# Patient Record
Sex: Male | Born: 1940 | Race: White | Hispanic: No | Marital: Married | Smoking: Never smoker
Health system: Southern US, Community
[De-identification: ages and names within clinical notes are randomized; demographics above are authoritative.]

## PROBLEM LIST (undated history)

## (undated) DIAGNOSIS — J309 Allergic rhinitis, unspecified: Secondary | ICD-10-CM

## (undated) DIAGNOSIS — C4490 Unspecified malignant neoplasm of skin, unspecified: Secondary | ICD-10-CM

## (undated) DIAGNOSIS — M21379 Foot drop, unspecified foot: Secondary | ICD-10-CM

## (undated) DIAGNOSIS — N401 Enlarged prostate with lower urinary tract symptoms: Secondary | ICD-10-CM

## (undated) DIAGNOSIS — K759 Inflammatory liver disease, unspecified: Secondary | ICD-10-CM

## (undated) DIAGNOSIS — R0989 Other specified symptoms and signs involving the circulatory and respiratory systems: Secondary | ICD-10-CM

## (undated) DIAGNOSIS — M549 Dorsalgia, unspecified: Secondary | ICD-10-CM

## (undated) DIAGNOSIS — G479 Sleep disorder, unspecified: Secondary | ICD-10-CM

## (undated) DIAGNOSIS — F528 Other sexual dysfunction not due to a substance or known physiological condition: Secondary | ICD-10-CM

## (undated) DIAGNOSIS — C8590 Non-Hodgkin lymphoma, unspecified, unspecified site: Secondary | ICD-10-CM

## (undated) DIAGNOSIS — Q742 Other congenital malformations of lower limb(s), including pelvic girdle: Secondary | ICD-10-CM

## (undated) DIAGNOSIS — M25569 Pain in unspecified knee: Secondary | ICD-10-CM

## (undated) DIAGNOSIS — K219 Gastro-esophageal reflux disease without esophagitis: Secondary | ICD-10-CM

## (undated) DIAGNOSIS — R351 Nocturia: Secondary | ICD-10-CM

## (undated) DIAGNOSIS — R21 Rash and other nonspecific skin eruption: Secondary | ICD-10-CM

## (undated) HISTORY — DX: Gastro-esophageal reflux disease without esophagitis: K21.9

## (undated) HISTORY — PX: KNEE ARTHROSCOPY: SUR90

## (undated) HISTORY — PX: CATARACT EXTRACTION W/ INTRAOCULAR LENS  IMPLANT, BILATERAL: SHX1307

## (undated) HISTORY — DX: Other congenital malformations of lower limb(s), including pelvic girdle: Q74.2

## (undated) HISTORY — PX: TRANSURETHRAL RESECTION OF PROSTATE: SHX73

## (undated) HISTORY — PX: TONSILLECTOMY AND ADENOIDECTOMY: SUR1326

## (undated) HISTORY — DX: Allergic rhinitis, unspecified: J30.9

## (undated) HISTORY — DX: Pain in unspecified knee: M25.569

## (undated) HISTORY — DX: Other sexual dysfunction not due to a substance or known physiological condition: F52.8

## (undated) HISTORY — DX: Other specified symptoms and signs involving the circulatory and respiratory systems: R09.89

## (undated) HISTORY — DX: Dorsalgia, unspecified: M54.9

---

## 1975-07-07 DIAGNOSIS — K759 Inflammatory liver disease, unspecified: Secondary | ICD-10-CM

## 1975-07-07 HISTORY — DX: Inflammatory liver disease, unspecified: K75.9

## 1997-02-08 ENCOUNTER — Encounter: Payer: Self-pay | Admitting: Gastroenterology

## 1998-02-13 ENCOUNTER — Encounter: Payer: Self-pay | Admitting: Gastroenterology

## 1998-02-25 ENCOUNTER — Encounter: Payer: Self-pay | Admitting: Gastroenterology

## 1998-02-25 ENCOUNTER — Ambulatory Visit (HOSPITAL_COMMUNITY): Admission: RE | Admit: 1998-02-25 | Discharge: 1998-02-25 | Payer: Self-pay | Admitting: *Deleted

## 2001-06-07 DIAGNOSIS — C4491 Basal cell carcinoma of skin, unspecified: Secondary | ICD-10-CM

## 2001-06-07 HISTORY — DX: Basal cell carcinoma of skin, unspecified: C44.91

## 2003-07-31 DIAGNOSIS — C4492 Squamous cell carcinoma of skin, unspecified: Secondary | ICD-10-CM

## 2003-07-31 HISTORY — DX: Squamous cell carcinoma of skin, unspecified: C44.92

## 2004-03-13 ENCOUNTER — Ambulatory Visit (HOSPITAL_COMMUNITY): Admission: RE | Admit: 2004-03-13 | Discharge: 2004-03-13 | Payer: Self-pay | Admitting: Orthopedic Surgery

## 2004-03-13 ENCOUNTER — Ambulatory Visit (HOSPITAL_BASED_OUTPATIENT_CLINIC_OR_DEPARTMENT_OTHER): Admission: RE | Admit: 2004-03-13 | Discharge: 2004-03-13 | Payer: Self-pay | Admitting: Orthopedic Surgery

## 2004-03-13 ENCOUNTER — Observation Stay (HOSPITAL_COMMUNITY): Admission: AD | Admit: 2004-03-13 | Discharge: 2004-03-14 | Payer: Self-pay | Admitting: Orthopedic Surgery

## 2004-08-21 DIAGNOSIS — C4491 Basal cell carcinoma of skin, unspecified: Secondary | ICD-10-CM

## 2004-08-21 HISTORY — DX: Basal cell carcinoma of skin, unspecified: C44.91

## 2005-07-06 HISTORY — PX: SHOULDER OPEN ROTATOR CUFF REPAIR: SHX2407

## 2005-08-11 ENCOUNTER — Encounter: Payer: Self-pay | Admitting: Gastroenterology

## 2005-08-25 ENCOUNTER — Encounter: Payer: Self-pay | Admitting: Gastroenterology

## 2005-10-13 ENCOUNTER — Encounter: Payer: Self-pay | Admitting: Gastroenterology

## 2006-04-23 ENCOUNTER — Ambulatory Visit: Payer: Self-pay | Admitting: Family Medicine

## 2006-04-23 LAB — CONVERTED CEMR LAB
ALT: 22 units/L (ref 0–40)
Chol/HDL Ratio, serum: 5.5
Cholesterol: 208 mg/dL (ref 0–200)
Eosinophil percent: 3.4 % (ref 0.0–5.0)
GFR calc non Af Amer: 72 mL/min
Glomerular Filtration Rate, Af Am: 87 mL/min/{1.73_m2}
HCT: 47 % (ref 39.0–52.0)
HDL: 37.9 mg/dL — ABNORMAL LOW (ref >39.0)
Lymphocytes Relative: 28 % (ref 12.0–46.0)
MCHC: 33 g/dL (ref 30.0–36.0)
Monocytes Relative: 7.4 % (ref 3.0–11.0)
RDW: 12.4 % (ref 11.5–14.6)
Sodium: 142 meq/L (ref 135–145)
TSH: 3.72 microintl units/mL (ref 0.35–5.50)
Triglyceride fasting, serum: 128 mg/dL (ref 0–149)
VLDL: 26 mg/dL (ref 0–40)

## 2006-04-30 ENCOUNTER — Ambulatory Visit: Payer: Self-pay | Admitting: Family Medicine

## 2006-05-18 ENCOUNTER — Ambulatory Visit: Payer: Self-pay

## 2007-01-25 ENCOUNTER — Telehealth (INDEPENDENT_AMBULATORY_CARE_PROVIDER_SITE_OTHER): Payer: Self-pay | Admitting: *Deleted

## 2007-02-18 DIAGNOSIS — K219 Gastro-esophageal reflux disease without esophagitis: Secondary | ICD-10-CM | POA: Insufficient documentation

## 2007-02-18 DIAGNOSIS — R351 Nocturia: Secondary | ICD-10-CM

## 2007-02-18 DIAGNOSIS — N401 Enlarged prostate with lower urinary tract symptoms: Secondary | ICD-10-CM

## 2007-02-18 HISTORY — DX: Gastro-esophageal reflux disease without esophagitis: K21.9

## 2007-02-18 HISTORY — DX: Benign prostatic hyperplasia with lower urinary tract symptoms: N40.1

## 2007-08-03 ENCOUNTER — Ambulatory Visit: Payer: Self-pay | Admitting: Family Medicine

## 2007-08-03 DIAGNOSIS — Q742 Other congenital malformations of lower limb(s), including pelvic girdle: Secondary | ICD-10-CM

## 2007-08-03 DIAGNOSIS — J309 Allergic rhinitis, unspecified: Secondary | ICD-10-CM

## 2007-08-03 DIAGNOSIS — G4720 Circadian rhythm sleep disorder, unspecified type: Secondary | ICD-10-CM | POA: Insufficient documentation

## 2007-08-03 HISTORY — DX: Allergic rhinitis, unspecified: J30.9

## 2007-08-03 HISTORY — DX: Other congenital malformations of lower limb(s), including pelvic girdle: Q74.2

## 2007-08-03 LAB — CONVERTED CEMR LAB
ALT: 21 units/L (ref 0–53)
AST: 19 units/L (ref 0–37)
Alkaline Phosphatase: 59 units/L (ref 39–117)
Basophils Relative: 0.1 % (ref 0.0–1.0)
Chloride: 107 meq/L (ref 96–112)
Cholesterol: 174 mg/dL (ref 0–200)
Creatinine, Ser: 1 mg/dL (ref 0.4–1.5)
Eosinophils Absolute: 0.3 10*3/uL (ref 0.0–0.6)
Eosinophils Relative: 4.6 % (ref 0.0–5.0)
GFR calc non Af Amer: 80 mL/min
HDL: 32.6 mg/dL — ABNORMAL LOW (ref >39.0)
LDL Cholesterol: 122 mg/dL — ABNORMAL HIGH (ref 0–99)
Lymphocytes Relative: 27 % (ref 12.0–46.0)
MCHC: 34.1 g/dL (ref 30.0–36.0)
Monocytes Absolute: 0.4 10*3/uL (ref 0.2–0.7)
Monocytes Relative: 7.6 % (ref 3.0–11.0)
Neutro Abs: 3.4 10*3/uL (ref 1.4–7.7)
Platelets: 119 10*3/uL — ABNORMAL LOW (ref 150–400)
RDW: 12.6 % (ref 11.5–14.6)
Sodium: 142 meq/L (ref 135–145)
TSH: 5.56 microintl units/mL — ABNORMAL HIGH (ref 0.35–5.50)
Total Protein: 5.9 g/dL — ABNORMAL LOW (ref 6.0–8.3)
Triglycerides: 96 mg/dL (ref 0–149)
VLDL: 19 mg/dL (ref 0–40)

## 2007-09-28 ENCOUNTER — Telehealth (INDEPENDENT_AMBULATORY_CARE_PROVIDER_SITE_OTHER): Payer: Self-pay | Admitting: *Deleted

## 2007-11-23 DIAGNOSIS — C4492 Squamous cell carcinoma of skin, unspecified: Secondary | ICD-10-CM

## 2007-11-23 HISTORY — DX: Squamous cell carcinoma of skin, unspecified: C44.92

## 2008-05-08 ENCOUNTER — Telehealth: Payer: Self-pay | Admitting: Family Medicine

## 2008-07-30 ENCOUNTER — Telehealth: Payer: Self-pay | Admitting: Family Medicine

## 2008-08-07 ENCOUNTER — Ambulatory Visit: Payer: Self-pay | Admitting: Family Medicine

## 2008-08-07 DIAGNOSIS — F528 Other sexual dysfunction not due to a substance or known physiological condition: Secondary | ICD-10-CM

## 2008-08-07 DIAGNOSIS — N529 Male erectile dysfunction, unspecified: Secondary | ICD-10-CM | POA: Insufficient documentation

## 2008-08-07 DIAGNOSIS — M25569 Pain in unspecified knee: Secondary | ICD-10-CM

## 2008-08-07 DIAGNOSIS — R0989 Other specified symptoms and signs involving the circulatory and respiratory systems: Secondary | ICD-10-CM

## 2008-08-07 HISTORY — DX: Other specified symptoms and signs involving the circulatory and respiratory systems: R09.89

## 2008-08-07 HISTORY — DX: Other sexual dysfunction not due to a substance or known physiological condition: F52.8

## 2008-08-07 HISTORY — DX: Pain in unspecified knee: M25.569

## 2008-08-17 ENCOUNTER — Ambulatory Visit (HOSPITAL_BASED_OUTPATIENT_CLINIC_OR_DEPARTMENT_OTHER): Admission: RE | Admit: 2008-08-17 | Discharge: 2008-08-17 | Payer: Self-pay | Admitting: Urology

## 2008-08-28 ENCOUNTER — Ambulatory Visit: Payer: Self-pay

## 2009-01-21 ENCOUNTER — Telehealth: Payer: Self-pay | Admitting: Family Medicine

## 2009-04-05 ENCOUNTER — Telehealth (INDEPENDENT_AMBULATORY_CARE_PROVIDER_SITE_OTHER): Payer: Self-pay | Admitting: *Deleted

## 2009-05-28 ENCOUNTER — Telehealth: Payer: Self-pay | Admitting: Family Medicine

## 2009-05-29 ENCOUNTER — Ambulatory Visit: Payer: Self-pay | Admitting: Family Medicine

## 2009-05-29 DIAGNOSIS — J069 Acute upper respiratory infection, unspecified: Secondary | ICD-10-CM | POA: Insufficient documentation

## 2009-07-15 ENCOUNTER — Ambulatory Visit: Payer: Self-pay | Admitting: Family Medicine

## 2009-07-15 DIAGNOSIS — J019 Acute sinusitis, unspecified: Secondary | ICD-10-CM | POA: Insufficient documentation

## 2009-07-17 ENCOUNTER — Telehealth: Payer: Self-pay | Admitting: Family Medicine

## 2009-08-09 ENCOUNTER — Ambulatory Visit: Payer: Self-pay | Admitting: Family Medicine

## 2009-09-18 ENCOUNTER — Ambulatory Visit: Payer: Self-pay | Admitting: Family Medicine

## 2009-09-18 DIAGNOSIS — M549 Dorsalgia, unspecified: Secondary | ICD-10-CM

## 2009-09-18 DIAGNOSIS — R1011 Right upper quadrant pain: Secondary | ICD-10-CM

## 2009-09-18 HISTORY — DX: Dorsalgia, unspecified: M54.9

## 2009-09-23 ENCOUNTER — Encounter: Admission: RE | Admit: 2009-09-23 | Discharge: 2009-09-23 | Payer: Self-pay | Admitting: Family Medicine

## 2009-09-23 ENCOUNTER — Telehealth: Payer: Self-pay | Admitting: *Deleted

## 2009-09-30 ENCOUNTER — Telehealth: Payer: Self-pay | Admitting: Family Medicine

## 2009-10-29 ENCOUNTER — Telehealth: Payer: Self-pay | Admitting: Family Medicine

## 2009-11-11 ENCOUNTER — Ambulatory Visit: Payer: Self-pay | Admitting: Family Medicine

## 2010-02-05 ENCOUNTER — Telehealth: Payer: Self-pay | Admitting: Family Medicine

## 2010-03-21 ENCOUNTER — Telehealth: Payer: Self-pay | Admitting: *Deleted

## 2010-05-01 ENCOUNTER — Telehealth: Payer: Self-pay | Admitting: Family Medicine

## 2010-07-16 ENCOUNTER — Encounter: Payer: Self-pay | Admitting: Family Medicine

## 2010-08-03 LAB — CONVERTED CEMR LAB
ALT: 21 units/L (ref 0–53)
ALT: 24 units/L (ref 0–53)
Albumin: 3.8 g/dL (ref 3.5–5.2)
Alkaline Phosphatase: 57 units/L (ref 39–117)
BUN: 10 mg/dL (ref 6–23)
Basophils Absolute: 0 10*3/uL (ref 0.0–0.1)
CO2: 27 meq/L (ref 19–32)
Calcium: 9.1 mg/dL (ref 8.4–10.5)
Chloride: 107 meq/L (ref 96–112)
Cholesterol: 170 mg/dL (ref 0–200)
Cholesterol: 189 mg/dL (ref 0–200)
Creatinine, Ser: 1.2 mg/dL (ref 0.4–1.5)
Creatinine, Ser: 1.3 mg/dL (ref 0.4–1.5)
Eosinophils Absolute: 0.3 10*3/uL (ref 0.0–0.7)
Eosinophils Relative: 2.8 % (ref 0.0–5.0)
Eosinophils Relative: 6 % — ABNORMAL HIGH (ref 0.0–5.0)
GFR calc Af Amer: 71 mL/min
GFR calc non Af Amer: 59 mL/min
Glucose, Bld: 85 mg/dL (ref 70–99)
HCT: 47.5 % (ref 39.0–52.0)
Hemoglobin: 14.8 g/dL (ref 13.0–17.0)
LDL Cholesterol: 109 mg/dL — ABNORMAL HIGH (ref 0–99)
LDL Cholesterol: 124 mg/dL — ABNORMAL HIGH (ref 0–99)
Lymphs Abs: 1.2 10*3/uL (ref 0.7–4.0)
MCHC: 33.4 g/dL (ref 30.0–36.0)
MCHC: 34 g/dL (ref 30.0–36.0)
Monocytes Absolute: 0.4 10*3/uL (ref 0.1–1.0)
Monocytes Absolute: 0.5 10*3/uL (ref 0.1–1.0)
Monocytes Relative: 9.7 % (ref 3.0–12.0)
Neutro Abs: 2.8 10*3/uL (ref 1.4–7.7)
Neutrophils Relative %: 58.5 % (ref 43.0–77.0)
Neutrophils Relative %: 64.9 % (ref 43.0–77.0)
Platelets: 111 10*3/uL — ABNORMAL LOW (ref 150.0–400.0)
Potassium: 4.5 meq/L (ref 3.5–5.1)
RBC: 4.72 M/uL (ref 4.22–5.81)
RDW: 12 % (ref 11.5–14.6)
RDW: 12.2 % (ref 11.5–14.6)
Sodium: 143 meq/L (ref 135–145)
TSH: 4.43 microintl units/mL (ref 0.35–5.50)
Total Bilirubin: 1 mg/dL (ref 0.3–1.2)
Total Protein: 6.4 g/dL (ref 6.0–8.3)
Triglycerides: 104 mg/dL (ref 0.0–149.0)
WBC: 4.8 10*3/uL (ref 4.5–10.5)
WBC: 5.3 10*3/uL (ref 4.5–10.5)

## 2010-08-07 NOTE — Progress Notes (Signed)
Summary: Pt called Imaging results  Phone Note Call from Patient Call back at Home Phone [phone]   Caller: Patient Summary of Call: Pt called to get results from imaging that he had done. Please call asap.  Initial call taken by: Lucy Antigua,  September 23, 2009 3:58 PM  Follow-up for Phone Call        patient is aware Follow-up by: Kern Reap CMA [name]),  September 24, 2009 2:14 PM    Lumbar arthritis; Abdominal U/S normal

## 2010-08-07 NOTE — Progress Notes (Signed)
  Phone Note Outgoing Call   Summary of Call: I called the coach  Mosely to review the ultrasound pain.  He changed his diet and is relatively asymptomatic.  Advised to continue to avoid fried foods and fatty foods and if he continues to stay in asymptomatic.  Would do nothing further however, if he begins having symptoms will pursue this with HIDA scan.  GI consult, etc. Initial call taken by: Roderick Pee MD,  September 30, 2009 5:37 PM

## 2010-08-07 NOTE — Assessment & Plan Note (Signed)
Summary: back pain/abd pain/dm   Vital Signs:  Patient profile:   70 year old male Weight:      186 pounds Temp:     98.5 degrees F oral BP sitting:   120 / 80  (left arm) Cuff size:   regular  Vitals Entered By: Kern Reap CMA [name]) (September 18, 2009 9:39 AM)  Reason for Visit back pain and upper abd pain  History of Present Illness: Perry Nielsen is a 70 year old, married male, nonsmoker, who comes in today for evaluation of two problems.  Off-and-on for the past 8, years.  He's had right lower lumbar back pain.  He describes the discomfort as dull, comes and goes, it may last for minutes or hours, on a one to 10 scale is a two.  He denies any neurologic symptoms, and no trauma.  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  Otherwise, normal.  He exercises on a regular basis by swimming and playing golf.  If he takes 600 mg and Motrin.  It seems to help.  On occasion, when he swings, a golf club to get severe, sharp pain in his back, which is different from this type of pain.  Is also having some discomfort in his right upper quadrant.  His father and daughter both have had cholecystitis and have had their gallbladders removed.  He feels like this is separate from his back pain.  This is also episodic.  He's had no fever, chills, nausea, vomiting, diarrhea, etc.  Allergies: 1)  * Anthistamines  Past History:  Past medical, surgical, family and social histories (including risk factors) reviewed for relevance to current acute and chronic problems.  Past Medical History: Reviewed history from 08/03/2007 and no changes required. GERD Benign prostatic hypertrophy Pterygium, hx of Decreased hearing loss  rotator cuff surgery, right shoulder.  O7 allergic rhinitis chronic sleep dysfunction hammertoes left foot  Past Surgical History: Reviewed history from 05/29/2009 and no changes required. Rotator cuff repair Left knee signs  cart.  70 T&A Prostatectomy  Family History: Reviewed history from 02/18/2007 and no changes required. Fam hx MI Family History of Aneurysm Aortic Family History of Cardiovascular disorder Family History of Prostate CA 1st degree relative <50  Social History: Reviewed history from 02/18/2007 and no changes required. Retired Never Smoked Alcohol use-yes Drug use-no Regular exercise-yes Married  Review of Systems      See HPI  Physical Exam  General:  Well-developed,well-nourished,in no acute distress; alert,appropriate and cooperative throughout examination Abdomen:  Bowel sounds positive,abdomen soft and non-tender without masses, organomegaly or hernias noted. Msk:  No deformity or scoliosis noted of thoracic or lumbar spine.   Pulses:  R and L carotid,radial,femoral,dorsalis pedis and posterior tibial pulses are full and equal bilaterally Extremities:  No clubbing, cyanosis, edema, or deformity noted with normal full range of motion of all joints.   Neurologic:  No cranial nerve deficits noted. Station and gait are normal. Plantar reflexes are down-going bilaterally. DTRs are symmetrical throughout. Sensory, motor and coordinative functions appear intact.   Problems:  Medical Problems Added: 1)  Dx of Abdominal Pain, Right Upper Quadrant  (ICD-789.01) 2)  Dx of Back Pain  (ICD-724.5)  Impression & Recommendations:  Problem # 1:  ABDOMINAL PAIN, RIGHT UPPER QUADRANT (ICD-789.01) Assessment New  Orders: Radiology Referral (Radiology)  Problem # 2:  BACK PAIN (ICD-724.5) Assessment: Deteriorated  His updated medication list for this problem includes:  Adult Aspirin Ec Low Strength 81 Mg Tbec (Aspirin)  Orders: T-Lumbar Spine w/Flex & Ext 4 Views (16109UE)  Complete Medication List: 1)  Temazepam 30 Mg Caps (Temazepam) .... Take 1 tab by mouth at bedtime 2)  Glucosamine-chondroitin 1500-1200 Mg/52ml Liqd (Glucosamine-chondroitin) 3)  Multivital Tabs (Multiple  vitamins-minerals) 4)  Adult Aspirin Ec Low Strength 81 Mg Tbec (Aspirin) 5)  Fish Oil Oil (Fish oil) 6)  Metamucil 0.52 Gm Caps (Psyllium) .... ????dose 7)  Viagra 50 Mg Tabs (Sildenafil citrate) .... Take one tablet as needed  Patient Instructions: 1)  we will get you set up for an ultrasound of the gallbladder and spine films. 2)  I will call you when I get the reports. 3)  In the meantime, I take 600 mg of Motrin before your exercise and avoid fatty foods

## 2010-08-07 NOTE — Progress Notes (Signed)
Summary: temazepam refill  Phone Note Refill Request Message from:  Fax from Pharmacy on February 05, 2010 12:50 PM  Refills Requested: Medication #1:  TEMAZEPAM 30 MG  CAPS Take 1 tab by mouth at bedtime Initial call taken by: Kern Reap CMA [name]),  February 05, 2010 12:50 PM    Prescriptions: TEMAZEPAM 30 MG  CAPS (TEMAZEPAM) Take 1 tab by mouth at bedtime  #100 x 1   Entered by:   Kern Reap CMA (AAMA)   Authorized by:   Roderick Pee MD   Signed by:   Kern Reap CMA (AAMA) on 02/05/2010   Method used:   Historical   RxID:   [id number]

## 2010-08-07 NOTE — Assessment & Plan Note (Signed)
Summary: FEVER, DIARRHEA // RS   Vital Signs:  Patient profile:   70 year old male Weight:      178 pounds Temp:     97.8 degrees F oral BP sitting:   110 / 80  (left arm) Cuff size:   regular  Vitals Entered By: Kern Reap CMA [name]) (Nov 11, 2009 1:57 PM) CC: fever and flu sx   CC:  fever and flu sx.  History of Present Illness: frank is a 70 year old, married male, nonsmoker, who comes in today for evaluation of fever and diarrhea.  He states he ate at a banquet last Tuesday night Wednesday morning he awoke with fever, chills, and diarrhea.  This lasted for about two days and went away.  He then resumed his regular diet over the past weekend and started having loose bowel movements.  Again.  No vomiting.  Weight loss, 7 pounds  Allergies: 1)  * Anthistamines  Past History:  Past medical, surgical, family and social histories (including risk factors) reviewed for relevance to current acute and chronic problems.  Past Medical History: Reviewed history from 08/03/2007 and no changes required. GERD Benign prostatic hypertrophy Pterygium, hx of Decreased hearing loss  rotator cuff surgery, right shoulder.  O7 allergic rhinitis chronic sleep dysfunction hammertoes left foot  Past Surgical History: Reviewed history from 05/29/2009 and no changes required. Rotator cuff repair Left knee signs  cart. 70 T&A Prostatectomy  Family History: Reviewed history from 02/18/2007 and no changes required. Fam hx MI Family History of Aneurysm Aortic Family History of Cardiovascular disorder Family History of Prostate CA 1st degree relative <50  Social History: Reviewed history from 02/18/2007 and no changes required. Retired Never Smoked Alcohol use-yes Drug use-no Regular exercise-yes Married  Review of Systems      See HPI  Physical Exam  General:  Well-developed,well-nourished,in no acute distress; alert,appropriate and cooperative throughout  examination Abdomen:  the abdomen is flat, without signs of somewhat hyperactive.  No point tenderness.  No rebound   Problems:  Medical Problems Added: 1)  Dx of Diarrhea  (ICD-787.91)  Impression & Recommendations:  Problem # 1:  DIARRHEA (ICD-787.91)  Complete Medication List: 1)  Temazepam 30 Mg Caps (Temazepam) .... Take 1 tab by mouth at bedtime 2)  Glucosamine-chondroitin 1500-1200 Mg/48ml Liqd (Glucosamine-chondroitin) 3)  Multivital Tabs (Multiple vitamins-minerals) 4)  Adult Aspirin Ec Low Strength 81 Mg Tbec (Aspirin) 5)  Fish Oil Oil (Fish oil) 6)  Metamucil 0.52 Gm Caps (Psyllium) .... ????dose 7)  Viagra 50 Mg Tabs (Sildenafil citrate) .... Take one tablet as needed  Patient Instructions: 1)  avoids fat and lack tests return p.r.n.

## 2010-08-07 NOTE — Assessment & Plan Note (Signed)
Summary: pt fasting/cpx/RCD   Vital Signs:  Patient profile:   70 year old male Height:      72 inches Weight:      181 pounds Temp:     98.7 degrees F oral BP sitting:   120 / 84  (left arm) Cuff size:   regular  Vitals Entered By: Perry Nielsen CMA Perry Nielsen) (August 09, 2009 8:27 AM)  Reason for Visit cpx  History of Present Illness: Perry Nielsen is a 70 year old, married male, nonsmoker, who comes in today for physical examination  Is always been in excellent health.  He said no chronic medical problems.  He does have chronic insomnia for which he takes temazepam 30 mg nightly.  He uses Viagra 50 mg p.r.n. for recti dysfunction.  He takes one aspirin tablet daily, and Motrin 400 mg before playing golf.  He gets routine eye care.  Dental care.  Colonoscopy done 4 years ago, normal except for one polyp.  Tetanus 2005 two 2010 Pneumovax 2007.  Review of systems negative.  He continues to stay physically active in his retirement playing golf and swimming.  He recently saw his urologist.  Exam and PSA were normal.  PSA 2.31  Allergies: 1)  * Anthistamines  Past History:  Past medical, surgical, family and social histories (including risk factors) reviewed, and no changes noted (except as noted below).  Past Medical History: Reviewed history from 08/03/2007 and no changes required. GERD Benign prostatic hypertrophy Pterygium, hx of Decreased hearing loss  rotator cuff surgery, right shoulder.  O7 allergic rhinitis chronic sleep dysfunction hammertoes left foot  Past Surgical History: Reviewed history from 05/29/2009 and no changes required. Rotator cuff repair Left knee signs  cart. 70 T&A Prostatectomy  Family History: Reviewed history from 02/18/2007 and no changes required. Fam hx MI Family History of Aneurysm Aortic Family History of Cardiovascular disorder Family History of Prostate CA 1st degree relative <50  Social History: Reviewed history from  02/18/2007 and no changes required. Retired Never Smoked Alcohol use-yes Drug use-no Regular exercise-yes Married  Review of Systems      See HPI  Physical Exam  General:  Well-developed,well-nourished,in no acute distress; alert,appropriate and cooperative throughout examination Head:  Normocephalic and atraumatic without obvious abnormalities. No apparent alopecia or balding. Eyes:  No corneal or conjunctival inflammation noted. EOMI. Perrla. Funduscopic exam benign, without hemorrhages, exudates or papilledema. Vision grossly normal. Ears:  External ear exam shows no significant lesions or deformities.  Otoscopic examination reveals clear canals, tympanic membranes are intact bilaterally without bulging, retraction, inflammation or discharge. Hearing is grossly normal bilaterally. Nose:  External nasal examination shows no deformity or inflammation. Nasal mucosa are pink and moist without lesions or exudates. Mouth:  Oral mucosa and oropharynx without lesions or exudates.  Teeth in good repair. Neck:  No deformities, masses, or tenderness noted.soft left carotid bruit Chest Wall:  No deformities, masses, tenderness or gynecomastia noted. Breasts:  No masses or gynecomastia noted Lungs:  Normal respiratory effort, chest expands symmetrically. Lungs are clear to auscultation, no crackles or wheezes. Heart:  Normal rate and regular rhythm. S1 and S2 normal without gallop, murmur, click, rub or other extra sounds. Abdomen:  Bowel sounds positive,abdomen soft and non-tender without masses, organomegaly or hernias noted. Rectal:  uro Genitalia:  Testes bilaterally descended without nodularity, tenderness or masses. No scrotal masses or lesions. No penis lesions or urethral discharge. Prostate:  uro Msk:  No deformity or scoliosis noted of thoracic or lumbar spine.  Pulses:  R and L carotid,radial,femoral,dorsalis pedis and posterior tibial pulses are full and equal  bilaterally Extremities:  No clubbing, cyanosis, edema, or deformity noted with normal full range of motion of all joints.   Neurologic:  No cranial nerve deficits noted. Station and gait are normal. Plantar reflexes are down-going bilaterally. DTRs are symmetrical throughout. Sensory, motor and coordinative functions appear intact. Skin:  Intact without suspicious lesions or rashes Cervical Nodes:  No lymphadenopathy noted Axillary Nodes:  No palpable lymphadenopathy Inguinal Nodes:  No significant adenopathy Psych:  Cognition and judgment appear intact. Alert and cooperative with normal attention span and concentration. 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 Take one tablet as needed  Orders: Venipuncture (78295) TLB-Lipid Panel (80061-LIPID) TLB-BMP (Basic Metabolic Panel-BMET) (80048-METABOL) TLB-CBC Platelet - w/Differential (85025-CBCD) TLB-Hepatic/Liver Function Pnl (80076-HEPATIC) TLB-TSH (Thyroid Stimulating Hormone) (62130-QMV) Prescription Created Electronically [phone])  Problem # 2:  CAROTID BRUIT, LEFT (ICD-785.9) Assessment: Unchanged  Orders: Venipuncture (62952) TLB-Lipid Panel (80061-LIPID) TLB-BMP (Basic Metabolic Panel-BMET) (80048-METABOL) TLB-CBC Platelet - w/Differential (85025-CBCD) TLB-Hepatic/Liver Function Pnl (80076-HEPATIC) TLB-TSH (Thyroid Stimulating Hormone) (84132-GMW) Prescription Created Electronically [phone]) EKG w/ Interpretation (93000)  Problem # 3:  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 Take 1 tab by mouth at bedtime 2)  Glucosamine-chondroitin 1500-1200 Mg/67ml Liqd (Glucosamine-chondroitin) 3)  Multivital Tabs (Multiple vitamins-minerals) 4)  Adult Aspirin Ec Low Strength 81 Mg Tbec (Aspirin) 5)  Fish Oil Oil (Fish oil) 6)  Metamucil 0.52 Gm Caps (Psyllium) .... ????dose 7)  Viagra 50 Mg Tabs (Sildenafil citrate) .... Take one tablet as needed  Patient Instructions: 1)  Please schedule a follow-up appointment in 1 year. 2)  It is important that you exercise regularly at least 20 minutes 5 times a week. If you develop chest pain, have severe difficulty breathing, or feel very tired , stop exercising immediately and seek medical attention. 3)  Take an Aspirin every day. Prescriptions: TEMAZEPAM 30 MG  CAPS (TEMAZEPAM) Take 1 tab by mouth at bedtime  #100 x 3   Entered and Authorized by:   Roderick Pee MD   Signed by:   Roderick Pee MD on 08/09/2009   Method used:   Print then Give to Patient   RxID:   [id number] VIAGRA 50 MG  TABS (SILDENAFIL CITRATE) take one tablet as needed  #6 x 11   Entered and Authorized by:   Roderick Pee MD   Signed by:   Roderick Pee MD on 08/09/2009   Method used:   Electronically to        [name]* (retail)       [address]       [place], [place]  [address]       Ph: [phone] or [phone]       Fax: [phone]   RxID:   [id number]    Immunization History:  Influenza Immunization History:    Influenza:  historical (04/05/2009)  Pneumovax Immunization History:    Pneumovax:  historical (  04/05/2008)    Pneumovax:  historical (04/05/2006)

## 2010-08-07 NOTE — Letter (Signed)
Summary: [facility]  [facility]   Imported By: Maryln Gottron 07/23/2010 09:41:26  _____________________________________________________________________  External Attachment:    Type:   Image     Comment:   External Document

## 2010-08-07 NOTE — Assessment & Plan Note (Signed)
Summary: sinus/dm   Vital Signs:  Patient profile:   70 year old male Weight:      187 pounds Temp:     97.9 degrees F BP sitting:   130 / 90  (left arm) Cuff size:   regular  Vitals Entered By: Kern Reap CMA [name]) (July 15, 2009 12:10 PM)  Reason for Visit sinus concerns   History of Present Illness: Perry Nielsen is a 70 year old, married male, nonsmoker former football Psychologist, occupational at [employer], who comes in today with a two week history of head congestion, sore throat, and now severe right frontal facial pain x 2 days.  Review of systems negative except for history of allergic rhinitis  Allergies: 1)  * Anthistamines  Past History:  Past medical, surgical, family and social histories (including risk factors) reviewed for relevance to current acute and chronic problems.  Past Medical History: Reviewed history from 08/03/2007 and no changes required. GERD Benign prostatic hypertrophy Pterygium, hx of Decreased hearing loss  rotator cuff surgery, right shoulder.  O7 allergic rhinitis chronic sleep dysfunction hammertoes left foot  Past Surgical History: Reviewed history from 05/29/2009 and no changes required. Rotator cuff repair Left knee signs  cart. 70 T&A Prostatectomy  Family History: Reviewed history from 02/18/2007 and no changes required. Fam hx MI Family History of Aneurysm Aortic Family History of Cardiovascular disorder Family History of Prostate CA 1st degree relative <50  Social History: Reviewed history from 02/18/2007 and no changes required. Retired Never Smoked Alcohol use-yes Drug use-no Regular exercise-yes Married  Review of Systems      See HPI  Physical Exam  General:  Well-developed,well-nourished,in no acute distress; alert,appropriate and cooperative throughout examination Head:  Normocephalic and atraumatic without obvious abnormalities. No apparent alopecia or balding. Eyes:  No corneal or conjunctival inflammation  noted. EOMI. Perrla. Funduscopic exam benign, without hemorrhages, exudates or papilledema. Vision grossly normal. Ears:  External ear exam shows no significant lesions or deformities.  Otoscopic examination reveals clear canals, tympanic membranes are intact bilaterally without bulging, retraction, inflammation or discharge. Hearing is grossly normal bilaterally. Nose:  External nasal examination shows no deformity or inflammation. Nasal mucosa are pink and moist without lesions or exudates. Mouth:  Oral mucosa and oropharynx without lesions or exudates.  Teeth in good repair. Neck:  No deformities, masses, or tenderness noted. Chest Wall:  No deformities, masses, tenderness or gynecomastia noted. Lungs:  Normal respiratory effort, chest expands symmetrically. Lungs are clear to auscultation, no crackles or wheezes.   Problems:  Medical Problems Added: 1)  Dx of Sinusitis- Acute-nos  (ICD-461.9)  Impression & Recommendations:  Problem # 1:  SINUSITIS- ACUTE-NOS (ICD-461.9) Assessment New  His updated medication list for this problem includes:    Hydromet 5-1.5 Mg/17ml Syrp (Hydrocodone-homatropine) .Marland Kitchen... 1 or 2 tsps at bedtime as needed    Amoxicillin 500 Mg Caps (Amoxicillin) .Marland Kitchen... Take 1 tablet by mouth three times a day  Orders: Prescription Created Electronically [phone])  Complete Medication List: 1)  Temazepam 30 Mg Caps (Temazepam) .... Take 1 tab by mouth at bedtime 2)  Glucosamine-chondroitin 1500-1200 Mg/26ml Liqd (Glucosamine-chondroitin) 3)  Multivital Tabs (Multiple vitamins-minerals) 4)  Adult Aspirin Ec Low Strength 81 Mg Tbec (Aspirin) 5)  Fish Oil Oil (Fish oil) 6)  Metamucil 0.52 Gm Caps (Psyllium) .... ????dose 7)  Viagra 50 Mg Tabs (Sildenafil citrate) .... Take one tablet as needed 8)  Hydromet 5-1.5 Mg/4ml Syrp (Hydrocodone-homatropine) .Marland Kitchen.. 1 or 2 tsps at bedtime as needed 9)  Amoxicillin 500  Mg Caps (Amoxicillin) .... Take 1 tablet by mouth three times a  day  Patient Instructions: 1)  drink 30 ounces of water daily, and run a vaporizer or humidifier in y  bedroom at night. 2)  Begin amoxicillin, 500  mg 3 times a day for 10 days.. 3)  at bedtime, use a combination of afrin nasal spray and warm salt water irrigations with a netti pot.  After 5 nights stop the Afrin, but continue the saltwater irrigations until you are well Prescriptions: AMOXICILLIN 500 MG CAPS (AMOXICILLIN) Take 1 tablet by mouth three times a day  #30 x 1   Entered and Authorized by:   Roderick Pee MD   Signed by:   Roderick Pee MD on 07/15/2009   Method used:   Electronically to        [name] (retail)       [address]       [place], [place]  [address]       Ph: [phone] or [phone]       Fax: [phone]   RxID:   [id number]

## 2010-08-07 NOTE — Progress Notes (Signed)
Summary: tick bite  Phone Note Call from Patient   Summary of Call: Pt took 4 ticks off today.  He had been fishing.  Any reason to be concerned?   [phone] Initial call taken by: Lynann Beaver CMA,  October 29, 2009 12:51 PM  Follow-up for Phone Call        mark your calendar........ if at any time in the next two weeks.u start running fever...Marland KitchenMarland KitchenMarland Kitchen  Call immediately to get started on doxycycline Follow-up by: Roderick Pee MD,  October 29, 2009 12:55 PM  Additional Follow-up for Phone Call Additional follow up Details #1::        [name] wife notified of Dr. Nelida Meuse suggestions. Additional Follow-up by: Lynann Beaver CMA,  October 29, 2009 2:05 PM

## 2010-08-07 NOTE — Progress Notes (Signed)
Summary: Pt req refill of Hydromet 5-1.5mg   Phone Note Refill Request Call back at Home Phone [phone]   Refills Requested: Medication #1:  Hydromet 5-1.5mg /5 MLSyrup   Dosage confirmed as above?Dosage Confirmed   Notes: [facility] [address]. Pls call in to [facility] on [address].    Method Requested: Telephone to Pharmacy Initial call taken by: Lucy Antigua,  March 21, 2010 8:36 AM  Follow-up for Phone Call        Hydromet dispense 4 ounces directions one half to 1 teaspoon nightly p.r.n. for cough and cold one refill Follow-up by: Roderick Pee MD,  March 21, 2010 1:21 PM  Additional Follow-up for Phone Call Additional follow up Details #1::        Pt called to ck on status of Rx..... Pt adv that msg would be passed along to Nurse.  Additional Follow-up by: Debbra Riding,  March 21, 2010 3:46 PM    Additional Follow-up for Phone Call Additional follow up Details #2::    Faxed to [facility] [place] Follow-up by: Kathrynn Speed CMA,  March 21, 2010 3:57 PM  New/Updated Medications: HYDROMET 5-1.5 MG/5ML SYRP (HYDROCODONE-HOMATROPINE) One half to 1 teaspoon nightly as needed for cough & cold

## 2010-08-07 NOTE — Progress Notes (Signed)
Summary: refill  Phone Note Call from Patient Call back at Home Phone [phone]   Caller: Patient-live call Reason for Call: Refill Medication Complaint: Cough/Sore throat Summary of Call: Refill Lorazepam-[facility] [place]. pt is out of meds. just saw dr Tawanna Cooler Duanne Limerick.  Initial call taken by: Warnell Forester,  July 17, 2009 4:09 PM  Follow-up for Phone Call        called in 30 tempazepam no ref.set up cpx.w/in 30 days Follow-up by: Roderick Pee MD,  July 17, 2009 5:11 PM     Appended Document: refill    Clinical Lists Changes  Medications: Rx of TEMAZEPAM 30 MG  CAPS (TEMAZEPAM) Take 1 tab by mouth at bedtime;  #30 x 0;  Signed;  Entered by: Doristine Devoid;  Authorized by: Roderick Pee MD;  Method used: Historical    Prescriptions: TEMAZEPAM 30 MG  CAPS (TEMAZEPAM) Take 1 tab by mouth at bedtime  #30 x 0   Entered by:   Doristine Devoid   Authorized by:   Roderick Pee MD   Signed by:   Doristine Devoid on 07/18/2009   Method used:   Historical   RxID:   [id number]

## 2010-08-07 NOTE — Progress Notes (Signed)
Summary: refill  Phone Note Refill Request Message from:  Fax from Pharmacy on May 01, 2010 2:31 PM  Refills Requested: Medication #1:  TEMAZEPAM 30 MG  CAPS Take 1 tab by mouth at bedtime  Method Requested: Electronic Initial call taken by: Kern Reap CMA [name]),  May 01, 2010 2:31 PM    Prescriptions: TEMAZEPAM 30 MG  CAPS (TEMAZEPAM) Take 1 tab by mouth at bedtime  #100 x 2   Entered by:   Kern Reap CMA (AAMA)   Authorized by:   Roderick Pee MD   Signed by:   Kern Reap CMA (AAMA) on 05/01/2010   Method used:   Historical   RxID:   [id number]

## 2010-08-13 ENCOUNTER — Encounter: Payer: Self-pay | Admitting: Family Medicine

## 2010-08-14 ENCOUNTER — Ambulatory Visit (INDEPENDENT_AMBULATORY_CARE_PROVIDER_SITE_OTHER): Payer: Medicare Other | Admitting: Family Medicine

## 2010-08-14 ENCOUNTER — Encounter: Payer: Self-pay | Admitting: Family Medicine

## 2010-08-14 DIAGNOSIS — G4720 Circadian rhythm sleep disorder, unspecified type: Secondary | ICD-10-CM

## 2010-08-14 DIAGNOSIS — R5381 Other malaise: Secondary | ICD-10-CM

## 2010-08-14 DIAGNOSIS — Z136 Encounter for screening for cardiovascular disorders: Secondary | ICD-10-CM

## 2010-08-14 DIAGNOSIS — R5383 Other fatigue: Secondary | ICD-10-CM

## 2010-08-14 DIAGNOSIS — R0989 Other specified symptoms and signs involving the circulatory and respiratory systems: Secondary | ICD-10-CM

## 2010-08-14 DIAGNOSIS — K635 Polyp of colon: Secondary | ICD-10-CM

## 2010-08-14 DIAGNOSIS — N400 Enlarged prostate without lower urinary tract symptoms: Secondary | ICD-10-CM

## 2010-08-14 DIAGNOSIS — Z0000 Encounter for general adult medical examination without abnormal findings: Secondary | ICD-10-CM

## 2010-08-14 DIAGNOSIS — F528 Other sexual dysfunction not due to a substance or known physiological condition: Secondary | ICD-10-CM

## 2010-08-14 DIAGNOSIS — I10 Essential (primary) hypertension: Secondary | ICD-10-CM

## 2010-08-14 LAB — BASIC METABOLIC PANEL
CO2: 30 mEq/L (ref 19–32)
Calcium: 8.6 mg/dL (ref 8.4–10.5)
GFR: 77.84 mL/min (ref >60.00)
Glucose, Bld: 83 mg/dL (ref 70–99)
Potassium: 4.6 mEq/L (ref 3.5–5.1)
Sodium: 138 mEq/L (ref 135–145)

## 2010-08-14 LAB — POCT URINALYSIS DIPSTICK
Bilirubin, UA: NEGATIVE
Glucose, UA: NEGATIVE
Leukocytes, UA: NEGATIVE
Nitrite, UA: NEGATIVE
pH, UA: 6.5

## 2010-08-14 LAB — CBC WITH DIFFERENTIAL/PLATELET
Basophils Absolute: 0 10*3/uL (ref 0.0–0.1)
Eosinophils Relative: 4.2 % (ref 0.0–5.0)
Lymphocytes Relative: 25.7 % (ref 12.0–46.0)
Monocytes Relative: 9.9 % (ref 3.0–12.0)
Neutrophils Relative %: 60.1 % (ref 43.0–77.0)
Platelets: 104 10*3/uL — ABNORMAL LOW (ref 150.0–400.0)
RDW: 13.6 % (ref 11.5–14.6)
WBC: 5.5 10*3/uL (ref 4.5–10.5)

## 2010-08-14 LAB — HEPATIC FUNCTION PANEL
AST: 27 U/L (ref 0–37)
Albumin: 3.6 g/dL (ref 3.5–5.2)
Alkaline Phosphatase: 63 U/L (ref 39–117)
Bilirubin, Direct: 0.1 mg/dL (ref 0.0–0.3)
Total Protein: 6.1 g/dL (ref 6.0–8.3)

## 2010-08-14 MED ORDER — SILDENAFIL CITRATE 50 MG PO TABS: 50.0000 mg | ORAL_TABLET | Freq: Every day | ORAL | Status: DC | PRN

## 2010-08-14 MED ORDER — TEMAZEPAM 30 MG PO CAPS: 30.0000 mg | ORAL_CAPSULE | Freq: Every evening | ORAL | Status: DC | PRN

## 2010-08-14 NOTE — Patient Instructions (Signed)
Continued good health habits return in one year for physical exam

## 2010-08-14 NOTE — Progress Notes (Signed)
  Subjective:    Patient ID: Perry Nielsen, male    DOB: 20-Oct-1940, 70 y.o.   MRN: [id number]  HPI Perry Nielsen is a 70 year old, married man nonsmoker retired [employer] manager  at [employer] Who comes in today for his annual physical examination because of a history of sleep dysfunction, chronic, erectile dysfunction, mild BPH, and general medical exam.  He only has nocturia x 1.  He saw his urologist this year.  His urologist recommends that he come see [place] for follow-up in the future.  His PSA this year was normal.  He also uses thiamine 50 mg p.r.n. For ED, the medications, working well.  No side effects.  He has chronic sleep dysfunction, for which he takes temazepam 30 mg nightly.     Review of Systems    Total review of systems negative Objective:   Physical Exam    He is a well-developed, well-nourished, male in no acute distress.  Examination of the HEENT was negative.  The neck was supple.  Thyroid not enlarged.  No carotid bruits.  Chest is clear to auscultation.  Cardiac exam negative.  Abdominal exam negative.  Genitalia.  Rectal done by urologist now for not repeated.  Extremities normal.  Skin normal.  Peripheral pulses normal    Assessment & Plan:

## 2010-08-14 NOTE — Assessment & Plan Note (Signed)
Continue your current medication.  Return p.r.n. 

## 2010-08-15 ENCOUNTER — Other Ambulatory Visit: Payer: Self-pay | Admitting: Family Medicine

## 2010-08-15 DIAGNOSIS — G4720 Circadian rhythm sleep disorder, unspecified type: Secondary | ICD-10-CM

## 2010-08-15 DIAGNOSIS — N529 Male erectile dysfunction, unspecified: Secondary | ICD-10-CM

## 2010-08-15 LAB — LIPID PANEL: Total CHOL/HDL Ratio: 5

## 2010-08-15 MED ORDER — SILDENAFIL CITRATE 50 MG PO TABS: 50.0000 mg | ORAL_TABLET | Freq: Every evening | ORAL | Status: DC | PRN

## 2010-08-15 MED ORDER — TEMAZEPAM 30 MG PO CAPS: 30.0000 mg | ORAL_CAPSULE | Freq: Every evening | ORAL | Status: DC | PRN

## 2010-08-18 ENCOUNTER — Encounter (INDEPENDENT_AMBULATORY_CARE_PROVIDER_SITE_OTHER): Payer: Self-pay | Admitting: *Deleted

## 2010-08-19 ENCOUNTER — Encounter (INDEPENDENT_AMBULATORY_CARE_PROVIDER_SITE_OTHER): Payer: Self-pay | Admitting: *Deleted

## 2010-08-20 ENCOUNTER — Encounter: Payer: Self-pay | Admitting: Gastroenterology

## 2010-08-22 ENCOUNTER — Ambulatory Visit (INDEPENDENT_AMBULATORY_CARE_PROVIDER_SITE_OTHER): Payer: Medicare Other | Admitting: Family Medicine

## 2010-08-22 DIAGNOSIS — Z2911 Encounter for prophylactic immunotherapy for respiratory syncytial virus (RSV): Secondary | ICD-10-CM

## 2010-08-22 DIAGNOSIS — Z0000 Encounter for general adult medical examination without abnormal findings: Secondary | ICD-10-CM

## 2010-08-27 NOTE — Letter (Signed)
Summary: Pre Visit Letter Revised  [facility]  [address] [place], [place] [address]   Phone: [phone]  Fax: [phone]        08/18/2010 MRN: [id number] Perry Nielsen [address] [place], [place]  [address]  [place]             Procedure Date:  09-04-10  Welcome to the Gastroenterology Division at [facility].    You are scheduled to see a nurse for your pre-procedure visit on 08-22-10 at 2:00P.M. on the 3rd floor at [facility], [address]  We ask that you try to arrive at our office 15 minutes prior to your appointment time to allow for check-in.  Please take a minute to review the attached form.  If you answer "Yes" to one or more of the questions on the first page, we ask that you call the person listed at your earliest opportunity.  If you answer "No" to all of the questions, please complete the rest of the form and bring it to your appointment.    Your nurse visit will consist of discussing your medical and surgical history, your immediate family medical history, and your medications.   If you are unable to list all of your medications on the form, please bring the medication bottles to your appointment and we will list them.  We will need to be aware of both prescribed and over the counter drugs.  We will need to know exact dosage information as well.    Please be prepared to read and sign documents such as consent forms, a financial agreement, and acknowledgement forms.  If necessary, and with your consent, a friend or relative is welcome to sit-in on the nurse visit with you.  Please bring your insurance card so that we may make a copy of it.  If your insurance requires a referral to see a specialist, please bring your referral form from your primary care physician.  No co-pay is required for this nurse visit.     If you cannot keep your appointment, please call [phone] to cancel or reschedule prior to your appointment date.  This  allows [place] the opportunity to schedule an appointment for another patient in need of care.    Thank you for choosing [facility] for your medical needs.  We appreciate the opportunity to care for you.  Please visit [place] at our website  to learn more about our practice.  Sincerely, The Gastroenterology Division

## 2010-08-27 NOTE — Miscellaneous (Signed)
Summary: [facility] Previsit/prep.  Clinical Lists Changes  Medications: Added new medication of MOVIPREP 100 GM  SOLR (PEG-KCL-NACL-NASULF-NA ASC-C) As per prep instructions. - Signed Rx of MOVIPREP 100 GM  SOLR (PEG-KCL-NACL-NASULF-NA ASC-C) As per prep instructions.;  #1 x 0;  Signed;  Entered by: Wyona Almas RN;  Authorized by: Louis Meckel MD;  Method used: Electronically to [facility]*, [address] [place], [place]  [address], Ph: [phone], Fax: [phone] Observations: Added new observation of ALLERGY REV: Done (08/20/2010 8:06)    Prescriptions: MOVIPREP 100 GM  SOLR (PEG-KCL-NACL-NASULF-NA ASC-C) As per prep instructions.  #1 x 0   Entered by:   Wyona Almas RN   Authorized by:   Louis Meckel MD   Signed by:   Wyona Almas RN on 08/20/2010   Method used:   Electronically to        [facility]* (retail)       [address]       [place], [place]  [address]       Ph: [phone]       Fax: [phone]   RxID:   [id number]

## 2010-08-27 NOTE — Letter (Signed)
Summary: [facility] Instructions  Perry Nielsen  [address] [place], [place] [address]   Phone: [phone]  Fax: [phone]       Perry Nielsen    12-20-1940    MRN: [id number]        Procedure Day Dorna Bloom:  Lenor Coffin  09/04/10     Arrival Time:  9:00AM     Procedure Time:  10:00AM     Location of Procedure:                    Juliann Pares _  [facility] (4th Floor)                      PREPARATION FOR COLONOSCOPY WITH MOVIPREP   Starting 5 days prior to your procedure 08/30/10 do not eat nuts, seeds, popcorn, corn, beans, peas,  salads, or any raw vegetables.  Do not take any fiber supplements (e.g. Metamucil, Citrucel, and Benefiber).  THE DAY BEFORE YOUR PROCEDURE         DATE: 09/03/10  DAY: WEDNESDAY  1.  Drink clear liquids the entire day-NO SOLID FOOD  2.  Do not drink anything colored red or purple.  Avoid juices with pulp.  No orange juice.  3.  Drink at least 64 oz. (8 glasses) of fluid/clear liquids during the day to prevent dehydration and help the prep work efficiently.  CLEAR LIQUIDS INCLUDE: Water Jello Ice Popsicles Tea (sugar ok, no milk/cream) Powdered fruit flavored drinks Coffee (sugar ok, no milk/cream) Gatorade Juice: apple, white grape, white cranberry  Lemonade Clear bullion, consomm, broth Carbonated beverages (any kind) Strained chicken noodle soup Hard Candy                             4.  In the morning, mix first dose of MoviPrep solution:    Empty 1 Pouch A and 1 Pouch B into the disposable container    Add lukewarm drinking water to the top line of the container. Mix to dissolve    Refrigerate (mixed solution should be used within 24 hrs)  5.  Begin drinking the prep at 5:00 p.m. The MoviPrep container is divided by 4 marks.   Every 15 minutes drink the solution down to the next mark (approximately 8 oz) until the full liter is complete.   6.  Follow completed prep with 16 oz of clear liquid of your choice  (Nothing red or purple).  Continue to drink clear liquids until bedtime.  7.  Before going to bed, mix second dose of MoviPrep solution:    Empty 1 Pouch A and 1 Pouch B into the disposable container    Add lukewarm drinking water to the top line of the container. Mix to dissolve    Refrigerate  THE DAY OF YOUR PROCEDURE      DATE: 09/04/10   DAY: THURSDAY  Beginning at 5:00AM (5 hours before procedure):         1. Every 15 minutes, drink the solution down to the next mark (approx 8 oz) until the full liter is complete.  2. Follow completed prep with 16 oz. of clear liquid of your choice.    3. You may drink clear liquids until 8:00AM (2 HOURS BEFORE PROCEDURE).   MEDICATION INSTRUCTIONS  Unless otherwise instructed, you should take regular prescription medications with a small sip of water   as early as possible the morning of  your procedure.         OTHER INSTRUCTIONS  You will need a responsible adult at least 69 years of age to accompany you and drive you home.   This person must remain in the waiting room during your procedure.  Wear loose fitting clothing that is easily removed.  Leave jewelry and other valuables at home.  However, you may wish to bring a book to read or  an iPod/MP3 player to listen to music as you wait for your procedure to start.  Remove all body piercing jewelry and leave at home.  Total time from sign-in until discharge is approximately 2-3 hours.  You should go home directly after your procedure and rest.  You can resume normal activities the  day after your procedure.  The day of your procedure you should not:   Drive   Make legal decisions   Operate machinery   Drink alcohol   Return to work  You will receive specific instructions about eating, activities and medications before you leave.    The above instructions have been reviewed and explained to me by   Wyona Almas RN  August 20, 2010 8:41 AM     I fully  understand and can verbalize these instructions _____________________________ Date _________

## 2010-09-04 ENCOUNTER — Other Ambulatory Visit: Payer: Self-pay | Admitting: Gastroenterology

## 2010-09-04 ENCOUNTER — Other Ambulatory Visit (AMBULATORY_SURGERY_CENTER): Payer: Medicare Other | Admitting: Gastroenterology

## 2010-09-04 DIAGNOSIS — D126 Benign neoplasm of colon, unspecified: Secondary | ICD-10-CM

## 2010-09-04 DIAGNOSIS — Z86010 Personal history of colonic polyps: Secondary | ICD-10-CM

## 2010-09-04 DIAGNOSIS — K5730 Diverticulosis of large intestine without perforation or abscess without bleeding: Secondary | ICD-10-CM

## 2010-09-04 DIAGNOSIS — Z1211 Encounter for screening for malignant neoplasm of colon: Secondary | ICD-10-CM

## 2010-09-11 ENCOUNTER — Encounter: Payer: Self-pay | Admitting: Gastroenterology

## 2010-09-11 NOTE — Procedures (Addendum)
Summary: Colonoscopy  Patient: Perry Nielsen Note: All result statuses are Final unless otherwise noted.  Tests: (1) Colonoscopy (COL)   COL Colonoscopy           DONE     [facility]     [address]     [place], [place]  [address]          COLONOSCOPY PROCEDURE REPORT          PATIENT:  Perry Nielsen, Perry Nielsen  MR#:  [id number]     BIRTHDATE:  [birth year], 70 yrs. old  GENDER:  male          ENDOSCOPIST:  Barbette Hair. Arlyce Dice, MD     Referred by:  Eugenio Hoes Tawanna Cooler, M.D.          PROCEDURE DATE:  09/04/2010     PROCEDURE:  Colonoscopy with snare polypectomy, Colon with cold     biopsy polypectomy     ASA CLASS:  Class II     INDICATIONS:  1) screening  2) history of pre-cancerous     (adenomatous) colon polyps remote h/o polyps          MEDICATIONS:   Fentanyl 75 mcg IV, Versed 6 mg IV          DESCRIPTION OF PROCEDURE:   After the risks benefits and     alternatives of the procedure were thoroughly explained, informed     consent was obtained.  Digital rectal exam was performed and     revealed no abnormalities.   The LB 180AL [id number] endoscope was     introduced through the anus and advanced to the cecum, which was     identified by both the appendix and ileocecal valve, without     limitations.  The quality of the prep was excellent, using     MoviPrep.  The instrument was then slowly withdrawn as the colon     was fully examined.     <<PROCEDUREIMAGES>>          FINDINGS:  A diminutive polyp was found in the ascending colon. It     was 2 mm in size. The polyp was removed using cold biopsy forceps     (see image5).  A sessile polyp was found in the descending colon.     It was 3 mm in size. Polyp was snared without cautery. Retrieval     was successful (see image10). snare polyp  Mild diverticulosis was     found in the sigmoid to descending colon segments (see image12).     This was otherwise a normal examination of the colon (see image2,     image3, image6,  image8, image13, and image14).   Retroflexed views     in the rectum revealed no abnormalities.    The time to cecum =     2.50  minutes. The scope was then withdrawn (time =  8.25  min)     from the patient and the procedure completed.          COMPLICATIONS:  None          ENDOSCOPIC IMPRESSION:     1) 2 mm diminutive polyp in the ascending colon     2) 3 mm sessile polyp in the descending colon     3) Mild diverticulosis in the sigmoid to descending colon     segments     4) Otherwise normal examination     RECOMMENDATIONS:     1)  If the polyp(s) removed today are proven to be adenomatous     (pre-cancerous) polyps, you will need a repeat colonoscopy in 5     years. Otherwise you should continue to follow colorectal cancer     screening guidelines for "routine risk" patients with colonoscopy     in 10 years.          REPEAT EXAM:   You will receive a letter from Dr. Arlyce Dice in 1-2     weeks, after reviewing the final pathology, with followup     recommendations.          ______________________________     Barbette Hair Arlyce Dice, MD          CC:          n.     eSIGNED:   Barbette Hair. Myrene Bougher at 09/04/2010 11:20 AM          Ricard Dillon, [id number]  Note: An exclamation mark (!) indicates a result that was not dispersed into the flowsheet. Document Creation Date: 09/04/2010 11:20 AM _______________________________________________________________________  (1) Order result status: Final Collection or observation date-time: 09/04/2010 11:09 Requested date-time:  Receipt date-time:  Reported date-time:  Referring Physician:   Ordering Physician: Melvia Heaps [phone]) Specimen Source:  Source: Launa Grill Order Number: [id number] Lab site:   Appended Document: Colonoscopy     Procedures Next Due Date:    Colonoscopy: 09/2015

## 2010-09-16 NOTE — Letter (Signed)
Summary: Patient Notice- Polyp Results  [facility]  [address] [place], [place] [address]   Phone: [phone]  Fax: [phone]        September 11, 2010 MRN: [id number]    Perry Nielsen [address] [place], [place]  [address]    Dear Mr. Loftin,  I am pleased to inform you that the colon polyp(s) removed during your recent colonoscopy was (were) found to be benign (no cancer detected) upon pathologic examination.  I recommend you have a repeat colonoscopy examination in _5 years to look for recurrent polyps, as having colon polyps increases your risk for having recurrent polyps or even colon cancer in the future.  Should you develop new or worsening symptoms of abdominal pain, bowel habit changes or bleeding from the rectum or bowels, please schedule an evaluation with either your primary care physician or with me.  Additional information/recommendations:  __ No further action with gastroenterology is needed at this time. Please      follow-up with your primary care physician for your other healthcare      needs.  __ Please call [phone] to schedule a return visit to review your      situation.  __ Please keep your follow-up visit as already scheduled.  _x_ Continue treatment plan as outlined the day of your exam.  Please call us if you are having persistent problems or have questions about your condition that have not been fully answered at this time.  Sincerely,  Louis Meckel MD  This letter has been electronically signed by your physician.  Appended Document: Patient Notice- Polyp Results letter mailed

## 2010-09-23 NOTE — Letter (Signed)
Summary: Simone Curia MD  Simone Curia MD   Imported By: Lester Townsend 09/18/2010 10:26:24  _____________________________________________________________________  External Attachment:    Type:   Image     Comment:   External Document

## 2010-09-23 NOTE — Letter (Signed)
Summary: Sabino Gasser MD  Sabino Gasser MD   Imported By: Lester Stella 09/18/2010 10:28:32  _____________________________________________________________________  External Attachment:    Type:   Image     Comment:   External Document

## 2010-09-23 NOTE — Procedures (Signed)
Summary: Normand Sloop MD  Normand Sloop MD   Imported By: Lester Lockney 09/18/2010 10:21:15  _____________________________________________________________________  External Attachment:    Type:   Image     Comment:   External Document

## 2010-09-23 NOTE — Letter (Signed)
Summary: Sabino Gasser MD  Sabino Gasser MD   Imported By: Lester Calmar 09/18/2010 10:27:32  _____________________________________________________________________  External Attachment:    Type:   Image     Comment:   External Document

## 2010-09-23 NOTE — Procedures (Signed)
Summary: Flex sig/George Virginia Rochester MD  Flex sig/George Virginia Rochester MD   Imported By: Lester Pharr 09/18/2010 10:25:08  _____________________________________________________________________  External Attachment:    Type:   Image     Comment:   External Document

## 2010-09-23 NOTE — Procedures (Signed)
Summary: Normand Sloop MD  Normand Sloop MD   Imported By: Lester Ellendale 09/18/2010 10:17:19  _____________________________________________________________________  External Attachment:    Type:   Image     Comment:   External Document

## 2010-09-23 NOTE — Procedures (Signed)
Summary: Upper Verlon Setting MD  Upper Verlon Setting MD   Imported By: Lester Bronson 09/18/2010 10:18:49  _____________________________________________________________________  External Attachment:    Type:   Image     Comment:   External Document

## 2010-09-30 ENCOUNTER — Encounter: Payer: Self-pay | Admitting: Family Medicine

## 2010-10-21 LAB — POCT HEMOGLOBIN-HEMACUE: Hemoglobin: 16.8 g/dL (ref 13.0–17.0)

## 2010-11-18 NOTE — Op Note (Signed)
NAME:  Perry Nielsen, MANDATO NO.:  [id number]   MEDICAL RECORD NO.:  [id number]          PATIENT TYPE:  AMB   LOCATION:  [facility]                         FACILITY:  [facility]   PHYSICIAN:  Mark C. Vernie Ammons, M.D.  DATE OF BIRTH:  [birth year]   DATE OF PROCEDURE:  08/17/2008  DATE OF DISCHARGE:                               OPERATIVE REPORT   PREOPERATIVE DIAGNOSES:  Benign prostatic hypertrophy with bladder  outlet obstruction.   POSTOPERATIVE DIAGNOSES:  Benign prostatic hypertrophy with bladder  outlet obstruction.   PROCEDURE:  Transurethral vaporization of prostate.   SURGEON:  Mark C. Vernie Ammons, M.D.   ASSISTANT:  Dr. Allena Katz.   ANESTHESIA:  General.   SPECIMENS:  None.   BLOOD LOSS:  Minimal.   DRAINS:  24-French 3-way Foley catheter with 30-mL balloon.   COMPLICATIONS:  None.   INDICATIONS:  The patient is a 70 year old white male who has had a long  history of BPH with bladder outlet obstruction.  He has been managed in  the past with maximum medical management and when he was seen last in  the office we discussed the treatment options.  He wanted to get off  medication and elected to proceed with resectional therapy.  I went over  the procedure, its risks and complications as well as limitations, and  the patient understands and has elected to proceed.   DESCRIPTION OF OPERATION:  The patient received 400 mg of intravenous  Cipro, and after informed consent was obtained, he was taken to the  major OR where he was placed on the table, administered general  anesthesia, and then moved to the dorsal lithotomy position.  His  genitalia was sterilely prepped and draped and an official time-out was  then performed.  Urethra was sounded with Sissy Hoff sounds to 30-French  and then the 28-French resectoscope was then passed with the [facility]  obturator initially.  The obturator was removed and the 12-degrees lens  was then inserted with the Gyrus button element, and  it was passed on  down through the urethra which was noted to be normal down to the level  of the sphincter.  The prostate revealed trilobar hypertrophy with a  moderate-sized median lobe.  The bladder was entered and noted to have 1-  2+ trabeculation.  It was fully inspected and noted to be free of any  tumor, stones or inflammatory lesions.  Ureteral orifices were noted to  be of normal configuration and position and well away from the bladder  neck.   Resection with the Gyrus instrument was then undertaken, first resecting  the median lobe down to the level of the bladder neck.  I then directed  my attention to the left lobe and began resecting at approximately the 5  o'clock position, and resected the lobe down to the surgical capsule in  a counterclockwise fashion.  I then performed the same procedure on the  right lobe of the prostate.  There was some anterior tissue that was  resected as well, and then I resected back to the level of the veru all  of the  adenomatous tissue.   At the end of the procedure, the bladder was reinspected and noted to be  fully intact, with no evidence of perforation.  No prostatic tissue was  evident within the bladder, and therefore the bladder was left full and  the resectoscope was removed.  The Foley catheter was inserted,  irrigated, and noted to be just slightly pink, and therefore connected  to closed system drainage as well as continuous bladder irrigation with  normal saline.  The patient was awakened and taken to recovery room in  stable satisfactory condition.  He tolerated the procedure well.  There  were no intraoperative complications.   He will be observed overnight and my plan will be to discharge him in  the morning with a copy of his discharge instructions and a prescription  for Vicodin HP #30, Cipro 250 mg b.i.d. #6, and Pyridium 200 mg q.8 h  #40.  He will return to my office in 2 weeks.      Mark C. Vernie Ammons, M.D.   Electronically Signed     MCO/MEDQ  D:  08/17/2008  T:  08/17/2008  Job:  [id number]
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
## 2010-11-21 NOTE — Op Note (Signed)
NAME:  Perry Nielsen, Perry Nielsen NO.:  [id number]   MEDICAL RECORD NO.:  [id number]                   PATIENT TYPE:  AMB   LOCATION:  DSC                                  FACILITY:  [facility]   PHYSICIAN:  Loreta Ave, M.D.              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  DJD AC joint.   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  Labrum tear.   PROCEDURE:  Right shoulder examination under anesthesia, arthroscopy,  debridement of labrum and rotator cuff.  Acromioplasty with CA ligament  release.  Bursectomy.  Excision distal clavicle.  Mini open repair rotator  cuff tear with Fiberwire suture and Concept Repair System.   SURGEON:  Loreta Ave, M.D.   ASSISTANT:  Arlys John D. Petrarca, P.A.-C.   ANESTHESIA:  General.   ESTIMATED BLOOD LOSS:  Minimal.   SPECIMENS:  None.   CULTURES:  None.   COMPLICATIONS:  None.   DRESSING:  Soft compressive with shoulder immobilizer.   DESCRIPTION OF PROCEDURE:  The patient was brought to the operating room and  placed on the operating table in the supine position.  After adequate  anesthesia had been obtained, the right shoulder was examined.  Full motion  and good stability.  Placed in the beach chair position in a shoulder  positioner.  Prepped and draped in the usual sterile fashion.  Three  standard portals; anterior, posterior, lateral.  Shoulder entered with blunt  obturator, distended, and inspected.  Complex tearing anterior labrum  debrided.  Articular cartilage, capsular and ligamentous structures intact.  Biceps tendon and biceps anchor intact.  Complete avulsion of the entire  supraspinatus tendon, retracted 2 cm.  Tear extending into the  infraspinatus  inferiorly.  Subscapularis intact.  Displaced fragments debrided.  Although  a large tear, this was still mobile and reparable.  Cannula redirected  subacromially.  Type 2 to 3 acromion with chronic impingement.  The cuff  debrided.  Bursa resected.  Acromioplasty to a type 1 acromion with shaver  and high speed bur.  CA ligament release with cautery.  Distal clavicle  exposed and lateral 1 cm and periarticular spurs resected.  Adequacy of  decompression and clavicle excision confirmed viewing from all portals.  Instruments and fluid removed.  Deltoid splitting incision through the  lateral portal.  Subacromial space accessed.  Adequacy of decompression  confirmed digitally.  The bony trough in the humerus created for attachment  of the cuff.  The cuff mobilized.  Captured with three weave #2 Fiberwire  sutures, most posterior of which was used to do an end-to-end repair of the  tendon where it was torn into the infraspinatus  tendon.  Wound irrigated.  Series of drill holes made in the humerus with the Concept Repair System and  sutures weaved through the drill holes.  They were then firmly tied down  over a bony bridge with the arm abducted.  This yielded a nice firm water  tight closure of the cuff and I could bring it to a full passive motion  after repair without undue tension.  Very nice complete closure of the  entire cuff from the biceps tendon in the front into the infraspinatus in  the back.  Wound irrigated.  Deltoid closed with Vicryl.  The skin and  subcutaneous tissue with Vicryl.  Portals closed with nylon.  Margins of the  wound injected with Marcaine.  Subacromial space injected with Marcaine.  Sterile compressive dressing and shoulder immobilizer applied.  Anesthesia  reversed.  Brought to the recovery room.  Tolerated the surgery well with no  complications.                                               Loreta Ave, M.D.    DFM/MEDQ  D:   03/13/2004  T:  03/13/2004  Job:  [id number]

## 2011-03-12 ENCOUNTER — Telehealth: Payer: Self-pay | Admitting: Family Medicine

## 2011-03-12 MED ORDER — SILDENAFIL CITRATE 100 MG PO TABS: 100.0000 mg | ORAL_TABLET | ORAL | Status: DC | PRN

## 2011-03-12 NOTE — Telephone Encounter (Signed)
Pt requesting refill on Viagra. He is requesting to have his dose raised from 50mg  to 100mg . Please contact pt  [name]

## 2011-04-17 ENCOUNTER — Telehealth: Payer: Self-pay | Admitting: Family Medicine

## 2011-04-17 DIAGNOSIS — G4720 Circadian rhythm sleep disorder, unspecified type: Secondary | ICD-10-CM

## 2011-04-17 MED ORDER — TEMAZEPAM 30 MG PO CAPS: 30.0000 mg | ORAL_CAPSULE | Freq: Every evening | ORAL | Status: DC | PRN

## 2011-04-17 NOTE — Telephone Encounter (Signed)
Pt need refill on temazepam 30mg  call into [facility] [address] [phone]

## 2011-04-17 NOTE — Telephone Encounter (Signed)
30

## 2011-04-22 NOTE — Telephone Encounter (Signed)
rx recalled into pharmacy

## 2011-05-19 ENCOUNTER — Telehealth: Payer: Self-pay | Admitting: *Deleted

## 2011-05-19 DIAGNOSIS — G4720 Circadian rhythm sleep disorder, unspecified type: Secondary | ICD-10-CM

## 2011-05-19 MED ORDER — TEMAZEPAM 30 MG PO CAPS: 30.0000 mg | ORAL_CAPSULE | Freq: Every evening | ORAL | Status: DC | PRN

## 2011-05-19 NOTE — Telephone Encounter (Signed)
Rx refill temazepam 30mg  sent to [facility].  (90 day supply).  Pt states last refill was approved while Dr. Tawanna Cooler was out of the office therefore he was only given a 30 day, needs a 90 day supply.

## 2011-05-19 NOTE — Telephone Encounter (Signed)
ok 

## 2011-05-19 NOTE — Telephone Encounter (Signed)
Called in to [facility]. KIK

## 2011-07-21 ENCOUNTER — Other Ambulatory Visit: Payer: Self-pay | Admitting: Dermatology

## 2011-08-11 ENCOUNTER — Encounter: Payer: Self-pay | Admitting: Family Medicine

## 2011-08-11 ENCOUNTER — Ambulatory Visit (INDEPENDENT_AMBULATORY_CARE_PROVIDER_SITE_OTHER): Payer: Medicare Other | Admitting: Family Medicine

## 2011-08-11 VITALS — BP 120/80 | HR 80 | Temp 97.0°F | Resp 12 | Ht 71.5 in | Wt 182.0 lb

## 2011-08-11 DIAGNOSIS — Z0000 Encounter for general adult medical examination without abnormal findings: Secondary | ICD-10-CM

## 2011-08-11 DIAGNOSIS — I10 Essential (primary) hypertension: Secondary | ICD-10-CM

## 2011-08-11 DIAGNOSIS — F528 Other sexual dysfunction not due to a substance or known physiological condition: Secondary | ICD-10-CM

## 2011-08-11 DIAGNOSIS — N400 Enlarged prostate without lower urinary tract symptoms: Secondary | ICD-10-CM

## 2011-08-11 DIAGNOSIS — G4720 Circadian rhythm sleep disorder, unspecified type: Secondary | ICD-10-CM

## 2011-08-11 DIAGNOSIS — R0989 Other specified symptoms and signs involving the circulatory and respiratory systems: Secondary | ICD-10-CM

## 2011-08-11 DIAGNOSIS — J309 Allergic rhinitis, unspecified: Secondary | ICD-10-CM

## 2011-08-11 DIAGNOSIS — R5383 Other fatigue: Secondary | ICD-10-CM

## 2011-08-11 LAB — CBC WITH DIFFERENTIAL/PLATELET
Basophils Absolute: 0 10*3/uL (ref 0.0–0.1)
Eosinophils Absolute: 0.2 10*3/uL (ref 0.0–0.7)
Hemoglobin: 15.4 g/dL (ref 13.0–17.0)
Lymphocytes Relative: 22.4 % (ref 12.0–46.0)
MCHC: 33.5 g/dL (ref 30.0–36.0)
Monocytes Relative: 9.1 % (ref 3.0–12.0)
Neutrophils Relative %: 63.2 % (ref 43.0–77.0)
Platelets: 109 10*3/uL — ABNORMAL LOW (ref 150.0–400.0)
RDW: 13.2 % (ref 11.5–14.6)

## 2011-08-11 LAB — LIPID PANEL
Cholesterol: 187 mg/dL (ref 0–200)
HDL: 39.5 mg/dL (ref >39.00)
LDL Cholesterol: 136 mg/dL — ABNORMAL HIGH (ref 0–99)
Total CHOL/HDL Ratio: 5
Triglycerides: 60 mg/dL (ref 0.0–149.0)

## 2011-08-11 LAB — POCT URINALYSIS DIPSTICK
Bilirubin, UA: NEGATIVE
Blood, UA: NEGATIVE
Glucose, UA: NEGATIVE
Leukocytes, UA: NEGATIVE
Nitrite, UA: NEGATIVE
Urobilinogen, UA: 0.2

## 2011-08-11 LAB — BASIC METABOLIC PANEL
Calcium: 9 mg/dL (ref 8.4–10.5)
GFR: 80.36 mL/min (ref >60.00)
Glucose, Bld: 83 mg/dL (ref 70–99)
Potassium: 5.2 mEq/L — ABNORMAL HIGH (ref 3.5–5.1)
Sodium: 142 mEq/L (ref 135–145)

## 2011-08-11 LAB — HEPATIC FUNCTION PANEL
AST: 22 U/L (ref 0–37)
Albumin: 3.7 g/dL (ref 3.5–5.2)
Alkaline Phosphatase: 66 U/L (ref 39–117)
Total Protein: 6.3 g/dL (ref 6.0–8.3)

## 2011-08-11 LAB — PSA: PSA: 2.3 ng/mL (ref 0.10–4.00)

## 2011-08-11 LAB — TSH: TSH: 5.42 u[IU]/mL (ref 0.35–5.50)

## 2011-08-11 MED ORDER — TEMAZEPAM 30 MG PO CAPS: 30.0000 mg | ORAL_CAPSULE | Freq: Every evening | ORAL | Status: DC | PRN

## 2011-08-11 MED ORDER — SILDENAFIL CITRATE 100 MG PO TABS: 100.0000 mg | ORAL_TABLET | ORAL | Status: DC | PRN

## 2011-08-11 NOTE — Patient Instructions (Signed)
Continue your good health habits  I will call you about y  lab work back  Followup in 1 year sooner if any problems is

## 2011-08-11 NOTE — Progress Notes (Signed)
  Subjective:    Patient ID: Perry Nielsen, male    DOB: [birth year], 71 y.o.   MRN: [id number]  HPI  Perry Nielsen is a 71 year old married male nonsmoker who comes in today for a Medicare wellness examination  He has a history of chronic insomnia for which she takes Restoril 30 mg each bedtime  He has a history of mild erectile dysfunction and BPH. He takes Viagra 100 mg when necessary  He takes a baby aspirin daily but he bruises a lot. 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 He exercises on a daily basis. Home health safety reviewed no issues identified, no guns in the house, he does have a health care power of attorney and living will    Review of Systems  Constitutional: Negative.   HENT: Negative.   Eyes: Negative.   Respiratory: Negative.   Cardiovascular: Negative.   Gastrointestinal: Negative.   Genitourinary: Negative.   Musculoskeletal: Negative.   Skin: Negative.   Neurological: Negative.   Hematological: Negative.   Psychiatric/Behavioral: Negative.        Objective:   Physical Exam  Constitutional: He is oriented to person, place, and time. He appears well-developed and well-nourished.  HENT:  Head: Normocephalic and atraumatic.  Right Ear: External ear normal.  Left Ear: External ear normal.  Nose: Nose normal.  Mouth/Throat: Oropharynx is clear and moist.  Eyes: Conjunctivae and EOM are normal. Pupils are equal, round, and reactive to light.  Neck: Normal range of motion. Neck supple. No JVD present. No tracheal deviation present. No thyromegaly present.  Cardiovascular: Normal rate, regular rhythm, normal heart sounds and intact distal pulses.  Exam reveals no gallop and no friction rub.   No murmur  heard. Pulmonary/Chest: Effort normal and breath sounds normal. No stridor. No respiratory distress. He has no wheezes. He has no rales. He exhibits no tenderness.  Abdominal: Soft. Bowel sounds are normal. He exhibits no distension and no mass. There is no tenderness. There is no rebound and no guarding.  Genitourinary: Rectum normal and penis normal. Guaiac negative stool. No penile tenderness.       2+ symmetrical BPH,,,,,,,, nocturia x3  Musculoskeletal: Normal range of motion. He exhibits no edema and no tenderness.  Lymphadenopathy:    He has no cervical adenopathy.  Neurological: He is alert and oriented to person, place, and time. He has normal reflexes. No cranial nerve deficit. He exhibits normal muscle tone.  Skin: Skin is warm and dry. No rash noted. No erythema. No pallor.       Total body skin exam normal  Psychiatric: He has a normal mood and affect. His behavior is normal. Judgment and thought content normal.          Assessment & Plan:  Healthy male  Chronic sleep dysfunction continue Restoril 30 mg each bedtime  Erectile dysfunction continue Viagra when necessary  Recommend aspirin Monday Wednesday Friday  Return 1 year sooner if any problems

## 2011-08-13 ENCOUNTER — Telehealth: Payer: Self-pay | Admitting: Family Medicine

## 2011-08-13 DIAGNOSIS — G4720 Circadian rhythm sleep disorder, unspecified type: Secondary | ICD-10-CM

## 2011-08-13 MED ORDER — TEMAZEPAM 30 MG PO CAPS: 30.0000 mg | ORAL_CAPSULE | Freq: Every evening | ORAL | Status: DC | PRN

## 2011-08-13 NOTE — Telephone Encounter (Signed)
Patient states that his sleep rx was not at the pharmacy. Please assist and call patient with advise.

## 2011-09-16 ENCOUNTER — Other Ambulatory Visit: Payer: Self-pay | Admitting: Dermatology

## 2012-03-08 ENCOUNTER — Encounter: Payer: Self-pay | Admitting: Internal Medicine

## 2012-03-08 ENCOUNTER — Ambulatory Visit (INDEPENDENT_AMBULATORY_CARE_PROVIDER_SITE_OTHER): Payer: Medicare Other | Admitting: Internal Medicine

## 2012-03-08 VITALS — BP 110/70 | Temp 97.7°F | Wt 180.0 lb

## 2012-03-08 DIAGNOSIS — B353 Tinea pedis: Secondary | ICD-10-CM

## 2012-03-08 MED ORDER — TRIAMCINOLONE ACETONIDE 0.1 % EX CREA
TOPICAL_CREAM | Freq: Two times a day (BID) | CUTANEOUS | Status: DC
Start: 2012-03-08 — End: 2012-10-13

## 2012-03-08 NOTE — Patient Instructions (Signed)
Apply an antifungal cream such as Lamisil or Lotrimin twice daily  Keep the area clean and dry  Call or return to clinic prn if these symptoms worsen or fail to improve as anticipated.

## 2012-03-08 NOTE — Progress Notes (Signed)
  Subjective:    Patient ID: Perry Nielsen, male    DOB: Nov 13, 1940, 71 y.o.   MRN: [id number]  HPI  71 year old patient who presents with a 3 to four-day history of a rash involving the dorsal aspect of his right foot. Rash is nonpruritic. He was concerned about a possible early serious skin and soft tissue infection. He does have some mild toenail onychomycosis for which he is applying some topical OTC medication. He has tried OTC hydrocortisone without much benefit    Review of Systems  Skin: Positive for rash.       Objective:   Physical Exam  Skin:       A few scattered erythematous macules noted the involving the dorsal aspect of the right foot. Mild toenail onychomycosis noted. Patient has some dry flaky skin between the first and second and third and fourth toes          Assessment & Plan:   Nonspecific dermatitis. Possible fungal. Rash is minor and asymptomatic. We'll first try an OTC antifungal. If this is ineffective was given a prescription for triamcinolone. He does appear to have mild tinea pedis

## 2012-03-24 ENCOUNTER — Ambulatory Visit (INDEPENDENT_AMBULATORY_CARE_PROVIDER_SITE_OTHER): Payer: Medicare Other | Admitting: Family Medicine

## 2012-03-24 ENCOUNTER — Encounter: Payer: Self-pay | Admitting: Family Medicine

## 2012-03-24 VITALS — BP 120/80 | Temp 97.6°F | Wt 180.0 lb

## 2012-03-24 DIAGNOSIS — M722 Plantar fascial fibromatosis: Secondary | ICD-10-CM

## 2012-03-24 MED ORDER — TRAMADOL HCL 50 MG PO TABS: 50.0000 mg | ORAL_TABLET | Freq: Three times a day (TID) | ORAL | Status: DC | PRN

## 2012-03-24 NOTE — Patient Instructions (Signed)
Motrin 600 mg twice daily with food  Elevation and ice as much as possible  Do your stretching exercises in the morning before you get out of bed  If the pain persists or gets worse we will get you set up for a physical therapy consult with Jeanene Erb

## 2012-03-24 NOTE — Progress Notes (Signed)
  Subjective:    Patient ID: Perry Nielsen, male    DOB: 03/23/1941, 71 y.o.   MRN: [id number]  HPI Perry Nielsen is a 77-year-old male who comes in today for evaluation of pain in his right heel for one week  No history of trauma. He states about 25 years ago he had plantar fasciitis however he does not recall which foot it was.   Review of Systems Gen. an orthopedic review of systems otherwise negative    Objective:   Physical Exam Well-developed well-nourished male in no acute distress examination of foot it appears normal pulses normal skin normal there is tenderness with palpation of the plantar fascia consistent with plantar fasciitis       Assessment & Plan:  Plantar fasciitis plan elevation ice Motrin 600 twice a day PT consult if symptoms persist

## 2012-05-02 ENCOUNTER — Telehealth: Payer: Self-pay | Admitting: Family Medicine

## 2012-05-02 DIAGNOSIS — G4720 Circadian rhythm sleep disorder, unspecified type: Secondary | ICD-10-CM

## 2012-05-02 MED ORDER — TEMAZEPAM 30 MG PO CAPS: 30.0000 mg | ORAL_CAPSULE | Freq: Every evening | ORAL | Status: DC | PRN

## 2012-05-02 NOTE — Telephone Encounter (Signed)
Rx called in 

## 2012-05-02 NOTE — Telephone Encounter (Signed)
Pt has cpx for 08/11/12. He will run of his temazepam (RESTORIL) 30 MG capsule This Thursday and he is going out of town. Pt requesting we send in refill to [facility] on [address] by Thursday, as he is leaving town. Thank you.

## 2012-05-31 ENCOUNTER — Ambulatory Visit (INDEPENDENT_AMBULATORY_CARE_PROVIDER_SITE_OTHER): Payer: Medicare Other | Admitting: Family Medicine

## 2012-05-31 ENCOUNTER — Encounter: Payer: Self-pay | Admitting: Family Medicine

## 2012-05-31 VITALS — BP 112/70 | Temp 98.1°F | Wt 183.0 lb

## 2012-05-31 DIAGNOSIS — N400 Enlarged prostate without lower urinary tract symptoms: Secondary | ICD-10-CM

## 2012-05-31 DIAGNOSIS — R358 Other polyuria: Secondary | ICD-10-CM

## 2012-05-31 LAB — POCT URINALYSIS DIPSTICK
Ketones, UA: NEGATIVE
Leukocytes, UA: NEGATIVE
Nitrite, UA: NEGATIVE
Protein, UA: NEGATIVE
pH, UA: 7.5

## 2012-05-31 MED ORDER — DOXAZOSIN MESYLATE 4 MG PO TABS: 4.0000 mg | ORAL_TABLET | Freq: Every day | ORAL | Status: DC

## 2012-05-31 NOTE — Patient Instructions (Signed)
Cardura 4 mg,,,,,,,,,,, one tablet daily at bedtime  Avoid all caffeine products  Return in 3 months for followup, sooner if any problems

## 2012-05-31 NOTE — Progress Notes (Signed)
  Subjective:    Patient ID: Perry Nielsen, male    DOB: [birth year], 71 y.o.   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 Tonie Griffith. He's done well since then but recently began having increased urination dribbling change in flow nocturia etc. No infection. Nocturia about x2 he has cut his caffeine consumption down however that does not seem to help very much. In the past he was on Proscar and Flomax which didn't seem to do much good.   Review of Systems Gen.   general and neurologic review of systems otherwise negative Objective:   Physical Exam Well-developed well-nourished male in no acute distress urinalysis normal       Assessment & Plan:

## 2012-07-12 ENCOUNTER — Other Ambulatory Visit: Payer: Self-pay | Admitting: Dermatology

## 2012-08-01 ENCOUNTER — Other Ambulatory Visit: Payer: Self-pay | Admitting: *Deleted

## 2012-08-01 DIAGNOSIS — G4720 Circadian rhythm sleep disorder, unspecified type: Secondary | ICD-10-CM

## 2012-08-01 MED ORDER — TEMAZEPAM 30 MG PO CAPS: 30.0000 mg | ORAL_CAPSULE | Freq: Every evening | ORAL | Status: DC | PRN

## 2012-08-11 ENCOUNTER — Encounter: Payer: Medicare Other | Admitting: Family Medicine

## 2012-08-12 ENCOUNTER — Encounter: Payer: Medicare Other | Admitting: Family Medicine

## 2012-09-05 ENCOUNTER — Encounter: Payer: Medicare Other | Admitting: Family Medicine

## 2012-10-13 ENCOUNTER — Ambulatory Visit (INDEPENDENT_AMBULATORY_CARE_PROVIDER_SITE_OTHER): Payer: Medicare Other | Admitting: Family Medicine

## 2012-10-13 ENCOUNTER — Encounter: Payer: Self-pay | Admitting: Family Medicine

## 2012-10-13 VITALS — BP 120/84 | Temp 97.7°F | Ht 71.5 in | Wt 179.5 lb

## 2012-10-13 DIAGNOSIS — G4720 Circadian rhythm sleep disorder, unspecified type: Secondary | ICD-10-CM

## 2012-10-13 DIAGNOSIS — R0989 Other specified symptoms and signs involving the circulatory and respiratory systems: Secondary | ICD-10-CM

## 2012-10-13 DIAGNOSIS — R972 Elevated prostate specific antigen [PSA]: Secondary | ICD-10-CM

## 2012-10-13 DIAGNOSIS — F528 Other sexual dysfunction not due to a substance or known physiological condition: Secondary | ICD-10-CM

## 2012-10-13 DIAGNOSIS — M549 Dorsalgia, unspecified: Secondary | ICD-10-CM

## 2012-10-13 DIAGNOSIS — K219 Gastro-esophageal reflux disease without esophagitis: Secondary | ICD-10-CM

## 2012-10-13 DIAGNOSIS — N400 Enlarged prostate without lower urinary tract symptoms: Secondary | ICD-10-CM

## 2012-10-13 DIAGNOSIS — Z0000 Encounter for general adult medical examination without abnormal findings: Secondary | ICD-10-CM

## 2012-10-13 LAB — CBC WITH DIFFERENTIAL/PLATELET
Basophils Relative: 0.7 % (ref 0.0–3.0)
Eosinophils Relative: 3.4 % (ref 0.0–5.0)
HCT: 48.5 % (ref 39.0–52.0)
Hemoglobin: 16.1 g/dL (ref 13.0–17.0)
Lymphocytes Relative: 24 % (ref 12.0–46.0)
Lymphs Abs: 1.7 10*3/uL (ref 0.7–4.0)
Monocytes Relative: 7.9 % (ref 3.0–12.0)
Neutro Abs: 4.5 10*3/uL (ref 1.4–7.7)
RBC: 5.23 Mil/uL (ref 4.22–5.81)
RDW: 13.4 % (ref 11.5–14.6)
WBC: 7 10*3/uL (ref 4.5–10.5)

## 2012-10-13 LAB — POCT URINALYSIS DIPSTICK
Bilirubin, UA: NEGATIVE
Blood, UA: NEGATIVE
Glucose, UA: NEGATIVE
Ketones, UA: NEGATIVE
Nitrite, UA: NEGATIVE
Spec Grav, UA: 1.02
pH, UA: 6

## 2012-10-13 LAB — HEPATIC FUNCTION PANEL
AST: 24 U/L (ref 0–37)
Albumin: 3.8 g/dL (ref 3.5–5.2)
Total Bilirubin: 0.9 mg/dL (ref 0.3–1.2)

## 2012-10-13 LAB — BASIC METABOLIC PANEL
Calcium: 9.1 mg/dL (ref 8.4–10.5)
Creatinine, Ser: 1 mg/dL (ref 0.4–1.5)
GFR: 79.16 mL/min (ref >60.00)
Sodium: 141 mEq/L (ref 135–145)

## 2012-10-13 LAB — PSA: PSA: 4.98 ng/mL — ABNORMAL HIGH (ref 0.10–4.00)

## 2012-10-13 MED ORDER — TRIAMCINOLONE ACETONIDE 0.1 % EX CREA: TOPICAL_CREAM | Freq: Two times a day (BID) | CUTANEOUS | Status: AC

## 2012-10-13 MED ORDER — SILDENAFIL CITRATE 100 MG PO TABS: 50.0000 mg | ORAL_TABLET | Freq: Every day | ORAL | Status: DC | PRN

## 2012-10-13 MED ORDER — ZOLPIDEM TARTRATE 5 MG PO TABS: ORAL_TABLET | ORAL | Status: DC

## 2012-10-13 MED ORDER — DOXAZOSIN MESYLATE 4 MG PO TABS: 4.0000 mg | ORAL_TABLET | Freq: Every day | ORAL | Status: DC

## 2012-10-13 NOTE — Patient Instructions (Signed)
Stopped the temazepam  Ambien 5 mg,,,,,, one half each bedtime when necessary  Continue your good health habits  Return in one year for general Medicare wellness examination sooner if any problems

## 2012-10-13 NOTE — Progress Notes (Signed)
  Subjective:    Patient ID: Perry Nielsen, male    DOB: 11-08-1940, 72 y.o.   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 Vaccinations up-to-date  Cognitive function normal he exercises on a regular basis he swims a mile 3 times a week and plays golf 3 times a week. Home health safety reviewed no issues identified, no guns in the house, he does have a health care power of attorney and living well     Review of Systems  Constitutional: Negative.   HENT: Negative.   Eyes: Negative.   Respiratory: Negative.   Cardiovascular: Negative.   Gastrointestinal: Negative.   Genitourinary: Negative.   Musculoskeletal: Negative.   Skin: Negative.   Neurological: Negative.   Psychiatric/Behavioral: Negative.        Objective:   Physical Exam  Constitutional: He is oriented to person, place, and time. He appears well-developed and well-nourished.  HENT:  Head: Normocephalic and atraumatic.  Right Ear: External ear normal.  Left Ear: External ear normal.  Nose: Nose normal.  Mouth/Throat: Oropharynx is clear and moist.  Eyes: Conjunctivae and EOM are normal. Pupils are equal, round, and reactive to light.  Neck: Normal range of motion. Neck supple. No JVD present. No tracheal deviation present. No thyromegaly present.  Cardiovascular: Normal rate, regular rhythm, normal heart sounds and intact distal pulses.  Exam reveals no gallop and no friction rub.   No murmur heard. No carotid or abdominal aortic bruits  audible  Pulmonary/Chest: Effort normal and breath sounds normal. No stridor. No respiratory distress. He has no wheezes. He has no rales. He exhibits no tenderness.  Abdominal: Soft. Bowel sounds are normal. He exhibits no distension and no mass. There is no tenderness. There is no rebound and no guarding.  Genitourinary: Rectum normal and penis normal. Guaiac negative stool. No penile tenderness.  2+ symmetrical BPH nonnodular  Musculoskeletal: Normal range of motion. He exhibits no edema and no tenderness.  Lymphadenopathy:    He has no cervical adenopathy.  Neurological: He is alert and oriented to person, place, and time. He has normal reflexes. No cranial nerve deficit. He exhibits normal muscle tone.  Skin: Skin is warm and dry. No rash noted. No erythema. No pallor.  Total body skin exam normal except for scar right shoulder from previous shoulder surgery  Psychiatric: He has a normal mood and affect. His behavior is normal. Judgment and thought content normal.          Assessment & Plan:  Healthy male  BPH continue Cardura 4 mg daily  Erectile dysfunction continue Viagra when necessary  Sleep dysfunction ,,,,,,,, trial of Ambien 5 mg each bedtime when necessary  Recent bilateral cataract removal and lens implant  Dermatitis continue triamcinolone when necessary  History of left carotid bruit continue aspirin prophylaxis  History of colon polyp followup in GI as outlined

## 2012-10-14 NOTE — Addendum Note (Signed)
Addended by: Kern Reap B on: 10/14/2012 02:35 PM   Modules accepted: Orders

## 2012-10-20 ENCOUNTER — Encounter: Payer: Medicare Other | Admitting: Family Medicine

## 2012-11-01 ENCOUNTER — Telehealth: Payer: Self-pay | Admitting: Family Medicine

## 2012-11-01 DIAGNOSIS — Z0000 Encounter for general adult medical examination without abnormal findings: Secondary | ICD-10-CM

## 2012-11-01 MED ORDER — ZOLPIDEM TARTRATE 5 MG PO TABS: 5.0000 mg | ORAL_TABLET | Freq: Every evening | ORAL | Status: DC | PRN

## 2012-11-01 NOTE — Telephone Encounter (Signed)
Okay to fill one tab qhs but only 5 mg per Dr Tawanna Cooler

## 2012-11-01 NOTE — Telephone Encounter (Signed)
PT stated that when Dr. Tawanna Cooler prescribed him his zolpidem (AMBIEN) 5 MG tablet he told him to cut the tablet in half each night. The pt stated that this is not enough and he needs to take a whole tablet. Therefor he will run out of his medication early so he would like to discuss this with you and get a refill of this medication. Please assist.

## 2012-11-01 NOTE — Telephone Encounter (Signed)
Spoke with patient and rx called in

## 2012-12-19 ENCOUNTER — Telehealth: Payer: Self-pay | Admitting: Family Medicine

## 2012-12-19 NOTE — Telephone Encounter (Signed)
Pt would like for you to give him a call today. Pt said it is not emergency, but would appreciate call today.

## 2012-12-19 NOTE — Telephone Encounter (Signed)
Patient is getting dizzy when bending down.  He will try half tab of medication and call back as needed.

## 2013-03-06 ENCOUNTER — Other Ambulatory Visit: Payer: Self-pay | Admitting: Family Medicine

## 2013-03-08 ENCOUNTER — Other Ambulatory Visit: Payer: Self-pay | Admitting: Dermatology

## 2013-03-08 DIAGNOSIS — C4491 Basal cell carcinoma of skin, unspecified: Secondary | ICD-10-CM

## 2013-03-08 HISTORY — DX: Basal cell carcinoma of skin, unspecified: C44.91

## 2013-03-10 ENCOUNTER — Telehealth: Payer: Self-pay | Admitting: Family Medicine

## 2013-03-10 MED ORDER — ZOLPIDEM TARTRATE 5 MG PO TABS: ORAL_TABLET | ORAL | Status: DC

## 2013-03-10 NOTE — Telephone Encounter (Signed)
Pt states the rx zolpidem (AMBIEN) 5 MG tablet is not at pharm. . Can you resend to [facility] going out of town and only has one left.

## 2013-04-05 ENCOUNTER — Other Ambulatory Visit (INDEPENDENT_AMBULATORY_CARE_PROVIDER_SITE_OTHER): Payer: Medicare Other

## 2013-04-05 DIAGNOSIS — R972 Elevated prostate specific antigen [PSA]: Secondary | ICD-10-CM

## 2013-04-06 LAB — PSA: PSA: 2.55 ng/mL (ref 0.10–4.00)

## 2013-04-12 ENCOUNTER — Telehealth: Payer: Self-pay | Admitting: Family Medicine

## 2013-04-12 NOTE — Telephone Encounter (Signed)
Would like a call about his labs from 04/05/2013 Wednesday's appointment. The levels.

## 2013-04-13 NOTE — Telephone Encounter (Signed)
Left message on machine for patient returning his call. 

## 2013-04-14 NOTE — Telephone Encounter (Signed)
Reviewed lab results with wife

## 2013-08-16 ENCOUNTER — Telehealth: Payer: Self-pay | Admitting: Family Medicine

## 2013-08-16 NOTE — Telephone Encounter (Signed)
Pt states he is not sleeping and would like his rx zolpidem (AMBIEN) 5 MG tablet increased. pls advise [facility]/ [address]

## 2013-08-17 MED ORDER — TRAZODONE HCL 50 MG PO TABS: ORAL_TABLET | ORAL | Status: DC

## 2013-08-17 NOTE — Telephone Encounter (Signed)
Rx called in.  Spoke with wife.

## 2013-08-17 NOTE — Telephone Encounter (Signed)
Per Dr Sherren Mocha - no increase to Ambien.  Patient can try Trazodone 50 mg half tab for two weeks.  If it works leave the dosage the same.  If not he can increase to one tab at bedtime.  #100 with 3 refills. Left message on machine for patient to return our call

## 2013-08-24 ENCOUNTER — Telehealth: Payer: Self-pay | Admitting: Family Medicine

## 2013-08-24 NOTE — Telephone Encounter (Signed)
Patient has already tried one and half tab of Trazodone 50 mg  and not sleeping until 3 am.  He would like to know if he can try 2 tabs . Trazodone 100 mg.?

## 2013-08-24 NOTE — Telephone Encounter (Signed)
Wife has additional questions if you could pls call

## 2013-08-24 NOTE — Telephone Encounter (Signed)
Patient should try trazodone 75 mg.  Wife is aware.

## 2013-08-24 NOTE — Telephone Encounter (Signed)
Pt states traZODone (DESYREL) 50 MG tablet didn't help for his sleep.  He would like to know if you can suggest something else?

## 2013-08-25 NOTE — Telephone Encounter (Signed)
Patient is aware 

## 2013-08-25 NOTE — Telephone Encounter (Signed)
Yes

## 2013-08-27 ENCOUNTER — Other Ambulatory Visit: Payer: Self-pay | Admitting: Family Medicine

## 2013-10-09 ENCOUNTER — Telehealth: Payer: Self-pay | Admitting: Family Medicine

## 2013-10-09 MED ORDER — TRAZODONE HCL 50 MG PO TABS: 50.0000 mg | ORAL_TABLET | Freq: Two times a day (BID) | ORAL | Status: DC

## 2013-10-09 NOTE — Telephone Encounter (Signed)
Pt is needing new rx traZODone (DESYREL) 50 MG tablet sent to [facility] [address].

## 2013-10-09 NOTE — Telephone Encounter (Signed)
Rx sent 

## 2013-10-17 ENCOUNTER — Ambulatory Visit (INDEPENDENT_AMBULATORY_CARE_PROVIDER_SITE_OTHER): Payer: Medicare Other | Admitting: Family Medicine

## 2013-10-17 ENCOUNTER — Encounter: Payer: Self-pay | Admitting: Family Medicine

## 2013-10-17 VITALS — BP 120/84 | Temp 97.9°F | Ht 72.0 in | Wt 176.0 lb

## 2013-10-17 DIAGNOSIS — R0989 Other specified symptoms and signs involving the circulatory and respiratory systems: Secondary | ICD-10-CM

## 2013-10-17 DIAGNOSIS — Z23 Encounter for immunization: Secondary | ICD-10-CM | POA: Diagnosis not present

## 2013-10-17 DIAGNOSIS — Z0000 Encounter for general adult medical examination without abnormal findings: Secondary | ICD-10-CM | POA: Diagnosis not present

## 2013-10-17 DIAGNOSIS — G4720 Circadian rhythm sleep disorder, unspecified type: Secondary | ICD-10-CM

## 2013-10-17 DIAGNOSIS — N400 Enlarged prostate without lower urinary tract symptoms: Secondary | ICD-10-CM

## 2013-10-17 DIAGNOSIS — F528 Other sexual dysfunction not due to a substance or known physiological condition: Secondary | ICD-10-CM

## 2013-10-17 LAB — CBC WITH DIFFERENTIAL/PLATELET
BASOS PCT: 0.6 % (ref 0.0–3.0)
Basophils Absolute: 0 10*3/uL (ref 0.0–0.1)
EOS ABS: 0.2 10*3/uL (ref 0.0–0.7)
Eosinophils Relative: 3.7 % (ref 0.0–5.0)
HCT: 45.2 % (ref 39.0–52.0)
HEMOGLOBIN: 15.3 g/dL (ref 13.0–17.0)
Lymphocytes Relative: 28.3 % (ref 12.0–46.0)
Lymphs Abs: 1.5 10*3/uL (ref 0.7–4.0)
MCHC: 33.8 g/dL (ref 30.0–36.0)
MCV: 92.7 fl (ref 78.0–100.0)
MONO ABS: 0.6 10*3/uL (ref 0.1–1.0)
Monocytes Relative: 10.9 % (ref 3.0–12.0)
NEUTROS ABS: 3 10*3/uL (ref 1.4–7.7)
Neutrophils Relative %: 56.5 % (ref 43.0–77.0)
Platelets: 132 10*3/uL — ABNORMAL LOW (ref 150.0–400.0)
RBC: 4.87 Mil/uL (ref 4.22–5.81)
RDW: 13.4 % (ref 11.5–14.6)
WBC: 5.2 10*3/uL (ref 4.5–10.5)

## 2013-10-17 LAB — POCT URINALYSIS DIPSTICK
Bilirubin, UA: NEGATIVE
Glucose, UA: NEGATIVE
Ketones, UA: NEGATIVE
Leukocytes, UA: NEGATIVE
Nitrite, UA: NEGATIVE
RBC UA: NEGATIVE
SPEC GRAV UA: 1.025
UROBILINOGEN UA: 0.2
pH, UA: 6

## 2013-10-17 LAB — BASIC METABOLIC PANEL
BUN: 17 mg/dL (ref 6–23)
CALCIUM: 8.9 mg/dL (ref 8.4–10.5)
CO2: 28 mEq/L (ref 19–32)
Chloride: 107 mEq/L (ref 96–112)
Creatinine, Ser: 1.1 mg/dL (ref 0.4–1.5)
GFR: 73.75 mL/min (ref >60.00)
Glucose, Bld: 83 mg/dL (ref 70–99)
POTASSIUM: 4 meq/L (ref 3.5–5.1)
SODIUM: 140 meq/L (ref 135–145)

## 2013-10-17 LAB — HEPATIC FUNCTION PANEL
ALK PHOS: 56 U/L (ref 39–117)
ALT: 19 U/L (ref 0–53)
AST: 21 U/L (ref 0–37)
Albumin: 3.8 g/dL (ref 3.5–5.2)
Bilirubin, Direct: 0.1 mg/dL (ref 0.0–0.3)
Total Bilirubin: 0.9 mg/dL (ref 0.3–1.2)
Total Protein: 6.3 g/dL (ref 6.0–8.3)

## 2013-10-17 LAB — TSH: TSH: 4.79 u[IU]/mL (ref 0.35–5.50)

## 2013-10-17 LAB — PSA: PSA: 3.03 ng/mL (ref 0.10–4.00)

## 2013-10-17 LAB — LIPID PANEL
CHOLESTEROL: 164 mg/dL (ref 0–200)
HDL: 43.2 mg/dL (ref >39.00)
LDL CALC: 104 mg/dL — AB (ref 0–99)
Total CHOL/HDL Ratio: 4
Triglycerides: 82 mg/dL (ref 0.0–149.0)
VLDL: 16.4 mg/dL (ref 0.0–40.0)

## 2013-10-17 MED ORDER — SILDENAFIL CITRATE 100 MG PO TABS: 100.0000 mg | ORAL_TABLET | ORAL | Status: DC | PRN

## 2013-10-17 MED ORDER — TRAZODONE HCL 100 MG PO TABS: 100.0000 mg | ORAL_TABLET | Freq: Every day | ORAL | Status: DC

## 2013-10-17 NOTE — Patient Instructions (Signed)
Continue good health habits  We will schedule a followup ultrasound of your carotid arteries  I will call you the report  Return in one year for general physical exam sooner if any problems  Dr. Rodena Goldmann

## 2013-10-17 NOTE — Progress Notes (Signed)
   Subjective:    Patient ID: Perry Nielsen, male    DOB: 04-May-1941, 73 y.o.   MRN: [id number]  HPI [name] is a 51 year old married male nonsmoker former football coach at [employer] who comes in today for a Medicare wellness examination  Is a history of BPH for which she did take Cardura but is not taking it now. Didn't think it really helped that much  He uses Viagra p.m. for ED  We switched him from Ambien to trazodone 100 mg each bedtime for sleep dysfunction he sleeping well  He gets routine eye care, dental care, last colonoscopy showed one polyp.  Vaccinations updated by Apolonio Schneiders  Cognitive function normal he exercises on a daily basis home health safety reviewed no issues identified, no guns in the house, he does have a health care power of attorney and living well  Previously noted to have a left carotid bruit asymptomatic   Review of Systems  Constitutional: Negative.   HENT: Negative.   Eyes: Negative.   Respiratory: Negative.   Cardiovascular: Negative.   Gastrointestinal: Negative.   Genitourinary: Negative.   Musculoskeletal: Negative.   Skin: Negative.   Neurological: Negative.   Psychiatric/Behavioral: Negative.        Objective:   Physical Exam  Nursing note and vitals reviewed. Constitutional: He is oriented to person, place, and time. He appears well-developed and well-nourished.  HENT:  Head: Normocephalic and atraumatic.  Right Ear: External ear normal.  Left Ear: External ear normal.  Nose: Nose normal.  Mouth/Throat: Oropharynx is clear and moist.  Eyes: Conjunctivae and EOM are normal. Pupils are equal, round, and reactive to light.  Neck: Normal range of motion. Neck supple. No JVD present. No tracheal deviation present. No thyromegaly present.  Cardiovascular: Normal rate, regular rhythm, normal heart sounds and intact distal pulses.  Exam reveals no gallop and no friction rub.   No murmur heard. Right carotid normal left carotid  very soft bruit last ultrasound 4 years ago.  Aorta normal peripheral pulses 2+ and symmetrical  Pulmonary/Chest: Effort normal and breath sounds normal. No stridor. No respiratory distress. He has no wheezes. He has no rales. He exhibits no tenderness.  Abdominal: Soft. Bowel sounds are normal. He exhibits no distension and no mass. There is no tenderness. There is no rebound and no guarding.  Genitourinary: Rectum normal and penis normal. Guaiac negative stool. No penile tenderness.  2+ symmetrical nonnodular BPH  Musculoskeletal: Normal range of motion. He exhibits no edema and no tenderness.  Lymphadenopathy:    He has no cervical adenopathy.  Neurological: He is alert and oriented to person, place, and time. He has normal reflexes. No cranial nerve deficit. He exhibits normal muscle tone.  Skin: Skin is warm and dry. No rash noted. No erythema. No pallor.  Total body skin exam normal  Psychiatric: He has a normal mood and affect. His behavior is normal. Judgment and thought content normal.          Assessment & Plan:  Healthy male  BPH relatively asymptomatic no therapy at this time  Erectile dysfunction continue Viagra  Sleep dysfunction continue trazodone 100 mg each bedtime  Status post left knee surgery  Status post right shoulder surgery  Left carotid bruit real percent,,,,,,, asymptomatic

## 2013-10-17 NOTE — Progress Notes (Signed)
Pre visit review using our clinic review tool, if applicable. No additional management support is needed unless otherwise documented below in the visit note. 

## 2014-02-16 ENCOUNTER — Telehealth: Payer: Self-pay | Admitting: Family Medicine

## 2014-02-16 NOTE — Telephone Encounter (Signed)
Spoke with patient and he does not want a prescription at this time.  He will watch for swelling, pain, drainage, or fever and come into the office as needed.

## 2014-02-16 NOTE — Telephone Encounter (Signed)
Left message on machine for patient to return our call 

## 2014-02-16 NOTE — Telephone Encounter (Signed)
Patient Information:  Caller Name: Perry Nielsen  Phone: [phone]  Patient: Perry Nielsen, Perry Nielsen  Gender: Male  DOB: May 20, 1941  Age: 73 Years  PCP: Stevie Kern [facility])  Office Follow Up:  Does the office need to follow up with this patient?: Yes  Instructions For The Office: Patient has removed fish hook by himself. He cut off the barb and ran it through his hand. Denies dirt , pain or discomfort. Tetanus up to date.  Please review with Dr. Sherren Mocha .Does patient need antibiotic or evaluation. Would like guidance and does not want to come to office unless needed.   Please review.  RN Note:  Patient has removed fish hook by himself. He cut off the barb and ran it through his hand. Denies dirt , pain or discomfort. Tetanus up to date.  Please review with Dr. Sherren Mocha .Does patient need antibiotic or evaluation. Would like guidance and does not want to come to office unless needed.   Please review.  Symptoms  Reason For Call & Symptoms: Patient fishing today.  He got a  fish hook caught in his right hand. Location-  palm side  below middle finger and index finger .   He cut the barb and pulled the hook on through.  His Tetanus immunization is up to date 10/17/2013.  Very little bleeding, no soreness..  There is no dirt at wound entrance or exit.  Reviewed Health History In EMR: Yes  Reviewed Medications In EMR: Yes  Reviewed Allergies In EMR: Yes  Reviewed Surgeries / Procedures: Yes  Date of Onset of Symptoms: 02/16/2014  Treatments Tried: hand sanitizer with alcohol  Treatments Tried Worked: No  Guideline(s) Used:  Skin Foreign Body  Puncture Wound  Disposition Per Guideline:   Go to ED Now (or to Office with PCP Approval)  Reason For Disposition Reached:   FB has a barb (e.g., fish hook)  Advice Given:  Antibiotic Ointment:   Apply an antibiotic ointment (OTC) to the area once after removal to reduce the risk of infection.  Cleaning:  Wash the wound with soap and warm  water for 15 minutes.  If there is any dirt or debris, scrub the wound back and forth with a washcloth to remove it.  Pain Medicines:  For pain relief, you can take either acetaminophen, ibuprofen, or naproxen.  They are over-the-counter (OTC) pain drugs. You can buy them at the drugstore.  Acetaminophen (e.g., Tylenol):  Regular Strength Tylenol: Take 650 mg (two 325 mg pills) by mouth every 4-6 hours as needed. Each Regular Strength Tylenol pill has 325 mg of acetaminophen.  Extra Strength Tylenol: Take 1,000 mg (two 500 mg pills) every 8 hours as needed. Each Extra Strength Tylenol pill has 500 mg of acetaminophen.  The most you should take each day is 3,000 mg (10 Regular Strength or 6 Extra Strength pills a day).  Ibuprofen (e.g., Motrin, Advil):  Take 400 mg (two 200 mg pills) by mouth every 6 hours.  Another choice is to take 600 mg (three 200 mg pills) by mouth every 8 hours.  The most you should take each day is 1,200 mg (six 200 mg pills), unless your doctor has told you to take more.  Call Back If:  Dirt is still present in the wound after scrubbing  It begins to look infected (redness, red streaks, tenderness, pus, fever)  RN Overrode Recommendation:  Document Patient  Patient has removed fish hook by himself. He cut off the barb  and ran it through his hand. Denies dirt , pain or discomfort. Tetanus up to date.  Please review with Dr. Sherren Mocha .Does patient need antibiotic or evaluation. Would like guidance and does not want to come to office unless needed.   Please review.

## 2014-04-03 ENCOUNTER — Ambulatory Visit (INDEPENDENT_AMBULATORY_CARE_PROVIDER_SITE_OTHER): Payer: Medicare Other

## 2014-04-03 DIAGNOSIS — Z23 Encounter for immunization: Secondary | ICD-10-CM

## 2014-04-27 ENCOUNTER — Encounter: Payer: Self-pay | Admitting: Internal Medicine

## 2014-04-27 ENCOUNTER — Ambulatory Visit (INDEPENDENT_AMBULATORY_CARE_PROVIDER_SITE_OTHER): Payer: Medicare Other | Admitting: Internal Medicine

## 2014-04-27 VITALS — BP 130/68 | HR 65 | Temp 98.2°F | Resp 20 | Ht 72.0 in | Wt 180.0 lb

## 2014-04-27 DIAGNOSIS — J3089 Other allergic rhinitis: Secondary | ICD-10-CM

## 2014-04-27 DIAGNOSIS — H9201 Otalgia, right ear: Secondary | ICD-10-CM

## 2014-04-27 MED ORDER — NEOMYCIN-POLYMYXIN-HC 3.5-10000-1 OT SOLN: 3.0000 [drp] | Freq: Four times a day (QID) | OTIC | Status: DC

## 2014-04-27 NOTE — Progress Notes (Signed)
Subjective:    Patient ID: Perry Nielsen, male    DOB: Apr 11, 1941, 73 y.o.   MRN: [id number]  HPI 73 year old patient who presents with a chief complaint of right ear pain of 4 days' duration.  Prior to that ear pain.  He was outdoors in the cold and wind fishing and also does considerable indoor swimming.  There's been no urine loss or drainage.  He also describes a discomfort in the right TMJ area. He does have a history of allergic rhinitis and admits to nightly use of Afrin.  Past Medical History  Diagnosis Date  . 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 ALLERGIC RHINITIS 08/03/2007  . GERD 02/18/2007  . BENIGN PROSTATIC HYPERTROPHY 02/18/2007  . KNEE PAIN, LEFT, CHRONIC 08/07/2008  . BACK PAIN 09/18/2009  . HAMMER TOE 08/03/2007  . 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 CAROTID BRUIT, LEFT 08/07/2008  . Abdominal pain, right upper quadrant 09/18/2009    History   Social History  . Marital Status: Married    Spouse Name: N/A    Number of Children: N/A  . Years of Education: N/A   Occupational History  . Not on file.   Social History Main Topics  . Smoking status: Never Smoker   . Smokeless tobacco: Not on file  . Alcohol Use: 4.2 oz/week    7 Cans of beer per week  . Drug Use: No  . Sexual Activity:    Other Topics Concern  . Not on file   Social History Narrative  . No narrative on file    Past Surgical History  Procedure Laterality Date  . Shoulder open rotator cuff repair  07/06/05    right  . Knee arthroscopy      left  . Tonsillectomy and adenoidectomy    . Prostatectomy      Family History  Problem Relation Age of Onset  . Heart attack Other   . Aneurysm Other     aortic  . Coronary artery disease Other   . Cancer Other     prostate    No Known Allergies  Current Outpatient Prescriptions on File Prior to Visit  Medication Sig Dispense Refill  . aspirin 81 MG tablet Take 81 mg by mouth daily.        . fish oil-omega-3 fatty  acids 1000 MG capsule Take 2 g by mouth daily.        Marland Kitchen glucosamine-chondroitin 500-400 MG tablet Take 1 tablet by mouth 3 (three) times daily.        Marland Kitchen ibuprofen (ADVIL,MOTRIN) 100 MG chewable tablet Chew 100 mg by mouth every 8 (eight) hours as needed for fever.      . Multiple Vitamin (MULTIVITAMIN) tablet Take 1 tablet by mouth daily.        . psyllium (METAMUCIL) 0.52 G capsule Take 0.52 g by mouth daily.        . sildenafil (VIAGRA) 100 MG tablet Take 0.5-1 tablets (50-100 mg total) by mouth daily as needed for erectile dysfunction.  10 tablet  5  . sildenafil (VIAGRA) 100 MG tablet Take 1 tablet (100 mg total) by mouth as needed for erectile dysfunction.  10 tablet  11  . traZODone (DESYREL) 100 MG tablet Take 1 tablet (100 mg total) by mouth at bedtime.  100 tablet  3   No current facility-administered medications on file prior to visit.    BP 130/68  Pulse 65  Temp(Src) 98.2 F (36.8 C) (Oral)  Resp 20  Ht 6' (1.829 m)  Wt 180 lb (81.647 kg)  BMI 24.41 kg/m2  SpO2 98%      Review of Systems  Constitutional: Negative for fever, chills, appetite change and fatigue.  HENT: Positive for ear pain. Negative for congestion, dental problem, hearing loss, sore throat, tinnitus, trouble swallowing and voice change.   Eyes: Negative for pain, discharge and visual disturbance.  Respiratory: Negative for cough, chest tightness, wheezing and stridor.   Cardiovascular: Negative for chest pain, palpitations and leg swelling.  Gastrointestinal: Negative for nausea, vomiting, abdominal pain, diarrhea, constipation, blood in stool and abdominal distention.  Genitourinary: Negative for urgency, hematuria, flank pain, discharge, difficulty urinating and genital sores.  Musculoskeletal: Negative for arthralgias, back pain, gait problem, joint swelling, myalgias and neck stiffness.  Skin: Negative for rash.  Neurological: Negative for dizziness, syncope, speech difficulty, weakness, numbness  and headaches.  Hematological: Negative for adenopathy. Does not bruise/bleed easily.  Psychiatric/Behavioral: Negative for behavioral problems and dysphoric mood. The patient is not nervous/anxious.        Objective:   Physical Exam  Constitutional: He is oriented to person, place, and time. He appears well-developed.  HENT:  Head: Normocephalic.  Right Ear: External ear normal.  Left Ear: External ear normal.  The tympanic membranes were slightly dull, but not inflamed.  Canals appear normal And free of cerumen. Weber did not lateralize  Tenderness over the right TMJ area  Eyes: Conjunctivae and EOM are normal.  Neck: Normal range of motion.  Cardiovascular: Normal rate and normal heart sounds.   Pulmonary/Chest: Breath sounds normal.  Abdominal: Bowel sounds are normal.  Musculoskeletal: He exhibits no edema and no tenderness.  Neurological: He is alert and oriented to person, place, and time.  Psychiatric: He has a normal mood and affect. His behavior is normal.          Assessment & Plan:   Right-sided otalgia.  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  Cannot exclude eustachian tube dysfunction.  Patient has a history of allergic rhinitis and daily use of Afrin.  Attempts will be made to taper and discontinue.  He will be placed on fluticasone and Allegra daily. Advil when necessary  Was given a prescription for Cortisporin Otic if symptoms worsen.  Does not appear to be an ear infection at this stage

## 2014-04-27 NOTE — Progress Notes (Signed)
Pre visit review using our clinic review tool, if applicable. No additional management support is needed unless otherwise documented below in the visit note. 

## 2014-04-27 NOTE — Patient Instructions (Addendum)
Serous Otitis Media Serous otitis media is fluid in the middle ear space. This space contains the bones for hearing and air. Air in the middle ear space helps to transmit sound.  The air gets there through the eustachian tube. This tube goes from the back of the nose (nasopharynx) to the middle ear space. It keeps the pressure in the middle ear the same as the outside world. It also helps to drain fluid from the middle ear space. CAUSES  Serous otitis media occurs when the eustachian tube gets blocked. Blockage can come from:  Ear infections.  Colds and other upper respiratory infections.  Allergies.  Irritants such as cigarette smoke.  Sudden changes in air pressure (such as descending in an airplane).  Enlarged adenoids.  A mass in the nasopharynx. During colds and upper respiratory infections, the middle ear space can become temporarily filled with fluid. This can happen after an ear infection also. Once the infection clears, the fluid will generally drain out of the ear through the eustachian tube. If it does not, then serous otitis media occurs. SIGNS AND SYMPTOMS   Hearing loss.  A feeling of fullness in the ear, without pain.  Young children may not show any symptoms but may show slight behavioral changes, such as agitation, ear pulling, or crying. DIAGNOSIS  Serous otitis media is diagnosed by an ear exam. Tests may be done to check on the movement of the eardrum. Hearing exams may also be done. TREATMENT  The fluid most often goes away without treatment. If allergy is the cause, allergy treatment may be helpful. Fluid that persists for several months may require minor surgery. A small tube is placed in the eardrum to:  Drain the fluid.  Restore the air in the middle ear space. In certain situations, antibiotic medicines are used to avoid surgery. Surgery may be done to remove enlarged adenoids (if this is the cause). HOME CARE INSTRUCTIONS   Keep children away from  tobacco smoke.  Keep all follow-up visits as directed by your health care provider. SEEK MEDICAL CARE IF:   Your hearing is not better in 3 months.  Your hearing is worse.  You have ear pain.  You have drainage from the ear.  You have dizziness.  You have serous otitis media only in one ear or have any bleeding from your nose (epistaxis).  You notice a lump on your neck. MAKE SURE YOU:  Understand these instructions.   Will watch your condition.   Will get help right away if you are not doing well or get worse.  Document Released: 09/12/2003 Document Revised: 11/06/2013 Document Reviewed: 01/17/2013 [facility] Patient Information 2015 [place], [place]. This information is not intended to replace advice given to you by your health care provider. Make sure you discuss any questions you have with your health care provider.   Taper and discontinue Afrin use Start fluticasone nasal spray daily Use a nonsedating antihistamine such as Claritin or Allegra once daily  Use eardrops if pain worsens or you develop any drainage

## 2014-07-19 ENCOUNTER — Telehealth: Payer: Self-pay | Admitting: Family Medicine

## 2014-07-19 MED ORDER — TRAZODONE HCL 150 MG PO TABS: 150.0000 mg | ORAL_TABLET | Freq: Every day | ORAL | Status: DC

## 2014-07-19 NOTE — Telephone Encounter (Signed)
Per Dr Sherren Mocha patient should increase his trazodone to 150 mg.  Wife is aware.  New Rx sent.

## 2014-07-19 NOTE — Telephone Encounter (Signed)
Pt is calling to let md know that trazodone is not longer working. Pt decline appt. [facility]

## 2014-07-25 ENCOUNTER — Other Ambulatory Visit: Payer: Self-pay | Admitting: Dermatology

## 2014-07-25 DIAGNOSIS — L821 Other seborrheic keratosis: Secondary | ICD-10-CM | POA: Diagnosis not present

## 2014-07-25 DIAGNOSIS — L719 Rosacea, unspecified: Secondary | ICD-10-CM | POA: Diagnosis not present

## 2014-07-25 DIAGNOSIS — D239 Other benign neoplasm of skin, unspecified: Secondary | ICD-10-CM | POA: Diagnosis not present

## 2014-07-25 DIAGNOSIS — L570 Actinic keratosis: Secondary | ICD-10-CM | POA: Diagnosis not present

## 2014-07-25 DIAGNOSIS — D485 Neoplasm of uncertain behavior of skin: Secondary | ICD-10-CM | POA: Diagnosis not present

## 2014-09-25 DIAGNOSIS — H26493 Other secondary cataract, bilateral: Secondary | ICD-10-CM | POA: Diagnosis not present

## 2014-10-23 ENCOUNTER — Ambulatory Visit (INDEPENDENT_AMBULATORY_CARE_PROVIDER_SITE_OTHER): Payer: Medicare Other | Admitting: Family Medicine

## 2014-10-23 ENCOUNTER — Encounter: Payer: Self-pay | Admitting: Family Medicine

## 2014-10-23 VITALS — BP 130/80 | Temp 97.8°F | Ht 71.5 in | Wt 173.0 lb

## 2014-10-23 DIAGNOSIS — K219 Gastro-esophageal reflux disease without esophagitis: Secondary | ICD-10-CM

## 2014-10-23 DIAGNOSIS — N401 Enlarged prostate with lower urinary tract symptoms: Secondary | ICD-10-CM | POA: Diagnosis not present

## 2014-10-23 DIAGNOSIS — R351 Nocturia: Secondary | ICD-10-CM | POA: Diagnosis not present

## 2014-10-23 DIAGNOSIS — Z0000 Encounter for general adult medical examination without abnormal findings: Secondary | ICD-10-CM | POA: Diagnosis not present

## 2014-10-23 DIAGNOSIS — F528 Other sexual dysfunction not due to a substance or known physiological condition: Secondary | ICD-10-CM

## 2014-10-23 DIAGNOSIS — Z79899 Other long term (current) drug therapy: Secondary | ICD-10-CM | POA: Diagnosis not present

## 2014-10-23 LAB — CBC WITH DIFFERENTIAL/PLATELET
Basophils Absolute: 0 10*3/uL (ref 0.0–0.1)
Basophils Relative: 0.6 % (ref 0.0–3.0)
EOS ABS: 0.3 10*3/uL (ref 0.0–0.7)
Eosinophils Relative: 4.1 % (ref 0.0–5.0)
HCT: 47.1 % (ref 39.0–52.0)
Hemoglobin: 15.8 g/dL (ref 13.0–17.0)
LYMPHS ABS: 1.5 10*3/uL (ref 0.7–4.0)
Lymphocytes Relative: 22.6 % (ref 12.0–46.0)
MCHC: 33.6 g/dL (ref 30.0–36.0)
MCV: 92.7 fl (ref 78.0–100.0)
MONO ABS: 0.6 10*3/uL (ref 0.1–1.0)
Monocytes Relative: 10 % (ref 3.0–12.0)
NEUTROS PCT: 62.7 % (ref 43.0–77.0)
Neutro Abs: 4 10*3/uL (ref 1.4–7.7)
PLATELETS: 137 10*3/uL — AB (ref 150.0–400.0)
RBC: 5.08 Mil/uL (ref 4.22–5.81)
RDW: 13.4 % (ref 11.5–15.5)
WBC: 6.4 10*3/uL (ref 4.0–10.5)

## 2014-10-23 LAB — POCT URINALYSIS DIPSTICK
BILIRUBIN UA: NEGATIVE
Blood, UA: NEGATIVE
Glucose, UA: NEGATIVE
KETONES UA: NEGATIVE
Leukocytes, UA: NEGATIVE
Nitrite, UA: NEGATIVE
Protein, UA: NEGATIVE
Spec Grav, UA: 1.02
Urobilinogen, UA: 0.2
pH, UA: 5.5

## 2014-10-23 LAB — LIPID PANEL
CHOL/HDL RATIO: 4
Cholesterol: 184 mg/dL (ref 0–200)
HDL: 43.5 mg/dL (ref >39.00)
LDL CALC: 117 mg/dL — AB (ref 0–99)
NonHDL: 140.5
TRIGLYCERIDES: 116 mg/dL (ref 0.0–149.0)
VLDL: 23.2 mg/dL (ref 0.0–40.0)

## 2014-10-23 LAB — HEPATIC FUNCTION PANEL
ALT: 16 U/L (ref 0–53)
AST: 19 U/L (ref 0–37)
Albumin: 4.2 g/dL (ref 3.5–5.2)
Alkaline Phosphatase: 75 U/L (ref 39–117)
Bilirubin, Direct: 0.1 mg/dL (ref 0.0–0.3)
Total Bilirubin: 0.7 mg/dL (ref 0.2–1.2)
Total Protein: 6.7 g/dL (ref 6.0–8.3)

## 2014-10-23 LAB — BASIC METABOLIC PANEL
BUN: 16 mg/dL (ref 6–23)
CALCIUM: 9.6 mg/dL (ref 8.4–10.5)
CO2: 31 mEq/L (ref 19–32)
Chloride: 107 mEq/L (ref 96–112)
Creatinine, Ser: 1.07 mg/dL (ref 0.40–1.50)
GFR: 71.96 mL/min (ref >60.00)
Glucose, Bld: 87 mg/dL (ref 70–99)
POTASSIUM: 5 meq/L (ref 3.5–5.1)
Sodium: 141 mEq/L (ref 135–145)

## 2014-10-23 LAB — PSA: PSA: 3.2 ng/mL (ref 0.10–4.00)

## 2014-10-23 LAB — TSH: TSH: 3.78 u[IU]/mL (ref 0.35–4.50)

## 2014-10-23 MED ORDER — SILDENAFIL CITRATE 100 MG PO TABS: 50.0000 mg | ORAL_TABLET | Freq: Every day | ORAL | Status: DC | PRN

## 2014-10-23 MED ORDER — TRAZODONE HCL 150 MG PO TABS: 150.0000 mg | ORAL_TABLET | Freq: Every day | ORAL | Status: DC

## 2014-10-23 NOTE — Progress Notes (Signed)
Pre visit review using our clinic review tool, if applicable. No additional management support is needed unless otherwise documented below in the visit note. 

## 2014-10-23 NOTE — Progress Notes (Signed)
   Subjective:    Patient ID: Perry Nielsen, male    DOB: Feb 06, 1941, 74 y.o.   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 He's not sure when he is due to go back for recall.  Vaccinations up-to-date  Cognitive function normal he exercises on a daily basis......Marland Kitchen plays golf 3 days a week and swims the other 4... Home health safety reviewed no issues identified, no guns in the house, he does have a healthcare power of attorney and living well....   Review of Systems  Constitutional: Negative.   HENT: Negative.   Eyes: Negative.   Respiratory: Negative.   Cardiovascular: Negative.   Gastrointestinal: Negative.   Endocrine: Negative.   Genitourinary: Negative.   Musculoskeletal: Negative.   Skin: Negative.   Allergic/Immunologic: Negative.   Neurological: Negative.   Hematological: Negative.   Psychiatric/Behavioral: Negative.        Objective:   Physical Exam  Constitutional: He is oriented to person, place, and time. He appears well-developed and well-nourished.  HENT:  Head: Normocephalic and atraumatic.  Right Ear: External ear normal.  Left Ear: External ear normal.  Nose: Nose normal.  Mouth/Throat: Oropharynx is clear and moist.  Eyes: Conjunctivae and EOM are normal. Pupils are equal, round, and reactive to light.  Neck: Normal range of motion. Neck supple. No JVD present. No tracheal deviation present. No thyromegaly present.  Cardiovascular: Normal rate, regular rhythm, normal heart sounds and intact distal pulses.  Exam reveals no gallop and no friction rub.   No murmur heard. No carotid nor aortic bruits  peripheral pulses 2+ and symmetrical  Pulmonary/Chest: Effort normal and breath sounds normal. No stridor. No respiratory distress. He has no wheezes. He has no rales. He exhibits no tenderness.  Abdominal: Soft. Bowel sounds are normal. He exhibits no distension and no mass. There is no tenderness. There is no rebound and no guarding.  Genitourinary: Rectum normal and penis normal. Guaiac negative stool. No penile tenderness.  2+ symmetrical nonnodular BPH  Musculoskeletal: Normal range of motion. He exhibits no edema or tenderness.  Lymphadenopathy:    He has no cervical adenopathy.  Neurological: He is alert and oriented to person, place, and time. He has normal reflexes. No cranial nerve deficit. He exhibits normal muscle tone.  Skin: Skin is warm and dry. No rash noted. No erythema. No pallor.  Total body skin exam normal  Psychiatric: He has a normal mood and affect. His behavior is normal. Judgment and thought content normal.  Nursing note and vitals reviewed.         Assessment & PlaHealthy male  History of ED..........Marland Kitchen refill Viagra n:  . Healthy male  History of ED..... Refill Viagra  BPH...... check labs

## 2014-10-23 NOTE — Patient Instructions (Signed)
Continue current medications  Labs today........ I will call you the report  Rachel's extension is [place].......Marland Kitchen our new adult nurse practitioner

## 2014-11-22 DIAGNOSIS — M9903 Segmental and somatic dysfunction of lumbar region: Secondary | ICD-10-CM | POA: Diagnosis not present

## 2014-11-22 DIAGNOSIS — M545 Low back pain: Secondary | ICD-10-CM | POA: Diagnosis not present

## 2014-11-22 DIAGNOSIS — M62830 Muscle spasm of back: Secondary | ICD-10-CM | POA: Diagnosis not present

## 2014-11-27 DIAGNOSIS — M545 Low back pain: Secondary | ICD-10-CM | POA: Diagnosis not present

## 2014-11-27 DIAGNOSIS — M9901 Segmental and somatic dysfunction of cervical region: Secondary | ICD-10-CM | POA: Diagnosis not present

## 2014-11-27 DIAGNOSIS — M62830 Muscle spasm of back: Secondary | ICD-10-CM | POA: Diagnosis not present

## 2014-11-27 DIAGNOSIS — M9903 Segmental and somatic dysfunction of lumbar region: Secondary | ICD-10-CM | POA: Diagnosis not present

## 2014-11-28 DIAGNOSIS — M545 Low back pain: Secondary | ICD-10-CM | POA: Diagnosis not present

## 2014-11-28 DIAGNOSIS — M9903 Segmental and somatic dysfunction of lumbar region: Secondary | ICD-10-CM | POA: Diagnosis not present

## 2014-11-28 DIAGNOSIS — M9901 Segmental and somatic dysfunction of cervical region: Secondary | ICD-10-CM | POA: Diagnosis not present

## 2014-11-28 DIAGNOSIS — M62830 Muscle spasm of back: Secondary | ICD-10-CM | POA: Diagnosis not present

## 2014-11-29 DIAGNOSIS — M545 Low back pain: Secondary | ICD-10-CM | POA: Diagnosis not present

## 2014-11-29 DIAGNOSIS — M62830 Muscle spasm of back: Secondary | ICD-10-CM | POA: Diagnosis not present

## 2014-11-29 DIAGNOSIS — M9901 Segmental and somatic dysfunction of cervical region: Secondary | ICD-10-CM | POA: Diagnosis not present

## 2014-11-29 DIAGNOSIS — M9903 Segmental and somatic dysfunction of lumbar region: Secondary | ICD-10-CM | POA: Diagnosis not present

## 2014-12-04 DIAGNOSIS — M62830 Muscle spasm of back: Secondary | ICD-10-CM | POA: Diagnosis not present

## 2014-12-04 DIAGNOSIS — M9903 Segmental and somatic dysfunction of lumbar region: Secondary | ICD-10-CM | POA: Diagnosis not present

## 2014-12-04 DIAGNOSIS — M545 Low back pain: Secondary | ICD-10-CM | POA: Diagnosis not present

## 2014-12-04 DIAGNOSIS — M9901 Segmental and somatic dysfunction of cervical region: Secondary | ICD-10-CM | POA: Diagnosis not present

## 2014-12-07 DIAGNOSIS — M7071 Other bursitis of hip, right hip: Secondary | ICD-10-CM | POA: Diagnosis not present

## 2014-12-07 DIAGNOSIS — M5441 Lumbago with sciatica, right side: Secondary | ICD-10-CM | POA: Diagnosis not present

## 2014-12-20 DIAGNOSIS — M5441 Lumbago with sciatica, right side: Secondary | ICD-10-CM | POA: Diagnosis not present

## 2015-01-04 DIAGNOSIS — M7071 Other bursitis of hip, right hip: Secondary | ICD-10-CM | POA: Diagnosis not present

## 2015-01-11 ENCOUNTER — Ambulatory Visit (INDEPENDENT_AMBULATORY_CARE_PROVIDER_SITE_OTHER): Payer: Medicare Other | Admitting: Internal Medicine

## 2015-01-11 ENCOUNTER — Encounter: Payer: Self-pay | Admitting: Internal Medicine

## 2015-01-11 VITALS — BP 146/90 | HR 64 | Temp 98.1°F | Resp 20 | Ht 71.5 in | Wt 173.0 lb

## 2015-01-11 DIAGNOSIS — M549 Dorsalgia, unspecified: Secondary | ICD-10-CM

## 2015-01-11 MED ORDER — HYDROCODONE-ACETAMINOPHEN 5-325 MG PO TABS: 1.0000 | ORAL_TABLET | Freq: Four times a day (QID) | ORAL | Status: DC | PRN

## 2015-01-11 NOTE — Progress Notes (Signed)
Pre visit review using our clinic review tool, if applicable. No additional management support is needed unless otherwise documented below in the visit note. 

## 2015-01-11 NOTE — Progress Notes (Signed)
Subjective:    Patient ID: Perry Nielsen, male    DOB: 21-Feb-1941, 74 y.o.   MRN: [id number]  HPI  BP Readings from Last 3 Encounters:  01/11/15 146/90  10/23/14 130/80  04/27/14 12/58   74 year old patient who has been followed by orthopedics recently due to back and right leg pain.  He is seen here today concerned about possible restless leg syndrome.  He describes a throbbing pain in the right leg when he is supine at night but has very little difficulty throughout the day.  He also has some right hip discomfort.  Orthopedic surgery has considered both lumbar disc disease with some sciatica and also bursitis of the right hip area.  Past Medical History  Diagnosis Date  . 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 ALLERGIC RHINITIS 08/03/2007  . GERD 02/18/2007  . BENIGN PROSTATIC HYPERTROPHY 02/18/2007  . KNEE PAIN, LEFT, CHRONIC 08/07/2008  . BACK PAIN 09/18/2009  . HAMMER TOE 08/03/2007  . 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 CAROTID BRUIT, LEFT 08/07/2008  . Abdominal pain, right upper quadrant 09/18/2009    History   Social History  . Marital Status: Married    Spouse Name: N/A  . Number of Children: N/A  . Years of Education: N/A   Occupational History  . Not on file.   Social History Main Topics  . Smoking status: Never Smoker   . Smokeless tobacco: Not on file  . Alcohol Use: 4.2 oz/week    7 Cans of beer per week  . Drug Use: No  . Sexual Activity: Not on file   Other Topics Concern  . Not on file   Social History Narrative    Past Surgical History  Procedure Laterality Date  . Shoulder open rotator cuff repair  07/06/05    right  . Knee arthroscopy      left  . Tonsillectomy and adenoidectomy    . Prostatectomy      Family History  Problem Relation Age of Onset  . Heart attack Other   . Aneurysm Other     aortic  . Coronary artery disease Other   . Cancer Other     prostate    Allergies  Allergen Reactions  .  Antihistamines, Diphenhydramine-Type     CAUSES PROSTATE TO SWELL    Current Outpatient Prescriptions on File Prior to Visit  Medication Sig Dispense Refill  . aspirin 81 MG tablet Take 81 mg by mouth daily.      . fish oil-omega-3 fatty acids 1000 MG capsule Take 2 g by mouth daily.      Marland Kitchen glucosamine-chondroitin 500-400 MG tablet Take 1 tablet by mouth 3 (three) times daily.      Marland Kitchen ibuprofen (ADVIL,MOTRIN) 100 MG chewable tablet Chew 100 mg by mouth every 8 (eight) hours as needed for fever.    . Multiple Vitamin (MULTIVITAMIN) tablet Take 1 tablet by mouth daily.      . psyllium (METAMUCIL) 0.52 G capsule Take 0.52 g by mouth daily.      . sildenafil (VIAGRA) 100 MG tablet Take 0.5-1 tablets (50-100 mg total) by mouth daily as needed for erectile dysfunction. 10 tablet 10  . traZODone (DESYREL) 150 MG tablet Take 1 tablet (150 mg total) by mouth at bedtime. 90 tablet 3  . sildenafil (VIAGRA) 100 MG tablet Take 1 tablet (100 mg total) by mouth as needed for erectile dysfunction. 10 tablet 11   No current facility-administered medications on file  prior to visit.    BP 146/90 mmHg  Pulse 64  Temp(Src) 98.1 F (36.7 C) (Oral)  Resp 20  Ht 5' 11.5" (1.816 m)  Wt 173 lb (78.472 kg)  BMI 23.79 kg/m2  SpO2 97%    Review of Systems  Constitutional: Negative for fever, chills, appetite change and fatigue.  HENT: Negative for congestion, dental problem, ear pain, hearing loss, sore throat, tinnitus, trouble swallowing and voice change.   Eyes: Negative for pain, discharge and visual disturbance.  Respiratory: Negative for cough, chest tightness, wheezing and stridor.   Cardiovascular: Negative for chest pain, palpitations and leg swelling.  Gastrointestinal: Negative for nausea, vomiting, abdominal pain, diarrhea, constipation, blood in stool and abdominal distention.  Genitourinary: Negative for urgency, hematuria, flank pain, discharge, difficulty urinating and genital sores.    Musculoskeletal: Positive for back pain. Negative for myalgias, joint swelling, arthralgias, gait problem and neck stiffness.  Skin: Negative for rash.  Neurological: Negative for dizziness, syncope, speech difficulty, weakness, numbness and headaches.  Hematological: Negative for adenopathy. Does not bruise/bleed easily.  Psychiatric/Behavioral: Negative for behavioral problems and dysphoric mood. The patient is not nervous/anxious.        Objective:   Physical Exam  Constitutional: He appears well-developed and well-nourished. No distress.  Musculoskeletal: He exhibits no edema or tenderness.          Assessment & Plan:   Doubt restless leg syndrome.  Discomfort affects right leg only.  He had no symptoms last night.  We'll continue with orthopedic evaluation and treatment and observe

## 2015-01-11 NOTE — Patient Instructions (Signed)
Report any new or worsening symptoms  Limit your sodium (Salt) intake  Please check your blood pressure on a regular basis.  If it is consistently greater than 150/90, please make an office appointment.

## 2015-01-15 ENCOUNTER — Encounter (HOSPITAL_COMMUNITY): Payer: Self-pay | Admitting: Family Medicine

## 2015-01-15 ENCOUNTER — Emergency Department (HOSPITAL_COMMUNITY): Payer: Medicare Other

## 2015-01-15 ENCOUNTER — Emergency Department (HOSPITAL_COMMUNITY)
Admission: EM | Admit: 2015-01-15 | Discharge: 2015-01-15 | Disposition: A | Discharge status: Home / Self Care | Payer: Medicare Other | Attending: Emergency Medicine | Admitting: Emergency Medicine

## 2015-01-15 DIAGNOSIS — Z87448 Personal history of other diseases of urinary system: Secondary | ICD-10-CM | POA: Diagnosis not present

## 2015-01-15 DIAGNOSIS — Q742 Other congenital malformations of lower limb(s), including pelvic girdle: Secondary | ICD-10-CM | POA: Insufficient documentation

## 2015-01-15 DIAGNOSIS — Z8669 Personal history of other diseases of the nervous system and sense organs: Secondary | ICD-10-CM | POA: Insufficient documentation

## 2015-01-15 DIAGNOSIS — Z7951 Long term (current) use of inhaled steroids: Secondary | ICD-10-CM | POA: Diagnosis not present

## 2015-01-15 DIAGNOSIS — Z7982 Long term (current) use of aspirin: Secondary | ICD-10-CM | POA: Insufficient documentation

## 2015-01-15 DIAGNOSIS — M25552 Pain in left hip: Secondary | ICD-10-CM | POA: Insufficient documentation

## 2015-01-15 DIAGNOSIS — R52 Pain, unspecified: Secondary | ICD-10-CM | POA: Diagnosis not present

## 2015-01-15 DIAGNOSIS — M545 Low back pain: Secondary | ICD-10-CM | POA: Diagnosis not present

## 2015-01-15 DIAGNOSIS — M5416 Radiculopathy, lumbar region: Secondary | ICD-10-CM | POA: Diagnosis not present

## 2015-01-15 DIAGNOSIS — M546 Pain in thoracic spine: Secondary | ICD-10-CM | POA: Insufficient documentation

## 2015-01-15 DIAGNOSIS — Z8719 Personal history of other diseases of the digestive system: Secondary | ICD-10-CM | POA: Diagnosis not present

## 2015-01-15 DIAGNOSIS — F528 Other sexual dysfunction not due to a substance or known physiological condition: Secondary | ICD-10-CM | POA: Insufficient documentation

## 2015-01-15 DIAGNOSIS — Z79899 Other long term (current) drug therapy: Secondary | ICD-10-CM | POA: Diagnosis not present

## 2015-01-15 DIAGNOSIS — M47814 Spondylosis without myelopathy or radiculopathy, thoracic region: Secondary | ICD-10-CM | POA: Diagnosis not present

## 2015-01-15 DIAGNOSIS — M549 Dorsalgia, unspecified: Secondary | ICD-10-CM | POA: Diagnosis present

## 2015-01-15 MED ORDER — OXYCODONE-ACETAMINOPHEN 5-325 MG PO TABS
2.0000 | ORAL_TABLET | Freq: Once | ORAL | Status: AC
  Administered 2015-01-15: 2 via ORAL
  Filled 2015-01-15: qty 2

## 2015-01-15 MED ORDER — FENTANYL CITRATE (PF) 100 MCG/2ML IJ SOLN
100.0000 ug | Freq: Once | INTRAMUSCULAR | Status: AC
  Administered 2015-01-15: 100 ug via INTRAVENOUS
  Filled 2015-01-15: qty 2

## 2015-01-15 NOTE — ED Notes (Signed)
Patient complains of mid thoracic pain with tenderness. Pt states this occurred about two weeks and relieved with a warm soak. Today pain is worse. Then, Saturday patient reports this same [place]

## 2015-01-15 NOTE — ED Notes (Signed)
Bed: XT02 Expected date: 01/15/15 Expected time: 1:17 AM Means of arrival: Ambulance Comments: Thoracic back pain

## 2015-01-15 NOTE — ED Notes (Signed)
Patient would like to wait til 6:15 before attempting to be discharged. Pt reports pain has decreased but still is unsure if he can get up.

## 2015-01-15 NOTE — ED Notes (Signed)
Patient would like to wait for about 30-45 minutes before being discharged due to increased pain and unsure if he can get off the stretcher.

## 2015-01-15 NOTE — ED Provider Notes (Signed)
CSN: [id number]     Arrival date & time 01/15/15  0145 History   First MD Initiated Contact with Patient 01/15/15 0327     Chief Complaint  Patient presents with  . Back Pain     (Consider location/radiation/quality/duration/timing/severity/associated sxs/prior Treatment) HPI  This is a 74 year old male who has had some mild thoracic spinal pain for the past 2 weeks. Yesterday afternoon, while playing golf, he took a swing and felt a sudden pain in his mid thoracic spine. The pain has worsened overnight and is now severe. It is worse with movement or deep breathing. It is especially hard for him to sit up, even after taking the hydrocodone he was recently prescribed.   He was given fentanyl IV by EMS prior to arrival with partial, transient relief. There is no associated focal neurologic deficit. He has been having some left hip pain for which he has been treated for bursitis; that pain is mild compared to his back pain.  Past Medical History  Diagnosis Date  . 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 ALLERGIC RHINITIS 08/03/2007  . GERD 02/18/2007  . BENIGN PROSTATIC HYPERTROPHY 02/18/2007  . KNEE PAIN, LEFT, CHRONIC 08/07/2008  . BACK PAIN 09/18/2009  . HAMMER TOE 08/03/2007  . 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 CAROTID BRUIT, LEFT 08/07/2008  . Abdominal pain, right upper quadrant 09/18/2009   Past Surgical History  Procedure Laterality Date  . Shoulder open rotator cuff repair  07/06/05    right  . Knee arthroscopy      left  . Tonsillectomy and adenoidectomy    . Prostatectomy     Family History  Problem Relation Age of Onset  . Heart attack Other   . Aneurysm Other     aortic  . Coronary artery disease Other   . Cancer Other     prostate   History  Substance Use Topics  . Smoking status: Never Smoker   . Smokeless tobacco: Not on file  . Alcohol Use: Yes     Comment: 2-3 times a week. Beer     Review of Systems  All other systems reviewed and are  negative.   Allergies  Antihistamines, diphenhydramine-type  Home Medications   Prior to Admission medications   Medication Sig Start Date End Date Taking? Authorizing Provider  aspirin EC 81 MG tablet Take 81 mg by mouth daily.   Yes Historical Provider, MD  fish oil-omega-3 fatty acids 1000 MG capsule Take 2 g by mouth daily.     Yes Historical Provider, MD  fluticasone (FLONASE) 50 MCG/ACT nasal spray Place 1 spray into both nostrils every evening.   Yes Historical Provider, MD  glucosamine-chondroitin 500-400 MG tablet Take 2 tablets by mouth daily.    Yes Historical Provider, MD  HYDROcodone-acetaminophen (NORCO/VICODIN) 5-325 MG per tablet Take 1 tablet by mouth every 6 (six) hours as needed for moderate pain. 01/11/15  Yes Marletta Lor, MD  ibuprofen (ADVIL,MOTRIN) 200 MG tablet Take 400-800 mg by mouth every 6 (six) hours as needed (for pain.).   Yes Historical Provider, MD  Multiple Vitamin (MULTIVITAMIN) tablet Take 1 tablet by mouth daily.     Yes Historical Provider, MD  oxymetazoline (AFRIN) 0.05 % nasal spray Place 1 spray into both nostrils 2 (two) times daily as needed for congestion.   Yes Historical Provider, MD  psyllium (HYDROCIL/METAMUCIL) 95 % PACK Take 1 packet by mouth daily.   Yes Historical Provider, MD  sildenafil (VIAGRA) 100 MG  tablet Take 0.5-1 tablets (50-100 mg total) by mouth daily as needed for erectile dysfunction. 10/23/14  Yes Dorena Cookey, MD  tetrahydrozoline 0.05 % ophthalmic solution Place 1-2 drops into both eyes 4 (four) times daily as needed (for eye irritation).   Yes Historical Provider, MD  traZODone (DESYREL) 150 MG tablet Take 1 tablet (150 mg total) by mouth at bedtime. 10/23/14  Yes Dorena Cookey, MD   BP 127/73 mmHg  Pulse 57  Temp(Src) 97.8 F (36.6 C) (Oral)  Resp 20  Ht 6' (1.829 m)  Wt 175 lb (79.379 kg)  BMI 23.73 kg/m2  SpO2 97%   Physical Exam  General: Well-developed, well-nourished male in no acute distress;  appearance consistent with age of record HENT: normocephalic; atraumatic Eyes: pupils equal, round and reactive to light; extraocular muscles intact; lens implants Neck: supple Heart: regular rate and rhythm Lungs: clear to auscultation bilaterally Abdomen: soft; nondistended; nontender; bowel sounds present Back: Mild lower thoracic tenderness; pain on movement of upper back with limited range of motion Extremities: No deformity; full range of motion; pulses normal; mild tenderness over right greater trochanter Neurologic: Awake, alert and oriented; motor function intact in all extremities and symmetric; no facial droop Skin: Warm and dry Psychiatric: Normal mood and affect   ED Course  Procedures (including critical care time)   MDM  Nursing notes and vitals signs, including pulse oximetry, reviewed.  Summary of this visit's results, reviewed by myself:  Imaging Studies: Dg Thoracic Spine 2 View  01/15/2015   CLINICAL DATA:  No known injury. Increasing upper back pain for 72 hours.  EXAM: THORACIC SPINE - 2-3 VIEWS  COMPARISON:  None.  FINDINGS: Normal alignment of the thoracic spine. Diffuse degenerative change with narrowed thoracic interspaces and endplate hypertrophic changes. No vertebral compression deformities. No focal bone lesion or bone destruction. No paraspinal soft tissue swelling.  IMPRESSION: Degenerative changes in the thoracic spine. Normal alignment. No acute displaced fractures identified.   Electronically Signed   By: Lucienne Capers M.D.   On: 01/15/2015 04:33   Pain improved with IV fentanyl. Patient is concerned that his pain will not be adequately controlled at home. He was advised to follow-up with Dr. Rolena Infante at [place] to his artery evaluated him for his hip pain.      Shanon Rosser, MD 01/15/15 [id number]

## 2015-01-15 NOTE — ED Notes (Signed)
Patient is from home but transported to facility by [facility]. Patient is complaining of thoracic pain that started around 12:30 while ending his game of golf. Pt went home to rest and use heating pad. This mildly relieved the pain. At 23:00ish when going to bed. Pt took a HYDROCODONE and TRAZODONE for pain with no relief. EMS reports when patient goes from a supine position to sitting up, his pain is increased and hurts with breathing. PT had FENTANYL 150mg  IV en route to facility. Pt denies any injury and tenderness to palpitations.

## 2015-02-03 ENCOUNTER — Inpatient Hospital Stay (HOSPITAL_COMMUNITY)
Admission: EM | Admit: 2015-02-03 | Discharge: 2015-02-08 | DRG: 478 | Disposition: A | Discharge status: Home / Self Care | Payer: Medicare Other | Attending: Internal Medicine | Admitting: Internal Medicine

## 2015-02-03 ENCOUNTER — Encounter (HOSPITAL_COMMUNITY): Payer: Self-pay | Admitting: Emergency Medicine

## 2015-02-03 ENCOUNTER — Emergency Department (HOSPITAL_COMMUNITY): Payer: Medicare Other

## 2015-02-03 DIAGNOSIS — M8458XA Pathological fracture in neoplastic disease, other specified site, initial encounter for fracture: Secondary | ICD-10-CM | POA: Diagnosis not present

## 2015-02-03 DIAGNOSIS — R079 Chest pain, unspecified: Secondary | ICD-10-CM | POA: Diagnosis not present

## 2015-02-03 DIAGNOSIS — Z8249 Family history of ischemic heart disease and other diseases of the circulatory system: Secondary | ICD-10-CM

## 2015-02-03 DIAGNOSIS — D649 Anemia, unspecified: Secondary | ICD-10-CM | POA: Diagnosis not present

## 2015-02-03 DIAGNOSIS — J309 Allergic rhinitis, unspecified: Secondary | ICD-10-CM | POA: Diagnosis present

## 2015-02-03 DIAGNOSIS — C7951 Secondary malignant neoplasm of bone: Secondary | ICD-10-CM | POA: Diagnosis not present

## 2015-02-03 DIAGNOSIS — M1990 Unspecified osteoarthritis, unspecified site: Secondary | ICD-10-CM | POA: Diagnosis not present

## 2015-02-03 DIAGNOSIS — Z85828 Personal history of other malignant neoplasm of skin: Secondary | ICD-10-CM | POA: Diagnosis not present

## 2015-02-03 DIAGNOSIS — M488X6 Other specified spondylopathies, lumbar region: Secondary | ICD-10-CM | POA: Diagnosis not present

## 2015-02-03 DIAGNOSIS — M899 Disorder of bone, unspecified: Secondary | ICD-10-CM | POA: Diagnosis not present

## 2015-02-03 DIAGNOSIS — C61 Malignant neoplasm of prostate: Secondary | ICD-10-CM | POA: Diagnosis not present

## 2015-02-03 DIAGNOSIS — M545 Low back pain: Secondary | ICD-10-CM | POA: Diagnosis not present

## 2015-02-03 DIAGNOSIS — Z7982 Long term (current) use of aspirin: Secondary | ICD-10-CM | POA: Diagnosis not present

## 2015-02-03 DIAGNOSIS — R351 Nocturia: Secondary | ICD-10-CM | POA: Diagnosis not present

## 2015-02-03 DIAGNOSIS — R6889 Other general symptoms and signs: Secondary | ICD-10-CM | POA: Diagnosis not present

## 2015-02-03 DIAGNOSIS — M549 Dorsalgia, unspecified: Secondary | ICD-10-CM | POA: Diagnosis not present

## 2015-02-03 DIAGNOSIS — J3089 Other allergic rhinitis: Secondary | ICD-10-CM

## 2015-02-03 DIAGNOSIS — I2510 Atherosclerotic heart disease of native coronary artery without angina pectoris: Secondary | ICD-10-CM | POA: Diagnosis not present

## 2015-02-03 DIAGNOSIS — E440 Moderate protein-calorie malnutrition: Secondary | ICD-10-CM | POA: Diagnosis present

## 2015-02-03 DIAGNOSIS — C495 Malignant neoplasm of connective and soft tissue of pelvis: Secondary | ICD-10-CM | POA: Diagnosis not present

## 2015-02-03 DIAGNOSIS — Z8042 Family history of malignant neoplasm of prostate: Secondary | ICD-10-CM | POA: Diagnosis not present

## 2015-02-03 DIAGNOSIS — Z79899 Other long term (current) drug therapy: Secondary | ICD-10-CM

## 2015-02-03 DIAGNOSIS — N401 Enlarged prostate with lower urinary tract symptoms: Secondary | ICD-10-CM | POA: Diagnosis not present

## 2015-02-03 DIAGNOSIS — C799 Secondary malignant neoplasm of unspecified site: Secondary | ICD-10-CM

## 2015-02-03 DIAGNOSIS — Z510 Encounter for antineoplastic radiation therapy: Secondary | ICD-10-CM | POA: Diagnosis not present

## 2015-02-03 DIAGNOSIS — M79604 Pain in right leg: Secondary | ICD-10-CM | POA: Diagnosis not present

## 2015-02-03 DIAGNOSIS — M7989 Other specified soft tissue disorders: Secondary | ICD-10-CM | POA: Diagnosis not present

## 2015-02-03 DIAGNOSIS — Z8049 Family history of malignant neoplasm of other genital organs: Secondary | ICD-10-CM

## 2015-02-03 DIAGNOSIS — D696 Thrombocytopenia, unspecified: Secondary | ICD-10-CM | POA: Diagnosis present

## 2015-02-03 DIAGNOSIS — M4850XA Collapsed vertebra, not elsewhere classified, site unspecified, initial encounter for fracture: Secondary | ICD-10-CM | POA: Diagnosis not present

## 2015-02-03 DIAGNOSIS — G8929 Other chronic pain: Secondary | ICD-10-CM | POA: Diagnosis present

## 2015-02-03 DIAGNOSIS — M4806 Spinal stenosis, lumbar region: Secondary | ICD-10-CM | POA: Diagnosis not present

## 2015-02-03 DIAGNOSIS — G589 Mononeuropathy, unspecified: Secondary | ICD-10-CM | POA: Diagnosis present

## 2015-02-03 DIAGNOSIS — N281 Cyst of kidney, acquired: Secondary | ICD-10-CM | POA: Diagnosis not present

## 2015-02-03 DIAGNOSIS — Z6822 Body mass index (BMI) 22.0-22.9, adult: Secondary | ICD-10-CM | POA: Diagnosis not present

## 2015-02-03 DIAGNOSIS — N400 Enlarged prostate without lower urinary tract symptoms: Secondary | ICD-10-CM | POA: Diagnosis not present

## 2015-02-03 DIAGNOSIS — M21379 Foot drop, unspecified foot: Secondary | ICD-10-CM | POA: Diagnosis present

## 2015-02-03 DIAGNOSIS — K219 Gastro-esophageal reflux disease without esophagitis: Secondary | ICD-10-CM | POA: Diagnosis present

## 2015-02-03 DIAGNOSIS — M21371 Foot drop, right foot: Secondary | ICD-10-CM | POA: Diagnosis present

## 2015-02-03 DIAGNOSIS — Z9079 Acquired absence of other genital organ(s): Secondary | ICD-10-CM | POA: Diagnosis not present

## 2015-02-03 DIAGNOSIS — C801 Malignant (primary) neoplasm, unspecified: Secondary | ICD-10-CM | POA: Diagnosis not present

## 2015-02-03 DIAGNOSIS — R161 Splenomegaly, not elsewhere classified: Secondary | ICD-10-CM | POA: Diagnosis not present

## 2015-02-03 HISTORY — DX: Benign prostatic hyperplasia with lower urinary tract symptoms: N40.1

## 2015-02-03 HISTORY — DX: Nocturia: R35.1

## 2015-02-03 HISTORY — DX: Unspecified malignant neoplasm of skin, unspecified: C44.90

## 2015-02-03 LAB — CBC WITH DIFFERENTIAL/PLATELET
Basophils Absolute: 0 10*3/uL (ref 0.0–0.1)
Basophils Relative: 0 % (ref 0–1)
Eosinophils Absolute: 0.1 10*3/uL (ref 0.0–0.7)
Eosinophils Relative: 2 % (ref 0–5)
HCT: 37.6 % — ABNORMAL LOW (ref 39.0–52.0)
Hemoglobin: 12.4 g/dL — ABNORMAL LOW (ref 13.0–17.0)
LYMPHS ABS: 1.3 10*3/uL (ref 0.7–4.0)
Lymphocytes Relative: 20 % (ref 12–46)
MCH: 30 pg (ref 26.0–34.0)
MCHC: 33 g/dL (ref 30.0–36.0)
MCV: 91 fL (ref 78.0–100.0)
MONO ABS: 0.4 10*3/uL (ref 0.1–1.0)
Monocytes Relative: 6 % (ref 3–12)
NEUTROS ABS: 4.6 10*3/uL (ref 1.7–7.7)
NEUTROS PCT: 72 % (ref 43–77)
Platelets: 130 10*3/uL — ABNORMAL LOW (ref 150–400)
RBC: 4.13 MIL/uL — AB (ref 4.22–5.81)
RDW: 12.8 % (ref 11.5–15.5)
WBC: 6.4 10*3/uL (ref 4.0–10.5)

## 2015-02-03 LAB — COMPREHENSIVE METABOLIC PANEL
ALT: 16 U/L — ABNORMAL LOW (ref 17–63)
ANION GAP: 8 (ref 5–15)
AST: 22 U/L (ref 15–41)
Albumin: 3 g/dL — ABNORMAL LOW (ref 3.5–5.0)
Alkaline Phosphatase: 59 U/L (ref 38–126)
BUN: 13 mg/dL (ref 6–20)
CO2: 26 mmol/L (ref 22–32)
CREATININE: 0.87 mg/dL (ref 0.61–1.24)
Calcium: 8.6 mg/dL — ABNORMAL LOW (ref 8.9–10.3)
Chloride: 105 mmol/L (ref 101–111)
GFR calc non Af Amer: >60 mL/min (ref >60)
GLUCOSE: 90 mg/dL (ref 65–99)
Potassium: 3.8 mmol/L (ref 3.5–5.1)
Sodium: 139 mmol/L (ref 135–145)
Total Bilirubin: 0.6 mg/dL (ref 0.3–1.2)
Total Protein: 5.8 g/dL — ABNORMAL LOW (ref 6.5–8.1)

## 2015-02-03 LAB — I-STAT CREATININE, ED: CREATININE: 0.9 mg/dL (ref 0.61–1.24)

## 2015-02-03 MED ORDER — SODIUM CHLORIDE 0.9 % IV SOLN: 250.0000 mL | INTRAVENOUS | Status: DC | PRN

## 2015-02-03 MED ORDER — PSYLLIUM 95 % PO PACK
1.0000 | PACK | Freq: Every day | ORAL | Status: DC | PRN
  Filled 2015-02-03: qty 1

## 2015-02-03 MED ORDER — MORPHINE SULFATE 4 MG/ML IJ SOLN
4.0000 mg | INTRAMUSCULAR | Status: DC | PRN
  Administered 2015-02-03: 4 mg via INTRAVENOUS
  Filled 2015-02-03: qty 1

## 2015-02-03 MED ORDER — ACETAMINOPHEN 325 MG PO TABS: 650.0000 mg | ORAL_TABLET | Freq: Four times a day (QID) | ORAL | Status: DC | PRN

## 2015-02-03 MED ORDER — TRAZODONE HCL 150 MG PO TABS
150.0000 mg | ORAL_TABLET | Freq: Every day | ORAL | Status: DC
  Administered 2015-02-04 – 2015-02-07 (×4): 150 mg via ORAL
  Filled 2015-02-03 (×6): qty 1

## 2015-02-03 MED ORDER — ENSURE ENLIVE PO LIQD
237.0000 mL | Freq: Two times a day (BID) | ORAL | Status: DC
  Administered 2015-02-05 – 2015-02-08 (×5): 237 mL via ORAL

## 2015-02-03 MED ORDER — OMEGA-3 FATTY ACIDS 1000 MG PO CAPS
2.0000 g | ORAL_CAPSULE | Freq: Every day | ORAL | Status: DC
Start: 2015-02-03 — End: 2015-02-03

## 2015-02-03 MED ORDER — SODIUM CHLORIDE 0.9 % IJ SOLN
3.0000 mL | INTRAMUSCULAR | Status: DC | PRN
  Administered 2015-02-03: 3 mL via INTRAVENOUS
  Filled 2015-02-03: qty 3

## 2015-02-03 MED ORDER — HYDROMORPHONE HCL 1 MG/ML IJ SOLN: 1.0000 mg | INTRAMUSCULAR | Status: DC | PRN

## 2015-02-03 MED ORDER — PANTOPRAZOLE SODIUM 40 MG PO TBEC
40.0000 mg | DELAYED_RELEASE_TABLET | Freq: Every day | ORAL | Status: DC
  Administered 2015-02-04 – 2015-02-08 (×5): 40 mg via ORAL
  Filled 2015-02-03 (×6): qty 1

## 2015-02-03 MED ORDER — ALUM & MAG HYDROXIDE-SIMETH 200-200-20 MG/5ML PO SUSP: 30.0000 mL | Freq: Four times a day (QID) | ORAL | Status: DC | PRN

## 2015-02-03 MED ORDER — KETOROLAC TROMETHAMINE 30 MG/ML IJ SOLN
15.0000 mg | Freq: Once | INTRAMUSCULAR | Status: AC
  Administered 2015-02-03: 15 mg via INTRAVENOUS
  Filled 2015-02-03: qty 1

## 2015-02-03 MED ORDER — OXYCODONE-ACETAMINOPHEN 5-325 MG PO TABS
1.0000 | ORAL_TABLET | ORAL | Status: DC | PRN
  Administered 2015-02-03 – 2015-02-08 (×4): 2 via ORAL
  Administered 2015-02-08: 1 via ORAL
  Filled 2015-02-03: qty 2
  Filled 2015-02-03: qty 1
  Filled 2015-02-03 (×3): qty 2

## 2015-02-03 MED ORDER — FENTANYL CITRATE (PF) 100 MCG/2ML IJ SOLN
50.0000 ug | Freq: Once | INTRAMUSCULAR | Status: AC
  Administered 2015-02-03: 50 ug via INTRAVENOUS
  Filled 2015-02-03: qty 2

## 2015-02-03 MED ORDER — KETOROLAC TROMETHAMINE 15 MG/ML IJ SOLN
15.0000 mg | Freq: Four times a day (QID) | INTRAMUSCULAR | Status: DC | PRN
  Administered 2015-02-04: 15 mg via INTRAVENOUS
  Filled 2015-02-03: qty 1

## 2015-02-03 MED ORDER — DEXAMETHASONE SODIUM PHOSPHATE 4 MG/ML IJ SOLN
4.0000 mg | Freq: Four times a day (QID) | INTRAMUSCULAR | Status: DC
  Administered 2015-02-03 – 2015-02-07 (×16): 4 mg via INTRAVENOUS
  Filled 2015-02-03 (×19): qty 1

## 2015-02-03 MED ORDER — OXYMETAZOLINE HCL 0.05 % NA SOLN
1.0000 | Freq: Two times a day (BID) | NASAL | Status: DC | PRN
  Filled 2015-02-03: qty 15

## 2015-02-03 MED ORDER — ONDANSETRON HCL 4 MG PO TABS: 4.0000 mg | ORAL_TABLET | Freq: Four times a day (QID) | ORAL | Status: DC | PRN

## 2015-02-03 MED ORDER — ADULT MULTIVITAMIN W/MINERALS CH
1.0000 | ORAL_TABLET | Freq: Every day | ORAL | Status: DC
  Administered 2015-02-03 – 2015-02-07 (×5): 1 via ORAL
  Filled 2015-02-03 (×6): qty 1

## 2015-02-03 MED ORDER — DEXAMETHASONE SODIUM PHOSPHATE 4 MG/ML IJ SOLN
8.0000 mg | Freq: Once | INTRAMUSCULAR | Status: AC
  Administered 2015-02-03: 8 mg via INTRAVENOUS
  Filled 2015-02-03: qty 2

## 2015-02-03 MED ORDER — ONDANSETRON HCL 4 MG/2ML IJ SOLN
4.0000 mg | Freq: Once | INTRAMUSCULAR | Status: AC
  Administered 2015-02-03: 4 mg via INTRAVENOUS
  Filled 2015-02-03: qty 2

## 2015-02-03 MED ORDER — POLYETHYLENE GLYCOL 3350 17 G PO PACK: 17.0000 g | PACK | Freq: Every day | ORAL | Status: DC | PRN

## 2015-02-03 MED ORDER — ASPIRIN EC 81 MG PO TBEC
81.0000 mg | DELAYED_RELEASE_TABLET | Freq: Every day | ORAL | Status: DC
  Administered 2015-02-03 – 2015-02-07 (×5): 81 mg via ORAL
  Filled 2015-02-03 (×6): qty 1

## 2015-02-03 MED ORDER — FLUTICASONE PROPIONATE 50 MCG/ACT NA SUSP
1.0000 | Freq: Every evening | NASAL | Status: DC
  Administered 2015-02-03 – 2015-02-05 (×3): 1 via NASAL
  Filled 2015-02-03: qty 16

## 2015-02-03 MED ORDER — ENOXAPARIN SODIUM 40 MG/0.4ML ~~LOC~~ SOLN
40.0000 mg | SUBCUTANEOUS | Status: DC
  Administered 2015-02-03 – 2015-02-07 (×5): 40 mg via SUBCUTANEOUS
  Filled 2015-02-03 (×6): qty 0.4

## 2015-02-03 MED ORDER — GADOBENATE DIMEGLUMINE 529 MG/ML IV SOLN
20.0000 mL | Freq: Once | INTRAVENOUS | Status: AC | PRN
  Administered 2015-02-03: 16 mL via INTRAVENOUS

## 2015-02-03 MED ORDER — BISACODYL 5 MG PO TBEC: 5.0000 mg | DELAYED_RELEASE_TABLET | Freq: Every day | ORAL | Status: DC | PRN

## 2015-02-03 MED ORDER — OXYCODONE HCL ER 15 MG PO T12A
15.0000 mg | EXTENDED_RELEASE_TABLET | Freq: Two times a day (BID) | ORAL | Status: DC
  Administered 2015-02-03 – 2015-02-08 (×10): 15 mg via ORAL
  Filled 2015-02-03 (×10): qty 1

## 2015-02-03 MED ORDER — ASPIRIN 81 MG PO CHEW: 162.0000 mg | CHEWABLE_TABLET | Freq: Once | ORAL | Status: DC

## 2015-02-03 MED ORDER — NITROGLYCERIN 0.4 MG SL SUBL
0.4000 mg | SUBLINGUAL_TABLET | SUBLINGUAL | Status: DC | PRN
Start: 2015-02-03 — End: 2015-02-03

## 2015-02-03 MED ORDER — OMEGA-3-ACID ETHYL ESTERS 1 G PO CAPS
1.0000 g | ORAL_CAPSULE | Freq: Two times a day (BID) | ORAL | Status: DC
Start: 2015-02-03 — End: 2015-02-08
  Administered 2015-02-03 – 2015-02-07 (×9): 1 g via ORAL
  Filled 2015-02-03 (×11): qty 1

## 2015-02-03 MED ORDER — ACETAMINOPHEN 650 MG RE SUPP: 650.0000 mg | Freq: Four times a day (QID) | RECTAL | Status: DC | PRN

## 2015-02-03 MED ORDER — ONE-DAILY MULTI VITAMINS PO TABS: 1.0000 | ORAL_TABLET | Freq: Every day | ORAL | Status: DC

## 2015-02-03 MED ORDER — ONDANSETRON HCL 4 MG/2ML IJ SOLN: 4.0000 mg | Freq: Three times a day (TID) | INTRAMUSCULAR | Status: DC | PRN

## 2015-02-03 MED ORDER — ONDANSETRON HCL 4 MG/2ML IJ SOLN: 4.0000 mg | Freq: Four times a day (QID) | INTRAMUSCULAR | Status: DC | PRN

## 2015-02-03 MED ORDER — SODIUM CHLORIDE 0.9 % IJ SOLN
3.0000 mL | Freq: Two times a day (BID) | INTRAMUSCULAR | Status: DC
  Administered 2015-02-04 – 2015-02-08 (×8): 3 mL via INTRAVENOUS

## 2015-02-03 NOTE — ED Notes (Addendum)
Report to Teresa

## 2015-02-03 NOTE — ED Notes (Signed)
Patient arrived via EMS, he is from home.  Right leg pain that shoots to calf and toes.  Patient states that he is numb in toes for years and it getting worse.  No falls or injuries.

## 2015-02-03 NOTE — ED Notes (Signed)
MD at bedside. 

## 2015-02-03 NOTE — ED Notes (Signed)
Patient is not on O2, he is 97% on room air.

## 2015-02-03 NOTE — ED Notes (Signed)
Patient transported to MRI 

## 2015-02-03 NOTE — ED Notes (Signed)
Pt still in [place]

## 2015-02-03 NOTE — ED Notes (Signed)
Patient is still in [place]... Has been there fore an hour and half.

## 2015-02-03 NOTE — ED Notes (Signed)
Family is in the room at this time.  Patient is in [place].  Offered family a beverage.

## 2015-02-03 NOTE — H&P (Signed)
History and Physical:    Perry Nielsen   JKD:[id number] DOB: Sep 12, 1940 DOA: 02/03/2015  Referring MD/provider: Dr. Audie Pinto PCP: Joycelyn Man, MD   Chief Complaint: Back pain x 1 month  History of Present Illness:   Perry Nielsen is an 74 y.o. male with a PMH of benign prostatic hyperplasia status post TURP who presents with a 2 month history of right hip pain and central back pain who presents to the hospital with worsening back pain.  He also reports right rib pain.  The patient was initially seen in the ED on 01/15/15, and was not noted to have any neurological deficits at that time. His pain improved with IV fentanyl, and he was discharged home with recommendations to follow-up with his orthopedic doctor, Dr. Rolena Infante. He thinks he has lost 12-14 pounds over the past few months, which he attributes to loss of appetite from pain.  He reports from fatigue and weakness over the past few weeks, normally he is very active with golf/swimming. The patient had upper thigh pain over the past 24 hours, which radiated to the lower leg, was rated 9/10 at worst.  He found an old prescription of hydrocodone, and took some of it which eased the pain off a bit.  Movement exacerbates the pain, and lying still eases it off. He also has noticed the sudden onset of a right sided foot drop with inability to ambulate. An MRI of his lumbar spine was done on admission, and it shows L4-L5 stenosis affecting the right L4 nerve. There is also bony metastatic disease.  ROS:   Review of Systems  Constitutional: Positive for weight loss, malaise/fatigue and diaphoresis. Negative for fever and chills.  HENT: Positive for congestion.   Eyes: Negative.   Respiratory: Negative for cough, sputum production and shortness of breath.   Cardiovascular: Negative for chest pain, palpitations, orthopnea, leg swelling and PND.  Gastrointestinal: Positive for diarrhea. Negative for heartburn, nausea, vomiting,  abdominal pain, constipation, blood in stool and melena.  Genitourinary: Positive for frequency. Negative for dysuria, urgency, hematuria and flank pain.  Musculoskeletal: Positive for myalgias, back pain and joint pain. Negative for falls and neck pain.  Skin: Negative.   Neurological: Positive for tingling, sensory change, focal weakness and weakness. Negative for dizziness, seizures and loss of consciousness.  Endo/Heme/Allergies: Positive for environmental allergies. Negative for polydipsia. Bruises/bleeds easily.  Psychiatric/Behavioral: Negative.   Travel history: No recent travel.   Past Medical History:   Past Medical History  Diagnosis Date  . 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 ALLERGIC RHINITIS 08/03/2007  . GERD 02/18/2007  . BENIGN PROSTATIC HYPERTROPHY 02/18/2007  . KNEE PAIN, LEFT, CHRONIC 08/07/2008  . BACK PAIN 09/18/2009  . HAMMER TOE 08/03/2007  . 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 CAROTID BRUIT, LEFT 08/07/2008  . Abdominal pain, right upper quadrant 09/18/2009  . Skin cancer   . Plantar fasciitis of right foot 03/24/2012  . BPH associated with nocturia 02/18/2007    Qualifier: Diagnosis of  By: Tiney Rouge CMA, Brent.[place]      Past Surgical History:   Past Surgical History  Procedure Laterality Date  . Shoulder open rotator cuff repair  07/06/05    right  . Knee arthroscopy      left  . Tonsillectomy and adenoidectomy    . Transurethral resection of prostate    . Cataract extraction w/ intraocular lens  implant, bilateral      Social History:   History  Social History  . Marital Status: Married    Spouse Name: N/A  . Number of Children: N/A  . Years of Education: N/A   Occupational History  . Not on file.   Social History Main Topics  . Smoking status: Never Smoker   . Smokeless tobacco: Not on file  . Alcohol Use: 0.0 oz/week    0 Standard drinks or equivalent per week     Comment: 2-3 times a week. Beer.   . Drug Use: No  .  Sexual Activity: Not on file   Other Topics Concern  . Not on file   Social History Narrative   Married.  Very active.      Family history:   Family History  Problem Relation Age of Onset  . Heart attack Other   . Aneurysm Other     aortic  . Coronary artery disease Sister   . Cancer Father     prostate  . Pulmonary fibrosis Mother   . Uterine cancer Mother     Allergies   Antihistamines, diphenhydramine-type  Current Medications:   Prior to Admission medications   Medication Sig Start Date End Date Taking? Authorizing Provider  aspirin EC 81 MG tablet Take 81 mg by mouth daily.   Yes Historical Provider, MD  fish oil-omega-3 fatty acids 1000 MG capsule Take 2 g by mouth daily.     Yes Historical Provider, MD  fluticasone (FLONASE) 50 MCG/ACT nasal spray Place 1 spray into both nostrils every evening.   Yes Historical Provider, MD  glucosamine-chondroitin 500-400 MG tablet Take 2 tablets by mouth daily.    Yes Historical Provider, MD  HYDROcodone-acetaminophen (NORCO/VICODIN) 5-325 MG per tablet Take 1 tablet by mouth every 6 (six) hours as needed for moderate pain. 01/11/15  Yes Marletta Lor, MD  ibuprofen (ADVIL,MOTRIN) 200 MG tablet Take 400-800 mg by mouth every 6 (six) hours as needed (for pain.).   Yes Historical Provider, MD  Multiple Vitamin (MULTIVITAMIN) tablet Take 1 tablet by mouth daily.     Yes Historical Provider, MD  oxymetazoline (AFRIN) 0.05 % nasal spray Place 1 spray into both nostrils 2 (two) times daily as needed for congestion.   Yes Historical Provider, MD  psyllium (HYDROCIL/METAMUCIL) 95 % PACK Take 1 packet by mouth daily as needed for mild constipation or moderate constipation.    Yes Historical Provider, MD  sildenafil (VIAGRA) 100 MG tablet Take 0.5-1 tablets (50-100 mg total) by mouth daily as needed for erectile dysfunction. 10/23/14  Yes Dorena Cookey, MD  tetrahydrozoline 0.05 % ophthalmic solution Place 1-2 drops into both eyes 4 (four)  times daily as needed (for eye irritation).   Yes Historical Provider, MD  traMADol (ULTRAM) 50 MG tablet Take 1 tablet by mouth 2 (two) times daily as needed for moderate pain.  01/24/15  Yes Historical Provider, MD  traZODone (DESYREL) 150 MG tablet Take 1 tablet (150 mg total) by mouth at bedtime. 10/23/14  Yes Dorena Cookey, MD    Physical Exam:   Filed Vitals:   02/03/15 1138 02/03/15 1208 02/03/15 1250 02/03/15 1536  BP: 121/66  116/72 110/65  Pulse: 50  51 52  Temp: 98.1 F (36.7 C)  98.1 F (36.7 C)   TempSrc: Oral  Oral   Resp: 18  20 18   SpO2: 98% 8% 97% 96%     Physical Exam: Blood pressure 110/65, pulse 52, temperature 98.1 F (36.7 C), temperature source Oral, resp. rate 18, SpO2 96 %. Gen:  No acute distress. Head: Normocephalic, atraumatic. Eyes: PERRL, EOMI, sclerae nonicteric. Mouth: Oropharynx clear. Tongue is midline.  Neck: Supple, no thyromegaly, no lymphadenopathy, no jugular venous distention. Chest: Lungs are clear to auscultation bilaterally.  CV: Heart sounds are regular, with no murmurs, rubs, or gallops.  Abdomen: Soft, nontender, nondistended with normal active bowel sounds. Extremities: Extremities are without clubbing, edema, or cyanosis.  Skin: Warm and dry. Neuro: Alert and oriented times 3; cranial nerves II through XII grossly intact. Right foot drop and right foot weakness . Psych: Mood and affect normal.   Data Review:    Labs: Basic Metabolic Panel:  Recent Labs Lab 02/03/15 1445 02/03/15 1540  NA  --  139  K  --  3.8  CL  --  105  CO2  --  26  GLUCOSE  --  90  BUN  --  13  CREATININE 0.90 0.87  CALCIUM  --  8.6*   Liver Function Tests:  Recent Labs Lab 02/03/15 1540  AST 22  ALT 16*  ALKPHOS 59  BILITOT 0.6  PROT 5.8*  ALBUMIN 3.0*   CBC:  Recent Labs Lab 02/03/15 1540  WBC 6.4  NEUTROABS 4.6  HGB 12.4*  HCT 37.6*  MCV 91.0  PLT 130*    Radiographic Studies: Dg Chest 2 View  02/03/2015   CLINICAL  DATA:  Chest and back pain for 2 weeks. Prostate carcinoma.  EXAM: CHEST  2 VIEW  COMPARISON:  Thoracic spine radiographs on 01/15/2015  FINDINGS: The heart size and mediastinal contours are within normal limits. Both lungs are clear. No evidence of pneumothorax or pleural effusion.  Mild thoracic spine degenerative changes again noted. A moderate wedge compression fracture of a mid thoracic vertebral body is seen which appears new since recent exam. No focal sclerotic or lytic bone lesions visualized.  IMPRESSION: New moderate wedge compression fracture of mid thoracic vertebral body.  No active cardiopulmonary disease.   Electronically Signed   By: Earle Gell M.D.   On: 02/03/2015 16:27   Mr Lumbar Spine Wo Contrast  02/03/2015   CLINICAL DATA:  Foot drop. RIGHT leg pain extending to calf and toes. Personal history of prostate cancer. Chronic back pain with rapid worsening of symptoms recently.  EXAM: MRI LUMBAR SPINE WITHOUT CONTRAST  TECHNIQUE: Multiplanar, multisequence MR imaging of the lumbar spine was performed. No intravenous contrast was administered.  COMPARISON:  None.  FINDINGS: Segmentation: The numbering convention used for this exam termed L5-S1 as the last intervertebral disc space.  Alignment: Mild levoconvex curve of the lumbar spine. No spondylolisthesis.  Vertebrae: Bone marrow signal shows suppression of normal fatty marrow. This is a nonspecific finding most commonly associated with obesity, anemia, cigarette smoking or chronic disease.  Metastatic disease of the lumbar spine is also present. There is a large vertebral body metastasis in the anterior L5 vertebra without pathologic compression fracture. The other lumbar vertebrae show mild degenerative changes without bony metastatic disease.  There is also metastatic disease to the sacrum and iliac bones bilaterally. Additional coronal images of the sacrum and SI joints were performed which demonstrate metastatic disease. Although only  partially visible, some portions of the metastatic disease in the sacrum enhance however there is a large necrotic region in the medial RIGHT sacral ala. There is encroachment on the RIGHT S1 nerve in the neural foramen by the expanded RIGHT sacral ala.  Conus medullaris: Normal termination dorsal to L2. No intrathecal or dural metastases are present.  Paraspinal tissues:  Bilateral renal cysts appear simple. Aortic ectasia and atherosclerosis. Maximal diameter the infrarenal abdominal aorta is 26 mm. Bilateral iliac artery ectasia is also present, with the LEFT measuring 16 mm.  Disc levels:  Disc Signal: Lower lumbar disc desiccation.  T12-L1:  Negative.  L1-L2: Mild disc degeneration but no stenosis.  L2-L3:  Negative.  L3-L4:  Disc desiccation but no stenosis.  L4-L5: Disc desiccation. Bilateral facet arthritis with mild periarticular edema and enhancement. Mild central stenosis associated with facet hypertrophy and ligamentum flavum redundancy. Mild RIGHT foraminal stenosis associated with anterior facet spurring. This potentially affects the exiting RIGHT L4 nerve. The LEFT neural foramen appears adequately patent.  L5-S1: Disc desiccation and degeneration. Central canal and subarticular zones patent. Severe RIGHT facet arthrosis is present. Very mild RIGHT foraminal stenosis associated with endplate spurring and facet spurring.  IMPRESSION: 1. 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 RIGHT S1 foraminal narrowing associated with expansile metastasis of the RIGHT sacral ala. 2. Bilateral L4-L5 facet arthritis with RIGHT foraminal stenosis potentially affecting the RIGHT L4 nerve. 3. Mild L5-S1 RIGHT foraminal stenosis potentially affecting the exiting RIGHT L5 nerve.   Electronically Signed   By: Dereck Ligas M.D.   On: 02/03/2015 15:51     Assessment/Plan:   Principal Problem:   Spine  metastasis with foot drop - MRI shows findings consistent with metastatic disease of uncertain primary, but suspect prostate. - No history of prostate cancer. Has had a TURP for BPH. Father died of prostate cancer. - Check PSA. - IR consulted for CT biopsy. - Start Decadron. - Dr. Lisbeth Renshaw of Radiation Oncology consulted and will see the patient tomorrow. - PT evaluation.   Active Problems:   Allergic rhinitis - Continue Flonase and Afrin as needed.    GERD - Place on PPI given treatment with steroids.     BPH associated with nocturia - Check PSA.     Backache  - We'll start on OxyContin and oxycodone as needed for pain. Morphine for breakthrough pain. - Add Toradol PRN. - PT evaluation    DVT prophylaxis - Lovenox ordered.  Code Status: Full. Family Communication: Wife updated at the bedside.  Disposition Plan: Home when stable, likely several days.  Time spent: 1 hour.  RAMA,CHRISTINA [facility] Pager [phone] Cell: [phone]   If 7PM-7AM, please contact night-coverage [email or web address] Password [facility] 02/03/2015, 5:34 PM

## 2015-02-03 NOTE — ED Notes (Signed)
Bed: WA01 Expected date:  Expected time:  Means of arrival:  Comments: EMS- sciatica

## 2015-02-03 NOTE — ED Notes (Addendum)
Was notified by NT in MRI with pt that he is c/o severe pain and requesting pain medications.  Made Dr Audie Pinto aware.

## 2015-02-03 NOTE — ED Provider Notes (Signed)
CSN: [id number]     Arrival date & time 02/03/15  1129 History   First MD Initiated Contact with Patient 02/03/15 1141     No chief complaint on file.    HPI Patient presents with low back pain sciatica on the right which got suddenly worse last night when he flexed his leg.  He been unable to walk since that time.  He has subjective numbness to the right foot and leg.  Has been seen and evaluated by orthopedics and medicine. Past Medical History  Diagnosis Date  . 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 ALLERGIC RHINITIS 08/03/2007  . GERD 02/18/2007  . BENIGN PROSTATIC HYPERTROPHY 02/18/2007  . KNEE PAIN, LEFT, CHRONIC 08/07/2008  . BACK PAIN 09/18/2009  . HAMMER TOE 08/03/2007  . 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 CAROTID BRUIT, LEFT 08/07/2008  . Abdominal pain, right upper quadrant 09/18/2009   Past Surgical History  Procedure Laterality Date  . Shoulder open rotator cuff repair  07/06/05    right  . Knee arthroscopy      left  . Tonsillectomy and adenoidectomy    . Prostatectomy     Family History  Problem Relation Age of Onset  . Heart attack Other   . Aneurysm Other     aortic  . Coronary artery disease Other   . Cancer Other     prostate   History  Substance Use Topics  . Smoking status: Never Smoker   . Smokeless tobacco: Not on file  . Alcohol Use: Yes     Comment: 2-3 times a week. Beer     Review of Systems  All other systems reviewed and are negative  Allergies  Antihistamines, diphenhydramine-type  Home Medications   Prior to Admission medications   Medication Sig Start Date End Date Taking? Authorizing Provider  aspirin EC 81 MG tablet Take 81 mg by mouth daily.   Yes Historical Provider, MD  fish oil-omega-3 fatty acids 1000 MG capsule Take 2 g by mouth daily.     Yes Historical Provider, MD  fluticasone (FLONASE) 50 MCG/ACT nasal spray Place 1 spray into both nostrils every evening.   Yes Historical Provider, MD   glucosamine-chondroitin 500-400 MG tablet Take 2 tablets by mouth daily.    Yes Historical Provider, MD  HYDROcodone-acetaminophen (NORCO/VICODIN) 5-325 MG per tablet Take 1 tablet by mouth every 6 (six) hours as needed for moderate pain. 01/11/15  Yes Marletta Lor, MD  ibuprofen (ADVIL,MOTRIN) 200 MG tablet Take 400-800 mg by mouth every 6 (six) hours as needed (for pain.).   Yes Historical Provider, MD  Multiple Vitamin (MULTIVITAMIN) tablet Take 1 tablet by mouth daily.     Yes Historical Provider, MD  oxymetazoline (AFRIN) 0.05 % nasal spray Place 1 spray into both nostrils 2 (two) times daily as needed for congestion.   Yes Historical Provider, MD  psyllium (HYDROCIL/METAMUCIL) 95 % PACK Take 1 packet by mouth daily as needed for mild constipation or moderate constipation.    Yes Historical Provider, MD  sildenafil (VIAGRA) 100 MG tablet Take 0.5-1 tablets (50-100 mg total) by mouth daily as needed for erectile dysfunction. 10/23/14  Yes Dorena Cookey, MD  tetrahydrozoline 0.05 % ophthalmic solution Place 1-2 drops into both eyes 4 (four) times daily as needed (for eye irritation).   Yes Historical Provider, MD  traMADol (ULTRAM) 50 MG tablet Take 1 tablet by mouth 2 (two) times daily as needed for moderate pain.  01/24/15  Yes  Historical Provider, MD  traZODone (DESYREL) 150 MG tablet Take 1 tablet (150 mg total) by mouth at bedtime. 10/23/14  Yes Dorena Cookey, MD   BP 110/65 mmHg  Pulse 52  Temp(Src) 98.1 F (36.7 C) (Oral)  Resp 18  SpO2 96% Physical Exam Physical Exam  Nursing note and vitals reviewed. Constitutional: He is oriented to person, place, and time. He appears well-developed and well-nourished. No distress.  HENT:  Head: Normocephalic and atraumatic.  Eyes: Pupils are equal, round, and reactive to light.  Neck: Normal range of motion.  Cardiovascular: Normal rate and intact distal pulses.   Pulmonary/Chest: No respiratory distress.  Abdominal: Normal appearance.  He exhibits no distension.  Musculoskeletal: Normal range of motion.  Neurological: He is alert and oriented to person, place, and time. No cranial nerve deficit.  Patient with significant low lumbar pain on the right.  Has noted foot drop on the right.  Straight leg raise positive on the right.  Subjective numbness to right foot.   Skin: Skin is warm and dry. No rash noted.  Psychiatric: He has a normal mood and affect. His behavior is normal.   ED Course  Procedures (including critical care time) Labs Review Labs Reviewed  CBC WITH DIFFERENTIAL/PLATELET - Abnormal; Notable for the following:    RBC 4.13 (*)    Hemoglobin 12.4 (*)    HCT 37.6 (*)    Platelets 130 (*)    All other components within normal limits  COMPREHENSIVE METABOLIC PANEL  PSA, TOTAL AND FREE  COMPREHENSIVE METABOLIC PANEL  I-STAT CREATININE, ED    Imaging Review Mr Lumbar Spine Wo Contrast  02/03/2015   CLINICAL DATA:  Foot drop. RIGHT leg pain extending to calf and toes. Personal history of prostate cancer. Chronic back pain with rapid worsening of symptoms recently.  EXAM: MRI LUMBAR SPINE WITHOUT CONTRAST  TECHNIQUE: Multiplanar, multisequence MR imaging of the lumbar spine was performed. No intravenous contrast was administered.  COMPARISON:  None.  FINDINGS: Segmentation: The numbering convention used for this exam termed L5-S1 as the last intervertebral disc space.  Alignment: Mild levoconvex curve of the lumbar spine. No spondylolisthesis.  Vertebrae: Bone marrow signal shows suppression of normal fatty marrow. This is a nonspecific finding most commonly associated with obesity, anemia, cigarette smoking or chronic disease.  Metastatic disease of the lumbar spine is also present. There is a large vertebral body metastasis in the anterior L5 vertebra without pathologic compression fracture. The other lumbar vertebrae show mild degenerative changes without bony metastatic disease.  There is also metastatic disease  to the sacrum and iliac bones bilaterally. Additional coronal images of the sacrum and SI joints were performed which demonstrate metastatic disease. Although only partially visible, some portions of the metastatic disease in the sacrum enhance however there is a large necrotic region in the medial RIGHT sacral ala. There is encroachment on the RIGHT S1 nerve in the neural foramen by the expanded RIGHT sacral ala.  Conus medullaris: Normal termination dorsal to L2. No intrathecal or dural metastases are present.  Paraspinal tissues: Bilateral renal cysts appear simple. Aortic ectasia and atherosclerosis. Maximal diameter the infrarenal abdominal aorta is 26 mm. Bilateral iliac artery ectasia is also present, with the LEFT measuring 16 mm.  Disc levels:  Disc Signal: Lower lumbar disc desiccation.  T12-L1:  Negative.  L1-L2: Mild disc degeneration but no stenosis.  L2-L3:  Negative.  L3-L4:  Disc desiccation but no stenosis.  L4-L5: Disc desiccation. Bilateral facet arthritis with mild  periarticular edema and enhancement. Mild central stenosis associated with facet hypertrophy and ligamentum flavum redundancy. Mild RIGHT foraminal stenosis associated with anterior facet spurring. This potentially affects the exiting RIGHT L4 nerve. The LEFT neural foramen appears adequately patent.  L5-S1: Disc desiccation and degeneration. Central canal and subarticular zones patent. Severe RIGHT facet arthrosis is present. Very mild RIGHT foraminal stenosis associated with endplate spurring and facet spurring.    IMPRESSION: 1. 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 RIGHT S1 foraminal narrowing associated with expansile metastasis of the RIGHT sacral ala. 2. Bilateral L4-L5 facet arthritis with RIGHT foraminal stenosis potentially affecting the RIGHT L4 nerve. 3. Mild L5-S1 RIGHT foraminal stenosis potentially  affecting the exiting RIGHT L5 nerve.     Electronically Signed   By: Dereck Ligas M.D.   On: 02/03/2015 15:51    Discussed with neurosurgery who reviewed the MRI results.  No acute surgical intervention needed at this time.  Recommended admission to medicine for further workup.  MDM   Final diagnoses:  Foot drop  Metastatic cancer        Leonard Schwartz, MD 02/03/15 [phone]

## 2015-02-04 ENCOUNTER — Ambulatory Visit
Admit: 2015-02-04 | Discharge: 2015-02-04 | Disposition: A | Discharge status: Home / Self Care | Payer: Medicare Other | Source: Ambulatory Visit | Attending: Radiation Oncology | Admitting: Radiation Oncology

## 2015-02-04 ENCOUNTER — Inpatient Hospital Stay (HOSPITAL_COMMUNITY): Payer: Medicare Other

## 2015-02-04 ENCOUNTER — Encounter (HOSPITAL_COMMUNITY): Payer: Self-pay | Admitting: Radiology

## 2015-02-04 DIAGNOSIS — C801 Malignant (primary) neoplasm, unspecified: Secondary | ICD-10-CM | POA: Insufficient documentation

## 2015-02-04 DIAGNOSIS — C7951 Secondary malignant neoplasm of bone: Principal | ICD-10-CM

## 2015-02-04 DIAGNOSIS — M21371 Foot drop, right foot: Secondary | ICD-10-CM

## 2015-02-04 DIAGNOSIS — K219 Gastro-esophageal reflux disease without esophagitis: Secondary | ICD-10-CM

## 2015-02-04 LAB — PROTIME-INR
INR: 1.09 (ref 0.00–1.49)
Prothrombin Time: 14.3 seconds (ref 11.6–15.2)

## 2015-02-04 MED ORDER — FENTANYL CITRATE (PF) 100 MCG/2ML IJ SOLN
INTRAMUSCULAR | Status: AC | PRN
  Administered 2015-02-04: 50 ug via INTRAVENOUS

## 2015-02-04 MED ORDER — FENTANYL CITRATE (PF) 100 MCG/2ML IJ SOLN
INTRAMUSCULAR | Status: AC
  Filled 2015-02-04: qty 2

## 2015-02-04 MED ORDER — MIDAZOLAM HCL 2 MG/2ML IJ SOLN
INTRAMUSCULAR | Status: AC
  Filled 2015-02-04: qty 4

## 2015-02-04 MED ORDER — MIDAZOLAM HCL 2 MG/2ML IJ SOLN
INTRAMUSCULAR | Status: AC | PRN
  Administered 2015-02-04: 1 mg via INTRAVENOUS

## 2015-02-04 NOTE — Progress Notes (Signed)
PT Cancellation Note  Patient Details Name: Perry Nielsen MRN: [id number] DOB: February 27, 1941   Cancelled Treatment:    Reason Eval/Treat Not Completed: Medical issues which prohibited therapyPatient has profound Footdrop with risk of ankle injury , wife to bring shoes. Patient could benefit from Consult with [employer] for appropriate AFO.. A prefab  AFO would be risky with sensory loss present.. Patient scheduled for biopsy. Will  Check back  Later as schedule permits.   Claretha Cooper 02/04/2015, 1:18 PM Tresa Endo PT [phone]

## 2015-02-04 NOTE — Progress Notes (Signed)
Orthopedic Tech Progress Note Patient Details:  Perry Nielsen [birth year] [id number]  Ortho Devices Type of Ortho Device: Postop shoe/boot Ortho Device/Splint Location: prafo Ortho Device/Splint Interventions: Application   Cammer, Theodoro Parma 02/04/2015, 7:40 PM

## 2015-02-04 NOTE — Care Management Note (Signed)
Case Management Note  Patient Details  Name: Perry Nielsen MRN: [id number] Date of Birth: [birth year]  Subjective/Objective:                 Sudden onset of back pain and left foot drop   Action/Plan: Date:  February 04, 2015 U.R. performed for needs and level of care. Will continue to follow for Case Management needs.  Velva Harman, RN, BSN, [place]   [phone]  Expected Discharge Date:  02/05/15               Expected Discharge Plan:  Home/Self Care  In-House Referral:  NA  Discharge planning Services  CM Consult  Post Acute Care Choice:  NA Choice offered to:  NA  DME Arranged:  N/A DME Agency:  NA  HH Arranged:  NA HH Agency:  NA  Status of Service:  In process, will continue to follow  Medicare Important Message Given:    Date Medicare IM Given:    Medicare IM give by:    Date Additional Medicare IM Given:    Additional Medicare Important Message give by:     If discussed at [place] of Stay Meetings, dates discussed:    Additional Comments:  Leeroy Cha, RN 02/04/2015, 12:00 PM

## 2015-02-04 NOTE — Procedures (Signed)
Successful RT SACRAL MASS CORE BX NO COMP STABLE FULL REPORT IN PACS PATH PENDING

## 2015-02-04 NOTE — Progress Notes (Signed)
Initial Nutrition Assessment  DOCUMENTATION CODES:   Non-severe (moderate) malnutrition in context of chronic illness  INTERVENTION:  - Continue Ensure Enlive BID, each supplement provides 350 kcal and 20 grams of protein - Will order Magic cup once/day this supplement provides 290 kcal and 9 grams of protein - RD will continue to monitor for needs  NUTRITION DIAGNOSIS:   Inadequate oral intake related to poor appetite as evidenced by per patient/family report, meal completion < 50%.  GOAL:   Patient will meet greater than or equal to 90% of their needs  MONITOR:   PO intake, Supplement acceptance, Weight trends, Labs  REASON FOR ASSESSMENT:   Consult  (weight loss)  ASSESSMENT:  74 y.o. male with a PMH of benign prostatic hyperplasia status post TURP who presents with a 2 month history of right hip pain and central back pain who presents to the hospital with worsening back pain. He also reports right rib pain. The patient was initially seen in the ED on 01/15/15, and was not noted to have any neurological deficits at that time. His pain improved with IV Fentanyl.Marland Kitchen He thinks he has lost 12-14 pounds over the past few months, which he attributes to loss of appetite from pain. He reports from fatigue and weakness over the past few weeks, normally he is very active with golf/swimming.   Pt seen for consult related to weight loss. BMI indicates normal weight status. Pt states that he ate raisin bran and a small cup of fruit for breakfast this AM with no abdominal pain or nausea with intakes. He denies abdominal pain, nausea, or vomiting PTA but states poor appetite which began ~2 months ago when he started on a medication for pain control.   He states that he was still swimming and playing golf recently. For breakfast he would have 1/2 banana and a half handful of cereal. For lunch, he would go out to eat with friends the days he swam and ate well (he gives the example of a burger and  baked potato). His wife has not been cooking as much recently and would often pick up food from restaurants and pt reports variable intakes at this meal and sometimes he did not eat dinner at all.   He began drinking Ensure about a week ago and states that this has been working well for him although he would only drink 1/day. He also likes ice cream and ate this several times/week. Recommended that pt eat snacks when he feels hungry that have protein such as yogurt, nuts, or string cheese. Also encouraged him to make a shake out of Ensure by adding ice cream or yogurt and fruit to increase kcal, protein, and vitamins and minerals since he has been active.   Pt would like to try Magic Cup as a snack after dinner. Not meeting needs currently. Moderate muscle and mild fat wasting noted. Per weight hx review, pt has lost 8 lbs (5% body weight) in 3 weeks which is significant for time frame.  Medications reviewed. Labs reviewed; Ca: 8.6 mg/dL.   Diet Order:  Diet NPO time specified Except for: Sips with Meds  Skin:  Reviewed, no issues  Last BM:  PTA  Height:   Ht Readings from Last 1 Encounters:  02/03/15 6' (1.829 m)    Weight:   Wt Readings from Last 1 Encounters:  02/03/15 165 lb (74.844 kg)    Ideal Body Weight:  80.9 kg (kg)  BMI:  Body mass index is 22.37  kg/(m^2).  Estimated Nutritional Needs:   Kcal:  4235-3614  Protein:  75-85 grams  Fluid:  2.2 L/day  EDUCATION NEEDS:   No education needs identified at this time     Jarome Matin, RD, LDN Inpatient Clinical Dietitian Pager # [phone] After hours/weekend pager # [phone]

## 2015-02-04 NOTE — Progress Notes (Signed)
[facility] PROGRESS NOTE   Assessment/Plan: Spine metastasis/Backache/foot drop: - MRI as below, IR consulted for biopsy. - PSA pending, on decadron. - radio oncology consulted for possible radiation. - PT eval pending. - cont narcotics for pain.  Allergic rhinitis Cont flonase.  BPH associated with nocturia - PSA pending.  GERD - PPI.    Code Status: full Family Communication: wife  Disposition Plan: inpatient   Consultants:  Radiation oncology  IR  Procedures:  Biopsy   Antibiotics:  None  HPI/Subjective: Relates foot weakness is better.  Objective: Filed Vitals:   02/03/15 1810 02/03/15 1825 02/03/15 2148 02/04/15 0548  BP: 138/73  122/66 128/73  Pulse: 57  112 56  Temp: 98.1 F (36.7 C)  98.7 F (37.1 C) 98 F (36.7 C)  TempSrc: Oral  Oral Oral  Resp: 18  20 17   Height:  6' (1.829 m)    Weight:  74.844 kg (165 lb)    SpO2: 98%  96% 97%    Intake/Output Summary (Last 24 hours) at 02/04/15 1238 Last data filed at 02/04/15 0956  Gross per 24 hour  Intake    240 ml  Output      0 ml  Net    240 ml   Filed Weights   02/03/15 1825  Weight: 74.844 kg (165 lb)    Exam:  General: Alert, awake, oriented x3, in no acute distress.  HEENT: No bruits, no goiter.  Heart: Regular rate and rhythm. Lungs: Good air movement, clear Abdomen: Soft, nontender, nondistended, positive bowel sounds.  Neuro: foot drop.   Data Reviewed: Basic Metabolic Panel:  Recent Labs Lab 02/03/15 1445 02/03/15 1540  NA  --  139  K  --  3.8  CL  --  105  CO2  --  26  GLUCOSE  --  90  BUN  --  13  CREATININE 0.90 0.87  CALCIUM  --  8.6*   Liver Function Tests:  Recent Labs Lab 02/03/15 1540  AST 22  ALT 16*  ALKPHOS 59  BILITOT 0.6  PROT 5.8*  ALBUMIN 3.0*   No results for input(s): LIPASE, AMYLASE in the last 168 hours. No results for input(s): AMMONIA in the last 168 hours. CBC:  Recent Labs Lab 02/03/15 1540  WBC 6.4    NEUTROABS 4.6  HGB 12.4*  HCT 37.6*  MCV 91.0  PLT 130*   Cardiac Enzymes: No results for input(s): CKTOTAL, CKMB, CKMBINDEX, TROPONINI in the last 168 hours. BNP (last 3 results) No results for input(s): BNP in the last 8760 hours.  ProBNP (last 3 results) No results for input(s): PROBNP in the last 8760 hours.  CBG: No results for input(s): GLUCAP in the last 168 hours.  No results found for this or any previous visit (from the past 240 hour(s)).   Studies: Dg Chest 2 View  02/03/2015   CLINICAL DATA:  Chest and back pain for 2 weeks. Prostate carcinoma.  EXAM: CHEST  2 VIEW  COMPARISON:  Thoracic spine radiographs on 01/15/2015  FINDINGS: The heart size and mediastinal contours are within normal limits. Both lungs are clear. No evidence of pneumothorax or pleural effusion.  Mild thoracic spine degenerative changes again noted. A moderate wedge compression fracture of a mid thoracic vertebral body is seen which appears new since recent exam. No focal sclerotic or lytic bone lesions visualized.  IMPRESSION: New moderate wedge compression fracture of mid thoracic vertebral body.  No active cardiopulmonary disease.   Electronically Signed  By: Earle Gell M.D.   On: 02/03/2015 16:27   Mr Lumbar Spine Wo Contrast  02/03/2015   CLINICAL DATA:  Foot drop. RIGHT leg pain extending to calf and toes. Personal history of prostate cancer. Chronic back pain with rapid worsening of symptoms recently.  EXAM: MRI LUMBAR SPINE WITHOUT CONTRAST  TECHNIQUE: Multiplanar, multisequence MR imaging of the lumbar spine was performed. No intravenous contrast was administered.  COMPARISON:  None.  FINDINGS: Segmentation: The numbering convention used for this exam termed L5-S1 as the last intervertebral disc space.  Alignment: Mild levoconvex curve of the lumbar spine. No spondylolisthesis.  Vertebrae: Bone marrow signal shows suppression of normal fatty marrow. This is a nonspecific finding most commonly  associated with obesity, anemia, cigarette smoking or chronic disease.  Metastatic disease of the lumbar spine is also present. There is a large vertebral body metastasis in the anterior L5 vertebra without pathologic compression fracture. The other lumbar vertebrae show mild degenerative changes without bony metastatic disease.  There is also metastatic disease to the sacrum and iliac bones bilaterally. Additional coronal images of the sacrum and SI joints were performed which demonstrate metastatic disease. Although only partially visible, some portions of the metastatic disease in the sacrum enhance however there is a large necrotic region in the medial RIGHT sacral ala. There is encroachment on the RIGHT S1 nerve in the neural foramen by the expanded RIGHT sacral ala.  Conus medullaris: Normal termination dorsal to L2. No intrathecal or dural metastases are present.  Paraspinal tissues: Bilateral renal cysts appear simple. Aortic ectasia and atherosclerosis. Maximal diameter the infrarenal abdominal aorta is 26 mm. Bilateral iliac artery ectasia is also present, with the LEFT measuring 16 mm.  Disc levels:  Disc Signal: Lower lumbar disc desiccation.  T12-L1:  Negative.  L1-L2: Mild disc degeneration but no stenosis.  L2-L3:  Negative.  L3-L4:  Disc desiccation but no stenosis.  L4-L5: Disc desiccation. Bilateral facet arthritis with mild periarticular edema and enhancement. Mild central stenosis associated with facet hypertrophy and ligamentum flavum redundancy. Mild RIGHT foraminal stenosis associated with anterior facet spurring. This potentially affects the exiting RIGHT L4 nerve. The LEFT neural foramen appears adequately patent.  L5-S1: Disc desiccation and degeneration. Central canal and subarticular zones patent. Severe RIGHT facet arthrosis is present. Very mild RIGHT foraminal stenosis associated with endplate spurring and facet spurring.  IMPRESSION: 1. Bony metastatic disease to the L5 vertebra,  sacrum and both iliac bones, most compatible with metastatic prostate cancer in a patient with history of same. RIGHT S1 foraminal narrowing associated with expansile metastasis of the RIGHT sacral ala. 2. Bilateral L4-L5 facet arthritis with RIGHT foraminal stenosis potentially affecting the RIGHT L4 nerve. 3. Mild L5-S1 RIGHT foraminal stenosis potentially affecting the exiting RIGHT L5 nerve.   Electronically Signed   By: Dereck Ligas M.D.   On: 02/03/2015 15:51    Scheduled Meds: . aspirin EC  81 mg Oral Daily  . dexamethasone  4 mg Intravenous 4 times per day  . enoxaparin (LOVENOX) injection  40 mg Subcutaneous Q24H  . feeding supplement (ENSURE ENLIVE)  237 mL Oral BID BM  . fluticasone  1 spray Each Nare QPM  . multivitamin with minerals  1 tablet Oral Daily  . omega-3 acid ethyl esters  1 g Oral BID  . OxyCODONE  15 mg Oral Q12H  . pantoprazole  40 mg Oral Q0600  . sodium chloride  3 mL Intravenous Q12H  . traZODone  150 mg Oral QHS  Continuous Infusions:   Time Spent: 25 min   Charlynne Cousins  [facility] Pager [phone]. If 7PM-7AM, please contact night-coverage at [email or web address], password [facility] 02/04/2015, 12:38 PM  LOS: 1 day

## 2015-02-04 NOTE — Consult Note (Signed)
Chief Complaint: Patient was seen in consultation today for CT guided right sacral mass biopsy   Referring Physician(s): TRH  History of Present Illness: Perry Nielsen is a 74 y.o. male with approximately 2 month history of right hip and back pain and now with radiation of pain as well as paresthesias to right lower extremity with right foot drop. Patient has also experienced weight loss, weakness, intermittent right lateral chest discomfort and occasional night sweats. 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 There was right S1 foraminal narrowing associated with expansile metastasis of the right sacral ala. In addition there was bilateral L4-5 facet arthritis with right foraminal stenosis potentially affecting the right L4 nerve root and mild L5-S1 right foraminal stenosis potentially affecting the right L5 nerve. Chest x-ray did reveal a new moderate wedge compression fracture of a midthoracic vertebral body. Request has now been received for CT guided biopsy of a right sacral mass.  Past Medical History  Diagnosis Date  . 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 ALLERGIC RHINITIS 08/03/2007  . GERD 02/18/2007  . BENIGN PROSTATIC HYPERTROPHY 02/18/2007  . KNEE PAIN, LEFT, CHRONIC 08/07/2008  . BACK PAIN 09/18/2009  . HAMMER TOE 08/03/2007  . 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 CAROTID BRUIT, LEFT 08/07/2008  . Abdominal pain, right upper quadrant 09/18/2009  . Skin cancer   . Plantar fasciitis of right foot 03/24/2012  . BPH associated with nocturia 02/18/2007    Qualifier: Diagnosis of  By: Tiney Rouge CMA, Brent.Pancake      Past Surgical History  Procedure Laterality Date  . Shoulder open rotator cuff repair  07/06/05    right  . Knee arthroscopy      left  .  Tonsillectomy and adenoidectomy    . Transurethral resection of prostate    . Cataract extraction w/ intraocular lens  implant, bilateral      Allergies: Antihistamines, diphenhydramine-type  Medications: Prior to Admission medications   Medication Sig Start Date End Date Taking? Authorizing Provider  aspirin EC 81 MG tablet Take 81 mg by mouth daily.   Yes Historical Provider, MD  fish oil-omega-3 fatty acids 1000 MG capsule Take 2 g by mouth daily.     Yes Historical Provider, MD  fluticasone (FLONASE) 50 MCG/ACT nasal spray Place 1 spray into both nostrils every evening.   Yes Historical Provider, MD  glucosamine-chondroitin 500-400 MG tablet Take 2 tablets by mouth daily.    Yes Historical Provider, MD  HYDROcodone-acetaminophen (NORCO/VICODIN) 5-325 MG per tablet Take 1 tablet by mouth every 6 (six) hours as needed for moderate pain. 01/11/15  Yes Marletta Lor, MD  ibuprofen (ADVIL,MOTRIN) 200 MG tablet Take 400-800 mg by mouth every 6 (six) hours as needed (for pain.).   Yes Historical Provider, MD  Multiple Vitamin (MULTIVITAMIN) tablet Take 1 tablet by mouth daily.     Yes Historical Provider, MD  oxymetazoline (AFRIN) 0.05 % nasal spray Place 1 spray into both nostrils 2 (two) times daily as needed for congestion.   Yes Historical Provider, MD  psyllium (HYDROCIL/METAMUCIL) 95 % PACK Take 1 packet by mouth daily as needed for mild constipation or moderate constipation.    Yes Historical Provider, MD  sildenafil (VIAGRA) 100 MG tablet Take 0.5-1 tablets (50-100 mg total) by  mouth daily as needed for erectile dysfunction. 10/23/14  Yes Dorena Cookey, MD  tetrahydrozoline 0.05 % ophthalmic solution Place 1-2 drops into both eyes 4 (four) times daily as needed (for eye irritation).   Yes Historical Provider, MD  traMADol (ULTRAM) 50 MG tablet Take 1 tablet by mouth 2 (two) times daily as needed for moderate pain.  01/24/15  Yes Historical Provider, MD  traZODone (DESYREL) 150 MG  tablet Take 1 tablet (150 mg total) by mouth at bedtime. 10/23/14  Yes Dorena Cookey, MD     Family History  Problem Relation Age of Onset  . Heart attack Other   . Aneurysm Other     aortic  . Coronary artery disease Sister   . Cancer Father     prostate  . Pulmonary fibrosis Mother   . Uterine cancer Mother     History   Social History  . Marital Status: Married    Spouse Name: N/A  . Number of Children: N/A  . Years of Education: N/A   Social History Main Topics  . Smoking status: Never Smoker   . Smokeless tobacco: Not on file  . Alcohol Use: 0.0 oz/week    0 Standard drinks or equivalent per week     Comment: 2-3 times a week. Beer.   . Drug Use: No  . Sexual Activity: Not on file   Other Topics Concern  . None   Social History Narrative   Married.  Very active.      ECOG Status:   Review of Systems  see above  Vital Signs: BP 128/73 mmHg  Pulse 56  Temp(Src) 98 F (36.7 C) (Oral)  Resp 17  Ht 6' (1.829 m)  Wt 165 lb (74.844 kg)  BMI 22.37 kg/m2  SpO2 97%  Physical Exam patient awake, alert. Chest clear to auscultation bilaterally; heart with regular rate and rhythm abdomen soft, positive bowel sounds, nontender;Extremities-no significant edema, right foot drop with weakness , inability to adequately plantar and dorsiflex  Mallampati Score:     Imaging: Dg Chest 2 View  02/03/2015   CLINICAL DATA:  Chest and back pain for 2 weeks. Prostate carcinoma.  EXAM: CHEST  2 VIEW  COMPARISON:  Thoracic spine radiographs on 01/15/2015  FINDINGS: The heart size and mediastinal contours are within normal limits. Both lungs are clear. No evidence of pneumothorax or pleural effusion.  Mild thoracic spine degenerative changes again noted. A moderate wedge compression fracture of a mid thoracic vertebral body is seen which appears new since recent exam. No focal sclerotic or lytic bone lesions visualized.  IMPRESSION: New moderate wedge compression fracture of mid  thoracic vertebral body.  No active cardiopulmonary disease.   Electronically Signed   By: Earle Gell M.D.   On: 02/03/2015 16:27   Dg Thoracic Spine 2 View  01/15/2015   CLINICAL DATA:  No known injury. Increasing upper back pain for 72 hours.  EXAM: THORACIC SPINE - 2-3 VIEWS  COMPARISON:  None.  FINDINGS: Normal alignment of the thoracic spine. Diffuse degenerative change with narrowed thoracic interspaces and endplate hypertrophic changes. No vertebral compression deformities. No focal bone lesion or bone destruction. No paraspinal soft tissue swelling.  IMPRESSION: Degenerative changes in the thoracic spine. Normal alignment. No acute displaced fractures identified.   Electronically Signed   By: Lucienne Capers M.D.   On: 01/15/2015 04:33   Mr Lumbar Spine Wo Contrast  02/03/2015   CLINICAL DATA:  Foot drop. RIGHT leg pain extending  to calf and toes. Personal history of prostate cancer. Chronic back pain with rapid worsening of symptoms recently.  EXAM: MRI LUMBAR SPINE WITHOUT CONTRAST  TECHNIQUE: Multiplanar, multisequence MR imaging of the lumbar spine was performed. No intravenous contrast was administered.  COMPARISON:  None.  FINDINGS: Segmentation: The numbering convention used for this exam termed L5-S1 as the last intervertebral disc space.  Alignment: Mild levoconvex curve of the lumbar spine. No spondylolisthesis.  Vertebrae: Bone marrow signal shows suppression of normal fatty marrow. This is a nonspecific finding most commonly associated with obesity, anemia, cigarette smoking or chronic disease.  Metastatic disease of the lumbar spine is also present. There is a large vertebral body metastasis in the anterior L5 vertebra without pathologic compression fracture. The other lumbar vertebrae show mild degenerative changes without bony metastatic disease.  There is also metastatic disease to the sacrum and iliac bones bilaterally. Additional coronal images of the sacrum and SI joints were  performed which demonstrate metastatic disease. Although only partially visible, some portions of the metastatic disease in the sacrum enhance however there is a large necrotic region in the medial RIGHT sacral ala. There is encroachment on the RIGHT S1 nerve in the neural foramen by the expanded RIGHT sacral ala.  Conus medullaris: Normal termination dorsal to L2. No intrathecal or dural metastases are present.  Paraspinal tissues: Bilateral renal cysts appear simple. Aortic ectasia and atherosclerosis. Maximal diameter the infrarenal abdominal aorta is 26 mm. Bilateral iliac artery ectasia is also present, with the LEFT measuring 16 mm.  Disc levels:  Disc Signal: Lower lumbar disc desiccation.  T12-L1:  Negative.  L1-L2: Mild disc degeneration but no stenosis.  L2-L3:  Negative.  L3-L4:  Disc desiccation but no stenosis.  L4-L5: Disc desiccation. Bilateral facet arthritis with mild periarticular edema and enhancement. Mild central stenosis associated with facet hypertrophy and ligamentum flavum redundancy. Mild RIGHT foraminal stenosis associated with anterior facet spurring. This potentially affects the exiting RIGHT L4 nerve. The LEFT neural foramen appears adequately patent.  L5-S1: Disc desiccation and degeneration. Central canal and subarticular zones patent. Severe RIGHT facet arthrosis is present. Very mild RIGHT foraminal stenosis associated with endplate spurring and facet spurring.  IMPRESSION: 1. 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 RIGHT S1 foraminal narrowing associated with expansile metastasis of the RIGHT sacral ala. 2. Bilateral L4-L5 facet arthritis with RIGHT foraminal stenosis potentially affecting the RIGHT L4 nerve. 3. Mild L5-S1 RIGHT foraminal stenosis potentially affecting the exiting RIGHT L5 nerve.   Electronically Signed   By: Dereck Ligas M.D.   On: 02/03/2015  15:51    Labs:  CBC:  Recent Labs  10/23/14 1114 02/03/15 1540  WBC 6.4 6.4  HGB 15.8 12.4*  HCT 47.1 37.6*  PLT 137.0* 130*    COAGS:  Recent Labs  02/04/15 0835  INR 1.09    BMP:  Recent Labs  10/23/14 1114 02/03/15 1445 02/03/15 1540  NA 141  --  139  K 5.0  --  3.8  CL 107  --  105  CO2 31  --  26  GLUCOSE 87  --  90  BUN 16  --  13  CALCIUM 9.6  --  8.6*  CREATININE 1.07 0.90 0.87  GFRNONAA  --   --  >60  GFRAA  --   --  >60    LIVER FUNCTION TESTS:  Recent Labs  10/23/14 1114 02/03/15 1540  BILITOT 0.7 0.6  AST 19 22  ALT 16 16*  ALKPHOS 75 59  PROT 6.7 5.8*  ALBUMIN 4.2 3.0*    TUMOR MARKERS: No results for input(s): AFPTM, CEA, CA199, CHROMGRNA in the last 8760 hours.  Assessment and Plan: 74 year-old white male with history of acute on chronic back pain, right hip pain with radiation to right lower extremity, right foot drop, weakness, weight loss, night sweats, and imaging studies revealing bony metastatic disease to the L5 vertebra, sacrum and both iliac bones. Request now made for CT-guided biopsy of the right sacral mass. Imaging studies have been reviewed and details/risks of procedure, including but not limited to, internal bleeding, infection, injury to adjacent structures, discussed with patient with his understanding and consent. Procedure scheduled for later today.   Thank you for this interesting consult.  I greatly enjoyed meeting Perry Nielsen and look forward to participating in their care.  A copy of this report was sent to the requesting provider on this date.  Signed: D. Rowe Robert 02/04/2015, 11:51 AM   I spent a total of 20 minutes in face to face in clinical consultation, greater than 50% of which was counseling/coordinating care for CT-guided biopsy of right sacral mass.

## 2015-02-04 NOTE — Progress Notes (Signed)
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   HISTORY OF PRESENT ILLNESS::Perry Nielsen is a 74 y.o. male who is seen for an initial consultation visit regarding the patient's diagnosis of bony metastases. The patient presented with worsening back pain/right hip pain extending to calf/toes and foot drop. Patient has visited the ED on 7/12 and 7/31 due to 9/10 pain and was treated with pain killers with some alleviation. A 7/31 MRI of his lumbar spine revealed L4-L5 stenosis affecting the right L4 nerve and bony metastatic disease in L5 vertebra, sacrum and both iliac bones most compatible with metastatic prostate cancer.   Chest x-ray did reveal a new moderate wedge compression fracture of a midthoracic vertebral body. CT guided biopsy of a right sacral mass has been performed today (Dr. Annamaria Boots).   Patient has history of skin cancer (squamous cell carcinomas around head, no further difficulties) no melanomas, and a Mohs surgery.     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     PAST SURGICAL HISTORY: Past Surgical History  Procedure Laterality Date  . Shoulder open rotator cuff repair  07/06/05    right  . Knee  arthroscopy      left  . Tonsillectomy and adenoidectomy    . Transurethral resection of prostate    . Cataract extraction w/ intraocular lens  implant, bilateral       FAMILY HISTORY: family history includes Aneurysm in his other; Cancer in his father; Coronary artery disease in his sister; Heart attack in his other; Pulmonary fibrosis in his mother; Uterine cancer in his mother.   SOCIAL HISTORY:  reports that he has never smoked. He does not have any smokeless tobacco history on file. He reports that he drinks alcohol. He reports that he does not use illicit drugs.   ALLERGIES: Antihistamines, diphenhydramine-type   MEDICATIONS:  No current facility-administered medications for this encounter.   No current outpatient prescriptions on file.   Facility-Administered Medications Ordered in Other Encounters  Medication Dose Route Frequency Provider Last Rate Last Dose  . 0.9 %  sodium chloride infusion  250 mL Intravenous PRN Venetia Maxon Rama, MD      . acetaminophen (TYLENOL) tablet 650 mg  650 mg Oral Q6H PRN Venetia Maxon Rama, MD       Or  . acetaminophen (TYLENOL) suppository 650 mg  650 mg Rectal Q6H PRN Christina P Rama, MD      . alum & mag hydroxide-simeth (MAALOX/MYLANTA) 200-200-20 MG/5ML suspension 30 mL  30 mL Oral Q6H PRN Venetia Maxon Rama, MD      . aspirin EC tablet 81 mg  81 mg Oral Daily Venetia Maxon Rama, MD   81 mg at 02/05/15 0947  . bisacodyl (DULCOLAX) EC tablet 5 mg  5 mg Oral Daily PRN Venetia Maxon Rama, MD      .  dexamethasone (DECADRON) injection 4 mg  4 mg Intravenous 4 times per day Venetia Maxon Rama, MD   4 mg at 02/05/15 1219  . enoxaparin (LOVENOX) injection 40 mg  40 mg Subcutaneous Q24H Venetia Maxon Rama, MD   40 mg at 02/04/15 2147  . feeding supplement (ENSURE ENLIVE) (ENSURE ENLIVE) liquid 237 mL  237 mL Oral BID BM Venetia Maxon Rama, MD   237 mL at 02/05/15 0948  . fluticasone (FLONASE) 50 MCG/ACT nasal spray 1 spray  1 spray Each Nare QPM Venetia Maxon  Rama, MD   1 spray at 02/04/15 1819  . ketorolac (TORADOL) 15 MG/ML injection 15 mg  15 mg Intravenous Q6H PRN Venetia Maxon Rama, MD   15 mg at 02/04/15 1820  . morphine 4 MG/ML injection 4 mg  4 mg Intravenous Q2H PRN Venetia Maxon Rama, MD   4 mg at 02/03/15 1837  . multivitamin with minerals tablet 1 tablet  1 tablet Oral Daily Venetia Maxon Rama, MD   1 tablet at 02/05/15 0947  . omega-3 acid ethyl esters (LOVAZA) capsule 1 g  1 g Oral BID Venetia Maxon Rama, MD   1 g at 02/05/15 0947  . ondansetron (ZOFRAN) tablet 4 mg  4 mg Oral Q6H PRN Christina P Rama, MD       Or  . ondansetron (ZOFRAN) injection 4 mg  4 mg Intravenous Q6H PRN Christina P Rama, MD      . OxyCODONE (OXYCONTIN) 12 hr tablet 15 mg  15 mg Oral Q12H Venetia Maxon Rama, MD   15 mg at 02/05/15 0947  . oxyCODONE-acetaminophen (PERCOCET/ROXICET) 5-325 MG per tablet 1-2 tablet  1-2 tablet Oral Q4H PRN Venetia Maxon Rama, MD   2 tablet at 02/03/15 1709  . oxymetazoline (AFRIN) 0.05 % nasal spray 1 spray  1 spray Each Nare BID PRN Christina P Rama, MD      . pantoprazole (PROTONIX) EC tablet 40 mg  40 mg Oral Q0600 Venetia Maxon Rama, MD   40 mg at 02/05/15 0551  . polyethylene glycol (MIRALAX / GLYCOLAX) packet 17 g  17 g Oral Daily PRN Christina P Rama, MD      . psyllium (HYDROCIL/METAMUCIL) packet 1 packet  1 packet Oral Daily PRN Christina P Rama, MD      . sodium chloride 0.9 % injection 3 mL  3 mL Intravenous Q12H Venetia Maxon Rama, MD   3 mL at 02/05/15 0948  . sodium chloride 0.9 % injection 3 mL  3 mL Intravenous PRN Venetia Maxon Rama, MD   3 mL at 02/03/15 2201  . traZODone (DESYREL) tablet 150 mg  150 mg Oral QHS Venetia Maxon Rama, MD   150 mg at 02/04/15 2147     REVIEW OF SYSTEMS:  A 15 point review of systems is documented in the electronic medical record. This was obtained by the nursing staff. However, I reviewed this with the patient to discuss relevant findings and make appropriate changes.  Pertinent items are noted in HPI.     PHYSICAL EXAM:  Vitals with Age-Percentiles 0/12/3014  Length   Systolic 010  Diastolic 83  Pulse 69  Respiration 15  Weight   BMI     ECOG = 2-3  0 - Asymptomatic (Fully active, able to carry on all predisease activities without restriction)  1 - Symptomatic but completely ambulatory (Restricted in physically strenuous activity but ambulatory and able to carry out work of a light or sedentary nature. For example, light  housework, office work)  2 - Symptomatic, <50% in bed during the day (Ambulatory and capable of all self care but unable to carry out any work activities. Up and about more than 50% of waking hours)  3 - Symptomatic, >50% in bed, but not bedbound (Capable of only limited self-care, confined to bed or chair 50% or more of waking hours)  4 - Bedbound (Completely disabled. Cannot carry on any self-care. Totally confined to bed or chair)  5 - Death   Eustace Pen MM, Creech RH, Tormey DC, et al. [phone]). "Toxicity and response criteria of the [employer]". [place] Oncol. 5 (6): 649-55  _  foot drop on right - patient has good proximal strength in lower extremities bilaterally but the patient is unable to perform dorsiflexion on the right without resistance  LABORATORY DATA:  Lab Results  Component Value Date   WBC 6.4 02/03/2015   HGB 12.4* 02/03/2015   HCT 37.6* 02/03/2015   MCV 91.0 02/03/2015   PLT 130* 02/03/2015   Lab Results  Component Value Date   NA 139 02/03/2015   K 3.8 02/03/2015   CL 105 02/03/2015   CO2 26 02/03/2015   Lab Results  Component Value Date   ALT 16* 02/03/2015   AST 22 02/03/2015   ALKPHOS 59 02/03/2015   BILITOT 0.6 02/03/2015      RADIOGRAPHY: Dg Chest 2 View  02/03/2015   CLINICAL DATA:  Chest and back pain for 2 weeks. Prostate carcinoma.  EXAM: CHEST  2 VIEW  COMPARISON:  Thoracic spine radiographs on 01/15/2015  FINDINGS: The heart size and mediastinal contours are within normal limits. Both  lungs are clear. No evidence of pneumothorax or pleural effusion.  Mild thoracic spine degenerative changes again noted. A moderate wedge compression fracture of a mid thoracic vertebral body is seen which appears new since recent exam. No focal sclerotic or lytic bone lesions visualized.  IMPRESSION: New moderate wedge compression fracture of mid thoracic vertebral body.  No active cardiopulmonary disease.   Electronically Signed   By: Earle Gell M.D.   On: 02/03/2015 16:27   Dg Thoracic Spine 2 View  01/15/2015   CLINICAL DATA:  No known injury. Increasing upper back pain for 72 hours.  EXAM: THORACIC SPINE - 2-3 VIEWS  COMPARISON:  None.  FINDINGS: Normal alignment of the thoracic spine. Diffuse degenerative change with narrowed thoracic interspaces and endplate hypertrophic changes. No vertebral compression deformities. No focal bone lesion or bone destruction. No paraspinal soft tissue swelling.  IMPRESSION: Degenerative changes in the thoracic spine. Normal alignment. No acute displaced fractures identified.   Electronically Signed   By: Lucienne Capers M.D.   On: 01/15/2015 04:33   Mr Lumbar Spine Wo Contrast  02/03/2015   CLINICAL DATA:  Foot drop. RIGHT leg pain extending to calf and toes. Personal history of prostate cancer. Chronic back pain with rapid worsening of symptoms recently.  EXAM: MRI LUMBAR SPINE WITHOUT CONTRAST  TECHNIQUE: Multiplanar, multisequence MR imaging of the lumbar spine was performed. No intravenous contrast was administered.  COMPARISON:  None.  FINDINGS: Segmentation: The numbering convention used for this exam termed L5-S1 as the last intervertebral disc space.  Alignment: Mild levoconvex curve of the lumbar spine. No spondylolisthesis.  Vertebrae: Bone marrow signal shows suppression of normal fatty marrow. This is a nonspecific finding most commonly associated with obesity, anemia, cigarette smoking or chronic disease.  Metastatic disease of the lumbar spine is also  present. There  is a large vertebral body metastasis in the anterior L5 vertebra without pathologic compression fracture. The other lumbar vertebrae show mild degenerative changes without bony metastatic disease.  There is also metastatic disease to the sacrum and iliac bones bilaterally. Additional coronal images of the sacrum and SI joints were performed which demonstrate metastatic disease. Although only partially visible, some portions of the metastatic disease in the sacrum enhance however there is a large necrotic region in the medial RIGHT sacral ala. There is encroachment on the RIGHT S1 nerve in the neural foramen by the expanded RIGHT sacral ala.  Conus medullaris: Normal termination dorsal to L2. No intrathecal or dural metastases are present.  Paraspinal tissues: Bilateral renal cysts appear simple. Aortic ectasia and atherosclerosis. Maximal diameter the infrarenal abdominal aorta is 26 mm. Bilateral iliac artery ectasia is also present, with the LEFT measuring 16 mm.  Disc levels:  Disc Signal: Lower lumbar disc desiccation.  T12-L1:  Negative.  L1-L2: Mild disc degeneration but no stenosis.  L2-L3:  Negative.  L3-L4:  Disc desiccation but no stenosis.  L4-L5: Disc desiccation. Bilateral facet arthritis with mild periarticular edema and enhancement. Mild central stenosis associated with facet hypertrophy and ligamentum flavum redundancy. Mild RIGHT foraminal stenosis associated with anterior facet spurring. This potentially affects the exiting RIGHT L4 nerve. The LEFT neural foramen appears adequately patent.  L5-S1: Disc desiccation and degeneration. Central canal and subarticular zones patent. Severe RIGHT facet arthrosis is present. Very mild RIGHT foraminal stenosis associated with endplate spurring and facet spurring.  IMPRESSION: 1. 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 RIGHT S1  foraminal narrowing associated with expansile metastasis of the RIGHT sacral ala. 2. Bilateral L4-L5 facet arthritis with RIGHT foraminal stenosis potentially affecting the RIGHT L4 nerve. 3. Mild L5-S1 RIGHT foraminal stenosis potentially affecting the exiting RIGHT L5 nerve.   Electronically Signed   By: Dereck Ligas M.D.   On: 02/03/2015 15:51   Ct Biopsy  02/04/2015   CLINICAL DATA:  Large right sacral mass compatible with metastatic disease. History of prostate cancer.  EXAM: CT GUIDED CORE BIOPSY OF LARGE RIGHT SACRAL SOFT TISSUE MASS  ANESTHESIA/SEDATION: 1.0  Mg IV Versed; 50 mcg IV Fentanyl  Total Moderate Sedation Time: 5 minutes.  PROCEDURE: The procedure risks, benefits, and alternatives were explained to the patient. Questions regarding the procedure were encouraged and answered. The patient understands and consents to the procedure.  The posterior right sacral region was prepped with Betadinein a sterile fashion, and a sterile drape was applied covering the operative field. A sterile gown and sterile gloves were used for the procedure. Local anesthesia was provided with 1% Lidocaine.  Previous imaging reviewed. Patient positioned prone. Noncontrast localization CT performed. The large right sacral destructive soft tissue mass was localized. Under sterile conditions and local anesthesia, a 17 gauge 6.8 cm access needle was advanced from a posterior approach to the mass. Needle position confirmed with CT. 18 gauge core biopsies obtained. Needle removed.  Complications: None immediate  FINDINGS: Imaging confirms needle access of the posterior right sacral mass for core biopsy  IMPRESSION: Successful CT-guided right sacral soft tissue mass 18 gauge core biopsy   Electronically Signed   By: Jerilynn Mages.  Shick M.D.   On: 02/04/2015 15:37       IMPRESSION:  Patient is a 74 year old male inpatient who has been admitted to hospital due to worsening pain  in back and right lower extremities. Patient has just  underwent successful CT biopsy today.   Apparent metastatic cancer, unknown primary at this point. Patient denies any history of prostate cancer. Normal PSA this year. Patient likely a good candidate for palliative treatment to pelvis after pathological confirmation of malignancy.  PLAN: I will soon refer to currently pending pathology report from recent biopsy of right sacral mass. After review, I will follow-up with patient appropriately. The patient likely will benefit from a course of palliative radiation to the right sacrum. - Continue steroids - I recommend CT scan of chest, abdomen and pelvis.   ________________________________   Jodelle Gross, MD, PhD   **Disclaimer: This note was dictated with voice recognition software. Similar sounding words can inadvertently be transcribed and this note may contain transcription errors which may not have been corrected upon publication of note.**  This document serves as a record of services personally performed by Kyung Rudd, MD. It was created on his behalf by Derek Mound, a trained medical scribe. The creation of this record is based on the scribe's personal observations and the provider's statements to them. This document has been checked and approved by the attending provider.

## 2015-02-05 ENCOUNTER — Encounter: Payer: Self-pay | Admitting: Radiation Oncology

## 2015-02-05 DIAGNOSIS — R351 Nocturia: Secondary | ICD-10-CM

## 2015-02-05 DIAGNOSIS — E440 Moderate protein-calorie malnutrition: Secondary | ICD-10-CM

## 2015-02-05 DIAGNOSIS — N401 Enlarged prostate with lower urinary tract symptoms: Secondary | ICD-10-CM

## 2015-02-05 LAB — PSA, TOTAL AND FREE
PSA, Free Pct: 14.5 %
PSA, Free: 0.61 ng/mL
Prostate Specific Ag, Serum: 4.2 ng/mL — ABNORMAL HIGH (ref 0.0–4.0)

## 2015-02-05 NOTE — Evaluation (Signed)
Physical Therapy Evaluation Patient Details Name: Perry Nielsen MRN: [id number] DOB: 1941/03/15 Today's Date: 02/05/2015   History of Present Illness  74 y.o. 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  Clinical Impression  Pt admitted with above diagnosis. Pt currently with functional limitations due to the deficits listed below (see PT Problem List).  Pt ambulated 26' with RW and PRAFO. [employer] consult recommended for appropriate foot drop brace. Outpt PT recommended following acute stay.  Pt will benefit from skilled PT to increase their independence and safety with mobility to allow discharge to the venue listed below.       Follow Up Recommendations Outpatient PT    Equipment Recommendations  Rolling walker with 5" wheels;Other (comment) (R AFO from [employer])    Recommendations for Other Services       Precautions / Restrictions Precautions Precautions: Fall Precaution Comments: due to R foot drop      Mobility  Bed Mobility Overal bed mobility: Independent                Transfers Overall transfer level: Needs assistance Equipment used: Rolling walker (2 wheeled) Transfers: Sit to/from Stand Sit to Stand: Min guard         General transfer comment: verbal cues for hand placment, min/guard   Ambulation/Gait Ambulation/Gait assistance: Min guard Ambulation Distance (Feet): 65 Feet Assistive device: Rolling walker (2 wheeled) Gait Pattern/deviations: Step-to pattern   Gait velocity interpretation: Below normal speed for age/gender General Gait Details: PRAFO on R foot, no LOB, min/guard for safety/balance  Stairs            Wheelchair Mobility    Modified Rankin (Stroke Patients Only)       Balance Overall balance assessment: Needs assistance   Sitting balance-Leahy Scale: Good     Standing balance support: Bilateral upper extremity  supported Standing balance-Leahy Scale: Fair                               Pertinent Vitals/Pain Pain Assessment: No/denies pain    Home Living Family/patient expects to be discharged to:: Private residence Living Arrangements: Spouse/significant other Available Help at Discharge: Family;Available 24 hours/day   Home Access: Stairs to enter   Entrance Stairs-Number of Steps: 3 +1  Home Layout: Two level;Able to live on main level with bedroom/bathroom;1/2 bath on main level Home Equipment: Other (comment) (family to check to see if they have RW)      Prior Function Level of Independence: Independent               Hand Dominance        Extremity/Trunk Assessment   Upper Extremity Assessment: Overall WFL for tasks assessed           Lower Extremity Assessment: RLE deficits/detail RLE Deficits / Details: R ankle DF 1/5, PF 5/5, Inversion 2/5, eversion 1/5, sensation decr to light touch medially and dorsum of foot; knee and hip 5/5    Cervical / Trunk Assessment: Normal  Communication   Communication: No difficulties  Cognition Arousal/Alertness: Awake/alert Behavior During Therapy: WFL for tasks assessed/performed Overall Cognitive Status: Within Functional Limits for tasks assessed                      General Comments      Exercises Other Exercises Other Exercises: gastroc  stretch with sheet with 30 sec hold Other Exercises: instructed in seated LAQ and marching      Assessment/Plan    PT Assessment Patient needs continued PT services  PT Diagnosis Difficulty walking;Abnormality of gait   PT Problem List Decreased strength;Decreased balance;Decreased knowledge of use of DME;Decreased mobility  PT Treatment Interventions DME instruction;Gait training;Stair training;Functional mobility training;Therapeutic activities;Patient/family education;Therapeutic exercise;Balance training   PT Goals (Current goals can be found in the Care  Plan section) Acute Rehab PT Goals Patient Stated Goal: to swim and walk PT Goal Formulation: With patient Time For Goal Achievement: 02/19/15 Potential to Achieve Goals: Good    Frequency Min 3X/week   Barriers to discharge        Co-evaluation               End of Session Equipment Utilized During Treatment: Gait belt;Other (comment) (R PRAFO) Activity Tolerance: Patient tolerated treatment well Patient left: in chair;with call bell/phone within reach Nurse Communication: Mobility status         Time: 7116-5790 PT Time Calculation (min) (ACUTE ONLY): 30 min   Charges:   PT Evaluation $Initial PT Evaluation Tier I: 1 Procedure PT Treatments $Gait Training: 8-22 mins   PT G Codes:        Philomena Doheny 02/05/2015, 11:20 AM [phone]

## 2015-02-05 NOTE — Care Management Important Message (Signed)
Important Message  Patient Details  Name: Perry Nielsen MRN: [id number] Date of Birth: January 28, 1941   Medicare Important Message Given:  Yes-second notification given    Camillo Flaming 02/05/2015, 2:38 [place] Message  Patient Details  Name: Perry Nielsen MRN: [id number] Date of Birth: Dec 12, 1940   Medicare Important Message Given:  Yes-second notification given    Camillo Flaming 02/05/2015, 2:38 PM

## 2015-02-05 NOTE — Progress Notes (Signed)
[facility] PROGRESS NOTE   Assessment/Plan: Spine metastasis/Backache/foot drop: - MRI as below, IR consulted for biopsy. - PSA mildly elevated cont decadron. - Radio oncology consulted for possible radiation. - PT eval pending. - cont narcotics for pain.  Allergic rhinitis Cont flonase.  BPH associated with nocturia - PSA 4.2  GERD - PPI.    Code Status: full Family Communication: wife  Disposition Plan: inpatient   Consultants:  Radiation oncology  IR  Procedures:  Biopsy   Antibiotics:  None  HPI/Subjective: Relates foot weakness is better.  Objective: Filed Vitals:   02/04/15 1446 02/04/15 1452 02/04/15 2113 02/05/15 0517  BP: 135/80 128/83 131/67 118/74  Pulse: 67 69 57 58  Temp:   98.5 F (36.9 C) 98.4 F (36.9 C)  TempSrc:   Oral Oral  Resp: 12 15 14 16   Height:      Weight:      SpO2: 100% 100% 97% 95%    Intake/Output Summary (Last 24 hours) at 02/05/15 1035 Last data filed at 02/05/15 0831  Gross per 24 hour  Intake    600 ml  Output      0 ml  Net    600 ml   Filed Weights   02/03/15 1825  Weight: 74.844 kg (165 lb)    Exam:  General: Alert, awake, oriented x3, in no acute distress.  HEENT: No bruits, no goiter.  Heart: Regular rate and rhythm. Lungs: Good air movement, clear Abdomen: Soft, nontender, nondistended, positive bowel sounds.  Neuro: foot drop.   Data Reviewed: Basic Metabolic Panel:  Recent Labs Lab 02/03/15 1445 02/03/15 1540  NA  --  139  K  --  3.8  CL  --  105  CO2  --  26  GLUCOSE  --  90  BUN  --  13  CREATININE 0.90 0.87  CALCIUM  --  8.6*   Liver Function Tests:  Recent Labs Lab 02/03/15 1540  AST 22  ALT 16*  ALKPHOS 59  BILITOT 0.6  PROT 5.8*  ALBUMIN 3.0*   No results for input(s): LIPASE, AMYLASE in the last 168 hours. No results for input(s): AMMONIA in the last 168 hours. CBC:  Recent Labs Lab 02/03/15 1540  WBC 6.4  NEUTROABS 4.6  HGB 12.4*  HCT  37.6*  MCV 91.0  PLT 130*   Cardiac Enzymes: No results for input(s): CKTOTAL, CKMB, CKMBINDEX, TROPONINI in the last 168 hours. BNP (last 3 results) No results for input(s): BNP in the last 8760 hours.  ProBNP (last 3 results) No results for input(s): PROBNP in the last 8760 hours.  CBG: No results for input(s): GLUCAP in the last 168 hours.  No results found for this or any previous visit (from the past 240 hour(s)).   Studies: Dg Chest 2 View  02/03/2015   CLINICAL DATA:  Chest and back pain for 2 weeks. Prostate carcinoma.  EXAM: CHEST  2 VIEW  COMPARISON:  Thoracic spine radiographs on 01/15/2015  FINDINGS: The heart size and mediastinal contours are within normal limits. Both lungs are clear. No evidence of pneumothorax or pleural effusion.  Mild thoracic spine degenerative changes again noted. A moderate wedge compression fracture of a mid thoracic vertebral body is seen which appears new since recent exam. No focal sclerotic or lytic bone lesions visualized.  IMPRESSION: New moderate wedge compression fracture of mid thoracic vertebral body.  No active cardiopulmonary disease.   Electronically Signed   By: Sharrie Rothman.D.  On: 02/03/2015 16:27   Mr Lumbar Spine Wo Contrast  02/03/2015   CLINICAL DATA:  Foot drop. RIGHT leg pain extending to calf and toes. Personal history of prostate cancer. Chronic back pain with rapid worsening of symptoms recently.  EXAM: MRI LUMBAR SPINE WITHOUT CONTRAST  TECHNIQUE: Multiplanar, multisequence MR imaging of the lumbar spine was performed. No intravenous contrast was administered.  COMPARISON:  None.  FINDINGS: Segmentation: The numbering convention used for this exam termed L5-S1 as the last intervertebral disc space.  Alignment: Mild levoconvex curve of the lumbar spine. No spondylolisthesis.  Vertebrae: Bone marrow signal shows suppression of normal fatty marrow. This is a nonspecific finding most commonly associated with obesity, anemia,  cigarette smoking or chronic disease.  Metastatic disease of the lumbar spine is also present. There is a large vertebral body metastasis in the anterior L5 vertebra without pathologic compression fracture. The other lumbar vertebrae show mild degenerative changes without bony metastatic disease.  There is also metastatic disease to the sacrum and iliac bones bilaterally. Additional coronal images of the sacrum and SI joints were performed which demonstrate metastatic disease. Although only partially visible, some portions of the metastatic disease in the sacrum enhance however there is a large necrotic region in the medial RIGHT sacral ala. There is encroachment on the RIGHT S1 nerve in the neural foramen by the expanded RIGHT sacral ala.  Conus medullaris: Normal termination dorsal to L2. No intrathecal or dural metastases are present.  Paraspinal tissues: Bilateral renal cysts appear simple. Aortic ectasia and atherosclerosis. Maximal diameter the infrarenal abdominal aorta is 26 mm. Bilateral iliac artery ectasia is also present, with the LEFT measuring 16 mm.  Disc levels:  Disc Signal: Lower lumbar disc desiccation.  T12-L1:  Negative.  L1-L2: Mild disc degeneration but no stenosis.  L2-L3:  Negative.  L3-L4:  Disc desiccation but no stenosis.  L4-L5: Disc desiccation. Bilateral facet arthritis with mild periarticular edema and enhancement. Mild central stenosis associated with facet hypertrophy and ligamentum flavum redundancy. Mild RIGHT foraminal stenosis associated with anterior facet spurring. This potentially affects the exiting RIGHT L4 nerve. The LEFT neural foramen appears adequately patent.  L5-S1: Disc desiccation and degeneration. Central canal and subarticular zones patent. Severe RIGHT facet arthrosis is present. Very mild RIGHT foraminal stenosis associated with endplate spurring and facet spurring.  IMPRESSION: 1. Bony metastatic disease to the L5 vertebra, sacrum and both iliac bones, most  compatible with metastatic prostate cancer in a patient with history of same. RIGHT S1 foraminal narrowing associated with expansile metastasis of the RIGHT sacral ala. 2. Bilateral L4-L5 facet arthritis with RIGHT foraminal stenosis potentially affecting the RIGHT L4 nerve. 3. Mild L5-S1 RIGHT foraminal stenosis potentially affecting the exiting RIGHT L5 nerve.   Electronically Signed   By: Dereck Ligas M.D.   On: 02/03/2015 15:51   Ct Biopsy  02/04/2015   CLINICAL DATA:  Large right sacral mass compatible with metastatic disease. History of prostate cancer.  EXAM: CT GUIDED CORE BIOPSY OF LARGE RIGHT SACRAL SOFT TISSUE MASS  ANESTHESIA/SEDATION: 1.0  Mg IV Versed; 50 mcg IV Fentanyl  Total Moderate Sedation Time: 5 minutes.  PROCEDURE: The procedure risks, benefits, and alternatives were explained to the patient. Questions regarding the procedure were encouraged and answered. The patient understands and consents to the procedure.  The posterior right sacral region was prepped with Betadinein a sterile fashion, and a sterile drape was applied covering the operative field. A sterile gown and sterile gloves were used for the  procedure. Local anesthesia was provided with 1% Lidocaine.  Previous imaging reviewed. Patient positioned prone. Noncontrast localization CT performed. The large right sacral destructive soft tissue mass was localized. Under sterile conditions and local anesthesia, a 17 gauge 6.8 cm access needle was advanced from a posterior approach to the mass. Needle position confirmed with CT. 18 gauge core biopsies obtained. Needle removed.  Complications: None immediate  FINDINGS: Imaging confirms needle access of the posterior right sacral mass for core biopsy  IMPRESSION: Successful CT-guided right sacral soft tissue mass 18 gauge core biopsy   Electronically Signed   By: Jerilynn Mages.  Shick M.D.   On: 02/04/2015 15:37    Scheduled Meds: . aspirin EC  81 mg Oral Daily  . dexamethasone  4 mg Intravenous  4 times per day  . enoxaparin (LOVENOX) injection  40 mg Subcutaneous Q24H  . feeding supplement (ENSURE ENLIVE)  237 mL Oral BID BM  . fluticasone  1 spray Each Nare QPM  . multivitamin with minerals  1 tablet Oral Daily  . omega-3 acid ethyl esters  1 g Oral BID  . OxyCODONE  15 mg Oral Q12H  . pantoprazole  40 mg Oral Q0600  . sodium chloride  3 mL Intravenous Q12H  . traZODone  150 mg Oral QHS   Continuous Infusions:   Time Spent: 25 min   Charlynne Cousins  [facility] Pager [phone]. If 7PM-7AM, please contact night-coverage at [email or web address], password [email or web address] 02/05/2015, 10:35 AM  LOS: 2 days

## 2015-02-06 ENCOUNTER — Inpatient Hospital Stay (HOSPITAL_COMMUNITY): Payer: Medicare Other

## 2015-02-06 ENCOUNTER — Ambulatory Visit
Admit: 2015-02-06 | Discharge: 2015-02-06 | Disposition: A | Discharge status: Home / Self Care | Payer: Medicare Other | Source: Ambulatory Visit | Attending: Radiation Oncology | Admitting: Radiation Oncology

## 2015-02-06 DIAGNOSIS — C7951 Secondary malignant neoplasm of bone: Secondary | ICD-10-CM | POA: Diagnosis not present

## 2015-02-06 DIAGNOSIS — Z510 Encounter for antineoplastic radiation therapy: Secondary | ICD-10-CM | POA: Insufficient documentation

## 2015-02-06 DIAGNOSIS — C801 Malignant (primary) neoplasm, unspecified: Secondary | ICD-10-CM | POA: Insufficient documentation

## 2015-02-06 MED ORDER — IOHEXOL 300 MG/ML  SOLN
25.0000 mL | INTRAMUSCULAR | Status: AC
  Administered 2015-02-06 (×2): 25 mL via ORAL

## 2015-02-06 MED ORDER — IOHEXOL 300 MG/ML  SOLN
100.0000 mL | Freq: Once | INTRAMUSCULAR | Status: AC | PRN
  Administered 2015-02-06: 100 mL via INTRAVENOUS

## 2015-02-06 NOTE — Progress Notes (Signed)
[facility] PROGRESS NOTE   Assessment/Plan: Spine metastasis/Backache/foot drop: - MRI as below, IR consulted for biopsy. - PSA mildly elevated cont decadron. - Radio oncology consulted for possible radiation. - PT eval rec out patient. - cont narcotics for pain.  Allergic rhinitis Cont flonase.  BPH associated with nocturia - PSA 4.2  GERD - PPI.    Code Status: full Family Communication: wife  Disposition Plan: inpatient   Consultants:  Radiation oncology  IR  Procedures:  Biopsy   Antibiotics:  None  HPI/Subjective: Relates foot weakness is unchanged.  Objective: Filed Vitals:   02/05/15 0517 02/05/15 1417 02/05/15 2205 02/06/15 0543  BP: 118/74 111/63 120/72 127/79  Pulse: 58 61 55 58  Temp: 98.4 F (36.9 C) 97.3 F (36.3 C) 98.3 F (36.8 C) 97.8 F (36.6 C)  TempSrc: Oral Oral Oral Oral  Resp: 16 18 18 18   Height:      Weight:      SpO2: 95% 98% 98% 97%    Intake/Output Summary (Last 24 hours) at 02/06/15 1353 Last data filed at 02/06/15 1300  Gross per 24 hour  Intake    840 ml  Output   1225 ml  Net   -385 ml   Filed Weights   02/03/15 1825  Weight: 74.844 kg (165 lb)    Exam:  General: Alert, awake, oriented x3, in no acute distress.  HEENT: No bruits, no goiter.  Heart: Regular rate and rhythm. Lungs: Good air movement, clear Abdomen: Soft, nontender, nondistended, positive bowel sounds.  Neuro: foot drop.   Data Reviewed: Basic Metabolic Panel:  Recent Labs Lab 02/03/15 1445 02/03/15 1540  NA  --  139  K  --  3.8  CL  --  105  CO2  --  26  GLUCOSE  --  90  BUN  --  13  CREATININE 0.90 0.87  CALCIUM  --  8.6*   Liver Function Tests:  Recent Labs Lab 02/03/15 1540  AST 22  ALT 16*  ALKPHOS 59  BILITOT 0.6  PROT 5.8*  ALBUMIN 3.0*   No results for input(s): LIPASE, AMYLASE in the last 168 hours. No results for input(s): AMMONIA in the last 168 hours. CBC:  Recent Labs Lab  02/03/15 1540  WBC 6.4  NEUTROABS 4.6  HGB 12.4*  HCT 37.6*  MCV 91.0  PLT 130*   Cardiac Enzymes: No results for input(s): CKTOTAL, CKMB, CKMBINDEX, TROPONINI in the last 168 hours. BNP (last 3 results) No results for input(s): BNP in the last 8760 hours.  ProBNP (last 3 results) No results for input(s): PROBNP in the last 8760 hours.  CBG: No results for input(s): GLUCAP in the last 168 hours.  No results found for this or any previous visit (from the past 240 hour(s)).   Studies: Ct Biopsy  02/04/2015   CLINICAL DATA:  Large right sacral mass compatible with metastatic disease. History of prostate cancer.  EXAM: CT GUIDED CORE BIOPSY OF LARGE RIGHT SACRAL SOFT TISSUE MASS  ANESTHESIA/SEDATION: 1.0  Mg IV Versed; 50 mcg IV Fentanyl  Total Moderate Sedation Time: 5 minutes.  PROCEDURE: The procedure risks, benefits, and alternatives were explained to the patient. Questions regarding the procedure were encouraged and answered. The patient understands and consents to the procedure.  The posterior right sacral region was prepped with Betadinein a sterile fashion, and a sterile drape was applied covering the operative field. A sterile gown and sterile gloves were used for the procedure. Local anesthesia  was provided with 1% Lidocaine.  Previous imaging reviewed. Patient positioned prone. Noncontrast localization CT performed. The large right sacral destructive soft tissue mass was localized. Under sterile conditions and local anesthesia, a 17 gauge 6.8 cm access needle was advanced from a posterior approach to the mass. Needle position confirmed with CT. 18 gauge core biopsies obtained. Needle removed.  Complications: None immediate  FINDINGS: Imaging confirms needle access of the posterior right sacral mass for core biopsy  IMPRESSION: Successful CT-guided right sacral soft tissue mass 18 gauge core biopsy   Electronically Signed   By: Jerilynn Mages.  Shick M.D.   On: 02/04/2015 15:37    Scheduled  Meds: . aspirin EC  81 mg Oral Daily  . dexamethasone  4 mg Intravenous 4 times per day  . enoxaparin (LOVENOX) injection  40 mg Subcutaneous Q24H  . feeding supplement (ENSURE ENLIVE)  237 mL Oral BID BM  . fluticasone  1 spray Each Nare QPM  . multivitamin with minerals  1 tablet Oral Daily  . omega-3 acid ethyl esters  1 g Oral BID  . OxyCODONE  15 mg Oral Q12H  . pantoprazole  40 mg Oral Q0600  . sodium chloride  3 mL Intravenous Q12H  . traZODone  150 mg Oral QHS   Continuous Infusions:   Time Spent: 25 min   Charlynne Cousins  [facility] Pager [phone]. If 7PM-7AM, please contact night-coverage at [email or web address], password [facility] 02/06/2015, 1:53 PM  LOS: 3 days

## 2015-02-06 NOTE — Progress Notes (Signed)
Physical Therapy Treatment Patient Details Name: Perry Nielsen MRN: [id number] DOB: [birth year] Today's Date: 02/06/2015    History of Present Illness 74 y.o. 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    PT Comments    Increased gait distance today, progressed R ankle AAROM exercises. Pt continues to have decreased sensation medial R foot and R foot drop.   Follow Up Recommendations  Outpatient PT     Equipment Recommendations  Rolling walker with 5" wheels;Other (comment) (R AFO from [employer])    Recommendations for Other Services       Precautions / Restrictions Precautions Precautions: Fall Precaution Comments: due to R foot drop Restrictions Weight Bearing Restrictions: No    Mobility  Bed Mobility Overal bed mobility: Independent                Transfers Overall transfer level: Needs assistance Equipment used: Rolling walker (2 wheeled)   Sit to Stand: Supervision         General transfer comment: verbal cues for hand placment  Ambulation/Gait Ambulation/Gait assistance: Supervision Ambulation Distance (Feet): 140 Feet Assistive device: Rolling walker (2 wheeled) Gait Pattern/deviations: Step-to pattern   Gait velocity interpretation: Below normal speed for age/gender General Gait Details: PRAFO on R foot, no LOB, supervision for safety, heavy reliance upon BUEs for support   Stairs            Wheelchair Mobility    Modified Rankin (Stroke Patients Only)       Balance     Sitting balance-Leahy Scale: Good       Standing balance-Leahy Scale: Fair                      Cognition Arousal/Alertness: Awake/alert Behavior During Therapy: WFL for tasks assessed/performed Overall Cognitive Status: Within Functional Limits for tasks assessed                      Exercises Total Joint Exercises Ankle Circles/Pumps:  AAROM;Right;15 reps;Supine Other Exercises Other Exercises: gastroc stretch with sheet with 30 sec hold Other Exercises: supine hamstring stretch RLE Other Exercises: AAROM R ankle inversion/eversion, DF x 10 each    General Comments        Pertinent Vitals/Pain Pain Assessment: 0-10 Pain Score: 1  Pain Location: R foot, at rest and with activity Pain Descriptors / Indicators: Sore Pain Intervention(s): Monitored during session;Premedicated before session    Home Living                      Prior Function            PT Goals (current goals can now be found in the care plan section) Acute Rehab PT Goals Patient Stated Goal: to swim and walk, play golf PT Goal Formulation: With patient Time For Goal Achievement: 02/19/15 Potential to Achieve Goals: Good Progress towards PT goals: Progressing toward goals    Frequency  Min 3X/week    PT Plan Current plan remains appropriate    Co-evaluation             End of Session Equipment Utilized During Treatment: Gait belt;Other (comment) (R PRAFO) Activity Tolerance: Patient tolerated treatment well Patient left: in chair;with call bell/phone within reach     Time: 1132-1207 PT Time Calculation (min) (ACUTE ONLY): 35 min  Charges:  $Gait Training: 8-22 mins $Therapeutic Exercise:  8-22 mins                    G Codes:      Philomena Doheny 02/06/2015, 12:39 PM [phone]

## 2015-02-07 ENCOUNTER — Ambulatory Visit
Admit: 2015-02-07 | Discharge: 2015-02-07 | Disposition: A | Discharge status: Home / Self Care | Payer: Medicare Other | Attending: Radiation Oncology | Admitting: Radiation Oncology

## 2015-02-07 DIAGNOSIS — C801 Malignant (primary) neoplasm, unspecified: Secondary | ICD-10-CM | POA: Diagnosis not present

## 2015-02-07 DIAGNOSIS — Z8042 Family history of malignant neoplasm of prostate: Secondary | ICD-10-CM

## 2015-02-07 DIAGNOSIS — Z510 Encounter for antineoplastic radiation therapy: Secondary | ICD-10-CM | POA: Diagnosis not present

## 2015-02-07 DIAGNOSIS — M4850XA Collapsed vertebra, not elsewhere classified, site unspecified, initial encounter for fracture: Secondary | ICD-10-CM

## 2015-02-07 DIAGNOSIS — M899 Disorder of bone, unspecified: Secondary | ICD-10-CM

## 2015-02-07 DIAGNOSIS — C7951 Secondary malignant neoplasm of bone: Secondary | ICD-10-CM | POA: Diagnosis not present

## 2015-02-07 DIAGNOSIS — M488X6 Other specified spondylopathies, lumbar region: Secondary | ICD-10-CM

## 2015-02-07 MED ORDER — DEXAMETHASONE 4 MG PO TABS
4.0000 mg | ORAL_TABLET | Freq: Four times a day (QID) | ORAL | Status: DC
  Filled 2015-02-07 (×4): qty 1

## 2015-02-07 MED ORDER — DEXAMETHASONE 4 MG PO TABS
4.0000 mg | ORAL_TABLET | Freq: Four times a day (QID) | ORAL | Status: DC
  Administered 2015-02-07 – 2015-02-08 (×3): 4 mg via ORAL
  Filled 2015-02-07 (×7): qty 1

## 2015-02-07 NOTE — Progress Notes (Signed)
PT Cancellation Note  Patient Details Name: Perry Nielsen MRN: [id number] DOB: 1941/04/28   Cancelled Treatment:    Reason Eval/Treat Not Completed: Patient at procedure or test/unavailablewill practice steps in AM.   Claretha Cooper 02/07/2015, 5:20 PM

## 2015-02-07 NOTE — Progress Notes (Deleted)
[facility] PROGRESS NOTE   Assessment/Plan: Spine metastasis/Backache/foot drop: - MRI as below, IR consulted for biopsy. - PSA mildly elevated cont decadron. - Radio oncology started radiation on 8.4.2016 - PT eval rec out patient. - cont narcotics for pain.  Allergic rhinitis Cont flonase.  BPH associated with nocturia - PSA 4.2  GERD - PPI.    Code Status: full Family Communication: wife  Disposition Plan: inpatient   Consultants:  Radiation oncology  IR  Procedures:  Biopsy   Antibiotics:  None  HPI/Subjective: Relates foot weakness is unchanged.  Objective: Filed Vitals:   02/05/15 2205 02/06/15 0543 02/06/15 1427 02/06/15 2115  BP: 120/72 127/79 118/69 127/73  Pulse: 55 58 55 64  Temp: 98.3 F (36.8 C) 97.8 F (36.6 C) 98.1 F (36.7 C) 97.6 F (36.4 C)  TempSrc: Oral Oral Oral Oral  Resp: 18 18 18 17   Height:      Weight:      SpO2: 98% 97% 96% 98%    Intake/Output Summary (Last 24 hours) at 02/07/15 1416 Last data filed at 02/07/15 1008  Gross per 24 hour  Intake    240 ml  Output      0 ml  Net    240 ml   Filed Weights   02/03/15 1825  Weight: 74.844 kg (165 lb)    Exam:  General: Alert, awake, oriented x3, in no acute distress.  HEENT: No bruits, no goiter.  Heart: Regular rate and rhythm. Lungs: Good air movement, clear Abdomen: Soft, nontender, nondistended, positive bowel sounds.  Neuro: foot drop.   Data Reviewed: Basic Metabolic Panel:  Recent Labs Lab 02/03/15 1445 02/03/15 1540  NA  --  139  K  --  3.8  CL  --  105  CO2  --  26  GLUCOSE  --  90  BUN  --  13  CREATININE 0.90 0.87  CALCIUM  --  8.6*   Liver Function Tests:  Recent Labs Lab 02/03/15 1540  AST 22  ALT 16*  ALKPHOS 59  BILITOT 0.6  PROT 5.8*  ALBUMIN 3.0*   No results for input(s): LIPASE, AMYLASE in the last 168 hours. No results for input(s): AMMONIA in the last 168 hours. CBC:  Recent Labs Lab 02/03/15 1540    WBC 6.4  NEUTROABS 4.6  HGB 12.4*  HCT 37.6*  MCV 91.0  PLT 130*   Cardiac Enzymes: No results for input(s): CKTOTAL, CKMB, CKMBINDEX, TROPONINI in the last 168 hours. BNP (last 3 results) No results for input(s): BNP in the last 8760 hours.  ProBNP (last 3 results) No results for input(s): PROBNP in the last 8760 hours.  CBG: No results for input(s): GLUCAP in the last 168 hours.  No results found for this or any previous visit (from the past 240 hour(s)).   Studies: Ct Chest W Contrast  02/07/2015   CLINICAL DATA:  Two-month history of RIGHT hip and back pain radiating to RIGHT lower extremity, weight loss, weakness, RIGHT lateral chest discomfort and night sweats, past history basal cell skin cancer, recent abnormal MRI lumbar spine showing bony metastatic disease of L5, sacrum and ilii, question primary tumor  EXAM: CT CHEST, ABDOMEN, AND PELVIS WITH CONTRAST  TECHNIQUE: Multidetector CT imaging of the chest, abdomen and pelvis was performed following the standard protocol during bolus administration of intravenous contrast. Sagittal and coronal MPR images reconstructed from axial data set.  CONTRAST:  161mL OMNIPAQUE IOHEXOL 300 MG/ML  SOLN IV.  COMPARISON:  None  FINDINGS: CT CHEST FINDINGS  Minimal scattered atherosclerotic calcification.  Minimal gynecomastia.  Vascular structures grossly patent on nondedicated exam.  No thoracic adenopathy.  Scattered mild interstitial prominence in the periphery of the lower lobes with minimal RIGHT basilar atelectasis.  Lungs otherwise clear.  No infiltrate, pleural effusion, pneumothorax or mass/ nodule.  Compression fracture of T7 with mild anterior height loss.  No additional osseous findings/ metastatic lesions.  CT ABDOMEN AND PELVIS FINDINGS  BILATERAL renal cysts.  Splenic mass 4.9 x 4.1 x 4.4 cm with minimal central necrosis question metastatic focus.  Remainder of liver, gallbladder, spleen, pancreas, kidneys and adrenal glands normal.   Mild scattered atherosclerotic changes with minimal aneurysmal dilatation of the distal abdominal aorta 3.1 x 2.5 cm image 84.  Normal appendix.  Bladder decompressed.  Unremarkable ureters.  Prostatic enlargement gland 6.1 x 3.2 cm image 119.  Stomach and bowel loops normal.  No additional mass, adenopathy, free air, free fluid or hernia.  Large destructive metastatic focus within RIGHT sacral ala 5.0 x 4.3 x 4.1 cm.  Subtle destructive focus within the superior central aspect of the L5 vertebral body.  No additional osseous lesions identified.  IMPRESSION: Mild T7 compression fracture, unable to exclude pathologic fracture.  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  Suspected splenic metastasis.  No definite primary tumor identified.  Prostatic enlargement.   Electronically Signed   By: Lavonia Dana M.D.   On: 02/07/2015 07:52   Ct Abdomen Pelvis W Contrast  02/07/2015   CLINICAL DATA:  Two-month history of RIGHT hip and back pain radiating to RIGHT lower extremity, weight loss, weakness, RIGHT lateral chest discomfort and night sweats, past history basal cell skin cancer, recent abnormal MRI lumbar spine showing bony metastatic disease of L5, sacrum and ilii, question primary tumor  EXAM: CT CHEST, ABDOMEN, AND PELVIS WITH CONTRAST  TECHNIQUE: Multidetector CT imaging of the chest, abdomen and pelvis was performed following the standard protocol during bolus administration of intravenous contrast. Sagittal and coronal MPR images reconstructed from axial data set.  CONTRAST:  161mL OMNIPAQUE IOHEXOL 300 MG/ML  SOLN IV.  COMPARISON:  None  FINDINGS: CT CHEST FINDINGS  Minimal scattered atherosclerotic calcification.  Minimal gynecomastia.  Vascular structures grossly patent on nondedicated exam.  No thoracic adenopathy.  Scattered mild interstitial prominence in the periphery of the lower lobes with minimal RIGHT basilar atelectasis.  Lungs otherwise clear.  No infiltrate, pleural effusion,  pneumothorax or mass/ nodule.  Compression fracture of T7 with mild anterior height loss.  No additional osseous findings/ metastatic lesions.  CT ABDOMEN AND PELVIS FINDINGS  BILATERAL renal cysts.  Splenic mass 4.9 x 4.1 x 4.4 cm with minimal central necrosis question metastatic focus.  Remainder of liver, gallbladder, spleen, pancreas, kidneys and adrenal glands normal.  Mild scattered atherosclerotic changes with minimal aneurysmal dilatation of the distal abdominal aorta 3.1 x 2.5 cm image 84.  Normal appendix.  Bladder decompressed.  Unremarkable ureters.  Prostatic enlargement gland 6.1 x 3.2 cm image 119.  Stomach and bowel loops normal.  No additional mass, adenopathy, free air, free fluid or hernia.  Large destructive metastatic focus within RIGHT sacral ala 5.0 x 4.3 x 4.1 cm.  Subtle destructive focus within the superior central aspect of the L5 vertebral body.  No additional osseous lesions identified.  IMPRESSION: Mild T7 compression fracture, unable to exclude pathologic fracture.  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  Suspected splenic metastasis.  No definite primary tumor  identified.  Prostatic enlargement.   Electronically Signed   By: Lavonia Dana M.D.   On: 02/07/2015 07:52    Scheduled Meds: . aspirin EC  81 mg Oral Daily  . dexamethasone  4 mg Oral 4 times per day  . enoxaparin (LOVENOX) injection  40 mg Subcutaneous Q24H  . feeding supplement (ENSURE ENLIVE)  237 mL Oral BID BM  . fluticasone  1 spray Each Nare QPM  . multivitamin with minerals  1 tablet Oral Daily  . omega-3 acid ethyl esters  1 g Oral BID  . OxyCODONE  15 mg Oral Q12H  . pantoprazole  40 mg Oral Q0600  . sodium chloride  3 mL Intravenous Q12H  . traZODone  150 mg Oral QHS   Continuous Infusions:   Time Spent: 25 min   Charlynne Cousins  [facility] Pager [phone]. If 7PM-7AM, please contact night-coverage at [email or web address], password [facility] 02/07/2015, 2:16 PM  LOS: 4 days

## 2015-02-07 NOTE — Progress Notes (Signed)
Patient education  Done, my business card, radiation therapy and you book given to patient, discussed ways to manage side effects, symptons  To report ,spoke with patient and wife, nausea,vomiting,diarrhea, skin irritation, pain,urinary and bladder changes, use baby wipes,non alcohol, if having diarrhea go on low fiber diet,no fresh fruits or fresh vegetables, increas protein in diet, may need to eat smaller meals 5-6 with snacks in between, boost  Encouraged for no appetite, for nausea  Take rx medication prn, verbal understanding, teach back given, may not need a sitz bath, only 10 txs, but if patient wants one next week will give 2:06 PM

## 2015-02-07 NOTE — Progress Notes (Signed)
Physical Therapy Treatment Patient Details Name: Perry Nielsen MRN: [id number] DOB: [birth year] Today's Date: 02/07/2015    History of Present Illness 74 y.o. 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    PT Comments    Patient  Is ambulating with RW and PRAFO on R ankle. Will practice steps this PM>  Follow Up Recommendations  Outpatient PT ([employer] for long term AFO as needed.)     Equipment Recommendations  Rolling walker with 5" wheels;Other (comment)    Recommendations for Other Services       Precautions / Restrictions Precautions Precautions: Fall Precaution Comments: due to R foot drop Required Braces or Orthoses: Other Brace/Splint Other Brace/Splint: PRAFO on R    Mobility  Bed Mobility Overal bed mobility: Independent                Transfers Overall transfer level: Needs assistance Equipment used: Rolling walker (2 wheeled) Transfers: Sit to/from Stand Sit to Stand: Supervision         General transfer comment: verbal cues for hand placment  Ambulation/Gait Ambulation/Gait assistance: Supervision Ambulation Distance (Feet): 300 Feet Assistive device: Rolling walker (2 wheeled) Gait Pattern/deviations: Step-to pattern;Step-through pattern;Decreased step length - right   Gait velocity interpretation: Below normal speed for age/gender General Gait Details: PRAFO on R foot, no LOB, supervision for safety, heavy reliance upon BUEs for support   Stairs            Wheelchair Mobility    Modified Rankin (Stroke Patients Only)       Balance           Standing balance support: No upper extremity supported;During functional activity Standing balance-Leahy Scale: Fair                      Cognition Arousal/Alertness: Awake/alert                          Exercises      General Comments        Pertinent  Vitals/Pain Pain Score: 2  Pain Location: R foot Pain Descriptors / Indicators: Tender Pain Intervention(s): Monitored during session    Home Living                      Prior Function            PT Goals (current goals can now be found in the care plan section) Progress towards PT goals: Progressing toward goals    Frequency  Min 3X/week    PT Plan Current plan remains appropriate    Co-evaluation             End of Session Equipment Utilized During Treatment: Other (comment) (PRAFO, R) Activity Tolerance: Patient tolerated treatment well Patient left: in bed;with call bell/phone within reach;with family/visitor present;with bed alarm set     Time: 4010-2725 PT Time Calculation (min) (ACUTE ONLY): 15 min  Charges:  $Gait Training: 8-22 mins                    G Codes:      Claretha Cooper 02/07/2015, 1:00 PM Tresa Endo PT [phone]  .

## 2015-02-07 NOTE — Consult Note (Signed)
Perry Nielsen  Telephone:([phone]) Perry Nielsen                                MR#: [id number]  DOB: September 15, 1940                       CSN#: [id number]  Referring MD: Dr. Sheliah Plane [facility] Patient Care Team: Dorena Cookey, MD as PCP - General  Reason for Consult: Metastatic Bone Lesions   [id number] Perry Nielsen is a 74 y.o. male admitted with progressive, 2 month history of right hip and back pain with radiation to the right leg, subsequent foot drop. Since early June he had been seen as outpatient, but his symptoms failed to conservative management. Until then, he was active, exercising frequently. During this admission, patient reported a14 lb weight loss, right lateral chest wall pain and intermittent night sweats. Patient denied any bowel or bladder incontinence or retention. He was increasingly fatigued. He denied any respiratory or cardiac complaints. Denies any tobacco or alcohol history. 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 Outpatient MRI L spine on 7/31revealed metastatic L5, sacrum, bilateral iliac bony disease. A right S1 foraminal narrowing expanding to he right sacral ala was seen as well measuring.5.0 x 4.3 x 4.1 cm . CT chest, abdomen and pelvis with contrast on 8/4 revealed a mid T7 compression fracture and suspected splenic metastasis measuring 4.9 x 4.1x4.4 cm. Prostate is enlarged at 6.1 cm by 3.2 cm. Biopsy of the Right sacral mass was performed by IR on 8/1 with results pending. He was placed on narcotics and Decadron. Radiation oncology initiated simulation and radiation. We were requested to see this patient with recommendations.        PMH:  Past Medical History  Diagnosis Date  . 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 ALLERGIC RHINITIS 08/03/2007  . GERD 02/18/2007  . BENIGN PROSTATIC HYPERTROPHY 02/18/2007  . KNEE PAIN, LEFT, CHRONIC 08/07/2008  .  BACK PAIN 09/18/2009  . HAMMER TOE 08/03/2007  . 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 CAROTID BRUIT, LEFT 08/07/2008  . Abdominal pain, right upper quadrant 09/18/2009  . Skin cancer   . Plantar fasciitis of right foot 03/24/2012  . BPH associated with nocturia 02/18/2007    Qualifier: Diagnosis of  By: Tiney Rouge CMA, Ellison Hughs      Surgeries:  Past Surgical History  Procedure Laterality Date  . Shoulder open rotator cuff repair  07/06/05    right  . Knee arthroscopy      left  . Tonsillectomy and adenoidectomy    . Transurethral resection of prostate    . Cataract extraction w/ intraocular lens  implant, bilateral      Allergies:  Allergies  Allergen Reactions  . Antihistamines, Diphenhydramine-Type     CAUSES PROSTATE TO SWELL    Medications:   Prior to Admission:  Prescriptions prior to admission  Medication Sig Dispense Refill Last Dose  . aspirin EC 81 MG tablet Take 81 mg by mouth daily.   02/02/2015 at Unknown time  . fish oil-omega-3 fatty acids 1000 MG capsule Take 2 g by mouth daily.     Past Week at Unknown time  . fluticasone (FLONASE) 50 MCG/ACT nasal spray Place 1 spray into both nostrils every evening.  02/02/2015 at Unknown time  . glucosamine-chondroitin 500-400 MG tablet Take 2 tablets by mouth daily.    02/02/2015 at Unknown time  . HYDROcodone-acetaminophen (NORCO/VICODIN) 5-325 MG per tablet Take 1 tablet by mouth every 6 (six) hours as needed for moderate pain. 30 tablet 0 02/03/2015 at Unknown time  . ibuprofen (ADVIL,MOTRIN) 200 MG tablet Take 400-800 mg by mouth every 6 (six) hours as needed (for pain.).   02/02/2015 at Unknown time  . Multiple Vitamin (MULTIVITAMIN) tablet Take 1 tablet by mouth daily.     02/02/2015 at Unknown time  . oxymetazoline (AFRIN) 0.05 % nasal spray Place 1 spray into both nostrils 2 (two) times daily as needed for congestion.   Past Week at Unknown time  . psyllium (HYDROCIL/METAMUCIL) 95 % PACK Take 1 packet by mouth  daily as needed for mild constipation or moderate constipation.    Past Week at Unknown time  . sildenafil (VIAGRA) 100 MG tablet Take 0.5-1 tablets (50-100 mg total) by mouth daily as needed for erectile dysfunction. 10 tablet 10 a week  . tetrahydrozoline 0.05 % ophthalmic solution Place 1-2 drops into both eyes 4 (four) times daily as needed (for eye irritation).   02/02/2015 at Unknown time  . traMADol (ULTRAM) 50 MG tablet Take 1 tablet by mouth 2 (two) times daily as needed for moderate pain.   0 Past Week at Unknown time  . traZODone (DESYREL) 150 MG tablet Take 1 tablet (150 mg total) by mouth at bedtime. 90 tablet 3 02/02/2015 at Unknown time   Scheduled Meds: . aspirin EC  81 mg Oral Daily  . dexamethasone  4 mg Oral 4 times per day  . enoxaparin (LOVENOX) injection  40 mg Subcutaneous Q24H  . feeding supplement (ENSURE ENLIVE)  237 mL Oral BID BM  . fluticasone  1 spray Each Nare QPM  . multivitamin with minerals  1 tablet Oral Daily  . omega-3 acid ethyl esters  1 g Oral BID  . OxyCODONE  15 mg Oral Q12H  . pantoprazole  40 mg Oral Q0600  . sodium chloride  3 mL Intravenous Q12H  . traZODone  150 mg Oral QHS   Continuous Infusions:  PRN Meds:.sodium chloride, acetaminophen **OR** acetaminophen, alum & mag hydroxide-simeth, bisacodyl, ketorolac, morphine injection, ondansetron **OR** ondansetron (ZOFRAN) IV, oxyCODONE-acetaminophen, oxymetazoline, polyethylene glycol, psyllium, sodium chloride   ROS: Constitutional: Denies fevers, chills, has night sweats Eyes: Denies blurriness of vision, double vision or watery eyes. He has a history of cataract surgery Ears, nose, mouth, throat, and face: Denies mucositis or sore throat Respiratory: Denies cough, dyspnea or wheezes Cardiovascular: Denies palpitations, chest pain or lower extremity swelling Gastrointestinal:  Denies nausea, heartburn or change in bowel habits GU: Denies urine incontinence but increased frequency. He also  reports pain with climax during sexual activity. Skin: Denies abnormal skin rashes.He has a history of squamous cell carcinoma of the scalp. Lymphatics: Denies new lymphadenopathy or easy bruising Neurological: He reports a right foot drop with decreased strength, otherwise no other neuro deficits verbalized Behavioral/Psych: Mood is stable, no new changes  All other systems were reviewed with the patient and are negative.   Family History:    Family History  Problem Relation Age of Onset  . Heart attack Other   . Aneurysm Other     aortic  . Coronary artery disease Sister   . Cancer Father     prostate  . Pulmonary fibrosis Mother   . Uterine cancer Mother  No family history of hematological  disorders.  Social History:  reports that he has never smoked. He does not have any smokeless tobacco history on file. He reports that he drinks alcohol. He reports that he does not use illicit drugs. He is a retired Proofreader Vitals:   02/06/15 2115  BP: 127/73  Pulse: 64  Temp: 97.6 F (36.4 C)  Resp: 17   Filed Weights   02/03/15 1825  Weight: 165 lb (74.844 kg)    GENERAL:alert, no distress and comfortable SKIN: skin color, texture, turgor are normal, no rashes or significant lesions. Blue hemangioma noted on upper back EYES: normal, conjunctiva are pink and non-injected, sclera clear OROPHARYNX:no exudate, no erythema and lips, buccal mucosa, and tongue normal  NECK: supple, thyroid normal size, non-tender, without nodularity LYMPH:  no palpable lymphadenopathy in the cervical, axillary or inguinal area LUNGS: clear to auscultation and percussion with normal breathing effort HEART: regular rate & rhythm and no murmurs and no lower extremity edema ABDOMEN:abdomen soft, non-tender and normal bowel sounds Musculoskeletal:no cyanosis of digits and no clubbing. No spinal tenderness, mild right hip tenderness on palpation PSYCH:  alert & oriented x 3 with fluent speech NEURO: right foot drop without other motor/sensory deficits    Labs:  CBC   Recent Labs Lab 02/03/15 1540  WBC 6.4  HGB 12.4*  HCT 37.6*  PLT 130*  MCV 91.0  MCH 30.0  MCHC 33.0  RDW 12.8  LYMPHSABS 1.3  MONOABS 0.4  EOSABS 0.1  BASOSABS 0.0     CMP    Recent Labs Lab 02/03/15 1445 02/03/15 1540  NA  --  139  K  --  3.8  CL  --  105  CO2  --  26  GLUCOSE  --  90  BUN  --  13  CREATININE 0.90 0.87  CALCIUM  --  8.6*  AST  --  22  ALT  --  16*  ALKPHOS  --  59  BILITOT  --  0.6     Imaging Studies:  Dg Chest 2 View  02/03/2015   CLINICAL DATA:  Chest and back pain for 2 weeks. Prostate carcinoma.  EXAM: CHEST  2 VIEW  COMPARISON:  Thoracic spine radiographs on 01/15/2015  FINDINGS: The heart size and mediastinal contours are within normal limits. Both lungs are clear. No evidence of pneumothorax or pleural effusion.  Mild thoracic spine degenerative changes again noted. A moderate wedge compression fracture of a mid thoracic vertebral body is seen which appears new since recent exam. No focal sclerotic or lytic bone lesions visualized.  IMPRESSION: New moderate wedge compression fracture of mid thoracic vertebral body.  No active cardiopulmonary disease.   Electronically Signed   By: Earle Gell M.D.   On: 02/03/2015 16:27   Dg Thoracic Spine 2 View  01/15/2015   CLINICAL DATA:  No known injury. Increasing upper back pain for 72 hours.  EXAM: THORACIC SPINE - 2-3 VIEWS  COMPARISON:  None.  FINDINGS: Normal alignment of the thoracic spine. Diffuse degenerative change with narrowed thoracic interspaces and endplate hypertrophic changes. No vertebral compression deformities. No focal bone lesion or bone destruction. No paraspinal soft tissue swelling.  IMPRESSION: Degenerative changes in the thoracic spine. Normal alignment. No acute displaced fractures identified.   Electronically Signed   By: Lucienne Capers M.D.   On:  01/15/2015 04:33   Ct Chest W Contrast  02/07/2015  CLINICAL DATA:  Two-month history of RIGHT hip and back pain radiating to RIGHT lower extremity, weight loss, weakness, RIGHT lateral chest discomfort and night sweats, past history basal cell skin cancer, recent abnormal MRI lumbar spine showing bony metastatic disease of L5, sacrum and ilii, question primary tumor  EXAM: CT CHEST, ABDOMEN, AND PELVIS WITH CONTRAST  TECHNIQUE: Multidetector CT imaging of the chest, abdomen and pelvis was performed following the standard protocol during bolus administration of intravenous contrast. Sagittal and coronal MPR images reconstructed from axial data set.  CONTRAST:  158m OMNIPAQUE IOHEXOL 300 MG/ML  SOLN IV.  COMPARISON:  None  FINDINGS: CT CHEST FINDINGS  Minimal scattered atherosclerotic calcification.  Minimal gynecomastia.  Vascular structures grossly patent on nondedicated exam.  No thoracic adenopathy.  Scattered mild interstitial prominence in the periphery of the lower lobes with minimal RIGHT basilar atelectasis.  Lungs otherwise clear.  No infiltrate, pleural effusion, pneumothorax or mass/ nodule.  Compression fracture of T7 with mild anterior height loss.  No additional osseous findings/ metastatic lesions.  CT ABDOMEN AND PELVIS FINDINGS  BILATERAL renal cysts.  Splenic mass 4.9 x 4.1 x 4.4 cm with minimal central necrosis question metastatic focus.  Remainder of liver, gallbladder, spleen, pancreas, kidneys and adrenal glands normal.  Mild scattered atherosclerotic changes with minimal aneurysmal dilatation of the distal abdominal aorta 3.1 x 2.5 cm image 84.  Normal appendix.  Bladder decompressed.  Unremarkable ureters.  Prostatic enlargement gland 6.1 x 3.2 cm image 119.  Stomach and bowel loops normal.  No additional mass, adenopathy, free air, free fluid or hernia.  Large destructive metastatic focus within RIGHT sacral ala 5.0 x 4.3 x 4.1 cm.  Subtle destructive focus within the superior central  aspect of the L5 vertebral body.  No additional osseous lesions identified.  IMPRESSION: Mild T7 compression fracture, unable to exclude pathologic fracture.  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  Suspected splenic metastasis.  No definite primary tumor identified.  Prostatic enlargement.   Electronically Signed   By: MLavonia DanaM.D.   On: 02/07/2015 07:52   Mr Lumbar Spine Wo Contrast  02/03/2015   CLINICAL DATA:  Foot drop. RIGHT leg pain extending to calf and toes. Personal history of prostate cancer. Chronic back pain with rapid worsening of symptoms recently.  EXAM: MRI LUMBAR SPINE WITHOUT CONTRAST  TECHNIQUE: Multiplanar, multisequence MR imaging of the lumbar spine was performed. No intravenous contrast was administered.  COMPARISON:  None.  FINDINGS: Segmentation: The numbering convention used for this exam termed L5-S1 as the last intervertebral disc space.  Alignment: Mild levoconvex curve of the lumbar spine. No spondylolisthesis.  Vertebrae: Bone marrow signal shows suppression of normal fatty marrow. This is a nonspecific finding most commonly associated with obesity, anemia, cigarette smoking or chronic disease.  Metastatic disease of the lumbar spine is also present. There is a large vertebral body metastasis in the anterior L5 vertebra without pathologic compression fracture. The other lumbar vertebrae show mild degenerative changes without bony metastatic disease.  There is also metastatic disease to the sacrum and iliac bones bilaterally. Additional coronal images of the sacrum and SI joints were performed which demonstrate metastatic disease. Although only partially visible, some portions of the metastatic disease in the sacrum enhance however there is a large necrotic region in the medial RIGHT sacral ala. There is encroachment on the RIGHT S1 nerve in the neural foramen by the expanded RIGHT sacral ala.  Conus medullaris: Normal termination dorsal to L2. No intrathecal  or  dural metastases are present.  Paraspinal tissues: Bilateral renal cysts appear simple. Aortic ectasia and atherosclerosis. Maximal diameter the infrarenal abdominal aorta is 26 mm. Bilateral iliac artery ectasia is also present, with the LEFT measuring 16 mm.  Disc levels:  Disc Signal: Lower lumbar disc desiccation.  T12-L1:  Negative.  L1-L2: Mild disc degeneration but no stenosis.  L2-L3:  Negative.  L3-L4:  Disc desiccation but no stenosis.  L4-L5: Disc desiccation. Bilateral facet arthritis with mild periarticular edema and enhancement. Mild central stenosis associated with facet hypertrophy and ligamentum flavum redundancy. Mild RIGHT foraminal stenosis associated with anterior facet spurring. This potentially affects the exiting RIGHT L4 nerve. The LEFT neural foramen appears adequately patent.  L5-S1: Disc desiccation and degeneration. Central canal and subarticular zones patent. Severe RIGHT facet arthrosis is present. Very mild RIGHT foraminal stenosis associated with endplate spurring and facet spurring.  IMPRESSION: 1. 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 RIGHT S1 foraminal narrowing associated with expansile metastasis of the RIGHT sacral ala. 2. Bilateral L4-L5 facet arthritis with RIGHT foraminal stenosis potentially affecting the RIGHT L4 nerve. 3. Mild L5-S1 RIGHT foraminal stenosis potentially affecting the exiting RIGHT L5 nerve.   Electronically Signed   By: Dereck Ligas M.D.   On: 02/03/2015 15:51   Ct Abdomen Pelvis W Contrast  02/07/2015   CLINICAL DATA:  Two-month history of RIGHT hip and back pain radiating to RIGHT lower extremity, weight loss, weakness, RIGHT lateral chest discomfort and night sweats, past history basal cell skin cancer, recent abnormal MRI lumbar spine showing bony metastatic disease of L5, sacrum and ilii, question primary tumor  EXAM: CT CHEST,  ABDOMEN, AND PELVIS WITH CONTRAST  TECHNIQUE: Multidetector CT imaging of the chest, abdomen and pelvis was performed following the standard protocol during bolus administration of intravenous contrast. Sagittal and coronal MPR images reconstructed from axial data set.  CONTRAST:  163m OMNIPAQUE IOHEXOL 300 MG/ML  SOLN IV.  COMPARISON:  None  FINDINGS: CT CHEST FINDINGS  Minimal scattered atherosclerotic calcification.  Minimal gynecomastia.  Vascular structures grossly patent on nondedicated exam.  No thoracic adenopathy.  Scattered mild interstitial prominence in the periphery of the lower lobes with minimal RIGHT basilar atelectasis.  Lungs otherwise clear.  No infiltrate, pleural effusion, pneumothorax or mass/ nodule.  Compression fracture of T7 with mild anterior height loss.  No additional osseous findings/ metastatic lesions.  CT ABDOMEN AND PELVIS FINDINGS  BILATERAL renal cysts.  Splenic mass 4.9 x 4.1 x 4.4 cm with minimal central necrosis question metastatic focus.  Remainder of liver, gallbladder, spleen, pancreas, kidneys and adrenal glands normal.  Mild scattered atherosclerotic changes with minimal aneurysmal dilatation of the distal abdominal aorta 3.1 x 2.5 cm image 84.  Normal appendix.  Bladder decompressed.  Unremarkable ureters.  Prostatic enlargement gland 6.1 x 3.2 cm image 119.  Stomach and bowel loops normal.  No additional mass, adenopathy, free air, free fluid or hernia.  Large destructive metastatic focus within RIGHT sacral ala 5.0 x 4.3 x 4.1 cm.  Subtle destructive focus within the superior central aspect of the L5 vertebral body.  No additional osseous lesions identified.  IMPRESSION: Mild T7 compression fracture, unable to exclude pathologic fracture.  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  Suspected splenic metastasis.  No definite primary tumor identified.  Prostatic enlargement.   Electronically Signed   By: MCrist InfanteD.  On: 02/07/2015 07:52   Ct  Biopsy  02/04/2015   CLINICAL DATA:  Large right sacral mass compatible with metastatic disease. History of prostate cancer.  EXAM: CT GUIDED CORE BIOPSY OF LARGE RIGHT SACRAL SOFT TISSUE MASS  ANESTHESIA/SEDATION: 1.0  Mg IV Versed; 50 mcg IV Fentanyl  Total Moderate Sedation Time: 5 minutes.  PROCEDURE: The procedure risks, benefits, and alternatives were explained to the patient. Questions regarding the procedure were encouraged and answered. The patient understands and consents to the procedure.  The posterior right sacral region was prepped with Betadinein a sterile fashion, and a sterile drape was applied covering the operative field. A sterile gown and sterile gloves were used for the procedure. Local anesthesia was provided with 1% Lidocaine.  Previous imaging reviewed. Patient positioned prone. Noncontrast localization CT performed. The large right sacral destructive soft tissue mass was localized. Under sterile conditions and local anesthesia, a 17 gauge 6.8 cm access needle was advanced from a posterior approach to the mass. Needle position confirmed with CT. 18 gauge core biopsies obtained. Needle removed.  Complications: None immediate  FINDINGS: Imaging confirms needle access of the posterior right sacral mass for core biopsy  IMPRESSION: Successful CT-guided right sacral soft tissue mass 18 gauge core biopsy   Electronically Signed   By: Jerilynn Mages.  Shick M.D.   On: 02/04/2015 15:37      A/P: 74 y.o. male with   Metastatic Bone Lesions Outpatient MRI L spine on 7/31 revealed metastatic L5, sacrum, bilateral iliac bony disease including a large right S1 mass with foraminal narrowing expanding to the right sacral ala   CT chest, abdomen and pelvis with contrast on 8/4 revealed a mid T7 compression fracture and suspected splenic metastasis  Prostate is enlarged at 6.1 cm by 3.2 cm.  Biopsy of the Right sacral mass was performed by IR on 8/1 with results pending.  He was placed on narcotics and  Decadron.  Radiation oncology initiated palliative radiation. Will follow the results in other to proceed with further treatment plans.  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   Rondel Jumbo, PA-C 02/07/2015 3:07 PM  Perry Nielsen was interviewed and examined. I reviewed the CT images with Perry Nielsen and his wife.  He presents with pain related to multiple bone lesions including a right sacral lesion with compression of the right S1 nerve root. There is degenerative spine disease affecting the right L4 and L5 nerve roots. The bone lesions appear lytic.  I discussed the differential diagnosis with Perry Nielsen and his family. Preliminary review of the sacral biopsy by pathology is most consistent with a hematopoietic malignancy. The clinical presentation is consistent with a diagnosis of multiple myeloma or lymphoma.  We will wait on additional immunohistochemical stains and then decide on further staging and treatment recommendations.  Recommendations: 1. Continue steroids and palliative radiation to the sacral mass 2. Narcotic analgesics as needed for pain, consider discontinuing the OxyContin 3. Follow-up pathology on 02/08/2015 4. Additional staging and treatment recommendations to follow pending the pathology result 5. Outpatient follow-up will be scheduled at the [facility]

## 2015-02-07 NOTE — Progress Notes (Signed)
Date:  February 07, 2015 U.R. performed for needs and level of care. Will continue to follow for Case Management needs.  Merve Hotard, RN, BSN, CCM   [phone] 

## 2015-02-07 NOTE — Progress Notes (Addendum)
[facility] PROGRESS NOTE   Assessment/Plan: Spine metastasis/Backache/foot drop: - MRI as below, IR consulted for biopsy on 8.1.2016. - Change decadron to oral. Consult oncology staining results pending. - Radio oncology consulted started radiation on 8.4.2016. - PT eval rec out patient. - Cont narcotics for pain.  Allergic rhinitis Cont flonase.  BPH associated with nocturia - PSA 4.2  GERD: - PPI.    Code Status: full Family Communication: wife  Disposition Plan: inpatient   Consultants:  Radiation oncology  IR  Procedures:  Biopsy   Antibiotics:  None  HPI/Subjective: No complains  Objective: Filed Vitals:   02/05/15 2205 02/06/15 0543 02/06/15 1427 02/06/15 2115  BP: 120/72 127/79 118/69 127/73  Pulse: 55 58 55 64  Temp: 98.3 F (36.8 C) 97.8 F (36.6 C) 98.1 F (36.7 C) 97.6 F (36.4 C)  TempSrc: Oral Oral Oral Oral  Resp: 18 18 18 17   Height:      Weight:      SpO2: 98% 97% 96% 98%    Intake/Output Summary (Last 24 hours) at 02/07/15 1421 Last data filed at 02/07/15 1008  Gross per 24 hour  Intake    240 ml  Output      0 ml  Net    240 ml   Filed Weights   02/03/15 1825  Weight: 74.844 kg (165 lb)    Exam:  General: Alert, awake, oriented x3, in no acute distress.  HEENT: No bruits, no goiter.  Heart: Regular rate and rhythm. Lungs: Good air movement, clear Abdomen: Soft, nontender, nondistended, positive bowel sounds.  Neuro: foot drop.   Data Reviewed: Basic Metabolic Panel:  Recent Labs Lab 02/03/15 1445 02/03/15 1540  NA  --  139  K  --  3.8  CL  --  105  CO2  --  26  GLUCOSE  --  90  BUN  --  13  CREATININE 0.90 0.87  CALCIUM  --  8.6*   Liver Function Tests:  Recent Labs Lab 02/03/15 1540  AST 22  ALT 16*  ALKPHOS 59  BILITOT 0.6  PROT 5.8*  ALBUMIN 3.0*   No results for input(s): LIPASE, AMYLASE in the last 168 hours. No results for input(s): AMMONIA in the last 168  hours. CBC:  Recent Labs Lab 02/03/15 1540  WBC 6.4  NEUTROABS 4.6  HGB 12.4*  HCT 37.6*  MCV 91.0  PLT 130*   Cardiac Enzymes: No results for input(s): CKTOTAL, CKMB, CKMBINDEX, TROPONINI in the last 168 hours. BNP (last 3 results) No results for input(s): BNP in the last 8760 hours.  ProBNP (last 3 results) No results for input(s): PROBNP in the last 8760 hours.  CBG: No results for input(s): GLUCAP in the last 168 hours.  No results found for this or any previous visit (from the past 240 hour(s)).   Studies: Ct Chest W Contrast  02/07/2015   CLINICAL DATA:  Two-month history of RIGHT hip and back pain radiating to RIGHT lower extremity, weight loss, weakness, RIGHT lateral chest discomfort and night sweats, past history basal cell skin cancer, recent abnormal MRI lumbar spine showing bony metastatic disease of L5, sacrum and ilii, question primary tumor  EXAM: CT CHEST, ABDOMEN, AND PELVIS WITH CONTRAST  TECHNIQUE: Multidetector CT imaging of the chest, abdomen and pelvis was performed following the standard protocol during bolus administration of intravenous contrast. Sagittal and coronal MPR images reconstructed from axial data set.  CONTRAST:  135mL OMNIPAQUE IOHEXOL 300 MG/ML  SOLN  IV.  COMPARISON:  None  FINDINGS: CT CHEST FINDINGS  Minimal scattered atherosclerotic calcification.  Minimal gynecomastia.  Vascular structures grossly patent on nondedicated exam.  No thoracic adenopathy.  Scattered mild interstitial prominence in the periphery of the lower lobes with minimal RIGHT basilar atelectasis.  Lungs otherwise clear.  No infiltrate, pleural effusion, pneumothorax or mass/ nodule.  Compression fracture of T7 with mild anterior height loss.  No additional osseous findings/ metastatic lesions.  CT ABDOMEN AND PELVIS FINDINGS  BILATERAL renal cysts.  Splenic mass 4.9 x 4.1 x 4.4 cm with minimal central necrosis question metastatic focus.  Remainder of liver, gallbladder, spleen,  pancreas, kidneys and adrenal glands normal.  Mild scattered atherosclerotic changes with minimal aneurysmal dilatation of the distal abdominal aorta 3.1 x 2.5 cm image 84.  Normal appendix.  Bladder decompressed.  Unremarkable ureters.  Prostatic enlargement gland 6.1 x 3.2 cm image 119.  Stomach and bowel loops normal.  No additional mass, adenopathy, free air, free fluid or hernia.  Large destructive metastatic focus within RIGHT sacral ala 5.0 x 4.3 x 4.1 cm.  Subtle destructive focus within the superior central aspect of the L5 vertebral body.  No additional osseous lesions identified.  IMPRESSION: Mild T7 compression fracture, unable to exclude pathologic fracture.  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  Suspected splenic metastasis.  No definite primary tumor identified.  Prostatic enlargement.   Electronically Signed   By: Lavonia Dana M.D.   On: 02/07/2015 07:52   Ct Abdomen Pelvis W Contrast  02/07/2015   CLINICAL DATA:  Two-month history of RIGHT hip and back pain radiating to RIGHT lower extremity, weight loss, weakness, RIGHT lateral chest discomfort and night sweats, past history basal cell skin cancer, recent abnormal MRI lumbar spine showing bony metastatic disease of L5, sacrum and ilii, question primary tumor  EXAM: CT CHEST, ABDOMEN, AND PELVIS WITH CONTRAST  TECHNIQUE: Multidetector CT imaging of the chest, abdomen and pelvis was performed following the standard protocol during bolus administration of intravenous contrast. Sagittal and coronal MPR images reconstructed from axial data set.  CONTRAST:  121mL OMNIPAQUE IOHEXOL 300 MG/ML  SOLN IV.  COMPARISON:  None  FINDINGS: CT CHEST FINDINGS  Minimal scattered atherosclerotic calcification.  Minimal gynecomastia.  Vascular structures grossly patent on nondedicated exam.  No thoracic adenopathy.  Scattered mild interstitial prominence in the periphery of the lower lobes with minimal RIGHT basilar atelectasis.  Lungs otherwise  clear.  No infiltrate, pleural effusion, pneumothorax or mass/ nodule.  Compression fracture of T7 with mild anterior height loss.  No additional osseous findings/ metastatic lesions.  CT ABDOMEN AND PELVIS FINDINGS  BILATERAL renal cysts.  Splenic mass 4.9 x 4.1 x 4.4 cm with minimal central necrosis question metastatic focus.  Remainder of liver, gallbladder, spleen, pancreas, kidneys and adrenal glands normal.  Mild scattered atherosclerotic changes with minimal aneurysmal dilatation of the distal abdominal aorta 3.1 x 2.5 cm image 84.  Normal appendix.  Bladder decompressed.  Unremarkable ureters.  Prostatic enlargement gland 6.1 x 3.2 cm image 119.  Stomach and bowel loops normal.  No additional mass, adenopathy, free air, free fluid or hernia.  Large destructive metastatic focus within RIGHT sacral ala 5.0 x 4.3 x 4.1 cm.  Subtle destructive focus within the superior central aspect of the L5 vertebral body.  No additional osseous lesions identified.  IMPRESSION: Mild T7 compression fracture, unable to exclude pathologic fracture.  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  Suspected splenic metastasis.  No definite primary tumor identified.  Prostatic enlargement.   Electronically Signed   By: Lavonia Dana M.D.   On: 02/07/2015 07:52    Scheduled Meds: . aspirin EC  81 mg Oral Daily  . dexamethasone  4 mg Oral 4 times per day  . enoxaparin (LOVENOX) injection  40 mg Subcutaneous Q24H  . feeding supplement (ENSURE ENLIVE)  237 mL Oral BID BM  . fluticasone  1 spray Each Nare QPM  . multivitamin with minerals  1 tablet Oral Daily  . omega-3 acid ethyl esters  1 g Oral BID  . OxyCODONE  15 mg Oral Q12H  . pantoprazole  40 mg Oral Q0600  . sodium chloride  3 mL Intravenous Q12H  . traZODone  150 mg Oral QHS   Continuous Infusions:   Time Spent: 25 min   Charlynne Cousins  [facility] Pager [phone]. If 7PM-7AM, please contact night-coverage at [email or web address], password  [facility] 02/07/2015, 2:21 PM  LOS: 4 days

## 2015-02-08 ENCOUNTER — Ambulatory Visit
Admit: 2015-02-08 | Discharge: 2015-02-08 | Disposition: A | Discharge status: Home / Self Care | Payer: Medicare Other | Attending: Radiation Oncology | Admitting: Radiation Oncology

## 2015-02-08 ENCOUNTER — Ambulatory Visit: Admit: 2015-02-08 | Payer: Medicare Other | Admitting: Radiation Oncology

## 2015-02-08 ENCOUNTER — Telehealth: Payer: Self-pay | Admitting: Oncology

## 2015-02-08 ENCOUNTER — Ambulatory Visit
Admission: RE | Admit: 2015-02-08 | Discharge: 2015-02-08 | Disposition: A | Discharge status: Home / Self Care | Payer: Medicare Other | Source: Ambulatory Visit | Attending: Radiation Oncology | Admitting: Radiation Oncology

## 2015-02-08 DIAGNOSIS — C801 Malignant (primary) neoplasm, unspecified: Secondary | ICD-10-CM | POA: Diagnosis not present

## 2015-02-08 DIAGNOSIS — C7951 Secondary malignant neoplasm of bone: Secondary | ICD-10-CM | POA: Diagnosis not present

## 2015-02-08 DIAGNOSIS — M545 Low back pain: Secondary | ICD-10-CM

## 2015-02-08 MED ORDER — DEXAMETHASONE 4 MG PO TABS: 4.0000 mg | ORAL_TABLET | Freq: Two times a day (BID) | ORAL | Status: DC

## 2015-02-08 MED ORDER — DEXAMETHASONE 4 MG PO TABS: ORAL_TABLET | ORAL | Status: DC

## 2015-02-08 MED ORDER — HYDROCODONE-ACETAMINOPHEN 5-325 MG PO TABS: 1.0000 | ORAL_TABLET | Freq: Four times a day (QID) | ORAL | Status: DC | PRN

## 2015-02-08 MED ORDER — DEXAMETHASONE 4 MG PO TABS
4.0000 mg | ORAL_TABLET | Freq: Two times a day (BID) | ORAL | Status: DC
  Filled 2015-02-08 (×2): qty 1

## 2015-02-08 NOTE — Progress Notes (Signed)
   Department of Radiation Oncology  Phone:  [phone] Fax:        [phone]  Weekly Treatment Note    Name: Perry Nielsen Date: 02/08/2015 MRN: [id number] DOB: 1940/09/19   Current dose: 6 Gy  Current fraction:3   MEDICATIONS: Current Outpatient Prescriptions  Medication Sig Dispense Refill  . aspirin EC 81 MG tablet Take 81 mg by mouth daily.    Marland Kitchen dexamethasone (DECADRON) 4 MG tablet Take 1 tablet (4 mg total) by mouth 2 (two) times daily. 30 tablet 0  . fish oil-omega-3 fatty acids 1000 MG capsule Take 2 g by mouth daily.      . fluticasone (FLONASE) 50 MCG/ACT nasal spray Place 1 spray into both nostrils every evening.    Marland Kitchen glucosamine-chondroitin 500-400 MG tablet Take 2 tablets by mouth daily.     Marland Kitchen HYDROcodone-acetaminophen (NORCO/VICODIN) 5-325 MG per tablet Take 1-2 tablets by mouth every 6 (six) hours as needed for moderate pain. 30 tablet 0  . ibuprofen (ADVIL,MOTRIN) 200 MG tablet Take 400-800 mg by mouth every 6 (six) hours as needed (for pain.).    Marland Kitchen Multiple Vitamin (MULTIVITAMIN) tablet Take 1 tablet by mouth daily.      Marland Kitchen oxymetazoline (AFRIN) 0.05 % nasal spray Place 1 spray into both nostrils 2 (two) times daily as needed for congestion.    . psyllium (HYDROCIL/METAMUCIL) 95 % PACK Take 1 packet by mouth daily as needed for mild constipation or moderate constipation.     . sildenafil (VIAGRA) 100 MG tablet Take 0.5-1 tablets (50-100 mg total) by mouth daily as needed for erectile dysfunction. 10 tablet 10  . tetrahydrozoline 0.05 % ophthalmic solution Place 1-2 drops into both eyes 4 (four) times daily as needed (for eye irritation).    . traMADol (ULTRAM) 50 MG tablet Take 1 tablet by mouth 2 (two) times daily as needed for moderate pain.   0  . traZODone (DESYREL) 150 MG tablet Take 1 tablet (150 mg total) by mouth at bedtime. 90 tablet 3   No current facility-administered medications for this encounter.     ALLERGIES: Antihistamines,  diphenhydramine-type   LABORATORY DATA:  Lab Results  Component Value Date   WBC 6.4 02/03/2015   HGB 12.4* 02/03/2015   HCT 37.6* 02/03/2015   MCV 91.0 02/03/2015   PLT 130* 02/03/2015   Lab Results  Component Value Date   NA 139 02/03/2015   K 3.8 02/03/2015   CL 105 02/03/2015   CO2 26 02/03/2015   Lab Results  Component Value Date   ALT 16* 02/03/2015   AST 22 02/03/2015   ALKPHOS 59 02/03/2015   BILITOT 0.6 02/03/2015     NARRATIVE: Perry Nielsen was seen today for weekly treatment management. The chart was checked and the patient's films were reviewed.  Patient is doing well with radiation so far. No nausea. No diarrhea.  PHYSICAL EXAMINATION: alert, no acute distress; lying in hospital bed  ASSESSMENT: The patient is doing satisfactorily with treatment.   PLAN: We will continue with the patient's radiation treatment as planned. No difficulties so far. Additional studies in progress for pathology - clearly malignant tumor however.

## 2015-02-08 NOTE — Progress Notes (Signed)
IP PROGRESS NOTE  Subjective:   He appears unchanged. He wants to go home today.  Objective: Vital signs in last 24 hours: Blood pressure 136/75, pulse 57, temperature 97.5 F (36.4 C), temperature source Oral, resp. rate 18, height 6' (1.829 m), weight 165 lb (74.844 kg), SpO2 96 %.  Intake/Output from previous day: 08/04 0701 - 08/05 0700 In: 480 [P.O.:480] Out: 500 [Urine:500]  Physical Exam: Not performed today   Lab Results: No results for input(s): WBC, HGB, HCT, PLT in the last 72 hours.  BMET No results for input(s): NA, K, CL, CO2, GLUCOSE, BUN, CREATININE, CALCIUM in the last 72 hours.  Studies/Results: Ct Chest W Contrast  02/07/2015   CLINICAL DATA:  Two-month history of RIGHT hip and back pain radiating to RIGHT lower extremity, weight loss, weakness, RIGHT lateral chest discomfort and night sweats, past history basal cell skin cancer, recent abnormal MRI lumbar spine showing bony metastatic disease of L5, sacrum and ilii, question primary tumor  EXAM: CT CHEST, ABDOMEN, AND PELVIS WITH CONTRAST  TECHNIQUE: Multidetector CT imaging of the chest, abdomen and pelvis was performed following the standard protocol during bolus administration of intravenous contrast. Sagittal and coronal MPR images reconstructed from axial data set.  CONTRAST:  125m OMNIPAQUE IOHEXOL 300 MG/ML  SOLN IV.  COMPARISON:  None  FINDINGS: CT CHEST FINDINGS  Minimal scattered atherosclerotic calcification.  Minimal gynecomastia.  Vascular structures grossly patent on nondedicated exam.  No thoracic adenopathy.  Scattered mild interstitial prominence in the periphery of the lower lobes with minimal RIGHT basilar atelectasis.  Lungs otherwise clear.  No infiltrate, pleural effusion, pneumothorax or mass/ nodule.  Compression fracture of T7 with mild anterior height loss.  No additional osseous findings/ metastatic lesions.  CT ABDOMEN AND PELVIS FINDINGS  BILATERAL renal cysts.  Splenic mass 4.9 x 4.1 x 4.4  cm with minimal central necrosis question metastatic focus.  Remainder of liver, gallbladder, spleen, pancreas, kidneys and adrenal glands normal.  Mild scattered atherosclerotic changes with minimal aneurysmal dilatation of the distal abdominal aorta 3.1 x 2.5 cm image 84.  Normal appendix.  Bladder decompressed.  Unremarkable ureters.  Prostatic enlargement gland 6.1 x 3.2 cm image 119.  Stomach and bowel loops normal.  No additional mass, adenopathy, free air, free fluid or hernia.  Large destructive metastatic focus within RIGHT sacral ala 5.0 x 4.3 x 4.1 cm.  Subtle destructive focus within the superior central aspect of the L5 vertebral body.  No additional osseous lesions identified.  IMPRESSION: Mild T7 compression fracture, unable to exclude pathologic fracture.  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  Suspected splenic metastasis.  No definite primary tumor identified.  Prostatic enlargement.   Electronically Signed   By: MLavonia DanaM.D.   On: 02/07/2015 07:52   Ct Abdomen Pelvis W Contrast  02/07/2015   CLINICAL DATA:  Two-month history of RIGHT hip and back pain radiating to RIGHT lower extremity, weight loss, weakness, RIGHT lateral chest discomfort and night sweats, past history basal cell skin cancer, recent abnormal MRI lumbar spine showing bony metastatic disease of L5, sacrum and ilii, question primary tumor  EXAM: CT CHEST, ABDOMEN, AND PELVIS WITH CONTRAST  TECHNIQUE: Multidetector CT imaging of the chest, abdomen and pelvis was performed following the standard protocol during bolus administration of intravenous contrast. Sagittal and coronal MPR images reconstructed from axial data set.  CONTRAST:  108mOMNIPAQUE IOHEXOL 300 MG/ML  SOLN IV.  COMPARISON:  None  FINDINGS: CT CHEST FINDINGS  Minimal scattered atherosclerotic calcification.  Minimal gynecomastia.  Vascular structures grossly patent on nondedicated exam.  No thoracic adenopathy.  Scattered mild interstitial  prominence in the periphery of the lower lobes with minimal RIGHT basilar atelectasis.  Lungs otherwise clear.  No infiltrate, pleural effusion, pneumothorax or mass/ nodule.  Compression fracture of T7 with mild anterior height loss.  No additional osseous findings/ metastatic lesions.  CT ABDOMEN AND PELVIS FINDINGS  BILATERAL renal cysts.  Splenic mass 4.9 x 4.1 x 4.4 cm with minimal central necrosis question metastatic focus.  Remainder of liver, gallbladder, spleen, pancreas, kidneys and adrenal glands normal.  Mild scattered atherosclerotic changes with minimal aneurysmal dilatation of the distal abdominal aorta 3.1 x 2.5 cm image 84.  Normal appendix.  Bladder decompressed.  Unremarkable ureters.  Prostatic enlargement gland 6.1 x 3.2 cm image 119.  Stomach and bowel loops normal.  No additional mass, adenopathy, free air, free fluid or hernia.  Large destructive metastatic focus within RIGHT sacral ala 5.0 x 4.3 x 4.1 cm.  Subtle destructive focus within the superior central aspect of the L5 vertebral body.  No additional osseous lesions identified.  IMPRESSION: Mild T7 compression fracture, unable to exclude pathologic fracture.  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  Suspected splenic metastasis.  No definite primary tumor identified.  Prostatic enlargement.   Electronically Signed   By: Lavonia Dana M.D.   On: 02/07/2015 07:52    Medications: I have reviewed the patient's current medications.  Assessment/Plan:  1. Anemia 2. 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 Thrombocytopenia 4. T7 compression fracture-likely pathologic 5. Right foot drop-likely secondary to nerve compression from the right sacral mass  I reviewed the pathology slides from the 02/04/2015 biopsy with the hematocrit pathologist. 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 The pathologist requests additional material in order to establish a definitive diagnosis. I will schedule a bone marrow biopsy.  Recommendations: 1. Proceed with diagnostic bone marrow aspirate and biopsy-can be done today as an inpatient or as an outpatient next week 2. Decrease Decadron to 4 mg twice daily 3. Continue as needed analgesics for pain 4. Continue palliative radiation to the sacrum 5. Outpatient follow-up will be scheduled at the [facility] for next week   LOS: 5 days   [facility] [place], [place]  02/08/2015, 10:06 AM

## 2015-02-08 NOTE — Progress Notes (Signed)
MD saw patient and [place] table, not sent to nursing for assessment

## 2015-02-08 NOTE — Care Management Important Message (Signed)
Important Message  Patient Details  Name: Captain Blucher MRN: [id number] Date of Birth: 09-Nov-1940   Medicare Important Message Given:  Yes-third notification given    Camillo Flaming 02/08/2015, 12:34 [place] Message  Patient Details  Name: Chanoch Mccleery MRN: [id number] Date of Birth: [birth year]   Medicare Important Message Given:  Yes-third notification given    Camillo Flaming 02/08/2015, 12:34 PM

## 2015-02-08 NOTE — Progress Notes (Signed)
Physical Therapy Treatment Patient Details Name: Perry Nielsen MRN: [id number] DOB: 05/15/1941 Today's Date: 02/08/2015    History of Present Illness 74 y.o. 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    PT Comments    Patient is anxious to DC. Discussed HHPT eval for safety on stairs. Recommend HHPT eval as bed/bath on second floor. Patient may need to follow with [employer] if Footdrop persists.  Follow Up Recommendations  Home health PT (for safety eval.)     Equipment Recommendations  None recommended by PT    Recommendations for Other Services       Precautions / Restrictions Precautions Precautions: Fall Precaution Comments: due to R foot drop Required Braces or Orthoses: Other Brace/Splint Other Brace/Splint: PRAFO on R    Mobility  Bed Mobility Overal bed mobility: Independent                Transfers   Equipment used: Rolling walker (2 wheeled) Transfers: Sit to/from Stand Sit to Stand: Supervision         General transfer comment: verbal cues for hand placment  Ambulation/Gait Ambulation/Gait assistance: Supervision Ambulation Distance (Feet): 400 Feet Assistive device: Rolling walker (2 wheeled) Gait Pattern/deviations: Step-through pattern;Antalgic;Decreased stance time - right     General Gait Details: PRAFO on R foot, no LOB, supervision for safety, heavy reliance upon BUEs for support   Stairs Stairs: Yes Stairs assistance: Supervision Stair Management: Two rails;Step to pattern;Forwards Number of Stairs: 5 General stair comments: patient states that his Bed/bath are on second floor. suggested that he practice on steps entering home with a cane and rail before attempting  inside steps cautioned about carpets and PRAFO catching.  Wheelchair Mobility    Modified Rankin (Stroke Patients Only)       Balance                                     Cognition Arousal/Alertness: Awake/alert                          Exercises Other Exercises Other Exercises: gastroc stretch with sheet with 30 sec hold Other Exercises: supine hamstring stretch RLE Other Exercises: AAROM R ankle inversion/eversion, DF x 10 each    General Comments        Pertinent Vitals/Pain Pain Score: 3  Pain Location: R foot Pain Descriptors / Indicators: Tender Pain Intervention(s): Monitored during session;Premedicated before session    Home Living                      Prior Function            PT Goals (current goals can now be found in the care plan section) Progress towards PT goals: Progressing toward goals    Frequency  Min 3X/week    PT Plan Current plan remains appropriate;Discharge plan needs to be updated    Co-evaluation             End of Session Equipment Utilized During Treatment:  [facility] [place] Activity Tolerance: Patient tolerated treatment well Patient left: in bed;with call bell/phone within reach     Time: 0856-0930 PT Time Calculation (min) (ACUTE ONLY): 34 min  Charges:  $Gait Training: 23-37 mins  G Codes:      Claretha Cooper 02/08/2015, 1:08 PM Tresa Endo PT [phone]

## 2015-02-08 NOTE — Discharge Summary (Addendum)
Physician Discharge Summary  Perry Nielsen DJT:[id number] DOB: May 31, 1941 DOA: 02/03/2015  PCP: Joycelyn Man, MD  Admit date: 02/03/2015 Discharge date: 02/08/2015  Time spent: 35 minutes  Recommendations for Outpatient Follow-up:  1. Follow up with oncology as an outpatient  Discharge Diagnoses:  Principal Problem:   Spine metastasis Active Problems:   Allergic rhinitis   GERD   BPH associated with nocturia   Backache   Foot drop   Malnutrition of moderate degree   Discharge Condition: stable  Diet recommendation: regular  Filed Weights   02/03/15 1825  Weight: 74.844 kg (165 lb)    History of present illness:  74 y.o. male with a PMH of benign prostatic hyperplasia status post TURP who presents with a 2 month history of right hip pain and central back pain who presents to the hospital with worsening back pain. He thinks he has lost 12-14 pounds over the past few months, which he attributes to loss of appetite from pain  Hospital Course:  Spine metastasis/Backache/foot drop: - MRI as below, IR consulted for biopsy on 8.1.2016. - Started on  Decadron, will tapared as an outpatient. Consult oncology staining of bone marrow inconclusive. - Radio oncology consulted started radiation on 8.4.2016, which he will cont as an outpatient. - IR performed bone marrow on 8.5.2016, will follow up with results with oncology as an outpatient. - PT eval rec out patient. - Cont narcotics for pain.  Allergic rhinitis Cont flonase.  BPH associated with nocturia - PSA 4.2  GERD: - PPI.  Procedures:  MRI (i.e. Studies not automatically included, echos, thoracentesis, etc; not x-rays)  Consultations:  IR  Ocnology  Discharge Exam: Filed Vitals:   02/08/15 0539  BP: 136/75  Pulse: 57  Temp: 97.5 F (36.4 C)  Resp: 18    General: A&O x3 Cardiovascular: RRR Respiratory: good air movement CTA B/L  Discharge Instructions   Discharge Instructions    Diet -  low sodium heart healthy    Complete by:  As directed      Increase activity slowly    Complete by:  As directed           Current Discharge Medication List    START taking these medications   Details  dexamethasone (DECADRON) 4 MG tablet Take 1 tablet (4 mg total) by mouth 2 (two) times daily. Qty: 30 tablet, Refills: 0   Associated Diagnoses: Foot drop      CONTINUE these medications which have CHANGED   Details  HYDROcodone-acetaminophen (NORCO/VICODIN) 5-325 MG per tablet Take 1-2 tablets by mouth every 6 (six) hours as needed for moderate pain. Qty: 30 tablet, Refills: 0      CONTINUE these medications which have NOT CHANGED   Details  aspirin EC 81 MG tablet Take 81 mg by mouth daily.    fish oil-omega-3 fatty acids 1000 MG capsule Take 2 g by mouth daily.      fluticasone (FLONASE) 50 MCG/ACT nasal spray Place 1 spray into both nostrils every evening.    glucosamine-chondroitin 500-400 MG tablet Take 2 tablets by mouth daily.     ibuprofen (ADVIL,MOTRIN) 200 MG tablet Take 400-800 mg by mouth every 6 (six) hours as needed (for pain.).    Multiple Vitamin (MULTIVITAMIN) tablet Take 1 tablet by mouth daily.      oxymetazoline (AFRIN) 0.05 % nasal spray Place 1 spray into both nostrils 2 (two) times daily as needed for congestion.    psyllium (HYDROCIL/METAMUCIL) 95 % PACK Take 1  packet by mouth daily as needed for mild constipation or moderate constipation.     sildenafil (VIAGRA) 100 MG tablet Take 0.5-1 tablets (50-100 mg total) by mouth daily as needed for erectile dysfunction. Qty: 10 tablet, Refills: 10   Associated Diagnoses: Psychosexual dysfunction with inhibited sexual excitement    tetrahydrozoline 0.05 % ophthalmic solution Place 1-2 drops into both eyes 4 (four) times daily as needed (for eye irritation).    traMADol (ULTRAM) 50 MG tablet Take 1 tablet by mouth 2 (two) times daily as needed for moderate pain.  Refills: 0    traZODone (DESYREL) 150 MG  tablet Take 1 tablet (150 mg total) by mouth at bedtime. Qty: 90 tablet, Refills: 3       Allergies  Allergen Reactions  . Antihistamines, Diphenhydramine-Type     CAUSES PROSTATE TO SWELL   Follow-up Information    Follow up with Betsy Coder, MD In 1 week.   Specialty:  Oncology   Why:  hospital follow up   Contact information:   [place] [address] [phone]       Schedule an appointment as soon as possible for a visit with [place].   Specialty:  Radiology   Why:  Biopsy   Contact information:   [place] [id number] [place] [phone]       The results of significant diagnostics from this hospitalization (including imaging, microbiology, ancillary and laboratory) are listed below for reference.    Significant Diagnostic Studies: Dg Chest 2 View  02/03/2015   CLINICAL DATA:  Chest and back pain for 2 weeks. Prostate carcinoma.  EXAM: CHEST  2 VIEW  COMPARISON:  Thoracic spine radiographs on 01/15/2015  FINDINGS: The heart size and mediastinal contours are within normal limits. Both lungs are clear. No evidence of pneumothorax or pleural effusion.  Mild thoracic spine degenerative changes again noted. A moderate wedge compression fracture of a mid thoracic vertebral body is seen which appears new since recent exam. No focal sclerotic or lytic bone lesions visualized.  IMPRESSION: New moderate wedge compression fracture of mid thoracic vertebral body.  No active cardiopulmonary disease.   Electronically Signed   By: Earle Gell M.D.   On: 02/03/2015 16:27   Dg Thoracic Spine 2 View  01/15/2015   CLINICAL DATA:  No known injury. Increasing upper back pain for 72 hours.  EXAM: THORACIC SPINE - 2-3 VIEWS  COMPARISON:  None.  FINDINGS: Normal alignment of the thoracic spine. Diffuse degenerative change with narrowed thoracic interspaces and endplate hypertrophic  changes. No vertebral compression deformities. No focal bone lesion or bone destruction. No paraspinal soft tissue swelling.  IMPRESSION: Degenerative changes in the thoracic spine. Normal alignment. No acute displaced fractures identified.   Electronically Signed   By: Lucienne Capers M.D.   On: 01/15/2015 04:33   Ct Chest W Contrast  02/07/2015   CLINICAL DATA:  Two-month history of RIGHT hip and back pain radiating to RIGHT lower extremity, weight loss, weakness, RIGHT lateral chest discomfort and night sweats, past history basal cell skin cancer, recent abnormal MRI lumbar spine showing bony metastatic disease of L5, sacrum and ilii, question primary tumor  EXAM: CT CHEST, ABDOMEN, AND PELVIS WITH CONTRAST  TECHNIQUE: Multidetector CT imaging of the chest, abdomen and pelvis was performed following the standard protocol during bolus administration of intravenous contrast. Sagittal and coronal MPR images reconstructed from axial data set.  CONTRAST:  179mL OMNIPAQUE IOHEXOL 300  MG/ML  SOLN IV.  COMPARISON:  None  FINDINGS: CT CHEST FINDINGS  Minimal scattered atherosclerotic calcification.  Minimal gynecomastia.  Vascular structures grossly patent on nondedicated exam.  No thoracic adenopathy.  Scattered mild interstitial prominence in the periphery of the lower lobes with minimal RIGHT basilar atelectasis.  Lungs otherwise clear.  No infiltrate, pleural effusion, pneumothorax or mass/ nodule.  Compression fracture of T7 with mild anterior height loss.  No additional osseous findings/ metastatic lesions.  CT ABDOMEN AND PELVIS FINDINGS  BILATERAL renal cysts.  Splenic mass 4.9 x 4.1 x 4.4 cm with minimal central necrosis question metastatic focus.  Remainder of liver, gallbladder, spleen, pancreas, kidneys and adrenal glands normal.  Mild scattered atherosclerotic changes with minimal aneurysmal dilatation of the distal abdominal aorta 3.1 x 2.5 cm image 84.  Normal appendix.  Bladder decompressed.   Unremarkable ureters.  Prostatic enlargement gland 6.1 x 3.2 cm image 119.  Stomach and bowel loops normal.  No additional mass, adenopathy, free air, free fluid or hernia.  Large destructive metastatic focus within RIGHT sacral ala 5.0 x 4.3 x 4.1 cm.  Subtle destructive focus within the superior central aspect of the L5 vertebral body.  No additional osseous lesions identified.  IMPRESSION: Mild T7 compression fracture, unable to exclude pathologic fracture.  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  Suspected splenic metastasis.  No definite primary tumor identified.  Prostatic enlargement.   Electronically Signed   By: Lavonia Dana M.D.   On: 02/07/2015 07:52   Mr Lumbar Spine Wo Contrast  02/03/2015   CLINICAL DATA:  Foot drop. RIGHT leg pain extending to calf and toes. Personal history of prostate cancer. Chronic back pain with rapid worsening of symptoms recently.  EXAM: MRI LUMBAR SPINE WITHOUT CONTRAST  TECHNIQUE: Multiplanar, multisequence MR imaging of the lumbar spine was performed. No intravenous contrast was administered.  COMPARISON:  None.  FINDINGS: Segmentation: The numbering convention used for this exam termed L5-S1 as the last intervertebral disc space.  Alignment: Mild levoconvex curve of the lumbar spine. No spondylolisthesis.  Vertebrae: Bone marrow signal shows suppression of normal fatty marrow. This is a nonspecific finding most commonly associated with obesity, anemia, cigarette smoking or chronic disease.  Metastatic disease of the lumbar spine is also present. There is a large vertebral body metastasis in the anterior L5 vertebra without pathologic compression fracture. The other lumbar vertebrae show mild degenerative changes without bony metastatic disease.  There is also metastatic disease to the sacrum and iliac bones bilaterally. Additional coronal images of the sacrum and SI joints were performed which demonstrate metastatic disease. Although only partially  visible, some portions of the metastatic disease in the sacrum enhance however there is a large necrotic region in the medial RIGHT sacral ala. There is encroachment on the RIGHT S1 nerve in the neural foramen by the expanded RIGHT sacral ala.  Conus medullaris: Normal termination dorsal to L2. No intrathecal or dural metastases are present.  Paraspinal tissues: Bilateral renal cysts appear simple. Aortic ectasia and atherosclerosis. Maximal diameter the infrarenal abdominal aorta is 26 mm. Bilateral iliac artery ectasia is also present, with the LEFT measuring 16 mm.  Disc levels:  Disc Signal: Lower lumbar disc desiccation.  T12-L1:  Negative.  L1-L2: Mild disc degeneration but no stenosis.  L2-L3:  Negative.  L3-L4:  Disc desiccation but no stenosis.  L4-L5: Disc desiccation. Bilateral facet arthritis with mild periarticular edema and enhancement. Mild central stenosis associated with facet hypertrophy and ligamentum flavum redundancy.  Mild RIGHT foraminal stenosis associated with anterior facet spurring. This potentially affects the exiting RIGHT L4 nerve. The LEFT neural foramen appears adequately patent.  L5-S1: Disc desiccation and degeneration. Central canal and subarticular zones patent. Severe RIGHT facet arthrosis is present. Very mild RIGHT foraminal stenosis associated with endplate spurring and facet spurring.  IMPRESSION: 1. 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 RIGHT S1 foraminal narrowing associated with expansile metastasis of the RIGHT sacral ala. 2. Bilateral L4-L5 facet arthritis with RIGHT foraminal stenosis potentially affecting the RIGHT L4 nerve. 3. Mild L5-S1 RIGHT foraminal stenosis potentially affecting the exiting RIGHT L5 nerve.   Electronically Signed   By: Dereck Ligas M.D.   On: 02/03/2015 15:51   Ct Abdomen Pelvis W Contrast  02/07/2015   CLINICAL DATA:   Two-month history of RIGHT hip and back pain radiating to RIGHT lower extremity, weight loss, weakness, RIGHT lateral chest discomfort and night sweats, past history basal cell skin cancer, recent abnormal MRI lumbar spine showing bony metastatic disease of L5, sacrum and ilii, question primary tumor  EXAM: CT CHEST, ABDOMEN, AND PELVIS WITH CONTRAST  TECHNIQUE: Multidetector CT imaging of the chest, abdomen and pelvis was performed following the standard protocol during bolus administration of intravenous contrast. Sagittal and coronal MPR images reconstructed from axial data set.  CONTRAST:  136mL OMNIPAQUE IOHEXOL 300 MG/ML  SOLN IV.  COMPARISON:  None  FINDINGS: CT CHEST FINDINGS  Minimal scattered atherosclerotic calcification.  Minimal gynecomastia.  Vascular structures grossly patent on nondedicated exam.  No thoracic adenopathy.  Scattered mild interstitial prominence in the periphery of the lower lobes with minimal RIGHT basilar atelectasis.  Lungs otherwise clear.  No infiltrate, pleural effusion, pneumothorax or mass/ nodule.  Compression fracture of T7 with mild anterior height loss.  No additional osseous findings/ metastatic lesions.  CT ABDOMEN AND PELVIS FINDINGS  BILATERAL renal cysts.  Splenic mass 4.9 x 4.1 x 4.4 cm with minimal central necrosis question metastatic focus.  Remainder of liver, gallbladder, spleen, pancreas, kidneys and adrenal glands normal.  Mild scattered atherosclerotic changes with minimal aneurysmal dilatation of the distal abdominal aorta 3.1 x 2.5 cm image 84.  Normal appendix.  Bladder decompressed.  Unremarkable ureters.  Prostatic enlargement gland 6.1 x 3.2 cm image 119.  Stomach and bowel loops normal.  No additional mass, adenopathy, free air, free fluid or hernia.  Large destructive metastatic focus within RIGHT sacral ala 5.0 x 4.3 x 4.1 cm.  Subtle destructive focus within the superior central aspect of the L5 vertebral body.  No additional osseous lesions  identified.  IMPRESSION: Mild T7 compression fracture, unable to exclude pathologic fracture.  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  Suspected splenic metastasis.  No definite primary tumor identified.  Prostatic enlargement.   Electronically Signed   By: Lavonia Dana M.D.   On: 02/07/2015 07:52   Ct Biopsy  02/04/2015   CLINICAL DATA:  Large right sacral mass compatible with metastatic disease. History of prostate cancer.  EXAM: CT GUIDED CORE BIOPSY OF LARGE RIGHT SACRAL SOFT TISSUE MASS  ANESTHESIA/SEDATION: 1.0  Mg IV Versed; 50 mcg IV Fentanyl  Total Moderate Sedation Time: 5 minutes.  PROCEDURE: The procedure risks, benefits, and alternatives were explained to the patient. Questions regarding the procedure were encouraged and answered. The patient understands and consents to the procedure.  The posterior right sacral region was prepped with  Betadinein a sterile fashion, and a sterile drape was applied covering the operative field. A sterile gown and sterile gloves were used for the procedure. Local anesthesia was provided with 1% Lidocaine.  Previous imaging reviewed. Patient positioned prone. Noncontrast localization CT performed. The large right sacral destructive soft tissue mass was localized. Under sterile conditions and local anesthesia, a 17 gauge 6.8 cm access needle was advanced from a posterior approach to the mass. Needle position confirmed with CT. 18 gauge core biopsies obtained. Needle removed.  Complications: None immediate  FINDINGS: Imaging confirms needle access of the posterior right sacral mass for core biopsy  IMPRESSION: Successful CT-guided right sacral soft tissue mass 18 gauge core biopsy   Electronically Signed   By: Jerilynn Mages.  Shick M.D.   On: 02/04/2015 15:37    Microbiology: No results found for this or any previous visit (from the past 240 hour(s)).   Labs: Basic Metabolic Panel:  Recent Labs Lab 02/03/15 1445 02/03/15 1540  NA  --  139  K  --  3.8  CL   --  105  CO2  --  26  GLUCOSE  --  90  BUN  --  13  CREATININE 0.90 0.87  CALCIUM  --  8.6*   Liver Function Tests:  Recent Labs Lab 02/03/15 1540  AST 22  ALT 16*  ALKPHOS 59  BILITOT 0.6  PROT 5.8*  ALBUMIN 3.0*   No results for input(s): LIPASE, AMYLASE in the last 168 hours. No results for input(s): AMMONIA in the last 168 hours. CBC:  Recent Labs Lab 02/03/15 1540  WBC 6.4  NEUTROABS 4.6  HGB 12.4*  HCT 37.6*  MCV 91.0  PLT 130*   Cardiac Enzymes: No results for input(s): CKTOTAL, CKMB, CKMBINDEX, TROPONINI in the last 168 hours. BNP: BNP (last 3 results) No results for input(s): BNP in the last 8760 hours.  ProBNP (last 3 results) No results for input(s): PROBNP in the last 8760 hours.  CBG: No results for input(s): GLUCAP in the last 168 hours.     Signed:  Charlynne Cousins  [facility] 02/08/2015, 12:10 PM

## 2015-02-08 NOTE — Telephone Encounter (Signed)
hosp f/u-s/w Manuela Schwartz on [facility] and gave hosp f/u for 08/10 @ 11 w/Dr. Benay Spice

## 2015-02-09 LAB — BETA 2 MICROGLOBULIN, SERUM: Beta-2 Microglobulin: 1.7 mg/L (ref 0.6–2.4)

## 2015-02-10 LAB — KAPPA/LAMBDA LIGHT CHAINS
KAPPA FREE LGHT CHN: 7.09 mg/L (ref 3.30–19.40)
Kappa, lambda light chain ratio: 3.36 — ABNORMAL HIGH (ref 0.26–1.65)
LAMDA FREE LIGHT CHAINS: 2.11 mg/L — AB (ref 5.71–26.30)

## 2015-02-11 ENCOUNTER — Other Ambulatory Visit: Payer: Self-pay | Admitting: Radiology

## 2015-02-11 ENCOUNTER — Ambulatory Visit
Admission: RE | Admit: 2015-02-11 | Discharge: 2015-02-11 | Disposition: A | Discharge status: Home / Self Care | Payer: Medicare Other | Source: Ambulatory Visit | Attending: Radiation Oncology | Admitting: Radiation Oncology

## 2015-02-11 ENCOUNTER — Other Ambulatory Visit: Payer: Self-pay | Admitting: Oncology

## 2015-02-11 DIAGNOSIS — Z510 Encounter for antineoplastic radiation therapy: Secondary | ICD-10-CM | POA: Diagnosis not present

## 2015-02-11 DIAGNOSIS — C412 Malignant neoplasm of vertebral column: Secondary | ICD-10-CM | POA: Diagnosis not present

## 2015-02-11 DIAGNOSIS — C7951 Secondary malignant neoplasm of bone: Secondary | ICD-10-CM | POA: Diagnosis not present

## 2015-02-11 DIAGNOSIS — M549 Dorsalgia, unspecified: Secondary | ICD-10-CM | POA: Diagnosis not present

## 2015-02-11 DIAGNOSIS — N400 Enlarged prostate without lower urinary tract symptoms: Secondary | ICD-10-CM | POA: Diagnosis not present

## 2015-02-11 DIAGNOSIS — M21371 Foot drop, right foot: Secondary | ICD-10-CM | POA: Diagnosis not present

## 2015-02-11 DIAGNOSIS — K219 Gastro-esophageal reflux disease without esophagitis: Secondary | ICD-10-CM | POA: Diagnosis not present

## 2015-02-11 DIAGNOSIS — D649 Anemia, unspecified: Secondary | ICD-10-CM

## 2015-02-11 DIAGNOSIS — C801 Malignant (primary) neoplasm, unspecified: Secondary | ICD-10-CM | POA: Diagnosis not present

## 2015-02-11 DIAGNOSIS — E440 Moderate protein-calorie malnutrition: Secondary | ICD-10-CM | POA: Diagnosis not present

## 2015-02-11 LAB — PROTEIN ELECTROPHORESIS, SERUM
A/G Ratio: 1 (ref 0.7–1.7)
ALBUMIN ELP: 2.8 g/dL — AB (ref 2.9–4.4)
Alpha-1-Globulin: 0.3 g/dL (ref 0.0–0.4)
Alpha-2-Globulin: 1 g/dL (ref 0.4–1.0)
BETA GLOBULIN: 0.9 g/dL (ref 0.7–1.3)
GLOBULIN, TOTAL: 2.9 g/dL (ref 2.2–3.9)
Gamma Globulin: 0.7 g/dL (ref 0.4–1.8)
Total Protein ELP: 5.7 g/dL — ABNORMAL LOW (ref 6.0–8.5)

## 2015-02-11 MED ORDER — OXYCODONE HCL 5 MG PO TABS: 5.0000 mg | ORAL_TABLET | ORAL | Status: DC | PRN

## 2015-02-11 NOTE — Progress Notes (Signed)
patient came to nursing c/o pain in right foot from right hip, taking  Took 2  Hydrocodone 5/325mg  tabs at 1100, pain effective for 2 hours, then  Also took tramadol anly lasted 1 hour relief,wants something stronger, woke up at 430 am and until 9am  Pain, now a 5/10 sclqe, pt eduction done, radiation therapy and you book,discussed ways to manage sacrum side effects,,nause, vomiting, diarrhea, bladder changes,  Fatigue,pain,, increase protein in diet, low fiber diet if diarrhea occurs and imodium prn, sitz bath if needed, patient stated "its the nerve pain in my foot from my bottom, its increasing  Right now  can't live like this",  Needs something stronger for pain BP 109/67 mmHg  Pulse 68  Temp(Src) 98 F (36.7 C) (Oral)  Resp 20  Wt Readings from Last 3 Encounters:  02/03/15 165 lb (74.844 kg)  01/15/15 175 lb (79.379 kg)  01/11/15 173 lb (78.472 kg)

## 2015-02-11 NOTE — Progress Notes (Signed)
  Radiation Oncology         [phone]   Name: Perry Nielsen MRN: [id number]   Date: 02/11/2015  DOB: 12-30-1940   Weekly Radiation Therapy Management    ICD-9-CM ICD-10-CM   1. Spine metastasis 198.5 C79.51 oxyCODONE (OXY IR/ROXICODONE) 5 MG immediate release tablet    Current Dose: 9 Gy  Planned Dose:  30 Gy  Narrative The patient presents for routine under treatment assessment.   Patient came to nursing c/o pain in right foot from right hip, taking  Took 2  Hydrocodone 5/325mg  tabs at 1100, pain effective for 2 hours, then  Also took tramadol anly lasted 1 hour relief,wants something stronger, woke up at 430 am and until 9am  Pain, now a 5/10 scale, pt eduction done, radiation therapy and you book,discussed ways to manage sacrum side effects, nausea, vomiting, diarrhea, bladder changes,  Fatigue,pain, increase protein in diet, low fiber diet if diarrhea occurs and imodium prn, sitz bath if needed, patient stated "its the nerve pain in my foot from my bottom, its increasing  Right now can't live like this",  Needs something stronger for pain.  Set-up films were reviewed. The chart was checked.  Physical Findings . Weight decreased.  No significant changes.  Impression The patient is tolerating radiation. Patient has significant right lower extremity pain. Current pain medications are not effective in alleviating his pain.  Plan Continue treatment as planned. I have prescribed Oxycodone. I advised the patient to continue taking steroids.    Sheral Apley Tammi Klippel, M.D.   This document serves as a record of services personally performed by Tyler Pita, MD. It was created on his behalf by Derek Mound, a trained medical scribe. The creation of this record is based on the scribe's personal observations and the provider's statements to them. This document has been checked and approved by the attending provider.

## 2015-02-12 ENCOUNTER — Ambulatory Visit: Payer: Medicare Other

## 2015-02-12 ENCOUNTER — Other Ambulatory Visit (HOSPITAL_COMMUNITY): Payer: Medicare Other

## 2015-02-12 ENCOUNTER — Ambulatory Visit
Admission: RE | Admit: 2015-02-12 | Discharge: 2015-02-12 | Disposition: A | Discharge status: Home / Self Care | Payer: Medicare Other | Source: Ambulatory Visit | Attending: Radiation Oncology | Admitting: Radiation Oncology

## 2015-02-12 ENCOUNTER — Ambulatory Visit (HOSPITAL_COMMUNITY)
Admission: RE | Admit: 2015-02-12 | Discharge: 2015-02-12 | Disposition: A | Discharge status: Home / Self Care | Payer: Medicare Other | Source: Ambulatory Visit | Attending: Oncology | Admitting: Oncology

## 2015-02-12 ENCOUNTER — Encounter (HOSPITAL_COMMUNITY): Payer: Self-pay

## 2015-02-12 ENCOUNTER — Telehealth: Payer: Self-pay | Admitting: Oncology

## 2015-02-12 ENCOUNTER — Other Ambulatory Visit: Payer: Self-pay | Admitting: *Deleted

## 2015-02-12 DIAGNOSIS — M549 Dorsalgia, unspecified: Secondary | ICD-10-CM | POA: Diagnosis not present

## 2015-02-12 DIAGNOSIS — M899 Disorder of bone, unspecified: Secondary | ICD-10-CM | POA: Diagnosis not present

## 2015-02-12 DIAGNOSIS — M21371 Foot drop, right foot: Secondary | ICD-10-CM | POA: Diagnosis not present

## 2015-02-12 DIAGNOSIS — Z79899 Other long term (current) drug therapy: Secondary | ICD-10-CM | POA: Diagnosis not present

## 2015-02-12 DIAGNOSIS — Z85828 Personal history of other malignant neoplasm of skin: Secondary | ICD-10-CM | POA: Insufficient documentation

## 2015-02-12 DIAGNOSIS — Z510 Encounter for antineoplastic radiation therapy: Secondary | ICD-10-CM | POA: Diagnosis not present

## 2015-02-12 DIAGNOSIS — C412 Malignant neoplasm of vertebral column: Secondary | ICD-10-CM | POA: Diagnosis not present

## 2015-02-12 DIAGNOSIS — C801 Malignant (primary) neoplasm, unspecified: Secondary | ICD-10-CM | POA: Diagnosis not present

## 2015-02-12 DIAGNOSIS — D649 Anemia, unspecified: Secondary | ICD-10-CM

## 2015-02-12 DIAGNOSIS — M898X8 Other specified disorders of bone, other site: Secondary | ICD-10-CM | POA: Diagnosis not present

## 2015-02-12 DIAGNOSIS — Z7982 Long term (current) use of aspirin: Secondary | ICD-10-CM | POA: Diagnosis not present

## 2015-02-12 DIAGNOSIS — D7589 Other specified diseases of blood and blood-forming organs: Secondary | ICD-10-CM | POA: Diagnosis not present

## 2015-02-12 DIAGNOSIS — C7951 Secondary malignant neoplasm of bone: Secondary | ICD-10-CM | POA: Diagnosis not present

## 2015-02-12 DIAGNOSIS — K219 Gastro-esophageal reflux disease without esophagitis: Secondary | ICD-10-CM | POA: Insufficient documentation

## 2015-02-12 DIAGNOSIS — R897 Abnormal histological findings in specimens from other organs, systems and tissues: Secondary | ICD-10-CM | POA: Diagnosis not present

## 2015-02-12 DIAGNOSIS — N400 Enlarged prostate without lower urinary tract symptoms: Secondary | ICD-10-CM | POA: Diagnosis not present

## 2015-02-12 DIAGNOSIS — E440 Moderate protein-calorie malnutrition: Secondary | ICD-10-CM | POA: Diagnosis not present

## 2015-02-12 LAB — PROTIME-INR
INR: 1.09 (ref 0.00–1.49)
Prothrombin Time: 14.3 seconds (ref 11.6–15.2)

## 2015-02-12 LAB — CBC
HEMATOCRIT: 43.5 % (ref 39.0–52.0)
Hemoglobin: 14.8 g/dL (ref 13.0–17.0)
MCH: 30.5 pg (ref 26.0–34.0)
MCHC: 34 g/dL (ref 30.0–36.0)
MCV: 89.5 fL (ref 78.0–100.0)
PLATELETS: 160 10*3/uL (ref 150–400)
RBC: 4.86 MIL/uL (ref 4.22–5.81)
RDW: 13.1 % (ref 11.5–15.5)
WBC: 5.6 10*3/uL (ref 4.0–10.5)

## 2015-02-12 LAB — IMMUNOFIXATION ELECTROPHORESIS
IGA: 138 mg/dL (ref 61–437)
IGM, SERUM: 39 mg/dL (ref 15–143)
IgG (Immunoglobin G), Serum: 687 mg/dL — ABNORMAL LOW (ref 700–1600)
TOTAL PROTEIN ELP: 5.5 g/dL — AB (ref 6.0–8.5)

## 2015-02-12 LAB — BONE MARROW EXAM

## 2015-02-12 LAB — APTT: APTT: 25 s (ref 24–37)

## 2015-02-12 MED ORDER — FENTANYL CITRATE (PF) 100 MCG/2ML IJ SOLN
INTRAMUSCULAR | Status: AC | PRN
  Administered 2015-02-12: 50 ug via INTRAVENOUS

## 2015-02-12 MED ORDER — SODIUM CHLORIDE 0.9 % IV SOLN
INTRAVENOUS | Status: DC
  Administered 2015-02-12: 07:00:00 via INTRAVENOUS

## 2015-02-12 MED ORDER — FENTANYL CITRATE (PF) 100 MCG/2ML IJ SOLN
INTRAMUSCULAR | Status: AC
  Filled 2015-02-12: qty 4

## 2015-02-12 MED ORDER — HYDROCODONE-ACETAMINOPHEN 5-325 MG PO TABS
1.0000 | ORAL_TABLET | ORAL | Status: DC | PRN
  Filled 2015-02-12: qty 2

## 2015-02-12 MED ORDER — MIDAZOLAM HCL 2 MG/2ML IJ SOLN
INTRAMUSCULAR | Status: AC | PRN
  Administered 2015-02-12: 1 mg via INTRAVENOUS
  Administered 2015-02-12: 0.5 mg via INTRAVENOUS

## 2015-02-12 MED ORDER — MIDAZOLAM HCL 2 MG/2ML IJ SOLN
INTRAMUSCULAR | Status: AC
  Filled 2015-02-12: qty 6

## 2015-02-12 NOTE — Procedures (Signed)
L iliac BM aspirate and 11 g core No comp/EBL

## 2015-02-12 NOTE — Consult Note (Signed)
Chief Complaint: Patient was seen in consultation today for No chief complaint on file.  at the request of Sherrill,Gary B  Referring Physician(s): Sherrill,Gary B  History of Present Illness: Perry Nielsen is a 74 y.o. 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 He is referred for a BM biopsy. He continues to have RLE radiculopathy.  Past Medical History  Diagnosis Date  . 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 ALLERGIC RHINITIS 08/03/2007  . GERD 02/18/2007  . BENIGN PROSTATIC HYPERTROPHY 02/18/2007  . KNEE PAIN, LEFT, CHRONIC 08/07/2008  . BACK PAIN 09/18/2009  . HAMMER TOE 08/03/2007  . 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 CAROTID BRUIT, LEFT 08/07/2008  . Abdominal pain, right upper quadrant 09/18/2009  . Skin cancer   . Plantar fasciitis of right foot 03/24/2012  . BPH associated with nocturia 02/18/2007    Qualifier: Diagnosis of  By: Tiney Rouge CMA, Brent.P[place]ake      Past Surgical History  Procedure Laterality Date  . Shoulder open rotator cuff repair  07/06/05    right  . Knee arthroscopy      left  . Tonsillectomy and adenoidectomy    . Transurethral resection of prostate    . Cataract extraction w/ intraocular lens  implant, bilateral      Allergies: Antihistamines, diphenhydramine-type  Medications: Prior to Admission medications   Medication Sig Start Date End Date Taking? Authorizing Provider  aspirin EC 81 MG tablet Take 81 mg by mouth daily.   Yes Historical Provider, MD  dexamethasone (DECADRON) 4 MG tablet Take 1 tablet (4 mg total) by mouth 2 (two) times daily. 02/08/15  Yes Charlynne Cousins, MD  fish oil-omega-3 fatty acids 1000 MG capsule Take 2 g by mouth daily.     Yes Historical Provider, MD  fluticasone (FLONASE) 50 MCG/ACT nasal spray Place 1 spray into both nostrils every evening.   Yes Historical Provider, MD  glucosamine-chondroitin 500-400 MG tablet Take 2 tablets by mouth daily.    Yes Historical Provider, MD    HYDROcodone-acetaminophen (NORCO/VICODIN) 5-325 MG per tablet Take 1-2 tablets by mouth every 6 (six) hours as needed for moderate pain. 02/08/15  Yes Charlynne Cousins, MD  ibuprofen (ADVIL,MOTRIN) 200 MG tablet Take 400-800 mg by mouth every 6 (six) hours as needed (for pain.).   Yes Historical Provider, MD  Multiple Vitamin (MULTIVITAMIN) tablet Take 1 tablet by mouth daily.     Yes Historical Provider, MD  oxyCODONE (OXY IR/ROXICODONE) 5 MG immediate release tablet Take 1-3 tablets (5-15 mg total) by mouth every 4 (four) hours as needed for severe pain. 02/11/15  Yes Tyler Pita, MD  oxymetazoline (AFRIN) 0.05 % nasal spray Place 1 spray into both nostrils 2 (two) times daily as needed for congestion.   Yes Historical Provider, MD  psyllium (HYDROCIL/METAMUCIL) 95 % PACK Take 1 packet by mouth daily as needed for mild constipation or moderate constipation.    Yes Historical Provider, MD  sildenafil (VIAGRA) 100 MG tablet Take 0.5-1 tablets (50-100 mg total) by mouth daily as needed for erectile dysfunction. 10/23/14  Yes Dorena Cookey, MD  tetrahydrozoline 0.05 % ophthalmic solution Place 1-2 drops into both eyes 4 (four) times daily as needed (for eye irritation).   Yes Historical Provider, MD  traMADol (ULTRAM) 50 MG tablet Take 50 mg by mouth 2 (two) times daily as needed for moderate pain (sleep).  01/24/15  Yes Historical Provider, MD  traZODone (DESYREL) 150 MG tablet Take  1 tablet (150 mg total) by mouth at bedtime. 10/23/14  Yes Dorena Cookey, MD     Family History  Problem Relation Age of Onset  . Heart attack Other   . Aneurysm Other     aortic  . Coronary artery disease Sister   . Cancer Father     prostate  . Pulmonary fibrosis Mother   . Uterine cancer Mother     History   Social History  . Marital Status: Married    Spouse Name: N/A  . Number of Children: N/A  . Years of Education: N/A   Social History Main Topics  . Smoking status: Never Smoker   . Smokeless  tobacco: Not on file  . Alcohol Use: 0.0 oz/week    0 Standard drinks or equivalent per week     Comment: 2-3 times a week. Beer.   . Drug Use: No  . Sexual Activity: Not on file   Other Topics Concern  . None   Social History Narrative   Married.  Very active.      ECOG Status: 1 - Symptomatic but completely ambulatory  Review of Systems: A 12 point ROS discussed and pertinent positives are indicated in the HPI above.  All other systems are negative.  Review of Systems  Vital Signs: BP 131/73 mmHg  Pulse 53  Temp(Src) 97.7 F (36.5 C) (Oral)  Resp 16  Ht 6' (1.829 m)  Wt 162 lb (73.483 kg)  BMI 21.97 kg/m2  SpO2 100%  Physical Exam  Constitutional: He is oriented to person, place, and time. He appears well-developed and well-nourished.  Neck: Normal range of motion. Neck supple.  Cardiovascular: Normal rate and regular rhythm.   Pulmonary/Chest: Effort normal and breath sounds normal.  Neurological: He is alert and oriented to person, place, and time.    Mallampati Score: 1    Imaging: Dg Chest 2 View  02/03/2015   CLINICAL DATA:  Chest and back pain for 2 weeks. Prostate carcinoma.  EXAM: CHEST  2 VIEW  COMPARISON:  Thoracic spine radiographs on 01/15/2015  FINDINGS: The heart size and mediastinal contours are within normal limits. Both lungs are clear. No evidence of pneumothorax or pleural effusion.  Mild thoracic spine degenerative changes again noted. A moderate wedge compression fracture of a mid thoracic vertebral body is seen which appears new since recent exam. No focal sclerotic or lytic bone lesions visualized.  IMPRESSION: New moderate wedge compression fracture of mid thoracic vertebral body.  No active cardiopulmonary disease.   Electronically Signed   By: Earle Gell M.D.   On: 02/03/2015 16:27   Dg Thoracic Spine 2 View  01/15/2015   CLINICAL DATA:  No known injury. Increasing upper back pain for 72 hours.  EXAM: THORACIC SPINE - 2-3 VIEWS   COMPARISON:  None.  FINDINGS: Normal alignment of the thoracic spine. Diffuse degenerative change with narrowed thoracic interspaces and endplate hypertrophic changes. No vertebral compression deformities. No focal bone lesion or bone destruction. No paraspinal soft tissue swelling.  IMPRESSION: Degenerative changes in the thoracic spine. Normal alignment. No acute displaced fractures identified.   Electronically Signed   By: Lucienne Capers M.D.   On: 01/15/2015 04:33   Ct Chest W Contrast  02/07/2015   CLINICAL DATA:  Two-month history of RIGHT hip and back pain radiating to RIGHT lower extremity, weight loss, weakness, RIGHT lateral chest discomfort and night sweats, past history basal cell skin cancer, recent abnormal MRI lumbar spine showing bony metastatic  disease of L5, sacrum and ilii, question primary tumor  EXAM: CT CHEST, ABDOMEN, AND PELVIS WITH CONTRAST  TECHNIQUE: Multidetector CT imaging of the chest, abdomen and pelvis was performed following the standard protocol during bolus administration of intravenous contrast. Sagittal and coronal MPR images reconstructed from axial data set.  CONTRAST:  137m OMNIPAQUE IOHEXOL 300 MG/ML  SOLN IV.  COMPARISON:  None  FINDINGS: CT CHEST FINDINGS  Minimal scattered atherosclerotic calcification.  Minimal gynecomastia.  Vascular structures grossly patent on nondedicated exam.  No thoracic adenopathy.  Scattered mild interstitial prominence in the periphery of the lower lobes with minimal RIGHT basilar atelectasis.  Lungs otherwise clear.  No infiltrate, pleural effusion, pneumothorax or mass/ nodule.  Compression fracture of T7 with mild anterior height loss.  No additional osseous findings/ metastatic lesions.  CT ABDOMEN AND PELVIS FINDINGS  BILATERAL renal cysts.  Splenic mass 4.9 x 4.1 x 4.4 cm with minimal central necrosis question metastatic focus.  Remainder of liver, gallbladder, spleen, pancreas, kidneys and adrenal glands normal.  Mild scattered  atherosclerotic changes with minimal aneurysmal dilatation of the distal abdominal aorta 3.1 x 2.5 cm image 84.  Normal appendix.  Bladder decompressed.  Unremarkable ureters.  Prostatic enlargement gland 6.1 x 3.2 cm image 119.  Stomach and bowel loops normal.  No additional mass, adenopathy, free air, free fluid or hernia.  Large destructive metastatic focus within RIGHT sacral ala 5.0 x 4.3 x 4.1 cm.  Subtle destructive focus within the superior central aspect of the L5 vertebral body.  No additional osseous lesions identified.  IMPRESSION: Mild T7 compression fracture, unable to exclude pathologic fracture.  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  Suspected splenic metastasis.  No definite primary tumor identified.  Prostatic enlargement.   Electronically Signed   By: MLavonia DanaM.D.   On: 02/07/2015 07:52   Mr Lumbar Spine Wo Contrast  02/03/2015   CLINICAL DATA:  Foot drop. RIGHT leg pain extending to calf and toes. Personal history of prostate cancer. Chronic back pain with rapid worsening of symptoms recently.  EXAM: MRI LUMBAR SPINE WITHOUT CONTRAST  TECHNIQUE: Multiplanar, multisequence MR imaging of the lumbar spine was performed. No intravenous contrast was administered.  COMPARISON:  None.  FINDINGS: Segmentation: The numbering convention used for this exam termed L5-S1 as the last intervertebral disc space.  Alignment: Mild levoconvex curve of the lumbar spine. No spondylolisthesis.  Vertebrae: Bone marrow signal shows suppression of normal fatty marrow. This is a nonspecific finding most commonly associated with obesity, anemia, cigarette smoking or chronic disease.  Metastatic disease of the lumbar spine is also present. There is a large vertebral body metastasis in the anterior L5 vertebra without pathologic compression fracture. The other lumbar vertebrae show mild degenerative changes without bony metastatic disease.  There is also metastatic disease to the sacrum and iliac  bones bilaterally. Additional coronal images of the sacrum and SI joints were performed which demonstrate metastatic disease. Although only partially visible, some portions of the metastatic disease in the sacrum enhance however there is a large necrotic region in the medial RIGHT sacral ala. There is encroachment on the RIGHT S1 nerve in the neural foramen by the expanded RIGHT sacral ala.  Conus medullaris: Normal termination dorsal to L2. No intrathecal or dural metastases are present.  Paraspinal tissues: Bilateral renal cysts appear simple. Aortic ectasia and atherosclerosis. Maximal diameter the infrarenal abdominal aorta is 26 mm. Bilateral iliac artery ectasia is also present, with the LEFT measuring 16 mm.  Disc levels:  Disc Signal: Lower lumbar disc desiccation.  T12-L1:  Negative.  L1-L2: Mild disc degeneration but no stenosis.  L2-L3:  Negative.  L3-L4:  Disc desiccation but no stenosis.  L4-L5: Disc desiccation. Bilateral facet arthritis with mild periarticular edema and enhancement. Mild central stenosis associated with facet hypertrophy and ligamentum flavum redundancy. Mild RIGHT foraminal stenosis associated with anterior facet spurring. This potentially affects the exiting RIGHT L4 nerve. The LEFT neural foramen appears adequately patent.  L5-S1: Disc desiccation and degeneration. Central canal and subarticular zones patent. Severe RIGHT facet arthrosis is present. Very mild RIGHT foraminal stenosis associated with endplate spurring and facet spurring.  IMPRESSION: 1. 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 RIGHT S1 foraminal narrowing associated with expansile metastasis of the RIGHT sacral ala. 2. Bilateral L4-L5 facet arthritis with RIGHT foraminal stenosis potentially affecting the RIGHT L4 nerve. 3. Mild L5-S1 RIGHT foraminal stenosis potentially affecting the exiting RIGHT L5  nerve.   Electronically Signed   By: Dereck Ligas M.D.   On: 02/03/2015 15:51   Ct Abdomen Pelvis W Contrast  02/07/2015   CLINICAL DATA:  Two-month history of RIGHT hip and back pain radiating to RIGHT lower extremity, weight loss, weakness, RIGHT lateral chest discomfort and night sweats, past history basal cell skin cancer, recent abnormal MRI lumbar spine showing bony metastatic disease of L5, sacrum and ilii, question primary tumor  EXAM: CT CHEST, ABDOMEN, AND PELVIS WITH CONTRAST  TECHNIQUE: Multidetector CT imaging of the chest, abdomen and pelvis was performed following the standard protocol during bolus administration of intravenous contrast. Sagittal and coronal MPR images reconstructed from axial data set.  CONTRAST:  165m OMNIPAQUE IOHEXOL 300 MG/ML  SOLN IV.  COMPARISON:  None  FINDINGS: CT CHEST FINDINGS  Minimal scattered atherosclerotic calcification.  Minimal gynecomastia.  Vascular structures grossly patent on nondedicated exam.  No thoracic adenopathy.  Scattered mild interstitial prominence in the periphery of the lower lobes with minimal RIGHT basilar atelectasis.  Lungs otherwise clear.  No infiltrate, pleural effusion, pneumothorax or mass/ nodule.  Compression fracture of T7 with mild anterior height loss.  No additional osseous findings/ metastatic lesions.  CT ABDOMEN AND PELVIS FINDINGS  BILATERAL renal cysts.  Splenic mass 4.9 x 4.1 x 4.4 cm with minimal central necrosis question metastatic focus.  Remainder of liver, gallbladder, spleen, pancreas, kidneys and adrenal glands normal.  Mild scattered atherosclerotic changes with minimal aneurysmal dilatation of the distal abdominal aorta 3.1 x 2.5 cm image 84.  Normal appendix.  Bladder decompressed.  Unremarkable ureters.  Prostatic enlargement gland 6.1 x 3.2 cm image 119.  Stomach and bowel loops normal.  No additional mass, adenopathy, free air, free fluid or hernia.  Large destructive metastatic focus within RIGHT sacral ala 5.0  x 4.3 x 4.1 cm.  Subtle destructive focus within the superior central aspect of the L5 vertebral body.  No additional osseous lesions identified.  IMPRESSION: Mild T7 compression fracture, unable to exclude pathologic fracture.  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  Suspected splenic metastasis.  No definite primary tumor identified.  Prostatic enlargement.   Electronically Signed   By: MLavonia DanaM.D.   On: 02/07/2015 07:52   Ct Biopsy  02/04/2015   CLINICAL DATA:  Large right sacral mass compatible with metastatic disease. History of prostate cancer.  EXAM: CT GUIDED CORE BIOPSY OF LARGE RIGHT SACRAL SOFT TISSUE  MASS  ANESTHESIA/SEDATION: 1.0  Mg IV Versed; 50 mcg IV Fentanyl  Total Moderate Sedation Time: 5 minutes.  PROCEDURE: The procedure risks, benefits, and alternatives were explained to the patient. Questions regarding the procedure were encouraged and answered. The patient understands and consents to the procedure.  The posterior right sacral region was prepped with Betadinein a sterile fashion, and a sterile drape was applied covering the operative field. A sterile gown and sterile gloves were used for the procedure. Local anesthesia was provided with 1% Lidocaine.  Previous imaging reviewed. Patient positioned prone. Noncontrast localization CT performed. The large right sacral destructive soft tissue mass was localized. Under sterile conditions and local anesthesia, a 17 gauge 6.8 cm access needle was advanced from a posterior approach to the mass. Needle position confirmed with CT. 18 gauge core biopsies obtained. Needle removed.  Complications: None immediate  FINDINGS: Imaging confirms needle access of the posterior right sacral mass for core biopsy  IMPRESSION: Successful CT-guided right sacral soft tissue mass 18 gauge core biopsy   Electronically Signed   By: Jerilynn Mages.  Shick M.D.   On: 02/04/2015 15:37    Labs:  CBC:  Recent Labs  10/23/14 1114 02/03/15 1540 02/12/15 0728   WBC 6.4 6.4 5.6  HGB 15.8 12.4* 14.8  HCT 47.1 37.6* 43.5  PLT 137.0* 130* 160    COAGS:  Recent Labs  02/04/15 0835 02/12/15 0728  INR 1.09 1.09  APTT  --  25    BMP:  Recent Labs  10/23/14 1114 02/03/15 1445 02/03/15 1540  NA 141  --  139  K 5.0  --  3.8  CL 107  --  105  CO2 31  --  26  GLUCOSE 87  --  90  BUN 16  --  13  CALCIUM 9.6  --  8.6*  CREATININE 1.07 0.90 0.87  GFRNONAA  --   --  >60  GFRAA  --   --  >60    LIVER FUNCTION TESTS:  Recent Labs  10/23/14 1114 02/03/15 1540  BILITOT 0.7 0.6  AST 19 22  ALT 16 16*  ALKPHOS 75 59  PROT 6.7 5.8*  ALBUMIN 4.2 3.0*    TUMOR MARKERS: No results for input(s): AFPTM, CEA, CA199, CHROMGRNA in the last 8760 hours.  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  Thank you for this interesting consult.  I greatly enjoyed meeting Drayce Tawil and look forward to participating in their care.  A copy of this report was sent to the requesting provider on this date.  Signed: Dreux Mcgroarty, ART A 02/12/2015, 9:21 AM   I spent a total of  30 Minutes   in face to face in clinical consultation, greater than 50% of which was counseling/coordinating care for bone marrow biopsy.

## 2015-02-12 NOTE — Discharge Instructions (Signed)
Bone Marrow Aspiration, Bone Marrow Biopsy °Care After °Read the instructions outlined below and refer to this sheet in the next few weeks. These discharge instructions provide you with general information on caring for yourself after you leave the hospital. Your caregiver may also give you specific instructions. While your treatment has been planned according to the most current medical practices available, unavoidable complications occasionally occur. If you have any problems or questions after discharge, call your caregiver. °FINDING OUT THE RESULTS OF YOUR TEST °Not all test results are available during your visit. If your test results are not back during the visit, make an appointment with your caregiver to find out the results. Do not assume everything is normal if you have not heard from your caregiver or the medical facility. It is important for you to follow up on all of your test results.  °HOME CARE INSTRUCTIONS  °You have had sedation and may be sleepy or dizzy. Your thinking may not be as clear as usual. For the next 24 hours: °· Only take over-the-counter or prescription medicines for pain, discomfort, and or fever as directed by your caregiver. °· Do not drink alcohol. °· Do not smoke. °· Do not drive. °· Do not make important legal decisions. °· Do not operate heavy machinery. °· Do not care for small children by yourself. °· Keep your dressing clean and dry. You may replace dressing with a bandage after 24 hours. °· You may take a bath or shower after 24 hours. °· Use an ice pack for 20 minutes every 2 hours while awake for pain as needed. °SEEK MEDICAL CARE IF:  °· There is redness, swelling, or increasing pain at the biopsy site. °· There is pus coming from the biopsy site. °· There is drainage from a biopsy site lasting longer than one day. °· An unexplained oral temperature above 102° F (38.9° C) develops. °SEEK IMMEDIATE MEDICAL CARE IF:  °· You develop a rash. °· You have difficulty  breathing. °· You develop any reaction or side effects to medications given. °Document Released: 01/09/2005 Document Revised: 09/14/2011 Document Reviewed: 06/19/2008 °[employer]® Patient Information ©2015 [employer]. This information is not intended to replace advice given to you by your health care provider. Make sure you discuss any questions you have with your health care provider. °Conscious Sedation, Adult, Care After °Refer to this sheet in the next few weeks. These instructions provide you with information on caring for yourself after your procedure. Your health care provider may also give you more specific instructions. Your treatment has been planned according to current medical practices, but problems sometimes occur. Call your health care provider if you have any problems or questions after your procedure. °WHAT TO EXPECT AFTER THE PROCEDURE  °After your procedure: °· You may feel sleepy, clumsy, and have poor balance for several hours. °· Vomiting may occur if you eat too soon after the procedure. °HOME CARE INSTRUCTIONS °· Do not participate in any activities where you could become injured for at least 24 hours. Do not: °¨ Drive. °¨ Swim. °¨ Ride a bicycle. °¨ Operate heavy machinery. °¨ Cook. °¨ Use power tools. °¨ Climb ladders. °¨ Work from a high place. °· Do not make important decisions or sign legal documents until you are improved. °· If you vomit, drink water, juice, or soup when you can drink without vomiting. Make sure you have little or no nausea before eating solid foods. °· Only take over-the-counter or prescription medicines for pain, discomfort, or fever   as directed by your health care provider.  Make sure you and your family fully understand everything about the medicines given to you, including what side effects may occur.  You should not drink alcohol, take sleeping pills, or take medicines that cause drowsiness for at least 24 hours.  If you smoke, do not smoke without  supervision.  If you are feeling better, you may resume normal activities 24 hours after you were sedated.  Keep all appointments with your health care provider. SEEK MEDICAL CARE IF:  Your skin is pale or bluish in color.  You continue to feel nauseous or vomit.  Your pain is getting worse and is not helped by medicine.  You have bleeding or swelling.  You are still sleepy or feeling clumsy after 24 hours. SEEK IMMEDIATE MEDICAL CARE IF:  You develop a rash.  You have difficulty breathing.  You develop any type of allergic problem.  You have a fever. MAKE SURE YOU:  Understand these instructions.  Will watch your condition.  Will get help right away if you are not doing well or get worse. Document Released: 04/12/2013 Document Reviewed: 04/12/2013 [facility] Patient Information 2015 [place], [place]. This information is not intended to replace advice given to you by your health care provider. Make sure you discuss any questions you have with your health care provider.

## 2015-02-13 ENCOUNTER — Ambulatory Visit: Payer: Medicare Other

## 2015-02-13 ENCOUNTER — Other Ambulatory Visit: Payer: Self-pay | Admitting: Oncology

## 2015-02-13 ENCOUNTER — Encounter: Payer: Self-pay | Admitting: Skilled Nursing Facility1

## 2015-02-13 ENCOUNTER — Ambulatory Visit
Admit: 2015-02-13 | Discharge: 2015-02-13 | Disposition: A | Discharge status: Home / Self Care | Payer: Medicare Other | Attending: Radiation Oncology | Admitting: Radiation Oncology

## 2015-02-13 ENCOUNTER — Inpatient Hospital Stay: Payer: Medicare Other | Admitting: Oncology

## 2015-02-13 DIAGNOSIS — C801 Malignant (primary) neoplasm, unspecified: Secondary | ICD-10-CM | POA: Diagnosis not present

## 2015-02-13 DIAGNOSIS — C7951 Secondary malignant neoplasm of bone: Secondary | ICD-10-CM

## 2015-02-13 DIAGNOSIS — Z510 Encounter for antineoplastic radiation therapy: Secondary | ICD-10-CM | POA: Diagnosis not present

## 2015-02-13 NOTE — Progress Notes (Signed)
Subjective:     Patient ID: Perry Nielsen, male   DOB: April 09, 1941, 74 y.o.   MRN: [id number]  HPI   Review of Systems     Objective:   Physical Exam To assist the pt in identifying dietary strategies to gain some lost wt back.     Assessment:     Pt identified as being malnourished due to losing some wt.  Pt contacted via the telephone at [phone]. Pt states he has lost about 10-12 pounds but his appetite is good. Pt states he has tried metamucil for the past few days but he still has yet to have a bowel movement.    Plan:     Dietitian educated the pt on causes of constipation. Dietitian also encouraged the pt to eat 3 meals a day and 2-3 snacls as well as plenty of water. Detitian also suggested he try a fermented product such as Kefir to get his gut microbiota back in balance.

## 2015-02-14 ENCOUNTER — Ambulatory Visit: Payer: Medicare Other

## 2015-02-14 ENCOUNTER — Ambulatory Visit
Admit: 2015-02-14 | Discharge: 2015-02-14 | Disposition: A | Discharge status: Home / Self Care | Payer: Medicare Other | Attending: Radiation Oncology | Admitting: Radiation Oncology

## 2015-02-14 ENCOUNTER — Ambulatory Visit (HOSPITAL_BASED_OUTPATIENT_CLINIC_OR_DEPARTMENT_OTHER): Payer: Medicare Other | Admitting: Oncology

## 2015-02-14 ENCOUNTER — Encounter: Payer: Self-pay | Admitting: Oncology

## 2015-02-14 VITALS — BP 121/95 | HR 72 | Temp 98.6°F | Resp 17 | Ht 72.0 in | Wt 160.0 lb

## 2015-02-14 DIAGNOSIS — M899 Disorder of bone, unspecified: Secondary | ICD-10-CM

## 2015-02-14 DIAGNOSIS — K219 Gastro-esophageal reflux disease without esophagitis: Secondary | ICD-10-CM | POA: Diagnosis not present

## 2015-02-14 DIAGNOSIS — E440 Moderate protein-calorie malnutrition: Secondary | ICD-10-CM | POA: Diagnosis not present

## 2015-02-14 DIAGNOSIS — N400 Enlarged prostate without lower urinary tract symptoms: Secondary | ICD-10-CM | POA: Diagnosis not present

## 2015-02-14 DIAGNOSIS — C412 Malignant neoplasm of vertebral column: Secondary | ICD-10-CM | POA: Diagnosis not present

## 2015-02-14 DIAGNOSIS — M549 Dorsalgia, unspecified: Secondary | ICD-10-CM | POA: Diagnosis not present

## 2015-02-14 DIAGNOSIS — C801 Malignant (primary) neoplasm, unspecified: Secondary | ICD-10-CM | POA: Diagnosis not present

## 2015-02-14 DIAGNOSIS — D696 Thrombocytopenia, unspecified: Secondary | ICD-10-CM

## 2015-02-14 DIAGNOSIS — C7951 Secondary malignant neoplasm of bone: Secondary | ICD-10-CM

## 2015-02-14 DIAGNOSIS — Z510 Encounter for antineoplastic radiation therapy: Secondary | ICD-10-CM | POA: Diagnosis not present

## 2015-02-14 DIAGNOSIS — M4854XA Collapsed vertebra, not elsewhere classified, thoracic region, initial encounter for fracture: Secondary | ICD-10-CM | POA: Diagnosis not present

## 2015-02-14 DIAGNOSIS — K5900 Constipation, unspecified: Secondary | ICD-10-CM

## 2015-02-14 DIAGNOSIS — M21371 Foot drop, right foot: Secondary | ICD-10-CM | POA: Diagnosis not present

## 2015-02-14 MED ORDER — SORBITOL 70 % PO SOLN: 30.0000 mL | Freq: Two times a day (BID) | ORAL | Status: DC

## 2015-02-14 MED ORDER — MAGNESIUM CITRATE PO SOLN: ORAL | Status: DC

## 2015-02-14 NOTE — Progress Notes (Signed)
[facility] OFFICE PROGRESS NOTE   Diagnosis: Malignant bone lesions  INTERVAL HISTORY:   Mr. Brenning returns as scheduled. He continues radiation to the sacral mass. He is taking Decadron twice daily. The right foot pain has improved with oxycodone. He is participated in physical therapy. He complains of constipation. He is able to control his bladder and bowel movements. No pain at other sites.  He underwent a bone marrow biopsy 02/12/2015. He tolerated the procedure well. I discussed the bone marrow pathology with Dr. Gari Crown. He indicates the bone marrow reveals no evidence of a malignancy.  Objective:  Vital signs in last 24 hours:  Blood pressure 121/95, pulse 72, temperature 98.6 F (37 C), temperature source Oral, resp. rate 17, height 6' (1.829 m), weight 160 lb (72.576 kg), SpO2 99 %.    HEENT: Neck without mass, mild left buccal thrush Lymphatics: No cervical or supraclavicular nodes Resp: Bronchial sounds at the upper posterior chest bilaterally, no respiratory distress Cardio: Regular rate and rhythm GI: No hepatomegaly Vascular: No leg edema Neuro: Decreased strength with dorsi flexion at the right foot, the leg strength is otherwise intact  Skin: Small ecchymoses at the forearms   Portacath/PICC-without erythema  Lab Results:  Lab Results  Component Value Date   WBC 5.6 02/12/2015   HGB 14.8 02/12/2015   HCT 43.5 02/12/2015   MCV 89.5 02/12/2015   PLT 160 02/12/2015   NEUTROABS 4.6 02/03/2015   02/08/2015-beta-2 microglobulin 1.7, serum protein electrophoresis-M spike not observed, IgG 687, IgA 138, IgM 39, Or free light chain 7.09, lambda free light chains 2.11, serum immunofixation-normal   Imaging:  Ct Biopsy  02/12/2015   CLINICAL DATA:  Bone lesions  EXAM: CT-GUIDED BONE MARROW ASPIRATE AND CORE.  MEDICATIONS AND MEDICAL HISTORY: Versed 2 mg, Fentanyl 50 mcg.  As antibiotic prophylaxis, Ancef was ordered pre-procedure and administered  intravenously within one hour of incision.  ANESTHESIA/SEDATION: Moderate sedation time: 12 minutes  PROCEDURE: The procedure, risks, benefits, and alternatives were explained to the patient. Questions regarding the procedure were encouraged and answered. The patient understands and consents to the procedure.  The back was prepped with Betadine in a sterile fashion, and a sterile drape was applied covering the operative field. A sterile gown and sterile gloves were used for the procedure.  Under CT guidance, a(n) 11 gauge guide needle was advanced into the right iliac bone via posterior approach. Aspirates and a core were obtained. Final imaging was performed.  Patient tolerated the procedure well without complication. Vital sign monitoring by nursing staff during the procedure will continue as patient is in the special procedures unit for post procedure observation.  FINDINGS: The images document guide needle placement within the right iliac bone. Post biopsy images demonstrate no hemorrhage.  COMPLICATIONS: None  IMPRESSION: Successful CT-guided right iliac bone marrow aspirate and core.   Electronically Signed   By: Marybelle Killings M.D.   On: 02/12/2015 13:00    Medications: I have reviewed the patient's current medications.  Assessment/Plan: 1. 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 Thrombocytopenia 3. T7 compression fracture-likely pathologic 4. 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 He will continue Decadron and oxycodone.  I discussed the sacral mass histology with Dr. Gari Crown. 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 He request  additional core biopsies to be sent fresh to pathology for a lymphoma evaluation. The most likely clinical diagnosis is lymphoma, but  a plasmacytic neoplasm remains possible. The peripheral blood workup for multiple myeloma was negative and a bone marrow biopsy does not reveal myeloma.  Mr. Gatson will begin sorbitol for the constipation. He will return for an office visit on 02/20/2015. We are trying to arrange the biopsy 02/18/2015.  Betsy Coder, MD  02/14/2015  5:15 PM

## 2015-02-14 NOTE — Progress Notes (Signed)
Checked in new pt with no financial concerns prior to seeing the dr.  [name] has 2 insurances so financial assistance may not be needed but he has my card for any billing questions, concerns or if financial assistance is needed.

## 2015-02-15 ENCOUNTER — Encounter: Payer: Self-pay | Admitting: Radiation Oncology

## 2015-02-15 ENCOUNTER — Telehealth: Payer: Self-pay | Admitting: Oncology

## 2015-02-15 ENCOUNTER — Ambulatory Visit
Admission: RE | Admit: 2015-02-15 | Discharge: 2015-02-15 | Disposition: A | Discharge status: Home / Self Care | Payer: Medicare Other | Source: Ambulatory Visit | Attending: Radiation Oncology | Admitting: Radiation Oncology

## 2015-02-15 VITALS — BP 119/80 | HR 64 | Resp 16 | Wt 161.2 lb

## 2015-02-15 DIAGNOSIS — C412 Malignant neoplasm of vertebral column: Secondary | ICD-10-CM | POA: Diagnosis not present

## 2015-02-15 DIAGNOSIS — M549 Dorsalgia, unspecified: Secondary | ICD-10-CM | POA: Diagnosis not present

## 2015-02-15 DIAGNOSIS — C801 Malignant (primary) neoplasm, unspecified: Secondary | ICD-10-CM | POA: Diagnosis not present

## 2015-02-15 DIAGNOSIS — E440 Moderate protein-calorie malnutrition: Secondary | ICD-10-CM | POA: Diagnosis not present

## 2015-02-15 DIAGNOSIS — C7951 Secondary malignant neoplasm of bone: Secondary | ICD-10-CM | POA: Diagnosis not present

## 2015-02-15 DIAGNOSIS — M21371 Foot drop, right foot: Secondary | ICD-10-CM | POA: Diagnosis not present

## 2015-02-15 DIAGNOSIS — K219 Gastro-esophageal reflux disease without esophagitis: Secondary | ICD-10-CM | POA: Diagnosis not present

## 2015-02-15 DIAGNOSIS — N400 Enlarged prostate without lower urinary tract symptoms: Secondary | ICD-10-CM | POA: Diagnosis not present

## 2015-02-15 DIAGNOSIS — Z510 Encounter for antineoplastic radiation therapy: Secondary | ICD-10-CM | POA: Diagnosis not present

## 2015-02-15 NOTE — Telephone Encounter (Signed)
Spoke with wife and she is aware of her 8/17 schedule

## 2015-02-15 NOTE — Progress Notes (Signed)
  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  Weight and vitals stable. Reports taking decadron 4 mg bid. Thrush noted in oral mucosa. Denies sore throat or taste changes. Reports foot drop is improving with PT thus, patient is able to apply more weight to right foot. Using walker or cane at home to ambulate. Reports pain 1 on a scale of 0-10 in right hip and leg. Patient reports current pain medication regimen is working well to control pain.  Reports having a bowel movement last night following days of constipation. Provided patient with constipation education handouts. Patient was curious if he could cut down on his steroid medication use. Set-up films were reviewed. The chart was checked.  Physical Findings  No significant changes. No significant degree of thrush noted.   Impression The patient is tolerating radiation.   Plan Continue treatment as planned. Advised that the patient can decrease his steroid medication, decadron, to 2 mg twice daily. Patient does not need a refill at this time. Patient understands to let us know if a sore throat develops due to thrush.   This document serves as a record of services personally performed by Tyler Pita, MD. It was created on his behalf by Arlyce Harman, a trained medical scribe. The creation of this record is based on the scribe's personal observations and the provider's statements to them. This document has been checked and approved by the attending provider.      Sheral Apley Tammi Klippel, M.D.

## 2015-02-15 NOTE — Progress Notes (Signed)
Weight and vitals stable. Reports taking decadron 4 mg bid. Thrush noted in oral mucosa. Reports foot drop is improving with PT thus, patient is able to apply more weight to right foot. Using walker or cane at home to ambulate. Reports pain 1 on a scale of 0-10 in right hip and leg. Patient reports current pain medication regimen is working well to control pain. Reports having a bowel movement last night following days of constipation. Provided patient with constipation education handouts.  BP 119/80 mmHg  Pulse 64  Resp 16  Wt 161 lb 3.2 oz (73.12 kg) Wt Readings from Last 3 Encounters:  02/15/15 161 lb 3.2 oz (73.12 kg)  02/14/15 160 lb (72.576 kg)  02/12/15 162 lb (73.483 kg)

## 2015-02-18 ENCOUNTER — Ambulatory Visit
Admission: RE | Admit: 2015-02-18 | Discharge: 2015-02-18 | Disposition: A | Discharge status: Home / Self Care | Payer: Medicare Other | Source: Ambulatory Visit | Attending: Radiation Oncology | Admitting: Radiation Oncology

## 2015-02-18 ENCOUNTER — Other Ambulatory Visit: Payer: Self-pay | Admitting: Radiation Oncology

## 2015-02-18 DIAGNOSIS — C7951 Secondary malignant neoplasm of bone: Secondary | ICD-10-CM | POA: Diagnosis not present

## 2015-02-18 DIAGNOSIS — C801 Malignant (primary) neoplasm, unspecified: Secondary | ICD-10-CM | POA: Diagnosis not present

## 2015-02-18 DIAGNOSIS — Z510 Encounter for antineoplastic radiation therapy: Secondary | ICD-10-CM | POA: Diagnosis not present

## 2015-02-18 MED ORDER — OXYCODONE HCL ER 10 MG PO T12A: 10.0000 mg | EXTENDED_RELEASE_TABLET | Freq: Two times a day (BID) | ORAL | Status: DC

## 2015-02-18 MED ORDER — OXYCODONE HCL 5 MG PO TABS: 5.0000 mg | ORAL_TABLET | ORAL | Status: DC | PRN

## 2015-02-18 NOTE — Progress Notes (Addendum)
Patient came to nursing to get pain medicatins refilled:Hydrocodne  5/325mg  and Oxycodone 5mg , shown list to Dr. Lisbeth Renshaw,  New Rxs written and given to patient and wife  new Rx Oxycontin 10mg   q 12hr  ,And Oxycodone 5mg , 1-2 tabs q 4h prn, explained to take the 12hour pain med  alwaysa nd then for breakthrough pain the Oxycodone 5mg , stop the hydrocodone 5/325mg , verbal understanding  2:30 PM

## 2015-02-19 ENCOUNTER — Ambulatory Visit (HOSPITAL_COMMUNITY)
Admission: RE | Admit: 2015-02-19 | Discharge: 2015-02-19 | Disposition: A | Discharge status: Home / Self Care | Payer: Medicare Other | Source: Ambulatory Visit | Attending: Oncology | Admitting: Oncology

## 2015-02-19 ENCOUNTER — Other Ambulatory Visit: Payer: Self-pay | Admitting: Radiology

## 2015-02-19 ENCOUNTER — Encounter (HOSPITAL_COMMUNITY): Payer: Self-pay

## 2015-02-19 ENCOUNTER — Ambulatory Visit
Admission: RE | Admit: 2015-02-19 | Discharge: 2015-02-19 | Disposition: A | Discharge status: Home / Self Care | Payer: Medicare Other | Source: Ambulatory Visit | Attending: Radiation Oncology | Admitting: Radiation Oncology

## 2015-02-19 DIAGNOSIS — G8929 Other chronic pain: Secondary | ICD-10-CM | POA: Insufficient documentation

## 2015-02-19 DIAGNOSIS — E669 Obesity, unspecified: Secondary | ICD-10-CM | POA: Insufficient documentation

## 2015-02-19 DIAGNOSIS — D492 Neoplasm of unspecified behavior of bone, soft tissue, and skin: Secondary | ICD-10-CM | POA: Diagnosis not present

## 2015-02-19 DIAGNOSIS — Z7982 Long term (current) use of aspirin: Secondary | ICD-10-CM | POA: Diagnosis not present

## 2015-02-19 DIAGNOSIS — C801 Malignant (primary) neoplasm, unspecified: Secondary | ICD-10-CM | POA: Diagnosis not present

## 2015-02-19 DIAGNOSIS — C61 Malignant neoplasm of prostate: Secondary | ICD-10-CM | POA: Insufficient documentation

## 2015-02-19 DIAGNOSIS — F17210 Nicotine dependence, cigarettes, uncomplicated: Secondary | ICD-10-CM | POA: Diagnosis not present

## 2015-02-19 DIAGNOSIS — K219 Gastro-esophageal reflux disease without esophagitis: Secondary | ICD-10-CM | POA: Diagnosis not present

## 2015-02-19 DIAGNOSIS — M21379 Foot drop, unspecified foot: Secondary | ICD-10-CM | POA: Insufficient documentation

## 2015-02-19 DIAGNOSIS — Z923 Personal history of irradiation: Secondary | ICD-10-CM | POA: Insufficient documentation

## 2015-02-19 DIAGNOSIS — C7951 Secondary malignant neoplasm of bone: Secondary | ICD-10-CM | POA: Insufficient documentation

## 2015-02-19 DIAGNOSIS — Z510 Encounter for antineoplastic radiation therapy: Secondary | ICD-10-CM | POA: Diagnosis not present

## 2015-02-19 DIAGNOSIS — M79604 Pain in right leg: Secondary | ICD-10-CM | POA: Insufficient documentation

## 2015-02-19 LAB — CBC
HCT: 42.3 % (ref 39.0–52.0)
HEMOGLOBIN: 14 g/dL (ref 13.0–17.0)
MCH: 30.4 pg (ref 26.0–34.0)
MCHC: 33.1 g/dL (ref 30.0–36.0)
MCV: 91.8 fL (ref 78.0–100.0)
Platelets: 110 10*3/uL — ABNORMAL LOW (ref 150–400)
RBC: 4.61 MIL/uL (ref 4.22–5.81)
RDW: 14.2 % (ref 11.5–15.5)
WBC: 6.7 10*3/uL (ref 4.0–10.5)

## 2015-02-19 LAB — APTT: aPTT: 25 seconds (ref 24–37)

## 2015-02-19 LAB — PROTIME-INR
INR: 1.02 (ref 0.00–1.49)
Prothrombin Time: 13.6 seconds (ref 11.6–15.2)

## 2015-02-19 MED ORDER — SODIUM CHLORIDE 0.9 % IV SOLN
Freq: Once | INTRAVENOUS | Status: AC
  Administered 2015-02-19: 11:00:00 via INTRAVENOUS

## 2015-02-19 MED ORDER — MIDAZOLAM HCL 2 MG/2ML IJ SOLN
INTRAMUSCULAR | Status: AC
  Filled 2015-02-19: qty 6

## 2015-02-19 MED ORDER — MIDAZOLAM HCL 2 MG/2ML IJ SOLN
INTRAMUSCULAR | Status: AC | PRN
  Administered 2015-02-19 (×4): 0.5 mg via INTRAVENOUS
  Administered 2015-02-19: 1 mg via INTRAVENOUS

## 2015-02-19 MED ORDER — FENTANYL CITRATE (PF) 100 MCG/2ML IJ SOLN
INTRAMUSCULAR | Status: AC
  Filled 2015-02-19: qty 4

## 2015-02-19 MED ORDER — HYDROCODONE-ACETAMINOPHEN 5-325 MG PO TABS
1.0000 | ORAL_TABLET | ORAL | Status: DC | PRN
  Filled 2015-02-19: qty 2

## 2015-02-19 MED ORDER — FENTANYL CITRATE (PF) 100 MCG/2ML IJ SOLN
INTRAMUSCULAR | Status: AC | PRN
  Administered 2015-02-19: 50 ug via INTRAVENOUS

## 2015-02-19 NOTE — Procedures (Signed)
CT 11g core bx sacral lesion x5 No complication No blood loss. See complete dictation in [facility].

## 2015-02-19 NOTE — H&P (Signed)
Chief Complaint: Patient was seen in consultation today for Rt sacral mass biopsy at the request of Sherrill,Gary B  Referring Physician(s): Sherrill,Gary B  History of Present Illness: Perry Nielsen is a 74 y.o. male   Back pain worsening x weeks Work up reveals bony mets Pt with remote hx skin ca 02/04/15 bx of R sacral lesion shows malignant cells without more specific finding Bone marrow bx 02/12/15 shows hypercellular with megaloblastoid changes: negative Now scheduled for repeat biopsy to include core samples of Rt sacral mass per Dr Benay Spice  Past Medical History  Diagnosis Date  . 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 ALLERGIC RHINITIS 08/03/2007  . GERD 02/18/2007  . BENIGN PROSTATIC HYPERTROPHY 02/18/2007  . KNEE PAIN, LEFT, CHRONIC 08/07/2008  . BACK PAIN 09/18/2009  . HAMMER TOE 08/03/2007  . 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 CAROTID BRUIT, LEFT 08/07/2008  . Abdominal pain, right upper quadrant 09/18/2009  . Skin cancer   . Plantar fasciitis of right foot 03/24/2012  . BPH associated with nocturia 02/18/2007    Qualifier: Diagnosis of  By: Tiney Rouge CMA, Brent.P[place]      Past Surgical History  Procedure Laterality Date  . Shoulder open rotator cuff repair  07/06/05    right  . Knee arthroscopy      left  . Tonsillectomy and adenoidectomy    . Transurethral resection of prostate    . Cataract extraction w/ intraocular lens  implant, bilateral      Allergies: Antihistamines, diphenhydramine-type  Medications: Prior to Admission medications   Medication Sig Start Date End Date Taking? Authorizing Provider  aspirin EC 81 MG tablet Take 81 mg by mouth daily.    Historical Provider, MD  dexamethasone (DECADRON) 4 MG tablet Take 1 tablet (4 mg total) by mouth 2 (two) times daily. 02/08/15   Charlynne Cousins, MD  fish oil-omega-3 fatty acids 1000 MG capsule Take 2 g by mouth daily.      Historical Provider, MD  fluticasone (FLONASE) 50 MCG/ACT  nasal spray Place 1 spray into both nostrils every evening.    Historical Provider, MD  glucosamine-chondroitin 500-400 MG tablet Take 2 tablets by mouth daily.     Historical Provider, MD  HYDROcodone-acetaminophen (NORCO/VICODIN) 5-325 MG per tablet Take 1-2 tablets by mouth every 6 (six) hours as needed for moderate pain. 02/08/15   Charlynne Cousins, MD  ibuprofen (ADVIL,MOTRIN) 200 MG tablet Take 400-800 mg by mouth every 6 (six) hours as needed (for pain.).    Historical Provider, MD  magnesium citrate SOLN Drink one third of the bottle. Repeat if no bowel movement in 4 hours. 02/14/15   Ladell Pier, MD  Multiple Vitamin (MULTIVITAMIN) tablet Take 1 tablet by mouth daily.      Historical Provider, MD  oxyCODONE (OXY IR/ROXICODONE) 5 MG immediate release tablet Take 1-2 tablets (5-10 mg total) by mouth every 4 (four) hours as needed for severe pain. 02/18/15   Kyung Rudd, MD  OxyCODONE (OXYCONTIN) 10 mg T12A 12 hr tablet Take 1 tablet (10 mg total) by mouth every 12 (twelve) hours. 02/18/15   Kyung Rudd, MD  oxymetazoline (AFRIN) 0.05 % nasal spray Place 1 spray into both nostrils 2 (two) times daily as needed for congestion.    Historical Provider, MD  psyllium (HYDROCIL/METAMUCIL) 95 % PACK Take 1 packet by mouth daily as needed for mild constipation or moderate constipation.     Historical Provider, MD  sildenafil (VIAGRA) 100 MG  tablet Take 0.5-1 tablets (50-100 mg total) by mouth daily as needed for erectile dysfunction. 10/23/14   Dorena Cookey, MD  sorbitol 70 % solution Take 30 mLs by mouth 2 (two) times daily. 02/14/15   Ladell Pier, MD  tetrahydrozoline 0.05 % ophthalmic solution Place 1-2 drops into both eyes 4 (four) times daily as needed (for eye irritation).    Historical Provider, MD  traZODone (DESYREL) 150 MG tablet Take 1 tablet (150 mg total) by mouth at bedtime. 10/23/14   Dorena Cookey, MD     Family History  Problem Relation Age of Onset  . Heart attack Other   .  Aneurysm Other     aortic  . Coronary artery disease Sister   . Cancer Father     prostate  . Pulmonary fibrosis Mother   . Uterine cancer Mother     Social History   Social History  . Marital Status: Married    Spouse Name: N/A  . Number of Children: N/A  . Years of Education: N/A   Social History Main Topics  . Smoking status: Never Smoker   . Smokeless tobacco: None  . Alcohol Use: 0.0 oz/week    0 Standard drinks or equivalent per week     Comment: 2-3 times a week. Beer.   . Drug Use: No  . Sexual Activity: Not Asked   Other Topics Concern  . None   Social History Narrative   Married.  Very active.       Review of Systems: A 12 point ROS discussed and pertinent positives are indicated in the HPI above.  All other systems are negative.  Review of Systems  Constitutional: Positive for activity change.  Respiratory: Negative for shortness of breath.   Cardiovascular: Negative for chest pain.  Musculoskeletal: Positive for back pain.  Neurological: Positive for weakness.  Psychiatric/Behavioral: Negative for behavioral problems and confusion.    Vital Signs: BP 120/89 mmHg  Pulse 68  Temp(Src) 97.9 F (36.6 C) (Oral)  Resp 18  Ht 6' (1.829 m)  Wt 161 lb 3.2 oz (73.12 kg)  BMI 21.86 kg/m2  SpO2 98%  Physical Exam  Constitutional: He is oriented to person, place, and time.  Cardiovascular: Normal rate, regular rhythm and normal heart sounds.   No murmur heard. Pulmonary/Chest: Effort normal and breath sounds normal. He has no wheezes.  Abdominal: Soft. Bowel sounds are normal. There is no tenderness.  Musculoskeletal: Normal range of motion.  Neurological: He is alert and oriented to person, place, and time.  Skin: Skin is warm and dry.  Psychiatric: He has a normal mood and affect. His behavior is normal. Judgment and thought content normal.  Nursing note and vitals reviewed.   Mallampati Score:  MD Evaluation Airway: WNL Heart: WNL Abdomen:  WNL Chest/ Lungs: WNL ASA  Classification: 3 Mallampati/Airway Score: One  Imaging: Dg Chest 2 View  02/03/2015   CLINICAL DATA:  Chest and back pain for 2 weeks. Prostate carcinoma.  EXAM: CHEST  2 VIEW  COMPARISON:  Thoracic spine radiographs on 01/15/2015  FINDINGS: The heart size and mediastinal contours are within normal limits. Both lungs are clear. No evidence of pneumothorax or pleural effusion.  Mild thoracic spine degenerative changes again noted. A moderate wedge compression fracture of a mid thoracic vertebral body is seen which appears new since recent exam. No focal sclerotic or lytic bone lesions visualized.  IMPRESSION: New moderate wedge compression fracture of mid thoracic vertebral body.  No  active cardiopulmonary disease.   Electronically Signed   By: Earle Gell M.D.   On: 02/03/2015 16:27   Ct Chest W Contrast  02/07/2015   CLINICAL DATA:  Two-month history of RIGHT hip and back pain radiating to RIGHT lower extremity, weight loss, weakness, RIGHT lateral chest discomfort and night sweats, past history basal cell skin cancer, recent abnormal MRI lumbar spine showing bony metastatic disease of L5, sacrum and ilii, question primary tumor  EXAM: CT CHEST, ABDOMEN, AND PELVIS WITH CONTRAST  TECHNIQUE: Multidetector CT imaging of the chest, abdomen and pelvis was performed following the standard protocol during bolus administration of intravenous contrast. Sagittal and coronal MPR images reconstructed from axial data set.  CONTRAST:  184mL OMNIPAQUE IOHEXOL 300 MG/ML  SOLN IV.  COMPARISON:  None  FINDINGS: CT CHEST FINDINGS  Minimal scattered atherosclerotic calcification.  Minimal gynecomastia.  Vascular structures grossly patent on nondedicated exam.  No thoracic adenopathy.  Scattered mild interstitial prominence in the periphery of the lower lobes with minimal RIGHT basilar atelectasis.  Lungs otherwise clear.  No infiltrate, pleural effusion, pneumothorax or mass/ nodule.  Compression  fracture of T7 with mild anterior height loss.  No additional osseous findings/ metastatic lesions.  CT ABDOMEN AND PELVIS FINDINGS  BILATERAL renal cysts.  Splenic mass 4.9 x 4.1 x 4.4 cm with minimal central necrosis question metastatic focus.  Remainder of liver, gallbladder, spleen, pancreas, kidneys and adrenal glands normal.  Mild scattered atherosclerotic changes with minimal aneurysmal dilatation of the distal abdominal aorta 3.1 x 2.5 cm image 84.  Normal appendix.  Bladder decompressed.  Unremarkable ureters.  Prostatic enlargement gland 6.1 x 3.2 cm image 119.  Stomach and bowel loops normal.  No additional mass, adenopathy, free air, free fluid or hernia.  Large destructive metastatic focus within RIGHT sacral ala 5.0 x 4.3 x 4.1 cm.  Subtle destructive focus within the superior central aspect of the L5 vertebral body.  No additional osseous lesions identified.  IMPRESSION: Mild T7 compression fracture, unable to exclude pathologic fracture.  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  Suspected splenic metastasis.  No definite primary tumor identified.  Prostatic enlargement.   Electronically Signed   By: Lavonia Dana M.D.   On: 02/07/2015 07:52   Mr Lumbar Spine Wo Contrast  02/03/2015   CLINICAL DATA:  Foot drop. RIGHT leg pain extending to calf and toes. Personal history of prostate cancer. Chronic back pain with rapid worsening of symptoms recently.  EXAM: MRI LUMBAR SPINE WITHOUT CONTRAST  TECHNIQUE: Multiplanar, multisequence MR imaging of the lumbar spine was performed. No intravenous contrast was administered.  COMPARISON:  None.  FINDINGS: Segmentation: The numbering convention used for this exam termed L5-S1 as the last intervertebral disc space.  Alignment: Mild levoconvex curve of the lumbar spine. No spondylolisthesis.  Vertebrae: Bone marrow signal shows suppression of normal fatty marrow. This is a nonspecific finding most commonly associated with obesity, anemia, cigarette  smoking or chronic disease.  Metastatic disease of the lumbar spine is also present. There is a large vertebral body metastasis in the anterior L5 vertebra without pathologic compression fracture. The other lumbar vertebrae show mild degenerative changes without bony metastatic disease.  There is also metastatic disease to the sacrum and iliac bones bilaterally. Additional coronal images of the sacrum and SI joints were performed which demonstrate metastatic disease. Although only partially visible, some portions of the metastatic disease in the sacrum enhance however there is a large necrotic region in the medial RIGHT  sacral ala. There is encroachment on the RIGHT S1 nerve in the neural foramen by the expanded RIGHT sacral ala.  Conus medullaris: Normal termination dorsal to L2. No intrathecal or dural metastases are present.  Paraspinal tissues: Bilateral renal cysts appear simple. Aortic ectasia and atherosclerosis. Maximal diameter the infrarenal abdominal aorta is 26 mm. Bilateral iliac artery ectasia is also present, with the LEFT measuring 16 mm.  Disc levels:  Disc Signal: Lower lumbar disc desiccation.  T12-L1:  Negative.  L1-L2: Mild disc degeneration but no stenosis.  L2-L3:  Negative.  L3-L4:  Disc desiccation but no stenosis.  L4-L5: Disc desiccation. Bilateral facet arthritis with mild periarticular edema and enhancement. Mild central stenosis associated with facet hypertrophy and ligamentum flavum redundancy. Mild RIGHT foraminal stenosis associated with anterior facet spurring. This potentially affects the exiting RIGHT L4 nerve. The LEFT neural foramen appears adequately patent.  L5-S1: Disc desiccation and degeneration. Central canal and subarticular zones patent. Severe RIGHT facet arthrosis is present. Very mild RIGHT foraminal stenosis associated with endplate spurring and facet spurring.  IMPRESSION: 1. Bony metastatic disease to the L5 vertebra, sacrum and both iliac bones, most compatible  with metastatic prostate cancer in a patient with history of same. RIGHT S1 foraminal narrowing associated with expansile metastasis of the RIGHT sacral ala. 2. Bilateral L4-L5 facet arthritis with RIGHT foraminal stenosis potentially affecting the RIGHT L4 nerve. 3. Mild L5-S1 RIGHT foraminal stenosis potentially affecting the exiting RIGHT L5 nerve.   Electronically Signed   By: Dereck Ligas M.D.   On: 02/03/2015 15:51   Ct Abdomen Pelvis W Contrast  02/07/2015   CLINICAL DATA:  Two-month history of RIGHT hip and back pain radiating to RIGHT lower extremity, weight loss, weakness, RIGHT lateral chest discomfort and night sweats, past history basal cell skin cancer, recent abnormal MRI lumbar spine showing bony metastatic disease of L5, sacrum and ilii, question primary tumor  EXAM: CT CHEST, ABDOMEN, AND PELVIS WITH CONTRAST  TECHNIQUE: Multidetector CT imaging of the chest, abdomen and pelvis was performed following the standard protocol during bolus administration of intravenous contrast. Sagittal and coronal MPR images reconstructed from axial data set.  CONTRAST:  177mL OMNIPAQUE IOHEXOL 300 MG/ML  SOLN IV.  COMPARISON:  None  FINDINGS: CT CHEST FINDINGS  Minimal scattered atherosclerotic calcification.  Minimal gynecomastia.  Vascular structures grossly patent on nondedicated exam.  No thoracic adenopathy.  Scattered mild interstitial prominence in the periphery of the lower lobes with minimal RIGHT basilar atelectasis.  Lungs otherwise clear.  No infiltrate, pleural effusion, pneumothorax or mass/ nodule.  Compression fracture of T7 with mild anterior height loss.  No additional osseous findings/ metastatic lesions.  CT ABDOMEN AND PELVIS FINDINGS  BILATERAL renal cysts.  Splenic mass 4.9 x 4.1 x 4.4 cm with minimal central necrosis question metastatic focus.  Remainder of liver, gallbladder, spleen, pancreas, kidneys and adrenal glands normal.  Mild scattered atherosclerotic changes with minimal  aneurysmal dilatation of the distal abdominal aorta 3.1 x 2.5 cm image 84.  Normal appendix.  Bladder decompressed.  Unremarkable ureters.  Prostatic enlargement gland 6.1 x 3.2 cm image 119.  Stomach and bowel loops normal.  No additional mass, adenopathy, free air, free fluid or hernia.  Large destructive metastatic focus within RIGHT sacral ala 5.0 x 4.3 x 4.1 cm.  Subtle destructive focus within the superior central aspect of the L5 vertebral body.  No additional osseous lesions identified.  IMPRESSION: Mild T7 compression fracture, unable to exclude pathologic fracture.  Osseous metastases  RIGHT sacrum and L5 vertebral body.  Suspected splenic metastasis.  No definite primary tumor identified.  Prostatic enlargement.   Electronically Signed   By: Lavonia Dana M.D.   On: 02/07/2015 07:52   Ct Biopsy  02/12/2015   CLINICAL DATA:  Bone lesions  EXAM: CT-GUIDED BONE MARROW ASPIRATE AND CORE.  MEDICATIONS AND MEDICAL HISTORY: Versed 2 mg, Fentanyl 50 mcg.  As antibiotic prophylaxis, Ancef was ordered pre-procedure and administered intravenously within one hour of incision.  ANESTHESIA/SEDATION: Moderate sedation time: 12 minutes  PROCEDURE: The procedure, risks, benefits, and alternatives were explained to the patient. Questions regarding the procedure were encouraged and answered. The patient understands and consents to the procedure.  The back was prepped with Betadine in a sterile fashion, and a sterile drape was applied covering the operative field. A sterile gown and sterile gloves were used for the procedure.  Under CT guidance, a(n) 11 gauge guide needle was advanced into the right iliac bone via posterior approach. Aspirates and a core were obtained. Final imaging was performed.  Patient tolerated the procedure well without complication. Vital sign monitoring by nursing staff during the procedure will continue as patient is in the special procedures unit for post procedure observation.  FINDINGS: The images  document guide needle placement within the right iliac bone. Post biopsy images demonstrate no hemorrhage.  COMPLICATIONS: None  IMPRESSION: Successful CT-guided right iliac bone marrow aspirate and core.   Electronically Signed   By: Marybelle Killings M.D.   On: 02/12/2015 13:00   Ct Biopsy  02/04/2015   CLINICAL DATA:  Large right sacral mass compatible with metastatic disease. History of prostate cancer.  EXAM: CT GUIDED CORE BIOPSY OF LARGE RIGHT SACRAL SOFT TISSUE MASS  ANESTHESIA/SEDATION: 1.0  Mg IV Versed; 50 mcg IV Fentanyl  Total Moderate Sedation Time: 5 minutes.  PROCEDURE: The procedure risks, benefits, and alternatives were explained to the patient. Questions regarding the procedure were encouraged and answered. The patient understands and consents to the procedure.  The posterior right sacral region was prepped with Betadinein a sterile fashion, and a sterile drape was applied covering the operative field. A sterile gown and sterile gloves were used for the procedure. Local anesthesia was provided with 1% Lidocaine.  Previous imaging reviewed. Patient positioned prone. Noncontrast localization CT performed. The large right sacral destructive soft tissue mass was localized. Under sterile conditions and local anesthesia, a 17 gauge 6.8 cm access needle was advanced from a posterior approach to the mass. Needle position confirmed with CT. 18 gauge core biopsies obtained. Needle removed.  Complications: None immediate  FINDINGS: Imaging confirms needle access of the posterior right sacral mass for core biopsy  IMPRESSION: Successful CT-guided right sacral soft tissue mass 18 gauge core biopsy   Electronically Signed   By: Jerilynn Mages.  Shick M.D.   On: 02/04/2015 15:37    Labs:  CBC:  Recent Labs  10/23/14 1114 02/03/15 1540 02/12/15 0728  WBC 6.4 6.4 5.6  HGB 15.8 12.4* 14.8  HCT 47.1 37.6* 43.5  PLT 137.0* 130* 160    COAGS:  Recent Labs  02/04/15 0835 02/12/15 0728  INR 1.09 1.09  APTT  --   25    BMP:  Recent Labs  10/23/14 1114 02/03/15 1445 02/03/15 1540  NA 141  --  139  K 5.0  --  3.8  CL 107  --  105  CO2 31  --  26  GLUCOSE 87  --  90  BUN 16  --  13  CALCIUM 9.6  --  8.6*  CREATININE 1.07 0.90 0.87  GFRNONAA  --   --  >60  GFRAA  --   --  >60    LIVER FUNCTION TESTS:  Recent Labs  10/23/14 1114 02/03/15 1540  BILITOT 0.7 0.6  AST 19 22  ALT 16 16*  ALKPHOS 75 59  PROT 6.7 5.8*  ALBUMIN 4.2 3.0*    TUMOR MARKERS: No results for input(s): AFPTM, CEA, CA199, CHROMGRNA in the last 8760 hours.  Assessment and Plan:  Back pain Abn MRI revealing bony mets Rt sacral lesion bx 8/1---hypercellular but not specific finding BM bx 02/12/15: neg Now for repeat Rt sacral lesion bx to include core samples Risks and Benefits discussed with the patient including, but not limited to bleeding, infection, damage to adjacent structures or low yield requiring additional tests. All of the patient's questions were answered, patient is agreeable to proceed. Consent signed and in chart.   Thank you for this interesting consult.  I greatly enjoyed meeting Jeanclaude Wentworth and look forward to participating in their care.  A copy of this report was sent to the requesting provider on this date.  Signed: Chamia Schmutz A 02/19/2015, 10:43 AM   I spent a total of  30 Minutes   in face to face in clinical consultation, greater than 50% of which was counseling/coordinating care for rt sacral lesion core sample bx

## 2015-02-19 NOTE — Discharge Instructions (Signed)
Bone Biopsy, Needle, Care After Read the instructions outlined below and refer to this sheet in the next few weeks. These discharge instructions provide you with general information on caring for yourself after you leave the hospital. Your caregiver may also give you specific instructions. While your treatment has been planned according to the most current medical practices available, unavoidable complications sometimes occur. If you have any problems or questions after discharge, call your caregiver. Finding out the results of your test Not all test results are available during your visit. If your test results are not back during the visit, make an appointment with your caregiver to find out the results. Do not assume everything is normal if you have not heard from your caregiver or the medical facility. It is important for you to follow up on all of your test results.  SEEK MEDICAL CARE IF:   You have redness, swelling, or increasing pain at the site of the biopsy.  You have pus coming from the biopsy site.  You have drainage from the biopsy site lasting longer than 1 day.  You notice a bad smell coming from the biopsy site or dressing.  You develop persistent nausea or vomiting. SEEK IMMEDIATE MEDICAL CARE IF:  You have a fever.  You develop a rash.  You have difficulty breathing.  You develop any reaction or side effects to medicines given. Document Released: 01/09/2005 Document Revised: 04/12/2013 Document Reviewed: 11/27/2008 [facility] Patient Information 2015 [place], [place]. This information is not intended to replace advice given to you by your health care provider. Make sure you discuss any questions you have with your health care provider.    Conscious Sedation, Adult, Care After Refer to this sheet in the next few weeks. These instructions provide you with information on caring for yourself after your procedure. Your health care provider may also give you more specific  instructions. Your treatment has been planned according to current medical practices, but problems sometimes occur. Call your health care provider if you have any problems or questions after your procedure. WHAT TO EXPECT AFTER THE PROCEDURE  After your procedure: You may feel sleepy, clumsy, and have poor balance for several hours. Vomiting may occur if you eat too soon after the procedure. HOME CARE INSTRUCTIONS Do not participate in any activities where you could become injured for at least 24 hours. Do not: Drive. Swim. Ride a bicycle. Operate heavy machinery. Cook. Use power tools. Climb ladders. Work from a high place. Do not make important decisions or sign legal documents until you are improved. If you vomit, drink water, juice, or soup when you can drink without vomiting. Make sure you have little or no nausea before eating solid foods. Only take over-the-counter or prescription medicines for pain, discomfort, or fever as directed by your health care provider. Make sure you and your family fully understand everything about the medicines given to you, including what side effects may occur. You should not drink alcohol, take sleeping pills, or take medicines that cause drowsiness for at least 24 hours. If you smoke, do not smoke without supervision. If you are feeling better, you may resume normal activities 24 hours after you were sedated. Keep all appointments with your health care provider. SEEK MEDICAL CARE IF: Your skin is pale or bluish in color. You continue to feel nauseous or vomit. Your pain is getting worse and is not helped by medicine. You have bleeding or swelling. You are still sleepy or feeling clumsy after 24 hours. SEEK  IMMEDIATE MEDICAL CARE IF: You develop a rash. You have difficulty breathing. You develop any type of allergic problem. You have a fever. MAKE SURE YOU: Understand these instructions. Will watch your condition. Will get help right away if  you are not doing well or get worse. Document Released: 04/12/2013 Document Reviewed: 04/12/2013 [facility] Patient Information 2015 [place], [place]. This information is not intended to replace advice given to you by your health care provider. Make sure you discuss any questions you have with your health care provider.   Any blood disorders you have.

## 2015-02-20 ENCOUNTER — Ambulatory Visit
Admission: RE | Admit: 2015-02-20 | Discharge: 2015-02-20 | Disposition: A | Discharge status: Home / Self Care | Payer: Medicare Other | Source: Ambulatory Visit | Attending: Radiation Oncology | Admitting: Radiation Oncology

## 2015-02-20 ENCOUNTER — Ambulatory Visit (HOSPITAL_BASED_OUTPATIENT_CLINIC_OR_DEPARTMENT_OTHER): Payer: Medicare Other | Admitting: Oncology

## 2015-02-20 ENCOUNTER — Encounter: Payer: Self-pay | Admitting: Radiation Oncology

## 2015-02-20 VITALS — BP 115/71 | HR 68 | Temp 98.0°F | Resp 18 | Ht 72.0 in | Wt 159.4 lb

## 2015-02-20 DIAGNOSIS — C801 Malignant (primary) neoplasm, unspecified: Secondary | ICD-10-CM | POA: Diagnosis not present

## 2015-02-20 DIAGNOSIS — M21371 Foot drop, right foot: Secondary | ICD-10-CM | POA: Diagnosis not present

## 2015-02-20 DIAGNOSIS — D696 Thrombocytopenia, unspecified: Secondary | ICD-10-CM | POA: Diagnosis not present

## 2015-02-20 DIAGNOSIS — M899 Disorder of bone, unspecified: Secondary | ICD-10-CM | POA: Diagnosis not present

## 2015-02-20 DIAGNOSIS — C7951 Secondary malignant neoplasm of bone: Secondary | ICD-10-CM

## 2015-02-20 DIAGNOSIS — Z510 Encounter for antineoplastic radiation therapy: Secondary | ICD-10-CM | POA: Diagnosis not present

## 2015-02-20 DIAGNOSIS — M4854XA Collapsed vertebra, not elsewhere classified, thoracic region, initial encounter for fracture: Secondary | ICD-10-CM | POA: Diagnosis not present

## 2015-02-20 NOTE — Progress Notes (Signed)
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 He continues to have intermittent pain in the right foot. He will complete radiation to the sacrum today. The Decadron dose has been tapered. He was started on OxyContin by Dr. Lisbeth Renshaw. He continues physical therapy for the right foot drop. His bowels are moving.  Perry Nielsen underwent a repeat sacral mass biopsy on 02/19/2015. He tolerated procedure well  Objective:  Vital signs in last 24 hours:  Blood pressure 115/71, pulse 68, temperature 98 F (36.7 C), temperature source Oral, resp. rate 18, height 6' (1.829 m), weight 159 lb 6.4 oz (72.303 kg), SpO2 99 %.    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 History of prostate cancer. Previous biopsy was inconclusive. Patient has undergone radiation treatment to the region.  EXAM: CT GUIDED CORE BIOPSY OF   RIGHT SACRAL MASS  ANESTHESIA/SEDATION: Intravenous Fentanyl and Versed were administered as conscious sedation during continuous cardiorespiratory monitoring by the radiology RN, with a total moderate sedation time of 11 minutes.  PROCEDURE: The procedure risks, benefits, and alternatives were explained to the patient. Questions regarding the procedure were encouraged and answered. The patient understands and consents to the procedure. Previous imaging reviewed. Patient positioned prone. Noncontrast localization CT performed. The right sacral destructive mass was localized. The  posterior right sacral region was prepped with Betadine in a sterile fashion, and a sterile drape was applied covering the operative field. A sterile gown and sterile gloves were used for the procedure. Local anesthesia was provided with 1% Lidocaine.  Under CT fluoroscopic guidance, 5 passes with an 11 gauge Cook bone needle were obtained through different regions of the mass. Needle position confirmed with CT. Needle removed.  COMPLICATIONS: None immediate  FINDINGS: Imaging confirms needle access of the posterior right sacral mass for core biopsy  IMPRESSION: Successful CT-guided right sacral mass 11 gauge core biopsy   Electronically Signed   By: Lucrezia Europe M.D.   On: 02/19/2015 13:28    Medications: I have reviewed the patient's current medications.  Assessment/Plan: 1. Malignant-appearing bone lesions, biopsy of the right sacral mass on 02/04/2015 consistent with a hematopoietic neoplasm  Bone marrow biopsy 02/12/2015-no evidence of malignancy  Repeat sacral mass biopsy 02/19/2015-necrotic tissue, nondiagnostic 2. Thrombocytopenia 3. T7 compression fracture-likely pathologic 4. Right foot drop-likely secondary to nerve compression from the right sacral mass    Disposition:  I discussed the pathology from 02/19/2015 biopsy with Dr. Gari Crown earlier today. He indicates the tissue is largely necrotic and will be nondiagnostic. It is possible the tissue necrosis is related to treated tumor from Decadron and radiation.  I reviewed the imaging studies with interventional radiology. The lumbar and thoracic lesions will be difficult to biopsy. We decided to proceed with a staging PET scan in an attempt to identify another biopsy site. If the splenic lesion is hypermetabolic this can be biopsied.  I discussed the differential diagnosis and plan with Mr.  Nielsen and his wife. He is in agreement to proceed with a PET scan and repeat biopsy within the next week.  Follow-up will be arranged after the  biopsy procedure. He will contact us in the interim as needed.  Betsy Coder, MD  02/20/2015  1:30 PM

## 2015-02-21 ENCOUNTER — Inpatient Hospital Stay (HOSPITAL_COMMUNITY): Admission: RE | Admit: 2015-02-21 | Payer: Medicare Other | Source: Ambulatory Visit

## 2015-02-21 DIAGNOSIS — K219 Gastro-esophageal reflux disease without esophagitis: Secondary | ICD-10-CM | POA: Diagnosis not present

## 2015-02-21 DIAGNOSIS — N400 Enlarged prostate without lower urinary tract symptoms: Secondary | ICD-10-CM | POA: Diagnosis not present

## 2015-02-21 DIAGNOSIS — M549 Dorsalgia, unspecified: Secondary | ICD-10-CM | POA: Diagnosis not present

## 2015-02-21 DIAGNOSIS — E440 Moderate protein-calorie malnutrition: Secondary | ICD-10-CM | POA: Diagnosis not present

## 2015-02-21 DIAGNOSIS — M21371 Foot drop, right foot: Secondary | ICD-10-CM | POA: Diagnosis not present

## 2015-02-21 DIAGNOSIS — C412 Malignant neoplasm of vertebral column: Secondary | ICD-10-CM | POA: Diagnosis not present

## 2015-02-21 LAB — CHROMOSOME ANALYSIS, BONE MARROW

## 2015-02-22 ENCOUNTER — Other Ambulatory Visit: Payer: Self-pay | Admitting: Oncology

## 2015-02-22 ENCOUNTER — Telehealth: Payer: Self-pay | Admitting: Oncology

## 2015-02-22 ENCOUNTER — Ambulatory Visit (HOSPITAL_COMMUNITY): Admission: RE | Admit: 2015-02-22 | Payer: Medicare Other | Source: Ambulatory Visit

## 2015-02-22 ENCOUNTER — Inpatient Hospital Stay (HOSPITAL_COMMUNITY): Admission: RE | Admit: 2015-02-22 | Payer: Medicare Other | Source: Ambulatory Visit

## 2015-02-22 ENCOUNTER — Encounter (HOSPITAL_COMMUNITY)
Admission: RE | Admit: 2015-02-22 | Discharge: 2015-02-22 | Disposition: A | Discharge status: Home / Self Care | Payer: Medicare Other | Source: Ambulatory Visit | Attending: Oncology | Admitting: Oncology

## 2015-02-22 DIAGNOSIS — E440 Moderate protein-calorie malnutrition: Secondary | ICD-10-CM | POA: Diagnosis not present

## 2015-02-22 DIAGNOSIS — C801 Malignant (primary) neoplasm, unspecified: Secondary | ICD-10-CM | POA: Diagnosis not present

## 2015-02-22 DIAGNOSIS — C7951 Secondary malignant neoplasm of bone: Secondary | ICD-10-CM

## 2015-02-22 DIAGNOSIS — C61 Malignant neoplasm of prostate: Secondary | ICD-10-CM | POA: Diagnosis not present

## 2015-02-22 DIAGNOSIS — C412 Malignant neoplasm of vertebral column: Secondary | ICD-10-CM | POA: Diagnosis not present

## 2015-02-22 DIAGNOSIS — M21371 Foot drop, right foot: Secondary | ICD-10-CM | POA: Diagnosis not present

## 2015-02-22 DIAGNOSIS — K219 Gastro-esophageal reflux disease without esophagitis: Secondary | ICD-10-CM | POA: Diagnosis not present

## 2015-02-22 DIAGNOSIS — N400 Enlarged prostate without lower urinary tract symptoms: Secondary | ICD-10-CM | POA: Diagnosis not present

## 2015-02-22 DIAGNOSIS — M549 Dorsalgia, unspecified: Secondary | ICD-10-CM | POA: Diagnosis not present

## 2015-02-22 LAB — GLUCOSE, CAPILLARY: Glucose-Capillary: 105 mg/dL — ABNORMAL HIGH (ref 65–99)

## 2015-02-22 MED ORDER — FLUDEOXYGLUCOSE F - 18 (FDG) INJECTION
7.8400 | Freq: Once | INTRAVENOUS | Status: DC | PRN
  Administered 2015-02-22: 7.84 via INTRAVENOUS
  Filled 2015-02-22: qty 7.84

## 2015-02-22 NOTE — Telephone Encounter (Signed)
S/w pt's wife confirming MD visit on 08/24 per 08/19 POF, pt also states that pt has an apt with Dr, Excell Seltzer, his office has already contacted pt... KJ

## 2015-02-25 ENCOUNTER — Telehealth: Payer: Self-pay | Admitting: *Deleted

## 2015-02-25 ENCOUNTER — Other Ambulatory Visit: Payer: Self-pay | Admitting: Oncology

## 2015-02-25 DIAGNOSIS — C7951 Secondary malignant neoplasm of bone: Secondary | ICD-10-CM

## 2015-02-25 NOTE — Telephone Encounter (Signed)
Called  Patient per Med Onc in basket from [facility] [facility], asked Dr. Lisbeth Renshaw if he wanted to refill the OXI IR5mg  , last written 02/18/15 for 120tabs , called patient per MD , spoke with wife, "He's taking 2 tabs every 3-3.5 hours for breakthrough pain in his foot, ", informed her  That rx reads 1-2 tabs every 4 hours as needed , "well he is going to run out tonight, can he take the hydrodcodone in the mean time?", asked her if the 12 hour Pain rx was helping as yet that he may not need 2 tabs of the OXY IR every 4hours,does he feel he needs them then or is his pain that great?", wife stated it comes and goes pain in foot you never know<will discuss again with Dr. Lisbeth Renshaw, she will have patient take the hydrocodone for now for breakthrough pain 10:27 AM

## 2015-02-25 NOTE — Telephone Encounter (Signed)
Spouse called.  "My husband is going to run out of medication tonight and needs a refill.  Gave name of pain medicine.  Oxycodone 5 mg was last filled by Dr. Lisbeth Renshaw.  Call transferred to RT.

## 2015-02-26 ENCOUNTER — Telehealth: Payer: Self-pay | Admitting: Oncology

## 2015-02-26 DIAGNOSIS — N400 Enlarged prostate without lower urinary tract symptoms: Secondary | ICD-10-CM | POA: Diagnosis not present

## 2015-02-26 DIAGNOSIS — M549 Dorsalgia, unspecified: Secondary | ICD-10-CM | POA: Diagnosis not present

## 2015-02-26 DIAGNOSIS — C412 Malignant neoplasm of vertebral column: Secondary | ICD-10-CM | POA: Diagnosis not present

## 2015-02-26 DIAGNOSIS — E440 Moderate protein-calorie malnutrition: Secondary | ICD-10-CM | POA: Diagnosis not present

## 2015-02-26 DIAGNOSIS — K219 Gastro-esophageal reflux disease without esophagitis: Secondary | ICD-10-CM | POA: Diagnosis not present

## 2015-02-26 DIAGNOSIS — M21371 Foot drop, right foot: Secondary | ICD-10-CM | POA: Diagnosis not present

## 2015-02-26 NOTE — Telephone Encounter (Signed)
Faxed records to Dr. Lear Ng office

## 2015-02-26 NOTE — Telephone Encounter (Signed)
Confirmed appointment for labs 08/24

## 2015-02-27 ENCOUNTER — Other Ambulatory Visit (HOSPITAL_BASED_OUTPATIENT_CLINIC_OR_DEPARTMENT_OTHER): Payer: Medicare Other

## 2015-02-27 ENCOUNTER — Ambulatory Visit (HOSPITAL_BASED_OUTPATIENT_CLINIC_OR_DEPARTMENT_OTHER): Payer: Medicare Other | Admitting: Oncology

## 2015-02-27 ENCOUNTER — Other Ambulatory Visit: Payer: Self-pay | Admitting: *Deleted

## 2015-02-27 ENCOUNTER — Telehealth: Payer: Self-pay | Admitting: Oncology

## 2015-02-27 VITALS — BP 116/74 | HR 77 | Temp 98.0°F | Resp 18 | Ht 72.0 in | Wt 156.0 lb

## 2015-02-27 DIAGNOSIS — M4854XA Collapsed vertebra, not elsewhere classified, thoracic region, initial encounter for fracture: Secondary | ICD-10-CM | POA: Diagnosis not present

## 2015-02-27 DIAGNOSIS — D696 Thrombocytopenia, unspecified: Secondary | ICD-10-CM | POA: Diagnosis not present

## 2015-02-27 DIAGNOSIS — M899 Disorder of bone, unspecified: Secondary | ICD-10-CM

## 2015-02-27 DIAGNOSIS — M21371 Foot drop, right foot: Secondary | ICD-10-CM

## 2015-02-27 DIAGNOSIS — C7951 Secondary malignant neoplasm of bone: Secondary | ICD-10-CM

## 2015-02-27 DIAGNOSIS — L539 Erythematous condition, unspecified: Secondary | ICD-10-CM

## 2015-02-27 LAB — CBC WITH DIFFERENTIAL/PLATELET
BASO%: 0.3 % (ref 0.0–2.0)
BASOS ABS: 0 10*3/uL (ref 0.0–0.1)
EOS ABS: 0 10*3/uL (ref 0.0–0.5)
EOS%: 0.4 % (ref 0.0–7.0)
HCT: 44.4 % (ref 38.4–49.9)
HGB: 15 g/dL (ref 13.0–17.1)
LYMPH%: 6.6 % — AB (ref 14.0–49.0)
MCH: 30.7 pg (ref 27.2–33.4)
MCHC: 33.8 g/dL (ref 32.0–36.0)
MCV: 91 fL (ref 79.3–98.0)
MONO#: 0.5 10*3/uL (ref 0.1–0.9)
MONO%: 6.6 % (ref 0.0–14.0)
NEUT#: 6.5 10*3/uL (ref 1.5–6.5)
NEUT%: 86.1 % — ABNORMAL HIGH (ref 39.0–75.0)
PLATELETS: 80 10*3/uL — AB (ref 140–400)
RBC: 4.88 10*6/uL (ref 4.20–5.82)
RDW: 14.4 % (ref 11.0–14.6)
WBC: 7.5 10*3/uL (ref 4.0–10.3)
lymph#: 0.5 10*3/uL — ABNORMAL LOW (ref 0.9–3.3)
nRBC: 0 % (ref 0–0)

## 2015-02-27 LAB — TECHNOLOGIST REVIEW

## 2015-02-27 LAB — LACTATE DEHYDROGENASE (CC13): LDH: 350 U/L — ABNORMAL HIGH (ref 125–245)

## 2015-02-27 MED ORDER — HYDROMORPHONE HCL 4 MG PO TABS: 4.0000 mg | ORAL_TABLET | ORAL | Status: DC | PRN

## 2015-02-27 NOTE — Telephone Encounter (Signed)
Pt confirmed MD visit per 08/24 POF, gave pt AVS and Calendar... KJ

## 2015-02-27 NOTE — Progress Notes (Signed)
  [place] [facility] OFFICE PROGRESS NOTE   Diagnosis: Malignant bone lesions  INTERVAL HISTORY:   [name] returns as scheduled. He continues to have pain at the right foot. The pain is not relieved with OxyContin. He is taking up to 2 oxycodone approximately every 3 hours. He is having bowel movements. No fever or sweats. He reports anorexia. No pain in other sites. He continues home physical therapy. He is ambulating in the home with a walker. He has some movement with dorsi flexion at the right foot.  Objective:  Vital signs in last 24 hours:  Blood pressure 116/74, pulse 77, temperature 98 F (36.7 C), temperature source Oral, resp. rate 18, height 6' (1.829 m), weight 156 lb (70.761 kg), SpO2 100 %.    HEENT: Neck without mass Lymphatics: No cervical, supraclavicular, or inguinal nodes. 1/2 cm soft mobile bilateral axillary node versus prominent fat and Resp: Bronchial sounds at the upper posterior chest, no respiratory distress Cardio: Regular rate and rhythm GI: No hepatosplenomegaly, nontender, no mass Vascular: No leg edema Neuro: He can ambulate  Skin: Erythematous maculopapular rash in the groin bilaterally and at the lower back. No vesicles.    Lab Results:  Lab Results  Component Value Date   WBC 7.5 02/27/2015   HGB 15.0 02/27/2015   HCT 44.4 02/27/2015   MCV 91.0 02/27/2015   PLT 80* 02/27/2015   NEUTROABS 6.5 02/27/2015    LDH-350   Medications: I have reviewed the patient's current medications.  Assessment/Plan: 1. Malignant-appearing bone lesions, biopsy of the right sacral mass on 02/04/2015 consistent with a hematopoietic neoplasm  Bone marrow biopsy 02/12/2015-no evidence of malignancy  Repeat sacral mass biopsy 02/19/2015-necrotic tissue, nondiagnostic  PET scan 00/92/[id number] hypermetabolic bone lesions throughout the axial and appendicular skeleton, hypermetabolic spleen lesion 2. Thrombocytopenia 3. T7 compression  fracture-likely pathologic 4. 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 Right foot pain-likely secondary to nerve compression from the sacral mass 6. Erythematous rash at the lower back and groin-yeast?, Sterile rash?   Disposition:  Perry Nielsen appears stable. The PET scan reveals numerous hypermetabolic bone lesions and a hypermetabolic lesion in the spleen. I reviewed the PET scan images with Perry Nielsen and his wife. I have discussed the case with several interventional radiologists. The bone lesions do not appear amenable to a high yield biopsy procedure. Many of the lesions do not have a CT correlate and they are without a soft tissue component. The mass at the right sacrum is not hypermetabolic.  Perry Nielsen agrees to a diagnostic splenectomy. He will see Dr. Excell Seltzer tomorrow and is scheduled for surgery next week. I think there is a high chance the splenectomy will yield a diagnosis. We discussed the risk of a splenectomy procedure and he agrees to proceed.  Perry Nielsen will be scheduled for an office visit 03/08/2015.  He will continue OxyContin. We added Dilaudid for breakthrough pain.  He will let us know if the rash becomes pruritic or progresses.  Betsy Coder, MD  02/27/2015  2:28 PM

## 2015-02-28 ENCOUNTER — Encounter (HOSPITAL_COMMUNITY): Payer: Self-pay

## 2015-02-28 ENCOUNTER — Other Ambulatory Visit: Payer: Self-pay | Admitting: General Surgery

## 2015-02-28 ENCOUNTER — Telehealth: Payer: Self-pay | Admitting: *Deleted

## 2015-02-28 DIAGNOSIS — M549 Dorsalgia, unspecified: Secondary | ICD-10-CM | POA: Diagnosis not present

## 2015-02-28 DIAGNOSIS — K219 Gastro-esophageal reflux disease without esophagitis: Secondary | ICD-10-CM | POA: Diagnosis not present

## 2015-02-28 DIAGNOSIS — C412 Malignant neoplasm of vertebral column: Secondary | ICD-10-CM | POA: Diagnosis not present

## 2015-02-28 DIAGNOSIS — R161 Splenomegaly, not elsewhere classified: Secondary | ICD-10-CM | POA: Diagnosis not present

## 2015-02-28 DIAGNOSIS — M21371 Foot drop, right foot: Secondary | ICD-10-CM | POA: Diagnosis not present

## 2015-02-28 DIAGNOSIS — N400 Enlarged prostate without lower urinary tract symptoms: Secondary | ICD-10-CM | POA: Diagnosis not present

## 2015-02-28 DIAGNOSIS — E440 Moderate protein-calorie malnutrition: Secondary | ICD-10-CM | POA: Diagnosis not present

## 2015-02-28 NOTE — Telephone Encounter (Signed)
VOICE MAIL AT 2:13PM/FORWARD CALL AT 2:20PM

## 2015-02-28 NOTE — Telephone Encounter (Signed)
Ok to order boot recommended by PT

## 2015-03-01 ENCOUNTER — Telehealth: Payer: Self-pay | Admitting: *Deleted

## 2015-03-01 ENCOUNTER — Telehealth: Payer: Self-pay

## 2015-03-01 ENCOUNTER — Ambulatory Visit (INDEPENDENT_AMBULATORY_CARE_PROVIDER_SITE_OTHER): Payer: Medicare Other | Admitting: *Deleted

## 2015-03-01 DIAGNOSIS — Z23 Encounter for immunization: Secondary | ICD-10-CM | POA: Diagnosis not present

## 2015-03-01 NOTE — Telephone Encounter (Signed)
Pt. Was discharged from [facility] yesterday. 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 Physical therapy recommended Cam Boot. See note from 02-28-15.   Roselyn Reef stated that [facility] Does not carry a cam boot.  He recommended  Orders for the boot be sent to [facility] or [facility].

## 2015-03-01 NOTE — Patient Instructions (Addendum)
YOUR PROCEDURE IS SCHEDULED ON : 03/05/15  REPORT TO [facility] MAIN ENTRANCE FOLLOW SIGNS TO EAST ELEVATOR - GO TO 3rd FLOOR CHECK IN AT 3 EAST NURSES STATION (SHORT STAY) AT: 6:30 AM  CALL THIS NUMBER IF YOU HAVE PROBLEMS THE MORNING OF SURGERY [phone]  REMEMBER:ONLY 1 PER PERSON MAY GO TO SHORT STAY WITH YOU TO GET READY THE MORNING OF YOUR SURGERY  DO NOT EAT FOOD OR DRINK LIQUIDS AFTER MIDNIGHT  TAKE THESE MEDICINES THE MORNING OF SURGERY: DECADRON / MAY TAKE EITHER HYDROMORPHONE OR OXYCODONE IF NEEDED  STOP ASPIRIN / IBUPROFEN / ALEVE / VITAMINS / HERBAL MEDS __5__ DAYS BEFORE SURGERY  YOU MAY NOT HAVE ANY METAL ON YOUR BODY INCLUDING HAIR PINS AND PIERCING'S. DO NOT WEAR JEWELRY, MAKEUP, LOTIONS, POWDERS OR PERFUMES. DO NOT WEAR NAIL POLISH. DO NOT SHAVE 48 HRS PRIOR TO SURGERY. MEN MAY SHAVE FACE AND NECK.  DO NOT [place]. [facility] IS NOT RESPONSIBLE FOR VALUABLES.  CONTACTS, DENTURES OR PARTIALS MAY NOT BE WORN TO SURGERY. LEAVE SUITCASE IN CAR. CAN BE BROUGHT TO ROOM AFTER SURGERY.  PATIENTS DISCHARGED THE DAY OF SURGERY WILL NOT BE ALLOWED TO DRIVE HOME.  PLEASE READ OVER THE FOLLOWING INSTRUCTION SHEETS _________________________________________________________________________________                                          [facility] - PREPARING FOR SURGERY  Before surgery, you can play an important role.  Because skin is not sterile, your skin needs to be as free of germs as possible.  You can reduce the number of germs on your skin by washing with CHG (chlorahexidine gluconate) soap before surgery.  CHG is an antiseptic cleaner which kills germs and bonds with the skin to continue killing germs even after washing. Please DO NOT use if you have an allergy to CHG or antibacterial soaps.  If your skin becomes reddened/irritated stop using the CHG and inform your nurse when you arrive at Short Stay. Do not shave (including  legs and underarms) for at least 48 hours prior to the first CHG shower.  You may shave your face. Please follow these instructions carefully:   1.  Shower with CHG Soap the night before surgery and the  morning of Surgery.   2.  If you choose to wash your hair, wash your hair first as usual with your  normal  Shampoo.   3.  After you shampoo, rinse your hair and body thoroughly to remove the  shampoo.                                         4.  Use CHG as you would any other liquid soap.  You can apply chg directly  to the skin and wash . Gently wash with scrungie or clean wascloth    5.  Apply the CHG Soap to your body ONLY FROM THE NECK DOWN.   Do not use on open                           Wound or open sores. Avoid contact with eyes, ears mouth and genitals (private parts).  Genitals (private parts) with your normal soap.              6.  Wash thoroughly, paying special attention to the area where your surgery  will be performed.   7.  Thoroughly rinse your body with warm water from the neck down.   8.  DO NOT shower/wash with your normal soap after using and rinsing off  the CHG Soap .                9.  Pat yourself dry with a clean towel.             10.  Wear clean night clothes to bed after shower             11.  Place clean sheets on your bed the night of your first shower and do not  sleep with pets.  Day of Surgery : Do not apply any lotions/deodorants the morning of surgery.  Please wear clean clothes to the hospital/surgery center.  FAILURE TO FOLLOW THESE INSTRUCTIONS MAY RESULT IN THE CANCELLATION OF YOUR SURGERY    PATIENT SIGNATURE_________________________________  ______________________________________________________________________

## 2015-03-01 NOTE — Telephone Encounter (Signed)
Patient needs a Hib vaccine for surgery Tuesday. Patient coming in today to get vaccinee.

## 2015-03-01 NOTE — Telephone Encounter (Signed)
LEFT MESSAGE ON CATHY CARGO'S VOICE MAIL THAT IT IS OK TO ORDER THE BOOT RECOMMENDED BY PHYSICAL THERAPY PER DR.SHERRILL.

## 2015-03-04 ENCOUNTER — Encounter (HOSPITAL_COMMUNITY)
Admission: RE | Admit: 2015-03-04 | Discharge: 2015-03-04 | Disposition: A | Discharge status: Home / Self Care | Payer: Medicare Other | Source: Ambulatory Visit | Attending: General Surgery | Admitting: General Surgery

## 2015-03-04 ENCOUNTER — Telehealth: Payer: Self-pay | Admitting: *Deleted

## 2015-03-04 ENCOUNTER — Ambulatory Visit (HOSPITAL_BASED_OUTPATIENT_CLINIC_OR_DEPARTMENT_OTHER): Payer: Medicare Other | Admitting: Nurse Practitioner

## 2015-03-04 ENCOUNTER — Other Ambulatory Visit: Payer: Self-pay | Admitting: *Deleted

## 2015-03-04 ENCOUNTER — Encounter (INDEPENDENT_AMBULATORY_CARE_PROVIDER_SITE_OTHER): Payer: Self-pay

## 2015-03-04 ENCOUNTER — Encounter (HOSPITAL_COMMUNITY): Payer: Self-pay

## 2015-03-04 VITALS — BP 120/75 | HR 72 | Temp 98.4°F | Resp 17 | Ht 72.0 in

## 2015-03-04 DIAGNOSIS — C7951 Secondary malignant neoplasm of bone: Secondary | ICD-10-CM

## 2015-03-04 DIAGNOSIS — M8448XA Pathological fracture, other site, initial encounter for fracture: Secondary | ICD-10-CM | POA: Diagnosis not present

## 2015-03-04 DIAGNOSIS — K219 Gastro-esophageal reflux disease without esophagitis: Secondary | ICD-10-CM | POA: Diagnosis not present

## 2015-03-04 DIAGNOSIS — H9190 Unspecified hearing loss, unspecified ear: Secondary | ICD-10-CM | POA: Diagnosis not present

## 2015-03-04 DIAGNOSIS — L270 Generalized skin eruption due to drugs and medicaments taken internally: Secondary | ICD-10-CM | POA: Diagnosis not present

## 2015-03-04 DIAGNOSIS — C8337 Diffuse large B-cell lymphoma, spleen: Secondary | ICD-10-CM | POA: Diagnosis not present

## 2015-03-04 DIAGNOSIS — T50905A Adverse effect of unspecified drugs, medicaments and biological substances, initial encounter: Secondary | ICD-10-CM | POA: Diagnosis not present

## 2015-03-04 DIAGNOSIS — Z6821 Body mass index (BMI) 21.0-21.9, adult: Secondary | ICD-10-CM | POA: Diagnosis not present

## 2015-03-04 DIAGNOSIS — E876 Hypokalemia: Secondary | ICD-10-CM | POA: Diagnosis not present

## 2015-03-04 DIAGNOSIS — Z01812 Encounter for preprocedural laboratory examination: Secondary | ICD-10-CM | POA: Diagnosis not present

## 2015-03-04 DIAGNOSIS — R21 Rash and other nonspecific skin eruption: Secondary | ICD-10-CM | POA: Diagnosis not present

## 2015-03-04 DIAGNOSIS — Z79899 Other long term (current) drug therapy: Secondary | ICD-10-CM | POA: Diagnosis not present

## 2015-03-04 DIAGNOSIS — M21371 Foot drop, right foot: Secondary | ICD-10-CM | POA: Diagnosis not present

## 2015-03-04 DIAGNOSIS — R000 Tachycardia, unspecified: Secondary | ICD-10-CM | POA: Diagnosis not present

## 2015-03-04 DIAGNOSIS — N400 Enlarged prostate without lower urinary tract symptoms: Secondary | ICD-10-CM | POA: Diagnosis not present

## 2015-03-04 DIAGNOSIS — Z8042 Family history of malignant neoplasm of prostate: Secondary | ICD-10-CM | POA: Diagnosis not present

## 2015-03-04 DIAGNOSIS — Z7982 Long term (current) use of aspirin: Secondary | ICD-10-CM | POA: Diagnosis not present

## 2015-03-04 DIAGNOSIS — Z8049 Family history of malignant neoplasm of other genital organs: Secondary | ICD-10-CM | POA: Diagnosis not present

## 2015-03-04 DIAGNOSIS — D696 Thrombocytopenia, unspecified: Secondary | ICD-10-CM | POA: Diagnosis not present

## 2015-03-04 DIAGNOSIS — R197 Diarrhea, unspecified: Secondary | ICD-10-CM | POA: Diagnosis not present

## 2015-03-04 DIAGNOSIS — K759 Inflammatory liver disease, unspecified: Secondary | ICD-10-CM | POA: Diagnosis not present

## 2015-03-04 DIAGNOSIS — L539 Erythematous condition, unspecified: Secondary | ICD-10-CM | POA: Diagnosis not present

## 2015-03-04 DIAGNOSIS — D72829 Elevated white blood cell count, unspecified: Secondary | ICD-10-CM | POA: Diagnosis not present

## 2015-03-04 DIAGNOSIS — Z8249 Family history of ischemic heart disease and other diseases of the circulatory system: Secondary | ICD-10-CM | POA: Diagnosis not present

## 2015-03-04 DIAGNOSIS — R110 Nausea: Secondary | ICD-10-CM | POA: Diagnosis not present

## 2015-03-04 DIAGNOSIS — R634 Abnormal weight loss: Secondary | ICD-10-CM | POA: Diagnosis not present

## 2015-03-04 HISTORY — DX: Sleep disorder, unspecified: G47.9

## 2015-03-04 HISTORY — DX: Non-Hodgkin lymphoma, unspecified, unspecified site: C85.90

## 2015-03-04 HISTORY — DX: Inflammatory liver disease, unspecified: K75.9

## 2015-03-04 HISTORY — DX: Rash and other nonspecific skin eruption: R21

## 2015-03-04 HISTORY — DX: Foot drop, unspecified foot: M21.379

## 2015-03-04 LAB — CBC
HCT: 43.1 % (ref 39.0–52.0)
Hemoglobin: 14 g/dL (ref 13.0–17.0)
MCH: 30.2 pg (ref 26.0–34.0)
MCHC: 32.5 g/dL (ref 30.0–36.0)
MCV: 93.1 fL (ref 78.0–100.0)
PLATELETS: 76 10*3/uL — AB (ref 150–400)
RBC: 4.63 MIL/uL (ref 4.22–5.81)
RDW: 14.4 % (ref 11.5–15.5)
WBC: 6 10*3/uL (ref 4.0–10.5)

## 2015-03-04 LAB — BASIC METABOLIC PANEL
Anion gap: 7 (ref 5–15)
BUN: 19 mg/dL (ref 6–20)
CALCIUM: 8.8 mg/dL — AB (ref 8.9–10.3)
CO2: 30 mmol/L (ref 22–32)
CREATININE: 0.85 mg/dL (ref 0.61–1.24)
Chloride: 100 mmol/L — ABNORMAL LOW (ref 101–111)
GFR calc Af Amer: >60 mL/min (ref >60)
GLUCOSE: 90 mg/dL (ref 65–99)
POTASSIUM: 4.4 mmol/L (ref 3.5–5.1)
SODIUM: 137 mmol/L (ref 135–145)

## 2015-03-04 LAB — ABO/RH: ABO/RH(D): A POS

## 2015-03-04 NOTE — H&P (Signed)
History of Present Illness Perry Nielsen T. Perry Blackston MD; 02/28/2015 10:58 AM) Patient words: spleen mass.  The patient is a 74 year old male who presents with a diagnosis of cancer. Patient is a very pleasant 74 year old male referred by Dr. Benay Spice. He has a several week illness presenting with right hip and then severe right foot pain and foot drop. Subsequent evaluation revealed multiple bony lesions including a large lesion in the sacrum. He subsequently has had biopsy of the sacral mass as well as a bone marrow biopsy with concerns over a hematopoietic malignancy but no diagnosis was able to be made due to paucity of viable tissue. Subsequent PET CT has been performed showing the bony lesions but also an approximately 4 cm mass in the spleen with some central necrosis but also viable tissue felt to represent very likely metastatic disease. We've been asked to see the patient to consider splenectomy for diagnosis. His main complaint is foot pain and foot drop and otherwise has been in relatively good health up to this illness and has no other complaints currently.   Other Problems Perry Nielsen, CMA; 02/28/2015 10:01 AM) Enlarged Prostate Hepatitis  Past Surgical History Perry Nielsen, CMA; 02/28/2015 10:01 AM) Colon Polyp Removal - Colonoscopy Colon Polyp Removal - Open Shoulder Surgery Right.  Diagnostic Studies History Perry Nielsen, CMA; 02/28/2015 10:01 AM) Colonoscopy 1-5 years ago  Allergies Perry Nielsen, CMA; 02/28/2015 10:03 AM) Antihistamine *ANTIHISTAMINES*  Medication History (Perry Nielsen, CMA; 02/28/2015 10:05 AM) OxyCONTIN (10MG Tab 12HR Deter, Oral) Active. TraZODone HCl (150MG Tablet, Oral) Active. Sorbitol (70% Solution,) Active. Dexamethasone (4MG Tablet, Oral) Active. Meloxicam (15MG Tablet, Oral) Active. Viagra (100MG Tablet, Oral) Active. Dilaudid (4MG Tablet, Oral) Active. Aspirin (81MG Tablet Chewable, Oral) Active. Fish Oil + D3 (1000-1000MG-UNIT  Capsule, Oral) Active. Flonase (50MCG/ACT Suspension, Nasal) Active. Magnesium (200MG Tablet, Oral) Active. Medications Reconciled  Social History Perry Nielsen, CMA; 02/28/2015 10:01 AM) Alcohol use Occasional alcohol use. Caffeine use Carbonated beverages, Coffee. No drug use Tobacco use Never smoker.  Family History Perry Nielsen, CMA; 02/28/2015 10:01 AM) Cervical Cancer Mother. Heart Disease Father. Prostate Cancer Father.  Review of Systems ([name]; 02/28/2015 10:01 AM) General Present- Weight Loss. Not Present- Appetite Loss, Chills, Fatigue, Fever, Night Sweats and Weight Gain. Skin Present- Rash. Not Present- Change in Wart/Mole, Dryness, Hives, Jaundice, New Lesions, Non-Healing Wounds and Ulcer. HEENT Present- Hearing Loss. Not Present- Earache, Hoarseness, Nose Bleed, Oral Ulcers, Ringing in the Ears, Seasonal Allergies, Sinus Pain, Sore Throat, Visual Disturbances, Wears glasses/contact lenses and Yellow Eyes. Respiratory Not Present- Bloody sputum, Chronic Cough, Difficulty Breathing, Snoring and Wheezing. Breast Not Present- Breast Mass, Breast Pain, Nipple Discharge and Skin Changes. Cardiovascular Not Present- Chest Pain, Difficulty Breathing Lying Down, Leg Cramps, Palpitations, Rapid Heart Rate, Shortness of Breath and Swelling of Extremities. Gastrointestinal Present- Constipation. Not Present- Abdominal Pain, Bloating, Bloody Stool, Change in Bowel Habits, Chronic diarrhea, Difficulty Swallowing, Excessive gas, Gets full quickly at meals, Hemorrhoids, Indigestion, Nausea, Rectal Pain and Vomiting. Musculoskeletal Present- Back Pain. Not Present- Joint Pain, Joint Stiffness, Muscle Pain, Muscle Weakness and Swelling of Extremities. Neurological Present- Trouble walking. Not Present- Decreased Memory, Fainting, Headaches, Numbness, Seizures, Tingling, Tremor and Weakness. Psychiatric Not Present- Anxiety, Bipolar, Change in Sleep Pattern, Depression, Fearful  and Frequent crying. Endocrine Not Present- Cold Intolerance, Excessive Hunger, Hair Changes, Heat Intolerance, Hot flashes and New Diabetes. Hematology Present- Easy Bruising. Not Present- Excessive bleeding, Gland problems, HIV and Persistent Infections.   Vitals (Perry Nielsen CMA; 02/28/2015 10:02 AM)  02/28/2015 10:02 AM Weight: 156.6 lb Height: 72in Body Surface Area: 1.9 m Body Mass Index: 21.24 kg/m Temp.: 98.25F(Temporal)  Pulse: 79 (Regular)  BP: 128/76 (Sitting, Left Arm, Standard)    Physical Exam Perry Nielsen T. Olaoluwa Grieder MD; 02/28/2015 10:59 AM) The physical exam findings are as follows: Note:General: Alert, thin but not unwell-appearing Caucasian male, in no distress Skin: Warm and dry without rash or infection. HEENT: No palpable masses or thyromegaly. Sclera nonicteric. Lymph nodes: No cervical, supraclavicular, or inguinal nodes palpable. Lungs: Breath sounds clear and equal. No wheezing or increased work of breathing. Cardiovascular: Regular rate and rhythm without murmer. No JVD or edema. Peripheral pulses intact. No carotid bruits. Abdomen: Nondistended. Soft and nontender. No masses palpable. No organomegaly. No palpable hernias. Extremities: No edema or joint swelling or deformity. Right foot in a brace due to foot drop Neurologic: Alert and fully oriented. Right foot drop Psychiatric: Normal mood and affect. Thought content appropriate with normal judgement and insight    Assessment & Plan Perry Nielsen T. 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 This has been discussed with Dr. Benay Spice. We feel that the best opportunity to obtain tissue diagnosis and direct therapy would be obtain tissue from the spleen. Interventional radiology understandably does not want to do a core biopsy  of the spleen and I think this is risky and may not yield enough tissue. I think laparoscopic splenectomy is reasonable to obtain diagnosis. I discussed this with the patient and his wife today. We discussed the nature of the surgery and expected recovery. We discussed risks of surgery including anesthetic complications, bleeding, infection, possible need for open surgery and risks of various infections after splenectomy. He is given a prescription for vaccines. He is in strong agreement with surgery. Current Plans  Schedule for Surgery Laparoscopic splenectomy

## 2015-03-04 NOTE — Telephone Encounter (Signed)
ON 03/01/15 RECEIVED A MESSAGE FROM CATHY CARGO TO SEND A PRESCRIPTION TO [facility] AT FAX NUMBER [phone] FOR A RIGHT LOWER EXTREMITY CAM BOOT.

## 2015-03-04 NOTE — Progress Notes (Signed)
Spoke with Dr.Hoxworth - informed him of pt platelet count . No action needed at this time. Pt had T/S done today

## 2015-03-04 NOTE — Telephone Encounter (Signed)
Spoke with pt's wife: Discontinue Oxycontin, per Dr. Benay Spice, this may be causing rash. Use Dilaudid as needed for pain. Mrs. Allen voiced understanding.

## 2015-03-04 NOTE — Telephone Encounter (Signed)
Pt came in to fill out a walk in form- Rash has spread and become itchy, covering torso.  Pt agrees to see Selena Lesser, NP in [facility]

## 2015-03-05 ENCOUNTER — Encounter: Payer: Self-pay | Admitting: Nurse Practitioner

## 2015-03-05 ENCOUNTER — Inpatient Hospital Stay (HOSPITAL_COMMUNITY): Payer: Medicare Other | Admitting: Registered Nurse

## 2015-03-05 ENCOUNTER — Inpatient Hospital Stay (HOSPITAL_COMMUNITY)
Admission: RE | Admit: 2015-03-05 | Discharge: 2015-03-10 | DRG: 821 | Disposition: A | Discharge status: Home / Self Care | Payer: Medicare Other | Source: Ambulatory Visit | Attending: General Surgery | Admitting: General Surgery

## 2015-03-05 ENCOUNTER — Encounter (HOSPITAL_COMMUNITY)
Admission: RE | Disposition: A | Discharge status: Home / Self Care | Payer: Self-pay | Source: Ambulatory Visit | Attending: General Surgery

## 2015-03-05 ENCOUNTER — Encounter (HOSPITAL_COMMUNITY): Payer: Self-pay | Admitting: *Deleted

## 2015-03-05 DIAGNOSIS — R197 Diarrhea, unspecified: Secondary | ICD-10-CM | POA: Diagnosis not present

## 2015-03-05 DIAGNOSIS — M8448XA Pathological fracture, other site, initial encounter for fracture: Secondary | ICD-10-CM | POA: Diagnosis not present

## 2015-03-05 DIAGNOSIS — K759 Inflammatory liver disease, unspecified: Secondary | ICD-10-CM | POA: Diagnosis not present

## 2015-03-05 DIAGNOSIS — Z01812 Encounter for preprocedural laboratory examination: Secondary | ICD-10-CM

## 2015-03-05 DIAGNOSIS — M21371 Foot drop, right foot: Secondary | ICD-10-CM | POA: Diagnosis present

## 2015-03-05 DIAGNOSIS — C8337 Diffuse large B-cell lymphoma, spleen: Secondary | ICD-10-CM | POA: Diagnosis not present

## 2015-03-05 DIAGNOSIS — Z8249 Family history of ischemic heart disease and other diseases of the circulatory system: Secondary | ICD-10-CM | POA: Diagnosis not present

## 2015-03-05 DIAGNOSIS — H9190 Unspecified hearing loss, unspecified ear: Secondary | ICD-10-CM | POA: Diagnosis present

## 2015-03-05 DIAGNOSIS — R21 Rash and other nonspecific skin eruption: Secondary | ICD-10-CM | POA: Insufficient documentation

## 2015-03-05 DIAGNOSIS — Z8042 Family history of malignant neoplasm of prostate: Secondary | ICD-10-CM | POA: Diagnosis not present

## 2015-03-05 DIAGNOSIS — R634 Abnormal weight loss: Secondary | ICD-10-CM | POA: Diagnosis present

## 2015-03-05 DIAGNOSIS — R5082 Postprocedural fever: Secondary | ICD-10-CM

## 2015-03-05 DIAGNOSIS — R509 Fever, unspecified: Secondary | ICD-10-CM | POA: Diagnosis not present

## 2015-03-05 DIAGNOSIS — L539 Erythematous condition, unspecified: Secondary | ICD-10-CM | POA: Diagnosis present

## 2015-03-05 DIAGNOSIS — C8580 Other specified types of non-Hodgkin lymphoma, unspecified site: Secondary | ICD-10-CM

## 2015-03-05 DIAGNOSIS — Z8049 Family history of malignant neoplasm of other genital organs: Secondary | ICD-10-CM

## 2015-03-05 DIAGNOSIS — E440 Moderate protein-calorie malnutrition: Secondary | ICD-10-CM | POA: Diagnosis not present

## 2015-03-05 DIAGNOSIS — L270 Generalized skin eruption due to drugs and medicaments taken internally: Secondary | ICD-10-CM | POA: Diagnosis present

## 2015-03-05 DIAGNOSIS — T50905A Adverse effect of unspecified drugs, medicaments and biological substances, initial encounter: Secondary | ICD-10-CM | POA: Diagnosis present

## 2015-03-05 DIAGNOSIS — D696 Thrombocytopenia, unspecified: Secondary | ICD-10-CM | POA: Diagnosis not present

## 2015-03-05 DIAGNOSIS — Z79899 Other long term (current) drug therapy: Secondary | ICD-10-CM

## 2015-03-05 DIAGNOSIS — Z7982 Long term (current) use of aspirin: Secondary | ICD-10-CM

## 2015-03-05 DIAGNOSIS — D72829 Elevated white blood cell count, unspecified: Secondary | ICD-10-CM | POA: Diagnosis not present

## 2015-03-05 DIAGNOSIS — Z6821 Body mass index (BMI) 21.0-21.9, adult: Secondary | ICD-10-CM

## 2015-03-05 DIAGNOSIS — M4854XA Collapsed vertebra, not elsewhere classified, thoracic region, initial encounter for fracture: Secondary | ICD-10-CM | POA: Diagnosis not present

## 2015-03-05 DIAGNOSIS — R000 Tachycardia, unspecified: Secondary | ICD-10-CM | POA: Diagnosis not present

## 2015-03-05 DIAGNOSIS — E876 Hypokalemia: Secondary | ICD-10-CM | POA: Diagnosis not present

## 2015-03-05 DIAGNOSIS — K219 Gastro-esophageal reflux disease without esophagitis: Secondary | ICD-10-CM | POA: Diagnosis present

## 2015-03-05 DIAGNOSIS — R161 Splenomegaly, not elsewhere classified: Secondary | ICD-10-CM | POA: Diagnosis not present

## 2015-03-05 DIAGNOSIS — N400 Enlarged prostate without lower urinary tract symptoms: Secondary | ICD-10-CM | POA: Diagnosis present

## 2015-03-05 DIAGNOSIS — R110 Nausea: Secondary | ICD-10-CM | POA: Diagnosis not present

## 2015-03-05 DIAGNOSIS — Z01810 Encounter for preprocedural cardiovascular examination: Secondary | ICD-10-CM | POA: Diagnosis not present

## 2015-03-05 DIAGNOSIS — C8510 Unspecified B-cell lymphoma, unspecified site: Secondary | ICD-10-CM | POA: Diagnosis not present

## 2015-03-05 HISTORY — PX: LAPAROSCOPIC SPLENECTOMY: SHX409

## 2015-03-05 LAB — CBC
HCT: 38.9 % — ABNORMAL LOW (ref 39.0–52.0)
Hemoglobin: 12.9 g/dL — ABNORMAL LOW (ref 13.0–17.0)
MCH: 30.6 pg (ref 26.0–34.0)
MCHC: 33.2 g/dL (ref 30.0–36.0)
MCV: 92.2 fL (ref 78.0–100.0)
Platelets: 70 10*3/uL — ABNORMAL LOW (ref 150–400)
RBC: 4.22 MIL/uL (ref 4.22–5.81)
RDW: 14.3 % (ref 11.5–15.5)
WBC: 6.2 10*3/uL (ref 4.0–10.5)

## 2015-03-05 LAB — TYPE AND SCREEN
ABO/RH(D): A POS
Antibody Screen: NEGATIVE

## 2015-03-05 LAB — HEMOGLOBIN: Hemoglobin: 12 g/dL — ABNORMAL LOW (ref 13.0–17.0)

## 2015-03-05 SURGERY — SPLENECTOMY, LAPAROSCOPIC
Anesthesia: General

## 2015-03-05 MED ORDER — TISSEEL VH 10 ML EX KIT
PACK | CUTANEOUS | Status: AC
Start: 2015-03-05 — End: 2015-03-05
  Filled 2015-03-05: qty 1

## 2015-03-05 MED ORDER — DEXAMETHASONE 2 MG PO TABS
2.0000 mg | ORAL_TABLET | Freq: Every morning | ORAL | Status: DC
  Administered 2015-03-05 – 2015-03-10 (×6): 2 mg via ORAL
  Filled 2015-03-05 (×7): qty 1

## 2015-03-05 MED ORDER — ONDANSETRON 4 MG PO TBDP: 4.0000 mg | ORAL_TABLET | Freq: Four times a day (QID) | ORAL | Status: DC | PRN

## 2015-03-05 MED ORDER — PROPOFOL 10 MG/ML IV BOLUS
INTRAVENOUS | Status: AC
  Filled 2015-03-05: qty 20

## 2015-03-05 MED ORDER — EVICEL 5 ML EX KIT
PACK | CUTANEOUS | Status: DC | PRN
  Administered 2015-03-05: 1

## 2015-03-05 MED ORDER — ENOXAPARIN SODIUM 40 MG/0.4ML ~~LOC~~ SOLN
40.0000 mg | SUBCUTANEOUS | Status: DC
  Administered 2015-03-06 – 2015-03-10 (×6): 40 mg via SUBCUTANEOUS
  Filled 2015-03-05 (×7): qty 0.4

## 2015-03-05 MED ORDER — SODIUM CHLORIDE 0.9 % IJ SOLN
INTRAMUSCULAR | Status: AC
Start: 2015-03-05 — End: 2015-03-05
  Filled 2015-03-05: qty 10

## 2015-03-05 MED ORDER — DEXTROSE IN LACTATED RINGERS 5 % IV SOLN
INTRAVENOUS | Status: DC
  Administered 2015-03-05: 14:00:00 via INTRAVENOUS
  Administered 2015-03-06: 1000 mL via INTRAVENOUS
  Administered 2015-03-06 – 2015-03-09 (×5): via INTRAVENOUS
  Administered 2015-03-09: 1000 mL via INTRAVENOUS

## 2015-03-05 MED ORDER — LIDOCAINE HCL (CARDIAC) 20 MG/ML IV SOLN
INTRAVENOUS | Status: AC
  Filled 2015-03-05: qty 5

## 2015-03-05 MED ORDER — MIDAZOLAM HCL 2 MG/2ML IJ SOLN
INTRAMUSCULAR | Status: AC
Start: 2015-03-05 — End: 2015-03-05
  Filled 2015-03-05: qty 4

## 2015-03-05 MED ORDER — TRAZODONE HCL 150 MG PO TABS
150.0000 mg | ORAL_TABLET | Freq: Every day | ORAL | Status: DC
  Administered 2015-03-05 – 2015-03-09 (×5): 150 mg via ORAL
  Filled 2015-03-05 (×6): qty 1

## 2015-03-05 MED ORDER — NEOSTIGMINE METHYLSULFATE 10 MG/10ML IV SOLN
INTRAVENOUS | Status: AC
  Filled 2015-03-05: qty 1

## 2015-03-05 MED ORDER — LACTATED RINGERS IV SOLN
INTRAVENOUS | Status: DC | PRN
  Administered 2015-03-05: 08:00:00 via INTRAVENOUS

## 2015-03-05 MED ORDER — FENTANYL CITRATE (PF) 250 MCG/5ML IJ SOLN
INTRAMUSCULAR | Status: AC
  Filled 2015-03-05: qty 25

## 2015-03-05 MED ORDER — CEFAZOLIN SODIUM-DEXTROSE 2-3 GM-% IV SOLR
2.0000 g | INTRAVENOUS | Status: AC
  Administered 2015-03-05: 2 g via INTRAVENOUS

## 2015-03-05 MED ORDER — LACTATED RINGERS IV SOLN
INTRAVENOUS | Status: DC | PRN
Start: 2015-03-05 — End: 2015-03-05
  Administered 2015-03-05: 1 via INTRAVENOUS

## 2015-03-05 MED ORDER — CHLORHEXIDINE GLUCONATE 4 % EX LIQD: 1.0000 "application " | Freq: Once | CUTANEOUS | Status: DC

## 2015-03-05 MED ORDER — SUCCINYLCHOLINE CHLORIDE 20 MG/ML IJ SOLN
INTRAMUSCULAR | Status: DC | PRN
  Administered 2015-03-05: 100 mg via INTRAVENOUS

## 2015-03-05 MED ORDER — OXYMETAZOLINE HCL 0.05 % NA SOLN
1.0000 | Freq: Two times a day (BID) | NASAL | Status: DC | PRN
  Filled 2015-03-05: qty 15

## 2015-03-05 MED ORDER — SORBITOL 70 % PO SOLN
30.0000 mL | Freq: Two times a day (BID) | ORAL | Status: DC
  Filled 2015-03-05 (×18): qty 30

## 2015-03-05 MED ORDER — BUPIVACAINE LIPOSOME 1.3 % IJ SUSP
20.0000 mL | Freq: Once | INTRAMUSCULAR | Status: AC
Start: 2015-03-05 — End: 2015-03-05
  Administered 2015-03-05: 20 mL
  Filled 2015-03-05: qty 20

## 2015-03-05 MED ORDER — GLYCOPYRROLATE 0.2 MG/ML IJ SOLN
INTRAMUSCULAR | Status: AC
  Filled 2015-03-05: qty 3

## 2015-03-05 MED ORDER — LIDOCAINE HCL (CARDIAC) 20 MG/ML IV SOLN
INTRAVENOUS | Status: DC | PRN
Start: 2015-03-05 — End: 2015-03-05
  Administered 2015-03-05: 100 mg via INTRAVENOUS

## 2015-03-05 MED ORDER — BUPIVACAINE-EPINEPHRINE 0.25% -1:200000 IJ SOLN
INTRAMUSCULAR | Status: DC | PRN
  Administered 2015-03-05: 13 mL

## 2015-03-05 MED ORDER — ONDANSETRON HCL 4 MG/2ML IJ SOLN
INTRAMUSCULAR | Status: DC | PRN
Start: 2015-03-05 — End: 2015-03-05
  Administered 2015-03-05: 4 mg via INTRAVENOUS

## 2015-03-05 MED ORDER — OXYCODONE HCL 5 MG/5ML PO SOLN: 5.0000 mg | Freq: Once | ORAL | Status: DC | PRN

## 2015-03-05 MED ORDER — ONDANSETRON HCL 4 MG/2ML IJ SOLN
INTRAMUSCULAR | Status: AC
  Filled 2015-03-05: qty 2

## 2015-03-05 MED ORDER — OXYCODONE HCL 5 MG PO TABS: 5.0000 mg | ORAL_TABLET | Freq: Once | ORAL | Status: DC | PRN

## 2015-03-05 MED ORDER — EPHEDRINE SULFATE 50 MG/ML IJ SOLN
INTRAMUSCULAR | Status: DC | PRN
  Administered 2015-03-05 (×2): 5 mg via INTRAVENOUS

## 2015-03-05 MED ORDER — HYDROMORPHONE HCL 2 MG PO TABS
4.0000 mg | ORAL_TABLET | ORAL | Status: DC | PRN
  Administered 2015-03-05 – 2015-03-10 (×8): 4 mg via ORAL
  Filled 2015-03-05 (×8): qty 2

## 2015-03-05 MED ORDER — FENTANYL CITRATE (PF) 100 MCG/2ML IJ SOLN
INTRAMUSCULAR | Status: DC | PRN
  Administered 2015-03-05 (×2): 25 ug via INTRAVENOUS
  Administered 2015-03-05 (×3): 50 ug via INTRAVENOUS

## 2015-03-05 MED ORDER — PANTOPRAZOLE SODIUM 40 MG IV SOLR
40.0000 mg | Freq: Every day | INTRAVENOUS | Status: DC
Start: 2015-03-05 — End: 2015-03-06
  Administered 2015-03-05: 40 mg via INTRAVENOUS
  Filled 2015-03-05 (×2): qty 40

## 2015-03-05 MED ORDER — SODIUM CHLORIDE 0.9 % IJ SOLN
INTRAMUSCULAR | Status: AC
  Filled 2015-03-05: qty 20

## 2015-03-05 MED ORDER — NEOSTIGMINE METHYLSULFATE 10 MG/10ML IV SOLN
INTRAVENOUS | Status: DC | PRN
  Administered 2015-03-05: 3.5 mg via INTRAVENOUS

## 2015-03-05 MED ORDER — BUPIVACAINE-EPINEPHRINE 0.25% -1:200000 IJ SOLN
INTRAMUSCULAR | Status: AC
Start: 2015-03-05 — End: 2015-03-05
  Filled 2015-03-05: qty 1

## 2015-03-05 MED ORDER — HYDROMORPHONE HCL 1 MG/ML IJ SOLN
0.2500 mg | INTRAMUSCULAR | Status: DC | PRN
  Administered 2015-03-05 (×2): 0.5 mg via INTRAVENOUS

## 2015-03-05 MED ORDER — ROCURONIUM BROMIDE 100 MG/10ML IV SOLN
INTRAVENOUS | Status: DC | PRN
  Administered 2015-03-05: 40 mg via INTRAVENOUS
  Administered 2015-03-05: 5 mg via INTRAVENOUS

## 2015-03-05 MED ORDER — FLUTICASONE PROPIONATE 50 MCG/ACT NA SUSP
1.0000 | Freq: Every day | NASAL | Status: DC
  Filled 2015-03-05: qty 16

## 2015-03-05 MED ORDER — PROMETHAZINE HCL 25 MG/ML IJ SOLN: 6.2500 mg | INTRAMUSCULAR | Status: DC | PRN

## 2015-03-05 MED ORDER — CEFAZOLIN SODIUM-DEXTROSE 2-3 GM-% IV SOLR
INTRAVENOUS | Status: AC
  Filled 2015-03-05: qty 50

## 2015-03-05 MED ORDER — LACTATED RINGERS IV SOLN: INTRAVENOUS | Status: DC

## 2015-03-05 MED ORDER — SORBITOL 70 % SOLN
30.0000 mL | Freq: Two times a day (BID) | Status: DC
  Administered 2015-03-05 – 2015-03-07 (×4): 30 mL via ORAL
  Filled 2015-03-05 (×8): qty 30

## 2015-03-05 MED ORDER — ROCURONIUM BROMIDE 100 MG/10ML IV SOLN
INTRAVENOUS | Status: AC
  Filled 2015-03-05: qty 1

## 2015-03-05 MED ORDER — EPHEDRINE SULFATE 50 MG/ML IJ SOLN
INTRAMUSCULAR | Status: AC
  Filled 2015-03-05: qty 1

## 2015-03-05 MED ORDER — MIDAZOLAM HCL 5 MG/5ML IJ SOLN
INTRAMUSCULAR | Status: DC | PRN
  Administered 2015-03-05: 1 mg via INTRAVENOUS

## 2015-03-05 MED ORDER — HYDROMORPHONE HCL 1 MG/ML IJ SOLN
1.0000 mg | INTRAMUSCULAR | Status: DC | PRN
  Administered 2015-03-05 – 2015-03-07 (×2): 1 mg via INTRAVENOUS
  Filled 2015-03-05 (×2): qty 1

## 2015-03-05 MED ORDER — PROPOFOL 10 MG/ML IV BOLUS
INTRAVENOUS | Status: DC | PRN
  Administered 2015-03-05: 10 mg via INTRAVENOUS

## 2015-03-05 MED ORDER — HYDROMORPHONE HCL 1 MG/ML IJ SOLN
INTRAMUSCULAR | Status: AC
  Filled 2015-03-05: qty 1

## 2015-03-05 MED ORDER — ONDANSETRON HCL 4 MG/2ML IJ SOLN
4.0000 mg | Freq: Four times a day (QID) | INTRAMUSCULAR | Status: DC | PRN
  Administered 2015-03-06 – 2015-03-08 (×4): 4 mg via INTRAVENOUS
  Filled 2015-03-05 (×3): qty 2

## 2015-03-05 MED ORDER — GLYCOPYRROLATE 0.2 MG/ML IJ SOLN
INTRAMUSCULAR | Status: DC | PRN
  Administered 2015-03-05: .5 mg via INTRAVENOUS

## 2015-03-05 SURGICAL SUPPLY — 49 items
ANCHOR TIS RET SYS 200 (MISCELLANEOUS) ×3 IMPLANT
APPLICATOR DUAL LIQUID (MISCELLANEOUS) ×3 IMPLANT
APPLIER CLIP 5 13 M/L LIGAMAX5 (MISCELLANEOUS) ×3
APPLIER CLIP ROT 10 11.4 M/L (STAPLE) ×3
CATH COUDE 22FR 5CC (CATHETERS) ×1 IMPLANT
CATH RIBBED COUDE 30CC (CATHETERS) ×2
CHLORAPREP W/TINT 26ML (MISCELLANEOUS) ×3 IMPLANT
CLIP APPLIE 5 13 M/L LIGAMAX5 (MISCELLANEOUS) ×1 IMPLANT
CLIP APPLIE ROT 10 11.4 M/L (STAPLE) ×1 IMPLANT
COVER SURGICAL LIGHT HANDLE (MISCELLANEOUS) ×3 IMPLANT
DECANTER SPIKE VIAL GLASS SM (MISCELLANEOUS) ×3 IMPLANT
DISSECTOR BLUNT TIP ENDO 5MM (MISCELLANEOUS) IMPLANT
DRAPE LAPAROSCOPIC ABDOMINAL (DRAPES) ×3 IMPLANT
DRAPE UTILITY XL STRL (DRAPES) ×3 IMPLANT
ELECT PENCIL ROCKER SW 15FT (MISCELLANEOUS) ×3 IMPLANT
GLOVE BIOGEL PI IND STRL 7.5 (GLOVE) ×1 IMPLANT
GLOVE BIOGEL PI INDICATOR 7.5 (GLOVE) ×2
GLOVE ECLIPSE 7.5 STRL STRAW (GLOVE) ×15 IMPLANT
GOWN STRL REUS W/TWL XL LVL3 (GOWN DISPOSABLE) ×9 IMPLANT
KIT BASIN OR (CUSTOM PROCEDURE TRAY) ×3 IMPLANT
NS IRRIG 1000ML POUR BTL (IV SOLUTION) ×3 IMPLANT
POUCH ENDO CATCH II 15MM (MISCELLANEOUS) IMPLANT
RELOAD STAPLER BLUE 60MM (STAPLE) IMPLANT
RELOAD STAPLER WHITE 60MM (STAPLE) ×2 IMPLANT
SCISSORS LAP 5X35 DISP (ENDOMECHANICALS) ×3 IMPLANT
SET IRRIG TUBING LAPAROSCOPIC (IRRIGATION / IRRIGATOR) ×3 IMPLANT
SHEARS HARMONIC ACE PLUS 36CM (ENDOMECHANICALS) ×3 IMPLANT
SLEEVE XCEL OPT CAN 5 100 (ENDOMECHANICALS) ×3 IMPLANT
SOLUTION ANTI FOG 6CC (MISCELLANEOUS) ×3 IMPLANT
SPONGE LAP 18X18 X RAY DECT (DISPOSABLE) ×3 IMPLANT
STAPLE ECHEON FLEX 60 POW ENDO (STAPLE) ×3 IMPLANT
STAPLER RELOAD BLUE 60MM (STAPLE)
STAPLER RELOAD WHITE 60MM (STAPLE) ×6
STAPLER VISISTAT 35W (STAPLE) ×3 IMPLANT
SUT MNCRL AB 4-0 PS2 18 (SUTURE) ×3 IMPLANT
SUT NOVA NAB DX-16 0-1 5-0 T12 (SUTURE) IMPLANT
SUT PDS AB 0 CT1 36 (SUTURE) ×6 IMPLANT
TOWEL OR 17X26 10 PK STRL BLUE (TOWEL DISPOSABLE) ×3 IMPLANT
TOWEL OR NON WOVEN STRL DISP B (DISPOSABLE) ×3 IMPLANT
TRAY FOLEY W/METER SILVER 14FR (SET/KITS/TRAYS/PACK) IMPLANT
TRAY FOLEY W/METER SILVER 16FR (SET/KITS/TRAYS/PACK) ×3 IMPLANT
TRAY LAPAROSCOPIC (CUSTOM PROCEDURE TRAY) ×3 IMPLANT
TROCAR BLADELESS OPT 5 100 (ENDOMECHANICALS) ×3 IMPLANT
TROCAR XCEL 12X100 BLDLESS (ENDOMECHANICALS) ×3 IMPLANT
TROCAR XCEL BLUNT TIP 100MML (ENDOMECHANICALS) IMPLANT
TROCAR XCEL NON-BLD 11X100MML (ENDOMECHANICALS) ×3 IMPLANT
TUBING FILTER THERMOFLATOR (ELECTROSURGICAL) ×3 IMPLANT
WATER STERILE IRR 1500ML POUR (IV SOLUTION) ×3 IMPLANT
YANKAUER SUCT BULB TIP 10FT TU (MISCELLANEOUS) IMPLANT

## 2015-03-05 NOTE — Progress Notes (Signed)
Pt has rash on body, upper and lower extremities, states Dr Benay Spice called Dr Excell Seltzer yesterday and discussed with him

## 2015-03-05 NOTE — Assessment & Plan Note (Signed)
Patient reports progressive, red, pruritic rash to his generalized body within the past several days. He denies any other allergic-type symptoms whatsoever.   Patient's only new medication recently has been Dilaudid; but Dilaudid was prescribed/started after rash developed.   On exam.-Patient has a pink/red non--raised rash to his entire trunk region ; as well as his bilateral groin and upper legs. Patient is occasionally scratching at the rash.    Unable to determine at this time the exact cause of the rash development.  Rash could be a delayed reaction to antibiotics from a few weeks ago; or related to the OxyContin patient is taking.    Patient was advised 2 take Benadryl 25 mg and Pepcid 20 mg for rash symptoms to see if that would help.  Also, patient is scheduled for a diagnostic splenectomy first thing in the morning.  Dr. Benay Spice spoke with Dr. Excell Seltzer general surgeon to review rash findings.  Dr. Excell Seltzer advised the patient should proceed with planned surgery in the morning.   Patient and his wife were both advised to go directly to the emergency department overnight.  If he develops any worsening symptoms whatsoever.

## 2015-03-05 NOTE — Interval H&P Note (Signed)
History and Physical Interval Note:  03/05/2015 8:20 AM  Perry Nielsen.  has presented today for surgery, with the diagnosis of splenic mass  The various methods of treatment have been discussed with the patient and family. After consideration of risks, benefits and other options for treatment, the patient has consented to  Procedure(s): LAPAROSCOPIC SPLENECTOMY (N/A) as a surgical intervention .  The patient's history has been reviewed, patient examined, no change in status, stable for surgery.  I have reviewed the patient's chart and labs.  Questions were answered to the patient's satisfaction.     Mardie Kellen T

## 2015-03-05 NOTE — Anesthesia Preprocedure Evaluation (Addendum)
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    GI/Hepatic Neg liver ROS, GERD-  ,  Endo/Other  negative endocrine ROS  Renal/GU negative Renal ROS     Musculoskeletal   Abdominal   Peds  Hematology Thrombocytopenia- 76,000   Anesthesia Other Findings   Reproductive/Obstetrics                             Lab Results  Component Value Date   WBC 6.0 03/04/2015   HGB 14.0 03/04/2015   HCT 43.1 03/04/2015   MCV 93.1 03/04/2015   PLT 76* 03/04/2015   Lab Results  Component Value Date   CREATININE 0.85 03/04/2015   BUN 19 03/04/2015   NA 137 03/04/2015   K 4.4 03/04/2015   CL 100* 03/04/2015   CO2 30 03/04/2015    Anesthesia Physical Anesthesia Plan  ASA: III  Anesthesia Plan: General   Post-op Pain Management:    Induction: Intravenous  Airway Management Planned: Oral ETT  Additional Equipment:   Intra-op Plan:   Post-operative Plan: Extubation in OR  Informed Consent: I have reviewed the patients History and Physical, chart, labs and discussed the procedure including the risks, benefits and alternatives for the proposed anesthesia with the patient or authorized representative who has indicated his/her understanding and acceptance.     Plan Discussed with: CRNA  Anesthesia Plan Comments:         Anesthesia Quick Evaluation

## 2015-03-05 NOTE — Anesthesia Postprocedure Evaluation (Signed)
  Anesthesia Post-op Note  Patient: Perry Nielsen.  Procedure(s) Performed: Procedure(s): LAPAROSCOPIC SPLENECTOMY (N/A)  Patient Location: PACU  Anesthesia Type:General  Level of Consciousness: awake and alert   Airway and Oxygen Therapy: Patient Spontanous Breathing  Post-op Pain: none  Post-op Assessment: Post-op Vital signs reviewed              Post-op Vital Signs: Reviewed  Last Vitals:  Filed Vitals:   03/05/15 1206  BP: 115/74  Pulse:   Temp: 36.8 C  Resp:     Complications: No apparent anesthesia complications

## 2015-03-05 NOTE — Anesthesia Procedure Notes (Signed)
Procedure Name: Intubation Date/Time: 03/05/2015 8:39 AM Performed by: Carleene Cooper A Pre-anesthesia Checklist: Patient identified, Timeout performed, Emergency Drugs available, Suction available and Patient being monitored Patient Re-evaluated:Patient Re-evaluated prior to inductionOxygen Delivery Method: Circle system utilized Preoxygenation: Pre-oxygenation with 100% oxygen (DL X 1 with MAc 4 by EMT student. - ETCO2. DL  X1 by CRNA. Grade 1 view. ATOI) Intubation Type: IV induction Ventilation: Mask ventilation without difficulty Laryngoscope Size: Mac and 4 Grade View: Grade I Tube type: Oral Tube size: 7.5 mm Number of attempts: 2 Airway Equipment and Method: Stylet Placement Confirmation: breath sounds checked- equal and bilateral,  ETT inserted through vocal cords under direct vision and positive ETCO2 Secured at: 23 cm Tube secured with: Tape Dental Injury: Teeth and Oropharynx as per pre-operative assessment  Comments: DL X 1 by EMT student. - ETCO2. DL x 1 by CRNA. Grade 1 view. ATOI.

## 2015-03-05 NOTE — Assessment & Plan Note (Signed)
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  Patient is wearing a right foot splint due to the foot drop and foot discomfort.  Patient is scheduled for a diagnostic laparoscopic splenectomy tomorrow morning 03/05/2015 per Dr. Excell Seltzer general surgeon in hopes of obtaining a biopsy sample to comfirm diagnosis.  Blood counts obtained today revealed WBC 6.0, hemoglobin 14.0, and platelet count 76.  Patient is scheduled to return for follow-up visit here at the [facility] on 03/08/2015.

## 2015-03-05 NOTE — Transfer of Care (Signed)
Immediate Anesthesia Transfer of Care Note  Patient: Perry Nielsen.  Procedure(s) Performed: Procedure(s): LAPAROSCOPIC SPLENECTOMY (N/A)  Patient Location: PACU  Anesthesia Type:General  Level of Consciousness: awake, alert , oriented and patient cooperative  Airway & Oxygen Therapy: Patient Spontanous Breathing and Patient connected to face mask oxygen  Post-op Assessment: Report given to RN, Post -op Vital signs reviewed and stable and Patient moving all extremities  Post vital signs: Reviewed and stable  Last Vitals:  Filed Vitals:   03/05/15 0628  BP: 110/70  Pulse: 77  Temp: 36.8 C  Resp: 18    Complications: No apparent anesthesia complications

## 2015-03-05 NOTE — Op Note (Signed)
Preoperative Diagnosis: splenic mass  Postoprative Diagnosis: splenic mass  Procedure: Procedure(s): LAPAROSCOPIC SPLENECTOMY   Surgeon: Excell Seltzer T   Assistants: Alphonsa Overall  Anesthesia:  General endotracheal anesthesia  Indications: Patient is a 74 year old male generally healthy who recently developed several lytic bone lesions. Biopsy of the bone lesions and bone marrow biopsy have revealed only necrotic tissue suspicious for a hematopoetic malignancy but a diagnosis could not be established.  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 After complete workup by his oncologist in consultation and we have elected to proceed with laparoscopic splenectomy to obtain tissue for diagnosis. The nature of the surgery and its indications and risks have been discussed extensively in detail elsewhere.    Procedure Detail:  Patient was brought to the operating room, placed in the supine position on the operating table, and general endotracheal anesthesia induced. Foley catheter was placed. Orogastric tube was placed. He was carefully positioned both up into a semi-right lateral decubitus position with a beanbag and carefully taped and padded and positioned. The abdomen was widely sterilely prepped and draped. Patient timeout was performed and correct procedure verified. Access was obtained in the left upper quadrant subcostal midclavicular line with an open Hassan technique and pneumoperitoneum established. Under direct vision 5 mm trochars were placed near the midline at the falciform ligament, laterally in the left upper quadrant subcostally. Through these ports the splenic flexure of the colon was mobilized somewhat off the anterior abdominal wall and away from the spleen to allow a fourth 5 mm port be placed laterally in the flank. The spleen appeared slightly enlarged but otherwise unremarkable. Attachments at the lower pole  were mobilized with the Harmonic scalpel and the splenocolic ligament completely taken down protecting the colon. Following this the splenorenal and splenolienel Ligaments were mobilized with the Harmonic scalpel completely rotating the spleen medially on its vascular pedicle and up out of the retroperitoneum and upper pole of the spleen was mobilized away from the diaphragm and the hiatus. Following this the patient was rotated back flat and the lesser sac was entered dividing short gastric vessels with the Harmonic scalpel and all short gastrics were taken down and the fundus of the stomach completely mobilized away from the spleen. Further fibrofatty tissue was dissected along the inferior pole of the spleen working up to the hilum until the spleen was completely isolated on its vascular pedicle. The lateral 5 mm trocar was replaced with a 12 mm trocar to allow a better angle for the stapler. The spleen was elevated and the isolated hilum was divided with 2 firings of the white load 60 mm echelon stapler. This was right in the splenic hilum and I believe away from the tail of the pancreas although we did not definitely identify pancreatic tissue. There was no bleeding of significance from the staple line. A specimen bag was placed in the abdomen and the spleen placed in the bag which was brought up to the [facility] trocar site. This incision was enlarged a couple centimeters and then the spleen was broken up and removed in 1-5 cm pieces and sent fresh for pathology. This incision was closed in 2 layers with running 0 PDS. Laparoscopy showed no bleeding. The abdomen was irrigated. No evidence of visceral injury. The staple line was coated with Tisseel tissue sealant. The incision and trocar sites were infiltrated with Exparel local anesthetic. Skin incisions were closed with subcuticular 5-0 Monocryl and  Liquiban. Sponge needle and instrument counts were correct.    Findings: As above  Estimated Blood Loss:   less than 100 mL         Drains: none  Blood Given: none          Specimens: Spleen        Complications:  * No complications entered in OR log *         Disposition: PACU - hemodynamically stable.         Condition: stable

## 2015-03-05 NOTE — Progress Notes (Signed)
SYMPTOM MANAGEMENT CLINIC   HPI: Perry Nielsen. 74 y.o. male diagnosed with spinal metastasis and splenic mass; with currently unclear full diagnosis.  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  Patient is wearing a right foot splint due to the foot drop and foot discomfort.  Patient is scheduled for a diagnostic laparoscopic splenectomy tomorrow morning 03/05/2015 per Dr. Excell Seltzer general surgeon in hopes of obtaining a biopsy sample to comfirm diagnosis.   Patient reports progressive, red, pruritic rash to his generalized body within the past several days. He denies any other allergic-type symptoms whatsoever.   Patient's only new medication recently has been Dilaudid; but Dilaudid was prescribed/started after rash developed.   HPI  ROS  Past Medical History  Diagnosis Date  . 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 ALLERGIC RHINITIS 08/03/2007  . GERD 02/18/2007  . KNEE PAIN, LEFT, CHRONIC 08/07/2008  . BACK PAIN 09/18/2009  . HAMMER TOE 08/03/2007  . CAROTID BRUIT, LEFT 08/07/2008  . 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 Foot drop     rt foot  . Rash, skin     torso  . Hepatitis 1977    no residual problems  . Sleeping difficulty   . Skin cancer   . Lymphoma     Past Surgical History  Procedure Laterality Date  . Shoulder open rotator cuff repair  07/06/05    right  . Knee arthroscopy      left  . Tonsillectomy and adenoidectomy    . Transurethral resection of prostate    . Cataract extraction w/ intraocular lens  implant, bilateral      has ERECTILE DYSFUNCTION, MILD; Allergic rhinitis; GERD; BPH associated with nocturia; KNEE PAIN, LEFT, CHRONIC; Backache; DYSFNCT ASSO W/SLEEP  STGES/AROUSAL FRM SLEEP; CAROTID BRUIT, LEFT; Foot drop; Spine metastasis; Malnutrition of moderate degree; and Rash on his problem list.    is allergic to antihistamines, diphenhydramine-type.    Medication List       This list is accurate as of: 03/04/15 11:59 PM.  Always use your most recent med list.               aspirin EC 81 MG tablet  Take 81 mg by mouth every morning.     dexamethasone 4 MG tablet  Commonly known as:  DECADRON  Take 1 tablet (4 mg total) by mouth 2 (two) times daily.     famotidine 20 MG tablet  Commonly known as:  PEPCID  Take 20 mg by mouth 2 (two) times daily.     fish oil-omega-3 fatty acids 1000 MG capsule  Take 2 g by mouth every morning.     fluticasone 50 MCG/ACT nasal spray  Commonly known as:  FLONASE  Place 1 spray into both nostrils at bedtime.     HYDROmorphone 4 MG tablet  Commonly known as:  DILAUDID  Take 1 tablet (4 mg total) by mouth every 4 (four) hours as needed for severe pain.     OxyCODONE 10 mg T12a 12 hr tablet  Commonly known as:  OXYCONTIN  Take 1 tablet (10 mg total) by mouth every 12 (twelve) hours.     oxymetazoline 0.05 % nasal spray  Commonly known as:  AFRIN  Place 1 spray into both nostrils 2 (two) times daily as needed for congestion.     sorbitol 70 % solution  Take 30 mLs by mouth 2 (two) times daily.     tetrahydrozoline 0.05 % ophthalmic solution  Place 1-2 drops into both eyes 4 (four) times daily as needed (for eye irritation).     traZODone 150 MG tablet  Commonly known as:  DESYREL  Take 1 tablet (150 mg total) by mouth at bedtime.         PHYSICAL EXAMINATION  Oncology Vitals 03/05/2015 03/05/2015 03/05/2015 03/05/2015 03/05/2015 03/04/2015 03/04/2015  Height - - - 183 cm - 183 cm 183 cm  Weight - - 70.081 kg - - (No Data) 71.668 kg  Weight (lbs) - - 154 lbs 8 oz - - (No Data) 158 lbs  BMI (kg/m2) - - 20.95 kg/m2 - - - 21.43 kg/m2  Temp - 98 - - 98.2 98.4 97.7  Pulse 71 76 - - 77 72 81    Resp 16 17 - - '18 17 18  ' SpO2 100 100 - - 100 100 99  BSA (m2) - - 1.89 m2 - - - 1.91 m2   BP Readings from Last 3 Encounters:  03/05/15 109/67  03/04/15 120/75  03/04/15 113/90    Physical Exam  Constitutional: He is oriented to person, place, and time and well-developed, well-nourished, and in no distress.  HENT:  Head: Normocephalic and atraumatic.  Mouth/Throat: Oropharynx is clear and moist.  Eyes: Conjunctivae and EOM are normal. Pupils are equal, round, and reactive to light. Right eye exhibits no discharge. Left eye exhibits no discharge. No scleral icterus.  Neck: Normal range of motion.  Pulmonary/Chest: Effort normal. No respiratory distress.  Musculoskeletal: Normal range of motion. He exhibits tenderness. He exhibits no edema.  Chronic right foot drop; and is wearing a splint to his right foot at this present time.  Neurological: He is alert and oriented to person, place, and time.  In wheelchair due to splint to right foot.  Skin: Skin is warm and dry. Rash noted. There is erythema. No pallor.  On exam.-Patient has a pink/red non--raised rash to his entire trunk region ; as well as his bilateral groin and upper legs. Patient is occasionally scratching at the rash.    Psychiatric: Affect normal.  Nursing note and vitals reviewed.   LABORATORY DATA:. Hospital Outpatient Visit on 03/04/2015  Component Date Value Ref Range Status  . WBC 03/04/2015 6.0  4.0 - 10.5 K/uL Final  . RBC 03/04/2015 4.63  4.22 - 5.81 MIL/uL Final  . Hemoglobin 03/04/2015 14.0  13.0 - 17.0 g/dL Final  . HCT 03/04/2015 43.1  39.0 - 52.0 % Final  . MCV 03/04/2015 93.1  78.0 - 100.0 fL Final  . MCH 03/04/2015 30.2  26.0 - 34.0 pg Final  . MCHC 03/04/2015 32.5  30.0 - 36.0 g/dL Final  . RDW 03/04/2015 14.4  11.5 - 15.5 % Final  . Platelets 03/04/2015 76* 150 - 400 K/uL Final   Comment: REPEATED TO VERIFY SPECIMEN CHECKED FOR CLOTS PLATELET COUNT CONFIRMED BY SMEAR LARGE PLATELETS PRESENT    . Sodium 03/04/2015 137  135 - 145 mmol/L Final  . Potassium 03/04/2015 4.4  3.5 - 5.1 mmol/L Final  . Chloride 03/04/2015 100* 101 - 111 mmol/L Final  . CO2 03/04/2015 30  22 - 32 mmol/L Final  . Glucose, Bld 03/04/2015 90  65 - 99 mg/dL Final  . BUN 03/04/2015  19  6 - 20 mg/dL Final  . Creatinine, Ser 03/04/2015 0.85  0.61 - 1.24 mg/dL Final  . Calcium 03/04/2015 8.8* 8.9 - 10.3 mg/dL Final  . GFR calc non Af Amer 03/04/2015 >60  >60 mL/min Final  . GFR calc Af Amer 03/04/2015 >60  >60 mL/min Final   Comment: (NOTE) The eGFR has been calculated using the CKD EPI equation. This calculation has not been validated in all clinical situations. eGFR's persistently <60 mL/min signify possible Chronic Kidney Disease.   . Anion gap 03/04/2015 7  5 - 15 Final  . ABO/RH(D) 03/04/2015 A POS   Final  . Antibody Screen 03/04/2015 NEG   Final  . Sample Expiration 03/04/2015 03/08/2015   Final  . ABO/RH(D) 03/04/2015 A POS   Final   Chest rash:       RADIOGRAPHIC STUDIES: No results found.  ASSESSMENT/PLAN:    Rash  Patient reports progressive, red, pruritic rash to his generalized body within the past several days. He denies any other allergic-type symptoms whatsoever.   Patient's only new medication recently has been Dilaudid; but Dilaudid was prescribed/started after rash developed.   On exam.-Patient has a pink/red non--raised rash to his entire trunk region ; as well as his bilateral groin and upper legs. Patient is occasionally scratching at the rash.    Unable to determine at this time the exact cause of the rash development.  Rash could be a delayed reaction to antibiotics from a few weeks ago; or related to the OxyContin patient is taking.    Patient was advised 2 take Benadryl 25 mg and Pepcid 20 mg for rash symptoms to see if that would help.  Also, patient is scheduled for a diagnostic splenectomy first thing in the morning.  Dr. Benay Spice spoke with Dr. Excell Seltzer general  surgeon to review rash findings.  Dr. Excell Seltzer advised the patient should proceed with planned surgery in the morning.   Patient and his wife were both advised to go directly to the emergency department overnight.  If he develops any worsening symptoms whatsoever.  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  Patient is wearing a right foot splint due to the foot drop and foot discomfort.  Patient is scheduled for a diagnostic laparoscopic splenectomy tomorrow morning 03/05/2015 per Dr. Excell Seltzer general surgeon in hopes of obtaining a biopsy sample to comfirm diagnosis.  Blood counts obtained today revealed WBC 6.0, hemoglobin 14.0, and platelet count 76.  Patient is scheduled to return for follow-up visit here at the [facility] on 03/08/2015.  Patient stated understanding of all instructions; and was in agreement with this plan of care. The patient knows to call the clinic with any problems, questions or concerns.   This was a shared visit with Dr. Benay Spice today.  Total time spent with patient was 25 minutes;  with greater than 75 percent of that time spent in face to face counseling regarding patient's symptoms,  and coordination of care and follow up.  Disclaimer:This dictation was prepared with Dragon/digital dictation along with [employer]. Any transcriptional errors that result from this process are unintentional.  Drue Second, NP 03/05/2015   This was a shared visit with Drue Second. Mr. Fergusson was interviewed and examined. The rash has progress since I saw him  last week. We reviewed his medication list in the etiology of the apparent "drug "rash is unclear. This could be related to OxyContin. He will discontinue OxyContin and  continue Dilaudid as needed for pain.  I discussed the case with Dr. Excell Seltzer. He plans to proceed with the diagnostic splenectomy on 03/05/2015.  Julieanne Manson, M.D.

## 2015-03-06 ENCOUNTER — Encounter (HOSPITAL_COMMUNITY): Payer: Self-pay | Admitting: General Surgery

## 2015-03-06 LAB — CBC
HEMATOCRIT: 36.5 % — AB (ref 39.0–52.0)
HEMOGLOBIN: 12.2 g/dL — AB (ref 13.0–17.0)
MCH: 30.8 pg (ref 26.0–34.0)
MCHC: 33.4 g/dL (ref 30.0–36.0)
MCV: 92.2 fL (ref 78.0–100.0)
Platelets: 109 10*3/uL — ABNORMAL LOW (ref 150–400)
RBC: 3.96 MIL/uL — AB (ref 4.22–5.81)
RDW: 14.4 % (ref 11.5–15.5)
WBC: 8.6 10*3/uL (ref 4.0–10.5)

## 2015-03-06 MED ORDER — PANTOPRAZOLE SODIUM 40 MG PO TBEC
40.0000 mg | DELAYED_RELEASE_TABLET | Freq: Every day | ORAL | Status: DC
  Administered 2015-03-06 – 2015-03-08 (×3): 40 mg via ORAL
  Filled 2015-03-06 (×4): qty 1

## 2015-03-06 NOTE — Telephone Encounter (Signed)
Faxed CAM boot order to [facility] [phone].

## 2015-03-06 NOTE — Progress Notes (Signed)
Patient ID: Perry Iha., male   DOB: [birth year], 74 y.o.   MRN: [id number] 1 Day Post-Op  Subjective: Sore, mild pain, relieved with meds.  Starting diet, no N/V.  Voiding OK  Objective: Vital signs in last 24 hours: Temp:  [98 F (36.7 C)-99.2 F (37.3 C)] 99 F (37.2 C) (08/31 0538) Pulse Rate:  [61-94] 91 (08/31 0538) Resp:  [15-19] 16 (08/31 0538) BP: (103-129)/(60-94) 119/70 mmHg (08/31 0538) SpO2:  [93 %-100 %] 93 % (08/31 0538) Last BM Date: 03/04/15  Intake/Output from previous day: 08/30 0701 - 08/31 0700 In: 4010 [P.O.:120; I.V.:3890] Out: 600 [Urine:525; Blood:75] Intake/Output this shift:    General appearance: alert, cooperative and no distress GI: normal findings: soft, non-tender Skin: erythematous papular rash over trunk, stable or slightly improved Incision/Wound: Clean and dry  Lab Results:   Recent Labs  03/05/15 1140 03/05/15 2000 03/06/15 0535  WBC 6.2  --  8.6  HGB 12.9* 12.0* 12.2*  HCT 38.9*  --  36.5*  PLT 70*  --  109*   BMET  Recent Labs  03/04/15 1415  NA 137  K 4.4  CL 100*  CO2 30  GLUCOSE 90  BUN 19  CREATININE 0.85  CALCIUM 8.8*     Studies/Results: No results found.  Anti-infectives: Anti-infectives    Start     Dose/Rate Route Frequency Ordered Stop   03/05/15 0644  ceFAZolin (ANCEF) IVPB 2 g/50 mL premix     2 g 100 mL/hr over 30 Minutes Intravenous On call to O.R. 03/05/15 6060 03/05/15 0840      Assessment/Plan: s/p Procedure(s): LAPAROSCOPIC SPLENECTOMY Stable post op.  Ambulation encouraged.  Advance diet as tol.    LOS: 1 day    Perry Nielsen T 03/06/2015

## 2015-03-07 ENCOUNTER — Other Ambulatory Visit: Payer: Self-pay | Admitting: Oncology

## 2015-03-07 ENCOUNTER — Other Ambulatory Visit: Payer: Self-pay | Admitting: *Deleted

## 2015-03-07 ENCOUNTER — Telehealth: Payer: Self-pay | Admitting: *Deleted

## 2015-03-07 ENCOUNTER — Inpatient Hospital Stay (HOSPITAL_COMMUNITY): Payer: Medicare Other

## 2015-03-07 DIAGNOSIS — D696 Thrombocytopenia, unspecified: Secondary | ICD-10-CM

## 2015-03-07 DIAGNOSIS — C8510 Unspecified B-cell lymphoma, unspecified site: Secondary | ICD-10-CM

## 2015-03-07 DIAGNOSIS — C8580 Other specified types of non-Hodgkin lymphoma, unspecified site: Secondary | ICD-10-CM

## 2015-03-07 DIAGNOSIS — R509 Fever, unspecified: Secondary | ICD-10-CM

## 2015-03-07 DIAGNOSIS — M4854XA Collapsed vertebra, not elsewhere classified, thoracic region, initial encounter for fracture: Secondary | ICD-10-CM

## 2015-03-07 DIAGNOSIS — L539 Erythematous condition, unspecified: Secondary | ICD-10-CM

## 2015-03-07 LAB — BASIC METABOLIC PANEL
Anion gap: 5 (ref 5–15)
BUN: 8 mg/dL (ref 6–20)
CHLORIDE: 101 mmol/L (ref 101–111)
CO2: 27 mmol/L (ref 22–32)
CREATININE: 0.77 mg/dL (ref 0.61–1.24)
Calcium: 8.3 mg/dL — ABNORMAL LOW (ref 8.9–10.3)
GFR calc non Af Amer: >60 mL/min (ref >60)
Glucose, Bld: 151 mg/dL — ABNORMAL HIGH (ref 65–99)
POTASSIUM: 3.5 mmol/L (ref 3.5–5.1)
Sodium: 133 mmol/L — ABNORMAL LOW (ref 135–145)

## 2015-03-07 LAB — URINALYSIS, ROUTINE W REFLEX MICROSCOPIC
Bilirubin Urine: NEGATIVE
Glucose, UA: NEGATIVE mg/dL
HGB URINE DIPSTICK: NEGATIVE
Ketones, ur: NEGATIVE mg/dL
Leukocytes, UA: NEGATIVE
Nitrite: NEGATIVE
Protein, ur: NEGATIVE mg/dL
SPECIFIC GRAVITY, URINE: 1.019 (ref 1.005–1.030)
UROBILINOGEN UA: 1 mg/dL (ref 0.0–1.0)
pH: 6.5 (ref 5.0–8.0)

## 2015-03-07 LAB — CBC
HEMATOCRIT: 38.7 % — AB (ref 39.0–52.0)
HEMOGLOBIN: 13.1 g/dL (ref 13.0–17.0)
MCH: 31.1 pg (ref 26.0–34.0)
MCHC: 33.9 g/dL (ref 30.0–36.0)
MCV: 91.9 fL (ref 78.0–100.0)
Platelets: 153 10*3/uL (ref 150–400)
RBC: 4.21 MIL/uL — ABNORMAL LOW (ref 4.22–5.81)
RDW: 14.6 % (ref 11.5–15.5)
WBC: 14 10*3/uL — ABNORMAL HIGH (ref 4.0–10.5)

## 2015-03-07 MED ORDER — SODIUM CHLORIDE 0.9 % IV BOLUS (SEPSIS)
500.0000 mL | Freq: Once | INTRAVENOUS | Status: AC
  Administered 2015-03-07: 500 mL via INTRAVENOUS

## 2015-03-07 NOTE — Progress Notes (Signed)
Patient ID: Perry Iha., male   DOB: September 17, 1940, 74 y.o.   MRN: [id number] 2 Days Post-Op  Subjective: States he feels better, less pain.  No other C/O.  Eating some without nausea but not much appetite.  Denies cough, SOB or urinarty Sxs  Objective: Vital signs in last 24 hours: Temp:  [99.7 F (37.6 C)-100.4 F (38 C)] 100.4 F (38 C) (09/01 0507) Pulse Rate:  [89-106] 106 (09/01 0507) Resp:  [16-18] 16 (09/01 0507) BP: (113-137)/(63-76) 118/67 mmHg (09/01 0507) SpO2:  [94 %-96 %] 94 % (09/01 0507) Last BM Date: 03/06/15  Intake/Output from previous day: 08/31 0701 - 09/01 0700 In: 2400 [I.V.:2400] Out: 400 [Urine:400] Intake/Output this shift: Total I/O In: 1200 [I.V.:1200] Out: 0   General appearance: alert, cooperative and no distress Resp: clear to auscultation bilaterally GI: abnormal findings:  mild tenderness in the LUQ and around incisions Skin: papular - generalized rash unchanged Incision/Wound: Clean and dry  Lab Results:   Recent Labs  03/06/15 0535 03/07/15 0510  WBC 8.6 14.0*  HGB 12.2* 13.1  HCT 36.5* 38.7*  PLT 109* 153   BMET  Recent Labs  03/04/15 1415  NA 137  K 4.4  CL 100*  CO2 30  GLUCOSE 90  BUN 19  CREATININE 0.85  CALCIUM 8.8*     Studies/Results: No results found.  Anti-infectives: Anti-infectives    Start     Dose/Rate Route Frequency Ordered Stop   03/05/15 0644  ceFAZolin (ANCEF) IVPB 2 g/50 mL premix     2 g 100 mL/hr over 30 Minutes Intravenous On call to O.R. 03/05/15 [id number] 03/05/15 0840      Assessment/Plan: s/p Procedure(s): LAPAROSCOPIC SPLENECTOMY Has fever and mild tachycardia.  Feels better without specific C/O.  UOP marginal Will give NS bolus, check lab and CXR, U/A.  IS.  Observe in hospital    LOS: 2 days    Lus Kriegel T 03/07/2015

## 2015-03-07 NOTE — Progress Notes (Addendum)
IP PROGRESS NOTE  Subjective:   [name] underwent a diagnostic splenectomy 03/05/2015. He reports soreness in the left abdomen. He had mild GI upset this afternoon. The skin rash has improved and remains nonpruritic..  Objective: Vital signs in last 24 hours: Blood pressure 121/76, pulse 98, temperature 98.6 F (37 C), temperature source Oral, resp. rate 16, height 6' (1.829 m), weight 154 lb 8 oz (70.081 kg), SpO2 97 %.  Intake/Output from previous day: 08/31 0701 - 09/01 0700 In: 2400 [I.V.:2400] Out: 400 [Urine:400]  Physical Exam:  Lungs: Decreased breath sounds with end inspiratory rhonchi at the lower posterior chest bilaterally, no respiratory distress Cardiac: Regular rate and rhythm Abdomen: Soft, healing surgical incisions Extremities: Trace edema at the right lower leg Skin: Diffuse erythematous macular rash appears to be fading, especially over the back Neurologic: 3/5 strength with dorsi flexion at the right foot   Lab Results:  Recent Labs  03/06/15 0535 03/07/15 0510  WBC 8.6 14.0*  HGB 12.2* 13.1  HCT 36.5* 38.7*  PLT 109* 153    BMET  Recent Labs  03/07/15 0510  NA 133*  K 3.5  CL 101  CO2 27  GLUCOSE 151*  BUN 8  CREATININE 0.77  CALCIUM 8.3*    Studies/Results: Dg Chest 2 View  03/07/2015   CLINICAL DATA:  Postoperative fever.  EXAM: CHEST  2 VIEW  COMPARISON:  02/03/2015 chest radiograph  FINDINGS: Low lung volumes. Stable cardiomediastinal silhouette with top-normal heart size and mildly tortuous thoracic aorta. No pneumothorax. No pleural effusion. Mild curvilinear opacities at both lung bases, most in keeping with atelectasis. No focal lung consolidation to suggest a pneumonia. Mild degenerative changes in the thoracic spine.  IMPRESSION: Low lung volumes with bibasilar atelectasis. No focal lung consolidation to suggest a pneumonia.   Electronically Signed   By: Ilona Sorrel M.D.   On: 03/07/2015 10:07    Medications: I have  reviewed the patient's current medications.  Assessment/Plan:  1. 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 Thrombocytopenia 3. T7 compression fracture-likely pathologic 4. 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 Right foot pain-likely secondary to nerve compression from the sacral mass 6. Diffuse erythematous rash consistent with a "drug "rash-slowly fading 7. Low-grade fever likely secondary to atelectasis/postop fever versus tumor fever   Perry Nielsen is recovering from the splenectomy procedure. He has been diagnosed with diffuse large B-cell non-Hodgkin's lymphoma. He has advanced stage disease. I discussed the diagnosis, prognosis, and treatment options with Perry Nielsen. I recommend treatment with CHOP-rituximab. We discussed the potential toxicities associated with the CHOP regimen including the chance for nausea/vomiting, mucositis, diarrhea, alopecia, and hematologic toxicity. We discussed the cardiac toxicity associated with doxorubicin. We reviewed the vesicle property of doxorubicin and vincristine. We discussed the hemorrhagic cystitis seen with cyclophosphamide. We reviewed the allergic reaction, hematologic toxicity, reactivation of hepatitis, and leucoencephalitis seen with rituximab. He agrees to proceed. He will attend a chemotherapy teaching class.  He will receive Neulasta support. We reviewed the potential for bone pain and  rash with Neulasta.  I will  return in the evening on 03/07/2015 to discuss the diagnosis and treatment plan with his wife.  Recommendations: 1. Echocardiogram and hepatitis serologies prior to chemotherapy 2. Chemotherapy teaching class will be scheduled for the week of 03/11/2015 3. Cycle one CHOP-rituximab 03/15/2015 4. Outpatient follow-up will be scheduled at the [facility] for 03/25/2015 5. Cycle one CHOP-rituximab can be delivered with peripheral IV access, we will ask Dr. Excell Seltzer to place a Port-A-Cath if it appears this will be needed for subsequent chemotherapy cycles. 6. Continue outpatient physical therapy for the right foot drop    LOS: 2 days   Briona Korpela  03/07/2015, 4:49 PM

## 2015-03-07 NOTE — Telephone Encounter (Signed)
Per staff message and POF I have scheduled appts. Advised scheduler of appts. JMW  

## 2015-03-07 NOTE — Care Management Important Message (Signed)
Important Message  Patient Details  Name: Perry Nielsen. MRN: [id number] Date of Birth: 06/05/1941   Medicare Important Message Given:  Yes-second notification given    Camillo Flaming 03/07/2015, 2:43 [place] Message  Patient Details  Name: Perry Nielsen. MRN: [id number] Date of Birth: 04-08-1941   Medicare Important Message Given:  Yes-second notification given    Camillo Flaming 03/07/2015, 2:43 PM

## 2015-03-08 ENCOUNTER — Inpatient Hospital Stay (HOSPITAL_COMMUNITY): Payer: Medicare Other

## 2015-03-08 ENCOUNTER — Ambulatory Visit: Payer: Medicare Other | Admitting: Oncology

## 2015-03-08 DIAGNOSIS — Z01810 Encounter for preprocedural cardiovascular examination: Secondary | ICD-10-CM

## 2015-03-08 DIAGNOSIS — B1910 Unspecified viral hepatitis B without hepatic coma: Secondary | ICD-10-CM

## 2015-03-08 DIAGNOSIS — R110 Nausea: Secondary | ICD-10-CM

## 2015-03-08 LAB — HEPATITIS B CORE ANTIBODY, TOTAL: HEP B C TOTAL AB: POSITIVE — AB

## 2015-03-08 LAB — C DIFFICILE QUICK SCREEN W PCR REFLEX
C DIFFICILE (CDIFF) TOXIN: NEGATIVE
C DIFFICLE (CDIFF) ANTIGEN: NEGATIVE
C Diff interpretation: NEGATIVE

## 2015-03-08 LAB — CBC
HCT: 40.1 % (ref 39.0–52.0)
Hemoglobin: 13.1 g/dL (ref 13.0–17.0)
MCH: 30.3 pg (ref 26.0–34.0)
MCHC: 32.7 g/dL (ref 30.0–36.0)
MCV: 92.8 fL (ref 78.0–100.0)
PLATELETS: 185 10*3/uL (ref 150–400)
RBC: 4.32 MIL/uL (ref 4.22–5.81)
RDW: 14.5 % (ref 11.5–15.5)
WBC: 11.5 10*3/uL — AB (ref 4.0–10.5)

## 2015-03-08 LAB — BASIC METABOLIC PANEL
Anion gap: 8 (ref 5–15)
BUN: 9 mg/dL (ref 6–20)
CHLORIDE: 100 mmol/L — AB (ref 101–111)
CO2: 28 mmol/L (ref 22–32)
CREATININE: 0.8 mg/dL (ref 0.61–1.24)
Calcium: 8.2 mg/dL — ABNORMAL LOW (ref 8.9–10.3)
Glucose, Bld: 124 mg/dL — ABNORMAL HIGH (ref 65–99)
POTASSIUM: 4 mmol/L (ref 3.5–5.1)
SODIUM: 136 mmol/L (ref 135–145)

## 2015-03-08 LAB — HEPATITIS B SURFACE ANTIGEN: Hepatitis B Surface Ag: NEGATIVE

## 2015-03-08 LAB — LIPASE, BLOOD: Lipase: 11 U/L — ABNORMAL LOW (ref 22–51)

## 2015-03-08 NOTE — Progress Notes (Signed)
Patient ID: Perry Nielsen., male   DOB: 1941/06/23, 74 y.o.   MRN: [id number] 3 Days Post-Op  Subjective: Doesn't feel well. Mainly due to onset of frequent diarrhea. Also having some nausea without vomiting. On questioning has some left upper quadrant pain but not severe and does not feel it is any worse than yesterday.  Objective: Vital signs in last 24 hours: Temp:  [98.3 F (36.8 C)-98.6 F (37 C)] 98.6 F (37 C) (09/02 0506) Pulse Rate:  [85-98] 96 (09/02 0803) Resp:  [16] 16 (09/02 0506) BP: (107-122)/(68-76) 122/73 mmHg (09/02 0506) SpO2:  [95 %-97 %] 95 % (09/02 0506) Last BM Date: 03/08/15  Intake/Output from previous day: 09/01 0701 - 09/02 0700 In: 3247.2 [P.O.:847.2; I.V.:2400] Out: 750 [Urine:700; Stool:50] Intake/Output this shift:    General appearance: alert, cooperative, fatigued and no distress Resp: clear to auscultation bilaterally GI: mild left upper quadrant tenderness without guarding but seems no different from previous exams. Mild distention. Skin: Erythematous diffuse papular rash mainly over trunk seems more prominent today Incision/Wound: No erythema or drainage or swelling  Lab Results:   Recent Labs  03/07/15 0510 03/08/15 0547  WBC 14.0* 11.5*  HGB 13.1 13.1  HCT 38.7* 40.1  PLT 153 185   BMET  Recent Labs  03/07/15 0510 03/08/15 0547  NA 133* 136  K 3.5 4.0  CL 101 100*  CO2 27 28  GLUCOSE 151* 124*  BUN 8 9  CREATININE 0.77 0.80  CALCIUM 8.3* 8.2*   Pathology has confirmed large B-cell lymphoma  Studies/Results: Dg Chest 2 View  03/07/2015   CLINICAL DATA:  Postoperative fever.  EXAM: CHEST  2 VIEW  COMPARISON:  02/03/2015 chest radiograph  FINDINGS: Low lung volumes. Stable cardiomediastinal silhouette with top-normal heart size and mildly tortuous thoracic aorta. No pneumothorax. No pleural effusion. Mild curvilinear opacities at both lung bases, most in keeping with atelectasis. No focal lung consolidation to suggest  a pneumonia. Mild degenerative changes in the thoracic spine.  IMPRESSION: Low lung volumes with bibasilar atelectasis. No focal lung consolidation to suggest a pneumonia.   Electronically Signed   By: Perry Nielsen M.D.   On: 03/07/2015 10:07    Anti-infectives: Anti-infectives    Start     Dose/Rate Route Frequency Ordered Stop   03/05/15 0644  ceFAZolin (ANCEF) IVPB 2 g/50 mL premix     2 g 100 mL/hr over 30 Minutes Intravenous On call to O.R. 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 Some nausea. Abdominal exam seems relatively benign. Rule out C. Difficile, studies ordered Could possibly be some tumor effects per Dr. Benay Nielsen Pathology also showed some pancreatic tissue, not unexpected. I doubt intra-abdominal complication such as pancreatic leak although this is possible. As his exam seems stable, vital signs stable and white count is decreased today. We'll check for C. Difficile and observe closely. If no improvement or any worsening I will get a CT scan of the abdomen and pelvis.   LOS: 3 days    Perry Nielsen T 03/08/2015

## 2015-03-08 NOTE — Progress Notes (Signed)
IP PROGRESS NOTE  Subjective:   Perry Nielsen complains of nausea, anorexia, and diarrhea. He had diarrhea last night and again this morning. The abdomen is sore. The skin rash is not pruritic.  Objective: Vital signs in last 24 hours: Blood pressure 122/73, pulse 96, temperature 98.6 F (37 C), temperature source Oral, resp. rate 16, height 6' (1.829 m), weight 154 lb 8 oz (70.081 kg), SpO2 95 %.  Intake/Output from previous day: 09/01 0701 - 09/02 0700 In: 3247.2 [P.O.:847.2; I.V.:2400] Out: 750 [Urine:700; Stool:50]  Physical Exam:  HEENT: Mild whitecoat over the tongue, no buccal thrush Lungs: Clear bilaterally, no respiratory distress Cardiac: Irregular, tachycardia Abdomen: Soft, healing surgical incisions Extremities: Trace pitting edema at the right lower leg Skin: Diffuse erythematous maculopapular rash over the trunk and extremities with several areas of sparing, no nodularity    Lab Results:  Recent Labs  03/07/15 0510 03/08/15 0547  WBC 14.0* 11.5*  HGB 13.1 13.1  HCT 38.7* 40.1  PLT 153 185    BMET  Recent Labs  03/07/15 0510 03/08/15 0547  NA 133* 136  K 3.5 4.0  CL 101 100*  CO2 27 28  GLUCOSE 151* 124*  BUN 8 9  CREATININE 0.77 0.80  CALCIUM 8.3* 8.2*    Studies/Results: Dg Chest 2 View  03/07/2015   CLINICAL DATA:  Postoperative fever.  EXAM: CHEST  2 VIEW  COMPARISON:  02/03/2015 chest radiograph  FINDINGS: Low lung volumes. Stable cardiomediastinal silhouette with top-normal heart size and mildly tortuous thoracic aorta. No pneumothorax. No pleural effusion. Mild curvilinear opacities at both lung bases, most in keeping with atelectasis. No focal lung consolidation to suggest a pneumonia. Mild degenerative changes in the thoracic spine.  IMPRESSION: Low lung volumes with bibasilar atelectasis. No focal lung consolidation to suggest a pneumonia.   Electronically Signed   By: Ilona Sorrel M.D.   On: 03/07/2015 10:07    Medications: I have  reviewed the patient's current medications.  Assessment/Plan:  1. 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 Thrombocytopenia 3. T7 compression fracture-likely pathologic 4. 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 Right foot pain-likely secondary to nerve compression from the sacral mass 6. Diffuse erythematous rash, appearance consistent with a "drug "rash-persistent 7. Low-grade fever likely secondary to atelectasis/postop fever versus tumor fever 8. Nausea, diarrhea-potentially related to sorbitol or C. Difficile. 9. Hepatitis B core antibody positive    Perry Nielsen continues to have nausea and diarrhea. The fever has improved. The rash is brighter today. I discussed the case with Dr. Excell Seltzer. He does not appear stable for discharge.  The diarrhea may be related to using sorbitol, but he is at significant risk for C. Difficile.  The rash could be a drug rash, but has been present for greater than one week and is not resolving. This could be a paraneoplastic rash or direct skin involvement with lymphoma.  He will need to stay in the hospital for at least the next few days. If his status does not improve we can initiate cycle one CHOP-rituximab in the hospital.  Recommendations:  1. Continue postoperative care per Dr. Excell Seltzer 2. Discontinue sorbitol 3. Check stool C. difficile toxin 4. EKG 5. Oncology will continue following him daily  and consider initiating cycle 1 CHOP-rituximab as an inpatient if his clinical status does not improve over the next few days.  I will be out until 03/13/2015. Dr. Marin Olp will follow Perry Nielsen.  LOS: 3 days   Perry Nielsen  03/08/2015, 10:06 AM

## 2015-03-08 NOTE — Progress Notes (Signed)
  Echocardiogram 2D Echocardiogram has been performed.  Aggie Cosier 03/08/2015, 10:36 AM

## 2015-03-09 DIAGNOSIS — R197 Diarrhea, unspecified: Secondary | ICD-10-CM

## 2015-03-09 DIAGNOSIS — M21371 Foot drop, right foot: Secondary | ICD-10-CM

## 2015-03-09 DIAGNOSIS — R21 Rash and other nonspecific skin eruption: Secondary | ICD-10-CM

## 2015-03-09 DIAGNOSIS — C8337 Diffuse large B-cell lymphoma, spleen: Principal | ICD-10-CM

## 2015-03-09 DIAGNOSIS — E876 Hypokalemia: Secondary | ICD-10-CM

## 2015-03-09 LAB — CBC
HEMATOCRIT: 37.3 % — AB (ref 39.0–52.0)
Hemoglobin: 12.2 g/dL — ABNORMAL LOW (ref 13.0–17.0)
MCH: 29.9 pg (ref 26.0–34.0)
MCHC: 32.7 g/dL (ref 30.0–36.0)
MCV: 91.4 fL (ref 78.0–100.0)
PLATELETS: 230 10*3/uL (ref 150–400)
RBC: 4.08 MIL/uL — ABNORMAL LOW (ref 4.22–5.81)
RDW: 14.2 % (ref 11.5–15.5)
WBC: 8.5 10*3/uL (ref 4.0–10.5)

## 2015-03-09 LAB — LIPASE, BLOOD: Lipase: 20 U/L — ABNORMAL LOW (ref 22–51)

## 2015-03-09 MED ORDER — FAMOTIDINE 40 MG PO TABS
40.0000 mg | ORAL_TABLET | Freq: Two times a day (BID) | ORAL | Status: DC
  Administered 2015-03-09 – 2015-03-10 (×3): 40 mg via ORAL
  Filled 2015-03-09 (×4): qty 1

## 2015-03-09 NOTE — Progress Notes (Signed)
Patient ID: Perry Iha., male   DOB: April 08, 1941, 74 y.o.   MRN: [id number] 4 Days Post-Op  Subjective: Feels much better today. Diarrhea has stopped. Denies pain or nausea.  Objective: Vital signs in last 24 hours: Temp:  [98.3 F (36.8 C)-98.8 F (37.1 C)] 98.8 F (37.1 C) (09/03 0510) Pulse Rate:  [78-113] 78 (09/03 0510) Resp:  [18] 18 (09/03 0510) BP: (97-103)/(61-69) 103/61 mmHg (09/03 0510) SpO2:  [95 %-97 %] 97 % (09/03 0510) Last BM Date: 03/08/15  Intake/Output from previous day: 09/02 0701 - 09/03 0700 In: 2460 [P.O.:60; I.V.:2400] Out: 350 [Urine:300; Stool:50] Intake/Output this shift:    General appearance: alert, cooperative and no distress GI: normal findings: soft, non-tender Skin: erythematous rash mainly over trunk unchanged Incision/Wound: clean and dry without evidence of infection  Lab Results:   Recent Labs  03/08/15 0547 03/09/15 0505  WBC 11.5* 8.5  HGB 13.1 12.2*  HCT 40.1 37.3*  PLT 185 230   BMET  Recent Labs  03/07/15 0510 03/08/15 0547  NA 133* 136  K 3.5 4.0  CL 101 100*  CO2 27 28  GLUCOSE 151* 124*  BUN 8 9  CREATININE 0.77 0.80  CALCIUM 8.3* 8.2*     Studies/Results: Dg Chest 2 View  03/07/2015   CLINICAL DATA:  Postoperative fever.  EXAM: CHEST  2 VIEW  COMPARISON:  02/03/2015 chest radiograph  FINDINGS: Low lung volumes. Stable cardiomediastinal silhouette with top-normal heart size and mildly tortuous thoracic aorta. No pneumothorax. No pleural effusion. Mild curvilinear opacities at both lung bases, most in keeping with atelectasis. No focal lung consolidation to suggest a pneumonia. Mild degenerative changes in the thoracic spine.  IMPRESSION: Low lung volumes with bibasilar atelectasis. No focal lung consolidation to suggest a pneumonia.   Electronically Signed   By: Ilona Sorrel M.D.   On: 03/07/2015 10:07    Anti-infectives: Anti-infectives    Start     Dose/Rate Route Frequency Ordered Stop   03/05/15  0644  ceFAZolin (ANCEF) IVPB 2 g/50 mL premix     2 g 100 mL/hr over 30 Minutes Intravenous On call to O.R. 03/05/15 0626 03/05/15 0840      Assessment/Plan: s/p Procedure(s): LAPAROSCOPIC SPLENECTOMY B-cell lymphoma Much improved today. Would like to go home. We will monitor this morning and possibly home later today or tomorrow.   LOS: 4 days    Damon Hargrove T 03/09/2015

## 2015-03-09 NOTE — Progress Notes (Signed)
Mr. Huston is feeling a little bit better. It sounds like the diarrhea is slowly improving. His C. difficile is negative. Possibly the diarrhea could've been from laxative that he having given prior.  The rash is still present. It is quite erythematous. There is no skin breakdown. There is no pruritus.  He had an echocardiogram done yesterday. The ejection fraction is 60%.  His appetite is doing a little bit better.  He is out of bed.  He is recovering from the splenectomy nicely.  He's had no fever.  On his physical exam, his vital signs were all stable. Temperature 98.8. Pulse 78. Blood pressure 103/61. Head and neck exam shows no adenopathy. There is no oral lesions. Lungs are clear. Cardiac exam regular rate and rhythm with no murmurs, rubs or bruits. Abdomen is soft. He has the laparoscopy scars. There are healing. There is no distention. He has no guarding or rebound tenderness. Extremities shows no clubbing, cyanosis or edema. Skin exam shows the macular rash on his trunk mostly. Is quite erythematous. It blanches slightly. No vesicles are noted. Neurological exam shows no focal deficits.  Everything looks fairly stable to me. I will like to think that this rash is a drug reaction. I'm just not sure what he may receive. It would be a little unusual to see a rash at this with lymphoma. However, this cannot be totally ruled out.  He is relatively stable. I think the diarrhea is well will keep in the hospital for right now.  I would watch him today. If the diarrhea continues to improve, then hopefully, he go home tomorrow and be treated as an outpatient for the lymphoma. He is okay with this. He just wants to go home and watch football.  He obviously is getting fantastic care from everybody up on [address].  Pete E.  Psalm 51:10

## 2015-03-10 LAB — COMPREHENSIVE METABOLIC PANEL
ALBUMIN: 2.4 g/dL — AB (ref 3.5–5.0)
ALK PHOS: 41 U/L (ref 38–126)
ALT: 19 U/L (ref 17–63)
AST: 15 U/L (ref 15–41)
Anion gap: 7 (ref 5–15)
BUN: 9 mg/dL (ref 6–20)
CALCIUM: 8 mg/dL — AB (ref 8.9–10.3)
CO2: 27 mmol/L (ref 22–32)
CREATININE: 0.78 mg/dL (ref 0.61–1.24)
Chloride: 100 mmol/L — ABNORMAL LOW (ref 101–111)
GFR calc Af Amer: >60 mL/min (ref >60)
GFR calc non Af Amer: >60 mL/min (ref >60)
GLUCOSE: 85 mg/dL (ref 65–99)
Potassium: 3.1 mmol/L — ABNORMAL LOW (ref 3.5–5.1)
SODIUM: 134 mmol/L — AB (ref 135–145)
Total Bilirubin: 0.9 mg/dL (ref 0.3–1.2)
Total Protein: 4.8 g/dL — ABNORMAL LOW (ref 6.5–8.1)

## 2015-03-10 LAB — CBC
HCT: 38.6 % — ABNORMAL LOW (ref 39.0–52.0)
HEMOGLOBIN: 12.8 g/dL — AB (ref 13.0–17.0)
MCH: 30.4 pg (ref 26.0–34.0)
MCHC: 33.2 g/dL (ref 30.0–36.0)
MCV: 91.7 fL (ref 78.0–100.0)
Platelets: 274 10*3/uL (ref 150–400)
RBC: 4.21 MIL/uL — AB (ref 4.22–5.81)
RDW: 14.2 % (ref 11.5–15.5)
WBC: 7.6 10*3/uL (ref 4.0–10.5)

## 2015-03-10 LAB — LACTATE DEHYDROGENASE: LDH: 197 U/L — ABNORMAL HIGH (ref 98–192)

## 2015-03-10 MED ORDER — HYDROMORPHONE HCL 4 MG PO TABS: 4.0000 mg | ORAL_TABLET | ORAL | Status: DC | PRN

## 2015-03-10 MED ORDER — DEXAMETHASONE 2 MG PO TABS: 2.0000 mg | ORAL_TABLET | Freq: Every morning | ORAL | Status: DC

## 2015-03-10 MED ORDER — POTASSIUM CHLORIDE ER 10 MEQ PO TBCR: 10.0000 meq | EXTENDED_RELEASE_TABLET | Freq: Every day | ORAL | Status: DC

## 2015-03-10 MED ORDER — POTASSIUM CHLORIDE CRYS ER 20 MEQ PO TBCR
40.0000 meq | EXTENDED_RELEASE_TABLET | Freq: Once | ORAL | Status: AC
  Administered 2015-03-10: 40 meq via ORAL
  Filled 2015-03-10: qty 2

## 2015-03-10 NOTE — Discharge Summary (Signed)
Patient ID: Perry Nielsen [id number] 74 y.o. October 19, 1940  03/05/2015  Discharge date and time: 03/10/2015   Admitting Physician: Excell Seltzer T  Discharge Physician: Excell Seltzer T  Admission Diagnoses: splenic mass  Discharge Diagnoses: B-cell lymphoma  Operations: Procedure(s): LAPAROSCOPIC SPLENECTOMY  Admission Condition: fair  Discharged Condition: fair  Indication for Admission: Patient is a 74 year old male with a recent diagnosis of a malignant process with bone and spine metastases resulting in foot drop and pain. Biopsies of these areas and bone marrow biopsy have been suggestive of an hematopoietic malignancy but diagnosis could not be established. PET/CT scan shows a 4.5 cm mass in the spleen which appears malignant. After discussion regarding options and surgery and risks detailed extensively elsewhere the patient is electively admitted for laparoscopic splenectomy for diagnosis.  Hospital Course: On the morning of admission the patient underwent an uneventful laparoscopic splenectomy. He generally tolerated the procedure well. On the first postoperative day he had mild pain. Some nausea. Hemoglobin remained stable. On the second postoperative day he had some leukocytosis up to 14,000 and felt nausea and began having diarrhea as well. He was tested for C. Difficile that was negative. Abdomen remained mildly tender. Over the next couple of days he improved significantly with resolution of his diarrhea and nausea. Pathology revealed a large B-cell lymphoma. The patient had a diffuse erythematous papular rash over his trunk and extremities prior to admission felt to be possibly a drug rash. He was on Decadron. This rash essentially remained unchanged during his hospitalization. He continued to improve and was felt ready for discharge on the fifth postoperative day. Abdomen is benign. Incisions healing well.  Consults: oncology   Disposition: Home  Patient  Instructions:    Medication List    TAKE these medications        aspirin EC 81 MG tablet  Take 81 mg by mouth every morning.     dexamethasone 2 MG tablet  Commonly known as:  DECADRON  Take 1 tablet (2 mg total) by mouth every morning.     famotidine 20 MG tablet  Commonly known as:  PEPCID  Take 20 mg by mouth 2 (two) times daily.     fish oil-omega-3 fatty acids 1000 MG capsule  Take 2 g by mouth every morning.     fluticasone 50 MCG/ACT nasal spray  Commonly known as:  FLONASE  Place 1 spray into both nostrils at bedtime.     HYDROmorphone 4 MG tablet  Commonly known as:  DILAUDID  Take 1 tablet (4 mg total) by mouth every 4 (four) hours as needed for severe pain.     OxyCODONE 10 mg T12a 12 hr tablet  Commonly known as:  OXYCONTIN  Take 1 tablet (10 mg total) by mouth every 12 (twelve) hours.     oxymetazoline 0.05 % nasal spray  Commonly known as:  AFRIN  Place 1 spray into both nostrils 2 (two) times daily as needed for congestion.     potassium chloride 10 MEQ tablet  Commonly known as:  K-DUR  Take 1 tablet (10 mEq total) by mouth daily.     sorbitol 70 % solution  Take 30 mLs by mouth 2 (two) times daily.     tetrahydrozoline 0.05 % ophthalmic solution  Place 1-2 drops into both eyes 4 (four) times daily as needed (for eye irritation).     traZODone 150 MG tablet  Commonly known as:  DESYREL  Take 1 tablet (150 mg total) by mouth  at bedtime.        Activity: activity as tolerated Diet: regular diet Wound Care: none needed  Follow-up:  With Dr. Malachy Mood in 5 days and Dr. Excell Seltzer in 2 weeks.  Signed: Edward Jolly MD, FACS  03/10/2015, 8:29 AM

## 2015-03-10 NOTE — Progress Notes (Signed)
Perry Nielsen feels well's morning. The rash is not any worse. It may be improving a little bit.  His no diarrhea. He is eating.  He still has the right foot drop. He has a brace on the right foot.  He's had no pruritus. He is on some Pepcid. I think perhaps it would be a good idea for him to take at home for the rash. Pepcid is a very potent antihistamine that will not make him tired.  I still think that this rash is a drug allergy. I would not think it's from his lymphoma. However, this is a possibility. I think the way we would really know would be with a biopsy. I don't think that has to be done.  His potassium is a little bit low at 3.1. I will order a dose of oral potassium. The rest of his labs look okay. His white cell count 7.6. Hemoglobin 12.8 and platelet count 274,000.  On his physical exam, his vital signs were all stable. Temperature 99.2. Pulse 81. Blood pressure 125/77. Head exam shows no ocular or oral lesions. He has no adenopathy in the neck. Lungs are clear. Cardiac exam regular rate and rhythm with no murmurs, rubs or bruits. Abdomen is soft. He has good bowel sounds. There is no fluid wave. There is no palpable hepatosplenomegaly. Extremities shows the right foot drop. Skin exam does show the diffuse macular rash, mostly on his trunk. There are some areas on his extremities. It does not look as red. It is not vesicular. Neurological exam is nonfocal outside of the right foot drop.  From my point of view, he can be discharged. I think that he is set up for chemotherapy in the office next week. I think this is on September 9. He is supposed to be following with Perry Nielsen.  If he has any problems this week, he can always call us.  [place] 55:22

## 2015-03-10 NOTE — Progress Notes (Signed)
Patient ID: Perry Iha., male   DOB: 03-06-1941, 74 y.o.   MRN: [id number] 5 Days Post-Op  Subjective: No complaints this morning. Rash is the same, itching or little. Denies any abdominal or GI complaints. Anxious to go home.  Objective: Vital signs in last 24 hours: Temp:  [98.7 F (37.1 C)-99.2 F (37.3 C)] 99.2 F (37.3 C) (09/03 2100) Pulse Rate:  [77-81] 81 (09/03 2100) Resp:  [18] 18 (09/03 2100) BP: (117-125)/(75-77) 125/77 mmHg (09/03 2100) SpO2:  [99 %-100 %] 99 % (09/03 2100) Last BM Date: 03/09/15  Intake/Output from previous day: 09/03 0701 - 09/04 0700 In: 2400 [I.V.:2400] Out: -  Intake/Output this shift:    General appearance: alert, cooperative and no distress Resp: clear to auscultation bilaterally GI: normal findings: soft, non-tender Skin: erythematous confluent rash over her trunk unchanged Incision/Wound: clean and dry without evidence of infection  Lab Results:   Recent Labs  03/09/15 0505 03/10/15 0534  WBC 8.5 7.6  HGB 12.2* 12.8*  HCT 37.3* 38.6*  PLT 230 274   BMET  Recent Labs  03/08/15 0547 03/10/15 0534  NA 136 134*  K 4.0 3.1*  CL 100* 100*  CO2 28 27  GLUCOSE 124* 85  BUN 9 9  CREATININE 0.80 0.78  CALCIUM 8.2* 8.0*     Studies/Results: No results found.  Anti-infectives: Anti-infectives    Start     Dose/Rate Route Frequency Ordered Stop   03/05/15 0644  ceFAZolin (ANCEF) IVPB 2 g/50 mL premix     2 g 100 mL/hr over 30 Minutes Intravenous On call to O.R. 03/05/15 6010 03/05/15 0840      Assessment/Plan: s/p Procedure(s): LAPAROSCOPIC SPLENECTOMY B-cell lymphoma diagnosed on splenic tissue Okay for discharge today. Hypokalemia-receiving replacement today and will go home on short prescription Has follow-up with oncology next week to begin chemotherapy. Rash, questionable drug reaction versus neoplastic syndrome, stable   LOS: 5 days    Magen Suriano T 03/10/2015

## 2015-03-10 NOTE — Progress Notes (Signed)
Reviewed d/c instructions with pt and spouse including medications, incision care, precautions and follow up. Pt closing his eyes and inattentive, but spouse verbalizing good understanding of all instructions. Pt verbalizing his eagerness to go home.  Pt d/c to home into care of spouse.

## 2015-03-10 NOTE — Discharge Instructions (Signed)
[facility] ______[facility] °LAPAROSCOPIC SURGERY: POST OP INSTRUCTIONS °Always review your discharge instruction sheet given to you by the facility where your surgery was performed. °IF YOU HAVE DISABILITY OR FAMILY LEAVE FORMS, YOU MUST BRING THEM TO THE OFFICE FOR PROCESSING.   °DO NOT GIVE THEM TO YOUR DOCTOR. ° °1. A prescription for pain medication may be given to you upon discharge.  Take your pain medication as prescribed, if needed.  If narcotic pain medicine is not needed, then you may take acetaminophen (Tylenol) or ibuprofen (Advil) as needed. °2. Take your usually prescribed medications unless otherwise directed. °3. If you need a refill on your pain medication, please contact your pharmacy.  They will contact our office to request authorization. Prescriptions will not be filled after 5pm or on week-ends. °4. You should follow a light diet the first few days after arrival home, such as soup and crackers, etc.  Be sure to include lots of fluids daily. °5. Most patients will experience some swelling and bruising in the area of the incisions.  Ice packs will help.  Swelling and bruising can take several days to resolve.  °6. It is common to experience some constipation if taking pain medication after surgery.  Increasing fluid intake and taking a stool softener (such as Colace) will usually help or prevent this problem from occurring.  A mild laxative (Milk of Magnesia or Miralax) should be taken according to package instructions if there are no bowel movements after 48 hours. °7. Unless discharge instructions indicate otherwise, you may remove your bandages 24-48 hours after surgery, and you may shower at that time.  You may have steri-strips (small skin tapes) in place directly over the incision.  These strips should be left on the skin for 7-10 days.  If your surgeon used skin glue on the incision, you may shower in 24 hours.  The glue will flake off over the next 2-3 weeks.  Any sutures or  staples will be removed at the office during your follow-up visit. °8. ACTIVITIES:  You may resume regular (light) daily activities beginning the next day--such as daily self-care, walking, climbing stairs--gradually increasing activities as tolerated.  You may have sexual intercourse when it is comfortable.  Refrain from any heavy lifting or straining until approved by your doctor. °a. You may drive when you are no longer taking prescription pain medication, you can comfortably wear a seatbelt, and you can safely maneuver your car and apply brakes. °b. RETURN TO WORK:  __________________________________________________________ °9. You should see your doctor in the office for a follow-up appointment approximately 2-3 weeks after your surgery.  Make sure that you call for this appointment within a day or two after you arrive home to insure a convenient appointment time. °10. OTHER INSTRUCTIONS: __________________________________________________________________________________________________________________________ __________________________________________________________________________________________________________________________ °WHEN TO CALL YOUR DOCTOR: °1. Fever over 101.0 °2. Inability to urinate °3. Continued bleeding from incision. °4. Increased pain, redness, or drainage from the incision. °5. Increasing abdominal pain ° °The clinic staff is available to answer your questions during regular business hours.  Please don’t hesitate to call and ask to speak to one of the nurses for clinical concerns.  If you have a medical emergency, go to the nearest emergency room or call 911.  A surgeon from [facility] is always on call at the hospital. °[address], [address], [place], [place]  [address] ? [address] [address], [place], [place]   [address] °[phone] ? [phone] ? FAX [phone] °Web site:   [email or web address] °

## 2015-03-13 ENCOUNTER — Other Ambulatory Visit: Payer: Self-pay | Admitting: *Deleted

## 2015-03-13 ENCOUNTER — Telehealth: Payer: Self-pay | Admitting: Oncology

## 2015-03-13 NOTE — Telephone Encounter (Signed)
lvm for pt regarding to added appt. °

## 2015-03-14 ENCOUNTER — Ambulatory Visit (HOSPITAL_BASED_OUTPATIENT_CLINIC_OR_DEPARTMENT_OTHER): Payer: Medicare Other | Admitting: Nurse Practitioner

## 2015-03-14 ENCOUNTER — Other Ambulatory Visit: Payer: Medicare Other

## 2015-03-14 ENCOUNTER — Encounter: Payer: Self-pay | Admitting: *Deleted

## 2015-03-14 VITALS — BP 110/66 | HR 99 | Temp 98.3°F | Resp 18 | Ht 72.0 in | Wt 151.9 lb

## 2015-03-14 DIAGNOSIS — C8510 Unspecified B-cell lymphoma, unspecified site: Secondary | ICD-10-CM

## 2015-03-14 DIAGNOSIS — C8580 Other specified types of non-Hodgkin lymphoma, unspecified site: Secondary | ICD-10-CM

## 2015-03-14 MED ORDER — PROCHLORPERAZINE MALEATE 5 MG PO TABS: 5.0000 mg | ORAL_TABLET | Freq: Four times a day (QID) | ORAL | Status: DC | PRN

## 2015-03-14 MED ORDER — PREDNISONE 20 MG PO TABS: ORAL_TABLET | ORAL | Status: DC

## 2015-03-14 MED ORDER — ALLOPURINOL 300 MG PO TABS: 300.0000 mg | ORAL_TABLET | Freq: Every day | ORAL | Status: DC

## 2015-03-14 NOTE — Progress Notes (Signed)
  [facility] OFFICE PROGRESS NOTE   Diagnosis:  Non-Hodgkin's lymphoma  INTERVAL HISTORY:   Perry Nielsen returns as scheduled. He reports persistent right foot drop and right foot pain. Skin rash is resolving. No nausea or vomiting. No mouth sores. No diarrhea. No fevers or sweats.  Objective:  Vital signs in last 24 hours:  Blood pressure 110/66, pulse 99, temperature 98.3 F (36.8 C), temperature source Oral, resp. rate 18, height 6' (1.829 m), weight 151 lb 14.4 oz (68.901 kg), SpO2 97 %.    HEENT: No thrush or ulcers. Lymphatics: No palpable cervical, supra clavicular or axillary lymph nodes. Resp: Lungs clear bilaterally. Cardio: Regular rate and rhythm. GI: Abdomen soft. Healed surgical incisions. Vascular: Edema right lower leg. Trace edema left lower leg. Skin: Fading erythematous maculopapular rash scattered over the trunk.   Lab Results:  Lab Results  Component Value Date   WBC 7.6 03/10/2015   HGB 12.8* 03/10/2015   HCT 38.6* 03/10/2015   MCV 91.7 03/10/2015   PLT 274 03/10/2015   NEUTROABS 6.5 02/27/2015    Imaging:  No results found.  Medications: I have reviewed the patient's current medications.  Assessment/Plan: 1. 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 Thrombocytopenia 3. T7 compression fracture-likely pathologic 4. 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 Right foot pain-likely  secondary to nerve compression from the sacral mass 6. Diffuse erythematous rash, appearance consistent with a "drug "rash-improved 7. Low-grade fever likely secondary to atelectasis/postop fever versus tumor fever 8. Nausea, diarrhea-potentially related to sorbitol or C. Difficile. 9. Hepatitis B core antibody positive   Disposition: Perry Nielsen has been diagnosed with a large B-cell lymphoma, CD20 positive. The plan is to proceed with cycle 1 CHOP/Rituxan 03/15/2015. Potential toxicities were again reviewed. He is scheduled to attend a chemotherapy education class later today. He will receive Neulasta on 03/16/2015. Prescriptions were sent to his pharmacy for prednisone 80 mg daily for 5 days to begin on 03/15/2015, allopurinol 300 mg daily for 10 days to begin today and Compazine 5-10 mg every 6 hours as needed for nausea. He will return for a follow-up visit and labs on 03/29/2015. He and his wife understand to contact the office in the interim with any problems.  Patient seen with Dr. Benay Spice. 25 minutes were spent face-to-face at today's visit with the majority of that time involved in counseling/coordination of care.  Ned Card ANP/GNP-BC   03/14/2015  11:43 AM

## 2015-03-15 ENCOUNTER — Other Ambulatory Visit: Payer: Self-pay | Admitting: *Deleted

## 2015-03-15 ENCOUNTER — Ambulatory Visit (HOSPITAL_BASED_OUTPATIENT_CLINIC_OR_DEPARTMENT_OTHER): Payer: Medicare Other

## 2015-03-15 VITALS — BP 93/63 | HR 88 | Temp 97.9°F | Resp 20

## 2015-03-15 DIAGNOSIS — Z5111 Encounter for antineoplastic chemotherapy: Secondary | ICD-10-CM

## 2015-03-15 DIAGNOSIS — C8510 Unspecified B-cell lymphoma, unspecified site: Secondary | ICD-10-CM

## 2015-03-15 DIAGNOSIS — C8580 Other specified types of non-Hodgkin lymphoma, unspecified site: Secondary | ICD-10-CM

## 2015-03-15 MED ORDER — DOXORUBICIN HCL CHEMO IV INJECTION 2 MG/ML
50.0000 mg/m2 | Freq: Once | INTRAVENOUS | Status: AC
  Administered 2015-03-15: 94 mg via INTRAVENOUS
  Filled 2015-03-15: qty 47

## 2015-03-15 MED ORDER — FAMOTIDINE 20 MG PO TABS
20.0000 mg | ORAL_TABLET | Freq: Once | ORAL | Status: AC
  Administered 2015-03-15: 20 mg via ORAL

## 2015-03-15 MED ORDER — DIPHENHYDRAMINE HCL 25 MG PO CAPS
ORAL_CAPSULE | ORAL | Status: AC
  Filled 2015-03-15: qty 1

## 2015-03-15 MED ORDER — FAMOTIDINE 20 MG PO TABS
ORAL_TABLET | ORAL | Status: AC
  Filled 2015-03-15: qty 1

## 2015-03-15 MED ORDER — SODIUM CHLORIDE 0.9 % IV SOLN
Freq: Once | INTRAVENOUS | Status: AC
  Administered 2015-03-15: 10:00:00 via INTRAVENOUS

## 2015-03-15 MED ORDER — ACETAMINOPHEN 325 MG PO TABS
650.0000 mg | ORAL_TABLET | Freq: Once | ORAL | Status: AC
  Administered 2015-03-15: 650 mg via ORAL

## 2015-03-15 MED ORDER — SODIUM CHLORIDE 0.9 % IV SOLN
Freq: Once | INTRAVENOUS | Status: AC
  Administered 2015-03-15: 10:00:00 via INTRAVENOUS
  Filled 2015-03-15: qty 8

## 2015-03-15 MED ORDER — SODIUM CHLORIDE 0.9 % IV SOLN
375.0000 mg/m2 | Freq: Once | INTRAVENOUS | Status: AC
  Administered 2015-03-15: 700 mg via INTRAVENOUS
  Filled 2015-03-15: qty 50

## 2015-03-15 MED ORDER — CYCLOPHOSPHAMIDE CHEMO INJECTION 1 GM
750.0000 mg/m2 | Freq: Once | INTRAMUSCULAR | Status: AC
  Administered 2015-03-15: 1420 mg via INTRAVENOUS
  Filled 2015-03-15: qty 71

## 2015-03-15 MED ORDER — ACETAMINOPHEN 325 MG PO TABS
ORAL_TABLET | ORAL | Status: AC
  Filled 2015-03-15: qty 2

## 2015-03-15 MED ORDER — DIPHENHYDRAMINE HCL 25 MG PO CAPS
25.0000 mg | ORAL_CAPSULE | Freq: Once | ORAL | Status: AC
  Administered 2015-03-15: 25 mg via ORAL

## 2015-03-15 MED ORDER — VINCRISTINE SULFATE CHEMO INJECTION 1 MG/ML
2.0000 mg | Freq: Once | INTRAVENOUS | Status: AC
  Administered 2015-03-15: 2 mg via INTRAVENOUS
  Filled 2015-03-15: qty 2

## 2015-03-15 NOTE — Patient Instructions (Addendum)
[facility] Discharge Instructions for Patients Receiving Chemotherapy  Today you received the following chemotherapy agents Rituxan, Cytoxan, Vincristine, Doxyrubicin  To help prevent nausea and vomiting after your treatment, we encourage you to take your nausea medication     If you develop nausea and vomiting that is not controlled by your nausea medication, call the clinic.   BELOW ARE SYMPTOMS THAT SHOULD BE REPORTED IMMEDIATELY:  *FEVER GREATER THAN 100.5 F  *CHILLS WITH OR WITHOUT FEVER  NAUSEA AND VOMITING THAT IS NOT CONTROLLED WITH YOUR NAUSEA MEDICATION  *UNUSUAL SHORTNESS OF BREATH  *UNUSUAL BRUISING OR BLEEDING  TENDERNESS IN MOUTH AND THROAT WITH OR WITHOUT PRESENCE OF ULCERS  *URINARY PROBLEMS  *BOWEL PROBLEMS  UNUSUAL RASH Items with * indicate a potential emergency and should be followed up as soon as possible.  Feel free to call the clinic you have any questions or concerns. The clinic phone number is [phone].  Please show the [place] at check-in to the Emergency Department and triage nurse.  Rituximab injection What is this medicine? RITUXIMAB (ri TUX i mab) is a monoclonal antibody. This medicine changes the way the body's immune system works. It is used commonly to treat non-Hodgkin's lymphoma and other conditions. In cancer cells, this drug targets a specific protein within cancer cells and stops the cancer cells from growing. It is also used to treat rhuematoid arthritis (RA). In RA, this medicine slow the inflammatory process and help reduce joint pain and swelling. This medicine is often used with other cancer or arthritis medications. This medicine may be used for other purposes; ask your health care provider or pharmacist if you have questions. COMMON BRAND NAME(S): Rituxan What should I tell my health care provider before I take this medicine? They need to know if you have any of these conditions: -blood  disorders -heart disease -history of hepatitis B -infection (especially a virus infection such as chickenpox, cold sores, or herpes) -irregular heartbeat -kidney disease -lung or breathing disease, like asthma -lupus -an unusual or allergic reaction to rituximab, mouse proteins, other medicines, foods, dyes, or preservatives -pregnant or trying to get pregnant -breast-feeding How should I use this medicine? This medicine is for infusion into a vein. It is administered in a hospital or clinic by a specially trained health care professional. A special MedGuide will be given to you by the pharmacist with each prescription and refill. Be sure to read this information carefully each time. Talk to your pediatrician regarding the use of this medicine in children. This medicine is not approved for use in children. Overdosage: If you think you have taken too much of this medicine contact a poison control center or emergency room at once. NOTE: This medicine is only for you. Do not share this medicine with others. What if I miss a dose? It is important not to miss a dose. Call your doctor or health care professional if you are unable to keep an appointment. What may interact with this medicine? -cisplatin -medicines for blood pressure -some other medicines for arthritis -vaccines This list may not describe all possible interactions. Give your health care provider a list of all the medicines, herbs, non-prescription drugs, or dietary supplements you use. Also tell them if you smoke, drink alcohol, or use illegal drugs. Some items may interact with your medicine. What should I watch for while using this medicine? Report any side effects that you notice during your treatment right away, such as changes in your breathing,  fever, chills, dizziness or lightheadedness. These effects are more common with the first dose. Visit your prescriber or health care professional for checks on your progress. You will  need to have regular blood work. Report any other side effects. The side effects of this medicine can continue after you finish your treatment. Continue your course of treatment even though you feel ill unless your doctor tells you to stop. Call your doctor or health care professional for advice if you get a fever, chills or sore throat, or other symptoms of a cold or flu. Do not treat yourself. This drug decreases your body's ability to fight infections. Try to avoid being around people who are sick. This medicine may increase your risk to bruise or bleed. Call your doctor or health care professional if you notice any unusual bleeding. Be careful brushing and flossing your teeth or using a toothpick because you may get an infection or bleed more easily. If you have any dental work done, tell your dentist you are receiving this medicine. Avoid taking products that contain aspirin, acetaminophen, ibuprofen, naproxen, or ketoprofen unless instructed by your doctor. These medicines may hide a fever. Do not become pregnant while taking this medicine. Women should inform their doctor if they wish to become pregnant or think they might be pregnant. There is a potential for serious side effects to an unborn child. Talk to your health care professional or pharmacist for more information. Do not breast-feed an infant while taking this medicine. What side effects may I notice from receiving this medicine? Side effects that you should report to your doctor or health care professional as soon as possible: -allergic reactions like skin rash, itching or hives, swelling of the face, lips, or tongue -low blood counts - this medicine may decrease the number of white blood cells, red blood cells and platelets. You may be at increased risk for infections and bleeding. -signs of infection - fever or chills, cough, sore throat, pain or difficulty passing urine -signs of decreased platelets or bleeding - bruising, pinpoint red  spots on the skin, black, tarry stools, blood in the urine -signs of decreased red blood cells - unusually weak or tired, fainting spells, lightheadedness -breathing problems -confused, not responsive -chest pain -fast, irregular heartbeat -feeling faint or lightheaded, falls -mouth sores -redness, blistering, peeling or loosening of the skin, including inside the mouth -stomach pain -swelling of the ankles, feet, or hands -trouble passing urine or change in the amount of urine Side effects that usually do not require medical attention (report to your doctor or other health care professional if they continue or are bothersome): -anxiety -headache -loss of appetite -muscle aches -nausea -night sweats This list may not describe all possible side effects. Call your doctor for medical advice about side effects. You may report side effects to FDA at 1-800-FDA-1088. Where should I keep my medicine? This drug is given in a hospital or clinic and will not be stored at home. NOTE: This sheet is a summary. It may not cover all possible information. If you have questions about this medicine, talk to your doctor, pharmacist, or health care provider.  2015, [employer] Standard. (2008-02-20 14:04:59)  Vincristine injection What is this medicine? VINCRISTINE (vin KRIS teen) is a chemotherapy drug. It slows the growth of cancer cells. This medicine is used to treat many types of cancer like Hodgkin's disease, leukemia, non-Hodgkin's lymphoma, neuroblastoma (brain cancer), rhabdomyosarcoma, and Wilms' tumor. This medicine may be used for other purposes; ask your health  care provider or pharmacist if you have questions. COMMON BRAND NAME(S): Oncovin, Vincasar PFS What should I tell my health care provider before I take this medicine? They need to know if you have any of these conditions: -blood disorders -gout -infection (especially chickenpox, cold sores, or herpes) -kidney disease -liver  disease -lung disease -nervous system disease like Charcot-Marie-Tooth (CMT) -recent or ongoing radiation therapy -an unusual or allergic reaction to vincristine, other chemotherapy agents, other medicines, foods, dyes, or preservatives -pregnant or trying to get pregnant -breast-feeding How should I use this medicine? This drug is given as an infusion into a vein. It is administered in a hospital or clinic by a specially trained health care professional. If you have pain, swelling, burning, or any unusual feeling around the site of your injection, tell your health care professional right away. Talk to your pediatrician regarding the use of this medicine in children. While this drug may be prescribed for selected conditions, precautions do apply. Overdosage: If you think you have taken too much of this medicine contact a poison control center or emergency room at once. NOTE: This medicine is only for you. Do not share this medicine with others. What if I miss a dose? It is important not to miss your dose. Call your doctor or health care professional if you are unable to keep an appointment. What may interact with this medicine? Do not take this medicine with any of the following medications: -itraconazole -mibefradil -voriconazole This medicine may also interact with the following medications: -cyclosporine -erythromycin -fluconazole -ketoconazole -medicines for HIV like delavirdine, efavirenz, nevirapine -medicines for seizures like ethotoin, fosphenotoin, phenytoin -medicines to increase blood counts like filgrastim, pegfilgrastim, sargramostim -other chemotherapy drugs like cisplatin, L-asparaginase, methotrexate, mitomycin, paclitaxel -pegaspargase -vaccines -zalcitabine, ddC Talk to your doctor or health care professional before taking any of these medicines: -acetaminophen -aspirin -ibuprofen -ketoprofen -naproxen This list may not describe all possible interactions. Give  your health care provider a list of all the medicines, herbs, non-prescription drugs, or dietary supplements you use. Also tell them if you smoke, drink alcohol, or use illegal drugs. Some items may interact with your medicine. What should I watch for while using this medicine? Your condition will be monitored carefully while you are receiving this medicine. You will need important blood work done while you are taking this medicine. This drug may make you feel generally unwell. This is not uncommon, as chemotherapy can affect healthy cells as well as cancer cells. Report any side effects. Continue your course of treatment even though you feel ill unless your doctor tells you to stop. In some cases, you may be given additional medicines to help with side effects. Follow all directions for their use. Call your doctor or health care professional for advice if you get a fever, chills or sore throat, or other symptoms of a cold or flu. Do not treat yourself. Avoid taking products that contain aspirin, acetaminophen, ibuprofen, naproxen, or ketoprofen unless instructed by your doctor. These medicines may hide a fever. Do not become pregnant while taking this medicine. Women should inform their doctor if they wish to become pregnant or think they might be pregnant. There is a potential for serious side effects to an unborn child. Talk to your health care professional or pharmacist for more information. Do not breast-feed an infant while taking this medicine. Men may have a lower sperm count while taking this medicine. Talk to your doctor if you plan to father a child. What side effects may  I notice from receiving this medicine? Side effects that you should report to your doctor or health care professional as soon as possible: -allergic reactions like skin rash, itching or hives, swelling of the face, lips, or tongue -breathing problems -confusion or changes in emotions or moods -constipation -cough -mouth  sores -muscle weakness -nausea and vomiting -pain, swelling, redness or irritation at the injection site -pain, tingling, numbness in the hands or feet -problems with balance, talking, walking -seizures -stomach pain -trouble passing urine or change in the amount of urine Side effects that usually do not require medical attention (report to your doctor or health care professional if they continue or are bothersome): -diarrhea -hair loss -jaw pain -loss of appetite This list may not describe all possible side effects. Call your doctor for medical advice about side effects. You may report side effects to FDA at 1-800-FDA-1088. Where should I keep my medicine? This drug is given in a hospital or clinic and will not be stored at home. NOTE: This sheet is a summary. It may not cover all possible information. If you have questions about this medicine, talk to your doctor, pharmacist, or health care provider.  2015, [employer]/Gold Standard. (2008-03-19 17:17:13)  Doxorubicin injection What is this medicine? DOXORUBICIN (dox oh ROO bi sin) is a chemotherapy drug. It is used to treat many kinds of cancer like Hodgkin's disease, leukemia, non-Hodgkin's lymphoma, neuroblastoma, sarcoma, and Wilms' tumor. It is also used to treat bladder cancer, breast cancer, lung cancer, ovarian cancer, stomach cancer, and thyroid cancer. This medicine may be used for other purposes; ask your health care provider or pharmacist if you have questions. COMMON BRAND NAME(S): Adriamycin, Adriamycin PFS, Adriamycin RDF, Rubex What should I tell my health care provider before I take this medicine? They need to know if you have any of these conditions: -blood disorders -heart disease, recent heart attack -infection (especially a virus infection such as chickenpox, cold sores, or herpes) -irregular heartbeat -liver disease -recent or ongoing radiation therapy -an unusual or allergic reaction to doxorubicin, other  chemotherapy agents, other medicines, foods, dyes, or preservatives -pregnant or trying to get pregnant -breast-feeding How should I use this medicine? This drug is given as an infusion into a vein. It is administered in a hospital or clinic by a specially trained health care professional. If you have pain, swelling, burning or any unusual feeling around the site of your injection, tell your health care professional right away. Talk to your pediatrician regarding the use of this medicine in children. Special care may be needed. Overdosage: If you think you have taken too much of this medicine contact a poison control center or emergency room at once. NOTE: This medicine is only for you. Do not share this medicine with others. What if I miss a dose? It is important not to miss your dose. Call your doctor or health care professional if you are unable to keep an appointment. What may interact with this medicine? Do not take this medicine with any of the following medications: -cisapride -droperidol -halofantrine -pimozide -zidovudine This medicine may also interact with the following medications: -chloroquine -chlorpromazine -clarithromycin -cyclophosphamide -cyclosporine -erythromycin -medicines for depression, anxiety, or psychotic disturbances -medicines for irregular heart beat like amiodarone, bepridil, dofetilide, encainide, flecainide, propafenone, quinidine -medicines for seizures like ethotoin, fosphenytoin, phenytoin -medicines for nausea, vomiting like dolasetron, ondansetron, palonosetron -medicines to increase blood counts like filgrastim, pegfilgrastim, sargramostim -methadone -methotrexate -pentamidine -progesterone -vaccines -verapamil Talk to your doctor or health care professional before taking  any of these medicines: -acetaminophen -aspirin -ibuprofen -ketoprofen -naproxen This list may not describe all possible interactions. Give your health care provider a  list of all the medicines, herbs, non-prescription drugs, or dietary supplements you use. Also tell them if you smoke, drink alcohol, or use illegal drugs. Some items may interact with your medicine. What should I watch for while using this medicine? Your condition will be monitored carefully while you are receiving this medicine. You will need important blood work done while you are taking this medicine. This drug may make you feel generally unwell. This is not uncommon, as chemotherapy can affect healthy cells as well as cancer cells. Report any side effects. Continue your course of treatment even though you feel ill unless your doctor tells you to stop. Your urine may turn red for a few days after your dose. This is not blood. If your urine is dark or brown, call your doctor. In some cases, you may be given additional medicines to help with side effects. Follow all directions for their use. Call your doctor or health care professional for advice if you get a fever, chills or sore throat, or other symptoms of a cold or flu. Do not treat yourself. This drug decreases your body's ability to fight infections. Try to avoid being around people who are sick. This medicine may increase your risk to bruise or bleed. Call your doctor or health care professional if you notice any unusual bleeding. Be careful brushing and flossing your teeth or using a toothpick because you may get an infection or bleed more easily. If you have any dental work done, tell your dentist you are receiving this medicine. Avoid taking products that contain aspirin, acetaminophen, ibuprofen, naproxen, or ketoprofen unless instructed by your doctor. These medicines may hide a fever. Men and women of childbearing age should use effective birth control methods while using taking this medicine. Do not become pregnant while taking this medicine. There is a potential for serious side effects to an unborn child. Talk to your health care  professional or pharmacist for more information. Do not breast-feed an infant while taking this medicine. Do not let others touch your urine or other body fluids for 5 days after each treatment with this medicine. Caregivers should wear latex gloves to avoid touching body fluids during this time. There is a maximum amount of this medicine you should receive throughout your life. The amount depends on the medical condition being treated and your overall health. Your doctor will watch how much of this medicine you receive in your lifetime. Tell your doctor if you have taken this medicine before. What side effects may I notice from receiving this medicine? Side effects that you should report to your doctor or health care professional as soon as possible: -allergic reactions like skin rash, itching or hives, swelling of the face, lips, or tongue -low blood counts - this medicine may decrease the number of white blood cells, red blood cells and platelets. You may be at increased risk for infections and bleeding. -signs of infection - fever or chills, cough, sore throat, pain or difficulty passing urine -signs of decreased platelets or bleeding - bruising, pinpoint red spots on the skin, black, tarry stools, blood in the urine -signs of decreased red blood cells - unusually weak or tired, fainting spells, lightheadedness -breathing problems -chest pain -fast, irregular heartbeat -mouth sores -nausea, vomiting -pain, swelling, redness at site where injected -pain, tingling, numbness in the hands or feet -swelling of  ankles, feet, or hands -unusual bleeding or bruising Side effects that usually do not require medical attention (report to your doctor or health care professional if they continue or are bothersome): -diarrhea -facial flushing -hair loss -loss of appetite -missed menstrual periods -nail discoloration or damage -red or watery eyes -red colored urine -stomach upset This list may not  describe all possible side effects. Call your doctor for medical advice about side effects. You may report side effects to FDA at 1-800-FDA-1088. Where should I keep my medicine? This drug is given in a hospital or clinic and will not be stored at home. NOTE: This sheet is a summary. It may not cover all possible information. If you have questions about this medicine, talk to your doctor, pharmacist, or health care provider.  2015, [employer] Standard. (2012-10-18 09:54:34)  Cyclophosphamide injection What is this medicine? CYCLOPHOSPHAMIDE (sye kloe FOSS fa mide) is a chemotherapy drug. It slows the growth of cancer cells. This medicine is used to treat many types of cancer like lymphoma, myeloma, leukemia, breast cancer, and ovarian cancer, to name a few. This medicine may be used for other purposes; ask your health care provider or pharmacist if you have questions. COMMON BRAND NAME(S): Cytoxan, Neosar What should I tell my health care provider before I take this medicine? They need to know if you have any of these conditions: -blood disorders -history of other chemotherapy -infection -kidney disease -liver disease -recent or ongoing radiation therapy -tumors in the bone marrow -an unusual or allergic reaction to cyclophosphamide, other chemotherapy, other medicines, foods, dyes, or preservatives -pregnant or trying to get pregnant -breast-feeding How should I use this medicine? This drug is usually given as an injection into a vein or muscle or by infusion into a vein. It is administered in a hospital or clinic by a specially trained health care professional. Talk to your pediatrician regarding the use of this medicine in children. Special care may be needed. Overdosage: If you think you have taken too much of this medicine contact a poison control center or emergency room at once. NOTE: This medicine is only for you. Do not share this medicine with others. What if I miss a  dose? It is important not to miss your dose. Call your doctor or health care professional if you are unable to keep an appointment. What may interact with this medicine? This medicine may interact with the following medications: -amiodarone -amphotericin B -azathioprine -certain antiviral medicines for HIV or AIDS such as protease inhibitors (e.g., indinavir, ritonavir) and zidovudine -certain blood pressure medications such as benazepril, captopril, enalapril, fosinopril, lisinopril, moexipril, monopril, perindopril, quinapril, ramipril, trandolapril -certain cancer medications such as anthracyclines (e.g., daunorubicin, doxorubicin), busulfan, cytarabine, paclitaxel, pentostatin, tamoxifen, trastuzumab -certain diuretics such as chlorothiazide, chlorthalidone, hydrochlorothiazide, indapamide, metolazone -certain medicines that treat or prevent blood clots like warfarin -certain muscle relaxants such as succinylcholine -cyclosporine -etanercept -indomethacin -medicines to increase blood counts like filgrastim, pegfilgrastim, sargramostim -medicines used as general anesthesia -metronidazole -natalizumab This list may not describe all possible interactions. Give your health care provider a list of all the medicines, herbs, non-prescription drugs, or dietary supplements you use. Also tell them if you smoke, drink alcohol, or use illegal drugs. Some items may interact with your medicine. What should I watch for while using this medicine? Visit your doctor for checks on your progress. This drug may make you feel generally unwell. This is not uncommon, as chemotherapy can affect healthy cells as well as cancer cells. Report any side  effects. Continue your course of treatment even though you feel ill unless your doctor tells you to stop. Drink water or other fluids as directed. Urinate often, even at night. In some cases, you may be given additional medicines to help with side effects. Follow all  directions for their use. Call your doctor or health care professional for advice if you get a fever, chills or sore throat, or other symptoms of a cold or flu. Do not treat yourself. This drug decreases your body's ability to fight infections. Try to avoid being around people who are sick. This medicine may increase your risk to bruise or bleed. Call your doctor or health care professional if you notice any unusual bleeding. Be careful brushing and flossing your teeth or using a toothpick because you may get an infection or bleed more easily. If you have any dental work done, tell your dentist you are receiving this medicine. You may get drowsy or dizzy. Do not drive, use machinery, or do anything that needs mental alertness until you know how this medicine affects you. Do not become pregnant while taking this medicine or for 1 year after stopping it. Women should inform their doctor if they wish to become pregnant or think they might be pregnant. Men should not father a child while taking this medicine and for 4 months after stopping it. There is a potential for serious side effects to an unborn child. Talk to your health care professional or pharmacist for more information. Do not breast-feed an infant while taking this medicine. This medicine may interfere with the ability to have a child. This medicine has caused ovarian failure in some women. This medicine has caused reduced sperm counts in some men. You should talk with your doctor or health care professional if you are concerned about your fertility. If you are going to have surgery, tell your doctor or health care professional that you have taken this medicine. What side effects may I notice from receiving this medicine? Side effects that you should report to your doctor or health care professional as soon as possible: -allergic reactions like skin rash, itching or hives, swelling of the face, lips, or tongue -low blood counts - this medicine may  decrease the number of white blood cells, red blood cells and platelets. You may be at increased risk for infections and bleeding. -signs of infection - fever or chills, cough, sore throat, pain or difficulty passing urine -signs of decreased platelets or bleeding - bruising, pinpoint red spots on the skin, black, tarry stools, blood in the urine -signs of decreased red blood cells - unusually weak or tired, fainting spells, lightheadedness -breathing problems -dark urine -dizziness -palpitations -swelling of the ankles, feet, hands -trouble passing urine or change in the amount of urine -weight gain -yellowing of the eyes or skin Side effects that usually do not require medical attention (report to your doctor or health care professional if they continue or are bothersome): -changes in nail or skin color -hair loss -missed menstrual periods -mouth sores -nausea, vomiting This list may not describe all possible side effects. Call your doctor for medical advice about side effects. You may report side effects to FDA at 1-800-FDA-1088. Where should I keep my medicine? This drug is given in a hospital or clinic and will not be stored at home. NOTE: This sheet is a summary. It may not cover all possible information. If you have questions about this medicine, talk to your doctor, pharmacist, or health care provider.  2015, Elsevier/Gold Standard. (2012-05-06 16:22:58)

## 2015-03-16 ENCOUNTER — Ambulatory Visit: Payer: Medicare Other

## 2015-03-18 ENCOUNTER — Telehealth: Payer: Self-pay | Admitting: Oncology

## 2015-03-18 ENCOUNTER — Ambulatory Visit (HOSPITAL_BASED_OUTPATIENT_CLINIC_OR_DEPARTMENT_OTHER): Payer: Medicare Other

## 2015-03-18 VITALS — BP 94/63 | HR 84 | Temp 98.2°F

## 2015-03-18 DIAGNOSIS — C8510 Unspecified B-cell lymphoma, unspecified site: Secondary | ICD-10-CM

## 2015-03-18 DIAGNOSIS — Z5189 Encounter for other specified aftercare: Secondary | ICD-10-CM

## 2015-03-18 DIAGNOSIS — C8580 Other specified types of non-Hodgkin lymphoma, unspecified site: Secondary | ICD-10-CM

## 2015-03-18 MED ORDER — PEGFILGRASTIM INJECTION 6 MG/0.6ML ~~LOC~~
6.0000 mg | PREFILLED_SYRINGE | Freq: Once | SUBCUTANEOUS | Status: AC
  Administered 2015-03-18: 6 mg via SUBCUTANEOUS
  Filled 2015-03-18: qty 0.6

## 2015-03-18 NOTE — Patient Instructions (Signed)
Pegfilgrastim injection What is this medicine? PEGFILGRASTIM (peg fil GRA stim) is a long-acting granulocyte colony-stimulating factor that stimulates the growth of neutrophils, a type of white blood cell important in the body's fight against infection. It is used to reduce the incidence of fever and infection in patients with certain types of cancer who are receiving chemotherapy that affects the bone marrow. This medicine may be used for other purposes; ask your health care provider or pharmacist if you have questions. COMMON BRAND NAME(S): Neulasta What should I tell my health care provider before I take this medicine? They need to know if you have any of these conditions: -latex allergy -ongoing radiation therapy -sickle cell disease -skin reactions to acrylic adhesives (On-Body Injector only) -an unusual or allergic reaction to pegfilgrastim, filgrastim, other medicines, foods, dyes, or preservatives -pregnant or trying to get pregnant -breast-feeding How should I use this medicine? This medicine is for injection under the skin. If you get this medicine at home, you will be taught how to prepare and give the pre-filled syringe or how to use the On-body Injector. Refer to the patient Instructions for Use for detailed instructions. Use exactly as directed. Take your medicine at regular intervals. Do not take your medicine more often than directed. It is important that you put your used needles and syringes in a special sharps container. Do not put them in a trash can. If you do not have a sharps container, call your pharmacist or healthcare provider to get one. Talk to your pediatrician regarding the use of this medicine in children. Special care may be needed. Overdosage: If you think you have taken too much of this medicine contact a poison control center or emergency room at once. NOTE: This medicine is only for you. Do not share this medicine with others. What if I miss a dose? It is  important not to miss your dose. Call your doctor or health care professional if you miss your dose. If you miss a dose due to an On-body Injector failure or leakage, a new dose should be administered as soon as possible using a single prefilled syringe for manual use. What may interact with this medicine? Interactions have not been studied. Give your health care provider a list of all the medicines, herbs, non-prescription drugs, or dietary supplements you use. Also tell them if you smoke, drink alcohol, or use illegal drugs. Some items may interact with your medicine. This list may not describe all possible interactions. Give your health care provider a list of all the medicines, herbs, non-prescription drugs, or dietary supplements you use. Also tell them if you smoke, drink alcohol, or use illegal drugs. Some items may interact with your medicine. What should I watch for while using this medicine? You may need blood work done while you are taking this medicine. If you are going to need a MRI, CT scan, or other procedure, tell your doctor that you are using this medicine (On-Body Injector only). What side effects may I notice from receiving this medicine? Side effects that you should report to your doctor or health care professional as soon as possible: -allergic reactions like skin rash, itching or hives, swelling of the face, lips, or tongue -dizziness -fever -pain, redness, or irritation at site where injected -pinpoint red spots on the skin -shortness of breath or breathing problems -stomach or side pain, or pain at the shoulder -swelling -tiredness -trouble passing urine Side effects that usually do not require medical attention (report to your doctor   or health care professional if they continue or are bothersome): -bone pain -muscle pain This list may not describe all possible side effects. Call your doctor for medical advice about side effects. You may report side effects to FDA at  1-800-FDA-1088. Where should I keep my medicine? Keep out of the reach of children. Store pre-filled syringes in a refrigerator between 2 and 8 degrees C (36 and 46 degrees F). Do not freeze. Keep in carton to protect from light. Throw away this medicine if it is left out of the refrigerator for more than 48 hours. Throw away any unused medicine after the expiration date. NOTE: This sheet is a summary. It may not cover all possible information. If you have questions about this medicine, talk to your doctor, pharmacist, or health care provider.  2015, Elsevier/Gold Standard. (2013-09-21 16:14:05)  

## 2015-03-18 NOTE — Telephone Encounter (Signed)
Per 9/9 pof added f/u to 9/19 lab. Left message for patient re new time for lab/BS 9/19 and mailed schedule. Other appointments remain the same.

## 2015-03-19 ENCOUNTER — Telehealth: Payer: Self-pay | Admitting: *Deleted

## 2015-03-19 NOTE — Telephone Encounter (Signed)
-----   Message from Azzie Glatter, RN sent at 03/15/2015  4:18 PM EDT ----- Regarding: "1st time chemotherapy---per Dr. Benay Spice' Patient received R-CHOP for the first time, per Dr. Benay Spice.  Tolerated treatment well.

## 2015-03-19 NOTE — Telephone Encounter (Signed)
Spoke with pt's wife re: first R-CHOP.  [name] wife reports "he is doing well" Denies fever, N/V; "eating and drinking fine" Also received Neulasta injection yesterday and is tolerating that well.  [name] wife verbalized understanding to call if any problems.

## 2015-03-24 ENCOUNTER — Encounter (HOSPITAL_COMMUNITY): Payer: Self-pay | Admitting: *Deleted

## 2015-03-24 ENCOUNTER — Emergency Department (HOSPITAL_COMMUNITY): Payer: Medicare Other

## 2015-03-24 ENCOUNTER — Observation Stay (HOSPITAL_COMMUNITY): Payer: Medicare Other

## 2015-03-24 ENCOUNTER — Inpatient Hospital Stay (HOSPITAL_COMMUNITY)
Admission: EM | Admit: 2015-03-24 | Discharge: 2015-03-27 | DRG: 071 | Disposition: A | Discharge status: Home Health | Payer: Medicare Other | Attending: Internal Medicine | Admitting: Internal Medicine

## 2015-03-24 DIAGNOSIS — C7951 Secondary malignant neoplasm of bone: Secondary | ICD-10-CM | POA: Diagnosis present

## 2015-03-24 DIAGNOSIS — Z9841 Cataract extraction status, right eye: Secondary | ICD-10-CM

## 2015-03-24 DIAGNOSIS — R4189 Other symptoms and signs involving cognitive functions and awareness: Secondary | ICD-10-CM | POA: Diagnosis not present

## 2015-03-24 DIAGNOSIS — D72829 Elevated white blood cell count, unspecified: Secondary | ICD-10-CM | POA: Diagnosis not present

## 2015-03-24 DIAGNOSIS — C8590 Non-Hodgkin lymphoma, unspecified, unspecified site: Secondary | ICD-10-CM

## 2015-03-24 DIAGNOSIS — L89302 Pressure ulcer of unspecified buttock, stage 2: Secondary | ICD-10-CM | POA: Diagnosis present

## 2015-03-24 DIAGNOSIS — F22 Delusional disorders: Secondary | ICD-10-CM | POA: Diagnosis not present

## 2015-03-24 DIAGNOSIS — K219 Gastro-esophageal reflux disease without esophagitis: Secondary | ICD-10-CM | POA: Diagnosis not present

## 2015-03-24 DIAGNOSIS — T451X5A Adverse effect of antineoplastic and immunosuppressive drugs, initial encounter: Secondary | ICD-10-CM | POA: Diagnosis present

## 2015-03-24 DIAGNOSIS — R4182 Altered mental status, unspecified: Secondary | ICD-10-CM | POA: Diagnosis not present

## 2015-03-24 DIAGNOSIS — Z961 Presence of intraocular lens: Secondary | ICD-10-CM | POA: Diagnosis not present

## 2015-03-24 DIAGNOSIS — Z6820 Body mass index (BMI) 20.0-20.9, adult: Secondary | ICD-10-CM | POA: Diagnosis not present

## 2015-03-24 DIAGNOSIS — Z8049 Family history of malignant neoplasm of other genital organs: Secondary | ICD-10-CM | POA: Diagnosis not present

## 2015-03-24 DIAGNOSIS — L270 Generalized skin eruption due to drugs and medicaments taken internally: Secondary | ICD-10-CM | POA: Diagnosis not present

## 2015-03-24 DIAGNOSIS — M21371 Foot drop, right foot: Secondary | ICD-10-CM | POA: Diagnosis present

## 2015-03-24 DIAGNOSIS — D696 Thrombocytopenia, unspecified: Secondary | ICD-10-CM | POA: Diagnosis not present

## 2015-03-24 DIAGNOSIS — Z9842 Cataract extraction status, left eye: Secondary | ICD-10-CM

## 2015-03-24 DIAGNOSIS — Z8249 Family history of ischemic heart disease and other diseases of the circulatory system: Secondary | ICD-10-CM

## 2015-03-24 DIAGNOSIS — G9340 Encephalopathy, unspecified: Secondary | ICD-10-CM | POA: Diagnosis not present

## 2015-03-24 DIAGNOSIS — R5082 Postprocedural fever: Secondary | ICD-10-CM | POA: Diagnosis present

## 2015-03-24 DIAGNOSIS — Z9081 Acquired absence of spleen: Secondary | ICD-10-CM | POA: Diagnosis present

## 2015-03-24 DIAGNOSIS — K567 Ileus, unspecified: Secondary | ICD-10-CM

## 2015-03-24 DIAGNOSIS — M21379 Foot drop, unspecified foot: Secondary | ICD-10-CM | POA: Diagnosis present

## 2015-03-24 DIAGNOSIS — Z85828 Personal history of other malignant neoplasm of skin: Secondary | ICD-10-CM

## 2015-03-24 DIAGNOSIS — I6789 Other cerebrovascular disease: Secondary | ICD-10-CM | POA: Diagnosis not present

## 2015-03-24 DIAGNOSIS — D6959 Other secondary thrombocytopenia: Secondary | ICD-10-CM | POA: Diagnosis not present

## 2015-03-24 DIAGNOSIS — D6481 Anemia due to antineoplastic chemotherapy: Secondary | ICD-10-CM | POA: Diagnosis not present

## 2015-03-24 DIAGNOSIS — C8510 Unspecified B-cell lymphoma, unspecified site: Secondary | ICD-10-CM | POA: Diagnosis not present

## 2015-03-24 DIAGNOSIS — E440 Moderate protein-calorie malnutrition: Secondary | ICD-10-CM | POA: Diagnosis present

## 2015-03-24 DIAGNOSIS — Z8042 Family history of malignant neoplasm of prostate: Secondary | ICD-10-CM

## 2015-03-24 DIAGNOSIS — C8580 Other specified types of non-Hodgkin lymphoma, unspecified site: Secondary | ICD-10-CM | POA: Diagnosis present

## 2015-03-24 DIAGNOSIS — Z791 Long term (current) use of non-steroidal anti-inflammatories (NSAID): Secondary | ICD-10-CM

## 2015-03-24 DIAGNOSIS — Z79891 Long term (current) use of opiate analgesic: Secondary | ICD-10-CM

## 2015-03-24 DIAGNOSIS — Z79899 Other long term (current) drug therapy: Secondary | ICD-10-CM

## 2015-03-24 DIAGNOSIS — G589 Mononeuropathy, unspecified: Secondary | ICD-10-CM | POA: Diagnosis not present

## 2015-03-24 DIAGNOSIS — M8448XA Pathological fracture, other site, initial encounter for fracture: Secondary | ICD-10-CM | POA: Diagnosis present

## 2015-03-24 DIAGNOSIS — R410 Disorientation, unspecified: Secondary | ICD-10-CM | POA: Diagnosis not present

## 2015-03-24 DIAGNOSIS — F4489 Other dissociative and conversion disorders: Secondary | ICD-10-CM | POA: Diagnosis not present

## 2015-03-24 DIAGNOSIS — M12371 Palindromic rheumatism, right ankle and foot: Secondary | ICD-10-CM | POA: Diagnosis not present

## 2015-03-24 DIAGNOSIS — Z888 Allergy status to other drugs, medicaments and biological substances status: Secondary | ICD-10-CM

## 2015-03-24 LAB — CBC WITH DIFFERENTIAL/PLATELET
Basophils Absolute: 0.2 10*3/uL — ABNORMAL HIGH (ref 0.0–0.1)
Basophils Relative: 2 %
EOS PCT: 0 %
Eosinophils Absolute: 0 10*3/uL (ref 0.0–0.7)
HEMATOCRIT: 36 % — AB (ref 39.0–52.0)
HEMOGLOBIN: 11.8 g/dL — AB (ref 13.0–17.0)
LYMPHS ABS: 0.8 10*3/uL (ref 0.7–4.0)
LYMPHS PCT: 11 %
MCH: 29.6 pg (ref 26.0–34.0)
MCHC: 32.8 g/dL (ref 30.0–36.0)
MCV: 90.5 fL (ref 78.0–100.0)
MONOS PCT: 10 %
Monocytes Absolute: 0.8 10*3/uL (ref 0.1–1.0)
Neutro Abs: 5.8 10*3/uL (ref 1.7–7.7)
Neutrophils Relative %: 77 %
Platelets: 107 10*3/uL — ABNORMAL LOW (ref 150–400)
RBC: 3.98 MIL/uL — AB (ref 4.22–5.81)
RDW: 14.4 % (ref 11.5–15.5)
WBC: 7.6 10*3/uL (ref 4.0–10.5)

## 2015-03-24 LAB — COMPREHENSIVE METABOLIC PANEL
ALBUMIN: 2.5 g/dL — AB (ref 3.5–5.0)
ALK PHOS: 56 U/L (ref 38–126)
ALT: 27 U/L (ref 17–63)
ANION GAP: 7 (ref 5–15)
AST: 16 U/L (ref 15–41)
BILIRUBIN TOTAL: 0.6 mg/dL (ref 0.3–1.2)
BUN: 12 mg/dL (ref 6–20)
CALCIUM: 8.3 mg/dL — AB (ref 8.9–10.3)
CO2: 29 mmol/L (ref 22–32)
CREATININE: 0.89 mg/dL (ref 0.61–1.24)
Chloride: 100 mmol/L — ABNORMAL LOW (ref 101–111)
GFR calc Af Amer: >60 mL/min (ref >60)
GFR calc non Af Amer: >60 mL/min (ref >60)
GLUCOSE: 89 mg/dL (ref 65–99)
Potassium: 3.7 mmol/L (ref 3.5–5.1)
SODIUM: 136 mmol/L (ref 135–145)
TOTAL PROTEIN: 5 g/dL — AB (ref 6.5–8.1)

## 2015-03-24 LAB — URINALYSIS, ROUTINE W REFLEX MICROSCOPIC
Bilirubin Urine: NEGATIVE
GLUCOSE, UA: NEGATIVE mg/dL
HGB URINE DIPSTICK: NEGATIVE
Ketones, ur: NEGATIVE mg/dL
LEUKOCYTES UA: NEGATIVE
Nitrite: NEGATIVE
Protein, ur: NEGATIVE mg/dL
SPECIFIC GRAVITY, URINE: 1.012 (ref 1.005–1.030)
UROBILINOGEN UA: 0.2 mg/dL (ref 0.0–1.0)
pH: 6 (ref 5.0–8.0)

## 2015-03-24 MED ORDER — LORAZEPAM 2 MG/ML IJ SOLN
0.5000 mg | Freq: Once | INTRAMUSCULAR | Status: AC
Start: 2015-03-24 — End: 2015-03-24
  Administered 2015-03-24: 0.5 mg via INTRAVENOUS
  Filled 2015-03-24: qty 1

## 2015-03-24 MED ORDER — GADOBENATE DIMEGLUMINE 529 MG/ML IV SOLN
15.0000 mL | Freq: Once | INTRAVENOUS | Status: AC | PRN
  Administered 2015-03-24: 13 mL via INTRAVENOUS

## 2015-03-24 NOTE — ED Notes (Addendum)
Per EMS, pt's wife complains pt has had confusion since yesterday. Pt was wandering in parking lot of their town home today. Pt has hx of non hodgkins lymphoma and spinal cancer. Pt alert, oriented to self and location. Pt unsure of what month it is.

## 2015-03-24 NOTE — ED Provider Notes (Signed)
CSN: [id number]     Arrival date & time 03/24/15  1555 History   First MD Initiated Contact with Patient 03/24/15 1630     No chief complaint on file.    (Consider location/radiation/quality/duration/timing/severity/associated sxs/prior Treatment) HPI Comments: Patient presents to the ER for evaluation of confusion. Patient reportedly became confused yesterday evening. Wife reports that he has not been behaving like himself. He has been very forgetful. She found him wandering around outside their town home today, did not where he was. Patient thinks that that occurred at one of his relatives townhomes, but he was at home all day. He relates a long story about watching a show big brother with his family at 1 AM this morning, this did not occur. Patient is without complaints himself.   Past Medical History  Diagnosis Date  . 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 ALLERGIC RHINITIS 08/03/2007  . GERD 02/18/2007  . KNEE PAIN, LEFT, CHRONIC 08/07/2008  . BACK PAIN 09/18/2009  . HAMMER TOE 08/03/2007  . CAROTID BRUIT, LEFT 08/07/2008  . 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 Foot drop     rt foot  . Rash, skin     torso  . Hepatitis 1977    no residual problems  . Sleeping difficulty   . Skin cancer   . Lymphoma    Past Surgical History  Procedure Laterality Date  . Shoulder open rotator cuff repair  07/06/05    right  . Knee arthroscopy      left  . Tonsillectomy and adenoidectomy    . Transurethral resection of prostate    . Cataract extraction w/ intraocular lens  implant, bilateral    . 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 Heart attack Other   . Aneurysm Other     aortic  . Coronary artery disease Sister   . Cancer Father     prostate  .  Pulmonary fibrosis Mother   . Uterine cancer Mother    Social History  Substance Use Topics  . Smoking status: Never Smoker   . Smokeless tobacco: None  . Alcohol Use: 0.0 oz/week    0 Standard drinks or equivalent per week     Comment: 2-3 times a week. Beer.     Review of Systems  Psychiatric/Behavioral: Positive for confusion.  All other systems reviewed and are negative.     Allergies  Antihistamines, diphenhydramine-type  Home Medications   Prior to Admission medications   Medication Sig Start Date End Date Taking? Authorizing Darneisha Windhorst  famotidine (PEPCID) 20 MG tablet Take 20 mg by mouth daily.    Yes Historical Zyad Boomer, MD  fluticasone (FLONASE) 50 MCG/ACT nasal spray Place 1 spray into both nostrils daily as needed for allergies.    Yes Historical Janaiya Beauchesne, MD  HYDROmorphone (DILAUDID) 4 MG tablet Take 1 tablet (4 mg total) by mouth every 4 (four) hours as needed for severe pain. 03/10/15  Yes Excell Seltzer, MD  ibuprofen (ADVIL,MOTRIN) 200 MG tablet Take 400 mg by mouth every 4 (four) hours as needed for fever, headache, mild pain, moderate pain or cramping.   Yes Historical Murray Guzzetta, MD  loratadine (CLARITIN) 10 MG tablet Take 10 mg by mouth daily.   Yes Historical Juli Odom, MD  oxymetazoline (AFRIN) 0.05 % nasal spray Place 1 spray into both nostrils 2 (two) times daily as needed for congestion.   Yes Historical Dannell Raczkowski, MD  pegfilgrastim (NEULASTA) 6 MG/0.6ML injection Inject 6 mg into the skin once.   Yes Historical Maverick Dieudonne, MD  [place]   Yes Historical Vaidehi Braddy, MD  prochlorperazine (COMPAZINE) 5 MG tablet Take 1-2 tablets (5-10 mg total) by mouth every 6 (six) hours as needed for nausea or vomiting. 03/14/15  Yes Owens Shark, NP  sorbitol 70 % solution Take 30 mLs by mouth 2 (two) times daily. 02/14/15  Yes Ladell Pier, MD  traZODone (DESYREL) 150 MG tablet Take 1 tablet (150 mg total) by mouth at bedtime. 10/23/14  Yes Dorena Cookey, MD  allopurinol (ZYLOPRIM) 300 MG tablet Take 1 tablet (300 mg total) by mouth daily. Begin today Patient not taking: Reported on 03/24/2015 03/14/15   Owens Shark, NP  dexamethasone (DECADRON) 2 MG tablet Take 1 tablet (2 mg total) by mouth every morning. Patient not taking: Reported on 03/24/2015 03/10/15   Excell Seltzer, MD  OxyCODONE (OXYCONTIN) 10 mg T12A 12 hr tablet Take 1 tablet (10 mg total) by mouth every 12 (twelve) hours. Patient not taking: Reported on 03/14/2015 02/18/15   Kyung Rudd, MD  potassium chloride (K-DUR) 10 MEQ tablet Take 1 tablet (10 mEq total) by mouth daily. 03/10/15 03/15/15  Excell Seltzer, MD  predniSONE (DELTASONE) 20 MG tablet Take 4 tabs (80 mg) daily for 5 days Patient not taking: Reported on 03/24/2015 03/14/15   Owens Shark, NP   BP 105/62 mmHg  Pulse 79  Temp(Src) 98.4 F (36.9 C) (Oral)  Resp 20  SpO2 98% Physical Exam  Constitutional: He is oriented to person, place, and time. He appears well-developed and well-nourished. No distress.  HENT:  Head: Normocephalic and atraumatic.  Right Ear: Hearing normal.  Left Ear: Hearing normal.  Nose: Nose normal.  Mouth/Throat: Oropharynx is clear and moist and mucous membranes are normal.  Eyes: Conjunctivae and EOM are normal. Pupils are equal, round, and reactive to light.  Neck: Normal range of motion. Neck supple.  Cardiovascular: Regular rhythm, S1 normal and S2 normal.  Exam reveals no gallop and no friction rub.   No murmur heard. Pulmonary/Chest: Effort normal and breath sounds normal. No respiratory distress. He exhibits no tenderness.  Abdominal: Soft. Normal appearance and bowel sounds are normal. There is no hepatosplenomegaly. There is no tenderness. There is no rebound, no guarding, no tenderness at McBurney's point and negative Murphy's sign. No hernia.  Musculoskeletal: Normal range of motion.  Neurological: He is alert and oriented to person, place, and time. He has normal strength. No  cranial nerve deficit or sensory deficit. Coordination normal. GCS eye subscore is 4. GCS verbal subscore is 5. GCS motor subscore is 6.  Patient oriented to person place and time, but is still exhibiting acute confusion. Patient thinks multiple things have occurred in the last day that have not occurred. He was also found wandering outside his home and did not know where he was earlier.  Skin: Skin is warm, dry and intact. No rash noted. No cyanosis.  Psychiatric: He has a normal mood and affect. His speech is normal and behavior is normal. Thought content normal.  Nursing note and vitals reviewed.   ED Course  Procedures (including critical care time) Labs Review Labs Reviewed  URINALYSIS, ROUTINE W REFLEX MICROSCOPIC (NOT AT [facility]) - Abnormal; Notable for the  following:    APPearance CLOUDY (*)    All other components within normal limits  CBC WITH DIFFERENTIAL/PLATELET - Abnormal; Notable for the following:    RBC 3.98 (*)    Hemoglobin 11.8 (*)    HCT 36.0 (*)    Platelets 107 (*)    Basophils Absolute 0.2 (*)    All other components within normal limits  COMPREHENSIVE METABOLIC PANEL - Abnormal; Notable for the following:    Chloride 100 (*)    Calcium 8.3 (*)    Total Protein 5.0 (*)    Albumin 2.5 (*)    All other components within normal limits  CBC WITH DIFFERENTIAL/PLATELET    Imaging Review Ct Head Wo Contrast  03/24/2015   CLINICAL DATA:  Confusion since yesterday. Non-Hodgkin's lymphoma. Initial encounter.  EXAM: CT HEAD WITHOUT CONTRAST  TECHNIQUE: Contiguous axial images were obtained from the base of the skull through the vertex without intravenous contrast.  COMPARISON:  None.  FINDINGS: There is no evidence of acute intracranial hemorrhage, mass lesion, brain edema or extra-axial fluid collection. The ventricles and subarachnoid spaces are mildly prominent, consistent with mild atrophy. There is no CT evidence of acute cortical infarction. There is mild  periventricular white matter disease, likely related to chronic small vessel ischemic changes. Intracranial vascular calcifications are noted.  The visualized paranasal sinuses, mastoid air cells and middle ears are clear. The calvarium is intact.  IMPRESSION: No acute intracranial findings. Mild atrophy and periventricular white matter disease.   Electronically Signed   By: Richardean Sale M.D.   On: 03/24/2015 20:33   I have personally reviewed and evaluated these images and lab results as part of my medical decision-making.   EKG Interpretation   Date/Time:  Sunday March 24 2015 16:08:10 EDT Ventricular Rate:  76 PR Interval:  169 QRS Duration: 94 QT Interval:  398 QTC Calculation: 447 R Axis:   16 Text Interpretation:  Sinus rhythm Low voltage, precordial leads  Nonspecific ST and T wave abnormality Confirmed by POLLINA  MD,  [place] [phone]) on 03/24/2015 7:23:51 PM      MDM   Final diagnoses:  None   acute encephalopathy Non-Hodgkin's lymphoma  Patient presented to the ER for evaluation of acute mental status changes. Patient suddenly became very confused last night and this continues today. This is a significant change for the patient. He was recently diagnosed with non-Hodgkin's lymphoma. He received first chemotherapy dose 10 days ago. He had been on Decadron, but is now off stair right. He is not expressing any headache. He has no focal neurologic findings other than the confusion and agitation. CT of his normal. Urine clear, no infection. Lab work unremarkable. Source of the patient's acute encephalopathy is unclear, will require MRI. Patient to be admitted to the hospital for further management.    Orpah Greek, MD 03/24/15 2154

## 2015-03-24 NOTE — ED Notes (Signed)
Bed: WA02 Expected date:  Expected time:  Means of arrival:  Comments: EMS - [place] pt/[facility]

## 2015-03-24 NOTE — ED Notes (Signed)
Pt refusing labs and CT. MD notified.

## 2015-03-25 ENCOUNTER — Ambulatory Visit: Payer: Medicare Other | Admitting: Oncology

## 2015-03-25 ENCOUNTER — Encounter (HOSPITAL_COMMUNITY): Payer: Self-pay | Admitting: Internal Medicine

## 2015-03-25 ENCOUNTER — Inpatient Hospital Stay (HOSPITAL_COMMUNITY)
Admit: 2015-03-25 | Discharge: 2015-03-25 | Disposition: A | Discharge status: Home / Self Care | Payer: Medicare Other | Attending: Internal Medicine | Admitting: Internal Medicine

## 2015-03-25 ENCOUNTER — Other Ambulatory Visit: Payer: Medicare Other

## 2015-03-25 ENCOUNTER — Inpatient Hospital Stay (HOSPITAL_COMMUNITY): Payer: Medicare Other

## 2015-03-25 DIAGNOSIS — G9340 Encephalopathy, unspecified: Secondary | ICD-10-CM | POA: Diagnosis not present

## 2015-03-25 DIAGNOSIS — Z85828 Personal history of other malignant neoplasm of skin: Secondary | ICD-10-CM | POA: Diagnosis not present

## 2015-03-25 DIAGNOSIS — D6959 Other secondary thrombocytopenia: Secondary | ICD-10-CM | POA: Diagnosis present

## 2015-03-25 DIAGNOSIS — Z888 Allergy status to other drugs, medicaments and biological substances status: Secondary | ICD-10-CM | POA: Diagnosis not present

## 2015-03-25 DIAGNOSIS — Z79891 Long term (current) use of opiate analgesic: Secondary | ICD-10-CM | POA: Diagnosis not present

## 2015-03-25 DIAGNOSIS — M12371 Palindromic rheumatism, right ankle and foot: Secondary | ICD-10-CM | POA: Diagnosis not present

## 2015-03-25 DIAGNOSIS — L270 Generalized skin eruption due to drugs and medicaments taken internally: Secondary | ICD-10-CM | POA: Diagnosis present

## 2015-03-25 DIAGNOSIS — D72829 Elevated white blood cell count, unspecified: Secondary | ICD-10-CM | POA: Diagnosis present

## 2015-03-25 DIAGNOSIS — R4182 Altered mental status, unspecified: Secondary | ICD-10-CM | POA: Diagnosis not present

## 2015-03-25 DIAGNOSIS — Z961 Presence of intraocular lens: Secondary | ICD-10-CM | POA: Diagnosis present

## 2015-03-25 DIAGNOSIS — Z9081 Acquired absence of spleen: Secondary | ICD-10-CM | POA: Diagnosis present

## 2015-03-25 DIAGNOSIS — Z6820 Body mass index (BMI) 20.0-20.9, adult: Secondary | ICD-10-CM | POA: Diagnosis not present

## 2015-03-25 DIAGNOSIS — L89302 Pressure ulcer of unspecified buttock, stage 2: Secondary | ICD-10-CM | POA: Diagnosis not present

## 2015-03-25 DIAGNOSIS — R410 Disorientation, unspecified: Secondary | ICD-10-CM | POA: Diagnosis present

## 2015-03-25 DIAGNOSIS — Z8249 Family history of ischemic heart disease and other diseases of the circulatory system: Secondary | ICD-10-CM | POA: Diagnosis not present

## 2015-03-25 DIAGNOSIS — M21371 Foot drop, right foot: Secondary | ICD-10-CM

## 2015-03-25 DIAGNOSIS — E440 Moderate protein-calorie malnutrition: Secondary | ICD-10-CM | POA: Diagnosis present

## 2015-03-25 DIAGNOSIS — Z791 Long term (current) use of non-steroidal anti-inflammatories (NSAID): Secondary | ICD-10-CM | POA: Diagnosis not present

## 2015-03-25 DIAGNOSIS — F22 Delusional disorders: Secondary | ICD-10-CM | POA: Diagnosis not present

## 2015-03-25 DIAGNOSIS — Z8049 Family history of malignant neoplasm of other genital organs: Secondary | ICD-10-CM | POA: Diagnosis not present

## 2015-03-25 DIAGNOSIS — C7951 Secondary malignant neoplasm of bone: Secondary | ICD-10-CM | POA: Diagnosis present

## 2015-03-25 DIAGNOSIS — D6481 Anemia due to antineoplastic chemotherapy: Secondary | ICD-10-CM | POA: Diagnosis present

## 2015-03-25 DIAGNOSIS — K567 Ileus, unspecified: Secondary | ICD-10-CM | POA: Diagnosis not present

## 2015-03-25 DIAGNOSIS — Z8042 Family history of malignant neoplasm of prostate: Secondary | ICD-10-CM | POA: Diagnosis not present

## 2015-03-25 DIAGNOSIS — T451X5A Adverse effect of antineoplastic and immunosuppressive drugs, initial encounter: Secondary | ICD-10-CM | POA: Diagnosis present

## 2015-03-25 DIAGNOSIS — D696 Thrombocytopenia, unspecified: Secondary | ICD-10-CM | POA: Diagnosis present

## 2015-03-25 DIAGNOSIS — Z9841 Cataract extraction status, right eye: Secondary | ICD-10-CM | POA: Diagnosis not present

## 2015-03-25 DIAGNOSIS — M8448XA Pathological fracture, other site, initial encounter for fracture: Secondary | ICD-10-CM | POA: Diagnosis present

## 2015-03-25 DIAGNOSIS — R5082 Postprocedural fever: Secondary | ICD-10-CM | POA: Diagnosis present

## 2015-03-25 DIAGNOSIS — Z79899 Other long term (current) drug therapy: Secondary | ICD-10-CM | POA: Diagnosis not present

## 2015-03-25 DIAGNOSIS — C8590 Non-Hodgkin lymphoma, unspecified, unspecified site: Secondary | ICD-10-CM | POA: Diagnosis not present

## 2015-03-25 DIAGNOSIS — C8510 Unspecified B-cell lymphoma, unspecified site: Secondary | ICD-10-CM

## 2015-03-25 DIAGNOSIS — Z9842 Cataract extraction status, left eye: Secondary | ICD-10-CM | POA: Diagnosis not present

## 2015-03-25 DIAGNOSIS — G589 Mononeuropathy, unspecified: Secondary | ICD-10-CM | POA: Diagnosis present

## 2015-03-25 DIAGNOSIS — K219 Gastro-esophageal reflux disease without esophagitis: Secondary | ICD-10-CM | POA: Diagnosis present

## 2015-03-25 DIAGNOSIS — R4189 Other symptoms and signs involving cognitive functions and awareness: Secondary | ICD-10-CM | POA: Diagnosis not present

## 2015-03-25 LAB — RAPID URINE DRUG SCREEN, HOSP PERFORMED
AMPHETAMINES: NOT DETECTED
Barbiturates: NOT DETECTED
Benzodiazepines: NOT DETECTED
Cocaine: NOT DETECTED
Opiates: POSITIVE — AB
Tetrahydrocannabinol: NOT DETECTED

## 2015-03-25 LAB — CBC WITH DIFFERENTIAL/PLATELET
BASOS ABS: 0.2 10*3/uL — AB (ref 0.0–0.1)
Basophils Relative: 2 %
EOS ABS: 0 10*3/uL (ref 0.0–0.7)
Eosinophils Relative: 0 %
HCT: 35.5 % — ABNORMAL LOW (ref 39.0–52.0)
HEMOGLOBIN: 12.1 g/dL — AB (ref 13.0–17.0)
LYMPHS ABS: 0.7 10*3/uL (ref 0.7–4.0)
Lymphocytes Relative: 6 %
MCH: 30.2 pg (ref 26.0–34.0)
MCHC: 34.1 g/dL (ref 30.0–36.0)
MCV: 88.5 fL (ref 78.0–100.0)
MONO ABS: 1.2 10*3/uL — AB (ref 0.1–1.0)
Monocytes Relative: 10 %
NEUTROS ABS: 9.9 10*3/uL — AB (ref 1.7–7.7)
Neutrophils Relative %: 82 %
PLATELETS: 108 10*3/uL — AB (ref 150–400)
RBC: 4.01 MIL/uL — ABNORMAL LOW (ref 4.22–5.81)
RDW: 14.5 % (ref 11.5–15.5)
WBC: 12 10*3/uL — ABNORMAL HIGH (ref 4.0–10.5)

## 2015-03-25 LAB — COMPREHENSIVE METABOLIC PANEL
ALBUMIN: 2.8 g/dL — AB (ref 3.5–5.0)
ALK PHOS: 61 U/L (ref 38–126)
ALT: 25 U/L (ref 17–63)
AST: 16 U/L (ref 15–41)
Anion gap: 8 (ref 5–15)
BILIRUBIN TOTAL: 0.6 mg/dL (ref 0.3–1.2)
BUN: 9 mg/dL (ref 6–20)
CALCIUM: 8.3 mg/dL — AB (ref 8.9–10.3)
CO2: 26 mmol/L (ref 22–32)
CREATININE: 0.73 mg/dL (ref 0.61–1.24)
Chloride: 103 mmol/L (ref 101–111)
GFR calc Af Amer: >60 mL/min (ref >60)
GLUCOSE: 112 mg/dL — AB (ref 65–99)
Potassium: 3.4 mmol/L — ABNORMAL LOW (ref 3.5–5.1)
Sodium: 137 mmol/L (ref 135–145)
TOTAL PROTEIN: 5.2 g/dL — AB (ref 6.5–8.1)

## 2015-03-25 LAB — BLOOD GAS, ARTERIAL
ACID-BASE EXCESS: 1.4 mmol/L (ref 0.0–2.0)
Bicarbonate: 24.1 mEq/L — ABNORMAL HIGH (ref 20.0–24.0)
Drawn by: 295031
FIO2: 0.21
O2 Saturation: 90.8 %
PATIENT TEMPERATURE: 98.6
PCO2 ART: 33 mmHg — AB (ref 35.0–45.0)
PO2 ART: 61.8 mmHg — AB (ref 80.0–100.0)
TCO2: 21.6 mmol/L (ref 0–100)
pH, Arterial: 7.478 — ABNORMAL HIGH (ref 7.350–7.450)

## 2015-03-25 LAB — FOLATE: Folate: 10.7 ng/mL (ref >5.9)

## 2015-03-25 LAB — VITAMIN B12: Vitamin B-12: 3517 pg/mL — ABNORMAL HIGH (ref 180–914)

## 2015-03-25 LAB — HIV ANTIBODY (ROUTINE TESTING W REFLEX): HIV SCREEN 4TH GENERATION: NONREACTIVE

## 2015-03-25 LAB — TSH: TSH: 2.456 u[IU]/mL (ref 0.350–4.500)

## 2015-03-25 LAB — AMMONIA: Ammonia: 20 umol/L (ref 9–35)

## 2015-03-25 MED ORDER — ACETAMINOPHEN 325 MG PO TABS
650.0000 mg | ORAL_TABLET | Freq: Four times a day (QID) | ORAL | Status: DC | PRN
  Administered 2015-03-27: 650 mg via ORAL
  Filled 2015-03-25: qty 2

## 2015-03-25 MED ORDER — FLUTICASONE PROPIONATE 50 MCG/ACT NA SUSP
1.0000 | Freq: Every day | NASAL | Status: DC | PRN
  Filled 2015-03-25: qty 16

## 2015-03-25 MED ORDER — ONDANSETRON HCL 4 MG/2ML IJ SOLN: 4.0000 mg | Freq: Four times a day (QID) | INTRAMUSCULAR | Status: DC | PRN

## 2015-03-25 MED ORDER — ACETAMINOPHEN 650 MG RE SUPP: 650.0000 mg | Freq: Four times a day (QID) | RECTAL | Status: DC | PRN

## 2015-03-25 MED ORDER — FAMOTIDINE 20 MG PO TABS
20.0000 mg | ORAL_TABLET | Freq: Every day | ORAL | Status: DC
  Administered 2015-03-25 – 2015-03-27 (×3): 20 mg via ORAL
  Filled 2015-03-25 (×3): qty 1

## 2015-03-25 MED ORDER — SODIUM CHLORIDE 0.9 % IV SOLN
INTRAVENOUS | Status: AC
  Administered 2015-03-25 (×2): via INTRAVENOUS

## 2015-03-25 MED ORDER — HYDROMORPHONE HCL 4 MG PO TABS
4.0000 mg | ORAL_TABLET | ORAL | Status: DC | PRN
  Administered 2015-03-25 – 2015-03-26 (×5): 4 mg via ORAL
  Filled 2015-03-25 (×6): qty 1

## 2015-03-25 MED ORDER — ENSURE ENLIVE PO LIQD
237.0000 mL | Freq: Two times a day (BID) | ORAL | Status: DC
  Administered 2015-03-25 – 2015-03-27 (×5): 237 mL via ORAL

## 2015-03-25 MED ORDER — ONDANSETRON HCL 4 MG PO TABS: 4.0000 mg | ORAL_TABLET | Freq: Four times a day (QID) | ORAL | Status: DC | PRN

## 2015-03-25 MED ORDER — ENOXAPARIN SODIUM 40 MG/0.4ML ~~LOC~~ SOLN
40.0000 mg | SUBCUTANEOUS | Status: DC
  Administered 2015-03-25 – 2015-03-27 (×3): 40 mg via SUBCUTANEOUS
  Filled 2015-03-25 (×3): qty 0.4

## 2015-03-25 MED ORDER — LORATADINE 10 MG PO TABS
10.0000 mg | ORAL_TABLET | Freq: Every day | ORAL | Status: DC
  Administered 2015-03-25 – 2015-03-27 (×3): 10 mg via ORAL
  Filled 2015-03-25 (×3): qty 1

## 2015-03-25 MED ORDER — DOCUSATE SODIUM 100 MG PO CAPS
100.0000 mg | ORAL_CAPSULE | Freq: Two times a day (BID) | ORAL | Status: DC
  Administered 2015-03-25 – 2015-03-27 (×6): 100 mg via ORAL
  Filled 2015-03-25 (×6): qty 1

## 2015-03-25 MED ORDER — TRAZODONE HCL 50 MG PO TABS
150.0000 mg | ORAL_TABLET | Freq: Every day | ORAL | Status: DC
  Administered 2015-03-25 – 2015-03-26 (×3): 150 mg via ORAL
  Filled 2015-03-25 (×3): qty 3

## 2015-03-25 NOTE — Addendum Note (Signed)
Encounter addended by: Kyung Rudd, MD on: 03/25/2015  6:39 PM<BR>     Documentation filed: Notes Section, Visit Diagnoses

## 2015-03-25 NOTE — Progress Notes (Signed)
Offsite EEG completed, results pending. 

## 2015-03-25 NOTE — Progress Notes (Signed)
  Radiation Oncology         [phone]) [phone] ________________________________  Name: Perry Nielsen. 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  Identity was confirmed.  All relevant records and images related to the planned course of therapy were reviewed.   Written consent to proceed with treatment was confirmed which was freely given after reviewing the details related to the planned course of therapy had been reviewed with the patient.  Then, the patient was set-up in a stable reproducible  supine position for radiation therapy.  CT images were obtained.  Surface markings were placed.    Medically necessary complex treatment device(s) for immobilization:  Customized vac lock bag.   The CT images were loaded into the planning software.  Then the target and avoidance structures were contoured.  Treatment planning then occurred.  The radiation prescription was entered and confirmed.  A total of 4 complex treatment devices were fabricated which relate to the designed radiation treatment fields. Each of these customized fields/ complex treatment devices will be used on a daily basis during the radiation course. I have requested : 3D Simulation  I have requested a DVH of the following structures: target volume, bowel, spinal cord.   PLAN:  The patient will receive 30 Gy in 10 fractions.  ________________________________   Jodelle Gross, MD, PhD

## 2015-03-25 NOTE — Progress Notes (Signed)
Initial Nutrition Assessment  DOCUMENTATION CODES:   Non-severe (moderate) malnutrition in context of chronic illness  INTERVENTION:  - Continue Ensure Enlive BID, each supplement provides 350 kcal and 20 grams of protein - Encourage PO intakes at meals and Ensure between meals - RD will continue to monitor for needs  NUTRITION DIAGNOSIS:   Increased nutrient needs related to catabolic illness, cancer and cancer related treatments as evidenced by estimated needs.  GOAL:   Patient will meet greater than or equal to 90% of their needs  MONITOR:   PO intake, Supplement acceptance, Weight trends, Labs, Skin, I & O's  REASON FOR ASSESSMENT:   Malnutrition Screening Tool  ASSESSMENT:   74 y.o. 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 As per patient's wife patient was found to be confused since last night and early this morning patient's neighbor found the patient was trying to get into the car and was talking nonsensical. Patient was brought to the ER and patient was found to be afebrile. Patient is mildly agitated initially and was given Ativan for which CT head was done which was unremarkable at this was followed by MRI of the brain which also did not show anything acute. On exam patient is still confused but is able to remember his oncologist and his recent diagnosis of his lymphoma and follows commands. Patient has been admitted for acute encephalopathy, cause of which is not known.   Pt seen for MST. BMI indicates normal weight status. Pt reports he had eggs, bacon, fruit, and fruit juice for breakfast this AM. Per chart review, he ate 50% of this meal. He states that his appetite was decreased PTA.   [name] wife is at bedside and states that pt's appetite has been fair to  poor the past 6 months during which time he lost 22 lbs from his UBW of 175 lbs. Current weight is 147 lbs and indicates 28 lb weight loss (16% body weight) in 6 months. Moderate muscle and fat wasting noted during assessment.   Pt states he drank 1/2 Ensure Enlive earlier today. He was drinking Ensure once/day most days PTA and states he usually likes this supplement as long as it is cold.   Pt remains confused at times during discussion. Will continue to monitor for needs; may need encouragement during meals.  Medications reviewed. Labs reviewed; K: 3.4 mmol/L, Ca: 8.3 mg/dL.    Diet Order:  Diet regular Room service appropriate?: Yes; Fluid consistency:: Thin  Skin:  Wound (see comment) (Stage 2 medial buttocks pressure ulcer)  Last BM:  9/17  Height:   Ht Readings from Last 1 Encounters:  03/24/15 6' (1.829 m)    Weight:   Wt Readings from Last 1 Encounters:  03/24/15 147 lb 11.3 oz (67 kg)    Ideal Body Weight:  80.91 kg (kg)  BMI:  Body mass index is 20.03 kg/(m^2).  Estimated Nutritional Needs:   Kcal:  1900-2100  Protein:  70-80 grams  Fluid:  2 L/day  EDUCATION NEEDS:   No education needs identified at this time     Jarome Matin, RD, LDN Inpatient Clinical Dietitian Pager # [phone] After hours/weekend pager # [phone]

## 2015-03-25 NOTE — Progress Notes (Signed)
PROGRESS NOTE  Dyanne Iha. UTM:[id number] DOB: Apr 26, 1941 DOA: 03/24/2015 PCP: Joycelyn Man, MD  HPI/Recap of past 73 hours: 74 year old male with past mental history of large cell lymphoma as well as sacral mass causing right foot drop status post initial chemotherapy on 9/9 brought into the emergency room on the evening of 9/18 by his wife after she noted him having acute agitation and confusion starting 9/17. Patient prior to this has been relatively appropriate only minimal confusion such as forgetting and naming occasionally. However starting Saturday, patient quite agitated, ruminating on sister-in-law and wife living in different house and other unclear untrue episodes. Took several members to calm patient down however the following day the symptoms recurred and patient was brought into the emergency room. CT scan and MRI negative. Lab work otherwise unremarkable for renal failure, electrolyte abnormality. Workup so far is unrevealing including normal thyroid function, B-12 and folate.  Patient himself is awake and interactive. According to his wife he still confused, but not as agitated. He is alert to self, location and who the president is, but not on date: Year is 3. When asked to do the months of the year backwards, he is able to stay somewhat focused on the task but does have some difficulty with this. He however continues to ruminate upon the same confusion and upsetting episodes about other family members living in different homes and his wife living in a apartment above the garage for the season not true according to wife)  Assessment/Plan: Principal Problem:   Acute encephalopathy: Very unclear etiology and looks to be sudden onset. Patient's medications would not account for this. For thoroughness sake we'll check heavy metal screen and ABG. Will likely need psychiatry and neurology assistance Active Problems:   Foot drop: Secondary to sacral mass.   Lymphoma,  large cell: Status post initial chemotherapy started 10 days ago   Thrombocytopenia: Stable   Decubitus ulcer of buttock, stage 2: Present on admission, secondary to prolonged bed rest from associated foot drop. Therapeutic mattress added. Appreciate wound care help.   Code Status: Full code  Family Communication: Wife at the bedside  Disposition Plan: Continue inpatient, hopefully cause will be apparent seen and can be treated   Consultants:  Will consult neurology  Psychiatry  Procedures:  None  Antibiotics:  None   Objective: BP 110/62 mmHg  Pulse 101  Temp(Src) 98.4 F (36.9 C) (Oral)  Resp 20  Ht 6' (1.829 m)  Wt 67 kg (147 lb 11.3 oz)  BMI 20.03 kg/m2  SpO2 92%  Intake/Output Summary (Last 24 hours) at 03/25/15 1433 Last data filed at 03/25/15 1016  Gross per 24 hour  Intake 838.33 ml  Output    600 ml  Net 238.33 ml   Filed Weights   03/24/15 2328  Weight: 67 kg (147 lb 11.3 oz)    Exam:   General:  Oriented 2, appropriately interactive  Cardiovascular: Regular rate and rhythm, S1-S2  Respiratory: Clear to auscultation bilaterally  Abdomen: Soft, nontender, nondistended, positive bowel sounds  Musculoskeletal: No clubbing or cyanosis, trace edema-with right foot drop   Data Reviewed: Basic Metabolic Panel:  Recent Labs Lab 03/24/15 1822 03/25/15 0138  NA 136 137  K 3.7 3.4*  CL 100* 103  CO2 29 26  GLUCOSE 89 112*  BUN 12 9  CREATININE 0.89 0.73  CALCIUM 8.3* 8.3*   Liver Function Tests:  Recent Labs Lab 03/24/15 1822 03/25/15 0138  AST 16 16  ALT 27  25  ALKPHOS 56 61  BILITOT 0.6 0.6  PROT 5.0* 5.2*  ALBUMIN 2.5* 2.8*   No results for input(s): LIPASE, AMYLASE in the last 168 hours.  Recent Labs Lab 03/25/15 0138  AMMONIA 20   CBC:  Recent Labs Lab 03/24/15 1822 03/25/15 0138  WBC 7.6 12.0*  NEUTROABS 5.8 9.9*  HGB 11.8* 12.1*  HCT 36.0* 35.5*  MCV 90.5 88.5  PLT 107* 108*   Cardiac Enzymes:   No  results for input(s): CKTOTAL, CKMB, CKMBINDEX, TROPONINI in the last 168 hours. BNP (last 3 results) No results for input(s): BNP in the last 8760 hours.  ProBNP (last 3 results) No results for input(s): PROBNP in the last 8760 hours.  CBG: No results for input(s): GLUCAP in the last 168 hours.  No results found for this or any previous visit (from the past 240 hour(s)).   Studies: Ct Head Wo Contrast  03/24/2015   CLINICAL DATA:  Confusion since yesterday. Non-Hodgkin's lymphoma. Initial encounter.  EXAM: CT HEAD WITHOUT CONTRAST  TECHNIQUE: Contiguous axial images were obtained from the base of the skull through the vertex without intravenous contrast.  COMPARISON:  None.  FINDINGS: There is no evidence of acute intracranial hemorrhage, mass lesion, brain edema or extra-axial fluid collection. The ventricles and subarachnoid spaces are mildly prominent, consistent with mild atrophy. There is no CT evidence of acute cortical infarction. There is mild periventricular white matter disease, likely related to chronic small vessel ischemic changes. Intracranial vascular calcifications are noted.  The visualized paranasal sinuses, mastoid air cells and middle ears are clear. The calvarium is intact.  IMPRESSION: No acute intracranial findings. Mild atrophy and periventricular white matter disease.   Electronically Signed   By: Richardean Sale M.D.   On: 03/24/2015 20:33   Mr Jeri Cos HK Contrast  03/24/2015   CLINICAL DATA:  Confusion beginning yesterday, altered mental status. 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  EXAM: MRI HEAD WITHOUT AND WITH CONTRAST  TECHNIQUE: Multiplanar, multiecho pulse sequences of the brain and surrounding structures were obtained without and with intravenous contrast.  CONTRAST:  40mL MULTIHANCE GADOBENATE DIMEGLUMINE 529 MG/ML IV SOLN  COMPARISON:  CT head March 24, 2015 at 2026 hours  FINDINGS: Mildly motion degraded examination.  The  ventricles and sulci are normal for patient's age. No suspicious parenchymal signal, mass lesions, mass effect. Scattered subcentimeter supratentorial white matter T2 hyperintensities. 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 No abnormal parenchymal enhancement. RIGHT frontal developmental venous anomaly. No reduced diffusion to suggest acute ischemia. No susceptibility artifact to suggest hemorrhage.  No abnormal extra-axial fluid collections. No extra-axial masses nor leptomeningeal enhancement. Normal major intracranial vascular flow voids seen at the skull base.  Status post bilateral ocular lens implants. No suspicious calvarial bone marrow signal. No abnormal sellar expansion. Craniocervical junction maintained. Visualized paranasal sinuses and mastoid air cells are well-aerated.  IMPRESSION: No acute intracranial process.  Involutional changes. Mild chronic small vessel ischemic disease. 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  RIGHT frontal developmental venous anomaly, benign finding.   Electronically Signed   By: Elon Alas M.D.   On: 03/24/2015 23:19   Dg Chest Port 1 View  03/25/2015   CLINICAL DATA:  Lymphoma.  EXAM: PORTABLE CHEST - 1 VIEW  COMPARISON:  03/07/2015.  FINDINGS: Mediastinum hilar structures are normal. Low lung volumes. Heart size stable. No pleural effusion or pneumothorax . No acute bony abnormality .  IMPRESSION: Low lung volumes. No acute cardiopulmonary disease noted. Chest  is stable from prior exam.   Electronically Signed   By: Marcello Moores  Register   On: 03/25/2015 07:53    Scheduled Meds: . docusate sodium  100 mg Oral BID  . enoxaparin (LOVENOX) injection  40 mg Subcutaneous Q24H  . famotidine  20 mg Oral Daily  . feeding supplement (ENSURE ENLIVE)  237 mL Oral BID BM  . loratadine  10 mg Oral Daily  . traZODone  150 mg Oral QHS    Continuous Infusions: . sodium chloride 100 mL/hr at 03/25/15 0105     Time spent: 25 minutes  [facility] Pager [phone]. If 7PM-7AM, please contact night-coverage at [email or web address], password [facility] 03/25/2015, 2:33 PM  LOS: 0 days

## 2015-03-25 NOTE — Progress Notes (Signed)
  Radiation Oncology         [phone]) [phone] ________________________________  Name: Perry Nielsen. 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  Narrative: The patient tolerated radiation treatment relatively well.     Plan: The patient has completed radiation treatment. The patient will return to radiation oncology clinic for routine followup in one month. I advised the patient to call or return sooner if they have any questions or concerns related to their recovery or treatment. ________________________________  Jodelle Gross, M.D., Ph.D.

## 2015-03-25 NOTE — Consult Note (Signed)
WOC wound consult note Reason for Consult: Partial thickness tissue loss on bilateral buttocks (intermittent tissue injury that presents towards the end of each "run" of chemotherapy, according to patient). Wound type: Chemical, friction  Pressure Ulcer POA: No Measurement:Total affected area that encompasses coccyx and extends to perianal area measures 9cm x 7cm with two areas of tissue loss over bilateral buttocks, L>R.  Left measures 5cm x 0.5cm x 0.2cm xnd right measures 3cm x 0.5cm x 0.2cm.   Wound ZOX:WRUEA bed is red, dry Drainage (amount, consistency, odor) None Periwound: Blanchable erythema Dressing procedure/placement/frequency: Patient reports that he is most comfortable on his back.  He has tumor- associated foot drop on the right and wears an orthopedic multipodus boot on the right foot. I have provided staff with a POC for skin recovery that entails cleansing and protection of the healing tissue.  Additionally, I will provide a therapeutic mattress with low air loss feature while here in acute care and a pressure redistribution chair pad that he may take home with him upon discharge. Patient expresses appreciation for interventions and reports that "he will try" to spend some time on his back. [facility] nursing team will not follow, but will remain available to this patient, the nursing and medical teams.  Please re-consult if needed. Thanks, Maudie Flakes, MSN, RN, [place], [place], [place] [phone])

## 2015-03-25 NOTE — Procedures (Signed)
ELECTROENCEPHALOGRAM REPORT   Patient: Perry Nielsen.        Age: 74 y.o.        Sex: male Referring Physician: Dr Maryland Pink Report Date:  03/25/2015        Interpreting Physician: Hulen Luster  History: Taysean Wager. is an 74 y.o. male admitted with AMS  Medications:  Scheduled: . docusate sodium  100 mg Oral BID  . enoxaparin (LOVENOX) injection  40 mg Subcutaneous Q24H  . famotidine  20 mg Oral Daily  . feeding supplement (ENSURE ENLIVE)  237 mL Oral BID BM  . loratadine  10 mg Oral Daily  . traZODone  150 mg Oral QHS    Conditions of Recording:  This is a 16 channel EEG carried out with the patient in the drowsy state.  Description:  The waking background activity consists of a low voltage, symmetrical, fairly well organized, mix of alpha and theta activity, seen from the parieto-occipital and posterior temporal regions.  No focal slowing or epileptiform activity noted.   Hyperventilation was not performed. Intermittent photic stimulation was not performed.  IMPRESSION: Abnormal EEG due to the presence of generalized slowing indicating a mild to moderate cerebral disturbance (encephalopathy). No epileptiform activity was noted.    Jim Like, DO [facility] [phone]  If 7pm- 7am, please page neurology on call as listed in [place]. 03/25/2015, 6:27 PM

## 2015-03-25 NOTE — H&P (Addendum)
[facility] History and Physical  Perry Nielsen. OHY:[id number] DOB: [birth year] DOA: 03/24/2015  Referring physician: Dr.Pollino. PCP: Joycelyn Man, MD  Specialists: Dr.Sherill. Oncologist.  Chief Complaint: Confusion.  History obtained from patient's wife.  HPI: Perry Nielsen. is a 74 y.o. 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 As per patient's wife patient was found to be confused since last night and early this morning patient's neighbor found the patient was trying to get into the car and was talking nonsensical. Patient was brought to the ER and patient was found to be afebrile. Patient is mildly agitated initially and was given Ativan for which CT head was done which was unremarkable at this was followed by MRI of the brain which also did not show anything acute. On exam patient is still confused but is able to remember his oncologist and his recent diagnosis of his lymphoma and follows commands. Patient has been admitted for acute encephalopathy, cause of which is not known.   Review of Systems: As presented in the history of presenting illness, rest negative.  Past Medical History  Diagnosis Date  . 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 ALLERGIC RHINITIS 08/03/2007  . GERD 02/18/2007  . KNEE PAIN, LEFT, CHRONIC 08/07/2008  . BACK PAIN 09/18/2009  . HAMMER TOE 08/03/2007  . CAROTID BRUIT, LEFT 08/07/2008  . 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 Foot drop     rt foot  . Rash, skin     torso  . Hepatitis 1977    no residual problems  . Sleeping difficulty   . Skin cancer   . Lymphoma    Past Surgical History  Procedure Laterality Date  . Shoulder open rotator cuff repair  07/06/05     right  . Knee arthroscopy      left  . Tonsillectomy and adenoidectomy    . Transurethral resection of prostate    . Cataract extraction w/ intraocular lens  implant, bilateral    . 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 He does not have any smokeless tobacco history on file. He reports that he drinks alcohol. He reports that he does not use illicit drugs. Where does patient live home. Can patient participate in ADLs? Yes.  Allergies  Allergen Reactions  . Antihistamines, Diphenhydramine-Type     CAUSES PROSTATE TO SWELL & as a child had hyperactivity when took Benadryl Pt took Pepcid w/ Benadryl PO and did fine per Pt & his wife    Family History:  Family History  Problem Relation Age of Onset  . Heart attack Other   . Aneurysm Other     aortic  . Coronary artery disease Sister   . Cancer Father     prostate  . Pulmonary fibrosis Mother   . Uterine cancer Mother       Prior to Admission medications   Medication Sig Start Date End Date Taking? Authorizing Provider  famotidine (PEPCID) 20 MG tablet Take 20 mg by mouth daily.    Yes Historical Provider, MD  fluticasone (FLONASE) 50 MCG/ACT nasal spray Place 1 spray into both nostrils daily as needed for allergies.    Yes Historical Provider, MD  HYDROmorphone (DILAUDID) 4 MG tablet Take 1 tablet (4 mg total) by mouth every 4 (four) hours as needed for severe pain. 03/10/15  Yes Excell Seltzer, MD  ibuprofen (ADVIL,MOTRIN) 200 MG tablet Take 400 mg by mouth every 4 (four) hours as needed for fever, headache, mild pain, moderate pain or cramping.   Yes Historical Provider, MD  loratadine (CLARITIN) 10 MG tablet Take 10 mg by mouth daily.   Yes Historical Provider, MD  oxymetazoline (AFRIN) 0.05 % nasal spray Place 1 spray into both nostrils 2 (two)  times daily as needed for congestion.   Yes Historical Provider, MD  pegfilgrastim (NEULASTA) 6 MG/0.6ML injection Inject 6 mg into the skin once.   Yes Historical Provider, MD  [place]   Yes Historical Provider, MD  prochlorperazine (COMPAZINE) 5 MG tablet Take 1-2 tablets (5-10 mg total) by mouth every 6 (six) hours as needed for nausea or vomiting. 03/14/15  Yes Owens Shark, NP  sorbitol 70 % solution Take 30 mLs by mouth 2 (two) times daily. 02/14/15  Yes Ladell Pier, MD  traZODone (DESYREL) 150 MG tablet Take 1 tablet (150 mg total) by mouth at bedtime. 10/23/14  Yes Dorena Cookey, MD  allopurinol (ZYLOPRIM) 300 MG tablet Take 1 tablet (300 mg total) by mouth daily. Begin today Patient not taking: Reported on 03/24/2015 03/14/15   Owens Shark, NP  dexamethasone (DECADRON) 2 MG tablet Take 1 tablet (2 mg total) by mouth every morning. Patient not taking: Reported on 03/24/2015 03/10/15   Excell Seltzer, MD  OxyCODONE (OXYCONTIN) 10 mg T12A 12 hr tablet Take 1 tablet (10 mg total) by mouth every 12 (twelve) hours. Patient not taking: Reported on 03/14/2015 02/18/15   Kyung Rudd, MD  potassium chloride (K-DUR) 10 MEQ tablet Take 1 tablet (10 mEq total) by mouth daily. 03/10/15 03/15/15  Excell Seltzer, MD  predniSONE (DELTASONE) 20 MG tablet Take 4 tabs (80 mg) daily for 5 days Patient not taking: Reported on 03/24/2015 03/14/15   Owens Shark, NP    Physical Exam: Filed Vitals:   03/24/15 1614 03/24/15 1812 03/24/15 2328  BP: 108/61 105/62 127/72  Pulse: 75 79 84  Temp: 98.4 F (36.9 C)  97.6 F (36.4 C)  TempSrc: Oral  Oral  Resp: 20 20 20   Height:   6' (1.829 m)  Weight:   67 kg (147 lb 11.3 oz)  SpO2: 99% 98% 100%     General:  Moderately built and nourished.  Eyes: Anicteric no pallor.  ENT: No discharge from the ears eyes nose and mouth.  Neck: No mass felt.  Cardiovascular: S1 and S2 heard.  Respiratory: No rhonchi or  crepitations.  Abdomen: Soft nontender bowel sounds present.  Skin: No rash.  Musculoskeletal: Right foot drop.  Psychiatric: Appears confused.  Neurologic: Appears confused.  Labs on Admission:  Basic Metabolic Panel:  Recent Labs Lab 03/24/15 1822  NA 136  K 3.7  CL 100*  CO2 29  GLUCOSE 89  BUN 12  CREATININE 0.89  CALCIUM 8.3*   Liver Function Tests:  Recent Labs Lab 03/24/15 1822  AST 16  ALT 27  ALKPHOS 56  BILITOT 0.6  PROT 5.0*  ALBUMIN 2.5*   No results for input(s): LIPASE, AMYLASE in the last 168 hours. No results for input(s): AMMONIA in  the last 168 hours. CBC:  Recent Labs Lab 03/24/15 1822  WBC 7.6  NEUTROABS 5.8  HGB 11.8*  HCT 36.0*  MCV 90.5  PLT 107*   Cardiac Enzymes: No results for input(s): CKTOTAL, CKMB, CKMBINDEX, TROPONINI in the last 168 hours.  BNP (last 3 results) No results for input(s): BNP in the last 8760 hours.  ProBNP (last 3 results) No results for input(s): PROBNP in the last 8760 hours.  CBG: No results for input(s): GLUCAP in the last 168 hours.  Radiological Exams on Admission: Ct Head Wo Contrast  03/24/2015   CLINICAL DATA:  Confusion since yesterday. Non-Hodgkin's lymphoma. Initial encounter.  EXAM: CT HEAD WITHOUT CONTRAST  TECHNIQUE: Contiguous axial images were obtained from the base of the skull through the vertex without intravenous contrast.  COMPARISON:  None.  FINDINGS: There is no evidence of acute intracranial hemorrhage, mass lesion, brain edema or extra-axial fluid collection. The ventricles and subarachnoid spaces are mildly prominent, consistent with mild atrophy. There is no CT evidence of acute cortical infarction. There is mild periventricular white matter disease, likely related to chronic small vessel ischemic changes. Intracranial vascular calcifications are noted.  The visualized paranasal sinuses, mastoid air cells and middle ears are clear. The calvarium is intact.  IMPRESSION: No acute  intracranial findings. Mild atrophy and periventricular white matter disease.   Electronically Signed   By: Richardean Sale M.D.   On: 03/24/2015 20:33   Mr Jeri Cos LZ Contrast  03/24/2015   CLINICAL DATA:  Confusion beginning yesterday, altered mental status. 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  EXAM: MRI HEAD WITHOUT AND WITH CONTRAST  TECHNIQUE: Multiplanar, multiecho pulse sequences of the brain and surrounding structures were obtained without and with intravenous contrast.  CONTRAST:  35mL MULTIHANCE GADOBENATE DIMEGLUMINE 529 MG/ML IV SOLN  COMPARISON:  CT head March 24, 2015 at 2026 hours  FINDINGS: Mildly motion degraded examination.  The ventricles and sulci are normal for patient's age. No suspicious parenchymal signal, mass lesions, mass effect. Scattered subcentimeter supratentorial white matter T2 hyperintensities. 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 No abnormal parenchymal enhancement. RIGHT frontal developmental venous anomaly. No reduced diffusion to suggest acute ischemia. No susceptibility artifact to suggest hemorrhage.  No abnormal extra-axial fluid collections. No extra-axial masses nor leptomeningeal enhancement. Normal major intracranial vascular flow voids seen at the skull base.  Status post bilateral ocular lens implants. No suspicious calvarial bone marrow signal. No abnormal sellar expansion. Craniocervical junction maintained. Visualized paranasal sinuses and mastoid air cells are well-aerated.  IMPRESSION: No acute intracranial process.  Involutional changes. Mild chronic small vessel ischemic disease. 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  RIGHT frontal developmental venous anomaly, benign finding.   Electronically Signed   By: Elon Alas M.D.   On: 03/24/2015 23:19    EKG: Independently reviewed. Normal sinus rhythm with nonspecific ST-T changes.  Assessment/Plan Principal Problem:   Acute encephalopathy Active Problems:   Foot  drop   Lymphoma, large cell   Thrombocytopenia   Decubitus ulcer of buttock, stage 2   1. Acute encephalopathy - cause not clear. MRI brain has been unremarkable. We will check ammonia levels TSH RPR B2 levels and folate levels. Patient takes large doses of Dilaudid for pain but patient does not appear lethargic. Family states patient may not have missed his doses either. We will check urine drug screen. Closely observe. If mental status does not change may need neurology consult/psychiatric consult. I have also ordered EEG. 2. Buttock ulcer  stage II - wound team consult. 3. Anemia and thrombocytopenia - probably related to chemotherapy and lymphoma. Follow CBC. 4. Lymphoma - per oncology. 5. Right foot drop - probably secondary to sacral mass.  I have reviewed patient's old charts of labs. Chest x-ray is pending.   DVT Prophylaxis Lovenox.  Code Status: Full code.  Family Communication: Discussed with patient's wife.  Disposition Plan: Admit to inpatient.    Tyrea Froberg N. [facility] Pager [phone].  If 7PM-7AM, please contact night-coverage [email or web address] Password [facility] 03/25/2015, 12:29 AM

## 2015-03-26 ENCOUNTER — Inpatient Hospital Stay (HOSPITAL_COMMUNITY): Payer: Medicare Other

## 2015-03-26 ENCOUNTER — Telehealth: Payer: Self-pay | Admitting: *Deleted

## 2015-03-26 DIAGNOSIS — C8590 Non-Hodgkin lymphoma, unspecified, unspecified site: Secondary | ICD-10-CM

## 2015-03-26 DIAGNOSIS — R21 Rash and other nonspecific skin eruption: Secondary | ICD-10-CM

## 2015-03-26 DIAGNOSIS — M12371 Palindromic rheumatism, right ankle and foot: Secondary | ICD-10-CM

## 2015-03-26 DIAGNOSIS — R4182 Altered mental status, unspecified: Secondary | ICD-10-CM

## 2015-03-26 DIAGNOSIS — M79671 Pain in right foot: Secondary | ICD-10-CM

## 2015-03-26 DIAGNOSIS — R4189 Other symptoms and signs involving cognitive functions and awareness: Secondary | ICD-10-CM | POA: Diagnosis present

## 2015-03-26 DIAGNOSIS — F22 Delusional disorders: Secondary | ICD-10-CM

## 2015-03-26 DIAGNOSIS — D696 Thrombocytopenia, unspecified: Secondary | ICD-10-CM

## 2015-03-26 LAB — CBC
HCT: 36.1 % — ABNORMAL LOW (ref 39.0–52.0)
HEMOGLOBIN: 11.9 g/dL — AB (ref 13.0–17.0)
MCH: 29.7 pg (ref 26.0–34.0)
MCHC: 33 g/dL (ref 30.0–36.0)
MCV: 90 fL (ref 78.0–100.0)
Platelets: 131 10*3/uL — ABNORMAL LOW (ref 150–400)
RBC: 4.01 MIL/uL — ABNORMAL LOW (ref 4.22–5.81)
RDW: 15.3 % (ref 11.5–15.5)
WBC: 17.9 10*3/uL — AB (ref 4.0–10.5)

## 2015-03-26 LAB — RPR: RPR: NONREACTIVE

## 2015-03-26 LAB — BASIC METABOLIC PANEL
ANION GAP: 8 (ref 5–15)
BUN: <5 mg/dL — ABNORMAL LOW (ref 6–20)
CALCIUM: 8.1 mg/dL — AB (ref 8.9–10.3)
CO2: 26 mmol/L (ref 22–32)
Chloride: 104 mmol/L (ref 101–111)
Creatinine, Ser: 0.74 mg/dL (ref 0.61–1.24)
GLUCOSE: 104 mg/dL — AB (ref 65–99)
POTASSIUM: 3.4 mmol/L — AB (ref 3.5–5.1)
SODIUM: 138 mmol/L (ref 135–145)

## 2015-03-26 NOTE — Progress Notes (Signed)
IP PROGRESS NOTE  Subjective:   Perry Nielsen is known to me with a recent diagnosis of non-Hodgkin's lymphoma. He was scheduled for an appointment at the [facility] yesterday, but was admitted with confusion. He completed cycle 1 CHOP/rituximab on 03/15/2015. He received Neulasta 03/18/2015. Perry Nielsen reports tolerating the chemotherapy well. He continues to have pain at the right foot.     Objective: Vital signs in last 24 hours: Blood pressure 106/70, pulse 85, temperature 99.5 F (37.5 C), temperature source Oral, resp. rate 18, height 6' (1.829 m), weight 147 lb 11.3 oz (67 kg), SpO2 92 %.  Intake/Output from previous day: 09/19 0701 - 09/20 0700 In: 3111.7 [P.O.:720; I.V.:2391.7] Out: 200 [Urine:200]  Physical Exam:  HEENT: Thrush over the tongue and left buccal mucosa Lungs: Clear bilaterally, slight decrease in breast thousand right chest Cardiac: Regular rate and rhythm Abdomen: Nontender, no hepatosplenomegaly Extremities: No leg edema Neurologic: 3/5 strength with dorsi flexion at the right foot, otherwise intact. Alert, follows commands. Oriented to place and diagnosis. Not oriented to month or year. Skin: Superficial desquamation over the trunk, resolving rash  Lab Results:  Recent Labs  03/25/15 0138 03/26/15 1020  WBC 12.0* 17.9*  HGB 12.1* 11.9*  HCT 35.5* 36.1*  PLT 108* 131*    BMET  Recent Labs  03/25/15 0138 03/26/15 1020  NA 137 138  K 3.4* 3.4*  CL 103 104  CO2 26 26  GLUCOSE 112* 104*  BUN 9 <5*  CREATININE 0.73 0.74  CALCIUM 8.3* 8.1*    Studies/Results: Ct Head Wo Contrast  03/24/2015   CLINICAL DATA:  Confusion since yesterday. Non-Hodgkin's lymphoma. Initial encounter.  EXAM: CT HEAD WITHOUT CONTRAST  TECHNIQUE: Contiguous axial images were obtained from the base of the skull through the vertex without intravenous contrast.  COMPARISON:  None.  FINDINGS: There is no evidence of acute intracranial hemorrhage, mass lesion, brain  edema or extra-axial fluid collection. The ventricles and subarachnoid spaces are mildly prominent, consistent with mild atrophy. There is no CT evidence of acute cortical infarction. There is mild periventricular white matter disease, likely related to chronic small vessel ischemic changes. Intracranial vascular calcifications are noted.  The visualized paranasal sinuses, mastoid air cells and middle ears are clear. The calvarium is intact.  IMPRESSION: No acute intracranial findings. Mild atrophy and periventricular white matter disease.   Electronically Signed   By: Richardean Sale M.D.   On: 03/24/2015 20:33   Perry Nielsen GD Contrast  03/24/2015   CLINICAL DATA:  Confusion beginning yesterday, altered mental status. 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  EXAM: MRI HEAD WITHOUT AND WITH CONTRAST  TECHNIQUE: Multiplanar, multiecho pulse sequences of the brain and surrounding structures were obtained without and with intravenous contrast.  CONTRAST:  35m MULTIHANCE GADOBENATE DIMEGLUMINE 529 MG/ML IV SOLN  COMPARISON:  CT head March 24, 2015 at 2026 hours  FINDINGS: Mildly motion degraded examination.  The ventricles and sulci are normal for patient's age. No suspicious parenchymal signal, mass lesions, mass effect. Scattered subcentimeter supratentorial white matter T2 hyperintensities. 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 No abnormal parenchymal enhancement. RIGHT frontal developmental venous anomaly. No reduced diffusion to suggest acute ischemia. No susceptibility artifact to suggest hemorrhage.  No abnormal extra-axial fluid collections. No extra-axial masses nor leptomeningeal enhancement. Normal major intracranial vascular flow voids seen at the skull base.  Status post bilateral ocular lens implants. No suspicious calvarial bone marrow signal. No abnormal sellar expansion. Craniocervical junction maintained. Visualized  paranasal sinuses and mastoid air cells are  well-aerated.  IMPRESSION: No acute intracranial process.  Involutional changes. Mild chronic small vessel ischemic disease. 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  RIGHT frontal developmental venous anomaly, benign finding.   Electronically Signed   By: Elon Alas M.D.   On: 03/24/2015 23:19   Dg Chest Port 1 View  03/25/2015   CLINICAL DATA:  Lymphoma.  EXAM: PORTABLE CHEST - 1 VIEW  COMPARISON:  03/07/2015.  FINDINGS: Mediastinum hilar structures are normal. Low lung volumes. Heart size stable. No pleural effusion or pneumothorax . No acute bony abnormality .  IMPRESSION: Low lung volumes. No acute cardiopulmonary disease noted. Chest is stable from prior exam.   Electronically Signed   By: Marcello Moores  Register   On: 03/25/2015 07:53   Dg Abd Portable 1v  03/26/2015   CLINICAL DATA:  Ileus  EXAM: PORTABLE ABDOMEN - 1 VIEW  COMPARISON:  None.  FINDINGS: Scattered large and small bowel gas is noted. No obstructive changes are seen. No abnormal mass or abnormal calcifications are noted. No free air is noted.  IMPRESSION: No acute abnormality noted.   Electronically Signed   By: Inez Catalina M.D.   On: 03/26/2015 12:09    Medications: I have reviewed the patient's current medications.  Assessment/Plan:  1. 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 Thrombocytopenia predating chemotherapy 3. T7 compression fracture-likely pathologic 4. Right foot drop-likely  secondary to nerve compression from the right sacral mass 5. Right foot pain-likely secondary to nerve compression from the sacral mass 6. Diffuse erythematous rash, appearance consistent with a "drug "rash-improved 7. History of Low-grade fever likely secondary to atelectasis/postop fever versus tumor fever 8. Hepatitis B core antibody positive 9. Altered mental status   Perry Nielsen was admitted yesterday with confusion. He remains confused today. The etiology of his confusion is unclear. The diagnostic workup has been unrevealing. I cannot relate the confusion to the chemotherapy regimen. He is no longer taking steroids. Narcotics may be contributing, but he has been maintained on narcotic analgesia for the past month without confusion.  Recommendations: 1. Limit CNS acting medications 2. Follow-up as scheduled at the Cancer center 03/29/2015. 3. Physical therapy for the right foot drop     LOS: 1 day   [place]  03/26/2015, 12:32 PM

## 2015-03-26 NOTE — Consult Note (Signed)
   [facility] CM Inpatient Consult   03/26/2015  Perry Nielsen 12/14/1940 [id number]   Patient evaluated for [place] [facility] services. Went to bedside to speak with patient. Wife not at bedside. Asked [name] for permission to contact his wife regarding [facility] services. Call made to [name] at [phone] to explain and discuss [place] [facility]. [name] pleasantly declines [facility] Management services at this time. She states patient follows closely at the [facility] and she does not feel that additional services are needed from [facility]. Appreciative of call. Made her aware that [facility] brochure and contact information left at bedside for her to call in future if needed. Made inpatient RNCM aware.  Marthenia Rolling, MSN-Ed, RN,BSN [facility] Liaison [phone]

## 2015-03-26 NOTE — Telephone Encounter (Signed)
VM message received form pt's wife wanting to let Dr. Benay Spice know that pt has been to [facility] as of 03/24/15. She would like him to come to see pt.

## 2015-03-26 NOTE — Consult Note (Signed)
[place] [facility] Consult   Reason for Consult:  Cognitive deficit, agitation, confusion and delusional Referring Physician:  Dr. Maryland Pink Patient Identification: Perry Nielsen. MRN:  [id number] Principal Diagnosis: Cognitive deficits Diagnosis:   Patient Active Problem List   Diagnosis Date Noted  . Cognitive deficits [R41.89] 03/26/2015  . Thrombocytopenia [D69.6] 03/25/2015  . Decubitus ulcer of buttock, stage 2 [L89.302] 03/25/2015  . Acute encephalopathy [G93.40] 03/24/2015  . 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 Rash [R21] 03/05/2015  . Malnutrition of moderate degree [E44.0] 02/05/2015  . Foot drop [M21.379] 02/03/2015  . 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 Backache [M54.9] 09/18/2009  . 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 KNEE PAIN, LEFT, CHRONIC [M25.569] 08/07/2008  . CAROTID BRUIT, LEFT [R09.89] 08/07/2008  . Allergic rhinitis [J30.9] 08/03/2007  . 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 GERD [K21.9] 02/18/2007  . BPH associated with nocturia [N40.1, R35.1] 02/18/2007    Total Time spent with patient: 1 hour  Subjective:   Perry Nielsen. is a 74 y.o. male patient admitted with confusion, agitation, delusional thoughts  HPI:  Perry Nielsen. is a 74 y.o. male seen, chart reviewed with the psychiatric social service for face-to-face psychiatric consultation and evaluation of recently increased agitation, confusion, and delusional thoughts big brothers TV show.  Patient Is a Poor Historian due to increased confusion and delusional thoughts. Information for this evaluation and also obtain from his wife on the phone. Reportedly patient was found outside the home trying to get into a car and diffusion to calm inside the house saying that is not his a house and getting upset, irritable and agitated which required multiple family members and [place] [facility] to intervene to bring to the [facility]  long hospital. Patient has no previous history of acute psychiatric hospitalization. Patient has been recently diagnosed with lymphoma of sacrum and received first dose of chemotherapy on 03/15/2015. 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 Patient wife another family members found him confused and talking nonsensically. Oncology consultation believes the confusion is not due to chemotherapy may be secondary to narcotic medication. Patient has been taking narcotic medication along with ibuprofen. Patient medically in this case including CT head and the MRI brain revealed negative findings.   Patient repeatedly states that he is confused and not able to learn new information like 13 stripes on the Flag represents 13 original colonies but patient repeatedly says 50 or 9 after giving the answers to him. Patient was not able to focus/concentrate to do 7 series but able to name days of the week from Monday to Sunday and Sunday to Monday without difficulty. Patient is able to follow three-step command and also able to respond appropriately for proverb interpretation. Patient has a limited responses for similarities when asked about describe how orange and apples are aike or different. Patient does not have agitation or aggressive behavior during my evaluation, and also, and cooperative.   Believe ptient still has capacity to make his medical decisions and living arrangements at this time. Patient family history significant for the late age dementia in his father. We will continue to watch for cognitive deficits and make less narcotic during this hospitalization and also if possible we may add mild dose of anti-psychotics or dementia medication to prevent further decline in his cognitive symptoms.  HPI Elements:   Location:  Confusion, agitation and delusional thoughts. Quality:  Fair to poor. Severity:  Acute onset. Timing:  Recent chemotherapy for Lymphoma. Duration:  Few days. Context:  Acute  status secondary to cancer, chemotherapy and medication changes.  Past Medical History:  Past Medical History  Diagnosis Date  . 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 ALLERGIC RHINITIS 08/03/2007  . GERD 02/18/2007  . KNEE PAIN, LEFT, CHRONIC 08/07/2008  . BACK PAIN 09/18/2009  . HAMMER TOE 08/03/2007  . CAROTID BRUIT, LEFT 08/07/2008  . 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 Foot drop     rt foot  . Rash, skin     torso  . Hepatitis 1977    no residual problems  . Sleeping difficulty   . Skin cancer   . Lymphoma     Past Surgical History  Procedure Laterality Date  . Shoulder open rotator cuff repair  07/06/05    right  . Knee arthroscopy      left  . Tonsillectomy and adenoidectomy    . Transurethral resection of prostate    . Cataract extraction w/ intraocular lens  implant, bilateral    . 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 Heart attack Other   . Aneurysm Other     aortic  . Coronary artery disease Sister   . Cancer Father     prostate  . Pulmonary fibrosis Mother   . Uterine cancer Mother    Social History:  History  Alcohol Use  . 0.0 oz/week  . 0 Standard drinks or equivalent per week    Comment: 2-3 times a week. Beer.      History  Drug Use No    Social History   Social History  . Marital Status: Married    Spouse Name: N/A  . Number of Children: N/A  . Years of Education: N/A   Social History Main Topics  . Smoking status: Never Smoker   . Smokeless tobacco: None  . Alcohol Use: 0.0 oz/week    0 Standard drinks or equivalent per week     Comment: 2-3 times a week. Beer.   . Drug Use: No  . Sexual Activity: Not Asked   Other Topics Concern  . None   Social History Narrative   Married.   Very active.     Additional Social History:                          Allergies:   Allergies  Allergen Reactions  . Antihistamines, Diphenhydramine-Type     CAUSES PROSTATE TO SWELL & as a child had hyperactivity when took Benadryl Pt took Pepcid w/ Benadryl PO and did fine per Pt & his wife    Labs:  Results for orders placed or performed during the hospital encounter of 03/24/15 (from the past 48 hour(s))  Urinalysis, Routine w reflex microscopic (not at [facility])     Status: Abnormal   Collection Time: 03/24/15  3:55 PM  Result Value Ref Range   Color, Urine YELLOW YELLOW   APPearance CLOUDY (A) CLEAR   Specific Gravity, Urine 1.012 1.005 - 1.030   pH 6.0 5.0 - 8.0   Glucose, UA NEGATIVE NEGATIVE mg/dL   Hgb urine dipstick NEGATIVE NEGATIVE   Bilirubin Urine NEGATIVE NEGATIVE   Ketones, ur  NEGATIVE NEGATIVE mg/dL   Protein, ur NEGATIVE NEGATIVE mg/dL   Urobilinogen, UA 0.2 0.0 - 1.0 mg/dL   Nitrite NEGATIVE NEGATIVE   Leukocytes, UA NEGATIVE NEGATIVE    Comment: MICROSCOPIC NOT DONE ON URINES WITH NEGATIVE PROTEIN, BLOOD, LEUKOCYTES, NITRITE, OR GLUCOSE <1000 mg/dL.  CBC with Differential/Platelet     Status: Abnormal   Collection Time: 03/24/15  6:22 PM  Result Value Ref Range   WBC 7.6 4.0 - 10.5 K/uL   RBC 3.98 (L) 4.22 - 5.81 MIL/uL   Hemoglobin 11.8 (L) 13.0 - 17.0 g/dL   HCT 36.0 (L) 39.0 - 52.0 %   MCV 90.5 78.0 - 100.0 fL   MCH 29.6 26.0 - 34.0 pg   MCHC 32.8 30.0 - 36.0 g/dL   RDW 14.4 11.5 - 15.5 %   Platelets 107 (L) 150 - 400 K/uL    Comment: RESULT REPEATED AND VERIFIED SPECIMEN CHECKED FOR CLOTS    Neutrophils Relative % 77 %   Lymphocytes Relative 11 %   Monocytes Relative 10 %   Eosinophils Relative 0 %   Basophils Relative 2 %   Neutro Abs 5.8 1.7 - 7.7 K/uL   Lymphs Abs 0.8 0.7 - 4.0 K/uL   Monocytes Absolute 0.8 0.1 - 1.0 K/uL   Eosinophils Absolute 0.0 0.0 - 0.7 K/uL   Basophils Absolute 0.2 (H) 0.0 - 0.1 K/uL   WBC Morphology MILD  LEFT SHIFT (1-5% METAS, OCC MYELO, OCC BANDS)     Comment: DOHLE BODIES   Smear Review LARGE PLATELETS PRESENT     Comment: PLATELET COUNT CONFIRMED BY SMEAR  Comprehensive metabolic panel     Status: Abnormal   Collection Time: 03/24/15  6:22 PM  Result Value Ref Range   Sodium 136 135 - 145 mmol/L   Potassium 3.7 3.5 - 5.1 mmol/L   Chloride 100 (L) 101 - 111 mmol/L   CO2 29 22 - 32 mmol/L   Glucose, Bld 89 65 - 99 mg/dL   BUN 12 6 - 20 mg/dL   Creatinine, Ser 0.89 0.61 - 1.24 mg/dL   Calcium 8.3 (L) 8.9 - 10.3 mg/dL   Total Protein 5.0 (L) 6.5 - 8.1 g/dL   Albumin 2.5 (L) 3.5 - 5.0 g/dL   AST 16 15 - 41 U/L   ALT 27 17 - 63 U/L   Alkaline Phosphatase 56 38 - 126 U/L   Total Bilirubin 0.6 0.3 - 1.2 mg/dL   GFR calc non Af Amer >60 >60 mL/min   GFR calc Af Amer >60 >60 mL/min    Comment: (NOTE) The eGFR has been calculated using the CKD EPI equation. This calculation has not been validated in all clinical situations. eGFR's persistently <60 mL/min signify possible Chronic Kidney Disease.    Anion gap 7 5 - 15  Ammonia     Status: None   Collection Time: 03/25/15  1:38 AM  Result Value Ref Range   Ammonia 20 9 - 35 umol/L  RPR     Status: None   Collection Time: 03/25/15  1:38 AM  Result Value Ref Range   RPR Ser Ql Non Reactive Non Reactive    Comment: (NOTE) Performed At: [facility] [place], [place] [id number] Lindon Romp MD [id number]   Vitamin B12     Status: Abnormal   Collection Time: 03/25/15  1:38 AM  Result Value Ref Range   Vitamin B-12 3517 (H) 180 - 914 pg/mL    Comment: (NOTE)  This assay is not validated for testing neonatal or myeloproliferative syndrome specimens for Vitamin B12 levels. Performed at [facility]   Folate     Status: None   Collection Time: 03/25/15  1:38 AM  Result Value Ref Range   Folate 10.7 >5.9 ng/mL    Comment: Performed at [facility]  Comprehensive metabolic panel      Status: Abnormal   Collection Time: 03/25/15  1:38 AM  Result Value Ref Range   Sodium 137 135 - 145 mmol/L   Potassium 3.4 (L) 3.5 - 5.1 mmol/L   Chloride 103 101 - 111 mmol/L   CO2 26 22 - 32 mmol/L   Glucose, Bld 112 (H) 65 - 99 mg/dL   BUN 9 6 - 20 mg/dL   Creatinine, Ser 0.73 0.61 - 1.24 mg/dL   Calcium 8.3 (L) 8.9 - 10.3 mg/dL   Total Protein 5.2 (L) 6.5 - 8.1 g/dL   Albumin 2.8 (L) 3.5 - 5.0 g/dL   AST 16 15 - 41 U/L   ALT 25 17 - 63 U/L   Alkaline Phosphatase 61 38 - 126 U/L   Total Bilirubin 0.6 0.3 - 1.2 mg/dL   GFR calc non Af Amer >60 >60 mL/min   GFR calc Af Amer >60 >60 mL/min    Comment: (NOTE) The eGFR has been calculated using the CKD EPI equation. This calculation has not been validated in all clinical situations. eGFR's persistently <60 mL/min signify possible Chronic Kidney Disease.    Anion gap 8 5 - 15  CBC WITH DIFFERENTIAL     Status: Abnormal   Collection Time: 03/25/15  1:38 AM  Result Value Ref Range   WBC 12.0 (H) 4.0 - 10.5 K/uL   RBC 4.01 (L) 4.22 - 5.81 MIL/uL   Hemoglobin 12.1 (L) 13.0 - 17.0 g/dL   HCT 35.5 (L) 39.0 - 52.0 %   MCV 88.5 78.0 - 100.0 fL   MCH 30.2 26.0 - 34.0 pg   MCHC 34.1 30.0 - 36.0 g/dL   RDW 14.5 11.5 - 15.5 %   Platelets 108 (L) 150 - 400 K/uL    Comment: REPEATED TO VERIFY CONSISTENT WITH PREVIOUS RESULT LARGE PLATELETS PRESENT    Neutrophils Relative % 82 %   Lymphocytes Relative 6 %   Monocytes Relative 10 %   Eosinophils Relative 0 %   Basophils Relative 2 %   Neutro Abs 9.9 (H) 1.7 - 7.7 K/uL   Lymphs Abs 0.7 0.7 - 4.0 K/uL   Monocytes Absolute 1.2 (H) 0.1 - 1.0 K/uL   Eosinophils Absolute 0.0 0.0 - 0.7 K/uL   Basophils Absolute 0.2 (H) 0.0 - 0.1 K/uL   RBC Morphology POLYCHROMASIA PRESENT     Comment: RARE NRBCs   WBC Morphology MILD LEFT SHIFT (1-5% METAS, OCC MYELO, OCC BANDS)     Comment: TOXIC GRANULATION DOHLE BODIES   TSH     Status: None   Collection Time: 03/25/15  7:01 AM  Result Value Ref  Range   TSH 2.456 0.350 - 4.500 uIU/mL  HIV antibody (routine testing) (NOT for [facility])     Status: None   Collection Time: 03/25/15  7:01 AM  Result Value Ref Range   HIV Screen 4th Generation wRfx Non Reactive Non Reactive    Comment: (NOTE) Performed At: [facility] [address] [place], [place] [id number] Lindon Romp MD [id number]   Urine rapid drug screen (hosp performed)     Status: Abnormal  Collection Time: 03/25/15  8:07 AM  Result Value Ref Range   Opiates POSITIVE (A) NONE DETECTED   Cocaine NONE DETECTED NONE DETECTED   Benzodiazepines NONE DETECTED NONE DETECTED   Amphetamines NONE DETECTED NONE DETECTED   Tetrahydrocannabinol NONE DETECTED NONE DETECTED   Barbiturates NONE DETECTED NONE DETECTED    Comment:        DRUG SCREEN FOR MEDICAL PURPOSES ONLY.  IF CONFIRMATION IS NEEDED FOR ANY PURPOSE, NOTIFY LAB WITHIN 5 DAYS.        LOWEST DETECTABLE LIMITS FOR URINE DRUG SCREEN Drug Class       Cutoff (ng/mL) Amphetamine      1000 Barbiturate      200 Benzodiazepine   790 Tricyclics       240 Opiates          300 Cocaine          300 THC              50   Blood gas, arterial     Status: Abnormal   Collection Time: 03/25/15  3:00 PM  Result Value Ref Range   FIO2 0.21    Delivery systems ROOM AIR    pH, Arterial 7.478 (H) 7.350 - 7.450   pCO2 arterial 33.0 (L) 35.0 - 45.0 mmHg   pO2, Arterial 61.8 (L) 80.0 - 100.0 mmHg   Bicarbonate 24.1 (H) 20.0 - 24.0 mEq/L   TCO2 21.6 0 - 100 mmol/L   Acid-Base Excess 1.4 0.0 - 2.0 mmol/L   O2 Saturation 90.8 %   Patient temperature 98.6    Collection site RIGHT RADIAL    Drawn by [phone]    Sample type ARTERIAL DRAW    Allens test (pass/fail) PASS PASS  Basic metabolic panel     Status: Abnormal   Collection Time: 03/26/15 10:20 AM  Result Value Ref Range   Sodium 138 135 - 145 mmol/L   Potassium 3.4 (L) 3.5 - 5.1 mmol/L   Chloride 104 101 - 111 mmol/L   CO2 26 22 - 32 mmol/L   Glucose, Bld  104 (H) 65 - 99 mg/dL   BUN <5 (L) 6 - 20 mg/dL   Creatinine, Ser 0.74 0.61 - 1.24 mg/dL   Calcium 8.1 (L) 8.9 - 10.3 mg/dL   GFR calc non Af Amer >60 >60 mL/min   GFR calc Af Amer >60 >60 mL/min    Comment: (NOTE) The eGFR has been calculated using the CKD EPI equation. This calculation has not been validated in all clinical situations. eGFR's persistently <60 mL/min signify possible Chronic Kidney Disease.    Anion gap 8 5 - 15  CBC     Status: Abnormal   Collection Time: 03/26/15 10:20 AM  Result Value Ref Range   WBC 17.9 (H) 4.0 - 10.5 K/uL   RBC 4.01 (L) 4.22 - 5.81 MIL/uL   Hemoglobin 11.9 (L) 13.0 - 17.0 g/dL   HCT 36.1 (L) 39.0 - 52.0 %   MCV 90.0 78.0 - 100.0 fL   MCH 29.7 26.0 - 34.0 pg   MCHC 33.0 30.0 - 36.0 g/dL   RDW 15.3 11.5 - 15.5 %   Platelets 131 (L) 150 - 400 K/uL    Vitals: Blood pressure 106/70, pulse 85, temperature 99.5 F (37.5 C), temperature source Oral, resp. rate 18, height 6' (1.829 m), weight 67 kg (147 lb 11.3 oz), SpO2 92 %.  Risk to Self: Is patient at risk for suicide?: No Risk to Others:  Prior Inpatient Therapy:   Prior Outpatient Therapy:    Current Facility-Administered Medications  Medication Dose Route Frequency Provider Last Rate Last Dose  . acetaminophen (TYLENOL) tablet 650 mg  650 mg Oral Q6H PRN Rise Patience, MD       Or  . acetaminophen (TYLENOL) suppository 650 mg  650 mg Rectal Q6H PRN Rise Patience, MD      . docusate sodium (COLACE) capsule 100 mg  100 mg Oral BID Rise Patience, MD   100 mg at 03/26/15 0946  . enoxaparin (LOVENOX) injection 40 mg  40 mg Subcutaneous Q24H Rise Patience, MD   40 mg at 03/26/15 0950  . famotidine (PEPCID) tablet 20 mg  20 mg Oral Daily Rise Patience, MD   20 mg at 03/26/15 0947  . feeding supplement (ENSURE ENLIVE) (ENSURE ENLIVE) liquid 237 mL  237 mL Oral BID BM Rise Patience, MD   237 mL at 03/26/15 0950  . fluticasone (FLONASE) 50 MCG/ACT nasal  spray 1 spray  1 spray Each Nare Daily PRN Rise Patience, MD      . HYDROmorphone (DILAUDID) tablet 4 mg  4 mg Oral Q4H PRN Rise Patience, MD   4 mg at 03/26/15 1308  . loratadine (CLARITIN) tablet 10 mg  10 mg Oral Daily Rise Patience, MD   10 mg at 03/26/15 0949  . ondansetron (ZOFRAN) tablet 4 mg  4 mg Oral Q6H PRN Rise Patience, MD       Or  . ondansetron [facility]) injection 4 mg  4 mg Intravenous Q6H PRN Rise Patience, MD      . traZODone (DESYREL) tablet 150 mg  150 mg Oral QHS Rise Patience, MD   150 mg at 03/25/15 2124    Musculoskeletal: Strength & Muscle Tone: decreased Gait & Station: unable to stand Patient leans: N/A  Psychiatric Specialty Exam: Physical Exam as per history and physical   ROS left foot drop, sacral Mass, , confusion and delusional thoughts but denied nausea, vomiting, shortness of breath and chest. No Fever-chills, No Headache, No changes with Vision or hearing, reports vertigo No problems swallowing food or Liquids, No Chest pain, Cough or Shortness of Breath, No Abdominal pain, No Nausea or Vommitting, Bowel movements are regular, No Blood in stool or Urine, No dysuria, No new skin rashes or bruises, No new joints pains-aches,  No new weakness, tingling, numbness in any extremity, No recent weight gain or loss, No polyuria, polydypsia or polyphagia,   A full 10 point Review of Systems was done, except as stated above, all other Review of Systems were negative.  Blood pressure 106/70, pulse 85, temperature 99.5 F (37.5 C), temperature source Oral, resp. rate 18, height 6' (1.829 m), weight 67 kg (147 lb 11.3 oz), SpO2 92 %.Body mass index is 20.03 kg/(m^2).  General Appearance: Guarded  Eye Contact::  Good  Speech:  Clear and Coherent  Volume:  Decreased  Mood:  Anxious  Affect:  Non-Congruent and Inappropriate  Thought Process:  Irrelevant and Tangential  Orientation:  Full (Time, Place, and Person)   Thought Content:  Delusions and Rumination  Suicidal Thoughts:  No  Homicidal Thoughts:  No  Memory:  Immediate;   Fair Recent;   Poor  Judgement:  Fair  Insight:  Shallow  Psychomotor Activity:  Increased  Concentration:  Poor  Recall:  Poor  Fund of Knowledge:Good  Language: Good  Akathisia:  Negative  Handed:  Right  AIMS (if indicated):     Assets:  Agricultural consultant Housing Intimacy Leisure Time Resilience Social Support Talents/Skills Transportation  ADL's:  Impaired  Cognition: Impaired,  Mild  Sleep:      Medical Decision Making: New problem, with additional work up planned, Review of Psycho-Social Stressors (1), Review or order clinical lab tests (1), Review or order medicine tests (1), Review of Medication Regimen & Side Effects (2) and Review of New Medication or Change in Dosage (2)  Treatment Plan Summary: Daily contact with patient to assess and evaluate symptoms and progress in treatment and Medication management  Plan:  Patient may benefit with the risperidone 0.25 mg twice daily if agitation and confusion continues incident of medication adjustment especially narcotics if possible Patient does not meet criteria for psychiatric inpatient admission. Supportive therapy provided about ongoing stressors.  Appreciate psychiatric consultation and follow up as clinically required Please contact [phone] or [phone] if needs further assistance  Disposition: Patient may benefit from neuropsychological testing for further evaluation of possible dementia after ruling out medication induced confusion/memory loss and consultation.  JONNALAGADDA,JANARDHAHA R. 03/26/2015 1:52 PM

## 2015-03-26 NOTE — Progress Notes (Signed)
PROGRESS NOTE  Perry Nielsen. ZTI:[id number] DOB: 1940/09/20 DOA: 03/24/2015 PCP: Joycelyn Man, MD  HPI/Recap of past 59 hours: 74 year old male with past mental history of large cell lymphoma as well as sacral mass causing right foot drop status post initial chemotherapy on 9/9 brought into the emergency room on the evening of 9/18 by his wife after she noted him having acute agitation and confusion starting 9/17. Patient prior to this has been relatively appropriate only minimal confusion such as forgetting and naming occasionally. However starting Saturday, patient quite agitated, ruminating on sister-in-law and wife living in different house and other unclear untrue episodes. Took several members to calm patient down however the following day the symptoms recurred and patient was brought into the emergency room. CT scan and MRI negative. Lab work otherwise unremarkable for renal failure, electrolyte abnormality. Workup so far is unrevealing including normal thyroid function, B-12 and folate.  Patient doing okay. He is more alert and interactive and seems to be less ruminating, although according to wife he is still somewhat confused, although certainly not as agitated or argumentative. In regards to his lab work, only abnormality has been increasing white blood cell count  Assessment/Plan: Principal Problem:   Acute encephalopathy: Very unclear etiology and looks to be sudden onset. Patient's medications would not account for this. For thoroughness sake we'll check heavy metal screen and ABG. Waiting for psychiatry to see. Patient seems to be having mild improvement? Active Problems:   Foot drop: Secondary to sacral mass.   Lymphoma, large cell: Status post initial chemotherapy started 10 days ago. Oncology does not feel chemotherapy related to confusion   Thrombocytopenia: Stable   Decubitus ulcer of buttock, stage 2: Present on admission, secondary to prolonged bed rest from  associated foot drop. Therapeutic mattress added. Appreciate wound care help. Not an active source of infection Leukocytosis: No signs of infection. Chest x-ray, urinalysis and abdominal x-ray negative.? This is from Neulasta residual. We'll check with oncology.  Blood cultures also drawn. We'll not start antibiotics yet as him not convinced this is a full on infection  Code Status: Full code  Family Communication: Wife at the bedside  Disposition Plan: Potential discharge tomorrow   Consultants:  Oncology  Psychiatry  Procedures:  None  Antibiotics:  None   Objective: BP 111/72 mmHg  Pulse 87  Temp(Src) 98.7 F (37.1 C) (Oral)  Resp 16  Ht 6' (1.829 m)  Wt 67 kg (147 lb 11.3 oz)  BMI 20.03 kg/m2  SpO2 95%  Intake/Output Summary (Last 24 hours) at 03/26/15 1459 Last data filed at 03/26/15 1417  Gross per 24 hour  Intake 2513.33 ml  Output    450 ml  Net 2063.33 ml   Filed Weights   03/24/15 2328  Weight: 67 kg (147 lb 11.3 oz)    Exam:   General:  Oriented 2, appropriately interactive  Cardiovascular: Regular rate and rhythm, S1-S2  Respiratory: Clear to auscultation bilaterally  Abdomen: Soft, nontender, nondistended, positive bowel sounds  Backside: Stage II decubitus ulcer right at crack of buttocks  Musculoskeletal: No clubbing or cyanosis, trace edema-with right foot drop   Data Reviewed: Basic Metabolic Panel:  Recent Labs Lab 03/24/15 1822 03/25/15 0138 03/26/15 1020  NA 136 137 138  K 3.7 3.4* 3.4*  CL 100* 103 104  CO2 29 26 26   GLUCOSE 89 112* 104*  BUN 12 9 <5*  CREATININE 0.89 0.73 0.74  CALCIUM 8.3* 8.3* 8.1*   Liver Function Tests:  Recent Labs Lab 03/24/15 1822 03/25/15 0138  AST 16 16  ALT 27 25  ALKPHOS 56 61  BILITOT 0.6 0.6  PROT 5.0* 5.2*  ALBUMIN 2.5* 2.8*   No results for input(s): LIPASE, AMYLASE in the last 168 hours.  Recent Labs Lab 03/25/15 0138  AMMONIA 20   CBC:  Recent Labs Lab  03/24/15 1822 03/25/15 0138 03/26/15 1020  WBC 7.6 12.0* 17.9*  NEUTROABS 5.8 9.9*  --   HGB 11.8* 12.1* 11.9*  HCT 36.0* 35.5* 36.1*  MCV 90.5 88.5 90.0  PLT 107* 108* 131*   Cardiac Enzymes:   No results for input(s): CKTOTAL, CKMB, CKMBINDEX, TROPONINI in the last 168 hours. BNP (last 3 results) No results for input(s): BNP in the last 8760 hours.  ProBNP (last 3 results) No results for input(s): PROBNP in the last 8760 hours.  CBG: No results for input(s): GLUCAP in the last 168 hours.  No results found for this or any previous visit (from the past 240 hour(s)).   Studies: Dg Abd Portable 1v  03/26/2015   CLINICAL DATA:  Ileus  EXAM: PORTABLE ABDOMEN - 1 VIEW  COMPARISON:  None.  FINDINGS: Scattered large and small bowel gas is noted. No obstructive changes are seen. No abnormal mass or abnormal calcifications are noted. No free air is noted.  IMPRESSION: No acute abnormality noted.   Electronically Signed   By: Inez Catalina M.D.   On: 03/26/2015 12:09    Scheduled Meds: . docusate sodium  100 mg Oral BID  . enoxaparin (LOVENOX) injection  40 mg Subcutaneous Q24H  . famotidine  20 mg Oral Daily  . feeding supplement (ENSURE ENLIVE)  237 mL Oral BID BM  . loratadine  10 mg Oral Daily  . traZODone  150 mg Oral QHS    Continuous Infusions:     Time spent: 15 minutes  [facility] Pager [phone]. If 7PM-7AM, please contact night-coverage at [email or web address], password [facility] 03/26/2015, 2:59 PM  LOS: 1 day

## 2015-03-27 ENCOUNTER — Telehealth: Payer: Self-pay | Admitting: Family Medicine

## 2015-03-27 LAB — HEAVY METALS, BLOOD
Arsenic: 5 ug/L (ref 2–23)
Lead: NOT DETECTED ug/dL (ref 0–19)
Mercury: NOT DETECTED ug/L (ref 0.0–14.9)

## 2015-03-27 LAB — GLUCOSE, CAPILLARY: Glucose-Capillary: 126 mg/dL — ABNORMAL HIGH (ref 65–99)

## 2015-03-27 MED ORDER — HYDROMORPHONE HCL 2 MG PO TABS: 2.0000 mg | ORAL_TABLET | ORAL | Status: DC | PRN

## 2015-03-27 MED ORDER — ENSURE ENLIVE PO LIQD: 237.0000 mL | Freq: Two times a day (BID) | ORAL | Status: DC

## 2015-03-27 MED ORDER — HYDROMORPHONE HCL 2 MG PO TABS: 4.0000 mg | ORAL_TABLET | ORAL | Status: DC | PRN

## 2015-03-27 MED ORDER — TRAZODONE HCL 100 MG PO TABS: 100.0000 mg | ORAL_TABLET | Freq: Every day | ORAL | Status: DC

## 2015-03-27 NOTE — Telephone Encounter (Signed)
Thank you :)

## 2015-03-27 NOTE — Progress Notes (Signed)
Spoke with pt's wife concerning HH. She selected [facility] for [place]. Referral given to in house rep.

## 2015-03-27 NOTE — Evaluation (Signed)
Physical Therapy One Time Evaluation Patient Details Name: Perry Nielsen. MRN: [id number] DOB: 1941-03-12 Today's Date: 03/27/2015   History of Present Illness  74 year old male with past medical history of large cell lymphoma as well as sacral mass causing right foot drop status post initial chemotherapy on 9/9 admitted 9/18 by his wife after she noted him having acute agitation and confusion.  Clinical Impression  Patient evaluated by Physical Therapy with no further acute PT needs identified. All education has been completed and the patient has no further questions.  Pt reports R drop foot since July.  Pt reports being very cautious with mobility at home and denies any falls.  Pt as been using PRAFO from previous hospitalization for mobility so educated that [facility] was for use in bed/rest and not with mobility.  [name] spouse reports HHPT ordering "CAM something" for foot drop however educated pt and spouse that for foot drop, pt would most benefit from AFO.   Pt also reporting fatigue and limited endurance since starting chemotherapy.  See below for any follow-up Physical Therapy or equipment needs. PT is signing off. Thank you for this referral.     Follow Up Recommendations Home health PT    Equipment Recommendations  Other (comment) (AFO for foot drop)    Recommendations for Other Services       Precautions / Restrictions Precautions Precautions: Fall Precaution Comments: R foot drop Restrictions Weight Bearing Restrictions: No      Mobility  Bed Mobility Overal bed mobility: Modified Independent                Transfers Overall transfer level: Needs assistance Equipment used: Standard walker Transfers: Sit to/from Stand Sit to Stand: Min guard            Ambulation/Gait Ambulation/Gait assistance: Min guard Ambulation Distance (Feet): 25 Feet Assistive device: Standard walker Gait Pattern/deviations: Step-to pattern;Decreased dorsiflexion - right      General Gait Details: educated on step to sequence due to foot drop and pt fearful of falling and rolling ankle, compensatory gait pattern of increased hip and knee flexion due to foot drop  Stairs            Wheelchair Mobility    Modified Rankin (Stroke Patients Only)       Balance Overall balance assessment:  (steady with walker, denies any falls)                                           Pertinent Vitals/Pain Pain Assessment: No/denies pain    Home Living Family/patient expects to be discharged to:: Private residence Living Arrangements: Spouse/significant other Available Help at Discharge: Family Type of Home: House Home Access: Stairs to enter   Technical brewer of Steps: 2 Home Layout: Two level;Bed/bath upstairs Home Equipment: Walker - standard;Cane - single point;Shower seat;Grab bars - tub/shower      Prior Function Level of Independence: Independent with assistive device(s)               Hand Dominance        Extremity/Trunk Assessment               Lower Extremity Assessment: Generalized weakness;RLE deficits/detail RLE Deficits / Details: 1+/5 dorsiflexion, presents with functional foot drop       Communication   Communication: No difficulties  Cognition Arousal/Alertness: Awake/alert Behavior During Therapy: WFL for  tasks assessed/performed Overall Cognitive Status: Within Functional Limits for tasks assessed                      General Comments      Exercises        Assessment/Plan    PT Assessment All further PT needs can be met in the next venue of care  PT Diagnosis Abnormality of gait   PT Problem List Decreased strength;Decreased activity tolerance;Decreased mobility;Decreased knowledge of use of DME  PT Treatment Interventions     PT Goals (Current goals can be found in the Care Plan section) Acute Rehab PT Goals PT Goal Formulation: All assessment and education complete, DC  therapy    Frequency     Barriers to discharge        Co-evaluation               End of Session Equipment Utilized During Treatment: Gait belt Activity Tolerance: Patient limited by fatigue Patient left: in bed;with call bell/phone within reach Nurse Communication: Mobility status (NT aware pt getting bathed from spouse EOB)         Time: 1282-0813 PT Time Calculation (min) (ACUTE ONLY): 21 min   Charges:   PT Evaluation $Initial PT Evaluation Tier I: 1 Procedure     PT G Codes:        LEMYRE,KATHrine E 03/27/2015, 12:06 PM Carmelia Bake, PT, DPT 03/27/2015 Pager: [phone]

## 2015-03-27 NOTE — Progress Notes (Signed)
Went over AVS summary with pt and wife.  All questions answered.  Prescriptions given.  Ortho tech fitted patient with boot before leaving.  Walked with patient in the hall, pt stated that it felt comfortable, tolerated well.  Pt wheeled out by NT.

## 2015-03-27 NOTE — Care Management Important Message (Signed)
Important Message  Patient Details  Name: Perry Nielsen. MRN: [id number] Date of Birth: 04/16/1941   Medicare Important Message Given:  [facility] notification given    Camillo Flaming 03/27/2015, 11:44 AMImportant Message  Patient Details  Name: Perry Nielsen. MRN: [id number] Date of Birth: Aug 23, 1940   Medicare Important Message Given:  Yes-second notification given    Camillo Flaming 03/27/2015, 11:44 AM

## 2015-03-27 NOTE — Discharge Summary (Addendum)
Discharge Summary  Perry Nielsen. [id number]:[id number] DOB: [birth year]  PCP: Joycelyn Man, MD  Admit date: 03/24/2015 Discharge date: 03/27/2015  Time spent: 25 minutes  Recommendations for Outpatient Follow-up:  1. Medication change: Trazodone decreased to 100 mg by mouth daily at bedtime 2. Medication change: Dilaudid decreased from 4 mg to 2 mg every 4 hours when necessary for pain 3. Patient will follow up with PCP, Dr. Sherren Mocha, in the next 2-4 weeks.   Discharge Diagnoses:  Active Hospital Problems   Diagnosis Date Noted  . Cognitive deficits 03/26/2015  . Thrombocytopenia 03/25/2015  . Decubitus ulcer of buttock, stage 2 03/25/2015  . Acute encephalopathy 03/24/2015  . Lymphoma, large cell 03/07/2015  . Foot drop 02/03/2015  Moderate protein calorie malnutrition   Resolved Hospital Problems   Diagnosis Date Noted Date Resolved  No resolved problems to display.    Discharge Condition: Improved, being discharged home  Diet recommendation: Regular diet with ensure enlive between meals  Filed Weights   03/24/15 2328  Weight: 67 kg (147 lb 11.3 oz)    History of present illness:  74 year old male with past mental history of large cell lymphoma as well as sacral mass causing right foot drop status post initial chemotherapy on 9/9 brought into the emergency room on the evening of 9/18 by his wife after she noted him having acute agitation and confusion starting 9/17. Patient prior to this has been relatively appropriate only minimal confusion such as forgetting and naming occasionally. However starting Saturday, patient quite agitated, ruminating on sister-in-law and wife living in different house and other unclear untrue episodes. Took several members to calm patient down however the following day the symptoms recurred and patient was brought into the emergency room. CT scan and MRI negative. Lab work otherwise unremarkable for renal failure, electrolyte  abnormality.  Hospital Course:  Principal Problem:   Foot drop: Secondary to sacral mass, stable, will be followed by physical therapy    Acute encephalopathy: Still unclear etiology. Workup unrevealing. Thyroid normal. No signs of severe hypoxia. Oncology evaluated patient and felt not related to chemotherapy. EEG done which was unrevealing. Possibly related to accumulation of narcotic medications or trazodone. Have advised upon discharge to decrease when necessary Dilaudid to half dose, 2 mg. Also recommended decreasing trazodone from 150-100 mg. Of note, both these medications have been continued for some time prior to onset of events. Patient also seen by psychiatry who diagnosed him with cognitive deficits. Given overall improvement, they felt no criteria met for psychiatric inpatient admission.  Patient in the 2 days since he has been admitted, is more insightful and stable. He will follow-up with his PCP and if he is still becoming agitated or more confused, psychiatry and hospitalist recommending outpatient neuropsychological testing for further evaluation of possible dementia and if medication needed for control of agitation, Risperdal 0.25 by mouth twice a day    Thrombocytopenia: Chronic  Leukocytosis: During workup, it was noted that patient's white blood cell count was trending upwards. No fever. Urinalysis, abdominal x-ray and chest x-ray on negative. In discussion with oncology, this was felt to be secondary to Neulasta.    Decubitus ulcer of buttock, stage 2: Present on admission. Secondary to prolonged bed rest from associated foot drop. Seen by wound care. Therapeutic mattress added and pressure redistribution chair pad upon discharge.  Not felt to be an active source of infection.   moderate protein calorie malnutrition: Patient meets criteria in the context of chronic illness. Seen by nutrition.  Started on ensure twice a day between meals.  Procedures:  EEG done 9/19:  Unrevealing  Consultations:  Neurology  Psychiatry  Oncology  Discharge Exam: BP 120/78 mmHg  Pulse 84  Temp(Src) 98.3 F (36.8 C) (Oral)  Resp 18  Ht 6' (1.829 m)  Wt 67 kg (147 lb 11.3 oz)  BMI 20.03 kg/m2  SpO2 94%  General: Alert and oriented 2, no acute distress Cardiovascular: Regular rate and rhythm, S1-S2 Respiratory: Clear to auscultation bilaterally  Discharge Instructions You were cared for by a hospitalist during your hospital stay. If you have any questions about your discharge medications or the care you received while you were in the hospital after you are discharged, you can call the unit and asked to speak with the hospitalist on call if the hospitalist that took care of you is not available. Once you are discharged, your primary care physician will handle any further medical issues. Please note that NO REFILLS for any discharge medications will be authorized once you are discharged, as it is imperative that you return to your primary care physician (or establish a relationship with a primary care physician if you do not have one) for your aftercare needs so that they can reassess your need for medications and monitor your lab values.  Discharge Instructions    Diet - low sodium heart healthy    Complete by:  As directed      Increase activity slowly    Complete by:  As directed             Medication List    STOP taking these medications        allopurinol 300 MG tablet  Commonly known as:  ZYLOPRIM     dexamethasone 2 MG tablet  Commonly known as:  DECADRON     OxyCODONE 10 mg T12a 12 hr tablet  Commonly known as:  OXYCONTIN     pegfilgrastim 6 MG/0.6ML injection  Commonly known as:  NEULASTA     potassium chloride 10 MEQ tablet  Commonly known as:  K-DUR     predniSONE 20 MG tablet  Commonly known as:  DELTASONE      TAKE these medications        famotidine 20 MG tablet  Commonly known as:  PEPCID  Take 20 mg by mouth daily.      feeding supplement (ENSURE ENLIVE) Liqd  Take 237 mLs by mouth 2 (two) times daily between meals.     fluticasone 50 MCG/ACT nasal spray  Commonly known as:  FLONASE  Place 1 spray into both nostrils daily as needed for allergies.     HYDROmorphone 2 MG tablet  Commonly known as:  DILAUDID  Take 1 tablet (2 mg total) by mouth every 4 (four) hours as needed for severe pain.     ibuprofen 200 MG tablet  Commonly known as:  ADVIL,MOTRIN  Take 400 mg by mouth every 4 (four) hours as needed for fever, headache, mild pain, moderate pain or cramping.     loratadine 10 MG tablet  Commonly known as:  CLARITIN  Take 10 mg by mouth daily.     oxymetazoline 0.05 % nasal spray  Commonly known as:  AFRIN  Place 1 spray into both nostrils 2 (two) times daily as needed for congestion.     PRESCRIPTION MEDICATION  Chemo - [facility]     prochlorperazine 5 MG tablet  Commonly known as:  COMPAZINE  Take 1-2 tablets (5-10 mg total) by  mouth every 6 (six) hours as needed for nausea or vomiting.     sorbitol 70 % solution  Take 30 mLs by mouth 2 (two) times daily.     traZODone 100 MG tablet  Commonly known as:  DESYREL  Take 1 tablet (100 mg total) by mouth at bedtime.        Allergies  Allergen Reactions  . Antihistamines, Diphenhydramine-Type     CAUSES PROSTATE TO SWELL & as a child had hyperactivity when took Benadryl Pt took Pepcid w/ Benadryl PO and did fine per Pt & his wife       Follow-up Information    Follow up with TODD,JEFFREY Zenia Resides, MD.   Specialty:  Family Medicine   Contact information:   [place] [address] [phone]        The results of significant diagnostics from this hospitalization (including imaging, microbiology, ancillary and laboratory) are listed below for reference.    Significant Diagnostic Studies: Dg Chest 2 View  03/07/2015   CLINICAL DATA:  Postoperative fever.  EXAM: CHEST  2 VIEW  COMPARISON:  02/03/2015 chest  radiograph  FINDINGS: Low lung volumes. Stable cardiomediastinal silhouette with top-normal heart size and mildly tortuous thoracic aorta. No pneumothorax. No pleural effusion. Mild curvilinear opacities at both lung bases, most in keeping with atelectasis. No focal lung consolidation to suggest a pneumonia. Mild degenerative changes in the thoracic spine.  IMPRESSION: Low lung volumes with bibasilar atelectasis. No focal lung consolidation to suggest a pneumonia.   Electronically Signed   By: Ilona Sorrel M.D.   On: 03/07/2015 10:07   Ct Head Wo Contrast  03/24/2015   CLINICAL DATA:  Confusion since yesterday. Non-Hodgkin's lymphoma. Initial encounter.  EXAM: CT HEAD WITHOUT CONTRAST  TECHNIQUE: Contiguous axial images were obtained from the base of the skull through the vertex without intravenous contrast.  COMPARISON:  None.  FINDINGS: There is no evidence of acute intracranial hemorrhage, mass lesion, brain edema or extra-axial fluid collection. The ventricles and subarachnoid spaces are mildly prominent, consistent with mild atrophy. There is no CT evidence of acute cortical infarction. There is mild periventricular white matter disease, likely related to chronic small vessel ischemic changes. Intracranial vascular calcifications are noted.  The visualized paranasal sinuses, mastoid air cells and middle ears are clear. The calvarium is intact.  IMPRESSION: No acute intracranial findings. Mild atrophy and periventricular white matter disease.   Electronically Signed   By: Richardean Sale M.D.   On: 03/24/2015 20:33   Mr Jeri Cos PI Contrast  03/24/2015   CLINICAL DATA:  Confusion beginning yesterday, altered mental status. 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  EXAM: MRI HEAD WITHOUT AND WITH CONTRAST  TECHNIQUE: Multiplanar, multiecho pulse sequences of the brain and surrounding structures were obtained without and with intravenous contrast.  CONTRAST:  27m MULTIHANCE  GADOBENATE DIMEGLUMINE 529 MG/ML IV SOLN  COMPARISON:  CT head March 24, 2015 at 2026 hours  FINDINGS: Mildly motion degraded examination.  The ventricles and sulci are normal for patient's age. No suspicious parenchymal signal, mass lesions, mass effect. Scattered subcentimeter supratentorial white matter T2 hyperintensities. 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 No abnormal parenchymal enhancement. RIGHT frontal developmental venous anomaly. No reduced diffusion to suggest acute ischemia. No susceptibility artifact to suggest hemorrhage.  No abnormal extra-axial fluid collections. No extra-axial masses nor leptomeningeal enhancement. Normal major intracranial vascular flow voids seen at the skull base.  Status post bilateral ocular lens implants. No suspicious  calvarial bone marrow signal. No abnormal sellar expansion. Craniocervical junction maintained. Visualized paranasal sinuses and mastoid air cells are well-aerated.  IMPRESSION: No acute intracranial process.  Involutional changes. Mild chronic small vessel ischemic disease. 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  RIGHT frontal developmental venous anomaly, benign finding.   Electronically Signed   By: Elon Alas M.D.   On: 03/24/2015 23:19   Dg Chest Port 1 View  03/25/2015   CLINICAL DATA:  Lymphoma.  EXAM: PORTABLE CHEST - 1 VIEW  COMPARISON:  03/07/2015.  FINDINGS: Mediastinum hilar structures are normal. Low lung volumes. Heart size stable. No pleural effusion or pneumothorax . No acute bony abnormality .  IMPRESSION: Low lung volumes. No acute cardiopulmonary disease noted. Chest is stable from prior exam.   Electronically Signed   By: Marcello Moores  Register   On: 03/25/2015 07:53   Dg Abd Portable 1v  03/26/2015   CLINICAL DATA:  Ileus  EXAM: PORTABLE ABDOMEN - 1 VIEW  COMPARISON:  None.  FINDINGS: Scattered large and small bowel gas is noted. No obstructive changes are seen. No abnormal mass or abnormal calcifications are noted. No free air is  noted.  IMPRESSION: No acute abnormality noted.   Electronically Signed   By: Inez Catalina M.D.   On: 03/26/2015 12:09    Microbiology: No results found for this or any previous visit (from the past 240 hour(s)).   Labs: Basic Metabolic Panel:  Recent Labs Lab 03/24/15 1822 03/25/15 0138 03/26/15 1020  NA 136 137 138  K 3.7 3.4* 3.4*  CL 100* 103 104  CO2 _0 GLUCOSE 89 112* 104*  BUN 12 9 <5*  CREATININE 0.89 0.73 0.74  CALCIUM 8.3* 8.3* 8.1*   Liver Function Tests:  Recent Labs Lab 03/24/15 1822 03/25/15 0138  AST 16 16  ALT 27 25  ALKPHOS 56 61  BILITOT 0.6 0.6  PROT 5.0* 5.2*  ALBUMIN 2.5* 2.8*   No results for input(s): LIPASE, AMYLASE in the last 168 hours.  Recent Labs Lab 03/25/15 0138  AMMONIA 20   CBC:  Recent Labs Lab 03/24/15 1822 03/25/15 0138 03/26/15 1020  WBC 7.6 12.0* 17.9*  NEUTROABS 5.8 9.9*  --   HGB 11.8* 12.1* 11.9*  HCT 36.0* 35.5* 36.1*  MCV 90.5 88.5 90.0  PLT 107* 108* 131*      Signed:  KRISHNAN,SENDIL K  [facility] 03/27/2015, 10:10 AM

## 2015-03-27 NOTE — Telephone Encounter (Signed)
Perry Nielsen I used a SDA to make a 30 minute hosp fup for this pt appt is 04/16/15/ at 11am hope this was ok

## 2015-03-29 ENCOUNTER — Ambulatory Visit (HOSPITAL_BASED_OUTPATIENT_CLINIC_OR_DEPARTMENT_OTHER): Payer: Medicare Other | Admitting: Oncology

## 2015-03-29 ENCOUNTER — Other Ambulatory Visit: Payer: Self-pay | Admitting: *Deleted

## 2015-03-29 ENCOUNTER — Telehealth: Payer: Self-pay | Admitting: *Deleted

## 2015-03-29 ENCOUNTER — Other Ambulatory Visit (HOSPITAL_BASED_OUTPATIENT_CLINIC_OR_DEPARTMENT_OTHER): Payer: Medicare Other

## 2015-03-29 VITALS — BP 96/61 | HR 105 | Temp 98.2°F | Resp 16 | Ht 72.0 in | Wt 141.7 lb

## 2015-03-29 DIAGNOSIS — C8510 Unspecified B-cell lymphoma, unspecified site: Secondary | ICD-10-CM

## 2015-03-29 DIAGNOSIS — D696 Thrombocytopenia, unspecified: Secondary | ICD-10-CM | POA: Diagnosis not present

## 2015-03-29 DIAGNOSIS — C8580 Other specified types of non-Hodgkin lymphoma, unspecified site: Secondary | ICD-10-CM

## 2015-03-29 LAB — CBC WITH DIFFERENTIAL/PLATELET
BASO%: 1 % (ref 0.0–2.0)
Basophils Absolute: 0.1 10*3/uL (ref 0.0–0.1)
EOS ABS: 0 10*3/uL (ref 0.0–0.5)
EOS%: 0 % (ref 0.0–7.0)
HCT: 40.2 % (ref 38.4–49.9)
HGB: 12.9 g/dL — ABNORMAL LOW (ref 13.0–17.1)
LYMPH%: 4.8 % — AB (ref 14.0–49.0)
MCH: 29.1 pg (ref 27.2–33.4)
MCHC: 32.1 g/dL (ref 32.0–36.0)
MCV: 90.5 fL (ref 79.3–98.0)
MONO#: 1.1 10*3/uL — ABNORMAL HIGH (ref 0.1–0.9)
MONO%: 8.8 % (ref 0.0–14.0)
NEUT%: 85.4 % — AB (ref 39.0–75.0)
NEUTROS ABS: 10.7 10*3/uL — AB (ref 1.5–6.5)
PLATELETS: 194 10*3/uL (ref 140–400)
RBC: 4.44 10*6/uL (ref 4.20–5.82)
RDW: 16.3 % — ABNORMAL HIGH (ref 11.0–14.6)
WBC: 12.5 10*3/uL — AB (ref 4.0–10.3)
lymph#: 0.6 10*3/uL — ABNORMAL LOW (ref 0.9–3.3)

## 2015-03-29 LAB — COMPREHENSIVE METABOLIC PANEL (CC13)
ALT: 18 U/L (ref 0–55)
ANION GAP: 10 meq/L (ref 3–11)
AST: 15 U/L (ref 5–34)
Albumin: 2.7 g/dL — ABNORMAL LOW (ref 3.5–5.0)
Alkaline Phosphatase: 90 U/L (ref 40–150)
BILIRUBIN TOTAL: 0.38 mg/dL (ref 0.20–1.20)
BUN: 10.9 mg/dL (ref 7.0–26.0)
CHLORIDE: 106 meq/L (ref 98–109)
CO2: 25 meq/L (ref 22–29)
Calcium: 8.3 mg/dL — ABNORMAL LOW (ref 8.4–10.4)
Creatinine: 0.9 mg/dL (ref 0.7–1.3)
EGFR: 81 mL/min/{1.73_m2} — AB (ref >90)
GLUCOSE: 104 mg/dL (ref 70–140)
Potassium: 3.2 mEq/L — ABNORMAL LOW (ref 3.5–5.1)
SODIUM: 140 meq/L (ref 136–145)
TOTAL PROTEIN: 5.6 g/dL — AB (ref 6.4–8.3)

## 2015-03-29 MED ORDER — POTASSIUM CHLORIDE CRYS ER 20 MEQ PO TBCR: 20.0000 meq | EXTENDED_RELEASE_TABLET | Freq: Two times a day (BID) | ORAL | Status: DC

## 2015-03-29 NOTE — Telephone Encounter (Signed)
Per Dr. Benay Spice; notified pt's wife MD wants him to start potassium 20 meq daily because his potassium is low; prescription has been since to Rx.  [name] wife verbalized understanding and expressed appreciation for call.

## 2015-03-29 NOTE — Telephone Encounter (Signed)
Per Dr. Benay Spice; notified pt's wife that MD wants him to start potassium daily and prescription has been sent to Rx.  [name] wife verbalized understanding and states "I'll pick up today"

## 2015-03-29 NOTE — Progress Notes (Signed)
[facility] [place] OFFICE PROGRESS NOTE   Diagnosis: Non-Hodgkin's lymphoma  INTERVAL HISTORY:   Mr. Zulauf returns as scheduled. He completed a first cycle of CHOP-rituximab on 03/15/2015. He was admitted 03/24/2015 with confusion. He underwent a a negative neurologic evaluation including an MRI of the brain and EEG.  His wife reports Mr. Laurel has become less confused since discharge from the hospital, though his metal status has not returned to baseline. He developed diarrhea at discharge from the hospital and this persisted until this morning. He took Imodium with relief. The right foot pain is improved. He is no longer taking pain medication. He reports soreness related to the right foot brace with edema of the right lower leg and foot.  Objective:  Vital signs in last 24 hours:  Blood pressure 96/61, pulse 105, temperature 98.2 F (36.8 C), temperature source Oral, resp. rate 16, height 6' (1.829 m), weight 141 lb 11.2 oz (64.275 kg), SpO2 99 %.    HEENT: No thrush or ulcers Resp: Lungs clear bilaterally Cardio: Regular rate and rhythm GI: Nontender, soft Vascular: Pitting edema at the right lower leg and foot, no erythema Neuro: 3/5 strength with dorsi flexion at the right foot  Skin: Dry desquamation over the trunk, mild erythema and induration surrounding IV site at the left forearm Neurologic: Alert oriented to place, diagnosis, and year, not month     Lab Results:  Lab Results  Component Value Date   WBC 12.5* 03/29/2015   HGB 12.9* 03/29/2015   HCT 40.2 03/29/2015   MCV 90.5 03/29/2015   PLT 194 03/29/2015   NEUTROABS 10.7* 03/29/2015   Potassium 3.2, creatinine 0.9   Imaging:  Dg Abd Portable 1v  03/26/2015   CLINICAL DATA:  Ileus  EXAM: PORTABLE ABDOMEN - 1 VIEW  COMPARISON:  None.  FINDINGS: Scattered large and small bowel gas is noted. No obstructive changes are seen. No abnormal mass or abnormal calcifications are noted. No free air is  noted.  IMPRESSION: No acute abnormality noted.   Electronically Signed   By: Inez Catalina M.D.   On: 03/26/2015 12:09    Medications: I have reviewed the patient's current medications.  Assessment/Plan: 1. 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 Thrombocytopenia predating chemotherapy 3. T7 compression fracture-likely pathologic 4. 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 Right foot pain-likely secondary to nerve compression from the sacral mass, improved 6. Diffuse erythematous rash, appearance consistent with a "drug "rash-improved 7. History of Low-grade fever likely secondary to atelectasis/postop fever versus tumor fever 8. Hepatitis B core antibody positive 9. Altered mental status-likely secondary to polypharmacy-improved    Disposition:  Mr. Grassia is now at day 15 following a first cycle of CHOP-rituximab. He was admitted 03/24/2015 with confusion. This has improved, but his metal status has not returned to baseline. I suspect the confusion was related to polypharmacy (Dilaudid), he may have underlying early dementia. I recommended he stay off of Dilaudid. He will elevate the right leg when not ambulating. He will contact us for erythema or pain  of the right lower leg. I have a low clinical suspicion for a deep vein thrombosis today.  Mr. Mcnee will return for an office visit and cycle 2  CHOP-rituximab on 04/05/2015. He will begin a potassium supplement. He will contact us for recurrent diarrhea.  His appetite has improved since completing chemotherapy. I recommended he push his diet in an attempt to gain weight.  Betsy Coder, MD  03/29/2015  2:48 PM

## 2015-03-31 ENCOUNTER — Other Ambulatory Visit: Payer: Self-pay | Admitting: Oncology

## 2015-03-31 LAB — CULTURE, BLOOD (ROUTINE X 2)
CULTURE: NO GROWTH
Culture: NO GROWTH

## 2015-04-01 ENCOUNTER — Ambulatory Visit
Admission: RE | Admit: 2015-04-01 | Discharge: 2015-04-01 | Disposition: A | Discharge status: Home / Self Care | Payer: Medicare Other | Source: Ambulatory Visit | Attending: Radiation Oncology | Admitting: Radiation Oncology

## 2015-04-01 ENCOUNTER — Encounter: Payer: Self-pay | Admitting: Radiation Oncology

## 2015-04-01 VITALS — BP 81/67 | HR 111 | Temp 97.6°F | Resp 20 | Ht 72.0 in | Wt 142.3 lb

## 2015-04-01 DIAGNOSIS — C7951 Secondary malignant neoplasm of bone: Secondary | ICD-10-CM

## 2015-04-01 NOTE — Progress Notes (Addendum)
Follow up s/p rad tx sacrum 02/07/15-02/20/15,  Recent hospitalization, confusion, , pain mostly in his right foot, 6/10, has wounds/ulcer obn his bottom, a month at least, appetite fair to poor, fatigued, weak, no nausea, ensure makes him nauseated stopped, will try boost Low B/p's BP 89/67 mmHg  Pulse 108  Temp(Src) 97.6 F (36.4 C) (Oral)  Resp 20  Ht 6' (1.829 m)  Wt 142 lb 4.8 oz (64.547 kg)  BMI 19.30 kg/m2  SpO2 92% Right arm Left arm: BP 81/67 mmHg  Pulse 111  Temp(Src) 97.6 F (36.4 C) (Oral)  Resp 20  Ht 6' (1.829 m)  Wt 142 lb 4.8 oz (64.547 kg)  BMI 19.30 kg/m2  SpO2 92%  Wt Readings from Last 3 Encounters:  04/01/15 142 lb 4.8 oz (64.547 kg)  03/29/15 141 lb 11.2 oz (64.275 kg)  03/24/15 147 lb 11.3 oz (67 kg)

## 2015-04-01 NOTE — Progress Notes (Signed)
Radiation Oncology         [id number]) [phone] ________________________________  Name: Perry Nielsen. 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 Follow up s/p rad tx sacrum 02/07/15-02/20/15,recent hospitalization last week for confusion, pain mostly in his right foot, 6/10, has wounds/ulcer on his bottom, a month at least, appetite fair to poor, fatigued, weak, no nausea, ensure makes him nauseated stopped, will try boost. Low b/p.      Pain in right foot located slightly above and below ankle.                       ALLERGIES:  is allergic to antihistamines, diphenhydramine-type.  Meds: Current Outpatient Prescriptions  Medication Sig Dispense Refill  . ibuprofen (ADVIL,MOTRIN) 200 MG tablet Take 400 mg by mouth every 4 (four) hours as needed for fever, headache, mild pain, moderate pain or cramping.    . loratadine (CLARITIN) 10 MG tablet Take 10 mg by mouth daily.    Marland Kitchen oxymetazoline (AFRIN) 0.05 % nasal spray Place 1 spray into both nostrils 2 (two) times daily as needed for congestion.    . potassium chloride SA (K-DUR,KLOR-CON) 20 MEQ tablet Take 1 tablet (20 mEq total) by mouth 2 (two) times daily. 30 tablet 1  . PRESCRIPTION MEDICATION Chemo - [facility]    . traZODone (DESYREL) 100 MG tablet Take 1 tablet (100 mg total) by mouth at bedtime. 30 tablet 1  . famotidine (PEPCID) 20 MG tablet Take 20 mg by mouth daily.     . feeding supplement, ENSURE ENLIVE, (ENSURE ENLIVE) LIQD Take 237 mLs by mouth 2 (two) times daily between meals. (Patient not taking: Reported on 04/01/2015) 237 mL 12  . fluticasone (FLONASE) 50 MCG/ACT nasal spray Place 1 spray into both nostrils daily as needed for allergies.     Marland Kitchen  HYDROmorphone (DILAUDID) 2 MG tablet Take 1 tablet (2 mg total) by mouth every 4 (four) hours as needed for severe pain. (Patient not taking: Reported on 04/01/2015) 30 tablet   . prochlorperazine (COMPAZINE) 5 MG tablet Take 1-2 tablets (5-10 mg total) by mouth every 6 (six) hours as needed for nausea or vomiting. (Patient not taking: Reported on 04/01/2015) 30 tablet 1  . sorbitol 70 % solution Take 30 mLs by mouth 2 (two) times daily. (Patient not taking: Reported on 04/01/2015) 473 mL 2   No current facility-administered medications for this encounter.    Physical Findings: The patient is in no acute distress. Patient is alert and oriented.  height is 6' (1.829 m) and weight is 142 lb 4.8 oz (64.547 kg). His oral temperature is 97.6 F (36.4 C). His blood pressure is 81/67 and his pulse is 111. His respiration is 20 and oxygen saturation is 92%. .   General: Well-developed, in no acute distress HEENT: Normocephalic, atraumatic Cardiovascular: Regular rate and rhythm Respiratory: Clear to auscultation bilaterally GI: Soft, nontender, normal bowel sounds Extremities: right lower calf area and foot swollen Skin: bruising on left arm  Lab Findings: Lab Results  Component Value Date   WBC 12.5* 03/29/2015   HGB 12.9* 03/29/2015   HCT 40.2 03/29/2015   MCV 90.5 03/29/2015   PLT 194 03/29/2015  Radiographic Findings: Dg Chest 2 View  03/07/2015   CLINICAL DATA:  Postoperative fever.  EXAM: CHEST  2 VIEW  COMPARISON:  02/03/2015 chest radiograph  FINDINGS: Low lung volumes. Stable cardiomediastinal silhouette with top-normal heart size and mildly tortuous thoracic aorta. No pneumothorax. No pleural effusion. Mild curvilinear opacities at both lung bases, most in keeping with atelectasis. No focal lung consolidation to suggest a pneumonia. Mild degenerative changes in the thoracic spine.  IMPRESSION: Low lung volumes with bibasilar atelectasis. No focal lung consolidation to suggest a  pneumonia.   Electronically Signed   By: Ilona Sorrel M.D.   On: 03/07/2015 10:07   Ct Head Wo Contrast  03/24/2015   CLINICAL DATA:  Confusion since yesterday. Non-Hodgkin's lymphoma. Initial encounter.  EXAM: CT HEAD WITHOUT CONTRAST  TECHNIQUE: Contiguous axial images were obtained from the base of the skull through the vertex without intravenous contrast.  COMPARISON:  None.  FINDINGS: There is no evidence of acute intracranial hemorrhage, mass lesion, brain edema or extra-axial fluid collection. The ventricles and subarachnoid spaces are mildly prominent, consistent with mild atrophy. There is no CT evidence of acute cortical infarction. There is mild periventricular white matter disease, likely related to chronic small vessel ischemic changes. Intracranial vascular calcifications are noted.  The visualized paranasal sinuses, mastoid air cells and middle ears are clear. The calvarium is intact.  IMPRESSION: No acute intracranial findings. Mild atrophy and periventricular white matter disease.   Electronically Signed   By: Richardean Sale M.D.   On: 03/24/2015 20:33   Mr Jeri Cos PJ Contrast  03/24/2015   CLINICAL DATA:  Confusion beginning yesterday, altered mental status. 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  EXAM: MRI HEAD WITHOUT AND WITH CONTRAST  TECHNIQUE: Multiplanar, multiecho pulse sequences of the brain and surrounding structures were obtained without and with intravenous contrast.  CONTRAST:  37mL MULTIHANCE GADOBENATE DIMEGLUMINE 529 MG/ML IV SOLN  COMPARISON:  CT head March 24, 2015 at 2026 hours  FINDINGS: Mildly motion degraded examination.  The ventricles and sulci are normal for patient's age. No suspicious parenchymal signal, mass lesions, mass effect. Scattered subcentimeter supratentorial white matter T2 hyperintensities. 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 No abnormal parenchymal enhancement. RIGHT frontal developmental venous anomaly. No reduced  diffusion to suggest acute ischemia. No susceptibility artifact to suggest hemorrhage.  No abnormal extra-axial fluid collections. No extra-axial masses nor leptomeningeal enhancement. Normal major intracranial vascular flow voids seen at the skull base.  Status post bilateral ocular lens implants. No suspicious calvarial bone marrow signal. No abnormal sellar expansion. Craniocervical junction maintained. Visualized paranasal sinuses and mastoid air cells are well-aerated.  IMPRESSION: No acute intracranial process.  Involutional changes. Mild chronic small vessel ischemic disease. 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  RIGHT frontal developmental venous anomaly, benign finding.   Electronically Signed   By: Elon Alas M.D.   On: 03/24/2015 23:19   Dg Chest Port 1 View  03/25/2015   CLINICAL DATA:  Lymphoma.  EXAM: PORTABLE CHEST - 1 VIEW  COMPARISON:  03/07/2015.  FINDINGS: Mediastinum hilar structures are normal. Low lung volumes. Heart size stable. No pleural effusion or pneumothorax . No acute bony abnormality .  IMPRESSION: Low lung volumes. No acute cardiopulmonary disease noted. Chest is stable from prior exam.   Electronically Signed   By: [place]   On: 03/25/2015 07:53   Dg Abd Portable 1v  03/26/2015   CLINICAL DATA:  Ileus  EXAM: PORTABLE ABDOMEN - 1 VIEW  COMPARISON:  None.  FINDINGS: Scattered large and small bowel gas is noted. No obstructive changes are seen. No abnormal mass or abnormal calcifications are noted. No free air is noted.  IMPRESSION: No acute abnormality noted.   Electronically Signed   By: Inez Catalina M.D.   On: 03/26/2015 12:09    Impression: The patient is recovering from the effects of radiation. Patient is still experiencing pain in  right foot. Patient has worsening wound soreness on bottom. We discussed the importance of changing positions and relief of pressure in the bottom region.  Plan:  Follow up in 4 months. Patient will continue follow up with  medical oncology.     ------------------------------------------------  Jodelle Gross, MD, PhD  This document serves as a record of services personally performed by Kyung Rudd, MD. It was created on his behalf by Derek Mound, a trained medical scribe. The creation of this record is based on the scribe's personal observations and the provider's statements to them. This document has been checked and approved by the attending provider.

## 2015-04-02 DIAGNOSIS — G9340 Encephalopathy, unspecified: Secondary | ICD-10-CM | POA: Diagnosis not present

## 2015-04-02 DIAGNOSIS — D72829 Elevated white blood cell count, unspecified: Secondary | ICD-10-CM | POA: Diagnosis not present

## 2015-04-02 DIAGNOSIS — C8380 Other non-follicular lymphoma, unspecified site: Secondary | ICD-10-CM | POA: Diagnosis not present

## 2015-04-02 DIAGNOSIS — L89302 Pressure ulcer of unspecified buttock, stage 2: Secondary | ICD-10-CM | POA: Diagnosis not present

## 2015-04-02 DIAGNOSIS — M21371 Foot drop, right foot: Secondary | ICD-10-CM | POA: Diagnosis not present

## 2015-04-02 DIAGNOSIS — E440 Moderate protein-calorie malnutrition: Secondary | ICD-10-CM | POA: Diagnosis not present

## 2015-04-04 DIAGNOSIS — L89302 Pressure ulcer of unspecified buttock, stage 2: Secondary | ICD-10-CM | POA: Diagnosis not present

## 2015-04-04 DIAGNOSIS — M21371 Foot drop, right foot: Secondary | ICD-10-CM | POA: Diagnosis not present

## 2015-04-04 DIAGNOSIS — G9340 Encephalopathy, unspecified: Secondary | ICD-10-CM | POA: Diagnosis not present

## 2015-04-04 DIAGNOSIS — E440 Moderate protein-calorie malnutrition: Secondary | ICD-10-CM | POA: Diagnosis not present

## 2015-04-04 DIAGNOSIS — D72829 Elevated white blood cell count, unspecified: Secondary | ICD-10-CM | POA: Diagnosis not present

## 2015-04-04 DIAGNOSIS — C8380 Other non-follicular lymphoma, unspecified site: Secondary | ICD-10-CM | POA: Diagnosis not present

## 2015-04-05 ENCOUNTER — Ambulatory Visit: Payer: Medicare Other | Admitting: Nutrition

## 2015-04-05 ENCOUNTER — Ambulatory Visit (HOSPITAL_BASED_OUTPATIENT_CLINIC_OR_DEPARTMENT_OTHER): Payer: Medicare Other | Admitting: Nurse Practitioner

## 2015-04-05 ENCOUNTER — Other Ambulatory Visit (HOSPITAL_BASED_OUTPATIENT_CLINIC_OR_DEPARTMENT_OTHER): Payer: Medicare Other

## 2015-04-05 ENCOUNTER — Ambulatory Visit (HOSPITAL_BASED_OUTPATIENT_CLINIC_OR_DEPARTMENT_OTHER): Payer: Medicare Other

## 2015-04-05 VITALS — BP 91/65 | HR 66 | Temp 98.4°F | Resp 18 | Ht 72.0 in | Wt 140.0 lb

## 2015-04-05 VITALS — BP 88/58 | HR 90 | Temp 98.5°F | Resp 18

## 2015-04-05 DIAGNOSIS — Z5111 Encounter for antineoplastic chemotherapy: Secondary | ICD-10-CM | POA: Diagnosis not present

## 2015-04-05 DIAGNOSIS — M79604 Pain in right leg: Secondary | ICD-10-CM | POA: Diagnosis not present

## 2015-04-05 DIAGNOSIS — C8519 Unspecified B-cell lymphoma, extranodal and solid organ sites: Secondary | ICD-10-CM

## 2015-04-05 DIAGNOSIS — Z5112 Encounter for antineoplastic immunotherapy: Secondary | ICD-10-CM | POA: Diagnosis not present

## 2015-04-05 DIAGNOSIS — C8580 Other specified types of non-Hodgkin lymphoma, unspecified site: Secondary | ICD-10-CM

## 2015-04-05 DIAGNOSIS — R5383 Other fatigue: Secondary | ICD-10-CM | POA: Diagnosis not present

## 2015-04-05 DIAGNOSIS — R630 Anorexia: Secondary | ICD-10-CM

## 2015-04-05 DIAGNOSIS — M79671 Pain in right foot: Secondary | ICD-10-CM | POA: Diagnosis not present

## 2015-04-05 LAB — COMPREHENSIVE METABOLIC PANEL (CC13)
ALBUMIN: 2.6 g/dL — AB (ref 3.5–5.0)
ALK PHOS: 75 U/L (ref 40–150)
ALT: 25 U/L (ref 0–55)
ANION GAP: 10 meq/L (ref 3–11)
AST: 20 U/L (ref 5–34)
BUN: 9.3 mg/dL (ref 7.0–26.0)
CALCIUM: 8.8 mg/dL (ref 8.4–10.4)
CO2: 19 meq/L — AB (ref 22–29)
Chloride: 109 mEq/L (ref 98–109)
Creatinine: 0.8 mg/dL (ref 0.7–1.3)
Glucose: 120 mg/dl (ref 70–140)
Potassium: 4.3 mEq/L (ref 3.5–5.1)
Sodium: 137 mEq/L (ref 136–145)
TOTAL PROTEIN: 5.8 g/dL — AB (ref 6.4–8.3)
Total Bilirubin: 0.52 mg/dL (ref 0.20–1.20)

## 2015-04-05 LAB — CBC WITH DIFFERENTIAL/PLATELET
BASO%: 0.7 % (ref 0.0–2.0)
Basophils Absolute: 0.1 10*3/uL (ref 0.0–0.1)
EOS ABS: 0 10*3/uL (ref 0.0–0.5)
EOS%: 0.4 % (ref 0.0–7.0)
HCT: 40.2 % (ref 38.4–49.9)
HGB: 13 g/dL (ref 13.0–17.1)
LYMPH%: 8.8 % — AB (ref 14.0–49.0)
MCH: 29.4 pg (ref 27.2–33.4)
MCHC: 32.5 g/dL (ref 32.0–36.0)
MCV: 90.7 fL (ref 79.3–98.0)
MONO#: 1.2 10*3/uL — AB (ref 0.1–0.9)
MONO%: 11 % (ref 0.0–14.0)
NEUT%: 79.1 % — ABNORMAL HIGH (ref 39.0–75.0)
NEUTROS ABS: 8.5 10*3/uL — AB (ref 1.5–6.5)
PLATELETS: 420 10*3/uL — AB (ref 140–400)
RBC: 4.43 10*6/uL (ref 4.20–5.82)
RDW: 16.4 % — ABNORMAL HIGH (ref 11.0–14.6)
WBC: 10.8 10*3/uL — AB (ref 4.0–10.3)
lymph#: 1 10*3/uL (ref 0.9–3.3)

## 2015-04-05 LAB — LACTATE DEHYDROGENASE (CC13): LDH: 241 U/L (ref 125–245)

## 2015-04-05 MED ORDER — DIPHENHYDRAMINE HCL 25 MG PO CAPS
ORAL_CAPSULE | ORAL | Status: AC
  Filled 2015-04-05: qty 1

## 2015-04-05 MED ORDER — ACETAMINOPHEN 325 MG PO TABS
ORAL_TABLET | ORAL | Status: AC
  Filled 2015-04-05: qty 2

## 2015-04-05 MED ORDER — DIPHENHYDRAMINE HCL 25 MG PO CAPS
25.0000 mg | ORAL_CAPSULE | Freq: Once | ORAL | Status: AC
  Administered 2015-04-05: 25 mg via ORAL

## 2015-04-05 MED ORDER — SODIUM CHLORIDE 0.9 % IV SOLN
Freq: Once | INTRAVENOUS | Status: AC
  Administered 2015-04-05: 12:00:00 via INTRAVENOUS

## 2015-04-05 MED ORDER — DOXORUBICIN HCL CHEMO IV INJECTION 2 MG/ML
50.0000 mg/m2 | Freq: Once | INTRAVENOUS | Status: AC
  Administered 2015-04-05: 94 mg via INTRAVENOUS
  Filled 2015-04-05: qty 47

## 2015-04-05 MED ORDER — SODIUM CHLORIDE 0.9 % IV SOLN: Freq: Once | INTRAVENOUS | Status: DC

## 2015-04-05 MED ORDER — TRAMADOL HCL 50 MG PO TABS
50.0000 mg | ORAL_TABLET | Freq: Once | ORAL | Status: AC
  Administered 2015-04-05: 50 mg via ORAL

## 2015-04-05 MED ORDER — SODIUM CHLORIDE 0.9 % IV SOLN
375.0000 mg/m2 | Freq: Once | INTRAVENOUS | Status: AC
  Administered 2015-04-05: 700 mg via INTRAVENOUS
  Filled 2015-04-05: qty 70

## 2015-04-05 MED ORDER — FAMOTIDINE 20 MG PO TABS
ORAL_TABLET | ORAL | Status: AC
  Filled 2015-04-05: qty 1

## 2015-04-05 MED ORDER — VINCRISTINE SULFATE CHEMO INJECTION 1 MG/ML
2.0000 mg | Freq: Once | INTRAVENOUS | Status: AC
  Administered 2015-04-05: 2 mg via INTRAVENOUS
  Filled 2015-04-05: qty 2

## 2015-04-05 MED ORDER — SODIUM CHLORIDE 0.9 % IV SOLN
Freq: Once | INTRAVENOUS | Status: AC
  Administered 2015-04-05: 12:00:00 via INTRAVENOUS
  Filled 2015-04-05: qty 8

## 2015-04-05 MED ORDER — DEXAMETHASONE SODIUM PHOSPHATE 100 MG/10ML IJ SOLN
10.0000 mg | Freq: Once | INTRAMUSCULAR | Status: DC
  Filled 2015-04-05: qty 1

## 2015-04-05 MED ORDER — FAMOTIDINE 20 MG PO TABS
20.0000 mg | ORAL_TABLET | Freq: Once | ORAL | Status: AC
  Administered 2015-04-05: 20 mg via ORAL

## 2015-04-05 MED ORDER — SODIUM CHLORIDE 0.9 % IV SOLN
750.0000 mg/m2 | Freq: Once | INTRAVENOUS | Status: AC
  Administered 2015-04-05: 1420 mg via INTRAVENOUS
  Filled 2015-04-05: qty 71

## 2015-04-05 MED ORDER — ACETAMINOPHEN 325 MG PO TABS
650.0000 mg | ORAL_TABLET | Freq: Once | ORAL | Status: AC
  Administered 2015-04-05: 650 mg via ORAL

## 2015-04-05 MED ORDER — FAMOTIDINE IN NACL 20-0.9 MG/50ML-% IV SOLN
INTRAVENOUS | Status: AC
  Filled 2015-04-05: qty 50

## 2015-04-05 NOTE — Progress Notes (Addendum)
  [place] OFFICE PROGRESS NOTE   Diagnosis:  Non-Hodgkin's lymphoma  INTERVAL HISTORY:   Mr. Molstad returns as scheduled. He completed cycle 1 CHOP/Rituxan 03/15/2015. He denies nausea/vomiting around the time of chemotherapy. No mouth sores. He was recently constipated. He took a laxative and then had loose stools 3 yesterday. He took Imodium. No bowel movement so far today. Appetite is poor. No fevers or sweats. His wife notes the confusion is much better. He continues to feel weak. Right leg/foot pain continues to be improved. He is no longer taking pain medication.  Objective:  Vital signs in last 24 hours:  Blood pressure 91/65, pulse 66, temperature 98.4 F (36.9 C), temperature source Oral, resp. rate 18, height 6' (1.829 m), weight 140 lb (63.504 kg), SpO2 100 %.    HEENT: No thrush or ulcers. Resp: Lungs clear bilaterally. Cardio: Regular rate and rhythm. GI: Abdomen soft and nontender. No organomegaly. Vascular: Pitting edema right lower leg/foot. No edema left leg. Neuro: Decreased strength with dorsiflexion of the right foot. Alert and oriented. Skin: Skin in general has a dry appearance.    Lab Results:  Lab Results  Component Value Date   WBC 10.8* 04/05/2015   HGB 13.0 04/05/2015   HCT 40.2 04/05/2015   MCV 90.7 04/05/2015   PLT 420* 04/05/2015   NEUTROABS 8.5* 04/05/2015    Imaging:  No results found.  Medications: I have reviewed the patient's current medications.  Assessment/Plan: 1. 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 Thrombocytopenia predating chemotherapy 3. T7 compression fracture-likely pathologic 4. 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 Right foot pain-likely secondary to nerve compression from the sacral mass, improved 6. Diffuse erythematous rash, appearance consistent with a "drug "rash-improved 7. History of Low-grade fever likely secondary to atelectasis/postop fever versus tumor fever 8. Hepatitis B core antibody positive 9. Altered mental status-likely secondary to polypharmacy-improved   Disposition: Mr. Huberty appears stable. He has completed 1 cycle of CHOP/Rituxan. Plan to proceed with cycle 2 today as scheduled. He will return for a follow-up CBC in one week. He will return for a follow-up visit in 2 weeks. He will contact the office in the interim with any problems.  Patient seen with Dr. Benay Spice.  Ned Card ANP/GNP-BC   04/05/2015  10:10 AM  This was a shared visit with Ned Card.  Perry Nielsen will complete cycle 2 CHOP/Rituxan today. He will again receive Neulasta support. He will return for an office visit and nadir CBC in 2 weeks.  Julieanne Manson, M.D.

## 2015-04-05 NOTE — Progress Notes (Signed)
74 year old male diagnosed with non-Hodgkin's lymphoma.  He is a patient of Dr. Julieanne Manson.  Past medical history includes GERD.  Medications include Pepcid, Compazine, and K-dur  Labs include albumin 2.6 on September 30.  Height: 6 feet 0 inches. Weight: 140 pounds September 30. Usual body weight: 173 pounds in April 2016. BMI: 18.98.  Patient reports ongoing problems with diarrhea. States he takes after his mother who was diagnosed with a "bowel" disease. Endorses increased fatigue and weakness secondary from having to go to the bathroom frequently. Patient denies nausea at this time. Patient is afraid to eat.  Nutrition diagnosis: Unintended weight loss related to inadequate oral intake as evidenced by 14% weight loss over 5 months.  Intervention: Educated patient on strategies for increasing calories and protein in small frequent meals and snacks. Recommended patient try ensure clear or boost breeze and provided a sample and coupons. Provided detailed education on foods to avoid and foods to include when having diarrhea.  Fact sheets were provided. Questions were answered.  Teach back method was used.  Contact information was given.  Monitoring, evaluation, goals: Patient will tolerate increased calories and protein with ensure clear three times daily between meals to minimize further weight loss.  Next visit: Friday, October 21 during infusion.  **Disclaimer: This note was dictated with voice recognition software. Similar sounding words can inadvertently be transcribed and this note may contain transcription errors which may not have been corrected upon publication of note.**

## 2015-04-05 NOTE — Progress Notes (Signed)
Reports pian in bootom of right foot and restless legs . Pt reports the restless legs started after benadryl .

## 2015-04-05 NOTE — Patient Instructions (Signed)
[facility] Discharge Instructions for Patients Receiving Chemotherapy  Today you received the following chemotherapy agents adriamycin, vincristine, cytoxan. You also received Rituxan , an antibody.  To help prevent nausea and vomiting after your treatment, we encourage you to take your nausea medication as prescribed   If you develop nausea and vomiting that is not controlled by your nausea medication, call the clinic.   BELOW ARE SYMPTOMS THAT SHOULD BE REPORTED IMMEDIATELY:  *FEVER GREATER THAN 100.5 F  *CHILLS WITH OR WITHOUT FEVER  NAUSEA AND VOMITING THAT IS NOT CONTROLLED WITH YOUR NAUSEA MEDICATION  *UNUSUAL SHORTNESS OF BREATH  *UNUSUAL BRUISING OR BLEEDING  TENDERNESS IN MOUTH AND THROAT WITH OR WITHOUT PRESENCE OF ULCERS  *URINARY PROBLEMS  *BOWEL PROBLEMS  UNUSUAL RASH Items with * indicate a potential emergency and should be followed up as soon as possible.  Feel free to call the clinic you have any questions or concerns. The clinic phone number is [phone].  Please show the [facility] at check-in to the Emergency Department and triage nurse.

## 2015-04-06 ENCOUNTER — Ambulatory Visit: Payer: Medicare Other

## 2015-04-08 ENCOUNTER — Ambulatory Visit (HOSPITAL_BASED_OUTPATIENT_CLINIC_OR_DEPARTMENT_OTHER): Payer: Medicare Other

## 2015-04-08 DIAGNOSIS — C8580 Other specified types of non-Hodgkin lymphoma, unspecified site: Secondary | ICD-10-CM

## 2015-04-08 DIAGNOSIS — C8519 Unspecified B-cell lymphoma, extranodal and solid organ sites: Secondary | ICD-10-CM

## 2015-04-08 DIAGNOSIS — Z5189 Encounter for other specified aftercare: Secondary | ICD-10-CM | POA: Diagnosis not present

## 2015-04-08 MED ORDER — PEGFILGRASTIM INJECTION 6 MG/0.6ML ~~LOC~~
6.0000 mg | PREFILLED_SYRINGE | Freq: Once | SUBCUTANEOUS | Status: AC
  Administered 2015-04-08: 6 mg via SUBCUTANEOUS
  Filled 2015-04-08: qty 0.6

## 2015-04-08 NOTE — Patient Instructions (Signed)
Pegfilgrastim injection What is this medicine? PEGFILGRASTIM (peg fil GRA stim) is a long-acting granulocyte colony-stimulating factor that stimulates the growth of neutrophils, a type of white blood cell important in the body's fight against infection. It is used to reduce the incidence of fever and infection in patients with certain types of cancer who are receiving chemotherapy that affects the bone marrow. This medicine may be used for other purposes; ask your health care provider or pharmacist if you have questions. COMMON BRAND NAME(S): Neulasta What should I tell my health care provider before I take this medicine? They need to know if you have any of these conditions: -latex allergy -ongoing radiation therapy -sickle cell disease -skin reactions to acrylic adhesives (On-Body Injector only) -an unusual or allergic reaction to pegfilgrastim, filgrastim, other medicines, foods, dyes, or preservatives -pregnant or trying to get pregnant -breast-feeding How should I use this medicine? This medicine is for injection under the skin. If you get this medicine at home, you will be taught how to prepare and give the pre-filled syringe or how to use the On-body Injector. Refer to the patient Instructions for Use for detailed instructions. Use exactly as directed. Take your medicine at regular intervals. Do not take your medicine more often than directed. It is important that you put your used needles and syringes in a special sharps container. Do not put them in a trash can. If you do not have a sharps container, call your pharmacist or healthcare provider to get one. Talk to your pediatrician regarding the use of this medicine in children. Special care may be needed. Overdosage: If you think you have taken too much of this medicine contact a poison control center or emergency room at once. NOTE: This medicine is only for you. Do not share this medicine with others. What if I miss a dose? It is  important not to miss your dose. Call your doctor or health care professional if you miss your dose. If you miss a dose due to an On-body Injector failure or leakage, a new dose should be administered as soon as possible using a single prefilled syringe for manual use. What may interact with this medicine? Interactions have not been studied. Give your health care provider a list of all the medicines, herbs, non-prescription drugs, or dietary supplements you use. Also tell them if you smoke, drink alcohol, or use illegal drugs. Some items may interact with your medicine. This list may not describe all possible interactions. Give your health care provider a list of all the medicines, herbs, non-prescription drugs, or dietary supplements you use. Also tell them if you smoke, drink alcohol, or use illegal drugs. Some items may interact with your medicine. What should I watch for while using this medicine? You may need blood work done while you are taking this medicine. If you are going to need a MRI, CT scan, or other procedure, tell your doctor that you are using this medicine (On-Body Injector only). What side effects may I notice from receiving this medicine? Side effects that you should report to your doctor or health care professional as soon as possible: -allergic reactions like skin rash, itching or hives, swelling of the face, lips, or tongue -dizziness -fever -pain, redness, or irritation at site where injected -pinpoint red spots on the skin -shortness of breath or breathing problems -stomach or side pain, or pain at the shoulder -swelling -tiredness -trouble passing urine Side effects that usually do not require medical attention (report to your doctor   or health care professional if they continue or are bothersome): -bone pain -muscle pain This list may not describe all possible side effects. Call your doctor for medical advice about side effects. You may report side effects to FDA at  1-800-FDA-1088. Where should I keep my medicine? Keep out of the reach of children. Store pre-filled syringes in a refrigerator between 2 and 8 degrees C (36 and 46 degrees F). Do not freeze. Keep in carton to protect from light. Throw away this medicine if it is left out of the refrigerator for more than 48 hours. Throw away any unused medicine after the expiration date. NOTE: This sheet is a summary. It may not cover all possible information. If you have questions about this medicine, talk to your doctor, pharmacist, or health care provider.  2015, Elsevier/Gold Standard. (2013-09-21 16:14:05)  

## 2015-04-09 DIAGNOSIS — C8380 Other non-follicular lymphoma, unspecified site: Secondary | ICD-10-CM | POA: Diagnosis not present

## 2015-04-09 DIAGNOSIS — M21371 Foot drop, right foot: Secondary | ICD-10-CM | POA: Diagnosis not present

## 2015-04-09 DIAGNOSIS — L89302 Pressure ulcer of unspecified buttock, stage 2: Secondary | ICD-10-CM | POA: Diagnosis not present

## 2015-04-09 DIAGNOSIS — E440 Moderate protein-calorie malnutrition: Secondary | ICD-10-CM | POA: Diagnosis not present

## 2015-04-09 DIAGNOSIS — D72829 Elevated white blood cell count, unspecified: Secondary | ICD-10-CM | POA: Diagnosis not present

## 2015-04-09 DIAGNOSIS — G9340 Encephalopathy, unspecified: Secondary | ICD-10-CM | POA: Diagnosis not present

## 2015-04-11 DIAGNOSIS — C8380 Other non-follicular lymphoma, unspecified site: Secondary | ICD-10-CM | POA: Diagnosis not present

## 2015-04-11 DIAGNOSIS — G9340 Encephalopathy, unspecified: Secondary | ICD-10-CM | POA: Diagnosis not present

## 2015-04-11 DIAGNOSIS — L89302 Pressure ulcer of unspecified buttock, stage 2: Secondary | ICD-10-CM | POA: Diagnosis not present

## 2015-04-11 DIAGNOSIS — D72829 Elevated white blood cell count, unspecified: Secondary | ICD-10-CM | POA: Diagnosis not present

## 2015-04-11 DIAGNOSIS — E440 Moderate protein-calorie malnutrition: Secondary | ICD-10-CM | POA: Diagnosis not present

## 2015-04-11 DIAGNOSIS — M21371 Foot drop, right foot: Secondary | ICD-10-CM | POA: Diagnosis not present

## 2015-04-15 DIAGNOSIS — D72829 Elevated white blood cell count, unspecified: Secondary | ICD-10-CM | POA: Diagnosis not present

## 2015-04-15 DIAGNOSIS — E440 Moderate protein-calorie malnutrition: Secondary | ICD-10-CM | POA: Diagnosis not present

## 2015-04-15 DIAGNOSIS — G9340 Encephalopathy, unspecified: Secondary | ICD-10-CM | POA: Diagnosis not present

## 2015-04-15 DIAGNOSIS — C8380 Other non-follicular lymphoma, unspecified site: Secondary | ICD-10-CM | POA: Diagnosis not present

## 2015-04-15 DIAGNOSIS — L89302 Pressure ulcer of unspecified buttock, stage 2: Secondary | ICD-10-CM | POA: Diagnosis not present

## 2015-04-15 DIAGNOSIS — M21371 Foot drop, right foot: Secondary | ICD-10-CM | POA: Diagnosis not present

## 2015-04-16 ENCOUNTER — Ambulatory Visit (INDEPENDENT_AMBULATORY_CARE_PROVIDER_SITE_OTHER): Payer: Medicare Other | Admitting: Family Medicine

## 2015-04-16 ENCOUNTER — Encounter: Payer: Self-pay | Admitting: Family Medicine

## 2015-04-16 VITALS — BP 116/88 | HR 102 | Temp 97.7°F | Ht 70.5 in | Wt 137.2 lb

## 2015-04-16 DIAGNOSIS — C8580 Other specified types of non-Hodgkin lymphoma, unspecified site: Secondary | ICD-10-CM

## 2015-04-16 MED ORDER — LORAZEPAM 0.5 MG PO TABS
0.5000 mg | ORAL_TABLET | Freq: Two times a day (BID) | ORAL | Status: DC | PRN
Start: 2015-04-16 — End: 2015-11-11

## 2015-04-16 NOTE — Progress Notes (Signed)
   Subjective:    Patient ID: Perry Nielsen., male    DOB: Oct 07, 1940, 74 y.o.   MRN: [id number]  HPI [name] is a 74 year old married male nonsmoker who was recently diagnosed with large cell lymphoma. He comes in today for general medical evaluation  He was recently hospitalized for confusion. Etiology after a five-day hospital stay was not determined. Who thought it may of been a medication related phenomenon. He had low potassium and was discharged on 20 mEq twice a day which she took for about a week. He's not taking pain medication. He stopped his allergy medicine and after gadolinium the hospital he stopped the trazodone. He been taken 100 mg daily to help with sleep dysfunction. This is been prior to his cancer diagnosis.  He has his foot wrapped up and he uses a 4. 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  He's due to go back and see his oncologist on Friday   Review of Systems Review of systems pertinent these lost his appetite. His lost 3 pounds since last week. He says he is afraid to eat because of he gets diarrhea and can't get to the bathroom    Objective:   Physical Exam  Well-developed well-nourished male no acute distress he appears very pale.  Conjunctiva pale,,,,,,, will defer to oncology when he gets his lab work on Friday      Assessment & Plan:  Large cell lymphoma.......... follow-up appointment in oncology in 3 days  Multiple symptoms.......... defer to oncology to try to figure out if it's a medication related phenomena or cancer related phenomenon  Sleep dysfunction.......... continue the trazodone 100 mg at bedtime add Ativan 0.5 at bedtime or twice a day when necessary for anxiety

## 2015-04-16 NOTE — Patient Instructions (Addendum)
Trazodone 100 mg.........Marland Kitchen 1 at bedtime for sleep  Ativan 0.5...........Marland Kitchen 1 at bedtime for 4 weeks then stop........Marland Kitchen or he may take 1 tablet twice daily as needed for anxiety  Ask the oncologist if you can receive the flu vaccine  When they check your labs be sure they check a CBC and a metabolic panel........Marland Kitchen because of the paleness and your history of low potassium  Also ask your oncologist on Friday if you can take Neurontin to help with the neuropathy

## 2015-04-17 ENCOUNTER — Encounter (HOSPITAL_COMMUNITY): Payer: Self-pay | Admitting: *Deleted

## 2015-04-17 ENCOUNTER — Inpatient Hospital Stay (HOSPITAL_COMMUNITY)
Admission: EM | Admit: 2015-04-17 | Discharge: 2015-04-23 | DRG: 438 | Disposition: A | Discharge status: Home Health | Payer: Medicare Other | Attending: Internal Medicine | Admitting: Internal Medicine

## 2015-04-17 DIAGNOSIS — R509 Fever, unspecified: Secondary | ICD-10-CM

## 2015-04-17 DIAGNOSIS — C8330 Diffuse large B-cell lymphoma, unspecified site: Secondary | ICD-10-CM | POA: Diagnosis present

## 2015-04-17 DIAGNOSIS — M8448XA Pathological fracture, other site, initial encounter for fracture: Secondary | ICD-10-CM | POA: Diagnosis present

## 2015-04-17 DIAGNOSIS — K529 Noninfective gastroenteritis and colitis, unspecified: Secondary | ICD-10-CM

## 2015-04-17 DIAGNOSIS — M21379 Foot drop, unspecified foot: Secondary | ICD-10-CM | POA: Diagnosis present

## 2015-04-17 DIAGNOSIS — K651 Peritoneal abscess: Secondary | ICD-10-CM | POA: Diagnosis present

## 2015-04-17 DIAGNOSIS — Z8042 Family history of malignant neoplasm of prostate: Secondary | ICD-10-CM

## 2015-04-17 DIAGNOSIS — C7951 Secondary malignant neoplasm of bone: Secondary | ICD-10-CM | POA: Diagnosis not present

## 2015-04-17 DIAGNOSIS — K219 Gastro-esophageal reflux disease without esophagitis: Secondary | ICD-10-CM | POA: Diagnosis present

## 2015-04-17 DIAGNOSIS — D849 Immunodeficiency, unspecified: Secondary | ICD-10-CM

## 2015-04-17 DIAGNOSIS — Z8049 Family history of malignant neoplasm of other genital organs: Secondary | ICD-10-CM

## 2015-04-17 DIAGNOSIS — C8580 Other specified types of non-Hodgkin lymphoma, unspecified site: Secondary | ICD-10-CM | POA: Diagnosis present

## 2015-04-17 DIAGNOSIS — Z9221 Personal history of antineoplastic chemotherapy: Secondary | ICD-10-CM

## 2015-04-17 DIAGNOSIS — Z9081 Acquired absence of spleen: Secondary | ICD-10-CM | POA: Diagnosis not present

## 2015-04-17 DIAGNOSIS — Z791 Long term (current) use of non-steroidal anti-inflammatories (NSAID): Secondary | ICD-10-CM

## 2015-04-17 DIAGNOSIS — A409 Streptococcal sepsis, unspecified: Secondary | ICD-10-CM

## 2015-04-17 DIAGNOSIS — N401 Enlarged prostate with lower urinary tract symptoms: Secondary | ICD-10-CM | POA: Diagnosis present

## 2015-04-17 DIAGNOSIS — Z8249 Family history of ischemic heart disease and other diseases of the circulatory system: Secondary | ICD-10-CM

## 2015-04-17 DIAGNOSIS — R600 Localized edema: Secondary | ICD-10-CM

## 2015-04-17 DIAGNOSIS — Z9841 Cataract extraction status, right eye: Secondary | ICD-10-CM

## 2015-04-17 DIAGNOSIS — R4182 Altered mental status, unspecified: Secondary | ICD-10-CM | POA: Diagnosis not present

## 2015-04-17 DIAGNOSIS — K863 Pseudocyst of pancreas: Secondary | ICD-10-CM | POA: Diagnosis not present

## 2015-04-17 DIAGNOSIS — A419 Sepsis, unspecified organism: Secondary | ICD-10-CM | POA: Diagnosis not present

## 2015-04-17 DIAGNOSIS — Z85828 Personal history of other malignant neoplasm of skin: Secondary | ICD-10-CM | POA: Diagnosis not present

## 2015-04-17 DIAGNOSIS — R188 Other ascites: Secondary | ICD-10-CM | POA: Diagnosis not present

## 2015-04-17 DIAGNOSIS — R531 Weakness: Secondary | ICD-10-CM | POA: Diagnosis present

## 2015-04-17 DIAGNOSIS — I82492 Acute embolism and thrombosis of other specified deep vein of left lower extremity: Secondary | ICD-10-CM | POA: Diagnosis present

## 2015-04-17 DIAGNOSIS — Z961 Presence of intraocular lens: Secondary | ICD-10-CM | POA: Diagnosis present

## 2015-04-17 DIAGNOSIS — I82491 Acute embolism and thrombosis of other specified deep vein of right lower extremity: Secondary | ICD-10-CM | POA: Diagnosis present

## 2015-04-17 DIAGNOSIS — R410 Disorientation, unspecified: Secondary | ICD-10-CM | POA: Diagnosis present

## 2015-04-17 DIAGNOSIS — D696 Thrombocytopenia, unspecified: Secondary | ICD-10-CM | POA: Diagnosis not present

## 2015-04-17 DIAGNOSIS — I82409 Acute embolism and thrombosis of unspecified deep veins of unspecified lower extremity: Secondary | ICD-10-CM | POA: Diagnosis present

## 2015-04-17 DIAGNOSIS — D7889 Other postprocedural complications of the spleen: Secondary | ICD-10-CM | POA: Diagnosis not present

## 2015-04-17 DIAGNOSIS — Z79899 Other long term (current) drug therapy: Secondary | ICD-10-CM | POA: Diagnosis not present

## 2015-04-17 DIAGNOSIS — G589 Mononeuropathy, unspecified: Secondary | ICD-10-CM | POA: Diagnosis present

## 2015-04-17 DIAGNOSIS — C8590 Non-Hodgkin lymphoma, unspecified, unspecified site: Secondary | ICD-10-CM | POA: Diagnosis not present

## 2015-04-17 DIAGNOSIS — M21371 Foot drop, right foot: Secondary | ICD-10-CM | POA: Diagnosis not present

## 2015-04-17 DIAGNOSIS — R351 Nocturia: Secondary | ICD-10-CM | POA: Diagnosis present

## 2015-04-17 DIAGNOSIS — I82411 Acute embolism and thrombosis of right femoral vein: Secondary | ICD-10-CM | POA: Diagnosis not present

## 2015-04-17 DIAGNOSIS — I82431 Acute embolism and thrombosis of right popliteal vein: Secondary | ICD-10-CM | POA: Diagnosis present

## 2015-04-17 DIAGNOSIS — D899 Disorder involving the immune mechanism, unspecified: Secondary | ICD-10-CM

## 2015-04-17 DIAGNOSIS — I82441 Acute embolism and thrombosis of right tibial vein: Secondary | ICD-10-CM | POA: Diagnosis not present

## 2015-04-17 DIAGNOSIS — C8519 Unspecified B-cell lymphoma, extranodal and solid organ sites: Secondary | ICD-10-CM | POA: Diagnosis not present

## 2015-04-17 DIAGNOSIS — R634 Abnormal weight loss: Secondary | ICD-10-CM | POA: Diagnosis not present

## 2015-04-17 DIAGNOSIS — R109 Unspecified abdominal pain: Secondary | ICD-10-CM | POA: Diagnosis not present

## 2015-04-17 DIAGNOSIS — Z9842 Cataract extraction status, left eye: Secondary | ICD-10-CM

## 2015-04-17 DIAGNOSIS — I82401 Acute embolism and thrombosis of unspecified deep veins of right lower extremity: Secondary | ICD-10-CM | POA: Diagnosis not present

## 2015-04-17 DIAGNOSIS — Z5111 Encounter for antineoplastic chemotherapy: Secondary | ICD-10-CM | POA: Diagnosis not present

## 2015-04-17 DIAGNOSIS — Z888 Allergy status to other drugs, medicaments and biological substances status: Secondary | ICD-10-CM

## 2015-04-17 DIAGNOSIS — N529 Male erectile dysfunction, unspecified: Secondary | ICD-10-CM | POA: Diagnosis present

## 2015-04-17 NOTE — ED Notes (Addendum)
Pt complaining of lack of appetite, insomnia and generalized weakness. Had diarrhea this morning. States NO abdominal pain. Had chemo 2 weeks ago Friday and is scheduled to have his 3rd dose in one week.

## 2015-04-17 NOTE — ED Notes (Signed)
Pt reports poor food and fluid intake over the last several weeks; pt c/o generalized weakness; insomnia and malaise; pt wife reports fever at home of 101 this evening; pt c/o diarrhea over the last few weeks; pt had Chemo 2 weeks

## 2015-04-18 ENCOUNTER — Inpatient Hospital Stay (HOSPITAL_COMMUNITY): Payer: Medicare Other

## 2015-04-18 ENCOUNTER — Emergency Department (HOSPITAL_COMMUNITY): Payer: Medicare Other

## 2015-04-18 ENCOUNTER — Encounter (HOSPITAL_COMMUNITY): Payer: Self-pay

## 2015-04-18 DIAGNOSIS — I82411 Acute embolism and thrombosis of right femoral vein: Secondary | ICD-10-CM | POA: Diagnosis present

## 2015-04-18 DIAGNOSIS — R600 Localized edema: Secondary | ICD-10-CM

## 2015-04-18 DIAGNOSIS — K651 Peritoneal abscess: Secondary | ICD-10-CM | POA: Diagnosis not present

## 2015-04-18 DIAGNOSIS — R410 Disorientation, unspecified: Secondary | ICD-10-CM | POA: Diagnosis present

## 2015-04-18 DIAGNOSIS — R1012 Left upper quadrant pain: Secondary | ICD-10-CM | POA: Diagnosis not present

## 2015-04-18 DIAGNOSIS — K219 Gastro-esophageal reflux disease without esophagitis: Secondary | ICD-10-CM

## 2015-04-18 DIAGNOSIS — I82491 Acute embolism and thrombosis of other specified deep vein of right lower extremity: Secondary | ICD-10-CM | POA: Diagnosis present

## 2015-04-18 DIAGNOSIS — M8448XA Pathological fracture, other site, initial encounter for fracture: Secondary | ICD-10-CM | POA: Diagnosis present

## 2015-04-18 DIAGNOSIS — Z5111 Encounter for antineoplastic chemotherapy: Secondary | ICD-10-CM | POA: Diagnosis not present

## 2015-04-18 DIAGNOSIS — Z9842 Cataract extraction status, left eye: Secondary | ICD-10-CM | POA: Diagnosis not present

## 2015-04-18 DIAGNOSIS — G589 Mononeuropathy, unspecified: Secondary | ICD-10-CM | POA: Diagnosis present

## 2015-04-18 DIAGNOSIS — R509 Fever, unspecified: Secondary | ICD-10-CM | POA: Diagnosis not present

## 2015-04-18 DIAGNOSIS — Z8042 Family history of malignant neoplasm of prostate: Secondary | ICD-10-CM | POA: Diagnosis not present

## 2015-04-18 DIAGNOSIS — C8580 Other specified types of non-Hodgkin lymphoma, unspecified site: Secondary | ICD-10-CM | POA: Diagnosis not present

## 2015-04-18 DIAGNOSIS — C8330 Diffuse large B-cell lymphoma, unspecified site: Secondary | ICD-10-CM | POA: Diagnosis present

## 2015-04-18 DIAGNOSIS — N401 Enlarged prostate with lower urinary tract symptoms: Secondary | ICD-10-CM | POA: Diagnosis present

## 2015-04-18 DIAGNOSIS — R531 Weakness: Secondary | ICD-10-CM | POA: Diagnosis present

## 2015-04-18 DIAGNOSIS — R634 Abnormal weight loss: Secondary | ICD-10-CM | POA: Diagnosis present

## 2015-04-18 DIAGNOSIS — I82409 Acute embolism and thrombosis of unspecified deep veins of unspecified lower extremity: Secondary | ICD-10-CM | POA: Diagnosis not present

## 2015-04-18 DIAGNOSIS — Z9221 Personal history of antineoplastic chemotherapy: Secondary | ICD-10-CM | POA: Diagnosis not present

## 2015-04-18 DIAGNOSIS — R188 Other ascites: Secondary | ICD-10-CM | POA: Diagnosis not present

## 2015-04-18 DIAGNOSIS — K863 Pseudocyst of pancreas: Secondary | ICD-10-CM | POA: Diagnosis not present

## 2015-04-18 DIAGNOSIS — I82441 Acute embolism and thrombosis of right tibial vein: Secondary | ICD-10-CM | POA: Diagnosis present

## 2015-04-18 DIAGNOSIS — Z888 Allergy status to other drugs, medicaments and biological substances status: Secondary | ICD-10-CM | POA: Diagnosis not present

## 2015-04-18 DIAGNOSIS — Z791 Long term (current) use of non-steroidal anti-inflammatories (NSAID): Secondary | ICD-10-CM | POA: Diagnosis not present

## 2015-04-18 DIAGNOSIS — Z9841 Cataract extraction status, right eye: Secondary | ICD-10-CM | POA: Diagnosis not present

## 2015-04-18 DIAGNOSIS — C8590 Non-Hodgkin lymphoma, unspecified, unspecified site: Secondary | ICD-10-CM | POA: Diagnosis not present

## 2015-04-18 DIAGNOSIS — I82431 Acute embolism and thrombosis of right popliteal vein: Secondary | ICD-10-CM | POA: Diagnosis present

## 2015-04-18 DIAGNOSIS — M21371 Foot drop, right foot: Secondary | ICD-10-CM | POA: Diagnosis not present

## 2015-04-18 DIAGNOSIS — Z8249 Family history of ischemic heart disease and other diseases of the circulatory system: Secondary | ICD-10-CM | POA: Diagnosis not present

## 2015-04-18 DIAGNOSIS — Z79899 Other long term (current) drug therapy: Secondary | ICD-10-CM | POA: Diagnosis not present

## 2015-04-18 DIAGNOSIS — C8519 Unspecified B-cell lymphoma, extranodal and solid organ sites: Secondary | ICD-10-CM | POA: Diagnosis not present

## 2015-04-18 DIAGNOSIS — A419 Sepsis, unspecified organism: Secondary | ICD-10-CM | POA: Diagnosis present

## 2015-04-18 DIAGNOSIS — R109 Unspecified abdominal pain: Secondary | ICD-10-CM | POA: Diagnosis not present

## 2015-04-18 DIAGNOSIS — Z961 Presence of intraocular lens: Secondary | ICD-10-CM | POA: Diagnosis present

## 2015-04-18 DIAGNOSIS — Z9081 Acquired absence of spleen: Secondary | ICD-10-CM | POA: Diagnosis not present

## 2015-04-18 DIAGNOSIS — I82401 Acute embolism and thrombosis of unspecified deep veins of right lower extremity: Secondary | ICD-10-CM | POA: Diagnosis not present

## 2015-04-18 DIAGNOSIS — Z85828 Personal history of other malignant neoplasm of skin: Secondary | ICD-10-CM | POA: Diagnosis not present

## 2015-04-18 DIAGNOSIS — C7951 Secondary malignant neoplasm of bone: Secondary | ICD-10-CM | POA: Diagnosis not present

## 2015-04-18 DIAGNOSIS — I82492 Acute embolism and thrombosis of other specified deep vein of left lower extremity: Secondary | ICD-10-CM | POA: Diagnosis present

## 2015-04-18 DIAGNOSIS — R351 Nocturia: Secondary | ICD-10-CM | POA: Diagnosis present

## 2015-04-18 DIAGNOSIS — D696 Thrombocytopenia, unspecified: Secondary | ICD-10-CM | POA: Diagnosis present

## 2015-04-18 DIAGNOSIS — Z8049 Family history of malignant neoplasm of other genital organs: Secondary | ICD-10-CM | POA: Diagnosis not present

## 2015-04-18 DIAGNOSIS — R4182 Altered mental status, unspecified: Secondary | ICD-10-CM | POA: Diagnosis not present

## 2015-04-18 DIAGNOSIS — N529 Male erectile dysfunction, unspecified: Secondary | ICD-10-CM | POA: Diagnosis present

## 2015-04-18 LAB — CBC WITH DIFFERENTIAL/PLATELET
BASOS ABS: 0.1 10*3/uL (ref 0.0–0.1)
BASOS PCT: 0 %
Basophils Absolute: 0 10*3/uL (ref 0.0–0.1)
Basophils Relative: 0 %
EOS PCT: 0 %
Eosinophils Absolute: 0 10*3/uL (ref 0.0–0.7)
Eosinophils Absolute: 0 10*3/uL (ref 0.0–0.7)
Eosinophils Relative: 0 %
HCT: 29.6 % — ABNORMAL LOW (ref 39.0–52.0)
HEMATOCRIT: 29.5 % — AB (ref 39.0–52.0)
HEMOGLOBIN: 10 g/dL — AB (ref 13.0–17.0)
Hemoglobin: 10 g/dL — ABNORMAL LOW (ref 13.0–17.0)
LYMPHS ABS: 0.5 10*3/uL — AB (ref 0.7–4.0)
LYMPHS PCT: 4 %
Lymphocytes Relative: 6 %
Lymphs Abs: 0.8 10*3/uL (ref 0.7–4.0)
MCH: 30.1 pg (ref 26.0–34.0)
MCH: 30.1 pg (ref 26.0–34.0)
MCHC: 33.8 g/dL (ref 30.0–36.0)
MCHC: 33.9 g/dL (ref 30.0–36.0)
MCV: 88.9 fL (ref 78.0–100.0)
MCV: 89.2 fL (ref 78.0–100.0)
MONO ABS: 0.9 10*3/uL (ref 0.1–1.0)
Monocytes Absolute: 1.3 10*3/uL — ABNORMAL HIGH (ref 0.1–1.0)
Monocytes Relative: 11 %
Monocytes Relative: 7 %
NEUTROS ABS: 11.6 10*3/uL — AB (ref 1.7–7.7)
NEUTROS PCT: 85 %
NEUTROS PCT: 87 %
Neutro Abs: 10 10*3/uL — ABNORMAL HIGH (ref 1.7–7.7)
PLATELETS: 239 10*3/uL (ref 150–400)
Platelets: 270 10*3/uL (ref 150–400)
RBC: 3.32 MIL/uL — AB (ref 4.22–5.81)
RBC: 3.32 MIL/uL — ABNORMAL LOW (ref 4.22–5.81)
RDW: 16.2 % — AB (ref 11.5–15.5)
RDW: 16.3 % — AB (ref 11.5–15.5)
WBC: 11.9 10*3/uL — AB (ref 4.0–10.5)
WBC: 13.3 10*3/uL — ABNORMAL HIGH (ref 4.0–10.5)

## 2015-04-18 LAB — COMPREHENSIVE METABOLIC PANEL
ALBUMIN: 2.5 g/dL — AB (ref 3.5–5.0)
ALT: 37 U/L (ref 17–63)
ALT: 34 U/L (ref 17–63)
ANION GAP: 8 (ref 5–15)
AST: 24 U/L (ref 15–41)
AST: 24 U/L (ref 15–41)
Albumin: 2.3 g/dL — ABNORMAL LOW (ref 3.5–5.0)
Alkaline Phosphatase: 76 U/L (ref 38–126)
Alkaline Phosphatase: 74 U/L (ref 38–126)
Anion gap: 8 (ref 5–15)
BUN: 6 mg/dL (ref 6–20)
BUN: 9 mg/dL (ref 6–20)
CHLORIDE: 100 mmol/L — AB (ref 101–111)
CHLORIDE: 103 mmol/L (ref 101–111)
CO2: 23 mmol/L (ref 22–32)
CO2: 26 mmol/L (ref 22–32)
CREATININE: 0.59 mg/dL — AB (ref 0.61–1.24)
Calcium: 8.5 mg/dL — ABNORMAL LOW (ref 8.9–10.3)
Calcium: 7.8 mg/dL — ABNORMAL LOW (ref 8.9–10.3)
Creatinine, Ser: 0.78 mg/dL (ref 0.61–1.24)
GFR calc Af Amer: >60 mL/min (ref >60)
GFR calc non Af Amer: >60 mL/min (ref >60)
Glucose, Bld: 104 mg/dL — ABNORMAL HIGH (ref 65–99)
Glucose, Bld: 98 mg/dL (ref 65–99)
POTASSIUM: 3.5 mmol/L (ref 3.5–5.1)
Potassium: 3.4 mmol/L — ABNORMAL LOW (ref 3.5–5.1)
SODIUM: 134 mmol/L — AB (ref 135–145)
SODIUM: 134 mmol/L — AB (ref 135–145)
Total Bilirubin: 0.7 mg/dL (ref 0.3–1.2)
Total Bilirubin: 0.6 mg/dL (ref 0.3–1.2)
Total Protein: 5.5 g/dL — ABNORMAL LOW (ref 6.5–8.1)
Total Protein: 5.2 g/dL — ABNORMAL LOW (ref 6.5–8.1)

## 2015-04-18 LAB — URINALYSIS, ROUTINE W REFLEX MICROSCOPIC
BILIRUBIN URINE: NEGATIVE
Glucose, UA: NEGATIVE mg/dL
Hgb urine dipstick: NEGATIVE
KETONES UR: NEGATIVE mg/dL
LEUKOCYTES UA: NEGATIVE
NITRITE: NEGATIVE
PROTEIN: NEGATIVE mg/dL
Specific Gravity, Urine: 1.045 — ABNORMAL HIGH (ref 1.005–1.030)
UROBILINOGEN UA: 1 mg/dL (ref 0.0–1.0)
pH: 6.5 (ref 5.0–8.0)

## 2015-04-18 LAB — SEDIMENTATION RATE: Sed Rate: 85 mm/hr — ABNORMAL HIGH (ref 0–16)

## 2015-04-18 LAB — PROTIME-INR
INR: 1.26 (ref 0.00–1.49)
INR: 1.34 (ref 0.00–1.49)
Prothrombin Time: 15.9 seconds — ABNORMAL HIGH (ref 11.6–15.2)
Prothrombin Time: 16.7 seconds — ABNORMAL HIGH (ref 11.6–15.2)

## 2015-04-18 LAB — AMYLASE: AMYLASE: 167 U/L — AB (ref 28–100)

## 2015-04-18 LAB — I-STAT CG4 LACTIC ACID, ED
LACTIC ACID, VENOUS: 0.89 mmol/L (ref 0.5–2.0)
LACTIC ACID, VENOUS: 1.69 mmol/L (ref 0.5–2.0)

## 2015-04-18 LAB — C-REACTIVE PROTEIN: CRP: 19.6 mg/dL — AB (ref <1.0)

## 2015-04-18 LAB — LIPASE, BLOOD: LIPASE: 60 U/L — AB (ref 22–51)

## 2015-04-18 MED ORDER — ENOXAPARIN SODIUM 80 MG/0.8ML ~~LOC~~ SOLN
65.0000 mg | Freq: Once | SUBCUTANEOUS | Status: AC
  Administered 2015-04-18: 65 mg via SUBCUTANEOUS
  Filled 2015-04-18: qty 0.8

## 2015-04-18 MED ORDER — OXYMETAZOLINE HCL 0.05 % NA SOLN
1.0000 | Freq: Two times a day (BID) | NASAL | Status: DC | PRN
  Filled 2015-04-18: qty 15

## 2015-04-18 MED ORDER — ONDANSETRON HCL 4 MG/2ML IJ SOLN: 4.0000 mg | Freq: Four times a day (QID) | INTRAMUSCULAR | Status: DC | PRN

## 2015-04-18 MED ORDER — CETYLPYRIDINIUM CHLORIDE 0.05 % MT LIQD
7.0000 mL | Freq: Two times a day (BID) | OROMUCOSAL | Status: DC
  Administered 2015-04-18 – 2015-04-21 (×7): 7 mL via OROMUCOSAL

## 2015-04-18 MED ORDER — ACETAMINOPHEN 650 MG RE SUPP: 650.0000 mg | Freq: Four times a day (QID) | RECTAL | Status: DC | PRN

## 2015-04-18 MED ORDER — FAMOTIDINE 20 MG PO TABS
20.0000 mg | ORAL_TABLET | Freq: Every day | ORAL | Status: DC
Start: 2015-04-18 — End: 2015-04-23
  Administered 2015-04-18 – 2015-04-23 (×6): 20 mg via ORAL
  Filled 2015-04-18 (×6): qty 1

## 2015-04-18 MED ORDER — CHLORHEXIDINE GLUCONATE 0.12 % MT SOLN
15.0000 mL | Freq: Two times a day (BID) | OROMUCOSAL | Status: DC
  Administered 2015-04-18 – 2015-04-22 (×9): 15 mL via OROMUCOSAL
  Filled 2015-04-18 (×9): qty 15

## 2015-04-18 MED ORDER — SODIUM CHLORIDE 0.9 % IV SOLN
INTRAVENOUS | Status: DC
  Administered 2015-04-19 – 2015-04-20 (×3): via INTRAVENOUS

## 2015-04-18 MED ORDER — SODIUM CHLORIDE 0.9 % IJ SOLN
3.0000 mL | Freq: Two times a day (BID) | INTRAMUSCULAR | Status: DC
  Administered 2015-04-18 – 2015-04-22 (×8): 3 mL via INTRAVENOUS

## 2015-04-18 MED ORDER — SODIUM CHLORIDE 0.9 % IV BOLUS (SEPSIS)
1000.0000 mL | INTRAVENOUS | Status: AC
  Administered 2015-04-18 (×2): 1000 mL via INTRAVENOUS

## 2015-04-18 MED ORDER — IOHEXOL 300 MG/ML  SOLN
100.0000 mL | Freq: Once | INTRAMUSCULAR | Status: AC | PRN
  Administered 2015-04-18: 100 mL via INTRAVENOUS

## 2015-04-18 MED ORDER — FLUTICASONE PROPIONATE 50 MCG/ACT NA SUSP
1.0000 | Freq: Every day | NASAL | Status: DC | PRN
  Filled 2015-04-18: qty 16

## 2015-04-18 MED ORDER — LORAZEPAM 0.5 MG PO TABS
0.5000 mg | ORAL_TABLET | Freq: Two times a day (BID) | ORAL | Status: DC | PRN
  Administered 2015-04-20 – 2015-04-22 (×3): 0.5 mg via ORAL
  Filled 2015-04-18 (×4): qty 1

## 2015-04-18 MED ORDER — ACETAMINOPHEN 325 MG PO TABS
650.0000 mg | ORAL_TABLET | Freq: Four times a day (QID) | ORAL | Status: DC | PRN
  Administered 2015-04-20 – 2015-04-22 (×3): 650 mg via ORAL
  Filled 2015-04-18 (×3): qty 2

## 2015-04-18 MED ORDER — DEXTROSE 5 % IV SOLN
2.0000 g | Freq: Three times a day (TID) | INTRAVENOUS | Status: DC
  Administered 2015-04-18 – 2015-04-21 (×9): 2 g via INTRAVENOUS
  Filled 2015-04-18 (×10): qty 2

## 2015-04-18 MED ORDER — VANCOMYCIN HCL IN DEXTROSE 1-5 GM/200ML-% IV SOLN
1000.0000 mg | Freq: Once | INTRAVENOUS | Status: AC
  Administered 2015-04-18: 1000 mg via INTRAVENOUS
  Filled 2015-04-18: qty 200

## 2015-04-18 MED ORDER — VANCOMYCIN HCL IN DEXTROSE 750-5 MG/150ML-% IV SOLN
750.0000 mg | Freq: Three times a day (TID) | INTRAVENOUS | Status: DC
  Administered 2015-04-18 – 2015-04-21 (×9): 750 mg via INTRAVENOUS
  Filled 2015-04-18 (×10): qty 150

## 2015-04-18 MED ORDER — ENOXAPARIN SODIUM 40 MG/0.4ML ~~LOC~~ SOLN
40.0000 mg | SUBCUTANEOUS | Status: DC
  Administered 2015-04-18: 40 mg via SUBCUTANEOUS
  Filled 2015-04-18: qty 0.4

## 2015-04-18 MED ORDER — METRONIDAZOLE IN NACL 5-0.79 MG/ML-% IV SOLN
500.0000 mg | Freq: Once | INTRAVENOUS | Status: AC
  Administered 2015-04-18: 500 mg via INTRAVENOUS
  Filled 2015-04-18: qty 100

## 2015-04-18 MED ORDER — ONDANSETRON HCL 4 MG PO TABS: 4.0000 mg | ORAL_TABLET | Freq: Four times a day (QID) | ORAL | Status: DC | PRN

## 2015-04-18 MED ORDER — TRAZODONE HCL 100 MG PO TABS
100.0000 mg | ORAL_TABLET | Freq: Every day | ORAL | Status: DC
  Administered 2015-04-18 – 2015-04-22 (×5): 100 mg via ORAL
  Filled 2015-04-18 (×5): qty 1

## 2015-04-18 MED ORDER — DEXTROSE 5 % IV SOLN
2.0000 g | Freq: Once | INTRAVENOUS | Status: AC
  Administered 2015-04-18: 2 g via INTRAVENOUS
  Filled 2015-04-18: qty 2

## 2015-04-18 NOTE — ED Notes (Addendum)
Patient transported to CT. Went to room to get lactic sample. Pt in CT. Will get sample when he returns.

## 2015-04-18 NOTE — Progress Notes (Signed)
Follow-up note  RN reporting results of lower extremity Dopplers. 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   Will start Lovenox, consulted pharmacy for dosing. He has a history of lymphoma.  Nursing communication placed on Lovenox tomorrow morning as he will be undergoing procedure by interventional radiology.

## 2015-04-18 NOTE — Progress Notes (Addendum)
ANTIBIOTIC CONSULT NOTE - INITIAL  Pharmacy Consult for Vancomycin Indication: sepsis/intra-abdominal abscess  Allergies  Allergen Reactions  . Antihistamines, Diphenhydramine-Type     CAUSES PROSTATE TO SWELL & as a child had hyperactivity when took Benadryl Pt took Pepcid w/ Benadryl PO and did fine per Pt & his wife    Patient Measurements: Height: 6' (182.9 cm) Weight: 138 lb 14.2 oz (63 kg) IBW/kg (Calculated) : 77.6   Vital Signs: Temp: 98.4 F (36.9 C) (10/13 1340) Temp Source: Oral (10/13 1340) BP: 107/51 mmHg (10/13 1340) Pulse Rate: 102 (10/13 1340) Intake/Output from previous day:   Intake/Output from this shift:    Labs:  Recent Labs  04/18/15 0025 04/18/15 0735  WBC 11.9* 13.3*  HGB 10.0* 10.0*  PLT 239 270  CREATININE 0.78 0.59*   Estimated Creatinine Clearance: 73.3 mL/min (by C-G formula based on Cr of 0.59). No results for input(s): VANCOTROUGH, VANCOPEAK, VANCORANDOM, GENTTROUGH, GENTPEAK, GENTRANDOM, TOBRATROUGH, TOBRAPEAK, TOBRARND, AMIKACINPEAK, AMIKACINTROU, AMIKACIN in the last 72 hours.   Microbiology: Recent Results (from the past 720 hour(s))  Culture, blood (routine x 2)     Status: None   Collection Time: 03/26/15 12:20 PM  Result Value Ref Range Status   Specimen Description BLOOD LEFT ARM  Final   Special Requests BOTTLES DRAWN AEROBIC AND ANAEROBIC 10CC  Final   Culture   Final    NO GROWTH 5 DAYS Performed at [facility]    Report Status 03/31/2015 FINAL  Final  Culture, blood (routine x 2)     Status: None   Collection Time: 03/26/15 12:25 PM  Result Value Ref Range Status   Specimen Description BLOOD RIGHT HAND  Final   Special Requests BOTTLES DRAWN AEROBIC AND ANAEROBIC 5CC  Final   Culture   Final    NO GROWTH 5 DAYS Performed at [facility]    Report Status 03/31/2015 FINAL  Final    Medical History: Past Medical History  Diagnosis Date  . 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 ALLERGIC  RHINITIS 08/03/2007  . GERD 02/18/2007  . KNEE PAIN, LEFT, CHRONIC 08/07/2008  . BACK PAIN 09/18/2009  . HAMMER TOE 08/03/2007  . CAROTID BRUIT, LEFT 08/07/2008  . 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 Foot drop     rt foot  . Rash, skin     torso  . Hepatitis 1977    no residual problems  . Sleeping difficulty   . Skin cancer   . Lymphoma ([place])     Medications:  Scheduled:  . antiseptic oral rinse  7 mL Mouth Rinse q12n4p  . cefTAZidime (FORTAZ)  IV  2 g Intravenous 3 times per day  . chlorhexidine  15 mL Mouth Rinse BID  . enoxaparin (LOVENOX) injection  40 mg Subcutaneous Q24H  . famotidine  20 mg Oral Daily  . sodium chloride  3 mL Intravenous Q12H  . traZODone  100 mg Oral QHS   Infusions:  . sodium chloride     PRN: acetaminophen **OR** acetaminophen, fluticasone, LORazepam, ondansetron **OR** ondansetron (ZOFRAN) IV, oxymetazoline Assessment: 49 yoM with h/o lymphoma undergoing chemotherapy and s/p splenectomy for splenic mass. CAT scan shows extensive inflammation with loculated fluid collection suggesting possible infection or abscess. Ceftazidime started this morning for sepsis. Vanco 1gm IV x 1 given in the ED @0141  and abx is to be continued for sepsis and intra-abdominal abscess.  Goal of Therapy:  Vancomycin trough level 15-20 mcg/ml  Appropriate antibiotic dosing for indication and renal function. Eradication of infection  Plan:  Vancomycin 750mg  IV Q8h Will check Vanco trough level at steady state Will f/u Scr and Blood/Urine Cx    Garnet Sierras 04/18/2015,2:49 PM

## 2015-04-18 NOTE — Consult Note (Signed)
General Surgery [facility]  Reason for Consult: abdominal pain, abnormal CT findings  Referring Physician:  Dr. Coralyn Pear, [facility]  Perry Nielsen. is an 74 y.o. male.  HPI: patient admitted with 2-3 week hx of weight loss, poor appetite, nausea, diarrhea, abdominal pain.  CT scan in ER shows inflammatory process LUQ of abdomen involving splenic flexure of colon, greater curve of stomach, and pancreatic tail.  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  Currently under treatment with chemo Rx by Dr. Julieanne Manson.  Surgery asked to evaluate CT findings.  Past Medical History  Diagnosis Date  . 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 ALLERGIC RHINITIS 08/03/2007  . GERD 02/18/2007  . KNEE PAIN, LEFT, CHRONIC 08/07/2008  . BACK PAIN 09/18/2009  . HAMMER TOE 08/03/2007  . CAROTID BRUIT, LEFT 08/07/2008  . 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 Foot drop     rt foot  . Rash, skin     torso  . Hepatitis 1977    no residual problems  . Sleeping difficulty   . Skin cancer   . Lymphoma [facility])     Past Surgical History  Procedure Laterality Date  . Shoulder open rotator cuff repair  07/06/05    right  . Knee arthroscopy      left  . Tonsillectomy and adenoidectomy    . Transurethral resection of prostate    . Cataract extraction w/ intraocular lens  implant, bilateral    . 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 Heart attack Other   . Aneurysm Other     aortic  . Coronary artery disease Sister   . Cancer Father     prostate  . Pulmonary fibrosis Mother   . Uterine cancer Mother     Social History:  reports that he has never smoked. He does  not have any smokeless tobacco history on file. He reports that he drinks alcohol. He reports that he does not use illicit drugs.  Allergies:  Allergies  Allergen Reactions  . Antihistamines, Diphenhydramine-Type     CAUSES PROSTATE TO SWELL & as a child had hyperactivity when took Benadryl Pt took Pepcid w/ Benadryl PO and did fine per Pt & his wife    Medications: I have reviewed the patient's current medications.  Results for orders placed or performed during the hospital encounter of 04/17/15 (from the past 48 hour(s))  Comprehensive metabolic panel     Status: Abnormal   Collection Time: 04/18/15 12:25 AM  Result Value Ref Range   Sodium 134 (L) 135 - 145 mmol/L   Potassium 3.5 3.5 - 5.1 mmol/L   Chloride 100 (L) 101 - 111 mmol/L   CO2 26 22 - 32 mmol/L   Glucose, Bld 104 (H) 65 - 99 mg/dL   BUN 9 6 - 20 mg/dL   Creatinine, Ser 0.78 0.61 - 1.24 mg/dL   Calcium 8.5 (L) 8.9 - 10.3 mg/dL   Total Protein 5.5 (L) 6.5 - 8.1 g/dL   Albumin 2.5 (L) 3.5 - 5.0 g/dL   AST 24 15 - 41 U/L   ALT 37 17 - 63 U/L   Alkaline  Phosphatase 76 38 - 126 U/L   Total Bilirubin 0.7 0.3 - 1.2 mg/dL   GFR calc non Af Amer >60 >60 mL/min   GFR calc Af Amer >60 >60 mL/min    Comment: (NOTE) The eGFR has been calculated using the CKD EPI equation. This calculation has not been validated in all clinical situations. eGFR's persistently <60 mL/min signify possible Chronic Kidney Disease.    Anion gap 8 5 - 15  CBC WITH DIFFERENTIAL     Status: Abnormal   Collection Time: 04/18/15 12:25 AM  Result Value Ref Range   WBC 11.9 (H) 4.0 - 10.5 K/uL   RBC 3.32 (L) 4.22 - 5.81 MIL/uL   Hemoglobin 10.0 (L) 13.0 - 17.0 g/dL   HCT 29.6 (L) 39.0 - 52.0 %   MCV 89.2 78.0 - 100.0 fL   MCH 30.1 26.0 - 34.0 pg   MCHC 33.8 30.0 - 36.0 g/dL   RDW 16.2 (H) 11.5 - 15.5 %   Platelets 239 150 - 400 K/uL   Neutrophils Relative % 85 %   Neutro Abs 10.0 (H) 1.7 - 7.7 K/uL   Lymphocytes Relative 4 %   Lymphs Abs 0.5  (L) 0.7 - 4.0 K/uL   Monocytes Relative 11 %   Monocytes Absolute 1.3 (H) 0.1 - 1.0 K/uL   Eosinophils Relative 0 %   Eosinophils Absolute 0.0 0.0 - 0.7 K/uL   Basophils Relative 0 %   Basophils Absolute 0.0 0.0 - 0.1 K/uL  Protime-INR     Status: Abnormal   Collection Time: 04/18/15 12:25 AM  Result Value Ref Range   Prothrombin Time 15.9 (H) 11.6 - 15.2 seconds   INR 1.26 0.00 - 1.49  I-Stat CG4 Lactic Acid, ED  (not at  [facility])     Status: None   Collection Time: 04/18/15 12:30 AM  Result Value Ref Range   Lactic Acid, Venous 0.89 0.5 - 2.0 mmol/L  Urinalysis, Routine w reflex microscopic (not at [facility])     Status: Abnormal   Collection Time: 04/18/15  3:41 AM  Result Value Ref Range   Color, Urine YELLOW YELLOW   APPearance CLEAR CLEAR   Specific Gravity, Urine 1.045 (H) 1.005 - 1.030   pH 6.5 5.0 - 8.0   Glucose, UA NEGATIVE NEGATIVE mg/dL   Hgb urine dipstick NEGATIVE NEGATIVE   Bilirubin Urine NEGATIVE NEGATIVE   Ketones, ur NEGATIVE NEGATIVE mg/dL   Protein, ur NEGATIVE NEGATIVE mg/dL   Urobilinogen, UA 1.0 0.0 - 1.0 mg/dL   Nitrite NEGATIVE NEGATIVE   Leukocytes, UA NEGATIVE NEGATIVE    Comment: MICROSCOPIC NOT DONE ON URINES WITH NEGATIVE PROTEIN, BLOOD, LEUKOCYTES, NITRITE, OR GLUCOSE <1000 mg/dL.  I-Stat CG4 Lactic Acid, ED  (not at  [facility])     Status: None   Collection Time: 04/18/15  3:44 AM  Result Value Ref Range   Lactic Acid, Venous 1.69 0.5 - 2.0 mmol/L  CBC WITH DIFFERENTIAL     Status: Abnormal   Collection Time: 04/18/15  7:35 AM  Result Value Ref Range   WBC 13.3 (H) 4.0 - 10.5 K/uL   RBC 3.32 (L) 4.22 - 5.81 MIL/uL   Hemoglobin 10.0 (L) 13.0 - 17.0 g/dL   HCT 29.5 (L) 39.0 - 52.0 %   MCV 88.9 78.0 - 100.0 fL   MCH 30.1 26.0 - 34.0 pg   MCHC 33.9 30.0 - 36.0 g/dL   RDW 16.3 (H) 11.5 - 15.5 %   Platelets 270 150 - 400  K/uL   Neutrophils Relative % 87 %   Neutro Abs 11.6 (H) 1.7 - 7.7 K/uL   Lymphocytes Relative 6 %   Lymphs Abs 0.8 0.7 - 4.0 K/uL    Monocytes Relative 7 %   Monocytes Absolute 0.9 0.1 - 1.0 K/uL   Eosinophils Relative 0 %   Eosinophils Absolute 0.0 0.0 - 0.7 K/uL   Basophils Relative 0 %   Basophils Absolute 0.1 0.0 - 0.1 K/uL  Protime-INR     Status: Abnormal   Collection Time: 04/18/15  7:35 AM  Result Value Ref Range   Prothrombin Time 16.7 (H) 11.6 - 15.2 seconds   INR 1.34 0.00 - 1.49  Comprehensive metabolic panel     Status: Abnormal   Collection Time: 04/18/15  7:35 AM  Result Value Ref Range   Sodium 134 (L) 135 - 145 mmol/L   Potassium 3.4 (L) 3.5 - 5.1 mmol/L   Chloride 103 101 - 111 mmol/L   CO2 23 22 - 32 mmol/L   Glucose, Bld 98 65 - 99 mg/dL   BUN 6 6 - 20 mg/dL   Creatinine, Ser 0.59 (L) 0.61 - 1.24 mg/dL   Calcium 7.8 (L) 8.9 - 10.3 mg/dL   Total Protein 5.2 (L) 6.5 - 8.1 g/dL   Albumin 2.3 (L) 3.5 - 5.0 g/dL   AST 24 15 - 41 U/L   ALT 34 17 - 63 U/L   Alkaline Phosphatase 74 38 - 126 U/L   Total Bilirubin 0.6 0.3 - 1.2 mg/dL   GFR calc non Af Amer >60 >60 mL/min   GFR calc Af Amer >60 >60 mL/min    Comment: (NOTE) The eGFR has been calculated using the CKD EPI equation. This calculation has not been validated in all clinical situations. eGFR's persistently <60 mL/min signify possible Chronic Kidney Disease.    Anion gap 8 5 - 15  Sedimentation rate     Status: Abnormal   Collection Time: 04/18/15  7:35 AM  Result Value Ref Range   Sed Rate 85 (H) 0 - 16 mm/hr    Dg Chest 2 View  04/18/2015  CLINICAL DATA:  74 year old male with fever and diarrhea for 48 hours. Cancer patient with metastatic disease unknown primary currently undergoing chemotherapy. Subsequent encounter. EXAM: CHEST  2 VIEW COMPARISON:  03/25/2015 and earlier FINDINGS: Semi upright AP and lateral views of the chest. Stable lung volumes. Stable cardiac size and mediastinal contours. No pneumothorax or pulmonary edema. No pleural effusion or acute pulmonary opacity identified. Severe mid thoracic pathologic  compression fracture has progressed since August. Elsewhere stable visualized osseous structures. IMPRESSION: No acute cardiopulmonary abnormality. Electronically Signed   By: Genevie Ann M.D.   On: 04/18/2015 01:06   Ct Abdomen Pelvis W Contrast  04/18/2015  CLINICAL DATA:  74 year old male with abdominal pain weakness and fever. Undergoing chemotherapy. 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 Non-Hodgkin's lymphoma. Subsequent encounter. EXAM: CT ABDOMEN AND PELVIS WITH CONTRAST TECHNIQUE: Multidetector CT imaging of the abdomen and pelvis was performed using the standard protocol following bolus administration of intravenous contrast. CONTRAST:  168m OMNIPAQUE IOHEXOL 300 MG/ML  SOLN COMPARISON:  PET-CT 02/22/2015.  CT Abdomen and Pelvis 02/06/2015. FINDINGS: Lower lung volumes. Increased dependent opacity in both lungs most resembles atelectasis. No pericardial or pleural effusion. Destructive right sacral metastasis. Stable visualized osseous structures. Fluid in the distal colon. No pelvic free fluid. Unremarkable urinary bladder. Fluid in the sigmoid colon and descending colon. Abnormal left upper quadrant and splenic flexure  with thickened and inflamed colon surrounded by extensive mesenteric stranding and loculated fluid collections. The largest collection is 6.7 cm inseparable from the tail of the pancreas. Other loculations of fluid also track into the lesser sac. There is associated inflammatory stranding. However, the pancreatic parenchyma still appears fairly distinct. There is secondary involvement of some small bowel in the left upper quadrant. Proximal to these changes there is gaseous distension of the transverse colon and proximal splenic flexure. There is fluid in the right colon. Fluid-filled nondilated distal small bowel. Much of the proximal small bowel is decompressed. The inflammatory stranding also involves the residual gastrosplenic ligament and proximal stomach with associated wall  thickening. The distal stomach and duodenum are spared. However, the proximal jejunum is secondarily affected. No abdominal free fluid or free air. Liver, gallbladder, adrenal glands, and kidneys are stable. Portal venous system is patent. Major arterial structures are patent. Stable mild infrarenal abdominal aortic aneurysm measuring 3.1 cm diameter. Soft and calcified aortic plaque. No lymphadenopathy in the abdomen or pelvis. IMPRESSION: 1. Status post splenectomy since the prior CTs. Extensive inflammation in the left upper quadrant affecting in the splenic flexure of colon, the proximal small bowel, and the proximal stomach. Associated loculated fluid collections, the largest of which (6.7 cm) is inseparable from the pancreatic tail and most resembles a pseudocyst. Changes track into the lesser sac and I favor these are the sequelae of pancreatitis although the pancreatic enhancement appears relatively normal. Main differential consideration is post splenectomy infection. 2. No other acute or inflammatory process identified in the abdomen or pelvis. 3. Destructive right sacral metastasis appears stable. 4. Mild pulmonary atelectasis. Electronically Signed   By: Genevie Ann M.D.   On: 04/18/2015 03:02    Review of Systems  Constitutional: Positive for fever, weight loss, malaise/fatigue and diaphoresis.  HENT: Negative.   Eyes: Negative.   Respiratory: Negative.   Cardiovascular: Negative.   Gastrointestinal: Positive for nausea, abdominal pain and diarrhea.  Genitourinary: Negative.   Musculoskeletal: Negative.   Skin: Negative.   Neurological: Positive for weakness.  Endo/Heme/Allergies: Negative.   Psychiatric/Behavioral: Negative.    Blood pressure 118/85, pulse 98, temperature 99.2 F (37.3 C), temperature source Oral, resp. rate 20, height 6' (1.829 m), weight 63 kg (138 lb 14.2 oz), SpO2 98 %. Physical Exam  Constitutional: He is oriented to person, place, and time. He appears  well-developed and well-nourished. No distress.  HENT:  Head: Normocephalic and atraumatic.  Right Ear: External ear normal.  Left Ear: External ear normal.  Eyes: Conjunctivae are normal. Pupils are equal, round, and reactive to light. No scleral icterus.  Neck: Normal range of motion. Neck supple. No tracheal deviation present. No thyromegaly present.  Cardiovascular: Normal rate, regular rhythm and normal heart sounds.   No murmur heard. Respiratory: Effort normal and breath sounds normal. No respiratory distress. He has no wheezes.  GI: Soft. Bowel sounds are normal. He exhibits no distension and no mass. There is tenderness (mild upper abdominal and LUQ tenderness without mass). There is no rebound and no guarding.  Surgical incisions well healed with topical glue still partially in place  Musculoskeletal: Normal range of motion. He exhibits no edema.  Splint on right ankle  Neurological: He is alert and oriented to person, place, and time.  Skin: Skin is warm and dry. He is not diaphoretic.  Psychiatric: He has a normal mood and affect. His behavior is normal.    Assessment/Plan:  Large B-cell lymphoma with bony involvement  Chemo Rx per Dr. Benay Spice Inflammatory process LUQ  May represent abscess versus pseudocyst  Will ask IR to evaluate for percutaneous aspiration / drainage  Would send fluid for cultures, lipase, amylase  Will send blood for lipase, amylase levels  Will follow with you.  Earnstine Regal, MD, [facility] Office: [place] 04/18/2015, 9:12 AM

## 2015-04-18 NOTE — H&P (Signed)
[facility] History and Physical  Patient: Perry Nielsen.  MRN: [id number]  DOB: 1940/07/27  DOS: the patient was seen and examined on 04/18/2015 PCP: TODD,JEFFREY ALLEN, MD  Referring physician: dr Kathrynn Humble Chief Complaint: Fatigue and fever  HPI: Perry Nielsen. is a 74 y.o. male with Past medical history of lymphoma undergoing chemotherapy, splenic mass status post splenectomy, GERD . The patient is presenting with complaints of fatigue with poor oral intake as well as weight loss. His ongoing since last one week. No diarrhea no constipation. Next and no burning urination. Patient has chronic congestion. No cough no chest pain or abdominal pain. No dizziness no lightheadedness no focal deficit. On examination patient did have significant pain on the left side of the stomach which he mentions has been present for a while.  The patient is coming from home.  At his baseline ambulates with support And is independent for most of his ADL; manages his medication on his own.  Review of Systems: as mentioned in the history of present illness.  A comprehensive review of the other systems is negative.  Past Medical History  Diagnosis Date  . 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 ALLERGIC RHINITIS 08/03/2007  . GERD 02/18/2007  . KNEE PAIN, LEFT, CHRONIC 08/07/2008  . BACK PAIN 09/18/2009  . HAMMER TOE 08/03/2007  . CAROTID BRUIT, LEFT 08/07/2008  . 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 Foot drop     rt foot  . Rash, skin     torso  . Hepatitis 1977    no residual problems  . Sleeping difficulty   . Skin cancer   . Lymphoma [facility])    Past Surgical History  Procedure Laterality Date  . Shoulder open rotator cuff repair  07/06/05    right  . Knee arthroscopy      left  . Tonsillectomy and adenoidectomy    . Transurethral resection of prostate    . Cataract extraction w/ intraocular lens  implant, bilateral     . 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 He does not have any smokeless tobacco history on file. He reports that he drinks alcohol. He reports that he does not use illicit drugs.  Allergies  Allergen Reactions  . Antihistamines, Diphenhydramine-Type     CAUSES PROSTATE TO SWELL & as a child had hyperactivity when took Benadryl Pt took Pepcid w/ Benadryl PO and did fine per Pt & his wife    Family History  Problem Relation Age of Onset  . Heart attack Other   . Aneurysm Other     aortic  . Coronary artery disease Sister   . Cancer Father     prostate  . Pulmonary fibrosis Mother   . Uterine cancer Mother     Prior to Admission medications   Medication Sig Start Date End Date Taking? Authorizing Provider  famotidine (PEPCID) 20 MG tablet Take 20 mg by mouth daily.    Yes Historical Provider, MD  ibuprofen (ADVIL,MOTRIN) 200 MG tablet Take 400 mg by mouth every 4 (four) hours as needed for fever, headache, mild pain, moderate pain or cramping.   Yes Historical Provider, MD  loperamide (IMODIUM A-D) 2 MG tablet Take 2 mg by mouth 4 (four)  times daily as needed for diarrhea or loose stools.   Yes Historical Provider, MD  loratadine (CLARITIN) 10 MG tablet Take 10 mg by mouth daily.   Yes Historical Provider, MD  LORazepam (ATIVAN) 0.5 MG tablet Take 1 tablet (0.5 mg total) by mouth 2 (two) times daily as needed for anxiety. 04/16/15  Yes Dorena Cookey, MD  oxymetazoline (AFRIN) 0.05 % nasal spray Place 1 spray into both nostrils 2 (two) times daily as needed for congestion.   Yes Historical Provider, MD  potassium chloride SA (K-DUR,KLOR-CON) 20 MEQ tablet Take 1 tablet (20 mEq total) by mouth 2 (two) times daily. 03/29/15  Yes Ladell Pier, MD  traZODone (DESYREL) 100 MG tablet Take 1 tablet (100 mg  total) by mouth at bedtime. 03/27/15  Yes Annita Brod, MD  feeding supplement, ENSURE ENLIVE, (ENSURE ENLIVE) LIQD Take 237 mLs by mouth 2 (two) times daily between meals. Patient not taking: Reported on 04/01/2015 03/27/15   Annita Brod, MD  fluticasone [facility]) 50 MCG/ACT nasal spray Place 1 spray into both nostrils daily as needed for allergies.     Historical Provider, MD  HYDROmorphone (DILAUDID) 2 MG tablet Take 1 tablet (2 mg total) by mouth every 4 (four) hours as needed for severe pain. Patient not taking: Reported on 04/01/2015 03/27/15   Annita Brod, MD  [place]    Historical Provider, MD  prochlorperazine (COMPAZINE) 5 MG tablet Take 1-2 tablets (5-10 mg total) by mouth every 6 (six) hours as needed for nausea or vomiting. Patient not taking: Reported on 04/01/2015 03/14/15   Owens Shark, NP  sorbitol 70 % solution Take 30 mLs by mouth 2 (two) times daily. Patient not taking: Reported on 04/01/2015 02/14/15   Ladell Pier, MD    Physical Exam: Filed Vitals:   04/17/15 2254 04/18/15 0031 04/18/15 0042 04/18/15 0222  BP: 113/75   111/61  Pulse: 117   99  Temp: 99.5 F (37.5 C)  101.9 F (38.8 C) 99.2 F (37.3 C)  TempSrc: Oral  Rectal Oral  Resp: 18   20  Height: 6' (1.829 m) 6' (1.829 m)    Weight: 62.143 kg (137 lb) 62.143 kg (137 lb)    SpO2: 99%   93%    General: Alert, Awake and Oriented to Time, Place and Person. Appear in mild distress Eyes: PERRL ENT: Oral Mucosa clear moist. Neck: no JVD Cardiovascular: S1 and S2 Present, no Murmur, Peripheral Pulses Present Respiratory: Bilateral Air entry equal and Decreased,  Clear to Auscultation, no Crackles, no wheezes Abdomen: Bowel Sound present, Soft and (epigastric and left upper quadrant tenderness Skin: no Rash Extremities: Left Pedal edema, no calf tenderness Neurologic: Grossly no focal neuro deficit other than foot drop left side  Labs on Admission:  CBC:  Recent  Labs Lab 04/18/15 0025  WBC 11.9*  NEUTROABS 10.0*  HGB 10.0*  HCT 29.6*  MCV 89.2  PLT 239    CMP     Component Value Date/Time   NA 134* 04/18/2015 0025   NA 137 04/05/2015 0851   K 3.5 04/18/2015 0025   K 4.3 04/05/2015 0851   CL 100* 04/18/2015 0025   CO2 26 04/18/2015 0025   CO2 19* 04/05/2015 0851   GLUCOSE 104* 04/18/2015 0025   GLUCOSE 120 04/05/2015 0851   GLUCOSE 91 04/23/2006 1003   BUN 9 04/18/2015 0025   BUN 9.3 04/05/2015 0851   CREATININE 0.78 04/18/2015 0025  CREATININE 0.8 04/05/2015 0851   CALCIUM 8.5* 04/18/2015 0025   CALCIUM 8.8 04/05/2015 0851   PROT 5.5* 04/18/2015 0025   PROT 5.8* 04/05/2015 0851   ALBUMIN 2.5* 04/18/2015 0025   ALBUMIN 2.6* 04/05/2015 0851   AST 24 04/18/2015 0025   AST 20 04/05/2015 0851   ALT 37 04/18/2015 0025   ALT 25 04/05/2015 0851   ALKPHOS 76 04/18/2015 0025   ALKPHOS 75 04/05/2015 0851   BILITOT 0.7 04/18/2015 0025   BILITOT 0.52 04/05/2015 0851   GFRNONAA >60 04/18/2015 0025   GFRAA >60 04/18/2015 0025    No results for input(s): CKTOTAL, CKMB, CKMBINDEX, TROPONINI in the last 168 hours. BNP (last 3 results) No results for input(s): BNP in the last 8760 hours.  ProBNP (last 3 results) No results for input(s): PROBNP in the last 8760 hours.   Radiological Exams on Admission: Dg Chest 2 View  04/18/2015  CLINICAL DATA:  74 year old male with fever and diarrhea for 48 hours. Cancer patient with metastatic disease unknown primary currently undergoing chemotherapy. Subsequent encounter. EXAM: CHEST  2 VIEW COMPARISON:  03/25/2015 and earlier FINDINGS: Semi upright AP and lateral views of the chest. Stable lung volumes. Stable cardiac size and mediastinal contours. No pneumothorax or pulmonary edema. No pleural effusion or acute pulmonary opacity identified. Severe mid thoracic pathologic compression fracture has progressed since August. Elsewhere stable visualized osseous structures. IMPRESSION: No acute  cardiopulmonary abnormality. Electronically Signed   By: Genevie Ann M.D.   On: 04/18/2015 01:06   Ct Abdomen Pelvis W Contrast  04/18/2015  CLINICAL DATA:  74 year old male with abdominal pain weakness and fever. Undergoing chemotherapy. 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 Non-Hodgkin's lymphoma. Subsequent encounter. EXAM: CT ABDOMEN AND PELVIS WITH CONTRAST TECHNIQUE: Multidetector CT imaging of the abdomen and pelvis was performed using the standard protocol following bolus administration of intravenous contrast. CONTRAST:  124m OMNIPAQUE IOHEXOL 300 MG/ML  SOLN COMPARISON:  PET-CT 02/22/2015.  CT Abdomen and Pelvis 02/06/2015. FINDINGS: Lower lung volumes. Increased dependent opacity in both lungs most resembles atelectasis. No pericardial or pleural effusion. Destructive right sacral metastasis. Stable visualized osseous structures. Fluid in the distal colon. No pelvic free fluid. Unremarkable urinary bladder. Fluid in the sigmoid colon and descending colon. Abnormal left upper quadrant and splenic flexure with thickened and inflamed colon surrounded by extensive mesenteric stranding and loculated fluid collections. The largest collection is 6.7 cm inseparable from the tail of the pancreas. Other loculations of fluid also track into the lesser sac. There is associated inflammatory stranding. However, the pancreatic parenchyma still appears fairly distinct. There is secondary involvement of some small bowel in the left upper quadrant. Proximal to these changes there is gaseous distension of the transverse colon and proximal splenic flexure. There is fluid in the right colon. Fluid-filled nondilated distal small bowel. Much of the proximal small bowel is decompressed. The inflammatory stranding also involves the residual gastrosplenic ligament and proximal stomach with associated wall thickening. The distal stomach and duodenum are spared. However, the proximal jejunum is secondarily affected. No abdominal  free fluid or free air. Liver, gallbladder, adrenal glands, and kidneys are stable. Portal venous system is patent. Major arterial structures are patent. Stable mild infrarenal abdominal aortic aneurysm measuring 3.1 cm diameter. Soft and calcified aortic plaque. No lymphadenopathy in the abdomen or pelvis. IMPRESSION: 1. Status post splenectomy since the prior CTs. Extensive inflammation in the left upper quadrant affecting in the splenic flexure of colon, the proximal small bowel, and the proximal stomach. Associated  loculated fluid collections, the largest of which (6.7 cm) is inseparable from the pancreatic tail and most resembles a pseudocyst. Changes track into the lesser sac and I favor these are the sequelae of pancreatitis although the pancreatic enhancement appears relatively normal. Main differential consideration is post splenectomy infection. 2. No other acute or inflammatory process identified in the abdomen or pelvis. 3. Destructive right sacral metastasis appears stable. 4. Mild pulmonary atelectasis. Electronically Signed   By: Genevie Ann M.D.   On: 04/18/2015 03:02   Assessment/Plan 1. Intra-abdominal abscess [facility]) Patient presents with complaints of fatigue and weight loss. He also has poor appetite. Workup shows that he has left-sided abdominal pain and CAT scan shows he has extensive inflammation with loculated fluid collection suggesting possible infection or abscess. General surgery was consulted and will be following up with the patient. Currently patient will be treated with vancomycin and Fortaz. We will give him IV hydration. Check ESR and CRP. Patient remains nothing by mouth except medications.  2.  Foot drop Pedal edema Check ultrasound Doppler to rule out DVT.  3 large cell lymphoma   Spine metastasis ([place]) Continue close monitoring.  Nutrition: Nothing by mouth except medications DVT Prophylaxis: subcutaneous Heparin  Advance goals of care discussion: Full code    Consults: Gen. surgery  Family Communication: family was present at bedside, opportunity was given to ask question and all questions were answered satisfactorily at the time of interview. Disposition: Admitted as inpatient, telemetry unit.  Author: Berle Mull, MD [facility] Pager: [phone] 04/18/2015  If 7PM-7AM, please contact night-coverage [email or web address] Password [email or web address]

## 2015-04-18 NOTE — Progress Notes (Signed)
ANTIBIOTIC CONSULT NOTE - INITIAL  Pharmacy Consult for Ceftazidime Indication: Sepsis  Allergies  Allergen Reactions  . Antihistamines, Diphenhydramine-Type     CAUSES PROSTATE TO SWELL & as a child had hyperactivity when took Benadryl Pt took Pepcid w/ Benadryl PO and did fine per Pt & his wife    Patient Measurements: Height: 6' (182.9 cm) Weight: 137 lb (62.143 kg) IBW/kg (Calculated) : 77.6 Adjusted Body Weight:   Vital Signs: Temp: 99.2 F (37.3 C) (10/13 0222) Temp Source: Oral (10/13 0222) BP: 111/61 mmHg (10/13 0222) Pulse Rate: 99 (10/13 0222) Intake/Output from previous day:   Intake/Output from this shift:    Labs:  Recent Labs  04/18/15 0025  WBC 11.9*  HGB 10.0*  PLT 239  CREATININE 0.78   Estimated Creatinine Clearance: 72.2 mL/min (by C-G formula based on Cr of 0.78). No results for input(s): VANCOTROUGH, VANCOPEAK, VANCORANDOM, GENTTROUGH, GENTPEAK, GENTRANDOM, TOBRATROUGH, TOBRAPEAK, TOBRARND, AMIKACINPEAK, AMIKACINTROU, AMIKACIN in the last 72 hours.   Microbiology: Recent Results (from the past 720 hour(s))  Culture, blood (routine x 2)     Status: None   Collection Time: 03/26/15 12:20 PM  Result Value Ref Range Status   Specimen Description BLOOD LEFT ARM  Final   Special Requests BOTTLES DRAWN AEROBIC AND ANAEROBIC 10CC  Final   Culture   Final    NO GROWTH 5 DAYS Performed at [facility]    Report Status 03/31/2015 FINAL  Final  Culture, blood (routine x 2)     Status: None   Collection Time: 03/26/15 12:25 PM  Result Value Ref Range Status   Specimen Description BLOOD RIGHT HAND  Final   Special Requests BOTTLES DRAWN AEROBIC AND ANAEROBIC 5CC  Final   Culture   Final    NO GROWTH 5 DAYS Performed at [facility]    Report Status 03/31/2015 FINAL  Final    Medical History: Past Medical History  Diagnosis Date  . 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 ALLERGIC RHINITIS 08/03/2007  . GERD 02/18/2007  . KNEE  PAIN, LEFT, CHRONIC 08/07/2008  . BACK PAIN 09/18/2009  . HAMMER TOE 08/03/2007  . CAROTID BRUIT, LEFT 08/07/2008  . 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 Foot drop     rt foot  . Rash, skin     torso  . Hepatitis 1977    no residual problems  . Sleeping difficulty   . Skin cancer   . Lymphoma ([place])     Medications:  Anti-infectives    Start     Dose/Rate Route Frequency Ordered Stop   04/18/15 1400  cefTAZidime (FORTAZ) 2 g in dextrose 5 % 50 mL IVPB     2 g 100 mL/hr over 30 Minutes Intravenous 3 times per day 04/18/15 0515     04/18/15 0230  cefTAZidime (FORTAZ) 2 g in dextrose 5 % 50 mL IVPB     2 g 100 mL/hr over 30 Minutes Intravenous  Once 04/18/15 0220 04/18/15 0500   04/18/15 0130  vancomycin (VANCOCIN) IVPB 1000 mg/200 mL premix     1,000 mg 200 mL/hr over 60 Minutes Intravenous  Once 04/18/15 0118 04/18/15 0500   04/18/15 0130  metroNIDAZOLE (FLAGYL) IVPB 500 mg     500 mg 100 mL/hr over 60 Minutes Intravenous  Once 04/18/15 0118 04/18/15 0340     Assessment: Patient with sepsis.  First dose of antibiotics already given in  ED.  Goal of Therapy:  Appropriate antibiotic dosing for renal function; eradication of infection   Plan:  Follow up culture results  Ceftazidime 2gm iv q8hr  Tyler Deis, Shea Stakes [place] 04/18/2015,5:15 AM

## 2015-04-18 NOTE — ED Notes (Signed)
Patient transported to X-ray 

## 2015-04-18 NOTE — Care Management Note (Signed)
Case Management Note  Patient Details  Name: Perry Nielsen. MRN: [id number] Date of Birth: 06/17/1941  Subjective/Objective: 74 y/o m admitted w/intra-abdominal abscess. Readmit 9/18-9/21-Acute encephalopathy. From home. Sx/IR cons.                  Action/Plan:d/c plan home.   Expected Discharge Date:                  Expected Discharge Plan:  Home/Self Care  In-House Referral:     Discharge planning Services  CM Consult  Post Acute Care Choice:    Choice offered to:     DME Arranged:    DME Agency:     HH Arranged:    HH Agency:     Status of Service:  In process, will continue to follow  Medicare Important Message Given:    Date Medicare IM Given:    Medicare IM give by:    Date Additional Medicare IM Given:    Additional Medicare Important Message give by:     If discussed at [place] of Stay Meetings, dates discussed:    Additional Comments:  [name], RN 04/18/2015, 1:20 PM

## 2015-04-18 NOTE — Progress Notes (Addendum)
ANTICOAGULATION CONSULT NOTE - Initial Consult  Pharmacy Consult for Lovenox Indication: DVT  Allergies  Allergen Reactions  . Antihistamines, Diphenhydramine-Type     CAUSES PROSTATE TO SWELL & as a child had hyperactivity when took Benadryl Pt took Pepcid w/ Benadryl PO and did fine per Pt & his wife    Patient Measurements: Height: 6' (182.9 cm) Weight: 138 lb 14.2 oz (63 kg) IBW/kg (Calculated) : 77.6   Vital Signs: Temp: 98.4 F (36.9 C) (10/13 1340) Temp Source: Oral (10/13 1340) BP: 107/51 mmHg (10/13 1340) Pulse Rate: 102 (10/13 1340)  Labs:  Recent Labs  04/18/15 0025 04/18/15 0735  HGB 10.0* 10.0*  HCT 29.6* 29.5*  PLT 239 270  LABPROT 15.9* 16.7*  INR 1.26 1.34  CREATININE 0.78 0.59*    Estimated Creatinine Clearance: 73.3 mL/min (by C-G formula based on Cr of 0.59).   Medical History: Past Medical History  Diagnosis Date  . 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 ALLERGIC RHINITIS 08/03/2007  . GERD 02/18/2007  . KNEE PAIN, LEFT, CHRONIC 08/07/2008  . BACK PAIN 09/18/2009  . HAMMER TOE 08/03/2007  . CAROTID BRUIT, LEFT 08/07/2008  . 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 Foot drop     rt foot  . Rash, skin     torso  . Hepatitis 1977    no residual problems  . Sleeping difficulty   . Skin cancer   . Lymphoma ([place])     Medications:  Scheduled:  . antiseptic oral rinse  7 mL Mouth Rinse q12n4p  . cefTAZidime (FORTAZ)  IV  2 g Intravenous 3 times per day  . chlorhexidine  15 mL Mouth Rinse BID  . famotidine  20 mg Oral Daily  . sodium chloride  3 mL Intravenous Q12H  . traZODone  100 mg Oral QHS  . vancomycin  750 mg Intravenous Q8H   Infusions:  . sodium chloride     PRN: acetaminophen **OR** acetaminophen, fluticasone, LORazepam, ondansetron **OR** ondansetron (ZOFRAN) IV, oxymetazoline  Assessment: Pt known to pharmacy due to Vanco and Ceftazidime therapy. Pt is to be started  on Lovenox for DVT.  Goal of Therapy:  Anti-Xa level 0.6-1 units/ml 4hrs after LMWH dose given  Monitor platelet by anticoagulation protocol: Yes  Plan:  Lovenox 65mg  Sq x 1 for now as MD wants to hold am dose of Lovenox for an IR procedure in am. Will give Lovenox at 2000 since pt received 40mg  SQ @1100 . Will f/u when to resume Lovenox s/p IR procedure. Will check Q72h Scr while on Lovenox.   Garnet Sierras 04/18/2015,5:28 PM

## 2015-04-18 NOTE — Progress Notes (Signed)
Preliminary results by tech - Venous Duplex Lower Ext. Completed. Positive for acute deep vein thrombosis involving the right common femoral, femoral vein, popliteal vein and calf veins and the left peroneal veins.Results given to patient's nurse. Oda Cogan, BS, RDMS, RVT

## 2015-04-18 NOTE — ED Provider Notes (Signed)
CSN: [id number]     Arrival date & time 04/17/15  2245 History  By signing my name below, I, Hilda Lias, attest that this documentation has been prepared under the direction and in the presence of Varney Biles, MD. Electronically Signed: Hilda Lias, ED Scribe. 04/18/2015. 1:45 AM.    Chief Complaint  Patient presents with  . Failure To Thrive     The history is provided by the patient. No language interpreter was used.   HPI Comments: Perry Nielsen. is a 74 y.o. male who presents to the Emergency Department complaining of constant weakness with associated fever, decreased appetite and diarrhea that has been present for around two days. Pt wife states that the reason they came in today was because he had a subjective fever after he ate dinner, and then a fever of 101.5 a couple of hours later. His wife states he had diarrhea a few times this morning. Pt states he had chemotherapy two weeks ago and is scheduled to have his third overall dose of chemotherapy in one week. Pt denies headache, vomiting, dysuria, hematuria, rash, neck pain or stiffness, chest pain, SOB, cough. Pt denies using any antibiotics in the past couple of months. His last hospitalization was a few weeks ago for confusion. Pt denies positive sick contact.      Past Medical History  Diagnosis Date  . 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 ALLERGIC RHINITIS 08/03/2007  . GERD 02/18/2007  . KNEE PAIN, LEFT, CHRONIC 08/07/2008  . BACK PAIN 09/18/2009  . HAMMER TOE 08/03/2007  . CAROTID BRUIT, LEFT 08/07/2008  . 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 Foot drop     rt foot  . Rash, skin     torso  . Hepatitis 1977    no residual problems  . Sleeping difficulty   . Skin cancer   . Lymphoma [facility])    Past Surgical History  Procedure Laterality Date  . Shoulder open rotator cuff repair  07/06/05    right  . Knee arthroscopy      left  . Tonsillectomy and  adenoidectomy    . Transurethral resection of prostate    . Cataract extraction w/ intraocular lens  implant, bilateral    . 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 Heart attack Other   . Aneurysm Other     aortic  . Coronary artery disease Sister   . Cancer Father     prostate  . Pulmonary fibrosis Mother   . Uterine cancer Mother    Social History  Substance Use Topics  . Smoking status: Never Smoker   . Smokeless tobacco: None  . Alcohol Use: 0.0 oz/week    0 Standard drinks or equivalent per week     Comment: 2-3 times a week. Beer.     Review of Systems  Constitutional: Positive for fever and appetite change.  Respiratory: Negative for shortness of breath.   Cardiovascular: Negative for chest pain.  Gastrointestinal: Positive for diarrhea. Negative for vomiting and abdominal pain.  Genitourinary: Negative for dysuria, hematuria and difficulty urinating.  Musculoskeletal: Negative for neck pain and neck stiffness.  Skin: Negative for rash.  Neurological: Positive for weakness.      Allergies  Antihistamines, diphenhydramine-type  Home Medications   Prior to Admission medications   Medication Sig Start Date End Date Taking? Authorizing Provider  famotidine (PEPCID) 20 MG tablet Take 20 mg by mouth daily.    Yes Historical Provider, MD  ibuprofen (ADVIL,MOTRIN) 200 MG tablet Take 400 mg by mouth every 4 (four) hours as needed for fever, headache, mild pain, moderate pain or cramping.   Yes Historical Provider, MD  loperamide (IMODIUM A-D) 2 MG tablet Take 2 mg by mouth 4 (four) times daily as needed for diarrhea or loose stools.   Yes Historical Provider, MD  loratadine (CLARITIN) 10 MG tablet Take 10 mg by mouth daily.   Yes Historical Provider, MD  LORazepam (ATIVAN) 0.5 MG tablet Take 1  tablet (0.5 mg total) by mouth 2 (two) times daily as needed for anxiety. 04/16/15  Yes Dorena Cookey, MD  oxymetazoline (AFRIN) 0.05 % nasal spray Place 1 spray into both nostrils 2 (two) times daily as needed for congestion.   Yes Historical Provider, MD  potassium chloride SA (K-DUR,KLOR-CON) 20 MEQ tablet Take 1 tablet (20 mEq total) by mouth 2 (two) times daily. 03/29/15  Yes Ladell Pier, MD  traZODone (DESYREL) 100 MG tablet Take 1 tablet (100 mg total) by mouth at bedtime. 03/27/15  Yes Annita Brod, MD  amoxicillin-clavulanate (AUGMENTIN) 875-125 MG tablet Take 1 tablet by mouth 2 (two) times daily. 04/23/15   Kelvin Cellar, MD  enoxaparin (LOVENOX) 100 MG/ML injection Inject 1 mL (100 mg total) into the skin daily. 04/23/15   Kelvin Cellar, MD  feeding supplement, ENSURE ENLIVE, (ENSURE ENLIVE) LIQD Take 237 mLs by mouth 2 (two) times daily between meals. 03/27/15   Annita Brod, MD  fluticasone (FLONASE) 50 MCG/ACT nasal spray Place 1 spray into both nostrils daily as needed for allergies.     Historical Provider, MD  HYDROmorphone (DILAUDID) 2 MG tablet Take 1 tablet (2 mg total) by mouth every 4 (four) hours as needed for severe pain. Patient not taking: Reported on 04/01/2015 03/27/15   Annita Brod, MD  megestrol (MEGACE ORAL) 40 MG/ML suspension Take 5 mLs (200 mg total) by mouth daily. 04/26/15   Owens Shark, NP  prochlorperazine (COMPAZINE) 5 MG tablet Take 1-2 tablets (5-10 mg total) by mouth every 6 (six) hours as needed for nausea or vomiting. Patient not taking: Reported on 04/01/2015 03/14/15   Owens Shark, NP   BP 103/65 mmHg  Pulse 85  Temp(Src) 98.1 F (36.7 C) (Oral)  Resp 18  Ht 6' (1.829 m)  Wt 146 lb 2.6 oz (66.3 kg)  BMI 19.82 kg/m2  SpO2 97% Physical Exam  HENT:  Mouth/Throat: Oropharynx is clear and moist. No oropharyngeal exudate.  No tonsillar exudates   Cardiovascular: Normal rate and regular rhythm.   Pulmonary/Chest: Effort normal  and breath sounds normal. No respiratory distress. He has no wheezes. He has no rales. He exhibits no tenderness.  Lungs are clear  Abdominal: Soft. Bowel sounds are normal. There is tenderness.  Mild tenderness on the left side of his abdomen    Musculoskeletal:  Right lower extremity has unilateral swelling without any calf tenderness No midline spine tenderness  Lymphadenopathy:    He has no cervical adenopathy.    ED Course  Procedures (including critical care time)  DIAGNOSTIC STUDIES: Oxygen Saturation is 99% on room air, normal by my interpretation.    COORDINATION OF CARE: 12:22 AM Discussed treatment plan with pt at bedside and pt agreed to  plan.   Labs Review Labs Reviewed  COMPREHENSIVE METABOLIC PANEL - Abnormal; Notable for the following:    Sodium 134 (*)    Chloride 100 (*)    Glucose, Bld 104 (*)    Calcium 8.5 (*)    Total Protein 5.5 (*)    Albumin 2.5 (*)    All other components within normal limits  CBC WITH DIFFERENTIAL/PLATELET - Abnormal; Notable for the following:    WBC 11.9 (*)    RBC 3.32 (*)    Hemoglobin 10.0 (*)    HCT 29.6 (*)    RDW 16.2 (*)    Neutro Abs 10.0 (*)    Lymphs Abs 0.5 (*)    Monocytes Absolute 1.3 (*)    All other components within normal limits  URINALYSIS, ROUTINE W REFLEX MICROSCOPIC (NOT AT [facility]) - Abnormal; Notable for the following:    Specific Gravity, Urine 1.045 (*)    All other components within normal limits  PROTIME-INR - Abnormal; Notable for the following:    Prothrombin Time 15.9 (*)    All other components within normal limits  CBC WITH DIFFERENTIAL/PLATELET - Abnormal; Notable for the following:    WBC 13.3 (*)    RBC 3.32 (*)    Hemoglobin 10.0 (*)    HCT 29.5 (*)    RDW 16.3 (*)    Neutro Abs 11.6 (*)    All other components within normal limits  PROTIME-INR - Abnormal; Notable for the following:    Prothrombin Time 16.7 (*)    All other components within normal limits  COMPREHENSIVE METABOLIC  PANEL - Abnormal; Notable for the following:    Sodium 134 (*)    Potassium 3.4 (*)    Creatinine, Ser 0.59 (*)    Calcium 7.8 (*)    Total Protein 5.2 (*)    Albumin 2.3 (*)    All other components within normal limits  SEDIMENTATION RATE - Abnormal; Notable for the following:    Sed Rate 85 (*)    All other components within normal limits  C-REACTIVE PROTEIN - Abnormal; Notable for the following:    CRP 19.6 (*)    All other components within normal limits  AMYLASE - Abnormal; Notable for the following:    Amylase 167 (*)    All other components within normal limits  LIPASE, BLOOD - Abnormal; Notable for the following:    Lipase 60 (*)    All other components within normal limits  CBC - Abnormal; Notable for the following:    WBC 16.5 (*)    RBC 3.13 (*)    Hemoglobin 9.2 (*)    HCT 27.7 (*)    RDW 16.2 (*)    All other components within normal limits  COMPREHENSIVE METABOLIC PANEL - Abnormal; Notable for the following:    Sodium 133 (*)    Potassium 3.1 (*)    Calcium 7.8 (*)    Total Protein 4.8 (*)    Albumin 2.1 (*)    All other components within normal limits  BASIC METABOLIC PANEL - Abnormal; Notable for the following:    Sodium 134 (*)    Potassium 3.2 (*)    BUN 5 (*)    Calcium 7.7 (*)    All other components within normal limits  CBC - Abnormal; Notable for the following:    WBC 17.7 (*)    RBC 3.06 (*)    Hemoglobin 8.9 (*)    HCT 27.0 (*)    RDW  16.3 (*)    All other components within normal limits  BASIC METABOLIC PANEL - Abnormal; Notable for the following:    BUN <5 (*)    Creatinine, Ser 0.54 (*)    Calcium 8.3 (*)    All other components within normal limits  CBC - Abnormal; Notable for the following:    WBC 16.5 (*)    RBC 3.27 (*)    Hemoglobin 9.5 (*)    HCT 29.1 (*)    RDW 16.3 (*)    All other components within normal limits  CBC - Abnormal; Notable for the following:    WBC 15.2 (*)    RBC 3.26 (*)    Hemoglobin 9.7 (*)    HCT  29.4 (*)    RDW 16.7 (*)    All other components within normal limits  COMPREHENSIVE METABOLIC PANEL - Abnormal; Notable for the following:    BUN <5 (*)    Creatinine, Ser 0.60 (*)    Calcium 8.1 (*)    Total Protein 5.0 (*)    Albumin 2.2 (*)    All other components within normal limits  CBC - Abnormal; Notable for the following:    WBC 11.0 (*)    RBC 3.31 (*)    Hemoglobin 9.6 (*)    HCT 29.5 (*)    RDW 16.7 (*)    Platelets 403 (*)    All other components within normal limits  CULTURE, BLOOD (ROUTINE X 2)  CULTURE, BLOOD (ROUTINE X 2)  URINE CULTURE  C DIFFICILE QUICK SCREEN W PCR REFLEX  CULTURE, ROUTINE-ABSCESS  ANAEROBIC CULTURE  CULTURE, BLOOD (ROUTINE X 2)  CULTURE, BLOOD (ROUTINE X 2)  VANCOMYCIN, TROUGH  AMYLASE, BODY FLUID  LIPASE, FLUID  URINALYSIS, ROUTINE W REFLEX MICROSCOPIC (NOT AT [facility])  VANCOMYCIN, TROUGH  VANCOMYCIN, TROUGH  I-STAT CG4 LACTIC ACID, ED  I-STAT CG4 LACTIC ACID, ED    Imaging Review No results found. I have personally reviewed and evaluated these images and lab results as part of my medical decision-making.   EKG Interpretation   Date/Time:  Thursday April 18 2015 00:38:37 EDT Ventricular Rate:  102 PR Interval:  149 QRS Duration: 89 QT Interval:  336 QTC Calculation: 438 R Axis:   12 Text Interpretation:  Sinus tachycardia Low voltage, extremity leads No  significant change since last tracing Confirmed by Aslynn Brunetti, MD, Thelma Comp  [phone]) on 04/18/2015 3:00:34 AM      MDM   Final diagnoses:  Streptococcal sepsis ([place])  Acute colitis  Postoperative complication involving spleen, unspecified complication  Immunosuppressed status ([place])    I personally performed the services described in this documentation, which was scribed in my presence. The recorded information has been reviewed and is accurate.   CRITICAL CARE Performed by: Varney Biles   Total critical care time: 50 minutes  Critical care time was  exclusive of separately billable procedures and treating other patients.  Critical care was necessary to treat or prevent imminent or life-threatening deterioration.  Critical care was time spent personally by me on the following activities: development of treatment plan with patient and/or surrogate as well as nursing, discussions with consultants, evaluation of patient's response to treatment, examination of patient, obtaining history from patient or surrogate, ordering and performing treatments and interventions, ordering and review of laboratory studies, ordering and review of radiographic studies, pulse oximetry and re-evaluation of patient's condition.    Immunosuppressed patient comes in with weakness, has a low grade temp orally, but a true  fever rectally. Ct scan done, as he had a recent splenectomy and we have concerns for sepsis. CT scan is +. Pt has a shock index > 1, but the lactate is normal. He will need ivab given the CT scan findings that are concerning for abscess. Surgery informed. Medicine to admit.   Varney Biles, MD 04/27/15 [phone]

## 2015-04-18 NOTE — Progress Notes (Signed)
[facility] PROGRESS NOTE  Perry Nielsen. NGE:[id number] DOB: 08/08/1940 DOA: 04/17/2015 PCP: Joycelyn Man, MD  Assessment/Plan: 1. 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 -There was concern that this could reflect post splenectomy infectious process. -Gen. surgery was consulted recommending interventional radiology consultation -Continue empiric IV antimicrobial therapy with vancomycin and Tressie Ellis -Will follow-up on cultures.  2.  Large B-cell Lymphoma  -He is currently being followed at the cancer center by Dr. Benay Spice, undergoing treatment with CHOP/Rituxan -PET scan performed 02/22/2015 showing numerous hypermetabolic bone lesions throughout the axial and appendicular skeleton as well as hypermetabolic splenic lesion. -Radiology reporting that destructive right sacral metastasis appearing stable -Dr Benay Spice notified.   3.  Right foot drop -Likely secondary to nerve compression from right sacral mass  Code Status: Full code Family Communication: Family not present Disposition Plan:    Consultants:  General surgery  HPI/Subjective: Perry Nielsen is a pleasant 74 year old with a past medical history of large B-cell lymphoma, CD20 positive with previous imaging revealing sacral mass, undergoing splenectomy on 02/23/2015, status post therapy with CHOP/rituximab admitted to the medicine service on 04/18/2015 presenting with complaints of fever, generalized weakness and fatigue. He was found have a temperature 101.9. He was further worked up with a CT scan of abdomen and pelvis that revealed loculated fluid collections in the left upper quadrant largest measuring  6.7 cm. There is concern for this reflecting postsplenectomy infection. He was started on empiric IV antimicrobial therapy with ceftazidime and vancomycin. General surgery was consulted, recommending interventional radiology consultation for possible drain placement.  Objective: Filed Vitals:   04/18/15 0615  BP: 118/85  Pulse: 98  Temp: 99.2 F (37.3 C)  Resp:    No intake or output data in the 24 hours ending 04/18/15 1318 Filed Weights   04/17/15 2254 04/18/15 0031 04/18/15 0615  Weight: 62.143 kg (137 lb) 62.143 kg (137 lb) 63 kg (138 lb 14.2 oz)    Exam:   General:  Patient is awake and alert, no acute distress he reports having mild abdominal pain  Cardiovascular: Regular rate and rhythm normal S1-S2 no murmurs rubs or gallops  Respiratory: Normal respiratory effort, lungs are clear to auscultation bilaterally  Abdomen: Patient having pain with deep palpation over the left upper quadrant region  Musculoskeletal: No extremity edema, sarcopenia present  Data Reviewed: Basic Metabolic Panel:  Recent Labs Lab 04/18/15 0025 04/18/15 0735  NA 134* 134*  K 3.5 3.4*  CL 100* 103  CO2 26 23  GLUCOSE 104* 98  BUN 9 6  CREATININE 0.78 0.59*  CALCIUM 8.5* 7.8*   Liver Function Tests:  Recent Labs Lab 04/18/15 0025 04/18/15 0735  AST 24 24  ALT 37 34  ALKPHOS 76 74  BILITOT 0.7 0.6  PROT 5.5* 5.2*  ALBUMIN 2.5* 2.3*    Recent Labs Lab 04/18/15 0735  LIPASE 60*  AMYLASE 167*   No results for input(s): AMMONIA in the last 168 hours. CBC:  Recent Labs Lab 04/18/15 0025 04/18/15 0735  WBC 11.9* 13.3*  NEUTROABS 10.0* 11.6*  HGB 10.0* 10.0*  HCT 29.6* 29.5*  MCV 89.2 88.9  PLT 239 270   Cardiac Enzymes: No results for input(s): CKTOTAL, CKMB, CKMBINDEX, TROPONINI in the last  168 hours. BNP (last 3 results) No results for input(s): BNP in the last 8760 hours.  ProBNP (last 3 results) No results for input(s): PROBNP in the last 8760  hours.  CBG: No results for input(s): GLUCAP in the last 168 hours.  No results found for this or any previous visit (from the past 240 hour(s)).   Studies: Dg Chest 2 View  04/18/2015  CLINICAL DATA:  74 year old male with fever and diarrhea for 48 hours. Cancer patient with metastatic disease unknown primary currently undergoing chemotherapy. Subsequent encounter. EXAM: CHEST  2 VIEW COMPARISON:  03/25/2015 and earlier FINDINGS: Semi upright AP and lateral views of the chest. Stable lung volumes. Stable cardiac size and mediastinal contours. No pneumothorax or pulmonary edema. No pleural effusion or acute pulmonary opacity identified. Severe mid thoracic pathologic compression fracture has progressed since August. Elsewhere stable visualized osseous structures. IMPRESSION: No acute cardiopulmonary abnormality. Electronically Signed   By: Genevie Ann M.D.   On: 04/18/2015 01:06   Ct Abdomen Pelvis W Contrast  04/18/2015  CLINICAL DATA:  74 year old male with abdominal pain weakness and fever. Undergoing chemotherapy. 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 Non-Hodgkin's lymphoma. Subsequent encounter. EXAM: CT ABDOMEN AND PELVIS WITH CONTRAST TECHNIQUE: Multidetector CT imaging of the abdomen and pelvis was performed using the standard protocol following bolus administration of intravenous contrast. CONTRAST:  192mL OMNIPAQUE IOHEXOL 300 MG/ML  SOLN COMPARISON:  PET-CT 02/22/2015.  CT Abdomen and Pelvis 02/06/2015. FINDINGS: Lower lung volumes. Increased dependent opacity in both lungs most resembles atelectasis. No pericardial or pleural effusion. Destructive right sacral metastasis. Stable visualized osseous structures. Fluid in the distal colon. No pelvic free fluid. Unremarkable urinary bladder. Fluid in the sigmoid colon and descending colon. Abnormal left upper quadrant and splenic flexure with thickened and inflamed colon surrounded by extensive mesenteric stranding and loculated fluid collections.  The largest collection is 6.7 cm inseparable from the tail of the pancreas. Other loculations of fluid also track into the lesser sac. There is associated inflammatory stranding. However, the pancreatic parenchyma still appears fairly distinct. There is secondary involvement of some small bowel in the left upper quadrant. Proximal to these changes there is gaseous distension of the transverse colon and proximal splenic flexure. There is fluid in the right colon. Fluid-filled nondilated distal small bowel. Much of the proximal small bowel is decompressed. The inflammatory stranding also involves the residual gastrosplenic ligament and proximal stomach with associated wall thickening. The distal stomach and duodenum are spared. However, the proximal jejunum is secondarily affected. No abdominal free fluid or free air. Liver, gallbladder, adrenal glands, and kidneys are stable. Portal venous system is patent. Major arterial structures are patent. Stable mild infrarenal abdominal aortic aneurysm measuring 3.1 cm diameter. Soft and calcified aortic plaque. No lymphadenopathy in the abdomen or pelvis. IMPRESSION: 1. Status post splenectomy since the prior CTs. Extensive inflammation in the left upper quadrant affecting in the splenic flexure of colon, the proximal small bowel, and the proximal stomach. Associated loculated fluid collections, the largest of which (6.7 cm) is inseparable from the pancreatic tail and most resembles a pseudocyst. Changes track into the lesser sac and I favor these are the sequelae of pancreatitis although the pancreatic enhancement appears relatively normal. Main differential consideration is post splenectomy infection. 2. No other acute or inflammatory process identified in the abdomen or pelvis. 3. Destructive right sacral metastasis appears stable. 4. Mild pulmonary atelectasis. Electronically Signed   By: Genevie Ann M.D.   On: 04/18/2015 03:02  Scheduled Meds: . antiseptic oral rinse   7 mL Mouth Rinse q12n4p  . cefTAZidime (FORTAZ)  IV  2 g Intravenous 3 times per day  . chlorhexidine  15 mL Mouth Rinse BID  . enoxaparin (LOVENOX) injection  40 mg Subcutaneous Q24H  . famotidine  20 mg Oral Daily  . sodium chloride  3 mL Intravenous Q12H  . traZODone  100 mg Oral QHS   Continuous Infusions: . sodium chloride      Principal Problem:   Intra-abdominal abscess ([facility]) Active Problems:   GERD   Foot drop   Spine metastasis ([place])   Lymphoma, large cell ([place])   Sepsis ([place])    Time spent:     Kelvin Cellar  [facility] Pager [phone]. If 7PM-7AM, please contact night-coverage at [email or web address], password [facility] 04/18/2015, 1:18 PM  LOS: 0 days

## 2015-04-18 NOTE — Progress Notes (Signed)
Patient ID: Perry Iha., male   DOB: 1940/07/31, 74 y.o.   MRN: [id number]    Referring Physician(s): CCS  Chief Complaint:  Abdominal fluid collection  Subjective: Patient familiar to IR service from prior bone marrow and right sacral mass biopsies, most recently on 02/19/15. 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 He is currently receiving chemotherapy. He was admitted to the hospital today with 2-3 week history of weight loss, anorexia, nausea, diarrhea and abdominal pain. He has also had recent fevers. CT scan of abdomen and pelvis revealed extensive inflammation in the left upper quadrant affecting the splenic flexure of colon, proximal small bowel and proximal stomach. There were associated loculated fluid collections, largest of which is inseparable from the pancreatic tail and may represent a pseudocyst. Patient has known destructive right sacral metastasis as well. Patient was assessed by surgery and request now received for CT guided aspiration/possible drainage of left upper quadrant fluid collection/? Pseudocyst. Of note patient also has acute right lower extremity DVT diagnosed by Doppler study today involving right common femoral, femoral ,popliteal and calf veins.   Allergies: Antihistamines, diphenhydramine-type  Medications: Prior to Admission medications   Medication Sig Start Date End Date Taking? Authorizing Provider  famotidine (PEPCID) 20 MG tablet Take 20 mg by mouth daily.    Yes Historical Provider, MD  ibuprofen (ADVIL,MOTRIN) 200 MG tablet Take 400 mg by mouth every 4 (four) hours as needed for fever, headache, mild pain, moderate pain or cramping.   Yes Historical Provider, MD  loperamide (IMODIUM A-D) 2 MG tablet Take 2 mg by mouth 4 (four) times daily as needed for diarrhea or loose stools.   Yes Historical Provider, MD  loratadine (CLARITIN) 10 MG tablet Take 10 mg by mouth  daily.   Yes Historical Provider, MD  LORazepam (ATIVAN) 0.5 MG tablet Take 1 tablet (0.5 mg total) by mouth 2 (two) times daily as needed for anxiety. 04/16/15  Yes Dorena Cookey, MD  oxymetazoline (AFRIN) 0.05 % nasal spray Place 1 spray into both nostrils 2 (two) times daily as needed for congestion.   Yes Historical Provider, MD  potassium chloride SA (K-DUR,KLOR-CON) 20 MEQ tablet Take 1 tablet (20 mEq total) by mouth 2 (two) times daily. 03/29/15  Yes Ladell Pier, MD  traZODone (DESYREL) 100 MG tablet Take 1 tablet (100 mg total) by mouth at bedtime. 03/27/15  Yes Annita Brod, MD  feeding supplement, ENSURE ENLIVE, (ENSURE ENLIVE) LIQD Take 237 mLs by mouth 2 (two) times daily between meals. Patient not taking: Reported on 04/01/2015 03/27/15   Annita Brod, MD  fluticasone [facility]) 50 MCG/ACT nasal spray Place 1 spray into both nostrils daily as needed for allergies.     Historical Provider, MD  HYDROmorphone (DILAUDID) 2 MG tablet Take 1 tablet (2 mg total) by mouth every 4 (four) hours as needed for severe pain. Patient not taking: Reported on 04/01/2015 03/27/15   Annita Brod, MD  [place]    Historical Provider, MD  prochlorperazine (COMPAZINE) 5 MG tablet Take 1-2 tablets (5-10 mg total) by mouth every 6 (six) hours as needed for nausea or vomiting. Patient not taking: Reported on 04/01/2015 03/14/15   Owens Shark, NP  sorbitol 70 % solution Take 30 mLs by mouth 2 (two) times daily. Patient not taking: Reported on 04/01/2015 02/14/15   Ladell Pier, MD     Vital Signs:  BP 107/51 mmHg  Pulse 102  Temp(Src) 98.4 F (36.9 C) (Oral)  Resp 20  Ht 6' (1.829 m)  Wt 138 lb 14.2 oz (63 kg)  BMI 18.83 kg/m2  SpO2 98%  Physical Exam pt awake, alert. Chest clear to auscultation bilaterally; heart - sl tachy but regular rhythm. Abdomen soft, bowel sounds, mild epigastric tenderness to palpation, retained Dermabond noted on upper scalp incision  sites, nondistended; right ankle in splint- hx right foot drop/  RLE edema  Imaging: Dg Chest 2 View  04/18/2015  CLINICAL DATA:  74 year old male with fever and diarrhea for 48 hours. Cancer patient with metastatic disease unknown primary currently undergoing chemotherapy. Subsequent encounter. EXAM: CHEST  2 VIEW COMPARISON:  03/25/2015 and earlier FINDINGS: Semi upright AP and lateral views of the chest. Stable lung volumes. Stable cardiac size and mediastinal contours. No pneumothorax or pulmonary edema. No pleural effusion or acute pulmonary opacity identified. Severe mid thoracic pathologic compression fracture has progressed since August. Elsewhere stable visualized osseous structures. IMPRESSION: No acute cardiopulmonary abnormality. Electronically Signed   By: Genevie Ann M.D.   On: 04/18/2015 01:06   Ct Abdomen Pelvis W Contrast  04/18/2015  CLINICAL DATA:  74 year old male with abdominal pain weakness and fever. Undergoing chemotherapy. 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 Non-Hodgkin's lymphoma. Subsequent encounter. EXAM: CT ABDOMEN AND PELVIS WITH CONTRAST TECHNIQUE: Multidetector CT imaging of the abdomen and pelvis was performed using the standard protocol following bolus administration of intravenous contrast. CONTRAST:  149mL OMNIPAQUE IOHEXOL 300 MG/ML  SOLN COMPARISON:  PET-CT 02/22/2015.  CT Abdomen and Pelvis 02/06/2015. FINDINGS: Lower lung volumes. Increased dependent opacity in both lungs most resembles atelectasis. No pericardial or pleural effusion. Destructive right sacral metastasis. Stable visualized osseous structures. Fluid in the distal colon. No pelvic free fluid. Unremarkable urinary bladder. Fluid in the sigmoid colon and descending colon. Abnormal left upper quadrant and splenic flexure with thickened and inflamed colon surrounded by extensive mesenteric stranding and loculated fluid collections. The largest collection is 6.7 cm inseparable from the tail of the pancreas.  Other loculations of fluid also track into the lesser sac. There is associated inflammatory stranding. However, the pancreatic parenchyma still appears fairly distinct. There is secondary involvement of some small bowel in the left upper quadrant. Proximal to these changes there is gaseous distension of the transverse colon and proximal splenic flexure. There is fluid in the right colon. Fluid-filled nondilated distal small bowel. Much of the proximal small bowel is decompressed. The inflammatory stranding also involves the residual gastrosplenic ligament and proximal stomach with associated wall thickening. The distal stomach and duodenum are spared. However, the proximal jejunum is secondarily affected. No abdominal free fluid or free air. Liver, gallbladder, adrenal glands, and kidneys are stable. Portal venous system is patent. Major arterial structures are patent. Stable mild infrarenal abdominal aortic aneurysm measuring 3.1 cm diameter. Soft and calcified aortic plaque. No lymphadenopathy in the abdomen or pelvis. IMPRESSION: 1. Status post splenectomy since the prior CTs. Extensive inflammation in the left upper quadrant affecting in the splenic flexure of colon, the proximal small bowel, and the proximal stomach. Associated loculated fluid collections, the largest of which (6.7 cm) is inseparable from the pancreatic tail and most resembles a pseudocyst. Changes track into the lesser sac and I favor these are the sequelae of pancreatitis although the pancreatic enhancement appears relatively normal. Main differential consideration is post splenectomy infection. 2. No other acute or inflammatory process identified in the abdomen or pelvis. 3. Destructive right sacral  metastasis appears stable. 4. Mild pulmonary atelectasis. Electronically Signed   By: Genevie Ann M.D.   On: 04/18/2015 03:02    Labs:  CBC:  Recent Labs  03/29/15 1257 04/05/15 0851 04/18/15 0025 04/18/15 0735  WBC 12.5* 10.8* 11.9*  13.3*  HGB 12.9* 13.0 10.0* 10.0*  HCT 40.2 40.2 29.6* 29.5*  PLT 194 420* 239 270    COAGS:  Recent Labs  02/12/15 0728 02/19/15 1030 04/18/15 0025 04/18/15 0735  INR 1.09 1.02 1.26 1.34  APTT 25 25  --   --     BMP:  Recent Labs  03/25/15 0138 03/26/15 1020 03/29/15 1257 04/05/15 0851 04/18/15 0025 04/18/15 0735  NA 137 138 140 137 134* 134*  K 3.4* 3.4* 3.2* 4.3 3.5 3.4*  CL 103 104  --   --  100* 103  CO2 26 26 25  19* 26 23  GLUCOSE 112* 104* 104 120 104* 98  BUN 9 <5* 10.9 9.3 9 6   CALCIUM 8.3* 8.1* 8.3* 8.8 8.5* 7.8*  CREATININE 0.73 0.74 0.9 0.8 0.78 0.59*  GFRNONAA >60 >60  --   --  >60 >60  GFRAA >60 >60  --   --  >60 >60    LIVER FUNCTION TESTS:  Recent Labs  03/29/15 1257 04/05/15 0851 04/18/15 0025 04/18/15 0735  BILITOT 0.38 0.52 0.7 0.6  AST 15 20 24 24   ALT 18 25 37 34  ALKPHOS 90 75 76 74  PROT 5.6* 5.8* 5.5* 5.2*  ALBUMIN 2.7* 2.6* 2.5* 2.3*    Assessment and Plan: Patient with recently diagnosed large B cell non-Hodgkin's lymphoma, laparoscopic splenectomy on 03/05/15, admitted now with abdominal pain, nausea, diarrhea, recent fever, anorexia and weight loss.  CT scan of abdomen and pelvis reveals extensive inflammatory changes in the left upper quadrant affecting the splenic fracture of colon, proximal small bowel or proximal stomach. A standard loculated fluid collections largest of which is 6.7 cm and inseparable from the pancreatic tail concerning for  pseudocyst. Serum amylase 167, lipase 60, WBC 13.3, hemoglobin 10, platelets 270K, PT 16.7/INR 1.34. Patient also noted to have acute extensive right lower extremity DVT. Request received from surgery for CT-guided aspiration/possible drainage of left upper abdominal fluid collection/pseudocyst. Imaging studies were reviewed by Dr. Pascal Lux. At this time plan is for aspiration only of the suspected pseudocyst with fluid sent for amylase, lipase and culture. Details/risks of procedure,  including but not limited to, internal bleeding, infection, injury to adjacent organs, discussed with patient and wife with their understanding and consent. Plans also discussed with Dr. Harlow Asa. Case tentatively scheduled for 10/14 pm.Check c diff.   Signed: D. Rowe Robert 04/18/2015, 5:06 PM   I spent a total of 25 minutes at the the patient's bedside AND on the patient's hospital floor or unit, greater than 50% of which was counseling/coordinating care for CT guided aspiration of left upper abd fluid collection

## 2015-04-19 ENCOUNTER — Inpatient Hospital Stay (HOSPITAL_COMMUNITY): Payer: Medicare Other

## 2015-04-19 ENCOUNTER — Ambulatory Visit: Payer: Self-pay | Admitting: Nurse Practitioner

## 2015-04-19 ENCOUNTER — Other Ambulatory Visit: Payer: Self-pay

## 2015-04-19 DIAGNOSIS — R109 Unspecified abdominal pain: Secondary | ICD-10-CM

## 2015-04-19 DIAGNOSIS — R188 Other ascites: Secondary | ICD-10-CM | POA: Insufficient documentation

## 2015-04-19 DIAGNOSIS — I82401 Acute embolism and thrombosis of unspecified deep veins of right lower extremity: Secondary | ICD-10-CM

## 2015-04-19 DIAGNOSIS — C8519 Unspecified B-cell lymphoma, extranodal and solid organ sites: Secondary | ICD-10-CM

## 2015-04-19 DIAGNOSIS — I82409 Acute embolism and thrombosis of unspecified deep veins of unspecified lower extremity: Secondary | ICD-10-CM | POA: Diagnosis present

## 2015-04-19 DIAGNOSIS — K651 Peritoneal abscess: Secondary | ICD-10-CM

## 2015-04-19 DIAGNOSIS — R4182 Altered mental status, unspecified: Secondary | ICD-10-CM

## 2015-04-19 DIAGNOSIS — C8580 Other specified types of non-Hodgkin lymphoma, unspecified site: Secondary | ICD-10-CM

## 2015-04-19 LAB — COMPREHENSIVE METABOLIC PANEL
ALT: 27 U/L (ref 17–63)
AST: 22 U/L (ref 15–41)
Albumin: 2.1 g/dL — ABNORMAL LOW (ref 3.5–5.0)
Alkaline Phosphatase: 70 U/L (ref 38–126)
Anion gap: 6 (ref 5–15)
BUN: 6 mg/dL (ref 6–20)
CHLORIDE: 103 mmol/L (ref 101–111)
CO2: 24 mmol/L (ref 22–32)
CREATININE: 0.63 mg/dL (ref 0.61–1.24)
Calcium: 7.8 mg/dL — ABNORMAL LOW (ref 8.9–10.3)
GFR calc Af Amer: >60 mL/min (ref >60)
Glucose, Bld: 93 mg/dL (ref 65–99)
POTASSIUM: 3.1 mmol/L — AB (ref 3.5–5.1)
SODIUM: 133 mmol/L — AB (ref 135–145)
Total Bilirubin: 0.7 mg/dL (ref 0.3–1.2)
Total Protein: 4.8 g/dL — ABNORMAL LOW (ref 6.5–8.1)

## 2015-04-19 LAB — URINE CULTURE: Culture: NO GROWTH

## 2015-04-19 LAB — C DIFFICILE QUICK SCREEN W PCR REFLEX
C DIFFICILE (CDIFF) INTERP: NEGATIVE
C DIFFICILE (CDIFF) TOXIN: NEGATIVE
C DIFFICLE (CDIFF) ANTIGEN: NEGATIVE

## 2015-04-19 LAB — CBC
HCT: 27.7 % — ABNORMAL LOW (ref 39.0–52.0)
Hemoglobin: 9.2 g/dL — ABNORMAL LOW (ref 13.0–17.0)
MCH: 29.4 pg (ref 26.0–34.0)
MCHC: 33.2 g/dL (ref 30.0–36.0)
MCV: 88.5 fL (ref 78.0–100.0)
PLATELETS: 282 10*3/uL (ref 150–400)
RBC: 3.13 MIL/uL — AB (ref 4.22–5.81)
RDW: 16.2 % — AB (ref 11.5–15.5)
WBC: 16.5 10*3/uL — AB (ref 4.0–10.5)

## 2015-04-19 LAB — VANCOMYCIN, TROUGH: Vancomycin Tr: 13 ug/mL (ref 10.0–20.0)

## 2015-04-19 LAB — AMYLASE, BODY FLUID

## 2015-04-19 MED ORDER — ENOXAPARIN SODIUM 80 MG/0.8ML ~~LOC~~ SOLN
1.0000 mg/kg | Freq: Two times a day (BID) | SUBCUTANEOUS | Status: DC
  Administered 2015-04-19 – 2015-04-23 (×8): 65 mg via SUBCUTANEOUS
  Filled 2015-04-19 (×9): qty 0.8

## 2015-04-19 MED ORDER — MIDAZOLAM HCL 2 MG/2ML IJ SOLN
INTRAMUSCULAR | Status: AC | PRN
  Administered 2015-04-19 (×2): 0.5 mg via INTRAVENOUS

## 2015-04-19 MED ORDER — POTASSIUM CHLORIDE 10 MEQ/100ML IV SOLN
10.0000 meq | INTRAVENOUS | Status: AC
  Administered 2015-04-19 (×2): 10 meq via INTRAVENOUS
  Filled 2015-04-19 (×2): qty 100

## 2015-04-19 MED ORDER — FENTANYL CITRATE (PF) 100 MCG/2ML IJ SOLN
INTRAMUSCULAR | Status: AC | PRN
Start: 2015-04-19 — End: 2015-04-19
  Administered 2015-04-19: 25 ug via INTRAVENOUS

## 2015-04-19 MED ORDER — FENTANYL CITRATE (PF) 100 MCG/2ML IJ SOLN
INTRAMUSCULAR | Status: AC
  Filled 2015-04-19: qty 4

## 2015-04-19 MED ORDER — MIDAZOLAM HCL 2 MG/2ML IJ SOLN
INTRAMUSCULAR | Status: AC
  Filled 2015-04-19: qty 6

## 2015-04-19 NOTE — Progress Notes (Signed)
Brief pharmacy/vancomycin note:  For complete details see pharmacist-Amanda Runyon's note from earlier today:  Vanc trough=13, drawn early (6 hours), before 4th dose  Plan: Continue vancomycin 750mg  IV q8h Obtain additional vanc trough as needed  Dolly Rias RPh 04/19/2015, 3:52 PM Pager [phone]

## 2015-04-19 NOTE — Procedures (Signed)
Interventional Radiology Procedure Note  Procedure:  CT guided aspiration of LUQ abdominal fluid collection  Complications:  None  Estimated Blood Loss: < 10 mL  5 Fr Yueh catheter advanced into posterior LUQ fluid collection yielding turbid, brackish fluid.  Not purulent. Total of 46 mL able to be aspirated, resulting in near complete decompression of collection by CT. No drain placed. Will send fluid for amylase, culture studies.  Venetia Night. Kathlene Cote, M.D Pager:  [phone]

## 2015-04-19 NOTE — Progress Notes (Signed)
[facility] PROGRESS NOTE  Perry Nielsen. AST:[id number] DOB: 03/12/1941 DOA: 04/17/2015 PCP: Joycelyn Man, MD  Assessment/Plan: 1. 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 -There was concern that this could reflect post splenectomy infectious process. -IR consulted for drain placement which is planned for today -Continue empiric IV antimicrobial therapy with vancomycin and Tressie Ellis -Will follow up on cultures.   2.  Large B-cell Lymphoma  -He is currently being followed at the cancer center by Dr. Benay Spice, undergoing treatment with CHOP/Rituxan -PET scan performed 02/22/2015 showing numerous hypermetabolic bone lesions throughout the axial and appendicular skeleton as well as hypermetabolic splenic lesion. -Radiology reporting that destructive right sacral metastasis appearing stable -Dr Benay Spice notified.   3.  Right foot drop -Likely secondary to nerve compression from right sacral mass  4.  Bilateral extremity DVTs. -Ultrasound performed on 04/18/2015 revealed acute DVT involving the right common femoral vein, right femoral vein, right popliteal vein, right posterior tibial veins, right peroneal veins and left corona veins. -He was started on Lovenox with pharmacy consultation. Lovenox dose held this morning given plans to undergo procedure by interventional radiology. -Likely precipitated by underlying malignancy.  Code Status: Full code Family Communication: Spoke to his wife was present at bedside Disposition Plan: Plan for IR to place a drain   Consultants:  General surgery  HPI/Subjective: Perry Nielsen is a pleasant 74 year old with a past medical  history of large B-cell lymphoma, CD20 positive with previous imaging revealing sacral mass, undergoing splenectomy on 02/23/2015, status post therapy with CHOP/rituximab admitted to the medicine service on 04/18/2015 presenting with complaints of fever, generalized weakness and fatigue. He was found have a temperature 101.9. He was further worked up with a CT scan of abdomen and pelvis that revealed loculated fluid collections in the left upper quadrant largest measuring 6.7 cm. There is concern for this reflecting postsplenectomy infection. He was started on empiric IV antimicrobial therapy with ceftazidime and vancomycin. General surgery was consulted, recommending interventional radiology consultation for possible drain placement.  Objective: Filed Vitals:   04/19/15 1309  BP: 112/75  Pulse: 88  Temp: 98.6 F (37 C)  Resp: 16    Intake/Output Summary (Last 24 hours) at 04/19/15 1633 Last data filed at 04/19/15 0835  Gross per 24 hour  Intake 3058.33 ml  Output      0 ml  Net 3058.33 ml   Filed Weights   04/18/15 0031 04/18/15 0615 04/19/15 0500  Weight: 62.143 kg (137 lb) 63 kg (138 lb 14.2 oz) 63.8 kg (140 lb 10.5 oz)    Exam:   General:  Patient is awake and alert, no acute distress he reports having mild abdominal pain  Cardiovascular: Regular rate and rhythm normal S1-S2 no murmurs rubs or gallops  Respiratory: Normal respiratory effort, lungs are clear to auscultation bilaterally  Abdomen: Patient having pain with deep palpation over the left upper quadrant region  Musculoskeletal: No extremity edema, sarcopenia present  Data Reviewed: Basic Metabolic Panel:  Recent Labs Lab 04/18/15 0025 04/18/15 0735 04/19/15 0425  NA 134* 134* 133*  K 3.5 3.4* 3.1*  CL 100* 103 103  CO2 26 23 24   GLUCOSE 104* 98 93  BUN 9 6 6   CREATININE 0.78 0.59* 0.63  CALCIUM 8.5* 7.8* 7.8*   Liver Function Tests:  Recent Labs Lab 04/18/15 0025 04/18/15 0735 04/19/15 0425   AST 24 24 22   ALT 37 34 27  ALKPHOS 76 74 70  BILITOT 0.7 0.6 0.7  PROT 5.5* 5.2* 4.8*  ALBUMIN 2.5* 2.3* 2.1*    Recent Labs Lab 04/18/15 0735  LIPASE 60*  AMYLASE 167*   No results for input(s): AMMONIA in the last 168 hours. CBC:  Recent Labs Lab 04/18/15 0025 04/18/15 0735 04/19/15 0425  WBC 11.9* 13.3* 16.5*  NEUTROABS 10.0* 11.6*  --   HGB 10.0* 10.0* 9.2*  HCT 29.6* 29.5* 27.7*  MCV 89.2 88.9 88.5  PLT 239 270 282   Cardiac Enzymes: No results for input(s): CKTOTAL, CKMB, CKMBINDEX, TROPONINI in the last 168 hours. BNP (last 3 results) No results for input(s): BNP in the last 8760 hours.  ProBNP (last 3 results) No results for input(s): PROBNP in the last 8760 hours.  CBG: No results for input(s): GLUCAP in the last 168 hours.  Recent Results (from the past 240 hour(s))  Blood Culture (routine x 2)     Status: None (Preliminary result)   Collection Time: 04/18/15 12:21 AM  Result Value Ref Range Status   Specimen Description BLOOD RIGHT ANTECUBITAL  Final   Special Requests BOTTLES DRAWN AEROBIC AND ANAEROBIC 5ML  Final   Culture   Final    NO GROWTH 1 DAY Performed at [facility]    Report Status PENDING  Incomplete  Blood Culture (routine x 2)     Status: None (Preliminary result)   Collection Time: 04/18/15 12:24 AM  Result Value Ref Range Status   Specimen Description BLOOD LEFT ANTECUBITAL  Final   Special Requests BOTTLES DRAWN AEROBIC AND ANAEROBIC 5ML  Final   Culture   Final    NO GROWTH 1 DAY Performed at [facility]    Report Status PENDING  Incomplete  Urine culture     Status: None   Collection Time: 04/18/15  3:41 AM  Result Value Ref Range Status   Specimen Description URINE, CLEAN CATCH  Final   Special Requests NONE  Final   Culture   Final    NO GROWTH 1 DAY Performed at [facility]    Report Status 04/19/2015 FINAL  Final  C difficile quick scan w PCR reflex     Status: None   Collection Time:  04/19/15 11:22 AM  Result Value Ref Range Status   C Diff antigen NEGATIVE NEGATIVE Final   C Diff toxin NEGATIVE NEGATIVE Final   C Diff interpretation Negative for toxigenic C. difficile  Final     Studies: Dg Chest 2 View  04/18/2015  CLINICAL DATA:  74 year old male with fever and diarrhea for 48 hours. Cancer patient with metastatic disease unknown primary currently undergoing chemotherapy. Subsequent encounter. EXAM: CHEST  2 VIEW COMPARISON:  03/25/2015 and earlier FINDINGS: Semi upright AP and lateral views of the chest. Stable lung volumes. Stable cardiac size and mediastinal contours. No pneumothorax or pulmonary edema. No pleural effusion or acute pulmonary opacity identified. Severe mid thoracic pathologic compression fracture has progressed since August. Elsewhere stable visualized osseous structures. IMPRESSION: No acute cardiopulmonary abnormality. Electronically Signed   By: Genevie Ann M.D.   On: 04/18/2015 01:06   Ct Abdomen Pelvis W Contrast  04/18/2015  CLINICAL DATA:  74 year old male with abdominal pain weakness and fever. Undergoing chemotherapy. 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 Non-Hodgkin's lymphoma. Subsequent encounter. EXAM:  CT ABDOMEN AND PELVIS WITH CONTRAST TECHNIQUE: Multidetector CT imaging of the abdomen and pelvis was performed using the standard protocol following bolus administration of intravenous contrast. CONTRAST:  178mL OMNIPAQUE IOHEXOL 300 MG/ML  SOLN COMPARISON:  PET-CT 02/22/2015.  CT Abdomen and Pelvis 02/06/2015. FINDINGS: Lower lung volumes. Increased dependent opacity in both lungs most resembles atelectasis. No pericardial or pleural effusion. Destructive right sacral metastasis. Stable visualized osseous structures. Fluid in the distal colon. No pelvic free fluid. Unremarkable urinary bladder. Fluid in the sigmoid colon and descending colon. Abnormal left upper quadrant and splenic flexure with thickened and inflamed colon surrounded by extensive  mesenteric stranding and loculated fluid collections. The largest collection is 6.7 cm inseparable from the tail of the pancreas. Other loculations of fluid also track into the lesser sac. There is associated inflammatory stranding. However, the pancreatic parenchyma still appears fairly distinct. There is secondary involvement of some small bowel in the left upper quadrant. Proximal to these changes there is gaseous distension of the transverse colon and proximal splenic flexure. There is fluid in the right colon. Fluid-filled nondilated distal small bowel. Much of the proximal small bowel is decompressed. The inflammatory stranding also involves the residual gastrosplenic ligament and proximal stomach with associated wall thickening. The distal stomach and duodenum are spared. However, the proximal jejunum is secondarily affected. No abdominal free fluid or free air. Liver, gallbladder, adrenal glands, and kidneys are stable. Portal venous system is patent. Major arterial structures are patent. Stable mild infrarenal abdominal aortic aneurysm measuring 3.1 cm diameter. Soft and calcified aortic plaque. No lymphadenopathy in the abdomen or pelvis. IMPRESSION: 1. Status post splenectomy since the prior CTs. Extensive inflammation in the left upper quadrant affecting in the splenic flexure of colon, the proximal small bowel, and the proximal stomach. Associated loculated fluid collections, the largest of which (6.7 cm) is inseparable from the pancreatic tail and most resembles a pseudocyst. Changes track into the lesser sac and I favor these are the sequelae of pancreatitis although the pancreatic enhancement appears relatively normal. Main differential consideration is post splenectomy infection. 2. No other acute or inflammatory process identified in the abdomen or pelvis. 3. Destructive right sacral metastasis appears stable. 4. Mild pulmonary atelectasis. Electronically Signed   By: Genevie Ann M.D.   On: 04/18/2015  03:02    Scheduled Meds: . antiseptic oral rinse  7 mL Mouth Rinse q12n4p  . cefTAZidime (FORTAZ)  IV  2 g Intravenous 3 times per day  . chlorhexidine  15 mL Mouth Rinse BID  . famotidine  20 mg Oral Daily  . sodium chloride  3 mL Intravenous Q12H  . traZODone  100 mg Oral QHS  . vancomycin  750 mg Intravenous Q8H   Continuous Infusions: . sodium chloride 100 mL/hr at 04/19/15 0127    Principal Problem:   Intra-abdominal abscess [facility]) Active Problems:   GERD   Foot drop   Spine metastasis ([place])   Lymphoma, large cell ([place])   Sepsis ([place])    Time spent: 30 min    Kelvin Cellar  [facility] Pager [phone]. If 7PM-7AM, please contact night-coverage at [email or web address], password [facility] 04/19/2015, 4:33 PM  LOS: 1 day

## 2015-04-19 NOTE — Progress Notes (Signed)
IP PROGRESS NOTE  Subjective:   Perry Nielsen is well-known to me with a recent diagnosis of non-Hodgkin's lymphoma. He completed a second cycle of CHOP-rituximab on 04/05/2015. He received Neulasta 04/08/2015. He reports tolerating the chemotherapy well. He complains of anorexia. He was admitted with a fever and diarrhea on 04/18/2015. A CT of the abdomen noted an inflammatory process with loculated fluid collections in the left upper abdomen. The nursing staff report he has been confused.  Objective: Vital signs in last 24 hours: Blood pressure 112/75, pulse 88, temperature 98.6 F (37 C), temperature source Oral, resp. rate 16, height 6' (1.829 m), weight 140 lb 10.5 oz (63.8 kg), SpO2 97 %.  Intake/Output from previous day: 10/13 0701 - 10/14 0700 In: 2200 [I.V.:2200] Out: -   Physical Exam:  HEENT: No thrush Lungs: Clear anteriorly Cardiac: Regular rate and rhythm Abdomen: Soft, no hepatomegaly, mild tenderness in the midabdomen Extremities: 1+ pitting edema at the right lower leg Neurologic: Alert, oriented to place and year. Oriented to diagnosis, follows commands, he appears confused.   Lab Results:  Recent Labs  04/18/15 0735 04/19/15 0425  WBC 13.3* 16.5*  HGB 10.0* 9.2*  HCT 29.5* 27.7*  PLT 270 282    BMET  Recent Labs  04/18/15 0735 04/19/15 0425  NA 134* 133*  K 3.4* 3.1*  CL 103 103  CO2 23 24  GLUCOSE 98 93  BUN 6 6  CREATININE 0.59* 0.63  CALCIUM 7.8* 7.8*    Studies/Results: Dg Chest 2 View  04/18/2015  CLINICAL DATA:  74 year old male with fever and diarrhea for 48 hours. Cancer patient with metastatic disease unknown primary currently undergoing chemotherapy. Subsequent encounter. EXAM: CHEST  2 VIEW COMPARISON:  03/25/2015 and earlier FINDINGS: Semi upright AP and lateral views of the chest. Stable lung volumes. Stable cardiac size and mediastinal contours. No pneumothorax or pulmonary edema. No pleural effusion or acute pulmonary opacity  identified. Severe mid thoracic pathologic compression fracture has progressed since August. Elsewhere stable visualized osseous structures. IMPRESSION: No acute cardiopulmonary abnormality. Electronically Signed   By: Genevie Ann M.D.   On: 04/18/2015 01:06   Ct Abdomen Pelvis W Contrast  04/18/2015  CLINICAL DATA:  74 year old male with abdominal pain weakness and fever. Undergoing chemotherapy. 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 Non-Hodgkin's lymphoma. Subsequent encounter. EXAM: CT ABDOMEN AND PELVIS WITH CONTRAST TECHNIQUE: Multidetector CT imaging of the abdomen and pelvis was performed using the standard protocol following bolus administration of intravenous contrast. CONTRAST:  118m OMNIPAQUE IOHEXOL 300 MG/ML  SOLN COMPARISON:  PET-CT 02/22/2015.  CT Abdomen and Pelvis 02/06/2015. FINDINGS: Lower lung volumes. Increased dependent opacity in both lungs most resembles atelectasis. No pericardial or pleural effusion. Destructive right sacral metastasis. Stable visualized osseous structures. Fluid in the distal colon. No pelvic free fluid. Unremarkable urinary bladder. Fluid in the sigmoid colon and descending colon. Abnormal left upper quadrant and splenic flexure with thickened and inflamed colon surrounded by extensive mesenteric stranding and loculated fluid collections. The largest collection is 6.7 cm inseparable from the tail of the pancreas. Other loculations of fluid also track into the lesser sac. There is associated inflammatory stranding. However, the pancreatic parenchyma still appears fairly distinct. There is secondary involvement of some small bowel in the left upper quadrant. Proximal to these changes there is gaseous distension of the transverse colon and proximal splenic flexure. There is fluid in the right colon. Fluid-filled nondilated distal small bowel. Much of the proximal small bowel is decompressed. The inflammatory stranding also  involves the residual gastrosplenic ligament and  proximal stomach with associated wall thickening. The distal stomach and duodenum are spared. However, the proximal jejunum is secondarily affected. No abdominal free fluid or free air. Liver, gallbladder, adrenal glands, and kidneys are stable. Portal venous system is patent. Major arterial structures are patent. Stable mild infrarenal abdominal aortic aneurysm measuring 3.1 cm diameter. Soft and calcified aortic plaque. No lymphadenopathy in the abdomen or pelvis. IMPRESSION: 1. Status post splenectomy since the prior CTs. Extensive inflammation in the left upper quadrant affecting in the splenic flexure of colon, the proximal small bowel, and the proximal stomach. Associated loculated fluid collections, the largest of which (6.7 cm) is inseparable from the pancreatic tail and most resembles a pseudocyst. Changes track into the lesser sac and I favor these are the sequelae of pancreatitis although the pancreatic enhancement appears relatively normal. Main differential consideration is post splenectomy infection. 2. No other acute or inflammatory process identified in the abdomen or pelvis. 3. Destructive right sacral metastasis appears stable. 4. Mild pulmonary atelectasis. Electronically Signed   By: Genevie Ann M.D.   On: 04/18/2015 03:02    Medications: I have reviewed the patient's current medications.  Assessment/Plan:  1. 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 History of Thrombocytopenia predating chemotherapy 3. T7 compression fracture-likely pathologic 4. 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 Right foot pain-likely secondary to nerve compression from the sacral mass, improved 6. Diffuse erythematous rash, appearance consistent with a "drug "rash-resolved 7. History of Low-grade fever likely secondary to atelectasis/postop fever versus tumor fever 8. Hepatitis B core antibody positive 9. Altered mental status-likely secondary delirium in the setting of infection and hospitalization 10. Admission 04/18/2015 with fever-likely secondary to an abdominal infection 11. Bilateral lower extremity DVTs on a Doppler 04/18/2015, now on Lovenox  Perry Nielsen has completed 2 cycles of CHOP/Rituxan for treatment of advanced stage non-Hodgkin's lymphoma. He is now admitted with a fever and CT evidence of an inflammatory process/abscesses in the left upper quadrant. He has been confused intermittently over the past month secondary to delirium associated with acute illness and hospitalizations.   There is no clinical evidence for progression of lymphoma. The CBC is adequate.  Recommendations: 1. Broad-spectrum antibiotics, drainage of fluid collection per interventional radiology 2. Lovenox anticoagulation 3. Please call Oncology as needed over the weekend. I will check on him 04/22/2015.    LOS: 1 day   Johnaton Sonneborn  04/19/2015, 2:15 PM

## 2015-04-19 NOTE — Care Management Important Message (Signed)
Important Message  Patient Details IM Letter given to Kathy/Case Manager to present to [place] Message  Patient Details  Name: Perry Nielsen. MRN: [id number] Date of Birth: 1941/02/04   Medicare Important Message Given:  Yes-second notification given    Camillo Flaming 04/19/2015, 1:01 PM Name: Perry Nielsen. MRN: [id number] Date of Birth: [birth year]   Medicare Important Message Given:  Yes-second notification given    Camillo Flaming 04/19/2015, 1:00 PM

## 2015-04-19 NOTE — Progress Notes (Addendum)
PHARMACY CONSULT NOTE - Follow Up  Pharmacy Consult for Vancomycin, Ceftazidime Indication: sepsis/intra-abdominal abscess   Pharmacy Consult for Lovenox Indication: NEW DVT  Allergies  Allergen Reactions  . Antihistamines, Diphenhydramine-Type     CAUSES PROSTATE TO SWELL & as a child had hyperactivity when took Benadryl Pt took Pepcid w/ Benadryl PO and did fine per Pt & his wife    Patient Measurements: Height: 6' (182.9 cm) Weight: 140 lb 10.5 oz (63.8 kg) IBW/kg (Calculated) : 77.6   Vital Signs: Temp: 99.1 F (37.3 C) (10/14 0610) Temp Source: Oral (10/14 0610) BP: 109/62 mmHg (10/14 0610) Pulse Rate: 106 (10/14 0610) Intake/Output from previous day: 10/13 0701 - 10/14 0700 In: 2200 [I.V.:2200] Out: -  Intake/Output from this shift:    Labs:  Recent Labs  04/18/15 0025 04/18/15 0735 04/19/15 0425  WBC 11.9* 13.3* 16.5*  HGB 10.0* 10.0* 9.2*  PLT 239 270 282  CREATININE 0.78 0.59* 0.63   Estimated Creatinine Clearance: 74.2 mL/min (by C-G formula based on Cr of 0.63). No results for input(s): VANCOTROUGH, VANCOPEAK, VANCORANDOM, GENTTROUGH, GENTPEAK, GENTRANDOM, TOBRATROUGH, TOBRAPEAK, TOBRARND, AMIKACINPEAK, AMIKACINTROU, AMIKACIN in the last 72 hours.   Microbiology: Recent Results (from the past 720 hour(s))  Culture, blood (routine x 2)     Status: None   Collection Time: 03/26/15 12:20 PM  Result Value Ref Range Status   Specimen Description BLOOD LEFT ARM  Final   Special Requests BOTTLES DRAWN AEROBIC AND ANAEROBIC 10CC  Final   Culture   Final    NO GROWTH 5 DAYS Performed at [facility]    Report Status 03/31/2015 FINAL  Final  Culture, blood (routine x 2)     Status: None   Collection Time: 03/26/15 12:25 PM  Result Value Ref Range Status   Specimen Description BLOOD RIGHT HAND  Final   Special Requests BOTTLES DRAWN AEROBIC AND ANAEROBIC 5CC  Final   Culture   Final    NO GROWTH 5 DAYS Performed at [facility]    Report Status 03/31/2015 FINAL  Final    Assessment: 34 yoM with h/o lymphoma undergoing chemotherapy and s/p splenectomy for splenic mass. CAT scan shows extensive inflammation with loculated fluid collection suggesting possible infection or abscess. Ceftazidime and Vancomycin started empirically.  CT-guided aspiration by IR planned for this evening and fluid will be sent for culture.  Patient was also found to have DVT and Lovenox was started 10/13 but held this morning for procedure.  10/13 Flagyl x 1 10/13 Vanc >> 10/13 Ceftaz >>  10/13 blood 10/13 urine Cdiff scan ordered 10/14 planned fluid collection  Today, Tmax 99.1, WBC elevated, SCr stable with CrCl~74 ml/min.  Will check a trough level today to see if q8h dosing of vancomycin is appropriate.  Goal of Therapy:  Vancomycin trough level 15-20 mcg/ml  Appropriate antibiotic dosing for indication and renal function. Eradication of infection  Plan:  1.  Check vancomycin trough this evening prior to 4th dose. 2.  Continue Ceftazidime 2g IV q8h. 3.  F/u culture results, SCr, clinical course. 4.  Will resume Lovenox tonight after IR procedure this evening.  F/u CBC.  Hershal Coria, PharmD, BCPS Pager: [phone] 04/19/2015 8:16 AM

## 2015-04-19 NOTE — Progress Notes (Signed)
Subjective: No acute discomfort, he says he is marginally better than he was when he came in.  Awaiting IR to drain site.  Objective: Vital signs in last 24 hours: Temp:  [98.3 F (36.8 C)-99.1 F (37.3 C)] 99.1 F (37.3 C) (10/14 0610) Pulse Rate:  [102-106] 106 (10/14 0610) Resp:  [20] 20 (10/14 0610) BP: (107-109)/(51-62) 109/62 mmHg (10/14 0610) SpO2:  [94 %-98 %] 94 % (10/14 0610) Weight:  [63.8 kg (140 lb 10.5 oz)] 63.8 kg (140 lb 10.5 oz) (10/14 0500) Last BM Date: 04/18/15 Afebrile, VSS K+ 3.1 WBC up more Intake/Output from previous day: 10/13 0701 - 10/14 0700 In: 2200 [I.V.:2200] Out: -  Intake/Output this shift: Total I/O In: 858.3 [I.V.:158.3; IV Piggyback:700] Out: -   General appearance: alert, cooperative and no distress GI: soft, not very tender, sites healed.  No distension.  Lab Results:   Recent Labs  04/18/15 0735 04/19/15 0425  WBC 13.3* 16.5*  HGB 10.0* 9.2*  HCT 29.5* 27.7*  PLT 270 282    BMET  Recent Labs  04/18/15 0735 04/19/15 0425  NA 134* 133*  K 3.4* 3.1*  CL 103 103  CO2 23 24  GLUCOSE 98 93  BUN 6 6  CREATININE 0.59* 0.63  CALCIUM 7.8* 7.8*   PT/INR  Recent Labs  04/18/15 0025 04/18/15 0735  LABPROT 15.9* 16.7*  INR 1.26 1.34     Recent Labs Lab 04/18/15 0025 04/18/15 0735 04/19/15 0425  AST 24 24 22   ALT 37 34 27  ALKPHOS 76 74 70  BILITOT 0.7 0.6 0.7  PROT 5.5* 5.2* 4.8*  ALBUMIN 2.5* 2.3* 2.1*     Lipase     Component Value Date/Time   LIPASE 60* 04/18/2015 0735     Studies/Results: Dg Chest 2 View  04/18/2015  CLINICAL DATA:  74 year old male with fever and diarrhea for 48 hours. Cancer patient with metastatic disease unknown primary currently undergoing chemotherapy. Subsequent encounter. EXAM: CHEST  2 VIEW COMPARISON:  03/25/2015 and earlier FINDINGS: Semi upright AP and lateral views of the chest. Stable lung volumes. Stable cardiac size and mediastinal contours. No pneumothorax or  pulmonary edema. No pleural effusion or acute pulmonary opacity identified. Severe mid thoracic pathologic compression fracture has progressed since August. Elsewhere stable visualized osseous structures. IMPRESSION: No acute cardiopulmonary abnormality. Electronically Signed   By: Genevie Ann M.D.   On: 04/18/2015 01:06   Ct Abdomen Pelvis W Contrast  04/18/2015  CLINICAL DATA:  74 year old male with abdominal pain weakness and fever. Undergoing chemotherapy. 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 Non-Hodgkin's lymphoma. Subsequent encounter. EXAM: CT ABDOMEN AND PELVIS WITH CONTRAST TECHNIQUE: Multidetector CT imaging of the abdomen and pelvis was performed using the standard protocol following bolus administration of intravenous contrast. CONTRAST:  169mL OMNIPAQUE IOHEXOL 300 MG/ML  SOLN COMPARISON:  PET-CT 02/22/2015.  CT Abdomen and Pelvis 02/06/2015. FINDINGS: Lower lung volumes. Increased dependent opacity in both lungs most resembles atelectasis. No pericardial or pleural effusion. Destructive right sacral metastasis. Stable visualized osseous structures. Fluid in the distal colon. No pelvic free fluid. Unremarkable urinary bladder. Fluid in the sigmoid colon and descending colon. Abnormal left upper quadrant and splenic flexure with thickened and inflamed colon surrounded by extensive mesenteric stranding and loculated fluid collections. The largest collection is 6.7 cm inseparable from the tail of the pancreas. Other loculations of fluid also track into the lesser sac. There is associated inflammatory stranding. However, the pancreatic parenchyma still appears fairly distinct. There is secondary involvement of some  small bowel in the left upper quadrant. Proximal to these changes there is gaseous distension of the transverse colon and proximal splenic flexure. There is fluid in the right colon. Fluid-filled nondilated distal small bowel. Much of the proximal small bowel is decompressed. The inflammatory  stranding also involves the residual gastrosplenic ligament and proximal stomach with associated wall thickening. The distal stomach and duodenum are spared. However, the proximal jejunum is secondarily affected. No abdominal free fluid or free air. Liver, gallbladder, adrenal glands, and kidneys are stable. Portal venous system is patent. Major arterial structures are patent. Stable mild infrarenal abdominal aortic aneurysm measuring 3.1 cm diameter. Soft and calcified aortic plaque. No lymphadenopathy in the abdomen or pelvis. IMPRESSION: 1. Status post splenectomy since the prior CTs. Extensive inflammation in the left upper quadrant affecting in the splenic flexure of colon, the proximal small bowel, and the proximal stomach. Associated loculated fluid collections, the largest of which (6.7 cm) is inseparable from the pancreatic tail and most resembles a pseudocyst. Changes track into the lesser sac and I favor these are the sequelae of pancreatitis although the pancreatic enhancement appears relatively normal. Main differential consideration is post splenectomy infection. 2. No other acute or inflammatory process identified in the abdomen or pelvis. 3. Destructive right sacral metastasis appears stable. 4. Mild pulmonary atelectasis. Electronically Signed   By: Genevie Ann M.D.   On: 04/18/2015 03:02    Medications: . antiseptic oral rinse  7 mL Mouth Rinse q12n4p  . cefTAZidime (FORTAZ)  IV  2 g Intravenous 3 times per day  . chlorhexidine  15 mL Mouth Rinse BID  . famotidine  20 mg Oral Daily  . sodium chloride  3 mL Intravenous Q12H  . traZODone  100 mg Oral QHS  . vancomycin  750 mg Intravenous Q8H   . sodium chloride 100 mL/hr at 04/19/15 0127    Assessment/Plan Large B-cell lymphoma, with bone metastasis/Chemotherapy Dr. Benay Spice LUQ fluid collection/inflammatory changes with abdominal pain. S/p laparoscopic splenectomy 03/05/15, Dr. Excell Seltzer DVT Right foot drop with right sacral mass/nerve  compression Spinal metastasis  antibiotics:  Day 3 Ceftazidime/Flagyl/Vancomycin DVT:  Lovenox to start after IR procedure/SCD's added  Plan:  Await IR aspiration and follow.      LOS: 1 day    Perry Nielsen 04/19/2015

## 2015-04-20 DIAGNOSIS — I82409 Acute embolism and thrombosis of unspecified deep veins of unspecified lower extremity: Secondary | ICD-10-CM

## 2015-04-20 LAB — CBC
HEMATOCRIT: 27 % — AB (ref 39.0–52.0)
Hemoglobin: 8.9 g/dL — ABNORMAL LOW (ref 13.0–17.0)
MCH: 29.1 pg (ref 26.0–34.0)
MCHC: 33 g/dL (ref 30.0–36.0)
MCV: 88.2 fL (ref 78.0–100.0)
PLATELETS: 285 10*3/uL (ref 150–400)
RBC: 3.06 MIL/uL — ABNORMAL LOW (ref 4.22–5.81)
RDW: 16.3 % — AB (ref 11.5–15.5)
WBC: 17.7 10*3/uL — AB (ref 4.0–10.5)

## 2015-04-20 LAB — BASIC METABOLIC PANEL
Anion gap: 8 (ref 5–15)
BUN: 5 mg/dL — AB (ref 6–20)
CHLORIDE: 102 mmol/L (ref 101–111)
CO2: 24 mmol/L (ref 22–32)
CREATININE: 0.63 mg/dL (ref 0.61–1.24)
Calcium: 7.7 mg/dL — ABNORMAL LOW (ref 8.9–10.3)
GFR calc Af Amer: >60 mL/min (ref >60)
GFR calc non Af Amer: >60 mL/min (ref >60)
GLUCOSE: 96 mg/dL (ref 65–99)
POTASSIUM: 3.2 mmol/L — AB (ref 3.5–5.1)
SODIUM: 134 mmol/L — AB (ref 135–145)

## 2015-04-20 MED ORDER — POTASSIUM CHLORIDE CRYS ER 20 MEQ PO TBCR
40.0000 meq | EXTENDED_RELEASE_TABLET | Freq: Four times a day (QID) | ORAL | Status: AC
  Administered 2015-04-20 (×3): 40 meq via ORAL
  Filled 2015-04-20 (×3): qty 2

## 2015-04-20 NOTE — Progress Notes (Signed)
Patient ID: Perry Nielsen., male   DOB: 01/19/1941, 74 y.o.   MRN: [id number]  [facility]  Subjective: Patient in bed, pleasant, no complaints.  Tolerating regular diet.  Denies abdominal pain.  Objective: Vital signs in last 24 hours: Temp:  [98.6 F (37 C)-99.1 F (37.3 C)] 98.7 F (37.1 C) (10/15 0455) Pulse Rate:  [88-101] 101 (10/15 0455) Resp:  [16-22] 22 (10/15 0455) BP: (96-120)/(53-75) 111/67 mmHg (10/15 0455) SpO2:  [93 %-100 %] 93 % (10/15 0455) Weight:  [64.2 kg (141 lb 8.6 oz)] 64.2 kg (141 lb 8.6 oz) (10/15 0455) Last BM Date: 04/19/15  Intake/Output from previous day: 10/14 0701 - 10/15 0700 In: 978.3 [P.O.:120; I.V.:158.3; IV Piggyback:700] Out: 750 [Urine:750] Intake/Output this shift: Total I/O In: -  Out: 250 [Urine:250]  Physical Exam: HEENT - sclerae clear, mucous membranes moist Neck - soft Chest - clear bilaterally Cor - RRR Abdomen - soft, scaphoid; BS present; non-tender; no tenderness left flank  Lab Results:   Recent Labs  04/19/15 0425 04/20/15 0515  WBC 16.5* 17.7*  HGB 9.2* 8.9*  HCT 27.7* 27.0*  PLT 282 285   BMET  Recent Labs  04/19/15 0425 04/20/15 0515  NA 133* 134*  K 3.1* 3.2*  CL 103 102  CO2 24 24  GLUCOSE 93 96  BUN 6 5*  CREATININE 0.63 0.63  CALCIUM 7.8* 7.7*   PT/INR  Recent Labs  04/18/15 0025 04/18/15 0735  LABPROT 15.9* 16.7*  INR 1.26 1.34   Comprehensive Metabolic Panel:    Component Value Date/Time   NA 134* 04/20/2015 0515   NA 133* 04/19/2015 0425   NA 137 04/05/2015 0851   NA 140 03/29/2015 1257   K 3.2* 04/20/2015 0515   K 3.1* 04/19/2015 0425   K 4.3 04/05/2015 0851   K 3.2* 03/29/2015 1257   CL 102 04/20/2015 0515   CL 103 04/19/2015 0425   CO2 24 04/20/2015 0515   CO2 24 04/19/2015 0425   CO2 19* 04/05/2015 0851   CO2 25 03/29/2015 1257   BUN 5* 04/20/2015 0515   BUN 6 04/19/2015 0425   BUN 9.3 04/05/2015 0851   BUN 10.9 03/29/2015  1257   CREATININE 0.63 04/20/2015 0515   CREATININE 0.63 04/19/2015 0425   CREATININE 0.8 04/05/2015 0851   CREATININE 0.9 03/29/2015 1257   GLUCOSE 96 04/20/2015 0515   GLUCOSE 93 04/19/2015 0425   GLUCOSE 120 04/05/2015 0851   GLUCOSE 104 03/29/2015 1257   GLUCOSE 91 04/23/2006 1003   CALCIUM 7.7* 04/20/2015 0515   CALCIUM 7.8* 04/19/2015 0425   CALCIUM 8.8 04/05/2015 0851   CALCIUM 8.3* 03/29/2015 1257   AST 22 04/19/2015 0425   AST 24 04/18/2015 0735   AST 20 04/05/2015 0851   AST 15 03/29/2015 1257   ALT 27 04/19/2015 0425   ALT 34 04/18/2015 0735   ALT 25 04/05/2015 0851   ALT 18 03/29/2015 1257   ALKPHOS 70 04/19/2015 0425   ALKPHOS 74 04/18/2015 0735   ALKPHOS 75 04/05/2015 0851   ALKPHOS 90 03/29/2015 1257   BILITOT 0.7 04/19/2015 0425   BILITOT 0.6 04/18/2015 0735   BILITOT 0.52 04/05/2015 0851   BILITOT 0.38 03/29/2015 1257   PROT 4.8* 04/19/2015 0425   PROT 5.2* 04/18/2015 0735   PROT 5.8* 04/05/2015 0851   PROT 5.6* 03/29/2015 1257   ALBUMIN 2.1* 04/19/2015 0425   ALBUMIN 2.3* 04/18/2015 0735   ALBUMIN 2.6* 04/05/2015 6759  ALBUMIN 2.7* 03/29/2015 1257    Studies/Results: No results found.  Anti-infectives: Anti-infectives    Start     Dose/Rate Route Frequency Ordered Stop   04/18/15 1600  vancomycin (VANCOCIN) IVPB 750 mg/150 ml premix     750 mg 150 mL/hr over 60 Minutes Intravenous Every 8 hours 04/18/15 1459     04/18/15 1400  cefTAZidime (FORTAZ) 2 g in dextrose 5 % 50 mL IVPB     2 g 100 mL/hr over 30 Minutes Intravenous 3 times per day 04/18/15 0515     04/18/15 0230  cefTAZidime (FORTAZ) 2 g in dextrose 5 % 50 mL IVPB     2 g 100 mL/hr over 30 Minutes Intravenous  Once 04/18/15 0220 04/18/15 0500   04/18/15 0130  vancomycin (VANCOCIN) IVPB 1000 mg/200 mL premix     1,000 mg 200 mL/hr over 60 Minutes Intravenous  Once 04/18/15 0118 04/18/15 0500   04/18/15 0130  metroNIDAZOLE (FLAGYL) IVPB 500 mg     500 mg 100 mL/hr over 60 Minutes  Intravenous  Once 04/18/15 0118 04/18/15 0340      Assessment & Plans: LUQ fluid collection following lap splenectomy  IR aspirated yesterday - amylase >10K consistent with pseudocyst  Cultures pending, lipase pending  Would recommend 7 days empiric abx.  Would allow po diet.  Will check lab results and follow with you.  Likely no immediate intervention for pseudocyst - will need follow up CT abd in 4-6 weeks with follow up at [place] office with Dr. Excell Seltzer.  Earnstine Regal, MD, [facility] Office: [place] 04/20/2015

## 2015-04-20 NOTE — Progress Notes (Signed)
[facility] PROGRESS NOTE  Dyanne Iha. DHR:[id number] DOB: 1940-10-23 DOA: 04/17/2015 PCP: Joycelyn Man, MD  Assessment/Plan: 1. 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 -There was concern that this could reflect post splenectomy infectious process. -IR consulted undergoing aspiration on suspected pseudocyst on 04/19/2015. No drain placed.  -Continue empiric IV antimicrobial therapy with vancomycin and Tressie Ellis -Spoke with Dr Harlow Asa, amylase in fluid elevated. This likely represents pseudocyct. Will follow cultures. Meanwhile plan for a total of 7 days of antimicrobial therapy. Today is day 3 of AB's. Plan to transition to oral AB in am.   2.  Large B-cell Lymphoma  -He is currently being followed at the cancer center by Dr. Benay Spice, undergoing treatment with CHOP/Rituxan -PET scan performed 02/22/2015 showing numerous hypermetabolic bone lesions throughout the axial and appendicular skeleton as well as hypermetabolic splenic lesion. -Radiology reporting that destructive right sacral metastasis appearing stable -Dr Benay Spice evaluated patient on 04/19/2015   3.  Right foot drop -Likely secondary to nerve compression from right sacral mass  4.  Bilateral extremity DVTs. -Ultrasound performed on 04/18/2015 revealed acute DVT involving the right common femoral vein, right femoral vein, right popliteal vein, right posterior tibial veins, right peroneal veins and left corona veins. -He was started on Lovenox with pharmacy consultation. Lovenox dose held this morning given plans to undergo procedure by interventional radiology. -Likely precipitated by underlying  malignancy. -Will need Lovenox teaching.  Code Status: Full code Family Communication: Spoke to his wife was present at bedside Disposition Plan: Anticipate discharge in the next 48 hours.    Consultants:  General surgery  HPI/Subjective: [name] is a pleasant 74 year old with a past medical history of large B-cell lymphoma, CD20 positive with previous imaging revealing sacral mass, undergoing splenectomy on 02/23/2015, status post therapy with CHOP/rituximab admitted to the medicine service on 04/18/2015 presenting with complaints of fever, generalized weakness and fatigue. He was found have a temperature 101.9. He was further worked up with a CT scan of abdomen and pelvis that revealed loculated fluid collections in the left upper quadrant largest measuring 6.7 cm. There is concern for this reflecting postsplenectomy infection. He was started on empiric IV antimicrobial therapy with ceftazidime and vancomycin. General surgery was consulted, recommending interventional radiology consultation for possible drain placement.  Objective: Filed Vitals:   04/20/15 0455  BP: 111/67  Pulse: 101  Temp: 98.7 F (37.1 C)  Resp: 22    Intake/Output Summary (Last 24 hours) at 04/20/15 1216 Last data filed at 04/20/15 1050  Gross per 24 hour  Intake    240 ml  Output   1200 ml  Net   -960 ml   Filed Weights   04/18/15 0615 04/19/15 0500 04/20/15 0455  Weight: 63 kg (138 lb 14.2 oz) 63.8 kg (140 lb 10.5 oz) 64.2 kg (141 lb 8.6 oz)    Exam:   General:  Patient is awake and alert, no acute distress, has no complaints  Cardiovascular: Regular rate and rhythm normal S1-S2 no murmurs rubs or gallops  Respiratory: Normal respiratory effort, lungs are clear to auscultation bilaterally  Abdomen: Patient having pain with deep palpation over the left upper quadrant region  Musculoskeletal: No extremity edema, sarcopenia present  Data Reviewed: Basic  Metabolic Panel:  Recent Labs Lab  04/18/15 0025 04/18/15 0735 04/19/15 0425 04/20/15 0515  NA 134* 134* 133* 134*  K 3.5 3.4* 3.1* 3.2*  CL 100* 103 103 102  CO2 26 23 24 24   GLUCOSE 104* 98 93 96  BUN 9 6 6  5*  CREATININE 0.78 0.59* 0.63 0.63  CALCIUM 8.5* 7.8* 7.8* 7.7*   Liver Function Tests:  Recent Labs Lab 04/18/15 0025 04/18/15 0735 04/19/15 0425  AST 24 24 22   ALT 37 34 27  ALKPHOS 76 74 70  BILITOT 0.7 0.6 0.7  PROT 5.5* 5.2* 4.8*  ALBUMIN 2.5* 2.3* 2.1*    Recent Labs Lab 04/18/15 0735  LIPASE 60*  AMYLASE 167*   No results for input(s): AMMONIA in the last 168 hours. CBC:  Recent Labs Lab 04/18/15 0025 04/18/15 0735 04/19/15 0425 04/20/15 0515  WBC 11.9* 13.3* 16.5* 17.7*  NEUTROABS 10.0* 11.6*  --   --   HGB 10.0* 10.0* 9.2* 8.9*  HCT 29.6* 29.5* 27.7* 27.0*  MCV 89.2 88.9 88.5 88.2  PLT 239 270 282 285   Cardiac Enzymes: No results for input(s): CKTOTAL, CKMB, CKMBINDEX, TROPONINI in the last 168 hours. BNP (last 3 results) No results for input(s): BNP in the last 8760 hours.  ProBNP (last 3 results) No results for input(s): PROBNP in the last 8760 hours.  CBG: No results for input(s): GLUCAP in the last 168 hours.  Recent Results (from the past 240 hour(s))  Blood Culture (routine x 2)     Status: None (Preliminary result)   Collection Time: 04/18/15 12:21 AM  Result Value Ref Range Status   Specimen Description BLOOD RIGHT ANTECUBITAL  Final   Special Requests BOTTLES DRAWN AEROBIC AND ANAEROBIC 5ML  Final   Culture   Final    NO GROWTH 1 DAY Performed at [facility]    Report Status PENDING  Incomplete  Blood Culture (routine x 2)     Status: None (Preliminary result)   Collection Time: 04/18/15 12:24 AM  Result Value Ref Range Status   Specimen Description BLOOD LEFT ANTECUBITAL  Final   Special Requests BOTTLES DRAWN AEROBIC AND ANAEROBIC 5ML  Final   Culture   Final    NO GROWTH 1 DAY Performed at [facility]    Report Status PENDING   Incomplete  Urine culture     Status: None   Collection Time: 04/18/15  3:41 AM  Result Value Ref Range Status   Specimen Description URINE, CLEAN CATCH  Final   Special Requests NONE  Final   Culture   Final    NO GROWTH 1 DAY Performed at [facility]    Report Status 04/19/2015 FINAL  Final  C difficile quick scan w PCR reflex     Status: None   Collection Time: 04/19/15 11:22 AM  Result Value Ref Range Status   C Diff antigen NEGATIVE NEGATIVE Final   C Diff toxin NEGATIVE NEGATIVE Final   C Diff interpretation Negative for toxigenic C. difficile  Final     Studies: No results found.  Scheduled Meds: . antiseptic oral rinse  7 mL Mouth Rinse q12n4p  . cefTAZidime (FORTAZ)  IV  2 g Intravenous 3 times per day  . chlorhexidine  15 mL Mouth Rinse BID  . enoxaparin (LOVENOX) injection  1 mg/kg Subcutaneous Q12H  . famotidine  20 mg Oral Daily  . potassium chloride  40 mEq Oral Q6H  . sodium chloride  3  mL Intravenous Q12H  . traZODone  100 mg Oral QHS  . vancomycin  750 mg Intravenous Q8H   Continuous Infusions:    Principal Problem:   Intra-abdominal abscess (HCC) Active Problems:   DVT (deep venous thrombosis) (HCC)   GERD   Foot drop   Spine metastasis (HCC)   Lymphoma, large cell ([place])   Sepsis ([place])   Abdominal fluid collection    Time spent: 25 min    Kelvin Cellar  [facility] Pager [phone]. If 7PM-7AM, please contact night-coverage at [email or web address], password [facility] 04/20/2015, 12:16 PM  LOS: 2 days

## 2015-04-21 ENCOUNTER — Other Ambulatory Visit: Payer: Self-pay | Admitting: Oncology

## 2015-04-21 ENCOUNTER — Inpatient Hospital Stay (HOSPITAL_COMMUNITY): Payer: Medicare Other

## 2015-04-21 DIAGNOSIS — C7951 Secondary malignant neoplasm of bone: Secondary | ICD-10-CM

## 2015-04-21 LAB — CBC
HCT: 29.1 % — ABNORMAL LOW (ref 39.0–52.0)
Hemoglobin: 9.5 g/dL — ABNORMAL LOW (ref 13.0–17.0)
MCH: 29.1 pg (ref 26.0–34.0)
MCHC: 32.6 g/dL (ref 30.0–36.0)
MCV: 89 fL (ref 78.0–100.0)
PLATELETS: 354 10*3/uL (ref 150–400)
RBC: 3.27 MIL/uL — AB (ref 4.22–5.81)
RDW: 16.3 % — AB (ref 11.5–15.5)
WBC: 16.5 10*3/uL — AB (ref 4.0–10.5)

## 2015-04-21 LAB — BASIC METABOLIC PANEL
Anion gap: 8 (ref 5–15)
CHLORIDE: 106 mmol/L (ref 101–111)
CO2: 26 mmol/L (ref 22–32)
CREATININE: 0.54 mg/dL — AB (ref 0.61–1.24)
Calcium: 8.3 mg/dL — ABNORMAL LOW (ref 8.9–10.3)
Glucose, Bld: 98 mg/dL (ref 65–99)
Potassium: 3.8 mmol/L (ref 3.5–5.1)
SODIUM: 140 mmol/L (ref 135–145)

## 2015-04-21 LAB — URINALYSIS, ROUTINE W REFLEX MICROSCOPIC
BILIRUBIN URINE: NEGATIVE
GLUCOSE, UA: NEGATIVE mg/dL
HGB URINE DIPSTICK: NEGATIVE
Ketones, ur: NEGATIVE mg/dL
Leukocytes, UA: NEGATIVE
Nitrite: NEGATIVE
PROTEIN: NEGATIVE mg/dL
Specific Gravity, Urine: 1.015 (ref 1.005–1.030)
Urobilinogen, UA: 0.2 mg/dL (ref 0.0–1.0)
pH: 7 (ref 5.0–8.0)

## 2015-04-21 LAB — VANCOMYCIN, TROUGH: Vancomycin Tr: 20 ug/mL (ref 10.0–20.0)

## 2015-04-21 MED ORDER — DEXTROSE 5 % IV SOLN
1.0000 g | Freq: Three times a day (TID) | INTRAVENOUS | Status: DC
  Administered 2015-04-21: 1 g via INTRAVENOUS
  Filled 2015-04-21: qty 1

## 2015-04-21 MED ORDER — FLEET ENEMA 7-19 GM/118ML RE ENEM
1.0000 | ENEMA | Freq: Once | RECTAL | Status: AC
Start: 2015-04-21 — End: 2015-04-21
  Administered 2015-04-21: 1 via RECTAL
  Filled 2015-04-21: qty 1

## 2015-04-21 MED ORDER — METRONIDAZOLE IN NACL 5-0.79 MG/ML-% IV SOLN
500.0000 mg | Freq: Three times a day (TID) | INTRAVENOUS | Status: DC
  Administered 2015-04-21 – 2015-04-23 (×5): 500 mg via INTRAVENOUS
  Filled 2015-04-21 (×6): qty 100

## 2015-04-21 MED ORDER — DEXTROSE 5 % IV SOLN
2.0000 g | Freq: Three times a day (TID) | INTRAVENOUS | Status: DC
  Administered 2015-04-22 – 2015-04-23 (×5): 2 g via INTRAVENOUS
  Filled 2015-04-21 (×6): qty 2

## 2015-04-21 MED ORDER — SODIUM CHLORIDE 0.9 % IV SOLN
INTRAVENOUS | Status: DC
  Administered 2015-04-21 – 2015-04-22 (×3): via INTRAVENOUS

## 2015-04-21 MED ORDER — VANCOMYCIN HCL IN DEXTROSE 750-5 MG/150ML-% IV SOLN
750.0000 mg | Freq: Three times a day (TID) | INTRAVENOUS | Status: DC
  Administered 2015-04-21 – 2015-04-23 (×5): 750 mg via INTRAVENOUS
  Filled 2015-04-21 (×6): qty 150

## 2015-04-21 MED ORDER — AMOXICILLIN-POT CLAVULANATE 875-125 MG PO TABS
1.0000 | ORAL_TABLET | Freq: Two times a day (BID) | ORAL | Status: DC
  Administered 2015-04-21: 1 via ORAL
  Filled 2015-04-21: qty 1

## 2015-04-21 NOTE — Progress Notes (Signed)
Follow Up Note  I was notified by RN that patient was complaining of chills and found to have a temp of 102.2 rectally. I evaluated Perry Nielsen at bedside, feels warm. Thinks his abdomen might be a little more tender. There were no peritoneal signs on exam. Lungs clear. I spoke with Dr Zella Richer of General Surgery.  -Will restart broad spectrum antimicrobial therapy with Vanc and Tressie Ellis -Repeat blood cultures x 2 -Check a CXR and U/A -Restart IV fluids with NS at 100 mL/hour -Give tylenol for his fever.

## 2015-04-21 NOTE — Progress Notes (Signed)
Patient ID: Perry Iha., male   DOB: 11/26/1940, 74 y.o.   MRN: [id number]  [facility]  Subjective: Patient in bed, nurse at bedside.  Complains about needing to have BM.  Having liquid stools.  Mild abdominal pain.  Objective: Vital signs in last 24 hours: Temp:  [97.4 F (36.3 C)-98.2 F (36.8 C)] 97.4 F (36.3 C) (10/16 0524) Pulse Rate:  [82-98] 82 (10/16 0524) Resp:  [18-20] 20 (10/16 0524) BP: (99-110)/(58-61) 110/61 mmHg (10/16 0524) SpO2:  [93 %-97 %] 95 % (10/16 0524) Weight:  [66.4 kg (146 lb 6.2 oz)] 66.4 kg (146 lb 6.2 oz) (10/16 0524) Last BM Date: 04/19/15  Intake/Output from previous day: 10/15 0701 - 10/16 0700 In: 1290 [P.O.:840; IV Piggyback:450] Out: 750 [Urine:750] Intake/Output this shift: Total I/O In: 3 [I.V.:3] Out: -   Physical Exam: HEENT - sclerae clear, mucous membranes moist Neck - soft Chest - clear bilaterally Cor - RRR Abdomen - soft, scaphoid; no mass; mild tender LUQ Ext - no edema, non-tender Neuro - alert & oriented, no focal deficits  Lab Results:   Recent Labs  04/20/15 0515 04/21/15 0535  WBC 17.7* 16.5*  HGB 8.9* 9.5*  HCT 27.0* 29.1*  PLT 285 354   BMET  Recent Labs  04/20/15 0515 04/21/15 0535  NA 134* 140  K 3.2* 3.8  CL 102 106  CO2 24 26  GLUCOSE 96 98  BUN 5* <5*  CREATININE 0.63 0.54*  CALCIUM 7.7* 8.3*   PT/INR No results for input(s): LABPROT, INR in the last 72 hours. Comprehensive Metabolic Panel:    Component Value Date/Time   NA 140 04/21/2015 0535   NA 134* 04/20/2015 0515   NA 137 04/05/2015 0851   NA 140 03/29/2015 1257   K 3.8 04/21/2015 0535   K 3.2* 04/20/2015 0515   K 4.3 04/05/2015 0851   K 3.2* 03/29/2015 1257   CL 106 04/21/2015 0535   CL 102 04/20/2015 0515   CO2 26 04/21/2015 0535   CO2 24 04/20/2015 0515   CO2 19* 04/05/2015 0851   CO2 25 03/29/2015 1257   BUN <5* 04/21/2015 0535   BUN 5* 04/20/2015 0515   BUN 9.3  04/05/2015 0851   BUN 10.9 03/29/2015 1257   CREATININE 0.54* 04/21/2015 0535   CREATININE 0.63 04/20/2015 0515   CREATININE 0.8 04/05/2015 0851   CREATININE 0.9 03/29/2015 1257   GLUCOSE 98 04/21/2015 0535   GLUCOSE 96 04/20/2015 0515   GLUCOSE 120 04/05/2015 0851   GLUCOSE 104 03/29/2015 1257   GLUCOSE 91 04/23/2006 1003   CALCIUM 8.3* 04/21/2015 0535   CALCIUM 7.7* 04/20/2015 0515   CALCIUM 8.8 04/05/2015 0851   CALCIUM 8.3* 03/29/2015 1257   AST 22 04/19/2015 0425   AST 24 04/18/2015 0735   AST 20 04/05/2015 0851   AST 15 03/29/2015 1257   ALT 27 04/19/2015 0425   ALT 34 04/18/2015 0735   ALT 25 04/05/2015 0851   ALT 18 03/29/2015 1257   ALKPHOS 70 04/19/2015 0425   ALKPHOS 74 04/18/2015 0735   ALKPHOS 75 04/05/2015 0851   ALKPHOS 90 03/29/2015 1257   BILITOT 0.7 04/19/2015 0425   BILITOT 0.6 04/18/2015 0735   BILITOT 0.52 04/05/2015 0851   BILITOT 0.38 03/29/2015 1257   PROT 4.8* 04/19/2015 0425   PROT 5.2* 04/18/2015 0735   PROT 5.8* 04/05/2015 0851   PROT 5.6* 03/29/2015 1257   ALBUMIN 2.1* 04/19/2015 0425   ALBUMIN  2.3* 04/18/2015 0735   ALBUMIN 2.6* 04/05/2015 0851   ALBUMIN 2.7* 03/29/2015 1257    Studies/Results: No results found.  Anti-infectives: Anti-infectives    Start     Dose/Rate Route Frequency Ordered Stop   04/18/15 1600  vancomycin (VANCOCIN) IVPB 750 mg/150 ml premix     750 mg 150 mL/hr over 60 Minutes Intravenous Every 8 hours 04/18/15 1459     04/18/15 1400  cefTAZidime (FORTAZ) 2 g in dextrose 5 % 50 mL IVPB     2 g 100 mL/hr over 30 Minutes Intravenous 3 times per day 04/18/15 0515     04/18/15 0230  cefTAZidime (FORTAZ) 2 g in dextrose 5 % 50 mL IVPB     2 g 100 mL/hr over 30 Minutes Intravenous  Once 04/18/15 0220 04/18/15 0500   04/18/15 0130  vancomycin (VANCOCIN) IVPB 1000 mg/200 mL premix     1,000 mg 200 mL/hr over 60 Minutes Intravenous  Once 04/18/15 0118 04/18/15 0500   04/18/15 0130  metroNIDAZOLE (FLAGYL) IVPB 500 mg      500 mg 100 mL/hr over 60 Minutes Intravenous  Once 04/18/15 0118 04/18/15 0340      Assessment & Plans: LUQ fluid collection following lap splenectomy IR aspirated - amylase >10K consistent with pseudocyst Cultures pending, lipase pending - will check  On abx - medical service to convert to oral abx and complete 7 day course Constipation?  Will give Fleets enema this AM for relief  Diarrhea may be related to inflammation LUQ of abdomen  Will need follow up CT abd in 4-6 weeks with follow up at [place] office with Dr. Excell Seltzer.  Earnstine Regal, MD, [facility] Office: [place] 04/21/2015

## 2015-04-21 NOTE — Progress Notes (Signed)
PHARMACY CONSULT NOTE - Follow Up  Pharmacy Consult for Vancomycin Indication: Fever, r/o intra-abdominal infection  Allergies  Allergen Reactions  . Antihistamines, Diphenhydramine-Type     CAUSES PROSTATE TO SWELL & as a child had hyperactivity when took Benadryl Pt took Pepcid w/ Benadryl PO and did fine per Pt & his wife    Patient Measurements: Height: 6' (182.9 cm) Weight: 146 lb 6.2 oz (66.4 kg) IBW/kg (Calculated) : 77.6  Vital Signs: Temp: 102.2 F (39 C) (10/16 1454) Temp Source: Rectal (10/16 1454) BP: 117/76 mmHg (10/16 1443) Pulse Rate: 95 (10/16 1443) Intake/Output from previous day: 10/15 0701 - 10/16 0700 In: 1290 [P.O.:840; IV Piggyback:450] Out: 750 [Urine:750]  Labs:  Recent Labs  04/19/15 0425 04/20/15 0515 04/21/15 0535  WBC 16.5* 17.7* 16.5*  HGB 9.2* 8.9* 9.5*  PLT 282 285 354  CREATININE 0.63 0.63 0.54*   Estimated Creatinine Clearance: 77.2 mL/min (by C-G formula based on Cr of 0.54).  Recent Labs  04/19/15 1420 04/21/15 1553  VANCOTROUGH 13 20     Assessment: 72 yoM with h/o lymphoma undergoing chemotherapy and s/p splenectomy for splenic mass. CAT scan shows extensive inflammation with loculated fluid collection suggesting possible infection or abscess. Ceftazidime and Vancomycin started empirically.  CT-guided aspiration by IR on 10/14 showed amylase c/w pseudocyst and antibiotics were narrowed to Augmentin on 10/16.  Ceftazidime and Flagyl per MD and Vancomycin per pharmacy are now resumed for fever, chills, and increasing abdominal tenderness.  10/13 >> Flagyl x 1 10/13 >> Vanc, resume 10/16 >>  10/13 >> Ceftaz, resume 10/16 >>  10/16>> Augmentin >> 10/16 10/16 >> Flagyl >>  Today, 04/21/2015:  Tmax up to 102.2  WBC remain elevated, 16.5  SCr 0.54 with CrCl ~ 77 ml/min  Vancomycin trough level = 20, therapeutic at upper end of goal range, renal function remains stable.  Goal of Therapy:  Vancomycin trough level 15-20  mcg/ml  Appropriate abx dosing, eradication of infection.   Plan:   Continue Vancomycin 750mg  IV q8h  Measure Vanc trough at steady state.  Follow up renal fxn, culture results, and clinical course.   Gretta Arab PharmD, BCPS Pager [phone] 04/21/2015 3:08 PM

## 2015-04-21 NOTE — Progress Notes (Signed)
[facility] PROGRESS NOTE  Dyanne Iha. ULA:[id number] DOB: [birth year] DOA: 04/17/2015 PCP: Joycelyn Man, MD  Assessment/Plan: 1. 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 -There was concern that this could reflect post splenectomy infectious process. -IR consulted undergoing aspiration on suspected pseudocyst on 04/19/2015. No drain placed.  -He was initially treated with empiric IV antimicrobial therapy with vancomycin and Tressie Ellis -Spoke with Dr Harlow Asa, amylase in fluid elevated. This likely represents pseudocyct. -On 04/21/2015 IV antibiotics stopped, started Augmentin 875 PO BID.  2.  Large B-cell Lymphoma  -He is currently being followed at the cancer center by Dr. Benay Spice, undergoing treatment with CHOP/Rituxan -PET scan performed 02/22/2015 showing numerous hypermetabolic bone lesions throughout the axial and appendicular skeleton as well as hypermetabolic splenic lesion. -Radiology reporting that destructive right sacral metastasis appearing stable -Dr Benay Spice evaluated patient on 04/19/2015   3.  Right foot drop -Likely secondary to nerve compression from right sacral mass  4.  Bilateral extremity DVTs. -Ultrasound performed on 04/18/2015 revealed acute DVT involving the right common femoral vein, right femoral vein, right popliteal vein, right posterior tibial veins, right peroneal veins and left corona veins. -He was started on Lovenox with pharmacy consultation. Lovenox dose held this morning given plans to undergo procedure by interventional radiology. -Likely precipitated by underlying malignancy. -Will need Lovenox teaching.  Code Status: Full  code Family Communication: Spoke to his wife was present at bedside Disposition Plan: Anticipate discharge in the next 24 hours.    Consultants:  General surgery  Medical Oncology  HPI/Subjective: Perry Nielsen is a pleasant 74 year old with a past medical history of large B-cell lymphoma, CD20 positive with previous imaging revealing sacral mass, undergoing splenectomy on 02/23/2015, status post therapy with CHOP/rituximab admitted to the medicine service on 04/18/2015 presenting with complaints of fever, generalized weakness and fatigue. He was found have a temperature 101.9. He was further worked up with a CT scan of abdomen and pelvis that revealed loculated fluid collections in the left upper quadrant largest measuring 6.7 cm. There is concern for this reflecting postsplenectomy infection. He was started on empiric IV antimicrobial therapy with ceftazidime and vancomycin. General surgery was consulted, recommending interventional radiology consultation for possible drain placement.  Objective: Filed Vitals:   04/21/15 0524  BP: 110/61  Pulse: 82  Temp: 97.4 F (36.3 C)  Resp: 20    Intake/Output Summary (Last 24 hours) at 04/21/15 1221 Last data filed at 04/21/15 0851  Gross per 24 hour  Intake   1173 ml  Output    300 ml  Net    873 ml   Filed Weights   04/19/15 0500 04/20/15 0455 04/21/15 0524  Weight: 63.8 kg (140 lb 10.5 oz) 64.2 kg (141 lb 8.6 oz) 66.4 kg (146 lb 6.2 oz)    Exam:   General:  Patient is awake and alert, no acute distress, we ambulated down the hallway.   Cardiovascular: Regular rate and rhythm normal S1-S2 no murmurs rubs or gallops  Respiratory: Normal respiratory effort, lungs are clear to auscultation bilaterally  Abdomen: Patient having pain with deep palpation over the left upper quadrant region  Musculoskeletal: No extremity edema, sarcopenia present  Data Reviewed: Basic Metabolic Panel:  Recent Labs Lab 04/18/15 0025  04/18/15 0735  04/19/15 0425 04/20/15 0515 04/21/15 0535  NA 134* 134* 133* 134* 140  K 3.5 3.4* 3.1* 3.2* 3.8  CL 100* 103 103 102 106  CO2 26 23 24 24 26   GLUCOSE 104* 98 93 96 98  BUN 9 6 6  5* <5*  CREATININE 0.78 0.59* 0.63 0.63 0.54*  CALCIUM 8.5* 7.8* 7.8* 7.7* 8.3*   Liver Function Tests:  Recent Labs Lab 04/18/15 0025 04/18/15 0735 04/19/15 0425  AST 24 24 22   ALT 37 34 27  ALKPHOS 76 74 70  BILITOT 0.7 0.6 0.7  PROT 5.5* 5.2* 4.8*  ALBUMIN 2.5* 2.3* 2.1*    Recent Labs Lab 04/18/15 0735  LIPASE 60*  AMYLASE 167*   No results for input(s): AMMONIA in the last 168 hours. CBC:  Recent Labs Lab 04/18/15 0025 04/18/15 0735 04/19/15 0425 04/20/15 0515 04/21/15 0535  WBC 11.9* 13.3* 16.5* 17.7* 16.5*  NEUTROABS 10.0* 11.6*  --   --   --   HGB 10.0* 10.0* 9.2* 8.9* 9.5*  HCT 29.6* 29.5* 27.7* 27.0* 29.1*  MCV 89.2 88.9 88.5 88.2 89.0  PLT 239 270 282 285 354   Cardiac Enzymes: No results for input(s): CKTOTAL, CKMB, CKMBINDEX, TROPONINI in the last 168 hours. BNP (last 3 results) No results for input(s): BNP in the last 8760 hours.  ProBNP (last 3 results) No results for input(s): PROBNP in the last 8760 hours.  CBG: No results for input(s): GLUCAP in the last 168 hours.  Recent Results (from the past 240 hour(s))  Blood Culture (routine x 2)     Status: None (Preliminary result)   Collection Time: 04/18/15 12:21 AM  Result Value Ref Range Status   Specimen Description BLOOD RIGHT ANTECUBITAL  Final   Special Requests BOTTLES DRAWN AEROBIC AND ANAEROBIC 5ML  Final   Culture   Final    NO GROWTH 2 DAYS Performed at [facility]    Report Status PENDING  Incomplete  Blood Culture (routine x 2)     Status: None (Preliminary result)   Collection Time: 04/18/15 12:24 AM  Result Value Ref Range Status   Specimen Description BLOOD LEFT ANTECUBITAL  Final   Special Requests BOTTLES DRAWN AEROBIC AND ANAEROBIC 5ML  Final   Culture   Final    NO GROWTH 2  DAYS Performed at [facility]    Report Status PENDING  Incomplete  Urine culture     Status: None   Collection Time: 04/18/15  3:41 AM  Result Value Ref Range Status   Specimen Description URINE, CLEAN CATCH  Final   Special Requests NONE  Final   Culture   Final    NO GROWTH 1 DAY Performed at [facility]    Report Status 04/19/2015 FINAL  Final  C difficile quick scan w PCR reflex     Status: None   Collection Time: 04/19/15 11:22 AM  Result Value Ref Range Status   C Diff antigen NEGATIVE NEGATIVE Final   C Diff toxin NEGATIVE NEGATIVE Final   C Diff interpretation Negative for toxigenic C. difficile  Final  Anaerobic culture     Status: None (Preliminary result)   Collection Time: 04/19/15  5:21 PM  Result Value Ref Range Status   Specimen Description ABSCESS  Final   Special Requests Normal  Final   Gram Stain PENDING  Incomplete   Culture   Final    NO ANAEROBES ISOLATED; CULTURE IN PROGRESS FOR 5 DAYS Performed  at [facility]    Report Status PENDING  Incomplete     Studies: No results found.  Scheduled Meds: . amoxicillin-clavulanate  1 tablet Oral Q12H  . antiseptic oral rinse  7 mL Mouth Rinse q12n4p  . chlorhexidine  15 mL Mouth Rinse BID  . enoxaparin (LOVENOX) injection  1 mg/kg Subcutaneous Q12H  . famotidine  20 mg Oral Daily  . sodium chloride  3 mL Intravenous Q12H  . traZODone  100 mg Oral QHS   Continuous Infusions:    Principal Problem:   Intra-abdominal abscess (HCC) Active Problems:   DVT (deep venous thrombosis) (HCC)   GERD   Foot drop   Spine metastasis (HCC)   Lymphoma, large cell ([place])   Sepsis ([place])   Abdominal fluid collection    Time spent: 15 min    Kelvin Cellar  [facility] Pager [phone]. If 7PM-7AM, please contact night-coverage at [email or web address], password [facility] 04/21/2015, 12:21 PM  LOS: 3 days

## 2015-04-22 DIAGNOSIS — M21371 Foot drop, right foot: Secondary | ICD-10-CM

## 2015-04-22 DIAGNOSIS — K863 Pseudocyst of pancreas: Principal | ICD-10-CM

## 2015-04-22 LAB — COMPREHENSIVE METABOLIC PANEL
ALT: 22 U/L (ref 17–63)
ANION GAP: 8 (ref 5–15)
AST: 25 U/L (ref 15–41)
Albumin: 2.2 g/dL — ABNORMAL LOW (ref 3.5–5.0)
Alkaline Phosphatase: 61 U/L (ref 38–126)
CHLORIDE: 104 mmol/L (ref 101–111)
CO2: 26 mmol/L (ref 22–32)
Calcium: 8.1 mg/dL — ABNORMAL LOW (ref 8.9–10.3)
Creatinine, Ser: 0.6 mg/dL — ABNORMAL LOW (ref 0.61–1.24)
GFR calc Af Amer: >60 mL/min (ref >60)
Glucose, Bld: 99 mg/dL (ref 65–99)
POTASSIUM: 3.5 mmol/L (ref 3.5–5.1)
Sodium: 138 mmol/L (ref 135–145)
TOTAL PROTEIN: 5 g/dL — AB (ref 6.5–8.1)
Total Bilirubin: 0.5 mg/dL (ref 0.3–1.2)

## 2015-04-22 LAB — CBC
HCT: 29.4 % — ABNORMAL LOW (ref 39.0–52.0)
Hemoglobin: 9.7 g/dL — ABNORMAL LOW (ref 13.0–17.0)
MCH: 29.8 pg (ref 26.0–34.0)
MCHC: 33 g/dL (ref 30.0–36.0)
MCV: 90.2 fL (ref 78.0–100.0)
PLATELETS: 379 10*3/uL (ref 150–400)
RBC: 3.26 MIL/uL — ABNORMAL LOW (ref 4.22–5.81)
RDW: 16.7 % — AB (ref 11.5–15.5)
WBC: 15.2 10*3/uL — ABNORMAL HIGH (ref 4.0–10.5)

## 2015-04-22 MED ORDER — HYDROCERIN EX CREA
TOPICAL_CREAM | Freq: Two times a day (BID) | CUTANEOUS | Status: DC
  Administered 2015-04-23: 08:00:00 via TOPICAL
  Filled 2015-04-22 (×2): qty 113

## 2015-04-22 MED ORDER — ENOXAPARIN (LOVENOX) PATIENT EDUCATION KIT
PACK | Freq: Once | Status: AC
  Administered 2015-04-22: 07:00:00
  Filled 2015-04-22: qty 1

## 2015-04-22 NOTE — Progress Notes (Signed)
General Surgery Note  LOS: 4 days  POD -     Assessment/Plan: 1.  Pancreatitic psuedocyst  On Flagyl/Vanc  Possibly home tomorrow.  Will need follow up with Dr. Excell Seltzer post discharge.  2.  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  Lymphoma, large B-cell  Followed by Dr. Benay Spice - original diagnosis in Aug 2016.  Has numerous hypermetabolic bone lesions throughout the axial skeleton   4.  Right foot drop 5.  Has bilateral DVT - on Lovenox    Principal Problem:   Intra-abdominal abscess (HCC) Active Problems:   GERD   Foot drop   Spine metastasis (HCC)   Lymphoma, large cell ([place])   Sepsis ([place])   DVT (deep venous thrombosis) (HCC)   Abdominal fluid collection   Subjective:  Sore LUQ, but feels okay.  He said that the last 2 months have been rough.  Objective:   Filed Vitals:   04/22/15 0452  BP: 94/64  Pulse: 94  Temp: 98.8 F (37.1 C)  Resp: 20     Intake/Output from previous day:  10/16 0701 - 10/17 0700 In: 2683 [P.O.:600; I.V.:1483; IV Piggyback:600] Out: -   Intake/Output this shift:      Physical Exam:   General: Older bald WM who is alert and oriented.    HEENT: Normal. Pupils equal. .   Lungs: clear   Abdomen: Soft.  No mass that I can feel.\   Lab Results:    Recent Labs  04/21/15 0535 04/22/15 0505  WBC 16.5* 15.2*  HGB 9.5* 9.7*  HCT 29.1* 29.4*  PLT 354 379    BMET   Recent Labs  04/21/15 0535 04/22/15 0505  NA 140 138  K 3.8 3.5  CL 106 104  CO2 26 26  GLUCOSE 98 99  BUN <5* <5*  CREATININE 0.54* 0.60*  CALCIUM 8.3* 8.1*    PT/INR  No results for input(s): LABPROT, INR in the last 72 hours.  ABG  No results for input(s): PHART, HCO3 in the last 72 hours.  Invalid input(s): PCO2, PO2   Studies/Results:  Dg Chest 2 View  04/21/2015  CLINICAL DATA:  Fever, Chills, generalized weakness, fatigue, onset today. H/o lymphoma and skin cancer. Nonsmoker. EXAM: CHEST   2 VIEW COMPARISON:  04/18/2015 FINDINGS: The heart size and vascular pattern are normal. There is mild hazy opacity in the left lung base. This is a subtle difference when compared the prior study. There is a tiny left pleural effusion. Compression deformity mid thoracic spine stable. IMPRESSION: Tiny left pleural effusion with mild left lower lobe atelectasis Electronically Signed   By: Skipper Cliche M.D.   On: 04/21/2015 16:01     Anti-infectives:   Anti-infectives    Start     Dose/Rate Route Frequency Ordered Stop   04/22/15 0000  cefTAZidime (FORTAZ) 2 g in dextrose 5 % 50 mL IVPB     2 g 100 mL/hr over 30 Minutes Intravenous Every 8 hours 04/21/15 1740     04/21/15 2000  metroNIDAZOLE (FLAGYL) IVPB 500 mg     500 mg 100 mL/hr over 60 Minutes Intravenous Every 8 hours 04/21/15 1741     04/21/15 1800  vancomycin (VANCOCIN) IVPB 750 mg/150 ml premix     750 mg 150 mL/hr over 60 Minutes Intravenous Every 8 hours 04/21/15 1732     04/21/15 1600  cefTAZidime (FORTAZ) 1 g in dextrose 5 % 50 mL IVPB  Status:  Discontinued     1 g 100 mL/hr over 30 Minutes Intravenous Every 8 hours 04/21/15 1456 04/21/15 1740   04/21/15 1000  amoxicillin-clavulanate (AUGMENTIN) 875-125 MG per tablet 1 tablet  Status:  Discontinued     1 tablet Oral Every 12 hours 04/21/15 0931 04/21/15 1456   04/18/15 1600  vancomycin (VANCOCIN) IVPB 750 mg/150 ml premix  Status:  Discontinued     750 mg 150 mL/hr over 60 Minutes Intravenous Every 8 hours 04/18/15 1459 04/21/15 0930   04/18/15 1400  cefTAZidime (FORTAZ) 2 g in dextrose 5 % 50 mL IVPB  Status:  Discontinued     2 g 100 mL/hr over 30 Minutes Intravenous 3 times per day 04/18/15 0515 04/21/15 0930   04/18/15 0230  cefTAZidime (FORTAZ) 2 g in dextrose 5 % 50 mL IVPB     2 g 100 mL/hr over 30 Minutes Intravenous  Once 04/18/15 0220 04/18/15 0500   04/18/15 0130  vancomycin (VANCOCIN) IVPB 1000 mg/200 mL premix     1,000 mg 200 mL/hr over 60 Minutes  Intravenous  Once 04/18/15 0118 04/18/15 0500   04/18/15 0130  metroNIDAZOLE (FLAGYL) IVPB 500 mg     500 mg 100 mL/hr over 60 Minutes Intravenous  Once 04/18/15 0118 04/18/15 0340      Alphonsa Overall, MD, FACS Pager: [facility] Office: [phone] 04/22/2015

## 2015-04-22 NOTE — Evaluation (Signed)
Physical Therapy Evaluation Patient Details Name: Perry Nielsen. MRN: [id number] DOB: 12/30/1940 Today's Date: 04/22/2015   History of Present Illness  74 yo male admitted with abdominal abscess. Hx of lymphoma as well as a sacral mass likely causing R foot drop.   Clinical Impression  On eval, pt was Min guard assist for mobility-walked ~75 feet with RW. Pt still fatigued from earlier walk in hallway but agreeable to working with PT again. Recommend pt resume HHPT at discharge.     Follow Up Recommendations Home health PT    Equipment Recommendations  None recommended by PT    Recommendations for Other Services       Precautions / Restrictions Precautions Precautions: Fall Precaution Comments: R foot drop Required Braces or Orthoses:  (pt is currently wearing PRAFO on R foot. Explained to pt/wife that it isn't designed to use while ambulating however pt insists on using it while ambulating since swelling is preventing use of R shoe/AFO) Restrictions Weight Bearing Restrictions: No      Mobility  Bed Mobility Overal bed mobility: Modified Independent                Transfers Overall transfer level: Needs assistance Equipment used: Rolling walker (2 wheeled) Transfers: Sit to/from Stand Sit to Stand: Supervision         General transfer comment: for safety  Ambulation/Gait Ambulation/Gait assistance: Min guard Ambulation Distance (Feet): 75 Feet Assistive device: Rolling walker (2 wheeled) Gait Pattern/deviations: Step-to pattern;Decreased dorsiflexion - right;Trunk flexed;Step-through pattern     General Gait Details: very close guarding. One instance of R LE buckling noted. Pt fatigues fairly quickly.  Stairs            Wheelchair Mobility    Modified Rankin (Stroke Patients Only)       Balance Overall balance assessment: Needs assistance         Standing balance support: During functional activity;Bilateral upper extremity  supported Standing balance-Leahy Scale: Fair                               Pertinent Vitals/Pain Pain Assessment: No/denies pain    Home Living Family/patient expects to be discharged to:: Private residence Living Arrangements: Spouse/significant other Available Help at Discharge: Family Type of Home: House Home Access: Stairs to enter   Technical brewer of Steps: 2 Home Layout: Two level;Bed/bath upstairs Home Equipment: Walker - standard;Cane - single point;Shower seat;Grab bars - tub/shower      Prior Function Level of Independence: Independent with assistive device(s)               Hand Dominance        Extremity/Trunk Assessment   Upper Extremity Assessment: Generalized weakness           Lower Extremity Assessment: Generalized weakness;RLE deficits/detail RLE Deficits / Details: 1+/5 dorsiflexion, presents with functional foot drop    Cervical / Trunk Assessment: Kyphotic  Communication   Communication: No difficulties  Cognition Arousal/Alertness: Awake/alert Behavior During Therapy: WFL for tasks assessed/performed Overall Cognitive Status: Within Functional Limits for tasks assessed                      General Comments      Exercises Other Exercises Other Exercises: encouraged DF/PF exercises when able.       Assessment/Plan    PT Assessment Patient needs continued PT services  PT Diagnosis Difficulty walking  PT Problem List Decreased strength;Decreased activity tolerance;Decreased balance;Decreased mobility;Decreased knowledge of use of DME  PT Treatment Interventions DME instruction;Gait training;Functional mobility training;Therapeutic activities;Patient/family education;Balance training;Therapeutic exercise   PT Goals (Current goals can be found in the Care Plan section) Acute Rehab PT Goals Patient Stated Goal: feel better, stronger PT Goal Formulation: With patient/family Time For Goal Achievement:  05/06/15 Potential to Achieve Goals: Good    Frequency Min 3X/week   Barriers to discharge        Co-evaluation               End of Session Equipment Utilized During Treatment: Gait belt Activity Tolerance: Patient tolerated treatment well Patient left: in bed;with call bell/phone within reach;with family/visitor present           Time: 6644-0347 PT Time Calculation (min) (ACUTE ONLY): 25 min   Charges:   PT Evaluation $Initial PT Evaluation Tier I: 1 Procedure     PT G Codes:        Weston Anna, MPT Pager: [phone]

## 2015-04-22 NOTE — Care Management Note (Signed)
Case Management Note  Patient Details  Name: Kamar Callender. MRN: [id number] Date of Birth: [birth year]  Subjective/Objective: Spoke to patient about d/c plans.  He has PT coming to house but unsure what agency, await his spouse to arrive to  [facility] agency.  Left [place] agency list in rm-await choice.  Patient states his wife will be taught lovenox injections, & she manages his meds, & appts.  He currently doesn't feel HHRN/OT/HH nurse's aide may be needed & these services has been ordered.  Will confirm HHC needs w/attending/patient/spouse.Patient already has rw.                   Action/Plan:d/c plan home.   Expected Discharge Date:                  Expected Discharge Plan:  [place]  In-House Referral:     Discharge planning Services  CM Consult  Post Acute Care Choice:    Choice offered to:  Patient  DME Arranged:    DME Agency:     HH Arranged:    [place] Agency:     Status of Service:  In process, will continue to follow  Medicare Important Message Given:  Yes-second notification given Date Medicare IM Given:    Medicare IM give by:    Date Additional Medicare IM Given:    Additional Medicare Important Message give by:     If discussed at [place] of Stay Meetings, dates discussed:    Additional Comments:  [name], RN 04/22/2015, 11:14 AM

## 2015-04-22 NOTE — Care Management Important Message (Signed)
Important Message  Patient Details  Name: Steffon Gladu. MRN: [id number] Date of Birth: [birth year]   Medicare Important Message Given:  Yes-third notification given    Camillo Flaming 04/22/2015, 12:19 [place] Message  Patient Details  Name: Harold Moncus. MRN: [id number] Date of Birth: October 13, 1940   Medicare Important Message Given:  Yes-third notification given    Camillo Flaming 04/22/2015, 12:18 PM

## 2015-04-22 NOTE — Progress Notes (Signed)
Wife demonstrated correct technique for administering lovenox injection. Patient and wife given lovenox teaching kit to take home. Patient and wife answered questions regarding lovenox therapy appropriately. Will continue to be available to answer questions as they arise.

## 2015-04-22 NOTE — Progress Notes (Signed)
[facility] PROGRESS NOTE  Dyanne Iha. RFF:[id number] DOB: Sep 25, 1940 DOA: 04/17/2015 PCP: Joycelyn Man, MD  Assessment/Plan: 1. 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 -There was concern that this could reflect post splenectomy infectious process. -IR consulted undergoing aspiration on suspected pseudocyst on 04/19/2015. No drain placed.  -He was initially treated with empiric IV antimicrobial therapy with vancomycin and Tressie Ellis -Spoke with Dr Harlow Asa, amylase in fluid elevated. This likely represents pseudocyct. -He spiked a temperature on 04/21/2015 of 102.2 taken rectally. He was restarted on emperic IV antibiotic therapy with IV Vancomycin and Flagyl. He was further worked up with a CXR and U/A which did not show evidence of infection.  -Has remained afebrile since last temp spike. Spoke with surgery could be related to pseudocyst.  -If he remains afebrile anticpate discharge in the next 24 hours.   2.  Large B-cell Lymphoma  -He is currently being followed at the cancer center by Dr. Benay Spice, undergoing treatment with CHOP/Rituxan -PET scan performed 02/22/2015 showing numerous hypermetabolic bone lesions throughout the axial and appendicular skeleton as well as hypermetabolic splenic lesion. -Radiology reporting that destructive right sacral metastasis appearing stable -Dr Benay Spice following. May need to reassess chemotherapy that has been scheduled for this Friday.   3.  Right foot drop -Likely secondary to nerve compression from right sacral mass  4.  Bilateral extremity DVTs. -Ultrasound performed on 04/18/2015 revealed acute DVT involving the right  common femoral vein, right femoral vein, right popliteal vein, right posterior tibial veins, right peroneal veins and left corona veins. -He was started on Lovenox with pharmacy consultation. Lovenox dose held this morning given plans to undergo procedure by interventional radiology. -Likely precipitated by underlying malignancy. -Lovenox teaching provided to his wife.   Code Status: Full code Family Communication: Spoke to his wife was present at bedside Disposition Plan: Anticipate discharge in the next 24 hours.    Consultants:  General surgery  Medical Oncology  HPI/Subjective: Perry Nielsen is a pleasant 74 year old with a past medical history of large B-cell lymphoma, CD20 positive with previous imaging revealing sacral mass, undergoing splenectomy on 02/23/2015, status post therapy with CHOP/rituximab admitted to the medicine service on 04/18/2015 presenting with complaints of fever, generalized weakness and fatigue. He was found have a temperature 101.9. He was further worked up with a CT scan of abdomen and pelvis that revealed loculated fluid collections in the left upper quadrant largest measuring 6.7 cm. There is concern for this reflecting postsplenectomy infection. He was started on empiric IV antimicrobial therapy with ceftazidime and vancomycin. General surgery was consulted, recommending interventional radiology consultation for possible drain placement.  Objective: Filed Vitals:   04/22/15 0452  BP: 94/64  Pulse: 94  Temp: 98.8 F (37.1 C)  Resp: 20    Intake/Output Summary (Last 24 hours) at 04/22/15 1228 Last data filed at 04/22/15 0700  Gross per 24 hour  Intake   2680 ml  Output      0 ml  Net   2680 ml   Filed Weights   04/20/15 0455 04/21/15 0524 04/22/15 0452  Weight: 64.2 kg (141 lb 8.6 oz) 66.4 kg (146 lb 6.2 oz) 66.5 kg (146 lb 9.7 oz)    Exam:   General:  Patient is awake and alert, no acute distress, he states feeling better today.    Cardiovascular: Regular rate and rhythm normal S1-S2 no murmurs rubs or gallops  Respiratory: Normal respiratory effort, lungs are clear to auscultation bilaterally  Abdomen: Patient having pain with deep palpation over the left upper quadrant region  Musculoskeletal: No extremity edema, sarcopenia present  Data Reviewed: Basic Metabolic Panel:  Recent Labs Lab 04/18/15 0735 04/19/15 0425 04/20/15 0515 04/21/15 0535 04/22/15 0505  NA 134* 133* 134* 140 138  K 3.4* 3.1* 3.2* 3.8 3.5  CL 103 103 102 106 104  CO2 23 24 24 26 26   GLUCOSE 98 93 96 98 99  BUN 6 6 5* <5* <5*  CREATININE 0.59* 0.63 0.63 0.54* 0.60*  CALCIUM 7.8* 7.8* 7.7* 8.3* 8.1*   Liver Function Tests:  Recent Labs Lab 04/18/15 0025 04/18/15 0735 04/19/15 0425 04/22/15 0505  AST 24 24 22 25   ALT 37 34 27 22  ALKPHOS 76 74 70 61  BILITOT 0.7 0.6 0.7 0.5  PROT 5.5* 5.2* 4.8* 5.0*  ALBUMIN 2.5* 2.3* 2.1* 2.2*    Recent Labs Lab 04/18/15 0735  LIPASE 60*  AMYLASE 167*   No results for input(s): AMMONIA in the last 168 hours. CBC:  Recent Labs Lab 04/18/15 0025 04/18/15 0735 04/19/15 0425 04/20/15 0515 04/21/15 0535 04/22/15 0505  WBC 11.9* 13.3* 16.5* 17.7* 16.5* 15.2*  NEUTROABS 10.0* 11.6*  --   --   --   --   HGB 10.0* 10.0* 9.2* 8.9* 9.5* 9.7*  HCT 29.6* 29.5* 27.7* 27.0* 29.1* 29.4*  MCV 89.2 88.9 88.5 88.2 89.0 90.2  PLT 239 270 282 285 354 379   Cardiac Enzymes: No results for input(s): CKTOTAL, CKMB, CKMBINDEX, TROPONINI in the last 168 hours. BNP (last 3 results) No results for input(s): BNP in the last 8760 hours.  ProBNP (last 3 results) No results for input(s): PROBNP in the last 8760 hours.  CBG: No results for input(s): GLUCAP in the last 168 hours.  Recent Results (from the past 240 hour(s))  Blood Culture (routine x 2)     Status: None (Preliminary result)   Collection Time: 04/18/15 12:21 AM  Result Value Ref Range Status   Specimen Description BLOOD RIGHT  ANTECUBITAL  Final   Special Requests BOTTLES DRAWN AEROBIC AND ANAEROBIC 5ML  Final   Culture   Final    NO GROWTH 3 DAYS Performed at [facility]    Report Status PENDING  Incomplete  Blood Culture (routine x 2)     Status: None (Preliminary result)   Collection Time: 04/18/15 12:24 AM  Result Value Ref Range Status   Specimen Description BLOOD LEFT ANTECUBITAL  Final   Special Requests BOTTLES DRAWN AEROBIC AND ANAEROBIC 5ML  Final   Culture   Final    NO GROWTH 3 DAYS Performed at [facility]    Report Status PENDING  Incomplete  Urine culture     Status: None   Collection Time: 04/18/15  3:41 AM  Result Value Ref Range Status   Specimen Description URINE, CLEAN CATCH  Final   Special Requests NONE  Final   Culture   Final    NO GROWTH 1 DAY Performed at [facility]    Report Status 04/19/2015 FINAL  Final  C difficile quick scan w PCR reflex     Status: None   Collection Time: 04/19/15 11:22 AM  Result Value Ref Range Status   C Diff  antigen NEGATIVE NEGATIVE Final   C Diff toxin NEGATIVE NEGATIVE Final   C Diff interpretation Negative for toxigenic C. difficile  Final  Culture, routine-abscess     Status: None (Preliminary result)   Collection Time: 04/19/15  5:21 PM  Result Value Ref Range Status   Specimen Description ABSCESS  Final   Special Requests Normal  Final   Gram Stain   Final    NO WBC SEEN NO ORGANISMS SEEN Performed at [facility]    Culture   Final    NO GROWTH 2 DAYS Performed at [facility]    Report Status PENDING  Incomplete  Anaerobic culture     Status: None (Preliminary result)   Collection Time: 04/19/15  5:21 PM  Result Value Ref Range Status   Specimen Description ABSCESS  Final   Special Requests Normal  Final   Gram Stain PENDING  Incomplete   Culture   Final    NO ANAEROBES ISOLATED; CULTURE IN PROGRESS FOR 5 DAYS Performed at [facility]    Report Status PENDING  Incomplete      Studies: Dg Chest 2 View  04/21/2015  CLINICAL DATA:  Fever, Chills, generalized weakness, fatigue, onset today. H/o lymphoma and skin cancer. Nonsmoker. EXAM: CHEST  2 VIEW COMPARISON:  04/18/2015 FINDINGS: The heart size and vascular pattern are normal. There is mild hazy opacity in the left lung base. This is a subtle difference when compared the prior study. There is a tiny left pleural effusion. Compression deformity mid thoracic spine stable. IMPRESSION: Tiny left pleural effusion with mild left lower lobe atelectasis Electronically Signed   By: Skipper Cliche M.D.   On: 04/21/2015 16:01    Scheduled Meds: . cefTAZidime (FORTAZ)  IV  2 g Intravenous Q8H  . enoxaparin (LOVENOX) injection  1 mg/kg Subcutaneous Q12H  . famotidine  20 mg Oral Daily  . metronidazole  500 mg Intravenous Q8H  . sodium chloride  3 mL Intravenous Q12H  . traZODone  100 mg Oral QHS  . vancomycin  750 mg Intravenous Q8H   Continuous Infusions: . sodium chloride 100 mL/hr at 04/22/15 0303    Principal Problem:   Intra-abdominal abscess (HCC) Active Problems:   DVT (deep venous thrombosis) (HCC)   GERD   Foot drop   Spine metastasis (HCC)   Lymphoma, large cell ([place])   Sepsis ([place])   Abdominal fluid collection    Time spent: 20 min    Kelvin Cellar  [facility] Pager [phone]. If 7PM-7AM, please contact night-coverage at [email or web address], password [email or web address] 04/22/2015, 12:28 PM  LOS: 4 days

## 2015-04-22 NOTE — Progress Notes (Signed)
Lovenox teaching done with patient's wife. She said she wanted to watch this time, after Lovenox given wife verbalized understanding and felt comfortable giving him the next dose he gets.

## 2015-04-22 NOTE — Progress Notes (Signed)
ANTICOAGULATION CONSULT NOTE - follow-up  Pharmacy Consult for Lovenox Indication: DVT  Allergies  Allergen Reactions  . Antihistamines, Diphenhydramine-Type     CAUSES PROSTATE TO SWELL & as a child had hyperactivity when took Benadryl Pt took Pepcid w/ Benadryl PO and did fine per Pt & his wife    Patient Measurements: Height: 6' (182.9 cm) Weight: 146 lb 9.7 oz (66.5 kg) IBW/kg (Calculated) : 77.6   Vital Signs: Temp: 98.8 F (37.1 C) (10/17 0452) Temp Source: Oral (10/17 0452) BP: 94/64 mmHg (10/17 0452) Pulse Rate: 94 (10/17 0452)  Labs:  Recent Labs  04/20/15 0515 04/21/15 0535 04/22/15 0505  HGB 8.9* 9.5* 9.7*  HCT 27.0* 29.1* 29.4*  PLT 285 354 379  CREATININE 0.63 0.54* 0.60*    Estimated Creatinine Clearance: 77.4 mL/min (by C-G formula based on Cr of 0.6).   Medical History: Past Medical History  Diagnosis Date  . 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 ALLERGIC RHINITIS 08/03/2007  . GERD 02/18/2007  . KNEE PAIN, LEFT, CHRONIC 08/07/2008  . BACK PAIN 09/18/2009  . HAMMER TOE 08/03/2007  . CAROTID BRUIT, LEFT 08/07/2008  . 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 Foot drop     rt foot  . Rash, skin     torso  . Hepatitis 1977    no residual problems  . Sleeping difficulty   . Skin cancer   . Lymphoma ([place])     Medications:  Scheduled:  . antiseptic oral rinse  7 mL Mouth Rinse q12n4p  . cefTAZidime (FORTAZ)  IV  2 g Intravenous Q8H  . chlorhexidine  15 mL Mouth Rinse BID  . enoxaparin (LOVENOX) injection  1 mg/kg Subcutaneous Q12H  . enoxaparin   Does not apply Once  . famotidine  20 mg Oral Daily  . metronidazole  500 mg Intravenous Q8H  . sodium chloride  3 mL Intravenous Q12H  . traZODone  100 mg Oral QHS  . vancomycin  750 mg Intravenous Q8H   Infusions:  . sodium chloride 100 mL/hr at 04/22/15 0303   PRN: acetaminophen **OR** acetaminophen, fluticasone, LORazepam, ondansetron  **OR** ondansetron (ZOFRAN) IV, oxymetazoline  Assessment: 74 y.o. male with Past medical history of lymphoma undergoing chemotherapy, splenic mass status post splenectomy, GERD. The patient is presenting with complaints of fatigue with poor oral intake as well as weight loss. CAT scan shows he has extensive inflammation with loculated fluid collection suggesting possible infection or abscess.  Found to have BLE DVT, started on full-dose enoxaparin.    Today, 04/22/2015 Renal: SCr WNL, CrCl > 38ml/min CBC: Hgb low but stable, pltc WNL Weight - stable  Goal of Therapy:  Anti-Xa level 0.6-1 units/ml 4hrs after LMWH dose given  Monitor platelet by anticoagulation protocol: Yes  Plan:   Continue current dose of enoxaparin 65mg  SQ q12h  Alertnatively, can change him to enoxaparin 100mg  SQ q24h at discharge (Hesitant to do so currently d/t increase abd tenderness and fever)  CBC at minimum q72h while on full dose enoxaparin in hospital   Doreene Eland, PharmD, BCPS.   Pager: [phone]  04/22/2015,7:34 AM

## 2015-04-23 LAB — CULTURE, ROUTINE-ABSCESS
CULTURE: NO GROWTH
GRAM STAIN: NONE SEEN
Special Requests: NORMAL

## 2015-04-23 LAB — CULTURE, BLOOD (ROUTINE X 2)
CULTURE: NO GROWTH
CULTURE: NO GROWTH

## 2015-04-23 LAB — CBC
HCT: 29.5 % — ABNORMAL LOW (ref 39.0–52.0)
Hemoglobin: 9.6 g/dL — ABNORMAL LOW (ref 13.0–17.0)
MCH: 29 pg (ref 26.0–34.0)
MCHC: 32.5 g/dL (ref 30.0–36.0)
MCV: 89.1 fL (ref 78.0–100.0)
PLATELETS: 403 10*3/uL — AB (ref 150–400)
RBC: 3.31 MIL/uL — ABNORMAL LOW (ref 4.22–5.81)
RDW: 16.7 % — AB (ref 11.5–15.5)
WBC: 11 10*3/uL — AB (ref 4.0–10.5)

## 2015-04-23 LAB — VANCOMYCIN, TROUGH: VANCOMYCIN TR: 20 ug/mL (ref 10.0–20.0)

## 2015-04-23 MED ORDER — ENOXAPARIN SODIUM 100 MG/ML ~~LOC~~ SOLN: 100.0000 mg | SUBCUTANEOUS | Status: DC

## 2015-04-23 MED ORDER — AMOXICILLIN-POT CLAVULANATE 875-125 MG PO TABS: 1.0000 | ORAL_TABLET | Freq: Two times a day (BID) | ORAL | Status: DC

## 2015-04-23 NOTE — Discharge Summary (Signed)
Physician Discharge Summary  Dyanne Iha. RWE:[id number] DOB: 1941/05/18 DOA: 04/17/2015  PCP: Joycelyn Man, MD  Admit date: 04/17/2015 Discharge date: 04/23/2015  Time spent: 35 minutes  Recommendations for Outpatient Follow-up:  1. Patient was discharged on Lovenox 100 mg [place] q daily as he was diagnosed with bilateral lower extremity DVT's.  2. Follow up on BMP and CBC on hospital follow up 3. Prior to discharge he was set up with home health services.   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 is a 74 y.o. male with Past medical history of lymphoma undergoing chemotherapy, splenic mass status post splenectomy, GERD . The patient is presenting with complaints of fatigue with poor oral intake as well as weight loss. His ongoing since last one week. No diarrhea no constipation. Next and no burning urination. Patient has chronic congestion. No cough no chest pain or abdominal pain. No dizziness no lightheadedness no focal deficit. On examination patient did have significant pain on the left side of the stomach which he mentions has been present for a while.  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 He was found have a temperature 101.9. He was further worked up with a CT scan of abdomen and pelvis that revealed loculated fluid collections in the left upper quadrant largest measuring 6.7 cm. There is concern for this reflecting postsplenectomy infection. He was started on empiric IV antimicrobial therapy with ceftazidime and vancomycin. General surgery was consulted, recommending interventional radiology consultation. ON 04/19/2015 he underwent aspiration of left upper quadrant fluid collection which had brackish appearance. No drain was left in place. Spoke with General Surgery felt that this represented a pseudocyst that than abscess collection. Labs showed an elevated amylase. Other issues addressed during this hospitalization include lower extremity DVT on bilateral lower extremities. He was started on Lovenox therapy.      Left upper quadrant Fluid collection -Mr Brodowski has a history of large B-cell lymphoma undergoing splenectomy on 02/23/2015 -He presented with fevers, general weakness, fatigue, vitals showing a temperature of 101.9 -Further workup included a CT scan of abdomen and pelvis that showed likely the fluid collections involving the left upper quadrant. -There was concern that this could reflect post splenectomy infectious process. -IR consulted undergoing aspiration on suspected pseudocyst on 04/19/2015. No drain placed.  -He was initially treated with empiric IV antimicrobial therapy with vancomycin and Tressie Ellis -Spoke with Dr Harlow Asa, amylase in fluid elevated. This likely represents pseudocyct. -He spiked a temperature on 04/21/2015 of 102.2 taken rectally. He was restarted on emperic IV antibiotic therapy with IV Vancomycin and Flagyl. He was further worked up with a CXR and U/A which did not show evidence of infection.  -Has remained afebrile  since last temp spike. Spoke with surgery could be related to pseudocyst.  -Plan to discharge on a 5 day course of  augmentin  2. Large B-cell Lymphoma  -He is currently being followed at the cancer center by Dr. Benay Spice, undergoing treatment with CHOP/Rituxan -PET scan performed 02/22/2015 showing numerous hypermetabolic bone lesions throughout the axial and appendicular skeleton as well as hypermetabolic splenic lesion. -Radiology reporting that destructive right sacral metastasis appearing stable -Dr Benay Spice following. May need to reassess chemotherapy that has been scheduled for this Friday.   3. Right foot drop -Likely secondary to nerve compression from right sacral mass  4. Bilateral extremity DVTs. -Ultrasound performed on 04/18/2015 revealed acute DVT involving the right common femoral vein, right femoral vein, right popliteal vein, right posterior tibial veins, right peroneal veins and left corona veins. -He was started on Lovenox with pharmacy consultation. Lovenox dose held this morning given plans to undergo procedure by interventional radiology. -Likely precipitated by underlying malignancy. -Lovenox teaching provided to his wife.   Procedures:  Aspiration of probable pseudocyst on 04/19/2015  Consultations:  Gen Surgery  Discharge Exam: Filed Vitals:   04/23/15 0548  BP: 103/65  Pulse: 85  Temp: 98.1 F (36.7 C)  Resp: 18     General: Patient is awake and alert, no acute distress, he states feeling better today. Wants to go home  Cardiovascular: Regular rate and rhythm normal S1-S2 no murmurs rubs or gallops  Respiratory: Normal respiratory effort, lungs are clear to auscultation bilaterally  Abdomen: Patient having pain with deep palpation over the left upper quadrant region  Musculoskeletal: No extremity edema, sarcopenia present  Discharge Instructions   Discharge Instructions    Call MD for:  difficulty breathing, headache or visual disturbances    Complete by:  As directed      Call MD for:  extreme fatigue    Complete by:  As directed      Call MD  for:  hives    Complete by:  As directed      Call MD for:  persistant dizziness or light-headedness    Complete by:  As directed      Call MD for:  persistant nausea and vomiting    Complete by:  As directed      Call MD for:  redness, tenderness, or signs of infection (pain, swelling, redness, odor or green/yellow discharge around incision site)    Complete by:  As directed      Call MD for:  severe uncontrolled pain    Complete by:  As directed      Call MD for:  temperature >100.4    Complete by:  As directed      Call MD for:    Complete by:  As directed      Diet - low sodium heart healthy    Complete by:  As directed      Increase activity slowly    Complete by:  As directed           Current Discharge Medication List    START taking these medications   Details  amoxicillin-clavulanate (AUGMENTIN) 875-125 MG tablet Take 1 tablet by mouth 2 (two) times daily. Qty: 10 tablet, Refills: 0    enoxaparin (LOVENOX) 100 MG/ML injection Inject 1 mL (100 mg total) into the skin daily. Qty: 30 Syringe, Refills: 2      CONTINUE these medications which have NOT CHANGED   Details  famotidine (PEPCID) 20 MG tablet Take 20  mg by mouth daily.     ibuprofen (ADVIL,MOTRIN) 200 MG tablet Take 400 mg by mouth every 4 (four) hours as needed for fever, headache, mild pain, moderate pain or cramping.    loperamide (IMODIUM A-D) 2 MG tablet Take 2 mg by mouth 4 (four) times daily as needed for diarrhea or loose stools.    loratadine (CLARITIN) 10 MG tablet Take 10 mg by mouth daily.    LORazepam (ATIVAN) 0.5 MG tablet Take 1 tablet (0.5 mg total) by mouth 2 (two) times daily as needed for anxiety. Qty: 100 tablet, Refills: 5    oxymetazoline (AFRIN) 0.05 % nasal spray Place 1 spray into both nostrils 2 (two) times daily as needed for congestion.    potassium chloride SA (K-DUR,KLOR-CON) 20 MEQ tablet Take 1 tablet (20 mEq total) by mouth 2 (two) times daily. Qty: 30 tablet, Refills: 1     traZODone (DESYREL) 100 MG tablet Take 1 tablet (100 mg total) by mouth at bedtime. Qty: 30 tablet, Refills: 1    feeding supplement, ENSURE ENLIVE, (ENSURE ENLIVE) LIQD Take 237 mLs by mouth 2 (two) times daily between meals. Qty: 237 mL, Refills: 12    fluticasone (FLONASE) 50 MCG/ACT nasal spray Place 1 spray into both nostrils daily as needed for allergies.     HYDROmorphone (DILAUDID) 2 MG tablet Take 1 tablet (2 mg total) by mouth every 4 (four) hours as needed for severe pain. Qty: 30 tablet    prochlorperazine (COMPAZINE) 5 MG tablet Take 1-2 tablets (5-10 mg total) by mouth every 6 (six) hours as needed for nausea or vomiting. Qty: 30 tablet, Refills: 1   Associated Diagnoses: Lymphoma, large cell ([place])      STOP taking these medications     PRESCRIPTION MEDICATION      sorbitol 70 % solution        Allergies  Allergen Reactions  . Antihistamines, Diphenhydramine-Type     CAUSES PROSTATE TO SWELL & as a child had hyperactivity when took Benadryl Pt took Pepcid w/ Benadryl PO and did fine per Pt & his wife   Follow-up Information    Follow up with TODD,JEFFREY ALLEN, MD In 2 weeks.   Specialty:  Family Medicine   Contact information:   [place] [address] [phone]       Follow up with Betsy Coder, MD In 3 days.   Specialty:  Oncology   Contact information:   [address] [place] [address] [phone]        The results of significant diagnostics from this hospitalization (including imaging, microbiology, ancillary and laboratory) are listed below for reference.    Significant Diagnostic Studies: Dg Chest 2 View  04/21/2015  CLINICAL DATA:  Fever, Chills, generalized weakness, fatigue, onset today. H/o lymphoma and skin cancer. Nonsmoker. EXAM: CHEST  2 VIEW COMPARISON:  04/18/2015 FINDINGS: The heart size and vascular pattern are normal. There is mild hazy opacity in the left lung base. This is a subtle  difference when compared the prior study. There is a tiny left pleural effusion. Compression deformity mid thoracic spine stable. IMPRESSION: Tiny left pleural effusion with mild left lower lobe atelectasis Electronically Signed   By: Skipper Cliche M.D.   On: 04/21/2015 16:01   Dg Chest 2 View  04/18/2015  CLINICAL DATA:  74 year old male with fever and diarrhea for 48 hours. Cancer patient with metastatic disease unknown primary currently undergoing chemotherapy. Subsequent encounter. EXAM: CHEST  2 VIEW COMPARISON:  03/25/2015 and earlier FINDINGS: Semi upright AP and lateral views of the chest. Stable lung volumes. Stable cardiac size and mediastinal contours. No pneumothorax or pulmonary edema. No pleural effusion or acute pulmonary opacity identified. Severe mid thoracic pathologic compression fracture has progressed since August. Elsewhere stable visualized osseous structures. IMPRESSION: No acute cardiopulmonary abnormality. Electronically Signed   By: Genevie Ann M.D.   On: 04/18/2015 01:06   Ct Head Wo Contrast  03/24/2015  CLINICAL DATA:  Confusion since yesterday. Non-Hodgkin's lymphoma. Initial encounter. EXAM: CT HEAD WITHOUT CONTRAST TECHNIQUE: Contiguous axial images were obtained from the base of the skull through the vertex without intravenous contrast. COMPARISON:  None. FINDINGS: There is no evidence of acute intracranial hemorrhage, mass lesion, brain edema or extra-axial fluid collection. The ventricles and subarachnoid spaces are mildly prominent, consistent with mild atrophy. There is no CT evidence of acute cortical infarction. There is mild periventricular white matter disease, likely related to chronic small vessel ischemic changes. Intracranial vascular calcifications are noted. The visualized paranasal sinuses, mastoid air cells and middle ears are clear. The calvarium is intact. IMPRESSION: No acute intracranial findings. Mild atrophy and periventricular white matter disease.  Electronically Signed   By: Richardean Sale M.D.   On: 03/24/2015 20:33   Mr Jeri Cos PF Contrast  03/24/2015  CLINICAL DATA:  Confusion beginning yesterday, altered mental status. 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 EXAM: MRI HEAD WITHOUT AND WITH CONTRAST TECHNIQUE: Multiplanar, multiecho pulse sequences of the brain and surrounding structures were obtained without and with intravenous contrast. CONTRAST:  61mL MULTIHANCE GADOBENATE DIMEGLUMINE 529 MG/ML IV SOLN COMPARISON:  CT head March 24, 2015 at 2026 hours FINDINGS: Mildly motion degraded examination. The ventricles and sulci are normal for patient's age. No suspicious parenchymal signal, mass lesions, mass effect. Scattered subcentimeter supratentorial white matter T2 hyperintensities. 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 No abnormal parenchymal enhancement. RIGHT frontal developmental venous anomaly. No reduced diffusion to suggest acute ischemia. No susceptibility artifact to suggest hemorrhage. No abnormal extra-axial fluid collections. No extra-axial masses nor leptomeningeal enhancement. Normal major intracranial vascular flow voids seen at the skull base. Status post bilateral ocular lens implants. No suspicious calvarial bone marrow signal. No abnormal sellar expansion. Craniocervical junction maintained. Visualized paranasal sinuses and mastoid air cells are well-aerated. IMPRESSION: No acute intracranial process. Involutional changes. Mild chronic small vessel ischemic disease. 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 RIGHT frontal developmental venous anomaly, benign finding. Electronically Signed   By: Elon Alas M.D.   On: 03/24/2015 23:19   Ct Abdomen Pelvis W Contrast  04/18/2015  CLINICAL DATA:  74 year old male with abdominal pain weakness and fever. Undergoing chemotherapy. 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 Non-Hodgkin's lymphoma. Subsequent encounter. EXAM: CT ABDOMEN AND PELVIS WITH  CONTRAST TECHNIQUE: Multidetector CT imaging of the abdomen and pelvis was performed using the standard protocol following bolus administration of intravenous contrast. CONTRAST:  138mL OMNIPAQUE IOHEXOL 300 MG/ML  SOLN COMPARISON:  PET-CT 02/22/2015.  CT Abdomen and Pelvis 02/06/2015. FINDINGS: Lower lung volumes. Increased dependent opacity in both lungs most resembles atelectasis. No pericardial or pleural effusion. Destructive right sacral metastasis. Stable visualized osseous structures. Fluid in the distal colon. No pelvic free fluid. Unremarkable urinary bladder. Fluid in the sigmoid colon and descending colon. Abnormal left upper quadrant and splenic flexure with thickened and inflamed colon surrounded by extensive mesenteric stranding and loculated fluid collections. The largest collection is 6.7 cm inseparable from the tail of the pancreas. Other loculations of fluid also track  into the lesser sac. There is associated inflammatory stranding. However, the pancreatic parenchyma still appears fairly distinct. There is secondary involvement of some small bowel in the left upper quadrant. Proximal to these changes there is gaseous distension of the transverse colon and proximal splenic flexure. There is fluid in the right colon. Fluid-filled nondilated distal small bowel. Much of the proximal small bowel is decompressed. The inflammatory stranding also involves the residual gastrosplenic ligament and proximal stomach with associated wall thickening. The distal stomach and duodenum are spared. However, the proximal jejunum is secondarily affected. No abdominal free fluid or free air. Liver, gallbladder, adrenal glands, and kidneys are stable. Portal venous system is patent. Major arterial structures are patent. Stable mild infrarenal abdominal aortic aneurysm measuring 3.1 cm diameter. Soft and calcified aortic plaque. No lymphadenopathy in the abdomen or pelvis. IMPRESSION: 1. Status post splenectomy since the  prior CTs. Extensive inflammation in the left upper quadrant affecting in the splenic flexure of colon, the proximal small bowel, and the proximal stomach. Associated loculated fluid collections, the largest of which (6.7 cm) is inseparable from the pancreatic tail and most resembles a pseudocyst. Changes track into the lesser sac and I favor these are the sequelae of pancreatitis although the pancreatic enhancement appears relatively normal. Main differential consideration is post splenectomy infection. 2. No other acute or inflammatory process identified in the abdomen or pelvis. 3. Destructive right sacral metastasis appears stable. 4. Mild pulmonary atelectasis. Electronically Signed   By: Genevie Ann M.D.   On: 04/18/2015 03:02   Ct Aspiration  04/22/2015  CLINICAL DATA:  Left upper quadrant abdominal fluid collection following recent splenectomy. Current treatment for lymphoma. EXAM: CT GUIDED DRAINAGE OF LEFT PERITONEAL FLUID COLLECTION ANESTHESIA/SEDATION: 1.0 mg IV Versed, 25 mcg IV Fentanyl Total Moderate Sedation Time:  13 minutes PROCEDURE: The procedure, risks, benefits, and alternatives were explained to the patient. Questions regarding the procedure were encouraged and answered. The patient understands and consents to the procedure. A time-out was performed prior to the procedure. The left posterior upper abdominal region was prepped with Betadine in a sterile fashion, and a sterile drape was applied covering the operative field. A sterile gown and sterile gloves were used for the procedure. Local anesthesia was provided with 1% Lidocaine. CT was performed in a prone position. A 5 [place] Yueh catheter was advanced over a needle into the posterior left upper quadrant fluid collection. The collection was drained via the Yueh catheter by aspiration. The entire aspirated fluid sample was sent for culture analysis as well as amylase and lipase. The catheter was then removed and additional CT performed.  COMPLICATIONS: None FINDINGS: Aspiration at the level of the posterior left upper quadrant fluid collection yielded turbid, thin grayish colored fluid which was not grossly purulent. Drainage via the 5 French catheter yielded a total of 46 mL of fluid. This resulted in complete decompression by CT. A drain was not left in place given nature of fluid, aspiration results and clinical suspicion that this represents a pseudocyst. IMPRESSION: CT-guided drainage of left upper quadrant fluid collection yielding 46 mL of turbid and thin fluid. The fluid sample was sent for culture analysis as well as fluid amylase and lipase. CT shows complete decompression of the fluid collection after drainage. A drainage catheter was not left in place given nature of fluid and clinical suspicion that this represents a pseudocyst. Electronically Signed   By: Aletta Edouard M.D.   On: 04/22/2015 08:13   Dg Chest Port 1  View  03/25/2015  CLINICAL DATA:  Lymphoma. EXAM: PORTABLE CHEST - 1 VIEW COMPARISON:  03/07/2015. FINDINGS: Mediastinum hilar structures are normal. Low lung volumes. Heart size stable. No pleural effusion or pneumothorax . No acute bony abnormality . IMPRESSION: Low lung volumes. No acute cardiopulmonary disease noted. Chest is stable from prior exam. Electronically Signed   By: Marcello Moores  Register   On: 03/25/2015 07:53   Dg Abd Portable 1v  03/26/2015  CLINICAL DATA:  Ileus EXAM: PORTABLE ABDOMEN - 1 VIEW COMPARISON:  None. FINDINGS: Scattered large and small bowel gas is noted. No obstructive changes are seen. No abnormal mass or abnormal calcifications are noted. No free air is noted. IMPRESSION: No acute abnormality noted. Electronically Signed   By: Inez Catalina M.D.   On: 03/26/2015 12:09    Microbiology: Recent Results (from the past 240 hour(s))  Blood Culture (routine x 2)     Status: None (Preliminary result)   Collection Time: 04/18/15 12:21 AM  Result Value Ref Range Status   Specimen Description  BLOOD RIGHT ANTECUBITAL  Final   Special Requests BOTTLES DRAWN AEROBIC AND ANAEROBIC 5ML  Final   Culture   Final    NO GROWTH 4 DAYS Performed at [facility]    Report Status PENDING  Incomplete  Blood Culture (routine x 2)     Status: None (Preliminary result)   Collection Time: 04/18/15 12:24 AM  Result Value Ref Range Status   Specimen Description BLOOD LEFT ANTECUBITAL  Final   Special Requests BOTTLES DRAWN AEROBIC AND ANAEROBIC 5ML  Final   Culture   Final    NO GROWTH 4 DAYS Performed at [facility]    Report Status PENDING  Incomplete  Urine culture     Status: None   Collection Time: 04/18/15  3:41 AM  Result Value Ref Range Status   Specimen Description URINE, CLEAN CATCH  Final   Special Requests NONE  Final   Culture   Final    NO GROWTH 1 DAY Performed at [facility]    Report Status 04/19/2015 FINAL  Final  C difficile quick scan w PCR reflex     Status: None   Collection Time: 04/19/15 11:22 AM  Result Value Ref Range Status   C Diff antigen NEGATIVE NEGATIVE Final   C Diff toxin NEGATIVE NEGATIVE Final   C Diff interpretation Negative for toxigenic C. difficile  Final  Culture, routine-abscess     Status: None   Collection Time: 04/19/15  5:21 PM  Result Value Ref Range Status   Specimen Description ABSCESS  Final   Special Requests Normal  Final   Gram Stain   Final    NO WBC SEEN NO ORGANISMS SEEN Performed at [facility]    Culture   Final    NO GROWTH 3 DAYS Performed at [facility]    Report Status 04/23/2015 FINAL  Final  Anaerobic culture     Status: None (Preliminary result)   Collection Time: 04/19/15  5:21 PM  Result Value Ref Range Status   Specimen Description ABSCESS  Final   Special Requests Normal  Final   Gram Stain PENDING  Incomplete   Culture   Final    NO ANAEROBES ISOLATED; CULTURE IN PROGRESS FOR 5 DAYS Performed at [facility]    Report Status PENDING  Incomplete   Culture, blood (routine x 2)     Status: None (Preliminary result)   Collection Time: 04/21/15  3:53 PM  Result Value Ref Range Status   Specimen Description BLOOD RIGHT ANTECUBITAL  Final   Special Requests BOTTLES DRAWN AEROBIC AND ANAEROBIC 10CC  Final   Culture   Final    NO GROWTH < 24 HOURS Performed at [facility]    Report Status PENDING  Incomplete  Culture, blood (routine x 2)     Status: None (Preliminary result)   Collection Time: 04/21/15  3:59 PM  Result Value Ref Range Status   Specimen Description BLOOD LEFT FOREARM  Final   Special Requests BOTTLES DRAWN AEROBIC AND ANAEROBIC 10CC  Final   Culture   Final    NO GROWTH < 24 HOURS Performed at [facility]    Report Status PENDING  Incomplete     Labs: Basic Metabolic Panel:  Recent Labs Lab 04/18/15 0735 04/19/15 0425 04/20/15 0515 04/21/15 0535 04/22/15 0505  NA 134* 133* 134* 140 138  K 3.4* 3.1* 3.2* 3.8 3.5  CL 103 103 102 106 104  CO2 23 24 24 26 26   GLUCOSE 98 93 96 98 99  BUN 6 6 5* <5* <5*  CREATININE 0.59* 0.63 0.63 0.54* 0.60*  CALCIUM 7.8* 7.8* 7.7* 8.3* 8.1*   Liver Function Tests:  Recent Labs Lab 04/18/15 0025 04/18/15 0735 04/19/15 0425 04/22/15 0505  AST 24 24 22 25   ALT 37 34 27 22  ALKPHOS 76 74 70 61  BILITOT 0.7 0.6 0.7 0.5  PROT 5.5* 5.2* 4.8* 5.0*  ALBUMIN 2.5* 2.3* 2.1* 2.2*    Recent Labs Lab 04/18/15 0735  LIPASE 60*  AMYLASE 167*   No results for input(s): AMMONIA in the last 168 hours. CBC:  Recent Labs Lab 04/18/15 0025 04/18/15 0735 04/19/15 0425 04/20/15 0515 04/21/15 0535 04/22/15 0505 04/23/15 0517  WBC 11.9* 13.3* 16.5* 17.7* 16.5* 15.2* 11.0*  NEUTROABS 10.0* 11.6*  --   --   --   --   --   HGB 10.0* 10.0* 9.2* 8.9* 9.5* 9.7* 9.6*  HCT 29.6* 29.5* 27.7* 27.0* 29.1* 29.4* 29.5*  MCV 89.2 88.9 88.5 88.2 89.0 90.2 89.1  PLT 239 270 282 285 354 379 403*   Cardiac Enzymes: No results for input(s): CKTOTAL, CKMB, CKMBINDEX,  TROPONINI in the last 168 hours. BNP: BNP (last 3 results) No results for input(s): BNP in the last 8760 hours.  ProBNP (last 3 results) No results for input(s): PROBNP in the last 8760 hours.  CBG: No results for input(s): GLUCAP in the last 168 hours.     SignedKelvin Cellar  [facility] 04/23/2015, 8:32 AM

## 2015-04-23 NOTE — Progress Notes (Signed)
Patient ID: Perry Nielsen., male   DOB: 20-Jul-1940, 74 y.o.   MRN: [id number]     [facility] [place]      [address] [place], [place] [address]    Phone: [phone] FAX: [phone]     Subjective: Appetite is fair, discussed increasing protein, shakes and small meals more frequently.   No abdominal pain.   Objective:  Vital signs:  Filed Vitals:   04/22/15 0452 04/22/15 1459 04/22/15 2215 04/23/15 0548  BP: 94/64 110/68 111/76 103/65  Pulse: 94 88 88 85  Temp: 98.8 F (37.1 C) 98.4 F (36.9 C) 98.2 F (36.8 C) 98.1 F (36.7 C)  TempSrc: Oral Oral Oral Oral  Resp: '20 18 18 18  ' Height:      Weight: 66.5 kg (146 lb 9.7 oz)   66.3 kg (146 lb 2.6 oz)  SpO2: 95% 97% 97% 97%    Last BM Date: 04/23/15  Intake/Output   Yesterday:  10/17 0701 - 10/18 0700 In: -  Out: 600 [Urine:600] This shift:    Physical Exam: General: Pt awake/alert/oriented x4 in no  acute distress Abdomen: Soft.  Nondistended.  Non tender.  No evidence of peritonitis.  No incarcerated hernias.    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[place])   Sepsis ([place])   DVT (deep venous thrombosis) ([place])   Abdominal fluid collection    Results:   Labs: Results for orders placed or performed during the hospital encounter of 04/17/15 (from the past 48 hour(s))  Urinalysis, Routine w reflex microscopic (not at [facility])     Status: None   Collection Time: 04/21/15  2:39 PM  Result Value Ref Range   Color, Urine YELLOW YELLOW   APPearance CLEAR CLEAR   Specific Gravity, Urine 1.015 1.005 - 1.030   pH 7.0 5.0 - 8.0   Glucose, UA NEGATIVE NEGATIVE mg/dL   Hgb urine dipstick NEGATIVE NEGATIVE   Bilirubin Urine NEGATIVE NEGATIVE   Ketones, ur NEGATIVE NEGATIVE mg/dL   Protein, ur NEGATIVE NEGATIVE mg/dL   Urobilinogen, UA 0.2 0.0 - 1.0 mg/dL    Nitrite NEGATIVE NEGATIVE   Leukocytes, UA NEGATIVE NEGATIVE    Comment: MICROSCOPIC NOT DONE ON URINES WITH NEGATIVE PROTEIN, BLOOD, LEUKOCYTES, NITRITE, OR GLUCOSE <1000 mg/dL.  Culture, blood (routine x 2)     Status: None (Preliminary result)   Collection Time: 04/21/15  3:53 PM  Result Value Ref Range   Specimen Description BLOOD RIGHT ANTECUBITAL    Special Requests BOTTLES DRAWN AEROBIC AND ANAEROBIC 10CC    Culture      NO GROWTH < 24 HOURS Performed at [facility]    Report Status PENDING   Vancomycin, trough     Status: None   Collection Time: 04/21/15  3:53 PM  Result Value Ref Range   Vancomycin Tr 20 10.0 - 20.0 ug/mL  Culture, blood (routine x 2)     Status: None (Preliminary result)   Collection Time: 04/21/15  3:59 PM  Result Value Ref Range   Specimen Description BLOOD LEFT FOREARM    Special Requests BOTTLES DRAWN AEROBIC AND ANAEROBIC 10CC    Culture      NO GROWTH < 24 HOURS Performed at [facility]    Report Status PENDING   CBC     Status: Abnormal  Collection Time: 04/22/15  5:05 AM  Result Value Ref Range   WBC 15.2 (H) 4.0 - 10.5 K/uL   RBC 3.26 (L) 4.22 - 5.81 MIL/uL   Hemoglobin 9.7 (L) 13.0 - 17.0 g/dL   HCT 29.4 (L) 39.0 - 52.0 %   MCV 90.2 78.0 - 100.0 fL   MCH 29.8 26.0 - 34.0 pg   MCHC 33.0 30.0 - 36.0 g/dL   RDW 16.7 (H) 11.5 - 15.5 %   Platelets 379 150 - 400 K/uL  Comprehensive metabolic panel     Status: Abnormal   Collection Time: 04/22/15  5:05 AM  Result Value Ref Range   Sodium 138 135 - 145 mmol/L   Potassium 3.5 3.5 - 5.1 mmol/L   Chloride 104 101 - 111 mmol/L   CO2 26 22 - 32 mmol/L   Glucose, Bld 99 65 - 99 mg/dL   BUN <5 (L) 6 - 20 mg/dL   Creatinine, Ser 0.60 (L) 0.61 - 1.24 mg/dL   Calcium 8.1 (L) 8.9 - 10.3 mg/dL   Total Protein 5.0 (L) 6.5 - 8.1 g/dL   Albumin 2.2 (L) 3.5 - 5.0 g/dL   AST 25 15 - 41 U/L   ALT 22 17 - 63 U/L   Alkaline Phosphatase 61 38 - 126 U/L   Total Bilirubin 0.5 0.3 - 1.2  mg/dL   GFR calc non Af Amer >60 >60 mL/min   GFR calc Af Amer >60 >60 mL/min    Comment: (NOTE) The eGFR has been calculated using the CKD EPI equation. This calculation has not been validated in all clinical situations. eGFR's persistently <60 mL/min signify possible Chronic Kidney Disease.    Anion gap 8 5 - 15  CBC     Status: Abnormal   Collection Time: 04/23/15  5:17 AM  Result Value Ref Range   WBC 11.0 (H) 4.0 - 10.5 K/uL   RBC 3.31 (L) 4.22 - 5.81 MIL/uL   Hemoglobin 9.6 (L) 13.0 - 17.0 g/dL   HCT 29.5 (L) 39.0 - 52.0 %   MCV 89.1 78.0 - 100.0 fL   MCH 29.0 26.0 - 34.0 pg   MCHC 32.5 30.0 - 36.0 g/dL   RDW 16.7 (H) 11.5 - 15.5 %   Platelets 403 (H) 150 - 400 K/uL    Imaging / Studies: Dg Chest 2 View  04/21/2015  CLINICAL DATA:  Fever, Chills, generalized weakness, fatigue, onset today. H/o lymphoma and skin cancer. Nonsmoker. EXAM: CHEST  2 VIEW COMPARISON:  04/18/2015 FINDINGS: The heart size and vascular pattern are normal. There is mild hazy opacity in the left lung base. This is a subtle difference when compared the prior study. There is a tiny left pleural effusion. Compression deformity mid thoracic spine stable. IMPRESSION: Tiny left pleural effusion with mild left lower lobe atelectasis Electronically Signed   By: Skipper Cliche M.D.   On: 04/21/2015 16:01    Medications / Allergies:  Scheduled Meds: . cefTAZidime (FORTAZ)  IV  2 g Intravenous Q8H  . enoxaparin (LOVENOX) injection  1 mg/kg Subcutaneous Q12H  . famotidine  20 mg Oral Daily  . hydrocerin   Topical BID  . metronidazole  500 mg Intravenous Q8H  . sodium chloride  3 mL Intravenous Q12H  . traZODone  100 mg Oral QHS  . vancomycin  750 mg Intravenous Q8H   Continuous Infusions: . sodium chloride 100 mL/hr at 04/22/15 2230   PRN Meds:.acetaminophen **OR** acetaminophen, fluticasone, LORazepam, ondansetron **OR** ondansetron (ZOFRAN)  IV, oxymetazoline  Antibiotics: Anti-infectives    Start      Dose/Rate Route Frequency Ordered Stop   04/23/15 0000  amoxicillin-clavulanate (AUGMENTIN) 875-125 MG tablet     1 tablet Oral 2 times daily 04/23/15 0830     04/22/15 0000  cefTAZidime (FORTAZ) 2 g in dextrose 5 % 50 mL IVPB     2 g 100 mL/hr over 30 Minutes Intravenous Every 8 hours 04/21/15 1740     04/21/15 2000  metroNIDAZOLE (FLAGYL) IVPB 500 mg     500 mg 100 mL/hr over 60 Minutes Intravenous Every 8 hours 04/21/15 1741     04/21/15 1800  vancomycin (VANCOCIN) IVPB 750 mg/150 ml premix     750 mg 150 mL/hr over 60 Minutes Intravenous Every 8 hours 04/21/15 1732     04/21/15 1600  cefTAZidime (FORTAZ) 1 g in dextrose 5 % 50 mL IVPB  Status:  Discontinued     1 g 100 mL/hr over 30 Minutes Intravenous Every 8 hours 04/21/15 1456 04/21/15 1740   04/21/15 1000  amoxicillin-clavulanate (AUGMENTIN) 875-125 MG per tablet 1 tablet  Status:  Discontinued     1 tablet Oral Every 12 hours 04/21/15 0931 04/21/15 1456   04/18/15 1600  vancomycin (VANCOCIN) IVPB 750 mg/150 ml premix  Status:  Discontinued     750 mg 150 mL/hr over 60 Minutes Intravenous Every 8 hours 04/18/15 1459 04/21/15 0930   04/18/15 1400  cefTAZidime (FORTAZ) 2 g in dextrose 5 % 50 mL IVPB  Status:  Discontinued     2 g 100 mL/hr over 30 Minutes Intravenous 3 times per day 04/18/15 0515 04/21/15 0930   04/18/15 0230  cefTAZidime (FORTAZ) 2 g in dextrose 5 % 50 mL IVPB     2 g 100 mL/hr over 30 Minutes Intravenous  Once 04/18/15 0220 04/18/15 0500   04/18/15 0130  vancomycin (VANCOCIN) IVPB 1000 mg/200 mL premix     1,000 mg 200 mL/hr over 60 Minutes Intravenous  Once 04/18/15 0118 04/18/15 0500   04/18/15 0130  metroNIDAZOLE (FLAGYL) IVPB 500 mg     500 mg 100 mL/hr over 60 Minutes Intravenous  Once 04/18/15 0118 04/18/15 0340        Assessment/Plan: 1. Pancreatitic psuedocyst On Flagyl/Vanc stable for DC.  OP f/u with Dr. Excell Seltzer   2. 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 Lymphoma, large B-cell Followed by Dr. Benay Spice - original diagnosis in Aug 2016. Has numerous hypermetabolic bone lesions throughout the axial skeleton   4. Right foot drop 5. Has bilateral DVT - on Lovenox   Erby Pian, ANP-BC [facility] Pager [phone](7A-4:30P)   04/23/2015 9:53 AM  Agree with above.  Alphonsa Overall, MD, [facility] Pager: [phone] Office phone:  [phone]

## 2015-04-23 NOTE — Care Management Note (Signed)
Case Management Note  Patient Details  Name: Perry Nielsen. MRN: [id number] Date of Birth: [birth year]  Subjective/Objective:  Per patient/spouse they only want HHPT. MD notified. [facility] chosen.[facility] rep kKisten aware of HHPT only, even though [place] ordered for HHRN/PT/OT/aide.                  Action/Plan:d/c home w/[facility]HC-HHPT.   Expected Discharge Date:                  Expected Discharge Plan:  [place]  In-House Referral:     Discharge planning Services  CM Consult  Post Acute Care Choice:    Choice offered to:  Patient  DME Arranged:    DME Agency:     HH Arranged:  PT [place]:  [facility]  Status of Service:  Completed, signed off  Medicare Important Message Given:  Yes-third notification given Date Medicare IM Given:    Medicare IM give by:    Date Additional Medicare IM Given:    Additional Medicare Important Message give by:     If discussed at [place] of Stay Meetings, dates discussed:    Additional Comments:  [name], RN 04/23/2015, 10:46 AM

## 2015-04-23 NOTE — Progress Notes (Signed)
PHARMACY CONSULT NOTE - Follow Up  Pharmacy Consult for Vancomycin Indication: Fever, r/o intra-abdominal infection  Allergies  Allergen Reactions  . Antihistamines, Diphenhydramine-Type     CAUSES PROSTATE TO SWELL & as a child had hyperactivity when took Benadryl Pt took Pepcid w/ Benadryl PO and did fine per Pt & his wife    Patient Measurements: Height: 6' (182.9 cm) Weight: 146 lb 2.6 oz (66.3 kg) IBW/kg (Calculated) : 77.6  Vital Signs: Temp: 98.1 F (36.7 C) (10/18 0548) Temp Source: Oral (10/18 0548) BP: 103/65 mmHg (10/18 0548) Pulse Rate: 85 (10/18 0548) Intake/Output from previous day: 10/17 0701 - 10/18 0700 In: -  Out: 600 [Urine:600]  Labs:  Recent Labs  04/21/15 0535 04/22/15 0505 04/23/15 0517  WBC 16.5* 15.2* 11.0*  HGB 9.5* 9.7* 9.6*  PLT 354 379 403*  CREATININE 0.54* 0.60*  --    Estimated Creatinine Clearance: 77.1 mL/min (by C-G formula based on Cr of 0.6).  Recent Labs  04/21/15 1553 04/23/15 0925  VANCOTROUGH 20 20     Assessment: 45 yoM with h/o lymphoma undergoing chemotherapy and s/p splenectomy for splenic mass. CAT scan shows extensive inflammation with loculated fluid collection suggesting possible infection or abscess. Ceftazidime and Vancomycin started empirically.  CT-guided aspiration by IR on 10/14 showed amylase c/w pseudocyst and antibiotics were narrowed to Augmentin on 10/16.  Ceftazidime and Flagyl per MD and Vancomycin per pharmacy are now resumed for fever, chills, and increasing abdominal tenderness.  10/13 >> Flagyl x 1 10/13 >> Vanc, resume 10/16 >>  10/13 >> Ceftaz, resume 10/16 >>  10/16>> Augmentin >> 10/16 10/16 >> Flagyl >>  10/13 urine: ngf 10/13 blood x2: ngtd 10/14 Cdiff: Ag neg, Toxin neg 10/14 LUQ abscess fluid: No anaerobes, NGTD 10/16 Blood x2:   Dose adjustments/levels: 10/14: VT = 16 on 750 mg q8h (6 hour level before 4th dose) 10/16 VT = 20, about 7 hours after last dose on 750 q8h . 10/18  VT at 0930 = 20 on 750mg  IV q8h  Goal of Therapy:  Vancomycin trough level 15-20 mcg/ml  Appropriate abx dosing, eradication of infection.   Plan:   Continue Vancomycin 750mg  IV q8h as patient now being discharged today  Follow up renal fxn, culture results, and clinical course.   Doreene Eland, PharmD, BCPS.   Pager: [phone] 04/23/2015 10:33 AM

## 2015-04-24 DIAGNOSIS — G9340 Encephalopathy, unspecified: Secondary | ICD-10-CM | POA: Diagnosis not present

## 2015-04-24 DIAGNOSIS — D72829 Elevated white blood cell count, unspecified: Secondary | ICD-10-CM | POA: Diagnosis not present

## 2015-04-24 DIAGNOSIS — M21371 Foot drop, right foot: Secondary | ICD-10-CM | POA: Diagnosis not present

## 2015-04-24 DIAGNOSIS — L89302 Pressure ulcer of unspecified buttock, stage 2: Secondary | ICD-10-CM | POA: Diagnosis not present

## 2015-04-24 DIAGNOSIS — E440 Moderate protein-calorie malnutrition: Secondary | ICD-10-CM | POA: Diagnosis not present

## 2015-04-24 DIAGNOSIS — C8380 Other non-follicular lymphoma, unspecified site: Secondary | ICD-10-CM | POA: Diagnosis not present

## 2015-04-24 LAB — LIPASE, FLUID: LIPASE-FLUID: 30906 U/L

## 2015-04-24 LAB — ANAEROBIC CULTURE
GRAM STAIN: NONE SEEN
SPECIAL REQUESTS: NORMAL

## 2015-04-26 ENCOUNTER — Ambulatory Visit (HOSPITAL_BASED_OUTPATIENT_CLINIC_OR_DEPARTMENT_OTHER): Payer: Medicare Other

## 2015-04-26 ENCOUNTER — Other Ambulatory Visit (HOSPITAL_BASED_OUTPATIENT_CLINIC_OR_DEPARTMENT_OTHER): Payer: Medicare Other

## 2015-04-26 ENCOUNTER — Telehealth: Payer: Self-pay | Admitting: Oncology

## 2015-04-26 ENCOUNTER — Other Ambulatory Visit: Payer: Self-pay | Admitting: *Deleted

## 2015-04-26 ENCOUNTER — Ambulatory Visit: Payer: Medicare Other | Admitting: Nutrition

## 2015-04-26 ENCOUNTER — Other Ambulatory Visit: Payer: Self-pay | Admitting: Nurse Practitioner

## 2015-04-26 ENCOUNTER — Ambulatory Visit (HOSPITAL_BASED_OUTPATIENT_CLINIC_OR_DEPARTMENT_OTHER): Payer: Medicare Other | Admitting: Oncology

## 2015-04-26 VITALS — BP 78/52 | HR 115 | Temp 98.0°F | Resp 18 | Ht 72.0 in | Wt 136.6 lb

## 2015-04-26 DIAGNOSIS — Z86718 Personal history of other venous thrombosis and embolism: Secondary | ICD-10-CM | POA: Diagnosis not present

## 2015-04-26 DIAGNOSIS — C8580 Other specified types of non-Hodgkin lymphoma, unspecified site: Secondary | ICD-10-CM

## 2015-04-26 DIAGNOSIS — E860 Dehydration: Secondary | ICD-10-CM

## 2015-04-26 DIAGNOSIS — C8339 Diffuse large B-cell lymphoma, extranodal and solid organ sites: Secondary | ICD-10-CM | POA: Diagnosis not present

## 2015-04-26 LAB — COMPREHENSIVE METABOLIC PANEL (CC13)
ALT: 43 U/L (ref 0–55)
ANION GAP: 10 meq/L (ref 3–11)
AST: 42 U/L — ABNORMAL HIGH (ref 5–34)
Albumin: 2.2 g/dL — ABNORMAL LOW (ref 3.5–5.0)
Alkaline Phosphatase: 81 U/L (ref 40–150)
BUN: 8.4 mg/dL (ref 7.0–26.0)
CHLORIDE: 107 meq/L (ref 98–109)
CO2: 22 meq/L (ref 22–29)
Calcium: 8.6 mg/dL (ref 8.4–10.4)
Creatinine: 0.7 mg/dL (ref 0.7–1.3)
Glucose: 115 mg/dl (ref 70–140)
POTASSIUM: 3.9 meq/L (ref 3.5–5.1)
SODIUM: 139 meq/L (ref 136–145)
Total Protein: 5.3 g/dL — ABNORMAL LOW (ref 6.4–8.3)

## 2015-04-26 LAB — CBC WITH DIFFERENTIAL/PLATELET
BASO%: 0.5 % (ref 0.0–2.0)
BASOS ABS: 0.1 10*3/uL (ref 0.0–0.1)
EOS ABS: 0 10*3/uL (ref 0.0–0.5)
EOS%: 0.2 % (ref 0.0–7.0)
HCT: 34.4 % — ABNORMAL LOW (ref 38.4–49.9)
HGB: 11.3 g/dL — ABNORMAL LOW (ref 13.0–17.1)
LYMPH%: 11 % — AB (ref 14.0–49.0)
MCH: 29.4 pg (ref 27.2–33.4)
MCHC: 32.8 g/dL (ref 32.0–36.0)
MCV: 89.4 fL (ref 79.3–98.0)
MONO#: 1.3 10*3/uL — ABNORMAL HIGH (ref 0.1–0.9)
MONO%: 12.1 % (ref 0.0–14.0)
NEUT#: 8 10*3/uL — ABNORMAL HIGH (ref 1.5–6.5)
NEUT%: 76.2 % — ABNORMAL HIGH (ref 39.0–75.0)
PLATELETS: 448 10*3/uL — AB (ref 140–400)
RBC: 3.85 10*6/uL — AB (ref 4.20–5.82)
RDW: 17.2 % — ABNORMAL HIGH (ref 11.0–14.6)
WBC: 10.5 10*3/uL — ABNORMAL HIGH (ref 4.0–10.3)
lymph#: 1.2 10*3/uL (ref 0.9–3.3)
nRBC: 0 % (ref 0–0)

## 2015-04-26 LAB — LIPASE: Lipase: 65 U/L — ABNORMAL HIGH (ref 7–60)

## 2015-04-26 LAB — AMYLASE: Amylase: 103 U/L (ref 0–105)

## 2015-04-26 LAB — LACTATE DEHYDROGENASE (CC13): LDH: 275 U/L — ABNORMAL HIGH (ref 125–245)

## 2015-04-26 MED ORDER — SODIUM CHLORIDE 0.9 % IV SOLN
Freq: Once | INTRAVENOUS | Status: DC
  Administered 2015-04-26: 10:00:00 via INTRAVENOUS

## 2015-04-26 MED ORDER — MEGESTROL ACETATE 40 MG/ML PO SUSP: 200.0000 mg | Freq: Every day | ORAL | Status: DC

## 2015-04-26 NOTE — Progress Notes (Addendum)
[facility] OFFICE PROGRESS NOTE   Diagnosis:  Non-Hodgkin's lymphoma  INTERVAL HISTORY:   Mr. Enyeart returns as scheduled. He completed cycle 2 CHOP/Rituxan 04/05/2015. He was hospitalized 04/18/2015 through 04/23/2015 with fever and abdominal pain. Amylase and lipase were elevated. Abdominal CT showed extensive inflammation in the left upper quadrant affecting the splenic flexure of the colon, proximal small bowel and proximal stomach. There were associated loculated fluid collections, the largest inseparable from the pancreatic tail. Aspiration of the fluid collection was performed on 04/19/2015. Cultures on the fluid remained negative. Fluid lipase level  returned at 30,906; amylase greater than 10,000. Blood cultures and a urine culture also negative. He received antibiotics during the hospitalization and was discharged home on Augmentin 04/23/2015.  He continues to have a poor appetite and poor energy level. No fever. He has mild pain at the left upper abdomen. He has intermittent loose stools. He denies any bleeding. He feels "lightheaded". He feels the left leg is stronger. He continues physical therapy. Left foot pain is better. He infrequently takes pain medication.  Objective:  Vital signs in last 24 hours:  Blood pressure 78/52, pulse 115, temperature 98 F (36.7 C), temperature source Oral, resp. rate 18, height 6' (1.829 m), weight 136 lb 9.6 oz (61.961 kg), SpO2 97 %.    HEENT: Mouth appears dry. Resp: Lungs clear bilaterally. Cardio: Regular rate and rhythm. GI: Abdomen soft, mildly tender at the left upper abdomen. Slight fullness left upper abdomen. No hepatomegaly. Vascular: Pitting edema right lower leg. Neuro: Alert, mildly confused. Follows commands. Weakness at the right lower leg/foot. Skin: Decreased skin turgor.    Lab Results:  Lab Results  Component Value Date   WBC 10.5* 04/26/2015   HGB 11.3* 04/26/2015   HCT 34.4* 04/26/2015   MCV  89.4 04/26/2015   PLT 448* 04/26/2015   NEUTROABS 8.0* 04/26/2015    Imaging:  No results found.  Medications: I have reviewed the patient's current medications.  Assessment/Plan: 1. 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 History of thrombocytopenia predating chemotherapy 3. T7 compression fracture-likely pathologic 4. 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 Right foot pain-likely secondary to nerve compression from the sacral mass, improved 6. Diffuse erythematous rash, appearance consistent with a "drug" rash-resolved 7. History of Low-grade fever likely secondary to atelectasis/postop fever versus tumor fever 8. Hepatitis B core antibody positive 9. Altered mental status-likely secondary delirium in the setting of infection and hospitalization 10. Admission 04/18/2015 through 04/23/2015 presenting with fever; amylase and lipase elevated. Abdominal CT showed inflammation in the left upper quadrant and associated loculated fluid collections status post drainage procedure; cultures negative; question pancreatitis. 11. Bilateral lower extremity DVTs on a Doppler 04/18/2015, now on Lovenox.   Disposition: Mr. Carithers has completed 2 cycles of CHOP/Rituxan. The left leg pain is better. At present he has a poor performance status, etiology unclear. We are holding  today's treatment. We will obtain a cortisol level.   He was recently hospitalized with fever, possible pancreatitis. We will  repeat amylase and lipase levels today.  He appears dehydrated. He will receive IV fluids today and tomorrow.  He will return for a follow-up visit on 04/30/2015. He will contact the office in the interim with any problems.  Patient seen with Dr. Benay Spice. 25 minutes were spent face-to-face at today's visit with the majority of that time involved in counseling/coordination of care.   Ned Card ANP/GNP-BC   04/26/2015  9:29 AM This was a shared visit with Ned Card. Mr. Seeman was interviewed and examined. He continues to have a poor performance status with anorexia. He appears dehydrated today. He will receive intravenous fluids and chemotherapy will be placed on hold.  His symptoms may be related to pancreatitis. We will repeat amylase and lipase levels and a cortisol level today. He will return for IV fluids tomorrow. He met with the [place] nutritionist today.  Mr. Lance will return for an office visit on 04/30/2015.  Julieanne Manson, M.D.

## 2015-04-26 NOTE — Patient Instructions (Signed)

## 2015-04-26 NOTE — Telephone Encounter (Signed)
Gave and printed appt sched and avs fo rpt for OCT °

## 2015-04-26 NOTE — Progress Notes (Signed)
Nutrition follow-up completed with patient during IV fluids for non-Hodgkin's lymphoma. Weight has decreased was documented as 136.6 pounds on October 21. Noted patient's recent hospitalization for pancreatic pseudocyst. Patient reports he continues to struggle to eat and drink. Still reports he is afraid to eat and fears that he will develop diarrhea. States he does tolerate juice-based oral nutrition supplements which were provided to him at last nutrition visit.  Nutrition diagnosis: Unintended weight loss continues.  Intervention: Educated patient on strategies for consuming oral nutrition supplements at least 3 times a day Encouraged small bites of bland, low fiber foods throughout the day and educated patient that these should not contribute to diarrhea. Provided additional information on additional fluids. Provided samples of ensure clear and boost breeze. Questions were answered.  Teach back method was used.  Monitoring, evaluation, goals: Patient will work to increase oral nutrition supplements 3 times a day along with other fluids to improve overall intake.  Next visit: To be scheduled as needed.  **Disclaimer: This note was dictated with voice recognition software. Similar sounding words can inadvertently be transcribed and this note may contain transcription errors which may not have been corrected upon publication of note.**

## 2015-04-27 ENCOUNTER — Ambulatory Visit: Payer: Self-pay

## 2015-04-27 ENCOUNTER — Ambulatory Visit (HOSPITAL_BASED_OUTPATIENT_CLINIC_OR_DEPARTMENT_OTHER): Payer: Medicare Other

## 2015-04-27 VITALS — BP 95/66 | HR 80 | Temp 98.2°F | Resp 17

## 2015-04-27 DIAGNOSIS — E860 Dehydration: Secondary | ICD-10-CM

## 2015-04-27 LAB — CULTURE, BLOOD (ROUTINE X 2)
CULTURE: NO GROWTH
Culture: NO GROWTH

## 2015-04-27 LAB — CORTISOL: CORTISOL PLASMA: 21.4 ug/dL

## 2015-04-27 LAB — HEPATITIS B SURFACE ANTIGEN: Hepatitis B Surface Ag: NEGATIVE

## 2015-04-27 MED ORDER — SODIUM CHLORIDE 0.9 % IV SOLN
INTRAVENOUS | Status: DC
  Administered 2015-04-27: 09:00:00 via INTRAVENOUS

## 2015-04-27 NOTE — Patient Instructions (Signed)

## 2015-04-30 ENCOUNTER — Ambulatory Visit (HOSPITAL_BASED_OUTPATIENT_CLINIC_OR_DEPARTMENT_OTHER): Payer: Medicare Other | Admitting: Nurse Practitioner

## 2015-04-30 ENCOUNTER — Encounter: Payer: Self-pay | Admitting: Nurse Practitioner

## 2015-04-30 ENCOUNTER — Telehealth: Payer: Self-pay | Admitting: Oncology

## 2015-04-30 ENCOUNTER — Other Ambulatory Visit (HOSPITAL_BASED_OUTPATIENT_CLINIC_OR_DEPARTMENT_OTHER): Payer: Medicare Other

## 2015-04-30 ENCOUNTER — Other Ambulatory Visit: Payer: Self-pay

## 2015-04-30 ENCOUNTER — Ambulatory Visit: Payer: Self-pay | Admitting: Nurse Practitioner

## 2015-04-30 VITALS — BP 88/55 | HR 112 | Temp 97.5°F | Resp 18 | Ht 72.0 in | Wt 137.3 lb

## 2015-04-30 DIAGNOSIS — R630 Anorexia: Secondary | ICD-10-CM

## 2015-04-30 DIAGNOSIS — R197 Diarrhea, unspecified: Secondary | ICD-10-CM

## 2015-04-30 DIAGNOSIS — C8580 Other specified types of non-Hodgkin lymphoma, unspecified site: Secondary | ICD-10-CM | POA: Diagnosis not present

## 2015-04-30 DIAGNOSIS — R627 Adult failure to thrive: Secondary | ICD-10-CM

## 2015-04-30 DIAGNOSIS — Z86718 Personal history of other venous thrombosis and embolism: Secondary | ICD-10-CM

## 2015-04-30 LAB — COMPREHENSIVE METABOLIC PANEL (CC13)
ALBUMIN: 2.3 g/dL — AB (ref 3.5–5.0)
ALT: 33 U/L (ref 0–55)
AST: 38 U/L — ABNORMAL HIGH (ref 5–34)
Alkaline Phosphatase: 95 U/L (ref 40–150)
Anion Gap: 7 mEq/L (ref 3–11)
BILIRUBIN TOTAL: 0.31 mg/dL (ref 0.20–1.20)
BUN: 10.6 mg/dL (ref 7.0–26.0)
CALCIUM: 8.8 mg/dL (ref 8.4–10.4)
CO2: 23 mEq/L (ref 22–29)
Chloride: 108 mEq/L (ref 98–109)
Creatinine: 0.7 mg/dL (ref 0.7–1.3)
Glucose: 108 mg/dl (ref 70–140)
POTASSIUM: 4.4 meq/L (ref 3.5–5.1)
Sodium: 138 mEq/L (ref 136–145)
Total Protein: 5.3 g/dL — ABNORMAL LOW (ref 6.4–8.3)

## 2015-04-30 LAB — CBC WITH DIFFERENTIAL/PLATELET
BASO%: 1.2 % (ref 0.0–2.0)
BASOS ABS: 0.1 10*3/uL (ref 0.0–0.1)
EOS ABS: 0.1 10*3/uL (ref 0.0–0.5)
EOS%: 0.9 % (ref 0.0–7.0)
HEMATOCRIT: 37.2 % — AB (ref 38.4–49.9)
HEMOGLOBIN: 11.7 g/dL — AB (ref 13.0–17.1)
LYMPH#: 1.4 10*3/uL (ref 0.9–3.3)
LYMPH%: 16.1 % (ref 14.0–49.0)
MCH: 28.8 pg (ref 27.2–33.4)
MCHC: 31.4 g/dL — ABNORMAL LOW (ref 32.0–36.0)
MCV: 92 fL (ref 79.3–98.0)
MONO#: 0.9 10*3/uL (ref 0.1–0.9)
MONO%: 10.1 % (ref 0.0–14.0)
NEUT#: 6.3 10*3/uL (ref 1.5–6.5)
NEUT%: 71.7 % (ref 39.0–75.0)
Platelets: 419 10*3/uL — ABNORMAL HIGH (ref 140–400)
RBC: 4.04 10*6/uL — ABNORMAL LOW (ref 4.20–5.82)
RDW: 19 % — AB (ref 11.0–14.6)
WBC: 8.8 10*3/uL (ref 4.0–10.3)

## 2015-04-30 NOTE — Telephone Encounter (Signed)
Gave patient wife avs report and appointments for October  °

## 2015-04-30 NOTE — Progress Notes (Addendum)
[facility] OFFICE PROGRESS NOTE   Diagnosis:   Non-Hodgkin's lymphoma  INTERVAL HISTORY:   Mr. Capuano returns as scheduled. He received IV fluids 04/26/2015 and 04/27/2015. He noted minimal improvement following the IV fluids. Oral intake continues to be poor though he thinks his appetite has been slightly better over the past few days.  No fevers or sweats. No shortness of breath. He has an occasional cough. No nausea or vomiting. He continues to have 3-4 loose stools a day. He takes Imodium as needed. He has intermittent right foot pain. He is taking Dilaudid once a day. Energy level continues to be poor.  Objective:  Vital signs in last 24 hours:  Blood pressure 88/55, pulse 112, temperature 97.5 F (36.4 C), temperature source Oral, resp. rate 18, height 6' (1.829 m), weight 137 lb 4.8 oz (62.279 kg), SpO2 95 %.    HEENT:  No thrush or ulcers. Resp:  Lungs clear bilaterally. Cardio:  Regular, tachycardic. GI:  Abdomen soft, nontender. Fullness over the left upper abdomen. Vascular:  Pitting edema right lower leg. Neuro:  Alert, oriented. Follows commands. Weakness at the right lower leg/foot.     Lab Results:  Lab Results  Component Value Date   WBC 8.8 04/30/2015   HGB 11.7* 04/30/2015   HCT 37.2* 04/30/2015   MCV 92.0 04/30/2015   PLT 419* 04/30/2015   NEUTROABS 6.3 04/30/2015   04/26/2015 cortisol 21.4, lipase 65, amylase 103,  LDH 275  Imaging:  No results found.  Medications: I have reviewed the patient's current medications.  Assessment/Plan: 1. 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 History of thrombocytopenia predating chemotherapy 3. T7 compression fracture-likely pathologic 4. 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 Right foot pain-likely secondary to nerve compression from the sacral mass, improved 6. Diffuse erythematous rash, appearance consistent with a "drug" rash-resolved 7. History of low-grade fever likely secondary to atelectasis/postop fever versus tumor fever 8. Hepatitis B core antibody positive 9. Altered mental status-likely secondary delirium in the setting of infection and hospitalization 10. Admission 04/18/2015 through 04/23/2015 presenting with fever; amylase and lipase elevated. Abdominal CT showed inflammation in the left upper quadrant and associated loculated fluid collections status post drainage procedure; cultures negative; question pancreatitis. 04/26/2015 lipase mildly elevated, amylase normal. 11. Bilateral lower extremity DVTs on a Doppler 04/18/2015, now on Lovenox.   Disposition: Mr. Howdeshell continues to have a poor performance status, etiology unclear, question lymphoma, question pancreatitis, question infection.   He will continue to work on his nutritonal status. He will continue physical therapy.  He will return for a follow-up visit on 05/06/2015. We will obtain repeat labs that day to include a CBC, chemistry panel, LDH, amylase and lipase. He and his wife understand to contact the office in the interim with any worsening of his condition. We specifically discussed fever, chills, other signs of infection.   Patient seen with Dr. Benay Spice. 25 minutes were spent face-to-face at today's visit with the majority of that time involved in counseling/coordination of  care.    Ned Card ANP/GNP-BC   04/30/2015  4:15 PM  This was a shared visit with Ned Card. Mr. Birdena Crandall continues to have failure to thrive. He has anorexia, intermittent diarrhea, and limited ability to ambulate. His symptoms are not typical of lymphoma progression, but this is in the differential diagnosis. He may be ill from the inflammatory process in the left upper quadrant that prompted the recent hospital admission. I doubt his symptoms are related to toxicity from chemotherapy.  He is not a candidate for further chemotherapy in his current condition. I will discuss the case with Dr. Excell Seltzer.  Julieanne Manson, M.D.

## 2015-05-01 DIAGNOSIS — G9340 Encephalopathy, unspecified: Secondary | ICD-10-CM | POA: Diagnosis not present

## 2015-05-01 DIAGNOSIS — D72829 Elevated white blood cell count, unspecified: Secondary | ICD-10-CM | POA: Diagnosis not present

## 2015-05-01 DIAGNOSIS — E440 Moderate protein-calorie malnutrition: Secondary | ICD-10-CM | POA: Diagnosis not present

## 2015-05-01 DIAGNOSIS — C8380 Other non-follicular lymphoma, unspecified site: Secondary | ICD-10-CM | POA: Diagnosis not present

## 2015-05-01 DIAGNOSIS — M21371 Foot drop, right foot: Secondary | ICD-10-CM | POA: Diagnosis not present

## 2015-05-01 DIAGNOSIS — L89302 Pressure ulcer of unspecified buttock, stage 2: Secondary | ICD-10-CM | POA: Diagnosis not present

## 2015-05-02 DIAGNOSIS — G9340 Encephalopathy, unspecified: Secondary | ICD-10-CM | POA: Diagnosis not present

## 2015-05-02 DIAGNOSIS — L89302 Pressure ulcer of unspecified buttock, stage 2: Secondary | ICD-10-CM | POA: Diagnosis not present

## 2015-05-02 DIAGNOSIS — D72829 Elevated white blood cell count, unspecified: Secondary | ICD-10-CM | POA: Diagnosis not present

## 2015-05-02 DIAGNOSIS — M21371 Foot drop, right foot: Secondary | ICD-10-CM | POA: Diagnosis not present

## 2015-05-02 DIAGNOSIS — C8380 Other non-follicular lymphoma, unspecified site: Secondary | ICD-10-CM | POA: Diagnosis not present

## 2015-05-02 DIAGNOSIS — E440 Moderate protein-calorie malnutrition: Secondary | ICD-10-CM | POA: Diagnosis not present

## 2015-05-06 ENCOUNTER — Other Ambulatory Visit (HOSPITAL_BASED_OUTPATIENT_CLINIC_OR_DEPARTMENT_OTHER): Payer: Medicare Other

## 2015-05-06 ENCOUNTER — Ambulatory Visit (HOSPITAL_BASED_OUTPATIENT_CLINIC_OR_DEPARTMENT_OTHER): Payer: Medicare Other | Admitting: Oncology

## 2015-05-06 ENCOUNTER — Telehealth: Payer: Self-pay | Admitting: Oncology

## 2015-05-06 VITALS — BP 95/45 | HR 128 | Temp 97.5°F | Resp 18 | Ht 72.0 in | Wt 131.9 lb

## 2015-05-06 DIAGNOSIS — R188 Other ascites: Secondary | ICD-10-CM | POA: Diagnosis not present

## 2015-05-06 DIAGNOSIS — C7951 Secondary malignant neoplasm of bone: Secondary | ICD-10-CM

## 2015-05-06 DIAGNOSIS — Z86718 Personal history of other venous thrombosis and embolism: Secondary | ICD-10-CM

## 2015-05-06 DIAGNOSIS — C8580 Other specified types of non-Hodgkin lymphoma, unspecified site: Secondary | ICD-10-CM

## 2015-05-06 DIAGNOSIS — C8339 Diffuse large B-cell lymphoma, extranodal and solid organ sites: Secondary | ICD-10-CM | POA: Diagnosis not present

## 2015-05-06 DIAGNOSIS — K5900 Constipation, unspecified: Secondary | ICD-10-CM

## 2015-05-06 LAB — AMYLASE: Amylase: 165 U/L — ABNORMAL HIGH (ref 0–105)

## 2015-05-06 LAB — LIPASE: Lipase: 57 U/L (ref 7–60)

## 2015-05-06 LAB — COMPREHENSIVE METABOLIC PANEL (CC13)
ALK PHOS: 71 U/L (ref 40–150)
ALT: 15 U/L (ref 0–55)
AST: 17 U/L (ref 5–34)
Albumin: 2.5 g/dL — ABNORMAL LOW (ref 3.5–5.0)
Anion Gap: 9 mEq/L (ref 3–11)
BILIRUBIN TOTAL: 0.44 mg/dL (ref 0.20–1.20)
BUN: 12.5 mg/dL (ref 7.0–26.0)
CO2: 21 mEq/L — ABNORMAL LOW (ref 22–29)
CREATININE: 0.8 mg/dL (ref 0.7–1.3)
Calcium: 9.1 mg/dL (ref 8.4–10.4)
Chloride: 106 mEq/L (ref 98–109)
EGFR: 89 mL/min/{1.73_m2} — AB (ref >90)
GLUCOSE: 137 mg/dL (ref 70–140)
Potassium: 4.2 mEq/L (ref 3.5–5.1)
SODIUM: 136 meq/L (ref 136–145)
TOTAL PROTEIN: 5.4 g/dL — AB (ref 6.4–8.3)

## 2015-05-06 LAB — CBC WITH DIFFERENTIAL/PLATELET
BASO%: 0.8 % (ref 0.0–2.0)
BASOS ABS: 0.1 10*3/uL (ref 0.0–0.1)
EOS ABS: 0.2 10*3/uL (ref 0.0–0.5)
EOS%: 1.8 % (ref 0.0–7.0)
HCT: 38.8 % (ref 38.4–49.9)
HEMOGLOBIN: 13 g/dL (ref 13.0–17.1)
LYMPH#: 1.5 10*3/uL (ref 0.9–3.3)
LYMPH%: 17.8 % (ref 14.0–49.0)
MCH: 29.8 pg (ref 27.2–33.4)
MCHC: 33.5 g/dL (ref 32.0–36.0)
MCV: 89 fL (ref 79.3–98.0)
MONO#: 0.9 10*3/uL (ref 0.1–0.9)
MONO%: 10.5 % (ref 0.0–14.0)
NEUT#: 5.8 10*3/uL (ref 1.5–6.5)
NEUT%: 69.1 % (ref 39.0–75.0)
NRBC: 0 % (ref 0–0)
Platelets: 340 10*3/uL (ref 140–400)
RBC: 4.36 10*6/uL (ref 4.20–5.82)
RDW: 18.8 % — AB (ref 11.0–14.6)
WBC: 8.4 10*3/uL (ref 4.0–10.3)

## 2015-05-06 LAB — LACTATE DEHYDROGENASE (CC13): LDH: 249 U/L — AB (ref 125–245)

## 2015-05-06 NOTE — Telephone Encounter (Signed)
Gave patient wife avs report and appointment for November. Per wife no tx being added at this time.

## 2015-05-06 NOTE — Progress Notes (Signed)
  [facility] OFFICE PROGRESS NOTE   Diagnosis: Non-Hodgkin's lymphoma  INTERVAL HISTORY:   [name] returns as scheduled. He complains of constipation. He reports an improved appetite. He is participated in in home physical therapy. He can ambulate short distances in the home. He takes one half of a Dilaudid tablet twice daily for relief of right foot pain.  Objective:  Vital signs in last 24 hours:  Blood pressure 95/45, pulse 128, temperature 97.5 F (36.4 C), temperature source Oral, resp. rate 18, height 6' (1.829 m), weight 131 lb 14.4 oz (59.829 kg), SpO2 94 %.    HEENT: No thrush or ulcers Resp: Lungs clear bilaterally Cardio: Regular rate and rhythm GI: Mild tenderness in the left upper abdomen, no mass, no hepatomegaly Vascular: No leg edema Neuro: 2-3 of 5 strength with dorsi flexion at the right foot  Skin: Superficial skin breakdown at the sacrum   Portacath/PICC-without erythema  Lab Results:  Lab Results  Component Value Date   WBC 8.4 05/06/2015   HGB 13.0 05/06/2015   HCT 38.8 05/06/2015   MCV 89.0 05/06/2015   PLT 340 05/06/2015   NEUTROABS 5.8 05/06/2015     Medications: I have reviewed the patient's current medications.  Assessment/Plan: 1. 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 History of thrombocytopenia predating chemotherapy 3. T7 compression  fracture-likely pathologic 4. 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 Right foot pain-likely secondary to nerve compression from the sacral mass, improved 6. Diffuse erythematous rash, appearance consistent with a "drug" rash-resolved 7. History of low-grade fever likely secondary to atelectasis/postop fever versus tumor fever 8. Hepatitis B core antibody positive 9. Altered mental status-likely secondary delirium in the setting of infection and hospitalization 10. Admission 04/18/2015 through 04/23/2015 presenting with fever; amylase and lipase elevated. Abdominal CT showed inflammation in the left upper quadrant and associated loculated fluid collections status post drainage procedure; cultures negative; question pancreatitis. 04/26/2015 lipase mildly elevated, amylase normal. 11. Bilateral lower extremity DVTs on a Doppler 04/18/2015, now on Lovenox.    Disposition:  Mr. Maiello appears to have a partially improved performance status today. I encouraged him to increase his diet and ambulation as tolerated. 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 I will discuss the case with Dr. Excell Seltzer. Mr. Apolinar will return for an office visit on 05/09/2015. Chemotherapy remains on hold. He will try MiraLAX for the constipation.  Betsy Coder, MD  05/06/2015  6:11 PM

## 2015-05-08 DIAGNOSIS — M21371 Foot drop, right foot: Secondary | ICD-10-CM | POA: Diagnosis not present

## 2015-05-08 DIAGNOSIS — C8380 Other non-follicular lymphoma, unspecified site: Secondary | ICD-10-CM | POA: Diagnosis not present

## 2015-05-08 DIAGNOSIS — D72829 Elevated white blood cell count, unspecified: Secondary | ICD-10-CM | POA: Diagnosis not present

## 2015-05-08 DIAGNOSIS — G9340 Encephalopathy, unspecified: Secondary | ICD-10-CM | POA: Diagnosis not present

## 2015-05-08 DIAGNOSIS — E440 Moderate protein-calorie malnutrition: Secondary | ICD-10-CM | POA: Diagnosis not present

## 2015-05-08 DIAGNOSIS — L89302 Pressure ulcer of unspecified buttock, stage 2: Secondary | ICD-10-CM | POA: Diagnosis not present

## 2015-05-09 ENCOUNTER — Other Ambulatory Visit: Payer: Self-pay | Admitting: Medical Oncology

## 2015-05-09 ENCOUNTER — Other Ambulatory Visit: Payer: Self-pay | Admitting: *Deleted

## 2015-05-09 ENCOUNTER — Ambulatory Visit (HOSPITAL_BASED_OUTPATIENT_CLINIC_OR_DEPARTMENT_OTHER): Payer: Medicare Other | Admitting: Oncology

## 2015-05-09 VITALS — BP 95/65 | HR 100 | Resp 20 | Ht 73.0 in | Wt 130.6 lb

## 2015-05-09 DIAGNOSIS — I951 Orthostatic hypotension: Secondary | ICD-10-CM

## 2015-05-09 DIAGNOSIS — I82401 Acute embolism and thrombosis of unspecified deep veins of right lower extremity: Secondary | ICD-10-CM | POA: Diagnosis not present

## 2015-05-09 DIAGNOSIS — R627 Adult failure to thrive: Secondary | ICD-10-CM | POA: Diagnosis not present

## 2015-05-09 DIAGNOSIS — R109 Unspecified abdominal pain: Secondary | ICD-10-CM

## 2015-05-09 DIAGNOSIS — E860 Dehydration: Secondary | ICD-10-CM

## 2015-05-09 DIAGNOSIS — C8580 Other specified types of non-Hodgkin lymphoma, unspecified site: Secondary | ICD-10-CM

## 2015-05-09 LAB — TSH CHCC: TSH: 4.771 m(IU)/L — ABNORMAL HIGH (ref 0.320–4.118)

## 2015-05-09 MED ORDER — SODIUM CHLORIDE 0.9 % IV SOLN
INTRAVENOUS | Status: AC
  Administered 2015-05-09: 12:00:00 via INTRAVENOUS

## 2015-05-09 MED ORDER — HYDROMORPHONE HCL 2 MG PO TABS
1.0000 mg | ORAL_TABLET | Freq: Once | ORAL | Status: AC
  Administered 2015-05-09: 1 mg via ORAL
  Filled 2015-05-09: qty 1

## 2015-05-09 NOTE — Progress Notes (Signed)
  [facility] OFFICE PROGRESS NOTE   Diagnosis: Non-Hodgkin's lymphoma  INTERVAL HISTORY:   Mr. Oak returns as scheduled. He feels "weak ". He has difficulty walking in his home. He was not able to participate in physical therapy yesterday. His appetite has improved. He had a bowel movement after MiraLAX. Mild abdominal discomfort. No fever.  Objective:  Vital signs in last 24 hours:  Blood pressure 82/50, pulse 117, resp. rate 20, height $RemoveBe'6\' 1"'HvPEqxRBV$  (1.854 m), weight 130 lb 9.6 oz (59.24 kg), SpO2 96 %.    HEENT: The mucous membranes are dry, no thrush Lymphatics: Less than 1 cm soft mobile right axillary node. No other palpable lymph nodes. Resp: Lungs clear bilaterally Cardio: Regular rate and rhythm GI: No hepatomegaly, no mass, no apparent ascites, nontender Vascular: Trace pitting edema at the right lower leg Neuro: Right foot drop, alert, follows commands    Lab Results:  Lab Results  Component Value Date   WBC 8.4 05/06/2015   HGB 13.0 05/06/2015   HCT 38.8 05/06/2015   MCV 89.0 05/06/2015   PLT 340 05/06/2015   NEUTROABS 5.8 05/06/2015     Medications: I have reviewed the patient's current medications.  Assessment/Plan: 1. 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 History of thrombocytopenia predating chemotherapy 3. T7 compression fracture-likely pathologic 4. Right  foot drop-likely secondary to nerve compression from the right sacral mass 5. Right foot pain-likely secondary to nerve compression from the sacral mass, improved 6. Diffuse erythematous rash, appearance consistent with a "drug" rash-resolved 7. History of low-grade fever likely secondary to atelectasis/postop fever versus tumor fever 8. Hepatitis B core antibody positive 9. Altered mental status-likely secondary delirium in the setting of infection and hospitalization 10. Admission 04/18/2015 through 04/23/2015 presenting with fever; amylase and lipase elevated. Abdominal CT showed inflammation in the left upper quadrant and associated loculated fluid collections status post drainage procedure, fluid with high levels of amylase and lipase; cultures negative; question pancreatitis. 04/26/2015 lipase mildly elevated, amylase normal. 11. Bilateral lower extremity DVTs on a Doppler 04/18/2015, now on Lovenox.   Disposition:  Mr. Geiler continues to have failure to thrive. He is orthostatic today. I am concerned the inflammatory process in his abdomen is causing the current symptom complex. I have a low clinical suspicion for progression of non-Hodgkin's lymphoma. I discussed the case with gastroenterology. They recommended an MRI of the abdomen to assess the pancreas and pancreatic duct. We will try to get this scheduled in the next few days.  We will arrange for Mr. Decola to return to the [facility] for IV fluids the next 2 days. Betsy Coder, MD  05/09/2015  11:01 AM

## 2015-05-10 ENCOUNTER — Ambulatory Visit (HOSPITAL_BASED_OUTPATIENT_CLINIC_OR_DEPARTMENT_OTHER): Payer: Medicare Other

## 2015-05-10 ENCOUNTER — Telehealth: Payer: Self-pay | Admitting: Oncology

## 2015-05-10 ENCOUNTER — Other Ambulatory Visit: Payer: Self-pay | Admitting: *Deleted

## 2015-05-10 VITALS — BP 109/58 | HR 93 | Resp 24

## 2015-05-10 DIAGNOSIS — E860 Dehydration: Secondary | ICD-10-CM | POA: Diagnosis not present

## 2015-05-10 DIAGNOSIS — C8580 Other specified types of non-Hodgkin lymphoma, unspecified site: Secondary | ICD-10-CM

## 2015-05-10 MED ORDER — SODIUM CHLORIDE 0.9 % IV SOLN
Freq: Once | INTRAVENOUS | Status: AC
  Administered 2015-05-10: 13:00:00 via INTRAVENOUS

## 2015-05-10 NOTE — Telephone Encounter (Signed)
Added lab/LT per 11/3 pof - time per desk nurse. IVF's for 11/5 added by inf scheduler. Per desk nurse patient aware of dates/times and will get avs report/appointments in inf area.

## 2015-05-10 NOTE — Patient Instructions (Signed)

## 2015-05-11 ENCOUNTER — Ambulatory Visit (HOSPITAL_BASED_OUTPATIENT_CLINIC_OR_DEPARTMENT_OTHER): Payer: Medicare Other

## 2015-05-11 VITALS — BP 106/67 | HR 78 | Temp 97.8°F | Resp 18

## 2015-05-11 DIAGNOSIS — R627 Adult failure to thrive: Secondary | ICD-10-CM | POA: Diagnosis not present

## 2015-05-11 DIAGNOSIS — C8580 Other specified types of non-Hodgkin lymphoma, unspecified site: Secondary | ICD-10-CM

## 2015-05-11 DIAGNOSIS — I951 Orthostatic hypotension: Secondary | ICD-10-CM | POA: Diagnosis not present

## 2015-05-11 MED ORDER — SODIUM CHLORIDE 0.9 % IV SOLN
INTRAVENOUS | Status: AC
  Administered 2015-05-11: 08:00:00 via INTRAVENOUS

## 2015-05-11 NOTE — Patient Instructions (Signed)

## 2015-05-13 ENCOUNTER — Ambulatory Visit (HOSPITAL_BASED_OUTPATIENT_CLINIC_OR_DEPARTMENT_OTHER): Payer: Medicare Other | Admitting: Nurse Practitioner

## 2015-05-13 ENCOUNTER — Telehealth: Payer: Self-pay | Admitting: Oncology

## 2015-05-13 ENCOUNTER — Other Ambulatory Visit (HOSPITAL_BASED_OUTPATIENT_CLINIC_OR_DEPARTMENT_OTHER): Payer: Medicare Other

## 2015-05-13 VITALS — BP 110/65 | HR 107 | Resp 17 | Ht 73.0 in | Wt 132.2 lb

## 2015-05-13 DIAGNOSIS — D709 Neutropenia, unspecified: Secondary | ICD-10-CM

## 2015-05-13 DIAGNOSIS — C8580 Other specified types of non-Hodgkin lymphoma, unspecified site: Secondary | ICD-10-CM | POA: Diagnosis not present

## 2015-05-13 DIAGNOSIS — R109 Unspecified abdominal pain: Secondary | ICD-10-CM

## 2015-05-13 DIAGNOSIS — I82403 Acute embolism and thrombosis of unspecified deep veins of lower extremity, bilateral: Secondary | ICD-10-CM

## 2015-05-13 DIAGNOSIS — R188 Other ascites: Secondary | ICD-10-CM

## 2015-05-13 LAB — CBC WITH DIFFERENTIAL/PLATELET
BASO%: 3 % — ABNORMAL HIGH (ref 0.0–2.0)
BASOS ABS: 0.1 10*3/uL (ref 0.0–0.1)
EOS%: 5.9 % (ref 0.0–7.0)
Eosinophils Absolute: 0.3 10*3/uL (ref 0.0–0.5)
HEMATOCRIT: 38 % — AB (ref 38.4–49.9)
HEMOGLOBIN: 12.6 g/dL — AB (ref 13.0–17.1)
LYMPH#: 2.8 10*3/uL (ref 0.9–3.3)
LYMPH%: 62.5 % — ABNORMAL HIGH (ref 14.0–49.0)
MCH: 30.1 pg (ref 27.2–33.4)
MCHC: 33.2 g/dL (ref 32.0–36.0)
MCV: 90.7 fL (ref 79.3–98.0)
MONO#: 1.1 10*3/uL — AB (ref 0.1–0.9)
MONO%: 25.6 % — ABNORMAL HIGH (ref 0.0–14.0)
NEUT#: 0.1 10*3/uL — CL (ref 1.5–6.5)
NEUT%: 3 % — AB (ref 39.0–75.0)
Platelets: 309 10*3/uL (ref 140–400)
RBC: 4.19 10*6/uL — ABNORMAL LOW (ref 4.20–5.82)
RDW: 18.9 % — ABNORMAL HIGH (ref 11.0–14.6)
WBC: 4.4 10*3/uL (ref 4.0–10.3)
nRBC: 0 % (ref 0–0)

## 2015-05-13 MED ORDER — CIPROFLOXACIN HCL 500 MG PO TABS: 500.0000 mg | ORAL_TABLET | Freq: Two times a day (BID) | ORAL | Status: DC

## 2015-05-13 NOTE — Progress Notes (Addendum)
[facility] OFFICE PROGRESS NOTE   Diagnosis:   Non-Hodgkin's lymphoma  INTERVAL HISTORY:    Perry Nielsen returns as scheduled. Appetite is better. Energy level continues to be poor. He is no longer having diarrhea. He has occasional abdominal pain. No fever or chills.  Objective:  Vital signs in last 24 hours:  Blood pressure 110/65, pulse 107, resp. rate 17, height _0  (1.854 m), weight 132 lb 3.2 oz (59.966 kg), SpO2 95 %.    HEENT:  No thrush or ulcers. Resp:  Lungs clear bilaterally. Cardio:  Regular rate and rhythm. GI:  Abdomen soft and nontender. No hepatomegaly. No mass. Vascular:  Pitting edema right lower leg. Neuro:  Right foot weakness.    Lab Results:  Lab Results  Component Value Date   WBC 4.4 05/13/2015   HGB 12.6* 05/13/2015   HCT 38.0* 05/13/2015   MCV 90.7 05/13/2015   PLT 309 05/13/2015   NEUTROABS 0.1* 05/13/2015    Imaging:  No results found.  Medications: I have reviewed the patient's current medications.  Assessment/Plan: 1. 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 History of thrombocytopenia predating chemotherapy 3. T7 compression fracture-likely pathologic 4. 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 Right foot pain-likely secondary to nerve compression from the sacral mass, improved 6. Diffuse  erythematous rash, appearance consistent with a "drug" rash-resolved 7. History of low-grade fever likely secondary to atelectasis/postop fever versus tumor fever 8. Hepatitis B core antibody positive 9. Altered mental status-likely secondary delirium in the setting of infection and hospitalization 10. Admission 04/18/2015 through 04/23/2015 presenting with fever; amylase and lipase elevated. Abdominal CT showed inflammation in the left upper quadrant and associated loculated fluid collections status post drainage procedure, fluid with high levels of amylase and lipase; cultures negative; question pancreatitis. 04/26/2015 lipase mildly elevated, amylase normal. 11. Bilateral lower extremity DVTs on a Doppler 04/18/2015, now on Lovenox.   Disposition:  Perry Nielsen continues to have a poor performance status. He is significantly deconditioned. He is participating in home physical therapy. He may benefit from a more intensive physical therapy program.  Labs today show severe neutropenia. This is likely a delayed effect of Rituxan. He will begin ciprofloxacin 500 mg twice daily. Neutropenic precautions were reviewed with him and his wife. They understand to contact the office with fever, chills, other signs of infection. We will repeat a CBC on 05/15/2015.  We are referring him for CT scans of the abdomen and pelvis to follow-up the pancreatic fluid collection.  He will return for a follow-up visit 05/15/2015. He will contact the office in the interim as outlined above or with any other problems.  Patient seen with Dr. Benay Spice. 25 minutes were spent face-to-face at today's visit with the majority of that time involved in counseling/coordination of care.    Perry Nielsen ANP/GNP-BC   05/13/2015  3:39 PM  This was a shared visit with Perry Nielsen. Perry Nielsen was interviewed and examined. His appetite has improved, but he continues to have a poor  performance status. I discussed the case with Dr.  Excell Seltzer. He will be referred for a restaging CT abdomen and return for an office visit on 05/15/2015. I continue to have a low clinical suspicion for progression of lymphoma. The neutropenia is likely delayed toxicity from rituximab.  Perry Nielsen, M.D.

## 2015-05-13 NOTE — Telephone Encounter (Signed)
Gave patient/wife avs report and appointments for November including ct for tomorrow. Per 11/7 pof mri for 11/15 cxd.

## 2015-05-14 ENCOUNTER — Ambulatory Visit (HOSPITAL_COMMUNITY)
Admission: RE | Admit: 2015-05-14 | Discharge: 2015-05-14 | Disposition: A | Discharge status: Home / Self Care | Payer: Medicare Other | Source: Ambulatory Visit | Attending: Nurse Practitioner | Admitting: Nurse Practitioner

## 2015-05-14 ENCOUNTER — Encounter (HOSPITAL_COMMUNITY): Payer: Self-pay

## 2015-05-14 DIAGNOSIS — I708 Atherosclerosis of other arteries: Secondary | ICD-10-CM | POA: Diagnosis not present

## 2015-05-14 DIAGNOSIS — D72829 Elevated white blood cell count, unspecified: Secondary | ICD-10-CM | POA: Diagnosis not present

## 2015-05-14 DIAGNOSIS — I77819 Aortic ectasia, unspecified site: Secondary | ICD-10-CM | POA: Insufficient documentation

## 2015-05-14 DIAGNOSIS — R188 Other ascites: Secondary | ICD-10-CM | POA: Diagnosis not present

## 2015-05-14 DIAGNOSIS — C8380 Other non-follicular lymphoma, unspecified site: Secondary | ICD-10-CM | POA: Diagnosis not present

## 2015-05-14 DIAGNOSIS — L89302 Pressure ulcer of unspecified buttock, stage 2: Secondary | ICD-10-CM | POA: Diagnosis not present

## 2015-05-14 DIAGNOSIS — E440 Moderate protein-calorie malnutrition: Secondary | ICD-10-CM | POA: Diagnosis not present

## 2015-05-14 DIAGNOSIS — R918 Other nonspecific abnormal finding of lung field: Secondary | ICD-10-CM | POA: Diagnosis not present

## 2015-05-14 DIAGNOSIS — C8580 Other specified types of non-Hodgkin lymphoma, unspecified site: Secondary | ICD-10-CM | POA: Diagnosis not present

## 2015-05-14 DIAGNOSIS — I77811 Abdominal aortic ectasia: Secondary | ICD-10-CM | POA: Diagnosis not present

## 2015-05-14 DIAGNOSIS — G9340 Encephalopathy, unspecified: Secondary | ICD-10-CM | POA: Diagnosis not present

## 2015-05-14 DIAGNOSIS — M21371 Foot drop, right foot: Secondary | ICD-10-CM | POA: Diagnosis not present

## 2015-05-14 DIAGNOSIS — C8590 Non-Hodgkin lymphoma, unspecified, unspecified site: Secondary | ICD-10-CM | POA: Diagnosis not present

## 2015-05-14 MED ORDER — IOHEXOL 300 MG/ML  SOLN
100.0000 mL | Freq: Once | INTRAMUSCULAR | Status: AC | PRN
  Administered 2015-05-14: 100 mL via INTRAVENOUS

## 2015-05-15 ENCOUNTER — Other Ambulatory Visit (HOSPITAL_BASED_OUTPATIENT_CLINIC_OR_DEPARTMENT_OTHER): Payer: Medicare Other

## 2015-05-15 ENCOUNTER — Other Ambulatory Visit: Payer: Self-pay | Admitting: Nurse Practitioner

## 2015-05-15 ENCOUNTER — Ambulatory Visit (HOSPITAL_BASED_OUTPATIENT_CLINIC_OR_DEPARTMENT_OTHER): Payer: Medicare Other | Admitting: Nurse Practitioner

## 2015-05-15 ENCOUNTER — Telehealth: Payer: Self-pay | Admitting: Oncology

## 2015-05-15 ENCOUNTER — Ambulatory Visit (HOSPITAL_COMMUNITY)
Admission: RE | Admit: 2015-05-15 | Discharge: 2015-05-15 | Disposition: A | Discharge status: Home / Self Care | Payer: Medicare Other | Source: Ambulatory Visit | Attending: Nurse Practitioner | Admitting: Nurse Practitioner

## 2015-05-15 VITALS — BP 97/67 | HR 72 | Resp 18 | Ht 73.0 in | Wt 132.4 lb

## 2015-05-15 DIAGNOSIS — D709 Neutropenia, unspecified: Secondary | ICD-10-CM

## 2015-05-15 DIAGNOSIS — I82403 Acute embolism and thrombosis of unspecified deep veins of lower extremity, bilateral: Secondary | ICD-10-CM

## 2015-05-15 DIAGNOSIS — R05 Cough: Secondary | ICD-10-CM | POA: Insufficient documentation

## 2015-05-15 DIAGNOSIS — C8580 Other specified types of non-Hodgkin lymphoma, unspecified site: Secondary | ICD-10-CM

## 2015-05-15 DIAGNOSIS — J189 Pneumonia, unspecified organism: Secondary | ICD-10-CM

## 2015-05-15 DIAGNOSIS — R918 Other nonspecific abnormal finding of lung field: Secondary | ICD-10-CM | POA: Insufficient documentation

## 2015-05-15 DIAGNOSIS — R109 Unspecified abdominal pain: Secondary | ICD-10-CM | POA: Diagnosis not present

## 2015-05-15 DIAGNOSIS — M79671 Pain in right foot: Secondary | ICD-10-CM

## 2015-05-15 LAB — CBC WITH DIFFERENTIAL/PLATELET
BASO%: 1.4 % (ref 0.0–2.0)
Basophils Absolute: 0.1 10*3/uL (ref 0.0–0.1)
EOS%: 4.9 % (ref 0.0–7.0)
Eosinophils Absolute: 0.2 10*3/uL (ref 0.0–0.5)
HEMATOCRIT: 41.5 % (ref 38.4–49.9)
HEMOGLOBIN: 13.2 g/dL (ref 13.0–17.1)
LYMPH#: 2 10*3/uL (ref 0.9–3.3)
LYMPH%: 45.5 % (ref 14.0–49.0)
MCH: 29.6 pg (ref 27.2–33.4)
MCHC: 31.8 g/dL — AB (ref 32.0–36.0)
MCV: 93 fL (ref 79.3–98.0)
MONO#: 1.2 10*3/uL — ABNORMAL HIGH (ref 0.1–0.9)
MONO%: 28.6 % — ABNORMAL HIGH (ref 0.0–14.0)
NEUT#: 0.8 10*3/uL — ABNORMAL LOW (ref 1.5–6.5)
NEUT%: 19.6 % — ABNORMAL LOW (ref 39.0–75.0)
Platelets: 281 10*3/uL (ref 140–400)
RBC: 4.46 10*6/uL (ref 4.20–5.82)
RDW: 19.1 % — AB (ref 11.0–14.6)
WBC: 4.3 10*3/uL (ref 4.0–10.3)

## 2015-05-15 MED ORDER — HYDROMORPHONE HCL 2 MG PO TABS: 2.0000 mg | ORAL_TABLET | ORAL | Status: DC | PRN

## 2015-05-15 MED ORDER — SULFAMETHOXAZOLE-TRIMETHOPRIM 800-160 MG PO TABS: 1.0000 | ORAL_TABLET | Freq: Two times a day (BID) | ORAL | Status: DC

## 2015-05-15 NOTE — Telephone Encounter (Signed)
Gave patient avs report and appointments for November. Patient sent to [facility] rad for cxr.

## 2015-05-15 NOTE — Progress Notes (Addendum)
[facility] OFFICE PROGRESS NOTE   Diagnosis:  Non-Hodgkin's lymphoma  INTERVAL HISTORY:   Perry Nielsen returns as scheduled. Appetite continues to be improved. Stable right foot pain. Occasional pain left abdomen. No fever. Periodic cough. No shortness of breath. He is having 3-4 bowel movements a day. He continues Miralax. No nausea or vomiting.  Objective:  Vital signs in last 24 hours:  Blood pressure 97/67, pulse 72, resp. rate 18, height '6\' 1"'  (1.854 m), weight 132 lb 6.4 oz (60.056 kg), SpO2 93 %.    HEENT: No thrush or ulcers. Resp: Lungs clear bilaterally. No respiratory distress. Cardio: Regular rate and rhythm. GI: Abdomen soft and nontender. No organomegaly. No mass. Vascular: Pitting edema right lower leg. Neuro: Alert and oriented. Moves all extremities. Persistent weakness right foot.     Lab Results:  Lab Results  Component Value Date   WBC 4.3 05/15/2015   HGB 13.2 05/15/2015   HCT 41.5 05/15/2015   MCV 93.0 05/15/2015   PLT 281 05/15/2015   NEUTROABS 0.8* 05/15/2015    Imaging:  Ct Abdomen Pelvis W Contrast  05/14/2015  CLINICAL DATA:  Non-Hodgkin's lymphoma status post splenectomy on 03/05/2015. Follow-up pancreatic tail fluid collection status post percutaneous drainage on 04/19/2015. EXAM: CT ABDOMEN AND PELVIS WITH CONTRAST TECHNIQUE: Multidetector CT imaging of the abdomen and pelvis was performed using the standard protocol following bolus administration of intravenous contrast. CONTRAST:  165m OMNIPAQUE IOHEXOL 300 MG/ML  SOLN COMPARISON:  04/18/2015 CT abdomen/pelvis. FINDINGS: Lower chest: There is extensive patchy ground-glass opacity in a peribronchovascular and perilobular distribution throughout both lung bases, significantly increased in the interval. Hepatobiliary: Normal liver with no liver mass. Normal gallbladder with no radiopaque cholelithiasis. No biliary ductal dilatation. Pancreas: There is a persistent 6.5 x 3.8 cm  fluid collection at the posterior pancreatic tail (series 2/image 19), not appreciably changed in size since the 04/18/2015 CT, and demonstrating stable mild wall thickening without solid enhancing internal component. There are additional smaller 3.0 x 1.4 cm and 2.4 x 1.4 cm fluid collections in the far lateral left upper quadrant adjacent lateral to the proximal descending colon, which have become more well-defined since the 04/18/15 scan in demonstrate no internal gas or solid enhancing components. No main pancreatic duct dilation. There is decreased peripancreatic fat stranding in the left upper quadrant. Spleen: Status post splenectomy. Decreasing fat stranding in the splenectomy bed. Adrenals/Urinary Tract: Normal adrenals. No hydronephrosis. Simple 3.4 cm partially exophytic renal cyst in the anterior upper right kidney. Simple 1.0 cm renal cyst in the lateral upper right kidney. Simple 2.3 cm renal cyst in the anterior upper left kidney. Normal bladder. Stomach/Bowel: Grossly normal stomach. Normal caliber small bowel with no small bowel wall thickening. Appendix is not discretely visualized. Normal large bowel with no diverticulosis, large bowel wall thickening or pericolonic fat stranding. Vascular/Lymphatic: Ectasia of the atherosclerotic infrarenal abdominal aorta, maximum diameter 2.8 cm. Patent portal, splenic, hepatic and renal veins. No pathologically enlarged lymph nodes in the abdomen or pelvis. Reproductive: Stable mild prostatomegaly with nonspecific internal prostatic calcifications. Other: No pneumoperitoneum or ascites. Musculoskeletal: Stable mildly expansile lytic lesion in the upper right sacrum. No new osseous lesions. Moderate degenerative changes in the visualized thoracolumbar spine. IMPRESSION: 1. 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 2. Additional smaller fluid collections in the far lateral left upper quadrant lateral to the  proximal descending colon, which of the common more well-defined in the interval, and likely represent additional small pancreatic pseudocysts or postoperative seromas. 3. Extensive patchy ground-glass opacity at the lung bases, significantly increased, likely a nonspecific inflammatory process, with the differential including organizing pneumonia given the distribution. Recommend follow-up chest CT in 3 months. 4. Stable expansile lytic lesion in the upper right sacrum. No new osseous lesions. 5. 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 Ectatic abdominal aorta at risk for aneurysm development. Recommend followup by ultrasound in 5 years. This recommendation follows ACR consensus guidelines: White Paper of the ACR Incidental Findings Committee II on Vascular Findings. J Am Coll Radiol 2013; 10:789-794. Electronically Signed   By: Ilona Sorrel M.D.   On: 05/14/2015 12:15    Medications: I have reviewed the patient's current medications.  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 History of thrombocytopenia predating chemotherapy 3. T7 compression fracture-likely pathologic 4. 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 Right foot  pain-likely secondary to nerve compression from the sacral mass, improved 6. Diffuse erythematous rash, appearance consistent with a "drug" rash-resolved 7. History of low-grade fever likely secondary to atelectasis/postop fever versus tumor fever 8. Hepatitis B core antibody positive 9. Altered mental status-likely secondary delirium in the setting of infection and hospitalization 10. Admission 04/18/2015 through 04/23/2015 presenting with fever; amylase and lipase elevated.   Abdominal CT showed inflammation in the left upper quadrant and associated loculated fluid collections status post drainage procedure, fluid with high levels of amylase and lipase; cultures negative; question pancreatitis.   04/26/2015 lipase mildly elevated, amylase normal.   CT abdomen/pelvis 05/14/2015 with a persistent 6.5 x 3.8 cm posterior pancreatic tail fluid collection similar in size to the 04/18/2015 CT study. Additional smaller fluid collections in the far lateral left upper quadrant; extensive patchy groundglass opacity at the lung bases, significantly increased; stable expansile lytic lesion in the upper right sacrum. 11. Bilateral lower extremity DVTs on a Doppler 04/18/2015, now on Lovenox. 12. Severe neutropenia 05/13/2015. Likely delayed effect of Rituxan. Improved 05/15/2015.   Disposition: Perry Nielsen appears to be slowly improving from the current illness felt to likely be pancreatitis. We discussed the importance of maximizing his nutritional status and activity level. At present his performance status is not adequate to resume chemotherapy.  The etiology of the inflammation in the lungs is unclear, question pneumonia, question pneumonitis. We are referring him for a follow-up chest x-ray.   He will return for labs and an office visit in one week. He will contact the office in the interim with any problems. We specifically discussed fever, change in respiratory status.  Patient seen with Dr.  Benay Spice. 25 minutes were spent face-to-face at today's visit with the majority of that time involved in counseling/coordination of care. CT images were reviewed on the computer with Perry Nielsen and his wife.   Ned Card ANP/GNP-BC   05/15/2015  11:38 AM  This was a shared visit with Ned Card. Perry Nielsen was interviewed and examined.  The restaging CT reveals improved inflammatory changes in the abdomen with stable cystic areas. I discussed the case with Dr. Excell Seltzer. The plan is to observe the fluid collections. Perry Nielsen has an improved appetite and does not have significant abdominal pain.  The CT reveals inflammatory changes at the lung bases. He could have pneumonia or pneumonitis. He will be referred for a chest x-ray today. He will continue antibiotics. The plan is to begin a course of steroid superior are diffuse inflammatory changes on the chest x-ray.  Julieanne Manson, M.D.

## 2015-05-16 DIAGNOSIS — G9340 Encephalopathy, unspecified: Secondary | ICD-10-CM | POA: Diagnosis not present

## 2015-05-16 DIAGNOSIS — L89302 Pressure ulcer of unspecified buttock, stage 2: Secondary | ICD-10-CM | POA: Diagnosis not present

## 2015-05-16 DIAGNOSIS — C8380 Other non-follicular lymphoma, unspecified site: Secondary | ICD-10-CM | POA: Diagnosis not present

## 2015-05-16 DIAGNOSIS — M21371 Foot drop, right foot: Secondary | ICD-10-CM | POA: Diagnosis not present

## 2015-05-16 DIAGNOSIS — E440 Moderate protein-calorie malnutrition: Secondary | ICD-10-CM | POA: Diagnosis not present

## 2015-05-16 DIAGNOSIS — D72829 Elevated white blood cell count, unspecified: Secondary | ICD-10-CM | POA: Diagnosis not present

## 2015-05-20 ENCOUNTER — Other Ambulatory Visit (HOSPITAL_BASED_OUTPATIENT_CLINIC_OR_DEPARTMENT_OTHER): Payer: Medicare Other

## 2015-05-20 ENCOUNTER — Ambulatory Visit (HOSPITAL_BASED_OUTPATIENT_CLINIC_OR_DEPARTMENT_OTHER): Payer: Medicare Other | Admitting: Nurse Practitioner

## 2015-05-20 ENCOUNTER — Telehealth: Payer: Self-pay | Admitting: Nurse Practitioner

## 2015-05-20 VITALS — BP 106/72 | HR 62 | Resp 18 | Ht 73.0 in | Wt 130.4 lb

## 2015-05-20 DIAGNOSIS — D709 Neutropenia, unspecified: Secondary | ICD-10-CM

## 2015-05-20 DIAGNOSIS — C8339 Diffuse large B-cell lymphoma, extranodal and solid organ sites: Secondary | ICD-10-CM | POA: Diagnosis not present

## 2015-05-20 DIAGNOSIS — R05 Cough: Secondary | ICD-10-CM

## 2015-05-20 DIAGNOSIS — C8580 Other specified types of non-Hodgkin lymphoma, unspecified site: Secondary | ICD-10-CM | POA: Diagnosis not present

## 2015-05-20 LAB — COMPREHENSIVE METABOLIC PANEL (CC13)
ALBUMIN: 2.5 g/dL — AB (ref 3.5–5.0)
ALK PHOS: 58 U/L (ref 40–150)
ALT: 13 U/L (ref 0–55)
AST: 15 U/L (ref 5–34)
Anion Gap: 10 mEq/L (ref 3–11)
BILIRUBIN TOTAL: 0.31 mg/dL (ref 0.20–1.20)
BUN: 11.8 mg/dL (ref 7.0–26.0)
CALCIUM: 8.7 mg/dL (ref 8.4–10.4)
CHLORIDE: 105 meq/L (ref 98–109)
CO2: 21 mEq/L — ABNORMAL LOW (ref 22–29)
Creatinine: 0.8 mg/dL (ref 0.7–1.3)
EGFR: 89 mL/min/{1.73_m2} — AB (ref >90)
Glucose: 92 mg/dl (ref 70–140)
POTASSIUM: 4.3 meq/L (ref 3.5–5.1)
SODIUM: 136 meq/L (ref 136–145)
Total Protein: 5.6 g/dL — ABNORMAL LOW (ref 6.4–8.3)

## 2015-05-20 LAB — CBC WITH DIFFERENTIAL/PLATELET
BASO%: 0.9 % (ref 0.0–2.0)
BASOS ABS: 0.1 10*3/uL (ref 0.0–0.1)
EOS%: 3.7 % (ref 0.0–7.0)
Eosinophils Absolute: 0.4 10*3/uL (ref 0.0–0.5)
HEMATOCRIT: 41.6 % (ref 38.4–49.9)
HEMOGLOBIN: 13.8 g/dL (ref 13.0–17.1)
LYMPH%: 28.2 % (ref 14.0–49.0)
MCH: 29.9 pg (ref 27.2–33.4)
MCHC: 33.2 g/dL (ref 32.0–36.0)
MCV: 90 fL (ref 79.3–98.0)
MONO#: 1.3 10*3/uL — ABNORMAL HIGH (ref 0.1–0.9)
MONO%: 12.4 % (ref 0.0–14.0)
NEUT#: 5.7 10*3/uL (ref 1.5–6.5)
NEUT%: 54.8 % (ref 39.0–75.0)
Platelets: 267 10*3/uL (ref 140–400)
RBC: 4.62 10*6/uL (ref 4.20–5.82)
RDW: 17.5 % — ABNORMAL HIGH (ref 11.0–14.6)
WBC: 10.4 10*3/uL — ABNORMAL HIGH (ref 4.0–10.3)
lymph#: 2.9 10*3/uL (ref 0.9–3.3)
nRBC: 0 % (ref 0–0)

## 2015-05-20 LAB — LIPASE: LIPASE: 67 U/L — AB (ref 7–60)

## 2015-05-20 LAB — AMYLASE: AMYLASE: 112 U/L — AB (ref 0–105)

## 2015-05-20 LAB — LACTATE DEHYDROGENASE (CC13): LDH: 228 U/L (ref 125–245)

## 2015-05-20 NOTE — Telephone Encounter (Signed)
per pof to sch pt appt-gave pt copy of avs °

## 2015-05-20 NOTE — Progress Notes (Addendum)
[place] OFFICE PROGRESS NOTE   Diagnosis:  Non-Hodgkin's lymphoma  INTERVAL HISTORY:   Perry Nielsen returns as scheduled. He continues to have a good appetite. He has occasional abdominal pain. No fevers or sweats. He is short of breath periodically. He has an occasional cough. He has been constipated for the past 3 days. He is taking Miralax.  Objective:  Vital signs in last 24 hours:  Blood pressure 106/72, pulse 62, resp. rate 18, height '6\' 1"'  (1.854 m), weight 130 lb 6.4 oz (59.149 kg), SpO2 100 %.    HEENT: No thrush or ulcers. Lymphatics: No palpable cervical or supraclavicular lymph nodes. Resp: Faint rales left lung base. No respiratory distress. Cardio: Regular rate and rhythm. GI: Abdomen soft and nontender. No hepatomegaly. Vascular: Pitting edema right lower leg. Neuro: Persistent weakness at the right foot.     Lab Results:  Lab Results  Component Value Date   WBC 10.4* 05/20/2015   HGB 13.8 05/20/2015   HCT 41.6 05/20/2015   MCV 90.0 05/20/2015   PLT 267 05/20/2015   NEUTROABS 5.7 05/20/2015    Imaging:  No results found.  Medications: I have reviewed the patient's current medications.  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 History of thrombocytopenia predating chemotherapy 3. T7 compression fracture-likely pathologic 4. Right foot  drop-likely secondary to nerve compression from the right sacral mass 5. Right foot pain-likely secondary to nerve compression from the sacral mass, improved 6. Diffuse erythematous rash, appearance consistent with a "drug" rash-resolved 7. History of low-grade fever likely secondary to atelectasis/postop fever versus tumor fever 8. Hepatitis B core antibody positive 9. Altered mental status-likely secondary delirium in the setting of infection and hospitalization 10. Admission 04/18/2015 through 04/23/2015 presenting with fever; amylase and lipase elevated.   Abdominal CT showed inflammation in the left upper quadrant and associated loculated fluid collections status post drainage procedure, fluid with high levels of amylase and lipase; cultures negative; question pancreatitis.   04/26/2015 lipase mildly elevated, amylase normal.   CT abdomen/pelvis 05/14/2015 with a persistent 6.5 x 3.8 cm posterior pancreatic tail fluid collection similar in size to the 04/18/2015 CT study. Additional smaller fluid collections in the far lateral left upper quadrant; extensive patchy groundglass opacity at the lung bases, significantly increased; stable expansile lytic lesion in the upper right sacrum. 11. Bilateral lower extremity DVTs on a Doppler 04/18/2015, now on Lovenox. 12. Severe neutropenia 05/13/2015. Likely delayed effect of Rituxan. Improved 05/15/2015. 13. Chest x-ray 05/15/2015 with bilateral mid to lower lung interstitial thickening with some hazy lower lung zone groundglass opacity.   Disposition: [name] performance status is slowly improving but remains inadequate to resume chemotherapy. He will continue to work on nutritional status and activity level. We will try to increase home physical therapy to 3-4 times a week.  He will return for a follow-up visit 05/29/2015. He will contact the office in the interim with any problems. We specifically discussed fever, cough, shortness of  breath.  Patient seen with Dr. Benay Spice.    Ned Card  ANP/GNP-BC   05/20/2015  2:48 PM  This was a shared visit with Ned Card. His performance status continues to improve. I reviewed his chest x-ray with pulmonary medicine. The plan is to observe him unless he develops increased respiratory symptoms.  We encouraged him to increase his physical activity. We will see if he can qualify for more physical therapy.  Julieanne Manson, M.D.

## 2015-05-21 ENCOUNTER — Ambulatory Visit (HOSPITAL_COMMUNITY): Payer: Medicare Other

## 2015-05-21 ENCOUNTER — Telehealth: Payer: Self-pay

## 2015-05-21 DIAGNOSIS — E440 Moderate protein-calorie malnutrition: Secondary | ICD-10-CM | POA: Diagnosis not present

## 2015-05-21 DIAGNOSIS — D72829 Elevated white blood cell count, unspecified: Secondary | ICD-10-CM | POA: Diagnosis not present

## 2015-05-21 DIAGNOSIS — M21371 Foot drop, right foot: Secondary | ICD-10-CM | POA: Diagnosis not present

## 2015-05-21 DIAGNOSIS — G9340 Encephalopathy, unspecified: Secondary | ICD-10-CM | POA: Diagnosis not present

## 2015-05-21 DIAGNOSIS — C8380 Other non-follicular lymphoma, unspecified site: Secondary | ICD-10-CM | POA: Diagnosis not present

## 2015-05-21 DIAGNOSIS — L89302 Pressure ulcer of unspecified buttock, stage 2: Secondary | ICD-10-CM | POA: Diagnosis not present

## 2015-05-21 NOTE — Telephone Encounter (Signed)
-----   Message from Owens Shark, NP sent at 05/20/2015  4:56 PM EST ----- Please contact the home care agency providing physical therapy and request an increase in visits from 2 times a week to 3 or 4 times a week. Thanks

## 2015-05-21 NOTE — Telephone Encounter (Signed)
Call placed to Mr. Woodman spoke with Mrs. Hinely to inquire about the name of the Agency doing physical therapy on Mr. Kopacz. [facility] is the Agency at the time of this conversation Mrs. Elks has relayed to me that they do not want extra therapy, she will get him to be more active. Relayed message to Owens Shark NP

## 2015-05-23 ENCOUNTER — Telehealth: Payer: Self-pay | Admitting: *Deleted

## 2015-05-23 DIAGNOSIS — E440 Moderate protein-calorie malnutrition: Secondary | ICD-10-CM | POA: Diagnosis not present

## 2015-05-23 DIAGNOSIS — C8380 Other non-follicular lymphoma, unspecified site: Secondary | ICD-10-CM | POA: Diagnosis not present

## 2015-05-23 DIAGNOSIS — L89302 Pressure ulcer of unspecified buttock, stage 2: Secondary | ICD-10-CM | POA: Diagnosis not present

## 2015-05-23 DIAGNOSIS — C8580 Other specified types of non-Hodgkin lymphoma, unspecified site: Secondary | ICD-10-CM

## 2015-05-23 DIAGNOSIS — G9340 Encephalopathy, unspecified: Secondary | ICD-10-CM | POA: Diagnosis not present

## 2015-05-23 DIAGNOSIS — M21371 Foot drop, right foot: Secondary | ICD-10-CM | POA: Diagnosis not present

## 2015-05-23 DIAGNOSIS — D72829 Elevated white blood cell count, unspecified: Secondary | ICD-10-CM | POA: Diagnosis not present

## 2015-05-23 NOTE — Telephone Encounter (Signed)
Wife called reporting "Dr. Benay Spice spoke about my husband seeing a pulmonologist.  It's time to see a pulmonologist.  He is short of breath when he gets up to do anything.  Non-productive dry cough."  Will notify Dr. Benay Spice.

## 2015-05-23 NOTE — Telephone Encounter (Signed)
Verbal order received and read back from Dr. Benay Spice to collaborative nurse for patient to have cxr tomorrow and see Dr. Benay Spice at 12:30, no lab.  .  Order/instructions given to patient's wife at this time.  P.O. F. Generated.

## 2015-05-24 ENCOUNTER — Ambulatory Visit (HOSPITAL_COMMUNITY)
Admission: RE | Admit: 2015-05-24 | Discharge: 2015-05-24 | Disposition: A | Discharge status: Home / Self Care | Payer: Medicare Other | Source: Ambulatory Visit | Attending: Oncology | Admitting: Oncology

## 2015-05-24 ENCOUNTER — Ambulatory Visit (HOSPITAL_BASED_OUTPATIENT_CLINIC_OR_DEPARTMENT_OTHER): Payer: Medicare Other | Admitting: Oncology

## 2015-05-24 ENCOUNTER — Other Ambulatory Visit: Payer: Self-pay | Admitting: *Deleted

## 2015-05-24 VITALS — BP 97/71 | HR 108 | Temp 97.4°F | Resp 18 | Ht 73.0 in | Wt 127.0 lb

## 2015-05-24 DIAGNOSIS — M438X4 Other specified deforming dorsopathies, thoracic region: Secondary | ICD-10-CM | POA: Insufficient documentation

## 2015-05-24 DIAGNOSIS — R0600 Dyspnea, unspecified: Secondary | ICD-10-CM | POA: Insufficient documentation

## 2015-05-24 DIAGNOSIS — R0989 Other specified symptoms and signs involving the circulatory and respiratory systems: Secondary | ICD-10-CM | POA: Diagnosis not present

## 2015-05-24 DIAGNOSIS — C8580 Other specified types of non-Hodgkin lymphoma, unspecified site: Secondary | ICD-10-CM

## 2015-05-24 NOTE — Progress Notes (Signed)
[place] OFFICE PROGRESS NOTE   Diagnosis: Non-Hodgkin's lymphoma  INTERVAL HISTORY:   Mr. Freimark returns prior to a scheduled visit. His wife called yesterday with a report of increased dyspnea. He has dyspnea when walking. He has an occasional cough that is sometimes productive of "phlegm". No fever. He is participating in physical therapy. He reports walking across his house 5 times yesterday with physical therapy. He is no longer using a wheelchair. He ambulates with a walker. Table right foot pain. He takes Dilaudid at night. Good appetite.  Objective:  Vital signs in last 24 hours:  Blood pressure 97/71, pulse 108, temperature 97.4 F (36.3 C), temperature source Oral, resp. rate 18, height '6\' 1"'  (1.854 m), weight 127 lb (57.607 kg), SpO2 96 %.   Resp: Fine into inspiratory rales at the left posterior base, no respiratory distress, good air movement bilaterally Cardio: Regular rate and rhythm GI: No hepatomegaly, nontender, no mass Vascular: Trace right low leg/ankle edema Neuro: Decreased strength with dorsi flexion at the right foot, he ambulates to the exam table independently    Lab Results:  Lab Results  Component Value Date   WBC 10.4* 05/20/2015   HGB 13.8 05/20/2015   HCT 41.6 05/20/2015   MCV 90.0 05/20/2015   PLT 267 05/20/2015   NEUTROABS 5.7 05/20/2015      Imaging:  Dg Chest 2 View  05/24/2015  CLINICAL DATA:  Dyspnea, congestion, nonsmoker. History of lymphoma. EXAM: CHEST  2 VIEW COMPARISON:  Chest x-rays dated 05/15/2015 and 02/03/2015. FINDINGS: The interstitial thickening and ground-glass opacities are again noted within the bilateral mid to lower lung zones, slightly improved compared to the most recent chest x-ray 05/15/2015. No new lung abnormality. Lung volumes are stable. No pleural effusions seen. No pneumothorax. Cardiomediastinal silhouette is stable in size and configuration. Degenerative changes are again seen within the  thoracic spine. Compression fracture deformity of a mid thoracic vertebral body is stable compared to most recent chest x-ray of 05/15/2015 but worsened slightly compared to previous chest x-ray of 02/03/2015. IMPRESSION: 1. Slightly improved aeration within the mid and lower lung zones bilaterally. 2. Compression fracture deformity of a mid thoracic vertebral body, stable compared to the most recent chest x-ray of 05/15/2015 but slightly worsened compression compared to chest x-ray of 02/03/2015. Electronically Signed   By: Franki Cabot M.D.   On: 05/24/2015 11:47   Chest x-ray reviewed with Mr. Forst and his wife   Medications: I have reviewed the patient's current medications.  Assessment/Plan: 1. 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 History of thrombocytopenia predating chemotherapy 3. T7 compression fracture-likely pathologic 4. 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 Right foot pain-likely secondary to nerve compression from the sacral mass, improved 6. Diffuse erythematous rash, appearance consistent with a "drug" rash-resolved 7. History of low-grade fever likely secondary to atelectasis/postop fever versus tumor fever 8. Hepatitis B core antibody positive 9. Altered mental status-likely secondary delirium in the setting of infection and hospitalization 10.  Admission 04/18/2015 through  04/23/2015 presenting with fever; amylase and lipase elevated.   Abdominal CT showed inflammation in the left upper quadrant and associated loculated fluid collections status post drainage procedure, fluid with high levels of amylase and lipase; cultures negative; question pancreatitis.   04/26/2015 lipase mildly elevated, amylase normal.   CT abdomen/pelvis 05/14/2015 with a persistent 6.5 x 3.8 cm posterior pancreatic tail fluid collection similar in size to the 04/18/2015 CT study. Additional smaller fluid collections in the far lateral left upper quadrant; extensive patchy groundglass opacity at the lung bases, significantly increased; stable expansile lytic lesion in the upper right sacrum. 11. Bilateral lower extremity DVTs on a Doppler 04/18/2015, now on Lovenox. 12. Severe neutropenia 05/13/2015. Likely delayed effect of Rituxan. Improved 05/15/2015. 13. Chest x-ray 05/15/2015 with bilateral mid to lower lung interstitial thickening with some hazy lower lung zone groundglass opacity. Partially improved on a chest x-ray 05/24/2015    Disposition:  Perry Nielsen has an improved performance status. I suspect the exertional dyspnea is related to deconditioning in conjunction with the inflammatory process in his lungs. The plan is to continue observation unless he develops a consistent cough or increased dyspnea. I reviewed the chest x-ray and discussed the case with pulmonary medicine last week.  He will return for an office visit as scheduled on 05/29/2015. The plan is to resume systemic therapy when his performance status is further improved. He will continue home physical therapy. I encouraged him to increase his diet and ambulation as tolerated.  Betsy Coder, MD  05/24/2015  12:20 PM

## 2015-05-28 DIAGNOSIS — C8380 Other non-follicular lymphoma, unspecified site: Secondary | ICD-10-CM | POA: Diagnosis not present

## 2015-05-28 DIAGNOSIS — G9340 Encephalopathy, unspecified: Secondary | ICD-10-CM | POA: Diagnosis not present

## 2015-05-28 DIAGNOSIS — D72829 Elevated white blood cell count, unspecified: Secondary | ICD-10-CM | POA: Diagnosis not present

## 2015-05-28 DIAGNOSIS — L89302 Pressure ulcer of unspecified buttock, stage 2: Secondary | ICD-10-CM | POA: Diagnosis not present

## 2015-05-28 DIAGNOSIS — E440 Moderate protein-calorie malnutrition: Secondary | ICD-10-CM | POA: Diagnosis not present

## 2015-05-28 DIAGNOSIS — M21371 Foot drop, right foot: Secondary | ICD-10-CM | POA: Diagnosis not present

## 2015-05-29 ENCOUNTER — Encounter (HOSPITAL_COMMUNITY): Payer: Self-pay

## 2015-05-29 ENCOUNTER — Other Ambulatory Visit: Payer: Self-pay

## 2015-05-29 ENCOUNTER — Other Ambulatory Visit (HOSPITAL_BASED_OUTPATIENT_CLINIC_OR_DEPARTMENT_OTHER): Payer: Medicare Other

## 2015-05-29 ENCOUNTER — Telehealth: Payer: Self-pay | Admitting: *Deleted

## 2015-05-29 ENCOUNTER — Emergency Department (HOSPITAL_COMMUNITY): Payer: Medicare Other

## 2015-05-29 ENCOUNTER — Emergency Department (HOSPITAL_COMMUNITY)
Admission: EM | Admit: 2015-05-29 | Discharge: 2015-05-29 | Disposition: A | Discharge status: Home / Self Care | Payer: Medicare Other | Attending: Emergency Medicine | Admitting: Emergency Medicine

## 2015-05-29 ENCOUNTER — Ambulatory Visit (HOSPITAL_BASED_OUTPATIENT_CLINIC_OR_DEPARTMENT_OTHER): Payer: Medicare Other | Admitting: Oncology

## 2015-05-29 VITALS — BP 96/60 | HR 79 | Temp 97.5°F | Resp 20 | Ht 73.0 in | Wt 129.3 lb

## 2015-05-29 DIAGNOSIS — I82403 Acute embolism and thrombosis of unspecified deep veins of lower extremity, bilateral: Secondary | ICD-10-CM

## 2015-05-29 DIAGNOSIS — C8580 Other specified types of non-Hodgkin lymphoma, unspecified site: Secondary | ICD-10-CM | POA: Diagnosis not present

## 2015-05-29 DIAGNOSIS — Z7901 Long term (current) use of anticoagulants: Secondary | ICD-10-CM | POA: Diagnosis not present

## 2015-05-29 DIAGNOSIS — I4891 Unspecified atrial fibrillation: Secondary | ICD-10-CM

## 2015-05-29 DIAGNOSIS — K219 Gastro-esophageal reflux disease without esophagitis: Secondary | ICD-10-CM | POA: Insufficient documentation

## 2015-05-29 DIAGNOSIS — Z85828 Personal history of other malignant neoplasm of skin: Secondary | ICD-10-CM | POA: Insufficient documentation

## 2015-05-29 DIAGNOSIS — I499 Cardiac arrhythmia, unspecified: Secondary | ICD-10-CM | POA: Diagnosis present

## 2015-05-29 DIAGNOSIS — R0600 Dyspnea, unspecified: Secondary | ICD-10-CM | POA: Diagnosis not present

## 2015-05-29 DIAGNOSIS — C8339 Diffuse large B-cell lymphoma, extranodal and solid organ sites: Secondary | ICD-10-CM | POA: Diagnosis not present

## 2015-05-29 DIAGNOSIS — Z8579 Personal history of other malignant neoplasms of lymphoid, hematopoietic and related tissues: Secondary | ICD-10-CM | POA: Insufficient documentation

## 2015-05-29 DIAGNOSIS — I480 Paroxysmal atrial fibrillation: Secondary | ICD-10-CM | POA: Diagnosis not present

## 2015-05-29 DIAGNOSIS — R0602 Shortness of breath: Secondary | ICD-10-CM | POA: Diagnosis not present

## 2015-05-29 DIAGNOSIS — Z87438 Personal history of other diseases of male genital organs: Secondary | ICD-10-CM | POA: Diagnosis not present

## 2015-05-29 DIAGNOSIS — R531 Weakness: Secondary | ICD-10-CM | POA: Diagnosis not present

## 2015-05-29 DIAGNOSIS — Z79899 Other long term (current) drug therapy: Secondary | ICD-10-CM | POA: Diagnosis not present

## 2015-05-29 DIAGNOSIS — G8929 Other chronic pain: Secondary | ICD-10-CM | POA: Diagnosis not present

## 2015-05-29 LAB — I-STAT TROPONIN, ED: Troponin i, poc: 0.07 ng/mL (ref 0.00–0.08)

## 2015-05-29 LAB — COMPREHENSIVE METABOLIC PANEL (CC13)
ALK PHOS: 54 U/L (ref 40–150)
ALT: 13 U/L (ref 0–55)
ANION GAP: 10 meq/L (ref 3–11)
AST: 15 U/L (ref 5–34)
Albumin: 2.5 g/dL — ABNORMAL LOW (ref 3.5–5.0)
BILIRUBIN TOTAL: 0.45 mg/dL (ref 0.20–1.20)
BUN: 14.6 mg/dL (ref 7.0–26.0)
CO2: 22 meq/L (ref 22–29)
Calcium: 9.6 mg/dL (ref 8.4–10.4)
Chloride: 107 mEq/L (ref 98–109)
Creatinine: 0.7 mg/dL (ref 0.7–1.3)
GLUCOSE: 105 mg/dL (ref 70–140)
POTASSIUM: 3.8 meq/L (ref 3.5–5.1)
SODIUM: 140 meq/L (ref 136–145)
TOTAL PROTEIN: 6 g/dL — AB (ref 6.4–8.3)

## 2015-05-29 LAB — CBC WITH DIFFERENTIAL/PLATELET
BASO%: 0.5 % (ref 0.0–2.0)
BASOS ABS: 0 10*3/uL (ref 0.0–0.1)
EOS ABS: 0.2 10*3/uL (ref 0.0–0.5)
EOS%: 2.4 % (ref 0.0–7.0)
HCT: 39 % (ref 38.4–49.9)
HGB: 12.7 g/dL — ABNORMAL LOW (ref 13.0–17.1)
LYMPH%: 28 % (ref 14.0–49.0)
MCH: 29.6 pg (ref 27.2–33.4)
MCHC: 32.6 g/dL (ref 32.0–36.0)
MCV: 90.9 fL (ref 79.3–98.0)
MONO#: 1.3 10*3/uL — AB (ref 0.1–0.9)
MONO%: 14.7 % — AB (ref 0.0–14.0)
NEUT%: 54.4 % (ref 39.0–75.0)
NEUTROS ABS: 4.8 10*3/uL (ref 1.5–6.5)
PLATELETS: 343 10*3/uL (ref 140–400)
RBC: 4.29 10*6/uL (ref 4.20–5.82)
RDW: 16.7 % — ABNORMAL HIGH (ref 11.0–14.6)
WBC: 8.7 10*3/uL (ref 4.0–10.3)
lymph#: 2.4 10*3/uL (ref 0.9–3.3)

## 2015-05-29 LAB — LACTATE DEHYDROGENASE (CC13): LDH: 180 U/L (ref 125–245)

## 2015-05-29 LAB — AMYLASE: Amylase: 127 U/L — ABNORMAL HIGH (ref 0–105)

## 2015-05-29 LAB — LIPASE: Lipase: 79 U/L — ABNORMAL HIGH (ref 7–60)

## 2015-05-29 MED ORDER — SODIUM CHLORIDE 0.9 % IV BOLUS (SEPSIS)
1000.0000 mL | Freq: Once | INTRAVENOUS | Status: AC
  Administered 2015-05-29: 1000 mL via INTRAVENOUS

## 2015-05-29 NOTE — ED Notes (Addendum)
Patient c/o SOB since 0700 this morning.  States that was more difficult for him to get out of bed this morning then normal, wife reports an unsteady gate, denies fall.  Patient reports taking Lovenox injections daily for blood clots in bilateral legs.  Patient states that SOB is relieved when lying down.  Patient denies chest pain.

## 2015-05-29 NOTE — Discharge Instructions (Signed)
Follow up with your md next week.  Return if not improving

## 2015-05-29 NOTE — ED Notes (Signed)
Bed: [id number] Expected date:  Expected time:  Means of arrival:  Comments: [place] Pt- new onset A Fib

## 2015-05-29 NOTE — ED Provider Notes (Addendum)
CSN: [id number]     Arrival date & time 05/29/15  1232 History   First MD Initiated Contact with Patient 05/29/15 1244     Chief Complaint  Patient presents with  . Irregular Heart Beat     (Consider location/radiation/quality/duration/timing/severity/associated sxs/prior Treatment) Patient is a 74 y.o. male presenting with weakness. The history is provided by a caregiver (The patient was sent over here from the oncology office because he was in atrial fib. He stated that he felt like his heart was beating fast today. He just had a regular appointment with his oncologist.).  Weakness This is a new problem. The current episode started 6 to 12 hours ago. The problem occurs rarely. The problem has not changed since onset.Pertinent negatives include no chest pain, no abdominal pain and no headaches. Nothing aggravates the symptoms. Nothing relieves the symptoms.    Past Medical History  Diagnosis Date  . 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 ALLERGIC RHINITIS 08/03/2007  . GERD 02/18/2007  . KNEE PAIN, LEFT, CHRONIC 08/07/2008  . BACK PAIN 09/18/2009  . HAMMER TOE 08/03/2007  . CAROTID BRUIT, LEFT 08/07/2008  . 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 Foot drop     rt foot  . Rash, skin     torso  . Hepatitis 1977    no residual problems  . Sleeping difficulty   . Skin cancer   . Lymphoma [facility])    Past Surgical History  Procedure Laterality Date  . Shoulder open rotator cuff repair  07/06/05    right  . Knee arthroscopy      left  . Tonsillectomy and adenoidectomy    . Transurethral resection of prostate    . Cataract extraction w/ intraocular lens  implant, bilateral    . 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 Heart attack Other   . Aneurysm  Other     aortic  . Coronary artery disease Sister   . Cancer Father     prostate  . Pulmonary fibrosis Mother   . Uterine cancer Mother    Social History  Substance Use Topics  . Smoking status: Never Smoker   . Smokeless tobacco: None  . Alcohol Use: None     Comment: 2-3 times a week. Beer.     Review of Systems  Constitutional: Negative for appetite change and fatigue.  HENT: Negative for congestion, ear discharge and sinus pressure.   Eyes: Negative for discharge.  Respiratory: Negative for cough.   Cardiovascular: Negative for chest pain.  Gastrointestinal: Negative for abdominal pain and diarrhea.  Genitourinary: Negative for frequency and hematuria.  Musculoskeletal: Negative for back pain.  Skin: Negative for rash.  Neurological: Positive for weakness. Negative for seizures and headaches.  Psychiatric/Behavioral: Negative for hallucinations.      Allergies  Antihistamines, diphenhydramine-type  Home Medications   Prior to Admission medications   Medication Sig Start Date End Date Taking? Authorizing Provider  enoxaparin (LOVENOX) 100 MG/ML injection Inject 1 mL (100 mg total) into the skin daily. 04/23/15  Yes Kelvin Cellar, MD  famotidine (PEPCID) 20 MG tablet Take 20 mg by mouth daily.    Yes Historical Provider, MD  feeding supplement, ENSURE ENLIVE, (ENSURE ENLIVE) LIQD Take 237 mLs by mouth 2 (  two) times daily between meals. Patient taking differently: Take 237 mLs by mouth 2 (two) times daily between meals. Ensure Clear 03/27/15  Yes Annita Brod, MD  fluticasone (FLONASE) 50 MCG/ACT nasal spray Place 1 spray into both nostrils daily as needed for allergies.    Yes Historical Provider, MD  HYDROmorphone (DILAUDID) 2 MG tablet Take 1 tablet (2 mg total) by mouth every 4 (four) hours as needed for severe pain. Patient taking differently: Take 1 mg by mouth at bedtime as needed for severe pain.  05/15/15  Yes Owens Shark, NP  ibuprofen (ADVIL,MOTRIN)  200 MG tablet Take 400 mg by mouth every 4 (four) hours as needed for fever, headache, mild pain, moderate pain or cramping.   Yes Historical Provider, MD  loperamide (IMODIUM A-D) 2 MG tablet Take 2 mg by mouth 4 (four) times daily as needed for diarrhea or loose stools.   Yes Historical Provider, MD  loratadine (CLARITIN) 10 MG tablet Take 10 mg by mouth daily.   Yes Historical Provider, MD  LORazepam (ATIVAN) 0.5 MG tablet Take 1 tablet (0.5 mg total) by mouth 2 (two) times daily as needed for anxiety. 04/16/15  Yes Dorena Cookey, MD  megestrol (MEGACE ORAL) 40 MG/ML suspension Take 5 mLs (200 mg total) by mouth daily. 04/26/15  Yes Owens Shark, NP  oxymetazoline (AFRIN) 0.05 % nasal spray Place 1 spray into both nostrils 2 (two) times daily as needed for congestion.   Yes Historical Provider, MD  Polyethyl Glycol-Propyl Glycol (SYSTANE OP) Apply 1 drop to eye daily as needed (dryness).   Yes Historical Provider, MD  Probiotic Product (PROBIOTIC PO) Take 1 capsule by mouth daily.   Yes Historical Provider, MD  prochlorperazine (COMPAZINE) 5 MG tablet Take 1-2 tablets (5-10 mg total) by mouth every 6 (six) hours as needed for nausea or vomiting. 03/14/15  Yes Owens Shark, NP  shark liver oil-cocoa butter (PREPARATION H) 0.25-3-85.5 % suppository Place 1 suppository rectally as needed for hemorrhoids.   Yes Historical Provider, MD  potassium chloride SA (K-DUR,KLOR-CON) 20 MEQ tablet Take 1 tablet (20 mEq total) by mouth 2 (two) times daily. 03/29/15   Ladell Pier, MD  sulfamethoxazole-trimethoprim (BACTRIM DS,SEPTRA DS) 800-160 MG tablet Take 1 tablet by mouth 2 (two) times daily. Patient not taking: Reported on 05/29/2015 05/15/15   Owens Shark, NP  traZODone (DESYREL) 100 MG tablet Take 1 tablet (100 mg total) by mouth at bedtime. Patient taking differently: Take 100 mg by mouth at bedtime as needed.  03/27/15   Annita Brod, MD   BP 95/71 mmHg  Pulse 90  Temp(Src) 97.6 F (36.4 C)  (Oral)  Resp 19  SpO2 98% Physical Exam  Constitutional: He is oriented to person, place, and time. He appears well-developed.  HENT:  Head: Normocephalic.  Eyes: Conjunctivae and EOM are normal. No scleral icterus.  Neck: Neck supple. No thyromegaly present.  Cardiovascular: Normal rate and regular rhythm.  Exam reveals no gallop and no friction rub.   No murmur heard. Pulmonary/Chest: No stridor. He has no wheezes. He has no rales. He exhibits no tenderness.  Abdominal: He exhibits no distension. There is no tenderness. There is no rebound.  Musculoskeletal: Normal range of motion. He exhibits no edema.  Lymphadenopathy:    He has no cervical adenopathy.  Neurological: He is oriented to person, place, and time. He exhibits normal muscle tone. Coordination normal.  Skin: No rash noted. No erythema.  Psychiatric: He  has a normal mood and affect. His behavior is normal.    ED Course  Procedures (including critical care time) [place], ED    Imaging Review Dg Chest Port 1 View  05/29/2015  CLINICAL DATA:  Shortness of breath for 1 day.  History of lymphoma. EXAM: PORTABLE CHEST 1 VIEW COMPARISON:  May 24, 2015 FINDINGS: Patchy interstitial opacity throughout the mid and lower lung zones remains as well as patchy bibasilar airspace opacity, not appreciably changed. No new opacity. Heart size and pulmonary vascular normal. No adenopathy. There is degenerative change in the thoracic spine IMPRESSION: Interstitial prominence and patchy bibasilar airspace opacity remain without change. No new opacity appreciable. No change in cardiac silhouette. Electronically Signed   By: Lowella Grip III M.D.   On: 05/29/2015 14:12   I have personally reviewed and evaluated these images and lab results as part of my medical decision-making.   EKG Interpretation None      MDM   Final diagnoses:  Atrial fibrillation with rapid ventricular response [facility])     Patient was in atrial fib when he was sent over from the oncology office. He converted himself into normal sinus. Patient stated that he felt fine and wanted to go home. His blood pressure was 95/75. I called his oncologist. His physician stated that his blood pressure usually runs low and it would be fine for the patient to be discharged home and he would follow this week    Milton Ferguson, MD 05/29/15 1508  Milton Ferguson, MD 06/12/15 [phone]

## 2015-05-29 NOTE — Telephone Encounter (Signed)
[name] wife stopped me in the lobby after he was discharged from ED. Asked if Dr. Benay Spice would switch anticoagulant to an oral agent. She did not want to refill Lovenox injection. MD out of the office this afternoon. Instructed her to continue Lovenox until next visit. She voiced understanding and her frustration with this.  Will review with MD when back in office.

## 2015-05-29 NOTE — Progress Notes (Signed)
[place] OFFICE PROGRESS NOTE   Diagnosis: Non-Hodgkin's lymphoma  INTERVAL HISTORY:   He returns for scheduled visit. He has a good appetite. Perry Nielsen reports being active with physical therapy this week. Stable right foot pain. He noted the onset of dyspnea this morning. Mild cough, no fever.  Objective:  Vital signs in last 24 hours:  Blood pressure 96/60, pulse 79, temperature 97.5 F (36.4 C), temperature source Oral, resp. rate 20, height _0  (1.854 m), weight 129 lb 4.8 oz (58.65 kg), SpO2 97 %.   Resp: Lungs clear bilaterally Cardio: Tachycardia, irregular GI: No hepatosplenomegaly, nontender Vascular: Trace pitting edema at the right greater than left ankle Neuro: Alert and oriented, he has some movement with dorsi flexion at the right foot      Lab Results:  Lab Results  Component Value Date   WBC 8.7 05/29/2015   HGB 12.7* 05/29/2015   HCT 39.0 05/29/2015   MCV 90.9 05/29/2015   PLT 343 05/29/2015   NEUTROABS 4.8 05/29/2015   potassium 3.8, creatinine 0.7, albumin 2.5, LDH 180   Medications: I have reviewed the patient's current medications.  Assessment/Plan: 1. 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 History of thrombocytopenia predating chemotherapy 3. T7 compression fracture-likely pathologic 4. Right foot drop-likely secondary to nerve compression  from the right sacral mass 5. Right foot pain-likely secondary to nerve compression from the sacral mass, improved 6. Diffuse erythematous rash, appearance consistent with a "drug" rash-resolved 7. History of low-grade fever likely secondary to atelectasis/postop fever versus tumor fever 8. Hepatitis B core antibody positive 9. Altered mental status-likely secondary delirium in the setting of infection and hospitalization 10. Admission 04/18/2015 through 04/23/2015 presenting with fever; amylase and lipase elevated.   Abdominal CT showed inflammation in the left upper quadrant and associated loculated fluid collections status post drainage procedure, fluid with high levels of amylase and lipase; cultures negative; question pancreatitis.   04/26/2015 lipase mildly elevated, amylase normal.   CT abdomen/pelvis 05/14/2015 with a persistent 6.5 x 3.8 cm posterior pancreatic tail fluid collection similar in size to the 04/18/2015 CT study. Additional smaller fluid collections in the far lateral left upper quadrant; extensive patchy groundglass opacity at the lung bases, significantly increased; stable expansile lytic lesion in the upper right sacrum. 11. Bilateral lower extremity DVTs on a Doppler 04/18/2015, now on Lovenox. 12. Severe neutropenia 05/13/2015. Likely delayed effect of Rituxan. Resolved 13. Chest x-ray 05/15/2015 with bilateral mid to lower lung interstitial thickening with some hazy lower lung zone groundglass opacity. Partially improved on the chest x-ray 05/24/2015 14. New-onset rapid atrial fibrillation 05/29/2015   Disposition:  Perry Nielsen has increased dyspnea today. An EKG is consistent with atrial fibrillation with a rapid ventricular response. I suspect the atrial fibrillation is causing the dyspnea. He is in clinical remission from non-Hodgkin's lymphoma after receiving 2 cycles of systemic chemotherapy, last on 04/05/2015. He was noted to have inflammatory changes at  the lung bases on CT last month and chest x-rays earlier this month. This could be responsible for  the atrial fibrillation, but he does not have symptoms to suggest a progressive lung process.  I referred him to the emergency room for evaluation/management of atrial fibrillation.   Recommendations: 1. Emergency room/cardiology evaluation of a fibrillation 2. Consider chest imaging to follow-up on the inflammatory lung process if he has continued dyspnea/cough after control of the tachycardia 3. Please call Oncology as needed during this hospital admission. I will schedule outpatient follow-up.  Betsy Coder, MD  05/29/2015  12:34 PM

## 2015-05-31 ENCOUNTER — Telehealth: Payer: Self-pay | Admitting: *Deleted

## 2015-05-31 DIAGNOSIS — M21371 Foot drop, right foot: Secondary | ICD-10-CM | POA: Diagnosis not present

## 2015-05-31 DIAGNOSIS — D72829 Elevated white blood cell count, unspecified: Secondary | ICD-10-CM | POA: Diagnosis not present

## 2015-05-31 DIAGNOSIS — G9340 Encephalopathy, unspecified: Secondary | ICD-10-CM | POA: Diagnosis not present

## 2015-05-31 DIAGNOSIS — L89302 Pressure ulcer of unspecified buttock, stage 2: Secondary | ICD-10-CM | POA: Diagnosis not present

## 2015-05-31 DIAGNOSIS — E440 Moderate protein-calorie malnutrition: Secondary | ICD-10-CM | POA: Diagnosis not present

## 2015-05-31 DIAGNOSIS — C8380 Other non-follicular lymphoma, unspecified site: Secondary | ICD-10-CM | POA: Diagnosis not present

## 2015-05-31 MED ORDER — RIVAROXABAN 20 MG PO TABS: 20.0000 mg | ORAL_TABLET | Freq: Every day | ORAL | Status: DC

## 2015-05-31 NOTE — Telephone Encounter (Signed)
Per Dr. Benay Spice: OK to switch to Xarelto 20 mg daily. Discontinue Lovenox. Called wife with instructions, she voiced understanding. Rx sent electronically.

## 2015-06-03 DIAGNOSIS — I8291 Chronic embolism and thrombosis of unspecified vein: Secondary | ICD-10-CM | POA: Diagnosis not present

## 2015-06-03 DIAGNOSIS — T814XXA Infection following a procedure, initial encounter: Secondary | ICD-10-CM | POA: Diagnosis not present

## 2015-06-03 DIAGNOSIS — D72829 Elevated white blood cell count, unspecified: Secondary | ICD-10-CM | POA: Diagnosis not present

## 2015-06-03 DIAGNOSIS — M21371 Foot drop, right foot: Secondary | ICD-10-CM | POA: Diagnosis not present

## 2015-06-03 DIAGNOSIS — A419 Sepsis, unspecified organism: Secondary | ICD-10-CM | POA: Diagnosis not present

## 2015-06-03 DIAGNOSIS — L89302 Pressure ulcer of unspecified buttock, stage 2: Secondary | ICD-10-CM | POA: Diagnosis not present

## 2015-06-03 DIAGNOSIS — E440 Moderate protein-calorie malnutrition: Secondary | ICD-10-CM | POA: Diagnosis not present

## 2015-06-03 DIAGNOSIS — C8380 Other non-follicular lymphoma, unspecified site: Secondary | ICD-10-CM | POA: Diagnosis not present

## 2015-06-03 DIAGNOSIS — G9340 Encephalopathy, unspecified: Secondary | ICD-10-CM | POA: Diagnosis not present

## 2015-06-04 ENCOUNTER — Telehealth: Payer: Self-pay

## 2015-06-04 ENCOUNTER — Telehealth: Payer: Self-pay | Admitting: Oncology

## 2015-06-04 NOTE — Telephone Encounter (Signed)
Wife left message wanted to know when next f/u will be. No additional info/pof sent for patient after 11/23 visit - message to desk nurse re f/u. Left message for wife that she will be contacted re f/u.

## 2015-06-04 NOTE — Telephone Encounter (Signed)
Wife called asking when his f/u appt is. Dr Benay Spice sent pt to ER with a-fib with RVR on 11/23 and did not send POF to scheduler.

## 2015-06-04 NOTE — Telephone Encounter (Signed)
Discussed with Amy, should be scheduling for 12/6

## 2015-06-05 ENCOUNTER — Telehealth: Payer: Self-pay | Admitting: Oncology

## 2015-06-05 DIAGNOSIS — C8380 Other non-follicular lymphoma, unspecified site: Secondary | ICD-10-CM | POA: Diagnosis not present

## 2015-06-05 DIAGNOSIS — G9340 Encephalopathy, unspecified: Secondary | ICD-10-CM | POA: Diagnosis not present

## 2015-06-05 DIAGNOSIS — E440 Moderate protein-calorie malnutrition: Secondary | ICD-10-CM | POA: Diagnosis not present

## 2015-06-05 DIAGNOSIS — I8291 Chronic embolism and thrombosis of unspecified vein: Secondary | ICD-10-CM | POA: Diagnosis not present

## 2015-06-05 DIAGNOSIS — D72829 Elevated white blood cell count, unspecified: Secondary | ICD-10-CM | POA: Diagnosis not present

## 2015-06-05 DIAGNOSIS — M21371 Foot drop, right foot: Secondary | ICD-10-CM | POA: Diagnosis not present

## 2015-06-05 DIAGNOSIS — A419 Sepsis, unspecified organism: Secondary | ICD-10-CM | POA: Diagnosis not present

## 2015-06-05 DIAGNOSIS — L89302 Pressure ulcer of unspecified buttock, stage 2: Secondary | ICD-10-CM | POA: Diagnosis not present

## 2015-06-05 DIAGNOSIS — T814XXA Infection following a procedure, initial encounter: Secondary | ICD-10-CM | POA: Diagnosis not present

## 2015-06-05 NOTE — Telephone Encounter (Signed)
LEFT MESSAGE FOR PATIENT RE APPOINTMENT FOR 12/6.

## 2015-06-05 NOTE — Telephone Encounter (Signed)
Per Dr. Benay Spice; notified pt's wife that schedulers will be calling with appt on 12/6 with Mikey Bussing, NP and Dr. Benay Spice will also see pt.  [name] wife verbalized understanding and expressed appreciation for call.

## 2015-06-07 DIAGNOSIS — A419 Sepsis, unspecified organism: Secondary | ICD-10-CM | POA: Diagnosis not present

## 2015-06-07 DIAGNOSIS — G9340 Encephalopathy, unspecified: Secondary | ICD-10-CM | POA: Diagnosis not present

## 2015-06-07 DIAGNOSIS — D72829 Elevated white blood cell count, unspecified: Secondary | ICD-10-CM | POA: Diagnosis not present

## 2015-06-07 DIAGNOSIS — C8380 Other non-follicular lymphoma, unspecified site: Secondary | ICD-10-CM | POA: Diagnosis not present

## 2015-06-07 DIAGNOSIS — E440 Moderate protein-calorie malnutrition: Secondary | ICD-10-CM | POA: Diagnosis not present

## 2015-06-07 DIAGNOSIS — T814XXA Infection following a procedure, initial encounter: Secondary | ICD-10-CM | POA: Diagnosis not present

## 2015-06-07 DIAGNOSIS — I8291 Chronic embolism and thrombosis of unspecified vein: Secondary | ICD-10-CM | POA: Diagnosis not present

## 2015-06-07 DIAGNOSIS — L89302 Pressure ulcer of unspecified buttock, stage 2: Secondary | ICD-10-CM | POA: Diagnosis not present

## 2015-06-07 DIAGNOSIS — M21371 Foot drop, right foot: Secondary | ICD-10-CM | POA: Diagnosis not present

## 2015-06-10 ENCOUNTER — Other Ambulatory Visit: Payer: Self-pay | Admitting: *Deleted

## 2015-06-10 ENCOUNTER — Telehealth: Payer: Self-pay | Admitting: Oncology

## 2015-06-10 DIAGNOSIS — L89302 Pressure ulcer of unspecified buttock, stage 2: Secondary | ICD-10-CM | POA: Diagnosis not present

## 2015-06-10 DIAGNOSIS — M21371 Foot drop, right foot: Secondary | ICD-10-CM | POA: Diagnosis not present

## 2015-06-10 DIAGNOSIS — D72829 Elevated white blood cell count, unspecified: Secondary | ICD-10-CM | POA: Diagnosis not present

## 2015-06-10 DIAGNOSIS — I8291 Chronic embolism and thrombosis of unspecified vein: Secondary | ICD-10-CM | POA: Diagnosis not present

## 2015-06-10 DIAGNOSIS — G9340 Encephalopathy, unspecified: Secondary | ICD-10-CM | POA: Diagnosis not present

## 2015-06-10 DIAGNOSIS — T814XXA Infection following a procedure, initial encounter: Secondary | ICD-10-CM | POA: Diagnosis not present

## 2015-06-10 DIAGNOSIS — A419 Sepsis, unspecified organism: Secondary | ICD-10-CM | POA: Diagnosis not present

## 2015-06-10 DIAGNOSIS — C8380 Other non-follicular lymphoma, unspecified site: Secondary | ICD-10-CM | POA: Diagnosis not present

## 2015-06-10 DIAGNOSIS — E440 Moderate protein-calorie malnutrition: Secondary | ICD-10-CM | POA: Diagnosis not present

## 2015-06-10 NOTE — Telephone Encounter (Signed)
Patient aware of cancelled lab appointment

## 2015-06-11 ENCOUNTER — Encounter: Payer: Self-pay | Admitting: Oncology

## 2015-06-11 ENCOUNTER — Telehealth: Payer: Self-pay | Admitting: Oncology

## 2015-06-11 ENCOUNTER — Ambulatory Visit (HOSPITAL_BASED_OUTPATIENT_CLINIC_OR_DEPARTMENT_OTHER): Payer: Medicare Other | Admitting: Oncology

## 2015-06-11 ENCOUNTER — Telehealth: Payer: Self-pay | Admitting: *Deleted

## 2015-06-11 ENCOUNTER — Other Ambulatory Visit: Payer: Self-pay

## 2015-06-11 ENCOUNTER — Ambulatory Visit (HOSPITAL_COMMUNITY)
Admission: RE | Admit: 2015-06-11 | Discharge: 2015-06-11 | Disposition: A | Discharge status: Home / Self Care | Payer: Medicare Other | Source: Ambulatory Visit | Attending: Oncology | Admitting: Oncology

## 2015-06-11 VITALS — BP 99/60 | HR 91 | Temp 96.8°F | Resp 17 | Ht 73.0 in | Wt 134.0 lb

## 2015-06-11 DIAGNOSIS — C8330 Diffuse large B-cell lymphoma, unspecified site: Secondary | ICD-10-CM | POA: Insufficient documentation

## 2015-06-11 DIAGNOSIS — I4891 Unspecified atrial fibrillation: Secondary | ICD-10-CM | POA: Insufficient documentation

## 2015-06-11 DIAGNOSIS — R0602 Shortness of breath: Secondary | ICD-10-CM | POA: Diagnosis not present

## 2015-06-11 DIAGNOSIS — I82403 Acute embolism and thrombosis of unspecified deep veins of lower extremity, bilateral: Secondary | ICD-10-CM | POA: Diagnosis not present

## 2015-06-11 DIAGNOSIS — R059 Cough, unspecified: Secondary | ICD-10-CM

## 2015-06-11 DIAGNOSIS — I517 Cardiomegaly: Secondary | ICD-10-CM | POA: Diagnosis not present

## 2015-06-11 DIAGNOSIS — M79671 Pain in right foot: Secondary | ICD-10-CM

## 2015-06-11 DIAGNOSIS — R05 Cough: Secondary | ICD-10-CM | POA: Insufficient documentation

## 2015-06-11 DIAGNOSIS — C8580 Other specified types of non-Hodgkin lymphoma, unspecified site: Secondary | ICD-10-CM | POA: Diagnosis not present

## 2015-06-11 MED ORDER — RIVAROXABAN 20 MG PO TABS: 20.0000 mg | ORAL_TABLET | Freq: Every day | ORAL | Status: DC

## 2015-06-11 NOTE — Telephone Encounter (Signed)
Per staff phone call and POF I have schedueld appts. Scheduler advised of appts.  JMW  

## 2015-06-11 NOTE — Telephone Encounter (Signed)
per pof to sch pt appt-per MAW she will send Dr Benay Spice an email to see about the inj for after the 12/30 appt-gave pt copy o favs-and adv we will let them know after next appt

## 2015-06-11 NOTE — Progress Notes (Signed)
[place] OFFICE PROGRESS NOTE   Diagnosis: Non-Hodgkin's lymphoma  INTERVAL HISTORY:   He returns for scheduled visit. At his last visit he was noted to have new onset atrial fibrillation with rapid ventricular response. He was sent to the ER and converted to normal sinus rhythm on his own. He indicates that he has not had any recurrent palpitations. His shortness of breath has improved. He has a good appetite and is gaining weight. Perry Nielsen reports being active with physical therapy this week. Physical therapy is coming to his home 3 times per week. Stable right foot pain.  Objective:  Vital signs in last 24 hours:  Blood pressure 99/60, pulse 91, temperature 96.8 F (36 C), temperature source Oral, resp. rate 17, height _0  (1.854 m), weight 134 lb (60.782 kg), SpO2 99 %.   Resp: Lungs clear bilaterally Cardio: Regular rate and rhythm. GI: No hepatosplenomegaly, nontender Vascular: Trace pitting edema at the right greater than left ankle Neuro: Alert and oriented, he has some movement with dorsi flexion at the right foot      Lab Results:  Lab Results  Component Value Date   WBC 8.7 05/29/2015   HGB 12.7* 05/29/2015   HCT 39.0 05/29/2015   MCV 90.9 05/29/2015   PLT 343 05/29/2015   NEUTROABS 4.8 05/29/2015     Medications: I have reviewed the patient's current medications.  Assessment/Plan: 1. 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 History of thrombocytopenia predating chemotherapy 3. T7 compression fracture-likely pathologic 4. 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 Right foot pain-likely secondary to nerve compression from the sacral mass, improved 6. Diffuse erythematous rash, appearance consistent with a "drug" rash-resolved 7. History of low-grade fever likely secondary to atelectasis/postop fever versus tumor fever 8. Hepatitis B core antibody positive 9. Altered mental status-likely secondary delirium in the setting of infection and hospitalization 10. Admission 04/18/2015 through 04/23/2015 presenting with fever; amylase and lipase elevated.   Abdominal CT showed inflammation in the left upper quadrant and associated loculated fluid collections status post drainage procedure, fluid with high levels of amylase and lipase; cultures negative; question pancreatitis.   04/26/2015 lipase mildly elevated, amylase normal.   CT abdomen/pelvis 05/14/2015 with a persistent 6.5 x 3.8 cm posterior pancreatic tail fluid collection similar in size to the 04/18/2015 CT study. Additional smaller fluid collections in the far lateral left upper quadrant; extensive patchy groundglass opacity at the lung bases, significantly increased; stable expansile lytic lesion in the upper right sacrum. 11. Bilateral lower extremity DVTs on a Doppler 04/18/2015, now on Xarelto 12. Severe neutropenia 05/13/2015. Likely delayed effect of Rituxan. Resolved 13. Chest x-ray 05/15/2015 with bilateral mid to lower lung interstitial thickening with some hazy lower lung zone groundglass opacity. Partially improved on the chest x-ray 05/24/2015 14. New-onset rapid atrial fibrillation 05/29/2015. Has converted to normal sinus rhythm.   Disposition:  Perry Nielsen is doing well today. His appetite is improving and he is gaining weight.  The  patient was seen and examined  with Dr. Benay Spice. Recommend that he resume his chemotherapy with CHOP-Rituxan. Plan to begin his third cycle on December 9. He will receive Neulasta with his chemotherapy. I have scheduled him for a nadir check around December 19. He will have a follow-up visit on December 30 for his fourth cycle of chemotherapy.  He was noted to have inflammatory changes at the lung bases on CT last month and chest x-rays earlier this month. He no longer has a cough or dyspnea, but we will go ahead and get a chest x-ray today for follow-up.  The patient is aware to call for any questions or problems prior to his next visit.  Perry Bussing, DNP, AGPCNP-BC, AOCNP  06/11/2015  11:09 AM   This was a shared visit with Perry Nielsen. Perry Nielsen was interviewed and examined. His performance status has improved. He has a good appetite and is progressing with physical therapy. The plan is to resume CHOP-rituximab on 06/14/2015.  Perry Nielsen, M.D.

## 2015-06-12 DIAGNOSIS — C8380 Other non-follicular lymphoma, unspecified site: Secondary | ICD-10-CM | POA: Diagnosis not present

## 2015-06-12 DIAGNOSIS — E440 Moderate protein-calorie malnutrition: Secondary | ICD-10-CM | POA: Diagnosis not present

## 2015-06-12 DIAGNOSIS — D72829 Elevated white blood cell count, unspecified: Secondary | ICD-10-CM | POA: Diagnosis not present

## 2015-06-12 DIAGNOSIS — M21371 Foot drop, right foot: Secondary | ICD-10-CM | POA: Diagnosis not present

## 2015-06-12 DIAGNOSIS — A419 Sepsis, unspecified organism: Secondary | ICD-10-CM | POA: Diagnosis not present

## 2015-06-12 DIAGNOSIS — L89302 Pressure ulcer of unspecified buttock, stage 2: Secondary | ICD-10-CM | POA: Diagnosis not present

## 2015-06-12 DIAGNOSIS — I8291 Chronic embolism and thrombosis of unspecified vein: Secondary | ICD-10-CM | POA: Diagnosis not present

## 2015-06-12 DIAGNOSIS — G9340 Encephalopathy, unspecified: Secondary | ICD-10-CM | POA: Diagnosis not present

## 2015-06-12 DIAGNOSIS — T814XXA Infection following a procedure, initial encounter: Secondary | ICD-10-CM | POA: Diagnosis not present

## 2015-06-13 ENCOUNTER — Other Ambulatory Visit: Payer: Self-pay | Admitting: Nurse Practitioner

## 2015-06-14 ENCOUNTER — Other Ambulatory Visit (HOSPITAL_BASED_OUTPATIENT_CLINIC_OR_DEPARTMENT_OTHER): Payer: Medicare Other

## 2015-06-14 ENCOUNTER — Ambulatory Visit (HOSPITAL_BASED_OUTPATIENT_CLINIC_OR_DEPARTMENT_OTHER): Payer: Medicare Other

## 2015-06-14 VITALS — BP 109/64 | HR 82 | Temp 97.8°F | Resp 18

## 2015-06-14 DIAGNOSIS — C8580 Other specified types of non-Hodgkin lymphoma, unspecified site: Secondary | ICD-10-CM

## 2015-06-14 DIAGNOSIS — Z5112 Encounter for antineoplastic immunotherapy: Secondary | ICD-10-CM

## 2015-06-14 DIAGNOSIS — Z5111 Encounter for antineoplastic chemotherapy: Secondary | ICD-10-CM

## 2015-06-14 LAB — COMPREHENSIVE METABOLIC PANEL
ALT: 10 U/L (ref 0–55)
AST: 15 U/L (ref 5–34)
Albumin: 2.9 g/dL — ABNORMAL LOW (ref 3.5–5.0)
Alkaline Phosphatase: 60 U/L (ref 40–150)
Anion Gap: 11 mEq/L (ref 3–11)
BUN: 15.7 mg/dL (ref 7.0–26.0)
CHLORIDE: 107 meq/L (ref 98–109)
CO2: 23 meq/L (ref 22–29)
CREATININE: 0.8 mg/dL (ref 0.7–1.3)
Calcium: 9.4 mg/dL (ref 8.4–10.4)
EGFR: 88 mL/min/{1.73_m2} — ABNORMAL LOW (ref >90)
Glucose: 83 mg/dl (ref 70–140)
POTASSIUM: 3.7 meq/L (ref 3.5–5.1)
SODIUM: 141 meq/L (ref 136–145)
Total Bilirubin: 0.43 mg/dL (ref 0.20–1.20)
Total Protein: 6 g/dL — ABNORMAL LOW (ref 6.4–8.3)

## 2015-06-14 LAB — CBC WITH DIFFERENTIAL/PLATELET
BASO%: 0.6 % (ref 0.0–2.0)
BASOS ABS: 0.1 10*3/uL (ref 0.0–0.1)
EOS%: 2.2 % (ref 0.0–7.0)
Eosinophils Absolute: 0.2 10*3/uL (ref 0.0–0.5)
HCT: 41.2 % (ref 38.4–49.9)
HGB: 13 g/dL (ref 13.0–17.1)
LYMPH#: 2.8 10*3/uL (ref 0.9–3.3)
LYMPH%: 32.9 % (ref 14.0–49.0)
MCH: 29.1 pg (ref 27.2–33.4)
MCHC: 31.6 g/dL — AB (ref 32.0–36.0)
MCV: 92.4 fL (ref 79.3–98.0)
MONO#: 1 10*3/uL — ABNORMAL HIGH (ref 0.1–0.9)
MONO%: 11.4 % (ref 0.0–14.0)
NEUT#: 4.5 10*3/uL (ref 1.5–6.5)
NEUT%: 52.9 % (ref 39.0–75.0)
Platelets: 289 10*3/uL (ref 140–400)
RBC: 4.46 10*6/uL (ref 4.20–5.82)
RDW: 16.4 % — AB (ref 11.0–14.6)
WBC: 8.5 10*3/uL (ref 4.0–10.3)

## 2015-06-14 MED ORDER — SODIUM CHLORIDE 0.9 % IV SOLN
375.0000 mg/m2 | Freq: Once | INTRAVENOUS | Status: AC
  Administered 2015-06-14: 700 mg via INTRAVENOUS
  Filled 2015-06-14: qty 60

## 2015-06-14 MED ORDER — DIPHENHYDRAMINE HCL 25 MG PO CAPS
ORAL_CAPSULE | ORAL | Status: AC
  Filled 2015-06-14: qty 1

## 2015-06-14 MED ORDER — DEXAMETHASONE SODIUM PHOSPHATE 100 MG/10ML IJ SOLN
Freq: Once | INTRAMUSCULAR | Status: AC
  Administered 2015-06-14: 11:00:00 via INTRAVENOUS
  Filled 2015-06-14: qty 8

## 2015-06-14 MED ORDER — FAMOTIDINE 20 MG PO TABS
20.0000 mg | ORAL_TABLET | Freq: Once | ORAL | Status: AC
  Administered 2015-06-14: 20 mg via ORAL

## 2015-06-14 MED ORDER — SODIUM CHLORIDE 0.9 % IV SOLN
Freq: Once | INTRAVENOUS | Status: AC
  Administered 2015-06-14: 11:00:00 via INTRAVENOUS

## 2015-06-14 MED ORDER — CYCLOPHOSPHAMIDE CHEMO INJECTION 1 GM
750.0000 mg/m2 | Freq: Once | INTRAMUSCULAR | Status: AC
  Administered 2015-06-14: 1320 mg via INTRAVENOUS
  Filled 2015-06-14: qty 66

## 2015-06-14 MED ORDER — DIPHENHYDRAMINE HCL 25 MG PO CAPS
25.0000 mg | ORAL_CAPSULE | Freq: Once | ORAL | Status: AC
  Administered 2015-06-14: 25 mg via ORAL

## 2015-06-14 MED ORDER — VINCRISTINE SULFATE CHEMO INJECTION 1 MG/ML
2.0000 mg | Freq: Once | INTRAVENOUS | Status: AC
  Administered 2015-06-14: 2 mg via INTRAVENOUS
  Filled 2015-06-14: qty 2

## 2015-06-14 MED ORDER — FAMOTIDINE 20 MG PO TABS
ORAL_TABLET | ORAL | Status: AC
Start: 2015-06-14 — End: 2015-06-14
  Filled 2015-06-14: qty 1

## 2015-06-14 MED ORDER — ACETAMINOPHEN 325 MG PO TABS
650.0000 mg | ORAL_TABLET | Freq: Once | ORAL | Status: AC
  Administered 2015-06-14: 650 mg via ORAL

## 2015-06-14 MED ORDER — DOXORUBICIN HCL CHEMO IV INJECTION 2 MG/ML
50.0000 mg/m2 | Freq: Once | INTRAVENOUS | Status: AC
  Administered 2015-06-14: 88 mg via INTRAVENOUS
  Filled 2015-06-14: qty 44

## 2015-06-14 MED ORDER — ACETAMINOPHEN 325 MG PO TABS
ORAL_TABLET | ORAL | Status: AC
  Filled 2015-06-14: qty 2

## 2015-06-14 NOTE — Patient Instructions (Signed)
[facility] Discharge Instructions for Patients Receiving Chemotherapy  Today you received the following chemotherapy agents Adriamycin, Vincristine, Cytoxan and Rituxan.  To help prevent nausea and vomiting after your treatment, we encourage you to take your nausea medication as prescribed.   If you develop nausea and vomiting that is not controlled by your nausea medication, call the clinic.   BELOW ARE SYMPTOMS THAT SHOULD BE REPORTED IMMEDIATELY:  *FEVER GREATER THAN 100.5 F  *CHILLS WITH OR WITHOUT FEVER  NAUSEA AND VOMITING THAT IS NOT CONTROLLED WITH YOUR NAUSEA MEDICATION  *UNUSUAL SHORTNESS OF BREATH  *UNUSUAL BRUISING OR BLEEDING  TENDERNESS IN MOUTH AND THROAT WITH OR WITHOUT PRESENCE OF ULCERS  *URINARY PROBLEMS  *BOWEL PROBLEMS  UNUSUAL RASH Items with * indicate a potential emergency and should be followed up as soon as possible.  Feel free to call the clinic you have any questions or concerns. The clinic phone number is [phone].  Please show the CHEMO ALERT CARD at check-in to the Emergency Department and triage nurse.   

## 2015-06-15 DIAGNOSIS — G9340 Encephalopathy, unspecified: Secondary | ICD-10-CM | POA: Diagnosis not present

## 2015-06-15 DIAGNOSIS — D72829 Elevated white blood cell count, unspecified: Secondary | ICD-10-CM | POA: Diagnosis not present

## 2015-06-15 DIAGNOSIS — I8291 Chronic embolism and thrombosis of unspecified vein: Secondary | ICD-10-CM | POA: Diagnosis not present

## 2015-06-15 DIAGNOSIS — A419 Sepsis, unspecified organism: Secondary | ICD-10-CM | POA: Diagnosis not present

## 2015-06-15 DIAGNOSIS — T814XXA Infection following a procedure, initial encounter: Secondary | ICD-10-CM | POA: Diagnosis not present

## 2015-06-15 DIAGNOSIS — M21371 Foot drop, right foot: Secondary | ICD-10-CM | POA: Diagnosis not present

## 2015-06-15 DIAGNOSIS — C8380 Other non-follicular lymphoma, unspecified site: Secondary | ICD-10-CM | POA: Diagnosis not present

## 2015-06-15 DIAGNOSIS — E440 Moderate protein-calorie malnutrition: Secondary | ICD-10-CM | POA: Diagnosis not present

## 2015-06-15 DIAGNOSIS — L89302 Pressure ulcer of unspecified buttock, stage 2: Secondary | ICD-10-CM | POA: Diagnosis not present

## 2015-06-17 ENCOUNTER — Ambulatory Visit (HOSPITAL_BASED_OUTPATIENT_CLINIC_OR_DEPARTMENT_OTHER): Payer: Medicare Other

## 2015-06-17 VITALS — BP 95/59 | HR 92 | Temp 97.6°F

## 2015-06-17 DIAGNOSIS — Z5189 Encounter for other specified aftercare: Secondary | ICD-10-CM | POA: Diagnosis not present

## 2015-06-17 DIAGNOSIS — C8580 Other specified types of non-Hodgkin lymphoma, unspecified site: Secondary | ICD-10-CM | POA: Diagnosis not present

## 2015-06-17 MED ORDER — PEGFILGRASTIM INJECTION 6 MG/0.6ML ~~LOC~~
6.0000 mg | PREFILLED_SYRINGE | Freq: Once | SUBCUTANEOUS | Status: AC
  Administered 2015-06-17: 6 mg via SUBCUTANEOUS
  Filled 2015-06-17: qty 0.6

## 2015-06-18 DIAGNOSIS — D72829 Elevated white blood cell count, unspecified: Secondary | ICD-10-CM | POA: Diagnosis not present

## 2015-06-18 DIAGNOSIS — I8291 Chronic embolism and thrombosis of unspecified vein: Secondary | ICD-10-CM | POA: Diagnosis not present

## 2015-06-18 DIAGNOSIS — A419 Sepsis, unspecified organism: Secondary | ICD-10-CM | POA: Diagnosis not present

## 2015-06-18 DIAGNOSIS — G9340 Encephalopathy, unspecified: Secondary | ICD-10-CM | POA: Diagnosis not present

## 2015-06-18 DIAGNOSIS — E440 Moderate protein-calorie malnutrition: Secondary | ICD-10-CM | POA: Diagnosis not present

## 2015-06-18 DIAGNOSIS — L89302 Pressure ulcer of unspecified buttock, stage 2: Secondary | ICD-10-CM | POA: Diagnosis not present

## 2015-06-18 DIAGNOSIS — C8380 Other non-follicular lymphoma, unspecified site: Secondary | ICD-10-CM | POA: Diagnosis not present

## 2015-06-18 DIAGNOSIS — T814XXA Infection following a procedure, initial encounter: Secondary | ICD-10-CM | POA: Diagnosis not present

## 2015-06-18 DIAGNOSIS — M21371 Foot drop, right foot: Secondary | ICD-10-CM | POA: Diagnosis not present

## 2015-06-19 DIAGNOSIS — T814XXA Infection following a procedure, initial encounter: Secondary | ICD-10-CM | POA: Diagnosis not present

## 2015-06-19 DIAGNOSIS — D72829 Elevated white blood cell count, unspecified: Secondary | ICD-10-CM | POA: Diagnosis not present

## 2015-06-19 DIAGNOSIS — A419 Sepsis, unspecified organism: Secondary | ICD-10-CM | POA: Diagnosis not present

## 2015-06-19 DIAGNOSIS — I8291 Chronic embolism and thrombosis of unspecified vein: Secondary | ICD-10-CM | POA: Diagnosis not present

## 2015-06-19 DIAGNOSIS — G9340 Encephalopathy, unspecified: Secondary | ICD-10-CM | POA: Diagnosis not present

## 2015-06-19 DIAGNOSIS — L89302 Pressure ulcer of unspecified buttock, stage 2: Secondary | ICD-10-CM | POA: Diagnosis not present

## 2015-06-19 DIAGNOSIS — M21371 Foot drop, right foot: Secondary | ICD-10-CM | POA: Diagnosis not present

## 2015-06-19 DIAGNOSIS — C8380 Other non-follicular lymphoma, unspecified site: Secondary | ICD-10-CM | POA: Diagnosis not present

## 2015-06-19 DIAGNOSIS — E440 Moderate protein-calorie malnutrition: Secondary | ICD-10-CM | POA: Diagnosis not present

## 2015-06-21 ENCOUNTER — Other Ambulatory Visit: Payer: Self-pay | Admitting: *Deleted

## 2015-06-21 ENCOUNTER — Telehealth: Payer: Self-pay

## 2015-06-21 DIAGNOSIS — C8580 Other specified types of non-Hodgkin lymphoma, unspecified site: Secondary | ICD-10-CM

## 2015-06-21 DIAGNOSIS — E440 Moderate protein-calorie malnutrition: Secondary | ICD-10-CM | POA: Diagnosis not present

## 2015-06-21 DIAGNOSIS — L89302 Pressure ulcer of unspecified buttock, stage 2: Secondary | ICD-10-CM | POA: Diagnosis not present

## 2015-06-21 DIAGNOSIS — M21371 Foot drop, right foot: Secondary | ICD-10-CM | POA: Diagnosis not present

## 2015-06-21 DIAGNOSIS — C8380 Other non-follicular lymphoma, unspecified site: Secondary | ICD-10-CM | POA: Diagnosis not present

## 2015-06-21 DIAGNOSIS — A419 Sepsis, unspecified organism: Secondary | ICD-10-CM | POA: Diagnosis not present

## 2015-06-21 DIAGNOSIS — G9340 Encephalopathy, unspecified: Secondary | ICD-10-CM | POA: Diagnosis not present

## 2015-06-21 DIAGNOSIS — D72829 Elevated white blood cell count, unspecified: Secondary | ICD-10-CM | POA: Diagnosis not present

## 2015-06-21 DIAGNOSIS — T814XXA Infection following a procedure, initial encounter: Secondary | ICD-10-CM | POA: Diagnosis not present

## 2015-06-21 DIAGNOSIS — I8291 Chronic embolism and thrombosis of unspecified vein: Secondary | ICD-10-CM | POA: Diagnosis not present

## 2015-06-21 MED ORDER — HYDROMORPHONE HCL 2 MG PO TABS: 2.0000 mg | ORAL_TABLET | ORAL | Status: DC | PRN

## 2015-06-21 NOTE — Telephone Encounter (Signed)
Dilaudid Rx will be left in prescription book for pick up. 

## 2015-06-21 NOTE — Telephone Encounter (Signed)
Perry Nielsen is requesting dilaudid refill to be picked up Monday with lab appt.

## 2015-06-24 ENCOUNTER — Other Ambulatory Visit (HOSPITAL_BASED_OUTPATIENT_CLINIC_OR_DEPARTMENT_OTHER): Payer: Medicare Other

## 2015-06-24 DIAGNOSIS — C8580 Other specified types of non-Hodgkin lymphoma, unspecified site: Secondary | ICD-10-CM

## 2015-06-24 LAB — CBC WITH DIFFERENTIAL/PLATELET
BASO%: 0.7 % (ref 0.0–2.0)
Basophils Absolute: 0.1 10*3/uL (ref 0.0–0.1)
EOS%: 0.4 % (ref 0.0–7.0)
Eosinophils Absolute: 0 10*3/uL (ref 0.0–0.5)
HCT: 37.4 % — ABNORMAL LOW (ref 38.4–49.9)
HGB: 12.3 g/dL — ABNORMAL LOW (ref 13.0–17.1)
LYMPH%: 12.6 % — AB (ref 14.0–49.0)
MCH: 29.6 pg (ref 27.2–33.4)
MCHC: 32.9 g/dL (ref 32.0–36.0)
MCV: 89.9 fL (ref 79.3–98.0)
MONO#: 1.2 10*3/uL — ABNORMAL HIGH (ref 0.1–0.9)
MONO%: 10.6 % (ref 0.0–14.0)
NEUT%: 75.7 % — AB (ref 39.0–75.0)
NEUTROS ABS: 8.3 10*3/uL — AB (ref 1.5–6.5)
Platelets: 122 10*3/uL — ABNORMAL LOW (ref 140–400)
RBC: 4.16 10*6/uL — AB (ref 4.20–5.82)
RDW: 15.6 % — ABNORMAL HIGH (ref 11.0–14.6)
WBC: 11 10*3/uL — AB (ref 4.0–10.3)
lymph#: 1.4 10*3/uL (ref 0.9–3.3)
nRBC: 0 % (ref 0–0)

## 2015-06-25 DIAGNOSIS — E440 Moderate protein-calorie malnutrition: Secondary | ICD-10-CM | POA: Diagnosis not present

## 2015-06-25 DIAGNOSIS — M21371 Foot drop, right foot: Secondary | ICD-10-CM | POA: Diagnosis not present

## 2015-06-25 DIAGNOSIS — G9340 Encephalopathy, unspecified: Secondary | ICD-10-CM | POA: Diagnosis not present

## 2015-06-25 DIAGNOSIS — A419 Sepsis, unspecified organism: Secondary | ICD-10-CM | POA: Diagnosis not present

## 2015-06-25 DIAGNOSIS — C8380 Other non-follicular lymphoma, unspecified site: Secondary | ICD-10-CM | POA: Diagnosis not present

## 2015-06-25 DIAGNOSIS — T814XXA Infection following a procedure, initial encounter: Secondary | ICD-10-CM | POA: Diagnosis not present

## 2015-06-25 DIAGNOSIS — I8291 Chronic embolism and thrombosis of unspecified vein: Secondary | ICD-10-CM | POA: Diagnosis not present

## 2015-06-25 DIAGNOSIS — D72829 Elevated white blood cell count, unspecified: Secondary | ICD-10-CM | POA: Diagnosis not present

## 2015-06-25 DIAGNOSIS — L89302 Pressure ulcer of unspecified buttock, stage 2: Secondary | ICD-10-CM | POA: Diagnosis not present

## 2015-06-27 DIAGNOSIS — D72829 Elevated white blood cell count, unspecified: Secondary | ICD-10-CM | POA: Diagnosis not present

## 2015-06-27 DIAGNOSIS — E440 Moderate protein-calorie malnutrition: Secondary | ICD-10-CM | POA: Diagnosis not present

## 2015-06-27 DIAGNOSIS — C8380 Other non-follicular lymphoma, unspecified site: Secondary | ICD-10-CM | POA: Diagnosis not present

## 2015-06-27 DIAGNOSIS — I8291 Chronic embolism and thrombosis of unspecified vein: Secondary | ICD-10-CM | POA: Diagnosis not present

## 2015-06-27 DIAGNOSIS — A419 Sepsis, unspecified organism: Secondary | ICD-10-CM | POA: Diagnosis not present

## 2015-06-27 DIAGNOSIS — T814XXA Infection following a procedure, initial encounter: Secondary | ICD-10-CM | POA: Diagnosis not present

## 2015-06-27 DIAGNOSIS — L89302 Pressure ulcer of unspecified buttock, stage 2: Secondary | ICD-10-CM | POA: Diagnosis not present

## 2015-06-27 DIAGNOSIS — M21371 Foot drop, right foot: Secondary | ICD-10-CM | POA: Diagnosis not present

## 2015-06-27 DIAGNOSIS — G9340 Encephalopathy, unspecified: Secondary | ICD-10-CM | POA: Diagnosis not present

## 2015-07-01 ENCOUNTER — Other Ambulatory Visit: Payer: Self-pay | Admitting: Oncology

## 2015-07-02 DIAGNOSIS — G9340 Encephalopathy, unspecified: Secondary | ICD-10-CM | POA: Diagnosis not present

## 2015-07-02 DIAGNOSIS — D72829 Elevated white blood cell count, unspecified: Secondary | ICD-10-CM | POA: Diagnosis not present

## 2015-07-02 DIAGNOSIS — L89302 Pressure ulcer of unspecified buttock, stage 2: Secondary | ICD-10-CM | POA: Diagnosis not present

## 2015-07-02 DIAGNOSIS — M21371 Foot drop, right foot: Secondary | ICD-10-CM | POA: Diagnosis not present

## 2015-07-02 DIAGNOSIS — I8291 Chronic embolism and thrombosis of unspecified vein: Secondary | ICD-10-CM | POA: Diagnosis not present

## 2015-07-02 DIAGNOSIS — T814XXA Infection following a procedure, initial encounter: Secondary | ICD-10-CM | POA: Diagnosis not present

## 2015-07-02 DIAGNOSIS — A419 Sepsis, unspecified organism: Secondary | ICD-10-CM | POA: Diagnosis not present

## 2015-07-02 DIAGNOSIS — C8380 Other non-follicular lymphoma, unspecified site: Secondary | ICD-10-CM | POA: Diagnosis not present

## 2015-07-02 DIAGNOSIS — E440 Moderate protein-calorie malnutrition: Secondary | ICD-10-CM | POA: Diagnosis not present

## 2015-07-03 ENCOUNTER — Other Ambulatory Visit: Payer: Self-pay | Admitting: Oncology

## 2015-07-03 ENCOUNTER — Other Ambulatory Visit: Payer: Self-pay | Admitting: *Deleted

## 2015-07-04 DIAGNOSIS — A419 Sepsis, unspecified organism: Secondary | ICD-10-CM | POA: Diagnosis not present

## 2015-07-04 DIAGNOSIS — I8291 Chronic embolism and thrombosis of unspecified vein: Secondary | ICD-10-CM | POA: Diagnosis not present

## 2015-07-04 DIAGNOSIS — M21371 Foot drop, right foot: Secondary | ICD-10-CM | POA: Diagnosis not present

## 2015-07-04 DIAGNOSIS — E440 Moderate protein-calorie malnutrition: Secondary | ICD-10-CM | POA: Diagnosis not present

## 2015-07-04 DIAGNOSIS — G9340 Encephalopathy, unspecified: Secondary | ICD-10-CM | POA: Diagnosis not present

## 2015-07-04 DIAGNOSIS — T814XXA Infection following a procedure, initial encounter: Secondary | ICD-10-CM | POA: Diagnosis not present

## 2015-07-04 DIAGNOSIS — D72829 Elevated white blood cell count, unspecified: Secondary | ICD-10-CM | POA: Diagnosis not present

## 2015-07-04 DIAGNOSIS — C8380 Other non-follicular lymphoma, unspecified site: Secondary | ICD-10-CM | POA: Diagnosis not present

## 2015-07-04 DIAGNOSIS — L89302 Pressure ulcer of unspecified buttock, stage 2: Secondary | ICD-10-CM | POA: Diagnosis not present

## 2015-07-05 ENCOUNTER — Other Ambulatory Visit: Payer: Self-pay | Admitting: *Deleted

## 2015-07-05 ENCOUNTER — Other Ambulatory Visit (HOSPITAL_BASED_OUTPATIENT_CLINIC_OR_DEPARTMENT_OTHER): Payer: Medicare Other

## 2015-07-05 ENCOUNTER — Ambulatory Visit (HOSPITAL_BASED_OUTPATIENT_CLINIC_OR_DEPARTMENT_OTHER): Payer: Medicare Other

## 2015-07-05 ENCOUNTER — Ambulatory Visit (HOSPITAL_BASED_OUTPATIENT_CLINIC_OR_DEPARTMENT_OTHER): Payer: Medicare Other | Admitting: Oncology

## 2015-07-05 ENCOUNTER — Telehealth: Payer: Self-pay | Admitting: Oncology

## 2015-07-05 VITALS — BP 112/72 | HR 98 | Temp 98.3°F | Resp 18 | Wt 145.3 lb

## 2015-07-05 VITALS — BP 94/54 | HR 84 | Temp 97.5°F | Resp 18

## 2015-07-05 DIAGNOSIS — I82403 Acute embolism and thrombosis of unspecified deep veins of lower extremity, bilateral: Secondary | ICD-10-CM

## 2015-07-05 DIAGNOSIS — M79671 Pain in right foot: Secondary | ICD-10-CM

## 2015-07-05 DIAGNOSIS — Z5112 Encounter for antineoplastic immunotherapy: Secondary | ICD-10-CM

## 2015-07-05 DIAGNOSIS — C8580 Other specified types of non-Hodgkin lymphoma, unspecified site: Secondary | ICD-10-CM

## 2015-07-05 DIAGNOSIS — I4891 Unspecified atrial fibrillation: Secondary | ICD-10-CM

## 2015-07-05 DIAGNOSIS — Z5111 Encounter for antineoplastic chemotherapy: Secondary | ICD-10-CM

## 2015-07-05 LAB — CBC WITH DIFFERENTIAL/PLATELET
BASO%: 0.8 % (ref 0.0–2.0)
Basophils Absolute: 0.1 10*3/uL (ref 0.0–0.1)
EOS ABS: 0 10*3/uL (ref 0.0–0.5)
EOS%: 0.1 % (ref 0.0–7.0)
HEMATOCRIT: 40.8 % (ref 38.4–49.9)
HGB: 13 g/dL (ref 13.0–17.1)
LYMPH%: 11.8 % — AB (ref 14.0–49.0)
MCH: 29.7 pg (ref 27.2–33.4)
MCHC: 32 g/dL (ref 32.0–36.0)
MCV: 93 fL (ref 79.3–98.0)
MONO#: 1.7 10*3/uL — ABNORMAL HIGH (ref 0.1–0.9)
MONO%: 10.4 % (ref 0.0–14.0)
NEUT%: 76.9 % — ABNORMAL HIGH (ref 39.0–75.0)
NEUTROS ABS: 12.5 10*3/uL — AB (ref 1.5–6.5)
Platelets: ADEQUATE 10*3/uL (ref 140–400)
RBC: 4.39 10*6/uL (ref 4.20–5.82)
RDW: 16.9 % — AB (ref 11.0–14.6)
WBC: 16.2 10*3/uL — AB (ref 4.0–10.3)
lymph#: 1.9 10*3/uL (ref 0.9–3.3)

## 2015-07-05 LAB — COMPREHENSIVE METABOLIC PANEL
ALT: 17 U/L (ref 0–55)
AST: 19 U/L (ref 5–34)
Albumin: 3 g/dL — ABNORMAL LOW (ref 3.5–5.0)
Alkaline Phosphatase: 60 U/L (ref 40–150)
Anion Gap: 9 mEq/L (ref 3–11)
BUN: 13 mg/dL (ref 7.0–26.0)
CALCIUM: 9.3 mg/dL (ref 8.4–10.4)
CHLORIDE: 107 meq/L (ref 98–109)
CO2: 24 meq/L (ref 22–29)
CREATININE: 0.8 mg/dL (ref 0.7–1.3)
EGFR: 89 mL/min/{1.73_m2} — ABNORMAL LOW (ref >90)
Glucose: 98 mg/dl (ref 70–140)
Potassium: 3.8 mEq/L (ref 3.5–5.1)
Sodium: 140 mEq/L (ref 136–145)
TOTAL PROTEIN: 6.2 g/dL — AB (ref 6.4–8.3)
Total Bilirubin: <0.3 mg/dL (ref 0.20–1.20)

## 2015-07-05 MED ORDER — VINCRISTINE SULFATE CHEMO INJECTION 1 MG/ML
2.0000 mg | Freq: Once | INTRAVENOUS | Status: AC
  Administered 2015-07-05: 2 mg via INTRAVENOUS
  Filled 2015-07-05: qty 2

## 2015-07-05 MED ORDER — PEGFILGRASTIM 6 MG/0.6ML ~~LOC~~ PSKT
6.0000 mg | PREFILLED_SYRINGE | Freq: Once | SUBCUTANEOUS | Status: AC
  Administered 2015-07-05: 6 mg via SUBCUTANEOUS
  Filled 2015-07-05: qty 0.6

## 2015-07-05 MED ORDER — SODIUM CHLORIDE 0.9 % IV SOLN
Freq: Once | INTRAVENOUS | Status: AC
  Administered 2015-07-05: 12:00:00 via INTRAVENOUS

## 2015-07-05 MED ORDER — ACETAMINOPHEN 325 MG PO TABS
650.0000 mg | ORAL_TABLET | Freq: Once | ORAL | Status: AC
  Administered 2015-07-05: 650 mg via ORAL

## 2015-07-05 MED ORDER — DIPHENHYDRAMINE HCL 25 MG PO CAPS
25.0000 mg | ORAL_CAPSULE | Freq: Once | ORAL | Status: AC
  Administered 2015-07-05: 25 mg via ORAL

## 2015-07-05 MED ORDER — ACETAMINOPHEN 325 MG PO TABS
ORAL_TABLET | ORAL | Status: AC
  Filled 2015-07-05: qty 2

## 2015-07-05 MED ORDER — DOXORUBICIN HCL CHEMO IV INJECTION 2 MG/ML
48.0000 mg/m2 | Freq: Once | INTRAVENOUS | Status: AC
  Administered 2015-07-05: 88 mg via INTRAVENOUS
  Filled 2015-07-05: qty 44

## 2015-07-05 MED ORDER — FAMOTIDINE 20 MG PO TABS
20.0000 mg | ORAL_TABLET | Freq: Once | ORAL | Status: AC
  Administered 2015-07-05: 20 mg via ORAL

## 2015-07-05 MED ORDER — GABAPENTIN 300 MG PO CAPS: 300.0000 mg | ORAL_CAPSULE | Freq: Two times a day (BID) | ORAL | Status: DC

## 2015-07-05 MED ORDER — FAMOTIDINE IN NACL 20-0.9 MG/50ML-% IV SOLN
INTRAVENOUS | Status: AC
  Filled 2015-07-05: qty 50

## 2015-07-05 MED ORDER — SODIUM CHLORIDE 0.9 % IV SOLN
Freq: Once | INTRAVENOUS | Status: AC
  Administered 2015-07-05: 13:00:00 via INTRAVENOUS
  Filled 2015-07-05: qty 8

## 2015-07-05 MED ORDER — SODIUM CHLORIDE 0.9 % IV SOLN
375.0000 mg/m2 | Freq: Once | INTRAVENOUS | Status: AC
  Administered 2015-07-05: 700 mg via INTRAVENOUS
  Filled 2015-07-05: qty 70

## 2015-07-05 MED ORDER — SODIUM CHLORIDE 0.9 % IV SOLN
720.0000 mg/m2 | Freq: Once | INTRAVENOUS | Status: AC
  Administered 2015-07-05: 1320 mg via INTRAVENOUS
  Filled 2015-07-05: qty 66

## 2015-07-05 MED ORDER — FAMOTIDINE 20 MG PO TABS
ORAL_TABLET | ORAL | Status: AC
  Filled 2015-07-05: qty 1

## 2015-07-05 MED ORDER — DIPHENHYDRAMINE HCL 25 MG PO CAPS
ORAL_CAPSULE | ORAL | Status: AC
  Filled 2015-07-05: qty 2

## 2015-07-05 NOTE — Patient Instructions (Signed)
[facility] Discharge Instructions for Patients Receiving Chemotherapy  Today you received the following chemotherapy agents: Rituxan, Adriamycin, Vincristine, Cytoxan   To help prevent nausea and vomiting after your treatment, we encourage you to take your nausea medication as directed.    If you develop nausea and vomiting that is not controlled by your nausea medication, call the clinic.   BELOW ARE SYMPTOMS THAT SHOULD BE REPORTED IMMEDIATELY:  *FEVER GREATER THAN 100.5 F  *CHILLS WITH OR WITHOUT FEVER  NAUSEA AND VOMITING THAT IS NOT CONTROLLED WITH YOUR NAUSEA MEDICATION  *UNUSUAL SHORTNESS OF BREATH  *UNUSUAL BRUISING OR BLEEDING  TENDERNESS IN MOUTH AND THROAT WITH OR WITHOUT PRESENCE OF ULCERS  *URINARY PROBLEMS  *BOWEL PROBLEMS  UNUSUAL RASH Items with * indicate a potential emergency and should be followed up as soon as possible.  Feel free to call the clinic you have any questions or concerns. The clinic phone number is [phone].  Please show the [facility] at check-in to the Emergency Department and triage nurse.

## 2015-07-05 NOTE — Telephone Encounter (Signed)
GAVE PATIENT WIFE AVS REPORT AND APPOINTMENTS FOR January  °

## 2015-07-05 NOTE — Progress Notes (Signed)
[place] OFFICE PROGRESS NOTE   Diagnosis:  Non-Hodgkin's lymphoma  INTERVAL HISTORY:    Mr. Micciche returns as scheduled. He completed another cycle of CHOP-rituximab 06/14/2015. He reports tolerating the chemotherapy well. No neuropathy symptoms. He had malaise during the week following chemotherapy. He has a good appetite. His participating in physical therapy. He is ambulating the home. He continues to have pain at the right foot. He takes Dilaudid in the evening.   Objective:  Vital signs in last 24 hours:  Blood pressure 112/72, pulse 98, temperature 98.3 F (36.8 C), temperature source Oral, resp. rate 18, weight 145 lb 4.8 oz (65.908 kg), SpO2 98 %.    HEENT:  No thrush, 3 mm ulcer at the left buccal mucosa Lymphatics:  No cervical or supra-clavicular nodes Resp:  Lungs clear bilaterally Cardio:  Regular rate and rhythm GI:  No hepatomegaly, nontender Vascular:  Pitting edema at the right greater than left lower leg Neuro: right foot drop, the leg strength is otherwise intact. He is able to ambulate independently.  Skin: rash   Portacath/PICC-without erythema  Lab Results:  Lab Results  Component Value Date   WBC 16.2* 07/05/2015   HGB 13.0 07/05/2015   HCT 40.8 07/05/2015   MCV 93.0 07/05/2015   PLT Clumped Platelets--Appears Adequate 07/05/2015   NEUTROABS 12.5* 07/05/2015    Lab Results  Component Value Date   NA 140 07/05/2015    No results found for: CEA  Imaging:  No results found.  Medications: I have reviewed the patient's current medications.  Assessment/Plan: 1. 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 History of thrombocytopenia predating chemotherapy 3. T7 compression fracture-likely pathologic 4. 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 Right foot pain-likely secondary to nerve compression from the sacral mass, improved 6. Diffuse erythematous rash, appearance consistent with a "drug" rash-resolved 7. History of low-grade fever likely secondary to atelectasis/postop fever versus tumor fever 8. Hepatitis B core antibody positive 9. Altered mental status-likely secondary delirium in the setting of infection and hospitalization 10. Admission 04/18/2015 through 04/23/2015 presenting with fever; amylase and lipase elevated.   Abdominal CT showed inflammation in the left upper quadrant and associated loculated fluid collections status post drainage procedure, fluid with high levels of amylase and lipase; cultures negative; question pancreatitis.   04/26/2015 lipase mildly elevated, amylase normal.   CT abdomen/pelvis 05/14/2015 with a persistent 6.5 x 3.8 cm posterior pancreatic tail fluid collection similar in size to the 04/18/2015 CT study. Additional smaller fluid collections in the far lateral left upper quadrant; extensive patchy groundglass opacity at the lung bases, significantly increased; stable expansile lytic lesion in the upper right sacrum. 11. Bilateral lower extremity DVTs on a Doppler 04/18/2015, now on Lovenox. 12. Severe neutropenia 05/13/2015. Likely delayed effect of Rituxan. Resolved 13. Chest x-ray 05/15/2015 with bilateral mid to lower lung interstitial thickening with some hazy lower lung zone groundglass opacity. Partially improved on the chest x-ray  05/24/2015 14. New-onset rapid atrial fibrillation 05/29/2015     Disposition:  Mr. Steel has an improved performance status. The plan is to proceed with cycle 4 CHOP-rituximab today. He will return for an office visit and chemotherapy in 3 weeks. He will changed to Onpro Neulasta with this cycle. He will begin a trial of Neurontin for the right foot pain. He will discontinue Neurontin for somnolence. The Megace may be contributing to the lower extremities edema. He will stop the Megace.  Betsy Coder, MD  07/05/2015  11:07 AM

## 2015-07-06 ENCOUNTER — Ambulatory Visit: Payer: Self-pay

## 2015-07-07 ENCOUNTER — Emergency Department (HOSPITAL_COMMUNITY): Payer: Medicare Other

## 2015-07-07 ENCOUNTER — Inpatient Hospital Stay (HOSPITAL_COMMUNITY)
Admission: EM | Admit: 2015-07-07 | Discharge: 2015-07-09 | DRG: 438 | Disposition: A | Discharge status: Home Health | Payer: Medicare Other | Attending: Internal Medicine | Admitting: Internal Medicine

## 2015-07-07 ENCOUNTER — Encounter (HOSPITAL_COMMUNITY): Payer: Self-pay

## 2015-07-07 DIAGNOSIS — R1013 Epigastric pain: Secondary | ICD-10-CM | POA: Diagnosis present

## 2015-07-07 DIAGNOSIS — Z923 Personal history of irradiation: Secondary | ICD-10-CM | POA: Diagnosis not present

## 2015-07-07 DIAGNOSIS — K859 Acute pancreatitis without necrosis or infection, unspecified: Secondary | ICD-10-CM | POA: Diagnosis present

## 2015-07-07 DIAGNOSIS — C7951 Secondary malignant neoplasm of bone: Secondary | ICD-10-CM | POA: Diagnosis present

## 2015-07-07 DIAGNOSIS — Z79899 Other long term (current) drug therapy: Secondary | ICD-10-CM | POA: Diagnosis not present

## 2015-07-07 DIAGNOSIS — Z7952 Long term (current) use of systemic steroids: Secondary | ICD-10-CM | POA: Diagnosis not present

## 2015-07-07 DIAGNOSIS — R351 Nocturia: Secondary | ICD-10-CM | POA: Diagnosis present

## 2015-07-07 DIAGNOSIS — M21371 Foot drop, right foot: Secondary | ICD-10-CM | POA: Diagnosis not present

## 2015-07-07 DIAGNOSIS — I482 Chronic atrial fibrillation: Secondary | ICD-10-CM | POA: Diagnosis not present

## 2015-07-07 DIAGNOSIS — K8689 Other specified diseases of pancreas: Secondary | ICD-10-CM | POA: Insufficient documentation

## 2015-07-07 DIAGNOSIS — T458X5A Adverse effect of other primarily systemic and hematological agents, initial encounter: Secondary | ICD-10-CM | POA: Diagnosis not present

## 2015-07-07 DIAGNOSIS — Z9841 Cataract extraction status, right eye: Secondary | ICD-10-CM | POA: Diagnosis not present

## 2015-07-07 DIAGNOSIS — K863 Pseudocyst of pancreas: Principal | ICD-10-CM | POA: Diagnosis present

## 2015-07-07 DIAGNOSIS — Z7901 Long term (current) use of anticoagulants: Secondary | ICD-10-CM

## 2015-07-07 DIAGNOSIS — Z86718 Personal history of other venous thrombosis and embolism: Secondary | ICD-10-CM

## 2015-07-07 DIAGNOSIS — Z8249 Family history of ischemic heart disease and other diseases of the circulatory system: Secondary | ICD-10-CM | POA: Diagnosis not present

## 2015-07-07 DIAGNOSIS — Z8049 Family history of malignant neoplasm of other genital organs: Secondary | ICD-10-CM

## 2015-07-07 DIAGNOSIS — G629 Polyneuropathy, unspecified: Secondary | ICD-10-CM | POA: Diagnosis present

## 2015-07-07 DIAGNOSIS — R109 Unspecified abdominal pain: Secondary | ICD-10-CM | POA: Diagnosis present

## 2015-07-07 DIAGNOSIS — G589 Mononeuropathy, unspecified: Secondary | ICD-10-CM | POA: Diagnosis not present

## 2015-07-07 DIAGNOSIS — K5900 Constipation, unspecified: Secondary | ICD-10-CM | POA: Diagnosis present

## 2015-07-07 DIAGNOSIS — K8500 Idiopathic acute pancreatitis without necrosis or infection: Secondary | ICD-10-CM | POA: Diagnosis not present

## 2015-07-07 DIAGNOSIS — K219 Gastro-esophageal reflux disease without esophagitis: Secondary | ICD-10-CM | POA: Diagnosis not present

## 2015-07-07 DIAGNOSIS — K869 Disease of pancreas, unspecified: Secondary | ICD-10-CM | POA: Diagnosis not present

## 2015-07-07 DIAGNOSIS — R1010 Upper abdominal pain, unspecified: Secondary | ICD-10-CM | POA: Diagnosis not present

## 2015-07-07 DIAGNOSIS — C8590 Non-Hodgkin lymphoma, unspecified, unspecified site: Secondary | ICD-10-CM | POA: Diagnosis not present

## 2015-07-07 DIAGNOSIS — R748 Abnormal levels of other serum enzymes: Secondary | ICD-10-CM | POA: Diagnosis not present

## 2015-07-07 DIAGNOSIS — C8190 Hodgkin lymphoma, unspecified, unspecified site: Secondary | ICD-10-CM | POA: Diagnosis present

## 2015-07-07 DIAGNOSIS — Z961 Presence of intraocular lens: Secondary | ICD-10-CM | POA: Diagnosis present

## 2015-07-07 DIAGNOSIS — I4891 Unspecified atrial fibrillation: Secondary | ICD-10-CM | POA: Diagnosis not present

## 2015-07-07 DIAGNOSIS — C8580 Other specified types of non-Hodgkin lymphoma, unspecified site: Secondary | ICD-10-CM | POA: Diagnosis not present

## 2015-07-07 DIAGNOSIS — D72829 Elevated white blood cell count, unspecified: Secondary | ICD-10-CM | POA: Insufficient documentation

## 2015-07-07 DIAGNOSIS — I10 Essential (primary) hypertension: Secondary | ICD-10-CM | POA: Diagnosis not present

## 2015-07-07 DIAGNOSIS — N401 Enlarged prostate with lower urinary tract symptoms: Secondary | ICD-10-CM | POA: Diagnosis present

## 2015-07-07 DIAGNOSIS — Z9842 Cataract extraction status, left eye: Secondary | ICD-10-CM | POA: Diagnosis not present

## 2015-07-07 DIAGNOSIS — G893 Neoplasm related pain (acute) (chronic): Secondary | ICD-10-CM | POA: Diagnosis present

## 2015-07-07 DIAGNOSIS — N529 Male erectile dysfunction, unspecified: Secondary | ICD-10-CM | POA: Diagnosis present

## 2015-07-07 DIAGNOSIS — Z888 Allergy status to other drugs, medicaments and biological substances status: Secondary | ICD-10-CM | POA: Diagnosis not present

## 2015-07-07 DIAGNOSIS — I82403 Acute embolism and thrombosis of unspecified deep veins of lower extremity, bilateral: Secondary | ICD-10-CM | POA: Diagnosis not present

## 2015-07-07 DIAGNOSIS — Z85828 Personal history of other malignant neoplasm of skin: Secondary | ICD-10-CM | POA: Diagnosis not present

## 2015-07-07 DIAGNOSIS — M8448XA Pathological fracture, other site, initial encounter for fracture: Secondary | ICD-10-CM | POA: Diagnosis present

## 2015-07-07 LAB — DIFFERENTIAL
BASOS PCT: 0 %
Basophils Absolute: 0 10*3/uL (ref 0.0–0.1)
Eosinophils Absolute: 0 10*3/uL (ref 0.0–0.7)
Eosinophils Relative: 0 %
LYMPHS ABS: 1.5 10*3/uL (ref 0.7–4.0)
Lymphocytes Relative: 2 %
MONOS PCT: 2 %
Monocytes Absolute: 1.5 10*3/uL — ABNORMAL HIGH (ref 0.1–1.0)
NEUTROS ABS: 70.8 10*3/uL — AB (ref 1.7–7.7)
NEUTROS PCT: 96 %

## 2015-07-07 LAB — URINALYSIS, ROUTINE W REFLEX MICROSCOPIC
BILIRUBIN URINE: NEGATIVE
GLUCOSE, UA: NEGATIVE mg/dL
HGB URINE DIPSTICK: NEGATIVE
Ketones, ur: NEGATIVE mg/dL
Leukocytes, UA: NEGATIVE
Nitrite: NEGATIVE
Protein, ur: NEGATIVE mg/dL
SPECIFIC GRAVITY, URINE: 1.02 (ref 1.005–1.030)
pH: 5.5 (ref 5.0–8.0)

## 2015-07-07 LAB — COMPREHENSIVE METABOLIC PANEL
ALBUMIN: 3.1 g/dL — AB (ref 3.5–5.0)
ALK PHOS: 57 U/L (ref 38–126)
ALT: 19 U/L (ref 17–63)
AST: 27 U/L (ref 15–41)
Anion gap: 10 (ref 5–15)
BUN: 15 mg/dL (ref 6–20)
CALCIUM: 9.1 mg/dL (ref 8.9–10.3)
CHLORIDE: 105 mmol/L (ref 101–111)
CO2: 24 mmol/L (ref 22–32)
CREATININE: 0.69 mg/dL (ref 0.61–1.24)
GFR calc Af Amer: >60 mL/min (ref >60)
GFR calc non Af Amer: >60 mL/min (ref >60)
GLUCOSE: 103 mg/dL — AB (ref 65–99)
Potassium: 3.4 mmol/L — ABNORMAL LOW (ref 3.5–5.1)
SODIUM: 139 mmol/L (ref 135–145)
Total Bilirubin: 0.5 mg/dL (ref 0.3–1.2)
Total Protein: 5.8 g/dL — ABNORMAL LOW (ref 6.5–8.1)

## 2015-07-07 LAB — CBC
HCT: 37.3 % — ABNORMAL LOW (ref 39.0–52.0)
Hemoglobin: 11.9 g/dL — ABNORMAL LOW (ref 13.0–17.0)
MCH: 29.8 pg (ref 26.0–34.0)
MCHC: 31.9 g/dL (ref 30.0–36.0)
MCV: 93.3 fL (ref 78.0–100.0)
PLATELETS: 260 10*3/uL (ref 150–400)
RBC: 4 MIL/uL — ABNORMAL LOW (ref 4.22–5.81)
RDW: 16.7 % — ABNORMAL HIGH (ref 11.5–15.5)
WBC: 73.8 10*3/uL (ref 4.0–10.5)

## 2015-07-07 LAB — LIPASE, BLOOD: Lipase: 214 U/L — ABNORMAL HIGH (ref 11–51)

## 2015-07-07 MED ORDER — IOHEXOL 300 MG/ML  SOLN
100.0000 mL | Freq: Once | INTRAMUSCULAR | Status: AC | PRN
  Administered 2015-07-07: 100 mL via INTRAVENOUS

## 2015-07-07 MED ORDER — MORPHINE SULFATE (PF) 4 MG/ML IV SOLN
4.0000 mg | Freq: Once | INTRAVENOUS | Status: AC
  Administered 2015-07-07: 4 mg via INTRAVENOUS
  Filled 2015-07-07: qty 1

## 2015-07-07 MED ORDER — FLUTICASONE PROPIONATE 50 MCG/ACT NA SUSP: 1.0000 | Freq: Every day | NASAL | Status: DC | PRN

## 2015-07-07 MED ORDER — OXYMETAZOLINE HCL 0.05 % NA SOLN
1.0000 | Freq: Two times a day (BID) | NASAL | Status: DC | PRN
  Administered 2015-07-07: 1 via NASAL
  Filled 2015-07-07: qty 15

## 2015-07-07 MED ORDER — SODIUM CHLORIDE 0.9 % IV BOLUS (SEPSIS)
1000.0000 mL | Freq: Once | INTRAVENOUS | Status: AC
  Administered 2015-07-07: 1000 mL via INTRAVENOUS

## 2015-07-07 MED ORDER — HYDROMORPHONE HCL 1 MG/ML IJ SOLN
1.0000 mg | INTRAMUSCULAR | Status: DC | PRN
  Administered 2015-07-07 – 2015-07-08 (×2): 1 mg via INTRAVENOUS
  Filled 2015-07-07 (×2): qty 1

## 2015-07-07 MED ORDER — METHYLPREDNISOLONE SODIUM SUCC 125 MG IJ SOLR
60.0000 mg | INTRAMUSCULAR | Status: DC
  Filled 2015-07-07 (×2): qty 0.96

## 2015-07-07 MED ORDER — POLYETHYL GLYCOL-PROPYL GLYCOL 0.4-0.3 % OP GEL: Freq: Every day | OPHTHALMIC | Status: DC | PRN

## 2015-07-07 MED ORDER — KCL IN DEXTROSE-NACL 20-5-0.9 MEQ/L-%-% IV SOLN
INTRAVENOUS | Status: DC
  Administered 2015-07-07 – 2015-07-08 (×2): via INTRAVENOUS
  Filled 2015-07-07 (×3): qty 1000

## 2015-07-07 MED ORDER — FAMOTIDINE IN NACL 20-0.9 MG/50ML-% IV SOLN
20.0000 mg | Freq: Two times a day (BID) | INTRAVENOUS | Status: DC
  Administered 2015-07-07 – 2015-07-08 (×3): 20 mg via INTRAVENOUS
  Filled 2015-07-07 (×4): qty 50

## 2015-07-07 MED ORDER — ENOXAPARIN SODIUM 80 MG/0.8ML ~~LOC~~ SOLN
1.0000 mg/kg | Freq: Two times a day (BID) | SUBCUTANEOUS | Status: DC
  Administered 2015-07-07 – 2015-07-08 (×3): 65 mg via SUBCUTANEOUS
  Filled 2015-07-07 (×4): qty 0.8

## 2015-07-07 NOTE — H&P (Signed)
History and Physical  Perry Nielsen. TML:[id number] DOB: 1941/06/29 DOA: 07/07/2015  Referring physician: EDP PCP: Joycelyn Man, MD   Chief Complaint: ab pain  HPI: Perry Nielsen. is a 75 y.o. 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 EDP consulted oncology , recommended admit to hospitalist service, oncology will see patient in house.  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 ALLERGIC RHINITIS 08/03/2007  . GERD 02/18/2007  . KNEE PAIN, LEFT, CHRONIC 08/07/2008  . BACK PAIN 09/18/2009  . HAMMER TOE 08/03/2007  . CAROTID BRUIT, LEFT 08/07/2008  . 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 Foot drop     rt foot  . Rash, skin     torso  . Hepatitis 1977    no residual problems  . Sleeping difficulty   . Skin cancer   . Lymphoma [facility])    Past Surgical History  Procedure Laterality Date  . Shoulder open rotator cuff repair  07/06/05    right  . Knee arthroscopy      left  . Tonsillectomy and adenoidectomy    . Transurethral resection of prostate    . Cataract extraction w/ intraocular lens  implant, bilateral    .  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 He does not have any smokeless tobacco history on file. He reports that he does not use illicit drugs. His alcohol history is not on file. Patient lives at home& is able to participate in activities of daily living independently with a walker due to right foot drop  Allergies  Allergen Reactions  . Antihistamines, Diphenhydramine-Type     CAUSES PROSTATE TO SWELL & as a child had hyperactivity when took Benadryl Pt took Pepcid w/ Benadryl PO and did fine per Pt & his wife    Family History  Problem Relation Age of Onset  . Heart attack Other   . Aneurysm Other     aortic  . Coronary artery disease Sister   . Cancer Father     prostate  . Pulmonary fibrosis Mother   . Uterine cancer Mother       Prior to Admission medications   Medication Sig Start Date End Date Taking? Authorizing Provider  famotidine (PEPCID) 20 MG tablet Take 20 mg by mouth daily.    Yes Historical Provider, MD  feeding supplement, ENSURE ENLIVE, (ENSURE ENLIVE) LIQD Take 237  mLs by mouth 2 (two) times daily between meals. Patient taking differently: Take 237 mLs by mouth 2 (two) times daily between meals. Ensure Clear 03/27/15  Yes Annita Brod, MD  fluticasone (FLONASE) 50 MCG/ACT nasal spray Place 1 spray into both nostrils daily as needed for allergies.    Yes Historical Provider, MD  gabapentin (NEURONTIN) 300 MG capsule Take 1 capsule (300 mg total) by mouth 2 (two) times daily. 07/05/15  Yes Ladell Pier, MD  HYDROmorphone (DILAUDID) 2 MG tablet Take 1 tablet (2 mg total) by mouth every 4 (four) hours as needed for severe pain. 06/21/15  Yes Susanne Borders, NP  ibuprofen (ADVIL,MOTRIN) 200 MG tablet Take 400 mg by mouth every 4 (four) hours as needed for fever, headache, mild pain,  moderate pain or cramping.   Yes Historical Provider, MD  loperamide (IMODIUM A-D) 2 MG tablet Take 2 mg by mouth 4 (four) times daily as needed for diarrhea or loose stools.   Yes Historical Provider, MD  LORazepam (ATIVAN) 0.5 MG tablet Take 1 tablet (0.5 mg total) by mouth 2 (two) times daily as needed for anxiety. 04/16/15  Yes Dorena Cookey, MD  megestrol (MEGACE) 40 MG/ML suspension Take 5 mLs by mouth daily. 07/04/15  Yes Historical Provider, MD  oxymetazoline (AFRIN) 0.05 % nasal spray Place 1 spray into both nostrils 2 (two) times daily as needed for congestion.   Yes Historical Provider, MD  Polyethyl Glycol-Propyl Glycol (SYSTANE OP) Apply 1 drop to eye daily as needed (dryness).   Yes Historical Provider, MD  predniSONE (DELTASONE) 20 MG tablet TAKE 4 TABLETS(80 MG) BY MOUTH DAILY FOR 5 DAYS 07/05/15  Yes Ladell Pier, MD  prochlorperazine (COMPAZINE) 5 MG tablet TAKE 1 TO 2 TABLETS(5 TO 10 MG) BY MOUTH EVERY 6 HOURS AS NEEDED FOR NAUSEA OR VOMITING 06/14/15  Yes Ladell Pier, MD  rivaroxaban (XARELTO) 20 MG TABS tablet Take 1 tablet (20 mg total) by mouth daily with supper. 06/11/15  Yes Maryanna Shape, NP    Physical Exam: BP 134/72 mmHg  Pulse 70  Temp(Src) 97.8 F (36.6 C) (Oral)  Resp 15  Ht 6' (1.829 m)  Wt 143 lb 11.8 oz (65.2 kg)  BMI 19.49 kg/m2  SpO2 100%  General:  AAOx3, NAD Eyes: PERRL ENT: unremarkable Neck: supple, no JVD Cardiovascular: IRRR Respiratory: CTABL Abdomen: mild tender lower quadrant, soft/ND/ND, positive bowel sounds Skin: no rash Musculoskeletal:   Right lower extremity pitting edema, right foot drop which is chronic Psychiatric: calm/cooperative Neurologic: no focal findings            Labs on Admission:  Basic Metabolic Panel:  Recent Labs Lab 07/05/15 0946 07/07/15 1304  NA 140 139  K 3.8 3.4*  CL  --  105  CO2 24 24  GLUCOSE 98 103*  BUN 13.0 15  CREATININE 0.8 0.69  CALCIUM 9.3 9.1   Liver Function  Tests:  Recent Labs Lab 07/05/15 0946 07/07/15 1304  AST 19 27  ALT 17 19  ALKPHOS 60 57  BILITOT <0.30 0.5  PROT 6.2* 5.8*  ALBUMIN 3.0* 3.1*    Recent Labs Lab 07/07/15 1304  LIPASE 214*   No results for input(s): AMMONIA in the last 168 hours. CBC:  Recent Labs Lab 07/05/15 0946 07/07/15 1304  WBC 16.2* 73.8*  NEUTROABS 12.5* 70.8*  HGB 13.0 11.9*  HCT 40.8 37.3*  MCV 93.0 93.3  PLT Clumped Platelets--Appears Adequate 260   Cardiac Enzymes: No  results for input(s): CKTOTAL, CKMB, CKMBINDEX, TROPONINI in the last 168 hours.  BNP (last 3 results) No results for input(s): BNP in the last 8760 hours.  ProBNP (last 3 results) No results for input(s): PROBNP in the last 8760 hours.  CBG: No results for input(s): GLUCAP in the last 168 hours.  Radiological Exams on Admission: Dg Abd 1 View  07/07/2015  CLINICAL DATA:  75 year old male with acute abdominal pain and constipation. History of non-Hodgkin's lymphoma. EXAM: ABDOMEN - 1 VIEW COMPARISON:  05/14/2015 CT and prior exams FINDINGS: A small to moderate amount of stool within the colon and rectum noted. Nondistended gas-filled loops of small bowel are present. No acute bony abnormalities are noted. No suspicious calcifications are present. IMPRESSION: Nonspecific nonobstructive bowel gas pattern. Small to moderate amount of stool within the colon and rectum. Electronically Signed   By: Margarette Canada M.D.   On: 07/07/2015 13:31   Ct Abdomen Pelvis W Contrast  07/07/2015  CLINICAL DATA:  Abdominal pain.  Non-Hodgkin's lymphoma. EXAM: CT ABDOMEN AND PELVIS WITH CONTRAST TECHNIQUE: Multidetector CT imaging of the abdomen and pelvis was performed using the standard protocol following bolus administration of intravenous contrast. CONTRAST:  186m OMNIPAQUE IOHEXOL 300 MG/ML  SOLN COMPARISON:  CT scan dated 05/14/2015 FINDINGS: Lower chest: There is chronic interstitial lung disease at the bases. Small blebs anteriorly at the  right lung base. Heart size is normal. Hepatobiliary: Normal. Pancreas: The cystic lesion adjacent to the posterior aspect of the tail of the pancreas has enlarged and is now primarily solid measuring 9.5 x 6.7 cm. Multiple small peritoneal nodules in the left pericolic gutter have slightly increased in size. I suspect this is recurrent lymphoma. There is soft tissue stranding around these multiple lesions. The pancreatic parenchyma appears normal. Spleen: The spleen is been removed. Adrenals/Urinary Tract: Stable benign bilateral renal cysts. Ureters and bladder are normal. Stomach/Bowel: There is soft tissue stranding around the posterior aspect of the fundus of the stomach. The mass in the left upper quadrant touches the posterior wall of the stomach. There is soft tissue stranding around the splenic flexure of the colon but there is no visible invasion of the colon or obstruction of the bowel. The bowel otherwise appears normal including the terminal ileum and appendix. Vascular/Lymphatic: Aortic atherosclerosis.  No adenopathy. Reproductive: Normal. Other: Tiny amount of free fluid in the pelvis. Small amount of fluid in the left pericolic gutter. Musculoskeletal: No significant change in the destructive lesion in the right side of the sacrum. No other discrete osseous lesions. IMPRESSION: The cystic lesion adjacent to the tail of the pancreas is now an enlarged soft tissue mass lesion. The nodular soft tissue lesions in the left upper quadrant have also progressed. This is felt represent recurrent Hodgkin's lymphoma. Electronically Signed   By: JLorriane ShireM.D.   On: 07/07/2015 16:45     Assessment/Plan Present on Admission:  . Abdominal pain . Pancreatitis  Ab pain/ elevated lipase/ new pancreatic mass: no n/v. npo, ivf/ prn pain meds. Oncology consulted, may need  Biopsy. Lymphoma recurrent?  Significant leukocytosis: No overt sign of infection, ua unremarkable, ct ab dose has new pancreatic  mass, no cough, lung clear, From neulasta/prednisone?  wbc 73.8, patient received neulasta on 12/31, on prednisone 878mpo qd from 12/30 , last dose suppose on 1/4, patient reported did not have leukocytosis with same regimen during previous chemo cycles. Will check ldh, uric acid, phos, monitor cr, leukocyte alk phos, oncology consulted.  Lymphoma  with spine mets finished XRT in 03/2015, on chemo with RCHOP, last on 12/30, oncology consulted by EDP, will see patient in house.  Chronic afib: rate controlled without meds, on xarelto, change to lovenox while npo.  H/o bilateral lower extremity DVT, on xarelto, change to lovenox while npo.  Cancer pain: prn dilaudid   DVT prophylaxis: on full dose anticoagulation  Consultants:  EDP consulted Dr. Marin Olp, oncology will see patient for significant leukocytosis (wbc 73.8), pancreatic mass, lymphoma.  Code Status: full   Family Communication:  Patient and wife  Disposition Plan: admit to med tele ( chronic afib)  Time spent: 35mns  Davon Folta MD, PhD [facility] Pager [phone] 7PM-7AM, please contact night-coverage at [email or web address], password [email or web address]

## 2015-07-07 NOTE — ED Notes (Addendum)
Awake. Verbally responsive. Resp even and unlabored. No audible adventitious breath sounds noted. ABC's intact. Abd soft/nondistended but tender to palpate. BS (+) and active x4 quadrants. No N/V/D reported. LBM was today but pt reported having problems with constipation for past 2 weeks and taking Miralax at home. Pt denies fever and urinary problems.

## 2015-07-07 NOTE — ED Notes (Signed)
Bed: WA19 Expected date:  Expected time:  Means of arrival:  Comments: Triage 2 

## 2015-07-07 NOTE — Progress Notes (Signed)
ANTICOAGULATION CONSULT NOTE - Initial Consult  Pharmacy Consult for enoxaparin Indication: VTE  Allergies  Allergen Reactions  . Antihistamines, Diphenhydramine-Type     CAUSES PROSTATE TO SWELL & as a child had hyperactivity when took Benadryl Pt took Pepcid w/ Benadryl PO and did fine per Pt & his wife    Patient Measurements: Height: 6' (182.9 cm) Weight: 143 lb 11.8 oz (65.2 kg) IBW/kg (Calculated) : 77.6   Vital Signs: Temp: 97.8 F (36.6 C) (01/01 1827) Temp Source: Oral (01/01 1827) BP: 134/72 mmHg (01/01 1827) Pulse Rate: 70 (01/01 1827)  Labs:  Recent Labs  07/05/15 0946 07/05/15 0946 07/07/15 1304  HGB 13.0  --  11.9*  HCT 40.8  --  37.3*  PLT Clumped Platelets--Appears Adequate  --  260  CREATININE  --  0.8 0.69    Estimated Creatinine Clearance: 75.8 mL/min (by C-G formula based on Cr of 0.69).   Medical History: Past Medical History  Diagnosis Date  . 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 ALLERGIC RHINITIS 08/03/2007  . GERD 02/18/2007  . KNEE PAIN, LEFT, CHRONIC 08/07/2008  . BACK PAIN 09/18/2009  . HAMMER TOE 08/03/2007  . CAROTID BRUIT, LEFT 08/07/2008  . 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 Foot drop     rt foot  . Rash, skin     torso  . Hepatitis 1977    no residual problems  . Sleeping difficulty   . Skin cancer   . Lymphoma [facility]      Assessment: 75 year old male presents with abdominal pain and constipation.  He is currently being treated with chemotherapy for non-hodgkin's lymphoma. CT remarkable for cystic lesion adjacent to the tail of pancreas now enlarged soft tissue mass, nodular soft tissue lesions in the left upper quadrant have also progressed, felt to represent recurrent hodgkin's lymphoma.  Patient on xarelto for hx of DVT, switch to enoxaparin while NPO.  H/H WNL Plts WNL   Goal of Therapy:  Anti-Xa level 0.6-1 units/ml 4hrs after LMWH dose given Monitor  platelets by anticoagulation protocol   Plan:  Enoxaparin 1mg /kg (65mg ) SQ q12h for CrCl >39mls/min CBC q72 hours Follow renal function  Dolly Rias RPh 07/07/2015, 6:52 PM Pager [phone]

## 2015-07-07 NOTE — ED Provider Notes (Signed)
CSN: [id number]     Arrival date & time 07/07/15  1225 History   First MD Initiated Contact with Patient 07/07/15 1444     Chief Complaint  Patient presents with  . Abdominal Pain    HPI   Perry Nielsen is a 75 y.o. male with a PMH of lymphoma (last chemo Friday), hepatitis, GERD who presents to the ED with abdominal pain and constipation, which he states has been present for the past few days. He reports constant epigastric abdominal pain. He denies exacerbating or alleviating factors. He denies fever, chills, chest pain, shortness of breath, nausea, vomiting, diarrhea, hematochezia, melena. He states his last bowel movement was this morning.  Past Medical History  Diagnosis Date  . 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 ALLERGIC RHINITIS 08/03/2007  . GERD 02/18/2007  . KNEE PAIN, LEFT, CHRONIC 08/07/2008  . BACK PAIN 09/18/2009  . HAMMER TOE 08/03/2007  . CAROTID BRUIT, LEFT 08/07/2008  . 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 Foot drop     rt foot  . Rash, skin     torso  . Hepatitis 1977    no residual problems  . Sleeping difficulty   . Skin cancer   . Lymphoma [facility])    Past Surgical History  Procedure Laterality Date  . Shoulder open rotator cuff repair  07/06/05    right  . Knee arthroscopy      left  . Tonsillectomy and adenoidectomy    . Transurethral resection of prostate    . Cataract extraction w/ intraocular lens  implant, bilateral    . 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 Heart attack Other   . Aneurysm Other     aortic  . Coronary artery disease Sister   . Cancer Father     prostate  . Pulmonary fibrosis Mother   . Uterine cancer Mother    Social History  Substance Use Topics  . Smoking status: Never Smoker   .  Smokeless tobacco: None  . Alcohol Use: None     Comment: 2-3 times a week. Beer.      Review of Systems  Constitutional: Negative for fever and chills.  Respiratory: Negative for shortness of breath.   Cardiovascular: Negative for chest pain.  Gastrointestinal: Positive for abdominal pain and constipation. Negative for nausea, vomiting, diarrhea and blood in stool.  Genitourinary: Negative for dysuria, urgency and frequency.      Allergies  Antihistamines, diphenhydramine-type  Home Medications   Prior to Admission medications   Medication Sig Start Date End Date Taking? Authorizing Provider  famotidine (PEPCID) 20 MG tablet Take 20 mg by mouth daily.    Yes Historical Provider, MD  feeding supplement, ENSURE ENLIVE, (ENSURE ENLIVE) LIQD Take 237 mLs by mouth 2 (two) times daily between meals. Patient taking differently: Take 237 mLs by mouth 2 (two) times daily between meals. Ensure Clear 03/27/15  Yes Annita Brod, MD  fluticasone (FLONASE) 50 MCG/ACT nasal spray Place 1 spray into both nostrils daily as needed for allergies.    Yes Historical Provider, MD  gabapentin (NEURONTIN) 300 MG capsule Take 1 capsule (300 mg total) by mouth 2 (two) times daily. 07/05/15  Yes Ladell Pier, MD  HYDROmorphone (DILAUDID)  2 MG tablet Take 1 tablet (2 mg total) by mouth every 4 (four) hours as needed for severe pain. 06/21/15  Yes Susanne Borders, NP  ibuprofen (ADVIL,MOTRIN) 200 MG tablet Take 400 mg by mouth every 4 (four) hours as needed for fever, headache, mild pain, moderate pain or cramping.   Yes Historical Provider, MD  loperamide (IMODIUM A-D) 2 MG tablet Take 2 mg by mouth 4 (four) times daily as needed for diarrhea or loose stools.   Yes Historical Provider, MD  LORazepam (ATIVAN) 0.5 MG tablet Take 1 tablet (0.5 mg total) by mouth 2 (two) times daily as needed for anxiety. 04/16/15  Yes Dorena Cookey, MD  megestrol (MEGACE) 40 MG/ML suspension Take 5 mLs by mouth daily.  07/04/15  Yes Historical Provider, MD  oxymetazoline (AFRIN) 0.05 % nasal spray Place 1 spray into both nostrils 2 (two) times daily as needed for congestion.   Yes Historical Provider, MD  Polyethyl Glycol-Propyl Glycol (SYSTANE OP) Apply 1 drop to eye daily as needed (dryness).   Yes Historical Provider, MD  predniSONE (DELTASONE) 20 MG tablet TAKE 4 TABLETS(80 MG) BY MOUTH DAILY FOR 5 DAYS 07/05/15  Yes Ladell Pier, MD  prochlorperazine (COMPAZINE) 5 MG tablet TAKE 1 TO 2 TABLETS(5 TO 10 MG) BY MOUTH EVERY 6 HOURS AS NEEDED FOR NAUSEA OR VOMITING 06/14/15  Yes Ladell Pier, MD  rivaroxaban (XARELTO) 20 MG TABS tablet Take 1 tablet (20 mg total) by mouth daily with supper. 06/11/15  Yes Maryanna Shape, NP    BP 110/70 mmHg  Pulse 78  Temp(Src) 97.5 F (36.4 C) (Oral)  Resp 15  SpO2 99% Physical Exam  Constitutional: He is oriented to person, place, and time. He appears well-developed and well-nourished. No distress.  HENT:  Head: Normocephalic and atraumatic.  Right Ear: External ear normal.  Left Ear: External ear normal.  Nose: Nose normal.  Mouth/Throat: Uvula is midline, oropharynx is clear and moist and mucous membranes are normal.  Eyes: Conjunctivae, EOM and lids are normal. Pupils are equal, round, and reactive to light. Right eye exhibits no discharge. Left eye exhibits no discharge. No scleral icterus.  Neck: Normal range of motion. Neck supple.  Cardiovascular: Normal rate, regular rhythm, normal heart sounds, intact distal pulses and normal pulses.   Pulmonary/Chest: Effort normal and breath sounds normal. No respiratory distress. He has no wheezes. He has no rales.  Abdominal: Soft. Normal appearance and bowel sounds are normal. He exhibits no distension and no mass. There is tenderness. There is no rigidity, no rebound and no guarding.  TTP in epigastrium. No rebound, guarding, or masses.  Musculoskeletal: Normal range of motion. He exhibits no edema or tenderness.   Neurological: He is alert and oriented to person, place, and time.  Skin: Skin is warm, dry and intact. No rash noted. He is not diaphoretic. No erythema. No pallor.  Psychiatric: He has a normal mood and affect. His speech is normal and behavior is normal.  Nursing note and vitals reviewed.   ED Course  Procedures (including critical care time) Labs Review Labs Reviewed  LIPASE, BLOOD - Abnormal; Notable for the following:    Lipase 214 (*)    All other components within normal limits  COMPREHENSIVE METABOLIC PANEL - Abnormal; Notable for the following:    Potassium 3.4 (*)    Glucose, Bld 103 (*)    Total Protein 5.8 (*)    Albumin 3.1 (*)    All other components  within normal limits  CBC - Abnormal; Notable for the following:    WBC 73.8 (*)    RBC 4.00 (*)    Hemoglobin 11.9 (*)    HCT 37.3 (*)    RDW 16.7 (*)    All other components within normal limits  URINALYSIS, ROUTINE W REFLEX MICROSCOPIC (NOT AT [facility]) - Abnormal; Notable for the following:    Color, Urine AMBER (*)    All other components within normal limits  DIFFERENTIAL - Abnormal; Notable for the following:    Neutro Abs 70.8 (*)    Monocytes Absolute 1.5 (*)    All other components within normal limits  DIFFERENTIAL  PATHOLOGIST SMEAR REVIEW    Imaging Review Dg Abd 1 View  07/07/2015  CLINICAL DATA:  75 year old male with acute abdominal pain and constipation. History of non-Hodgkin's lymphoma. EXAM: ABDOMEN - 1 VIEW COMPARISON:  05/14/2015 CT and prior exams FINDINGS: A small to moderate amount of stool within the colon and rectum noted. Nondistended gas-filled loops of small bowel are present. No acute bony abnormalities are noted. No suspicious calcifications are present. IMPRESSION: Nonspecific nonobstructive bowel gas pattern. Small to moderate amount of stool within the colon and rectum. Electronically Signed   By: Margarette Canada M.D.   On: 07/07/2015 13:31   Ct Abdomen Pelvis W Contrast  07/07/2015   CLINICAL DATA:  Abdominal pain.  Non-Hodgkin's lymphoma. EXAM: CT ABDOMEN AND PELVIS WITH CONTRAST TECHNIQUE: Multidetector CT imaging of the abdomen and pelvis was performed using the standard protocol following bolus administration of intravenous contrast. CONTRAST:  133mL OMNIPAQUE IOHEXOL 300 MG/ML  SOLN COMPARISON:  CT scan dated 05/14/2015 FINDINGS: Lower chest: There is chronic interstitial lung disease at the bases. Small blebs anteriorly at the right lung base. Heart size is normal. Hepatobiliary: Normal. Pancreas: The cystic lesion adjacent to the posterior aspect of the tail of the pancreas has enlarged and is now primarily solid measuring 9.5 x 6.7 cm. Multiple small peritoneal nodules in the left pericolic gutter have slightly increased in size. I suspect this is recurrent lymphoma. There is soft tissue stranding around these multiple lesions. The pancreatic parenchyma appears normal. Spleen: The spleen is been removed. Adrenals/Urinary Tract: Stable benign bilateral renal cysts. Ureters and bladder are normal. Stomach/Bowel: There is soft tissue stranding around the posterior aspect of the fundus of the stomach. The mass in the left upper quadrant touches the posterior wall of the stomach. There is soft tissue stranding around the splenic flexure of the colon but there is no visible invasion of the colon or obstruction of the bowel. The bowel otherwise appears normal including the terminal ileum and appendix. Vascular/Lymphatic: Aortic atherosclerosis.  No adenopathy. Reproductive: Normal. Other: Tiny amount of free fluid in the pelvis. Small amount of fluid in the left pericolic gutter. Musculoskeletal: No significant change in the destructive lesion in the right side of the sacrum. No other discrete osseous lesions. IMPRESSION: The cystic lesion adjacent to the tail of the pancreas is now an enlarged soft tissue mass lesion. The nodular soft tissue lesions in the left upper quadrant have also  progressed. This is felt represent recurrent Hodgkin's lymphoma. Electronically Signed   By: Lorriane Shire M.D.   On: 07/07/2015 16:45   I have personally reviewed and evaluated these images and lab results as part of my medical decision-making.   EKG Interpretation None      MDM   Final diagnoses:  Abdominal pain  Leukocytosis    75 year old male presents  with abdominal pain and constipation. Of note, he is currently being treated with chemotherapy for non-hodgkin's lymphoma. Denies fever, chills, chest pain, shortness of breath, nausea, vomiting, diarrhea, hematochezia, melena. States he had a bowel movement this morning.  Patient is afebrile. Vital signs stable. Heart regular rate and rhythm. Lungs clear to auscultation bilaterally. Abdomen soft, nondistended, with tenderness palpation in epigastric region. No rebound, guarding, or masses.  Patient given 1L NS bolus and pain medication.  CBC remarkable for leukocytosis of 73.8, hemoglobin 11.9. CMP remarkable for potassium 3.4. Lipase elevated at 214. UA negative for infection. Will obtain CT abdomen and pelvis.  CT remarkable for cystic lesion adjacent to the tail of pancreas now enlarged soft tissue mass, nodular soft tissue lesions in the left upper quadrant have also progressed, felt to represent recurrent hodgkin's lymphoma.  Hospitalist consulted for admission. Spoke with Dr. Erlinda Hong, who advised obtaining differential and consulting oncology to ensure patient is safe to admit here given significant leukocytosis. Differential remarkable for 70.8 neutrophils, moderate left shift, increased bands. Oncology consulted. Spoke with Dr. Marin Olp, who advised this is likely due to the patient having received neulasta with his last chemo 12/30, and he does not feel this represents a heme/onc emergency. Called Dr. Erlinda Hong, who will admit the patient for further evaluation and management. Asked to talk with oncology to follow the patient during  hospital course given. Spoke with Dr. Marin Olp, who advised oncology will see the patient.   BP 110/70 mmHg  Pulse 78  Temp(Src) 97.5 F (36.4 C) (Oral)  Resp 15  SpO2 99%     Marella Chimes, PA-C 07/07/15 1748  Malvin Johns, MD 07/08/15 [phone]

## 2015-07-07 NOTE — ED Notes (Signed)
Pt presents with c/o abdominal pain. Pt is a cancer patient, last chemo was Friday. Pt reports that he is having some abdominal pain and has trouble having a bowel movement. Reports that the abdominal pain is in the center of his stomach.

## 2015-07-08 DIAGNOSIS — R1013 Epigastric pain: Secondary | ICD-10-CM

## 2015-07-08 DIAGNOSIS — K8500 Idiopathic acute pancreatitis without necrosis or infection: Secondary | ICD-10-CM

## 2015-07-08 LAB — COMPREHENSIVE METABOLIC PANEL
ALK PHOS: 64 U/L (ref 38–126)
ALT: 12 U/L — AB (ref 17–63)
AST: 23 U/L (ref 15–41)
Albumin: 2.7 g/dL — ABNORMAL LOW (ref 3.5–5.0)
Anion gap: 9 (ref 5–15)
BUN: 12 mg/dL (ref 6–20)
CALCIUM: 8.5 mg/dL — AB (ref 8.9–10.3)
CO2: 26 mmol/L (ref 22–32)
CREATININE: 0.53 mg/dL — AB (ref 0.61–1.24)
Chloride: 106 mmol/L (ref 101–111)
Glucose, Bld: 97 mg/dL (ref 65–99)
Potassium: 3.6 mmol/L (ref 3.5–5.1)
Sodium: 141 mmol/L (ref 135–145)
TOTAL PROTEIN: 4.9 g/dL — AB (ref 6.5–8.1)
Total Bilirubin: 0.6 mg/dL (ref 0.3–1.2)

## 2015-07-08 LAB — CBC WITH DIFFERENTIAL/PLATELET
Band Neutrophils: 0 %
Basophils Absolute: 0.2 10*3/uL — ABNORMAL HIGH (ref 0.0–0.1)
Basophils Relative: 0 %
Blasts: 0 %
EOS ABS: 0 10*3/uL (ref 0.0–0.7)
Eosinophils Relative: 0 %
HEMATOCRIT: 32.2 % — AB (ref 39.0–52.0)
HEMOGLOBIN: 10.3 g/dL — AB (ref 13.0–17.0)
Lymphocytes Relative: 2 %
Lymphs Abs: 1.1 10*3/uL (ref 0.7–4.0)
MCH: 29.4 pg (ref 26.0–34.0)
MCHC: 32 g/dL (ref 30.0–36.0)
MCV: 92 fL (ref 78.0–100.0)
MONO ABS: 0.9 10*3/uL (ref 0.1–1.0)
MONOS PCT: 1 %
Metamyelocytes Relative: 0 %
Myelocytes: 0 %
NEUTROS ABS: 63 10*3/uL — AB (ref 1.7–7.7)
Neutrophils Relative %: 97 %
Other: 0 %
PLATELETS: 205 10*3/uL (ref 150–400)
PROMYELOCYTES ABS: 0 %
RBC: 3.5 MIL/uL — AB (ref 4.22–5.81)
RDW: 16.6 % — ABNORMAL HIGH (ref 11.5–15.5)
WBC: 65.3 10*3/uL — AB (ref 4.0–10.5)
nRBC: 0 /100 WBC

## 2015-07-08 LAB — PHOSPHORUS: Phosphorus: 4.4 mg/dL (ref 2.5–4.6)

## 2015-07-08 LAB — LACTATE DEHYDROGENASE: LDH: 309 U/L — AB (ref 98–192)

## 2015-07-08 LAB — URIC ACID: Uric Acid, Serum: 4.3 mg/dL — ABNORMAL LOW (ref 4.4–7.6)

## 2015-07-08 LAB — LIPASE, BLOOD: LIPASE: 36 U/L (ref 11–51)

## 2015-07-08 MED ORDER — POLYETHYLENE GLYCOL 3350 17 G PO PACK
17.0000 g | PACK | Freq: Every day | ORAL | Status: DC
  Administered 2015-07-08: 17 g via ORAL
  Filled 2015-07-08 (×2): qty 1

## 2015-07-08 MED ORDER — SENNOSIDES-DOCUSATE SODIUM 8.6-50 MG PO TABS
1.0000 | ORAL_TABLET | Freq: Two times a day (BID) | ORAL | Status: DC
  Administered 2015-07-08 (×2): 1 via ORAL
  Filled 2015-07-08 (×2): qty 1

## 2015-07-08 NOTE — Progress Notes (Addendum)
[facility] PROGRESS NOTE  Perry Nielsen. 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 wbc 65K from 73K yesterday -patient reported did nothave leukocytosis with same regimen during previous chemo cycles. -oncology consulted.  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  H/o bilateral lower extremity DVT, on xarelto, changed to lovenox while npo.  Cancer pain: prn dilaudid  DVT prophylaxis: on full dose anticoagulation  Code Status:Full Code Family Communication: none at bedside Disposition Plan: Home pending workup   Consultants:  Onc pending  HPI/Subjective: abd pain improved, no BM in 2days  Objective: Filed Vitals:   07/07/15 1948 07/08/15 0423  BP: 117/77 114/69  Pulse: 81 84  Temp: 98 F (36.7 C) 98.3 F (36.8 C)  Resp: 18 18    Intake/Output Summary (Last 24 hours) at 07/08/15 0843 Last data filed at 07/08/15 0700  Gross per 24 hour  Intake  882.5 ml  Output    875 ml  Net    7.5 ml   Filed Weights   07/07/15 1827 07/07/15 1948  Weight: 65.2 kg (143 lb 11.8 oz) 63.8 kg (140 lb 10.5 oz)    Exam:   General:  AAOx3  Cardiovascular: S1S2/RRR  Respiratory: CTAB  Abdomen: soft, Nt, BS present  Musculoskeletal:RLE edema and foot drop   Data Reviewed: Basic Metabolic Panel:  Recent Labs Lab 07/05/15 0946 07/07/15 1304  07/08/15 0548  NA 140 139 141  K 3.8 3.4* 3.6  CL  --  105 106  CO2 24 24 26   GLUCOSE 98 103* 97  BUN 13.0 15 12  CREATININE 0.8 0.69 0.53*  CALCIUM 9.3 9.1 8.5*  PHOS  --   --  4.4   Liver Function Tests:  Recent Labs Lab 07/05/15 0946 07/07/15 1304 07/08/15 0548  AST 19 27 23   ALT 17 19 12*  ALKPHOS 60 57 64  BILITOT <0.30 0.5 0.6  PROT 6.2* 5.8* 4.9*  ALBUMIN 3.0* 3.1* 2.7*    Recent Labs Lab 07/07/15 1304 07/08/15 0548  LIPASE 214* 36   No results for input(s): AMMONIA in the last 168 hours. CBC:  Recent Labs Lab 07/05/15 0946 07/07/15 1304 07/08/15 0548  WBC 16.2* 73.8* 65.3*  NEUTROABS 12.5* 70.8* 63.0*  HGB 13.0 11.9* 10.3*  HCT 40.8 37.3* 32.2*  MCV 93.0 93.3 92.0  PLT Clumped Platelets--Appears Adequate 260 205   Cardiac Enzymes: No results for input(s): CKTOTAL, CKMB, CKMBINDEX, TROPONINI in the last 168 hours. BNP (last 3 results) No results for input(s): BNP in the last 8760 hours.  ProBNP (last 3 results) No results for input(s): PROBNP in the last 8760 hours.  CBG: No results for input(s): GLUCAP in the last 168 hours.  No results found for this or any previous visit (from the past 240 hour(s)).   Studies: Dg Abd 1 View  07/07/2015  CLINICAL DATA:  75 year old male with acute abdominal pain and constipation. History of non-Hodgkin's lymphoma. EXAM: ABDOMEN - 1 VIEW COMPARISON:  05/14/2015 CT  and prior exams FINDINGS: A small to moderate amount of stool within the colon and rectum noted. Nondistended gas-filled loops of small bowel are present. No acute bony abnormalities are noted. No suspicious calcifications are present. IMPRESSION: Nonspecific nonobstructive bowel gas pattern. Small to moderate amount of stool within the colon and rectum. Electronically Signed   By: Margarette Canada M.D.   On: 07/07/2015 13:31   Ct Abdomen Pelvis W Contrast  07/07/2015  CLINICAL DATA:  Abdominal pain.  Non-Hodgkin's lymphoma. EXAM: CT ABDOMEN AND PELVIS WITH  CONTRAST TECHNIQUE: Multidetector CT imaging of the abdomen and pelvis was performed using the standard protocol following bolus administration of intravenous contrast. CONTRAST:  190mL OMNIPAQUE IOHEXOL 300 MG/ML  SOLN COMPARISON:  CT scan dated 05/14/2015 FINDINGS: Lower chest: There is chronic interstitial lung disease at the bases. Small blebs anteriorly at the right lung base. Heart size is normal. Hepatobiliary: Normal. Pancreas: The cystic lesion adjacent to the posterior aspect of the tail of the pancreas has enlarged and is now primarily solid measuring 9.5 x 6.7 cm. Multiple small peritoneal nodules in the left pericolic gutter have slightly increased in size. I suspect this is recurrent lymphoma. There is soft tissue stranding around these multiple lesions. The pancreatic parenchyma appears normal. Spleen: The spleen is been removed. Adrenals/Urinary Tract: Stable benign bilateral renal cysts. Ureters and bladder are normal. Stomach/Bowel: There is soft tissue stranding around the posterior aspect of the fundus of the stomach. The mass in the left upper quadrant touches the posterior wall of the stomach. There is soft tissue stranding around the splenic flexure of the colon but there is no visible invasion of the colon or obstruction of the bowel. The bowel otherwise appears normal including the terminal ileum and appendix. Vascular/Lymphatic: Aortic atherosclerosis.  No adenopathy. Reproductive: Normal. Other: Tiny amount of free fluid in the pelvis. Small amount of fluid in the left pericolic gutter. Musculoskeletal: No significant change in the destructive lesion in the right side of the sacrum. No other discrete osseous lesions. IMPRESSION: The cystic lesion adjacent to the tail of the pancreas is now an enlarged soft tissue mass lesion. The nodular soft tissue lesions in the left upper quadrant have also progressed. This is felt represent recurrent Hodgkin's lymphoma. Electronically Signed   By:  Lorriane Shire M.D.   On: 07/07/2015 16:45    Scheduled Meds: . enoxaparin (LOVENOX) injection  1 mg/kg Subcutaneous Q12H  . famotidine (PEPCID) IV  20 mg Intravenous Q12H  . methylPREDNISolone (SOLU-MEDROL) injection  60 mg Intravenous Q24H   Continuous Infusions: . dextrose 5 % and 0.9 % NaCl with KCl 20 mEq/L 75 mL/hr at 07/07/15 1954   Antibiotics Given (last 72 hours)    None      Active Problems:   Abdominal pain   Pancreatitis    Time spent: 65min    Chidera Dearcos  [facility] Pager [phone]. If 7PM-7AM, please contact night-coverage at [email or web address], password [facility] 07/08/2015, 8:43 AM  LOS: 1 day

## 2015-07-08 NOTE — Progress Notes (Signed)
CRITICAL VALUE ALERT  Critical value received:  WBC 65.3  Date of notification:  1/2  Time of notification:  0745  Critical value read back: Yes  Nurse who received alert: Durwin Nora  Critical Value is consistent with previous values.

## 2015-07-08 NOTE — Evaluation (Signed)
Physical Therapy Evaluation Patient Details Name: Perry Nielsen. MRN: [id number] DOB: [birth year] Today's Date: 07/08/2015   History of Present Illness  75 yo male admitted with abdominal pain, imaging showed mass on pancreas. Hx of lymphoma-spinal mets, Hep, R drop foot-sacral mass  Clinical Impression  On eval, pt was Min guard assist for mobility-walked ~150 feet with RW. Pt tolerated activity fairly well. Noted fatigue towards end of distance. Recommend HHPT follow up.     Follow Up Recommendations Home health PT;Supervision - Intermittent    Equipment Recommendations       Recommendations for Other Services       Precautions / Restrictions Precautions Precautions: Fall Precaution Comments: R drop foot Restrictions Weight Bearing Restrictions: No      Mobility  Bed Mobility Overal bed mobility: Modified Independent                Transfers Overall transfer level: Needs assistance Equipment used: Rolling walker (2 wheeled) Transfers: Sit to/from Stand Sit to Stand: Supervision         General transfer comment: for safety  Ambulation/Gait Ambulation/Gait assistance: Min guard Ambulation Distance (Feet): 150 Feet Assistive device: Rolling walker (2 wheeled) Gait Pattern/deviations: Step-through pattern;Step-to pattern;Trunk flexed     General Gait Details: VCs safety, proper use of walker. Noted fatigue towards end of distance.   Stairs            Wheelchair Mobility    Modified Rankin (Stroke Patients Only)       Balance Overall balance assessment: Needs assistance         Standing balance support: During functional activity Standing balance-Leahy Scale: Fair Standing balance comment: requires use of walker                             Pertinent Vitals/Pain Pain Assessment: 0-10 Pain Score: 2  Pain Location: abdomen    Home Living Family/patient expects to be discharged to:: Private residence Living  Arrangements: Spouse/significant other Available Help at Discharge: Family Type of Home: House Home Access: Stairs to enter   Technical brewer of Steps: 2 Home Layout: Two level;Bed/bath upstairs Home Equipment: Walker - standard;Cane - single point;Shower seat;Grab bars - tub/shower      Prior Function Level of Independence: Independent with assistive device(s)         Comments: uses walker. chronic drop foot     Hand Dominance        Extremity/Trunk Assessment   Upper Extremity Assessment: Overall WFL for tasks assessed           Lower Extremity Assessment: RLE deficits/detail RLE Deficits / Details: chronic drop foot    Cervical / Trunk Assessment: Normal  Communication   Communication: No difficulties  Cognition Arousal/Alertness: Awake/alert Behavior During Therapy: WFL for tasks assessed/performed Overall Cognitive Status: Within Functional Limits for tasks assessed                      General Comments      Exercises        Assessment/Plan    PT Assessment Patient needs continued PT services  PT Diagnosis Difficulty walking;Generalized weakness;Acute pain;Abnormality of gait   PT Problem List Decreased strength;Decreased range of motion;Decreased activity tolerance;Decreased balance;Pain;Decreased mobility  PT Treatment Interventions Functional mobility training;Therapeutic activities;Patient/family education;Gait training;Balance training;Therapeutic exercise;DME instruction   PT Goals (Current goals can be found in the Care Plan section) Acute Rehab PT Goals Patient Stated  Goal: home soon PT Goal Formulation: With patient Time For Goal Achievement: 07/22/15 Potential to Achieve Goals: Good    Frequency Min 3X/week   Barriers to discharge        Co-evaluation               End of Session Equipment Utilized During Treatment: Gait belt Activity Tolerance: Patient tolerated treatment well Patient left: in bed;with  call bell/phone within reach           Time: 0940-1017 PT Time Calculation (min) (ACUTE ONLY): 37 min   Charges:   PT Evaluation $Initial PT Evaluation Tier I: 1 Procedure PT Treatments $Gait Training: 8-22 mins   PT G Codes:        Weston Anna, MPT Pager: [phone]

## 2015-07-08 NOTE — Progress Notes (Signed)
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

## 2015-07-09 ENCOUNTER — Other Ambulatory Visit: Payer: Self-pay | Admitting: *Deleted

## 2015-07-09 DIAGNOSIS — I4891 Unspecified atrial fibrillation: Secondary | ICD-10-CM

## 2015-07-09 DIAGNOSIS — K8689 Other specified diseases of pancreas: Secondary | ICD-10-CM | POA: Insufficient documentation

## 2015-07-09 DIAGNOSIS — R1010 Upper abdominal pain, unspecified: Secondary | ICD-10-CM

## 2015-07-09 DIAGNOSIS — D72829 Elevated white blood cell count, unspecified: Secondary | ICD-10-CM

## 2015-07-09 DIAGNOSIS — M79671 Pain in right foot: Secondary | ICD-10-CM

## 2015-07-09 DIAGNOSIS — K869 Disease of pancreas, unspecified: Secondary | ICD-10-CM

## 2015-07-09 DIAGNOSIS — C8580 Other specified types of non-Hodgkin lymphoma, unspecified site: Secondary | ICD-10-CM

## 2015-07-09 DIAGNOSIS — I82403 Acute embolism and thrombosis of unspecified deep veins of lower extremity, bilateral: Secondary | ICD-10-CM

## 2015-07-09 LAB — LEUKOCYTE ALKALINE PHOSPHATASE: Leukocyte Alkaline  Phos Stain: 230 — ABNORMAL HIGH (ref 25–130)

## 2015-07-09 LAB — CBC
HCT: 30.4 % — ABNORMAL LOW (ref 39.0–52.0)
Hemoglobin: 10.2 g/dL — ABNORMAL LOW (ref 13.0–17.0)
MCH: 30.9 pg (ref 26.0–34.0)
MCHC: 33.6 g/dL (ref 30.0–36.0)
MCV: 92.1 fL (ref 78.0–100.0)
PLATELETS: 134 10*3/uL — AB (ref 150–400)
RBC: 3.3 MIL/uL — AB (ref 4.22–5.81)
RDW: 16.6 % — AB (ref 11.5–15.5)
WBC: 42.2 10*3/uL — ABNORMAL HIGH (ref 4.0–10.5)

## 2015-07-09 LAB — CEA: CEA: 2 ng/mL (ref 0.0–4.7)

## 2015-07-09 LAB — BASIC METABOLIC PANEL
Anion gap: 7 (ref 5–15)
BUN: 12 mg/dL (ref 6–20)
CO2: 27 mmol/L (ref 22–32)
CREATININE: 0.59 mg/dL — AB (ref 0.61–1.24)
Calcium: 8.4 mg/dL — ABNORMAL LOW (ref 8.9–10.3)
Chloride: 105 mmol/L (ref 101–111)
GFR calc Af Amer: >60 mL/min (ref >60)
Glucose, Bld: 87 mg/dL (ref 65–99)
POTASSIUM: 3.4 mmol/L — AB (ref 3.5–5.1)
SODIUM: 139 mmol/L (ref 135–145)

## 2015-07-09 LAB — PATHOLOGIST SMEAR REVIEW

## 2015-07-09 LAB — CANCER ANTIGEN 19-9: CA 19 9: 20 U/mL (ref 0–35)

## 2015-07-09 MED ORDER — POLYETHYLENE GLYCOL 3350 17 G PO PACK: 17.0000 g | PACK | Freq: Every day | ORAL | Status: DC | PRN

## 2015-07-09 MED ORDER — HYDROMORPHONE HCL 2 MG PO TABS: 2.0000 mg | ORAL_TABLET | ORAL | Status: DC | PRN

## 2015-07-09 NOTE — Progress Notes (Signed)
IP PROGRESS NOTE  Subjective:   [name] is well-known to me with a history of non-Hodgkin's lymphoma. He was treated with R-CHOP 07/05/2015 and received Neulasta at home on 07/06/2015. He was admitted in early a.m. 07/07/2015 with pain at the upper abdomen and low anterior chest. A CT of the abdomen suggested a new mass near the pancreas. He was admitted for further evaluation. He reports the pain has resolved. No other complaint today.  Objective: Vital signs in last 24 hours: Blood pressure 101/62, pulse 79, temperature 98.6 F (37 C), temperature source Oral, resp. rate 16, height 6' (1.829 m), weight 140 lb 10.5 oz (63.8 kg), SpO2 96 %.  Intake/Output from previous day: 01/02 0701 - 01/03 0700 In: 290 [P.O.:240; IV Piggyback:50] Out: 3825 [Urine:1375]  Physical Exam:  HEENT: No thrush Lungs: Clear bilaterally Cardiac: Regular rate and rhythm Abdomen: No hepatomegaly, nontender, no mass Extremities: Trace edema at the right lower leg and ankle Neuro: Right foot drop with some movement on dorsi flexion at the right foot   Lab Results:  Recent Labs  07/08/15 0548 07/09/15 0445  WBC 65.3* 42.2*  HGB 10.3* 10.2*  HCT 32.2* 30.4*  PLT 205 134*    BMET  Recent Labs  07/08/15 0548 07/09/15 0445  NA 141 139  K 3.6 3.4*  CL 106 105  CO2 26 27  GLUCOSE 97 87  BUN 12 12  CREATININE 0.53* 0.59*  CALCIUM 8.5* 8.4*    Studies/Results: Dg Abd 1 View  07/07/2015  CLINICAL DATA:  75 year old male with acute abdominal pain and constipation. History of non-Hodgkin's lymphoma. EXAM: ABDOMEN - 1 VIEW COMPARISON:  05/14/2015 CT and prior exams FINDINGS: A small to moderate amount of stool within the colon and rectum noted. Nondistended gas-filled loops of small bowel are present. No acute bony abnormalities are noted. No suspicious calcifications are present. IMPRESSION: Nonspecific nonobstructive bowel gas pattern. Small to moderate amount of stool within the colon and  rectum. Electronically Signed   By: Margarette Canada M.D.   On: 07/07/2015 13:31   Ct Abdomen Pelvis W Contrast  07/08/2015  ADDENDUM REPORT: 07/08/2015 11:18 ADDENDUM: After reviewing this case with Dr. Kathlene Cote, who has previously aspirated the lesion at the pancreatic tail, it is felt that the current lesion adjacent to the pancreatic tail represents a hemorrhagic pancreatic pseudocyst and the multiple small adjacent nodules in the left upper quadrant represent small residual pancreatic pseudocysts. This is therefore felt to be unlikely to represent recurrent tumor. Electronically Signed   By: Lorriane Shire M.D.   On: 07/08/2015 11:18  07/08/2015  CLINICAL DATA:  Abdominal pain.  Non-Hodgkin's lymphoma. EXAM: CT ABDOMEN AND PELVIS WITH CONTRAST TECHNIQUE: Multidetector CT imaging of the abdomen and pelvis was performed using the standard protocol following bolus administration of intravenous contrast. CONTRAST:  144m OMNIPAQUE IOHEXOL 300 MG/ML  SOLN COMPARISON:  CT scan dated 05/14/2015 FINDINGS: Lower chest: There is chronic interstitial lung disease at the bases. Small blebs anteriorly at the right lung base. Heart size is normal. Hepatobiliary: Normal. Pancreas: The cystic lesion adjacent to the posterior aspect of the tail of the pancreas has enlarged and is now primarily solid measuring 9.5 x 6.7 cm. Multiple small peritoneal nodules in the left pericolic gutter have slightly increased in size. I suspect this is recurrent lymphoma. There is soft tissue stranding around these multiple lesions. The pancreatic parenchyma appears normal. Spleen: The spleen is been removed. Adrenals/Urinary Tract: Stable benign bilateral renal cysts. Ureters and bladder  are normal. Stomach/Bowel: There is soft tissue stranding around the posterior aspect of the fundus of the stomach. The mass in the left upper quadrant touches the posterior wall of the stomach. There is soft tissue stranding around the splenic flexure of the  colon but there is no visible invasion of the colon or obstruction of the bowel. The bowel otherwise appears normal including the terminal ileum and appendix. Vascular/Lymphatic: Aortic atherosclerosis.  No adenopathy. Reproductive: Normal. Other: Tiny amount of free fluid in the pelvis. Small amount of fluid in the left pericolic gutter. Musculoskeletal: No significant change in the destructive lesion in the right side of the sacrum. No other discrete osseous lesions. IMPRESSION: The cystic lesion adjacent to the tail of the pancreas is now an enlarged soft tissue mass lesion. The nodular soft tissue lesions in the left upper quadrant have also progressed. This is felt represent recurrent Hodgkin's lymphoma. Electronically Signed: By: Lorriane Shire M.D. On: 07/07/2015 16:45  CT images reviewed   Medications: I have reviewed the patient's current medications.  Assessment/Plan:  1. 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 History of thrombocytopenia predating chemotherapy 3. T7 compression fracture-likely pathologic 4. 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 Right foot pain-likely secondary to nerve compression from the sacral mass, improved 6. Diffuse erythematous rash, appearance consistent with a "drug"  rash-resolved 7. History of low-grade fever likely secondary to atelectasis/postop fever versus tumor fever 8. Hepatitis B core antibody positive 9. Altered mental status-likely secondary delirium in the setting of infection and hospitalization 10. Admission 04/18/2015 through 04/23/2015 presenting with fever; amylase and lipase elevated.   Abdominal CT showed inflammation in the left upper quadrant and associated loculated fluid collections status post drainage procedure, fluid with high levels of amylase and lipase; cultures negative; question pancreatitis.   04/26/2015 lipase mildly elevated, amylase normal.   CT abdomen/pelvis 05/14/2015 with a persistent 6.5 x 3.8 cm posterior pancreatic tail fluid collection similar in size to the 04/18/2015 CT study. Additional smaller fluid collections in the far lateral left upper quadrant; extensive patchy groundglass opacity at the lung bases, significantly increased; stable expansile lytic lesion in the upper right sacrum. 11. Bilateral lower extremity DVTs on a Doppler 04/18/2015, now on Lovenox. 12. Severe neutropenia 05/13/2015. Likely delayed effect of Rituxan. Resolved 13. Chest x-ray 05/15/2015 with bilateral mid to lower lung interstitial thickening with some hazy lower lung zone groundglass opacity. Partially improved on the chest x-ray 05/24/2015 14. New-onset rapid atrial fibrillation 05/29/2015 15. Admission with acute onset upper abdomen/low anterior chest pain 07/07/2015-most likely related to Neulasta  16. Marked leukocytosis secondary to Neulasta   Mr. Blahnik appears at baseline today. I suspect the acute onset pain was related to the Neulasta injection 07/06/2015. The fullness at the splenectomy/pancreas resection bed is most likely an evolving pseudocyst. Mr. Blanck appears stable for discharge. He should complete the current course of prednisone. He will begin Neurontin in an attempt to help the neuropathic  pain at the right  foot. He should return as scheduled for an office visit and chemotherapy 07/26/2015.     LOS: 2 days   Royetta Probus  07/09/2015, 8:34 AM

## 2015-07-09 NOTE — Discharge Summary (Signed)
Physician Discharge Summary  Perry Nielsen. [id number] DOB: 08-03-1940 DOA: 07/07/2015  PCP: Joycelyn Man, MD  Admit date: 07/07/2015 Discharge date: 07/09/2015  Time spent: 45 minutes  Recommendations for Outpatient Follow-up:  1. Dr.Sherril in 1 week   Discharge Diagnoses:    Non-Hodgkin's lymphoma   Abdominal pain   Pancreatic pseudocyst   Leukocytosis   Pancreatic mass   Leukocytosis   Bilateral DVTs  Discharge Condition: stable  Diet recommendation: regular  Filed Weights   07/07/15 1827 07/07/15 1948  Weight: 65.2 kg (143 lb 11.8 oz) 63.8 kg (140 lb 10.5 oz)    History of present illness:  Chief Complaint: abd pain HPI: Perry Nielsen. is a 75 y.o. 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 and this change in the cyst was likely related to some proteinaceous secretions, he called having aspirated his pseudocyst in past too, Ca 19-9 was normal -IR didn't feel that this was a solid mass or needed biopsy at this time, d/w Dr.Sherril too who felt that it was likley an evolving pseudocyst and  observation recommended at this time  Significant leukocytosis: -No s/s of infection -related to neulasta/prednisone, WBc improving, 73K->65K ->42K  Non hodgkins Lymphoma  -with spine mets finished XRT in 03/2015, on chemo with RCHOP, last on 12/30 -s/p Neulasta 12/31  Chronic afib: rate controlled without meds, on xarelto  H/o bilateral lower extremity DVT - on xarelto, was changed to lovenox while inpatient, incase biopsy was needed, Xarelto resumed  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 nerve compression from the right sacral mass -continue gabapentin  Cancer pain: prn dilaudid  Consultations:  D/w IR Dr.Yamagata  Onc Dr.Sherril  Discharge Exam: Filed Vitals:   07/08/15 2146 07/09/15 0457  BP: 124/77 101/62  Pulse: 87 79  Temp: 99.5 F (37.5 C) 98.6 F (37 C)  Resp: 18 16    General: AAOx3 Cardiovascular: S1S2/RRR Respiratory: CTAB  Discharge Instructions   Discharge Instructions    Diet - low sodium heart healthy    Complete by:  As directed      Increase activity slowly    Complete by:  As directed           Discharge Medication List as of 07/09/2015  9:51 AM    START taking these medications   Details  polyethylene glycol (MIRALAX / GLYCOLAX) packet Take 17 g by mouth daily as needed., Starting 07/09/2015, Until Discontinued, Normal      CONTINUE these medications which have NOT CHANGED   Details  famotidine (PEPCID) 20 MG tablet Take 20 mg by mouth daily. , Until Discontinued, Historical Med  feeding supplement, ENSURE ENLIVE, (ENSURE ENLIVE) LIQD Take 237 mLs by mouth 2 (two) times daily between meals., Starting 03/27/2015, Until Discontinued, Normal    fluticasone (FLONASE) 50 MCG/ACT nasal spray Place 1 spray into both nostrils daily as needed for allergies. , Until Discontinued, Historical Med    gabapentin (NEURONTIN) 300 MG capsule Take 1 capsule (300 mg total) by mouth 2 (two) times daily., Starting 07/05/2015, Until  Discontinued, Normal    ibuprofen (ADVIL,MOTRIN) 200 MG tablet Take 400 mg by mouth every 4 (four) hours as needed for fever, headache, mild pain, moderate pain or cramping., Until Discontinued, Historical Med    loperamide (IMODIUM A-D) 2 MG tablet Take 2 mg by mouth 4 (four) times daily as needed for diarrhea or loose stools., Until Discontinued, Historical Med    LORazepam (ATIVAN) 0.5 MG tablet Take 1 tablet (0.5 mg total) by mouth 2 (two) times daily as needed for anxiety., Starting 04/16/2015, Until Discontinued, Print    megestrol (MEGACE) 40 MG/ML suspension Take 5 mLs by mouth daily., Starting 07/04/2015, Until Discontinued, Historical Med    oxymetazoline (AFRIN) 0.05 % nasal spray Place 1 spray into both nostrils 2 (two) times daily as needed for congestion., Until Discontinued, Historical Med    Polyethyl Glycol-Propyl Glycol (SYSTANE OP) Apply 1 drop to eye daily as needed (dryness)., Until Discontinued, Historical Med    predniSONE (DELTASONE) 20 MG tablet TAKE 4 TABLETS(80 MG) BY MOUTH DAILY FOR 5 DAYS, Normal    prochlorperazine (COMPAZINE) 5 MG tablet TAKE 1 TO 2 TABLETS(5 TO 10 MG) BY MOUTH EVERY 6 HOURS AS NEEDED FOR NAUSEA OR VOMITING, Normal    rivaroxaban (XARELTO) 20 MG TABS tablet Take 1 tablet (20 mg total) by mouth daily with supper., Starting 06/11/2015, Until Discontinued, Print    HYDROmorphone (DILAUDID) 2 MG tablet Take 1 tablet (2 mg total) by mouth every 4 (four) hours as needed for severe pain., Starting 06/21/2015, Until Discontinued, Print       Allergies  Allergen Reactions  . Antihistamines, Diphenhydramine-Type     CAUSES PROSTATE TO SWELL & as a child had hyperactivity when took Benadryl Pt took Pepcid w/ Benadryl PO and did fine per Pt & his wife   Follow-up Information    Follow up with Betsy Coder, MD In 1 week.   Specialty:  Oncology   Contact information:   [place] [address] [phone]       Follow up with  [facility].   Why:  HHPT   Contact information:   [address] [place] [address] [phone]        The results of significant diagnostics from this hospitalization (including imaging, microbiology, ancillary and laboratory) are listed below for reference.    Significant Diagnostic Studies: Dg Chest 2 View  06/11/2015  CLINICAL DATA:  Followup large cell B lymphoma, history of atrial fibrillation EXAM: CHEST  2 VIEW COMPARISON:  Portable chest x-ray of 05/29/2015 FINDINGS: Slight prominence of interstitial markings remains most likely chronic in nature. No definite active infiltrate or effusion is seen. Mediastinal and hilar contours are unremarkable. Cardiomegaly is stable. A compressed mid thoracic vertebral body is unchanged. IMPRESSION: Stable chronic interstitial change. No active lung disease. Stable cardiomegaly. Electronically Signed   By: Ivar Drape M.D.   On: 06/11/2015 12:13   Dg Abd 1 View  07/07/2015  CLINICAL DATA:  75 year old male with acute abdominal pain and constipation. History of non-Hodgkin's lymphoma. EXAM: ABDOMEN - 1  VIEW COMPARISON:  05/14/2015 CT and prior exams FINDINGS: A small to moderate amount of stool within the colon and rectum noted. Nondistended gas-filled loops of small bowel are present. No acute bony abnormalities are noted. No suspicious calcifications are present. IMPRESSION: Nonspecific nonobstructive bowel gas pattern. Small to moderate amount of stool within the colon and rectum. Electronically Signed   By: Margarette Canada M.D.   On: 07/07/2015 13:31   Ct Abdomen Pelvis W Contrast  07/08/2015  ADDENDUM REPORT: 07/08/2015 11:18 ADDENDUM: After reviewing this case with Dr. Kathlene Cote, who has previously aspirated the lesion at the pancreatic tail, it is felt that the current lesion adjacent to the pancreatic tail represents a hemorrhagic pancreatic pseudocyst and the multiple small adjacent nodules in the left upper quadrant  represent small residual pancreatic pseudocysts. This is therefore felt to be unlikely to represent recurrent tumor. Electronically Signed   By: Lorriane Shire M.D.   On: 07/08/2015 11:18  07/08/2015  CLINICAL DATA:  Abdominal pain.  Non-Hodgkin's lymphoma. EXAM: CT ABDOMEN AND PELVIS WITH CONTRAST TECHNIQUE: Multidetector CT imaging of the abdomen and pelvis was performed using the standard protocol following bolus administration of intravenous contrast. CONTRAST:  150mL OMNIPAQUE IOHEXOL 300 MG/ML  SOLN COMPARISON:  CT scan dated 05/14/2015 FINDINGS: Lower chest: There is chronic interstitial lung disease at the bases. Small blebs anteriorly at the right lung base. Heart size is normal. Hepatobiliary: Normal. Pancreas: The cystic lesion adjacent to the posterior aspect of the tail of the pancreas has enlarged and is now primarily solid measuring 9.5 x 6.7 cm. Multiple small peritoneal nodules in the left pericolic gutter have slightly increased in size. I suspect this is recurrent lymphoma. There is soft tissue stranding around these multiple lesions. The pancreatic parenchyma appears normal. Spleen: The spleen is been removed. Adrenals/Urinary Tract: Stable benign bilateral renal cysts. Ureters and bladder are normal. Stomach/Bowel: There is soft tissue stranding around the posterior aspect of the fundus of the stomach. The mass in the left upper quadrant touches the posterior wall of the stomach. There is soft tissue stranding around the splenic flexure of the colon but there is no visible invasion of the colon or obstruction of the bowel. The bowel otherwise appears normal including the terminal ileum and appendix. Vascular/Lymphatic: Aortic atherosclerosis.  No adenopathy. Reproductive: Normal. Other: Tiny amount of free fluid in the pelvis. Small amount of fluid in the left pericolic gutter. Musculoskeletal: No significant change in the destructive lesion in the right side of the sacrum. No other discrete  osseous lesions. IMPRESSION: The cystic lesion adjacent to the tail of the pancreas is now an enlarged soft tissue mass lesion. The nodular soft tissue lesions in the left upper quadrant have also progressed. This is felt represent recurrent Hodgkin's lymphoma. Electronically Signed: By: Lorriane Shire M.D. On: 07/07/2015 16:45    Microbiology: No results found for this or any previous visit (from the past 240 hour(s)).   Labs: Basic Metabolic Panel:  Recent Labs Lab 07/05/15 0946 07/07/15 1304 07/08/15 0548 07/09/15 0445  NA 140 139 141 139  K 3.8 3.4* 3.6 3.4*  CL  --  105 106 105  CO2 24 24 26 27   GLUCOSE 98 103* 97 87  BUN 13.0 15 12 12   CREATININE 0.8 0.69 0.53* 0.59*  CALCIUM 9.3 9.1 8.5* 8.4*  PHOS  --   --  4.4  --    Liver Function Tests:  Recent Labs Lab 07/05/15 0946 07/07/15 1304 07/08/15 0548  AST  19 27 23   ALT 17 19 12*  ALKPHOS 60 57 64  BILITOT <0.30 0.5 0.6  PROT 6.2* 5.8* 4.9*  ALBUMIN 3.0* 3.1* 2.7*    Recent Labs Lab 07/07/15 1304 07/08/15 0548  LIPASE 214* 36   No results for input(s): AMMONIA in the last 168 hours. CBC:  Recent Labs Lab 07/05/15 0946 07/07/15 1304 07/08/15 0548 07/09/15 0445  WBC 16.2* 73.8* 65.3* 42.2*  NEUTROABS 12.5* 70.8* 63.0*  --   HGB 13.0 11.9* 10.3* 10.2*  HCT 40.8 37.3* 32.2* 30.4*  MCV 93.0 93.3 92.0 92.1  PLT Clumped Platelets--Appears Adequate 260 205 134*   Cardiac Enzymes: No results for input(s): CKTOTAL, CKMB, CKMBINDEX, TROPONINI in the last 168 hours. BNP: BNP (last 3 results) No results for input(s): BNP in the last 8760 hours.  ProBNP (last 3 results) No results for input(s): PROBNP in the last 8760 hours.  CBG: No results for input(s): GLUCAP in the last 168 hours.     SignedDomenic Polite MD  FACP  [facility] 07/09/2015, 3:16 PM

## 2015-07-09 NOTE — Care Management Note (Signed)
Case Management Note  Patient Details  Name: Perry Nielsen. MRN: [id number] Date of Birth: [birth year]  Subjective/Objective:    PT-recc HHPT. [facility] chosen by patient.TC [facility] rep Santiago Glad aware of d/c & HHPT order.                Action/Plan:d/c home w/HHC.   Expected Discharge Date:                 Expected Discharge Plan:  [place]  In-House Referral:     Discharge planning Services  CM Consult  Post Acute Care Choice:    Choice offered to:  Patient  DME Arranged:    DME Agency:     HH Arranged:  PT [place]:  [place]  Status of Service:  Completed, signed off  Medicare Important Message Given:    Date Medicare IM Given:    Medicare IM give by:    Date Additional Medicare IM Given:    Additional Medicare Important Message give by:     If discussed at [place] of Stay Meetings, dates discussed:    Additional Comments:  [name], RN 07/09/2015, 9:37 AM

## 2015-07-10 DIAGNOSIS — M21371 Foot drop, right foot: Secondary | ICD-10-CM | POA: Diagnosis not present

## 2015-07-10 DIAGNOSIS — T814XXA Infection following a procedure, initial encounter: Secondary | ICD-10-CM | POA: Diagnosis not present

## 2015-07-10 DIAGNOSIS — C8380 Other non-follicular lymphoma, unspecified site: Secondary | ICD-10-CM | POA: Diagnosis not present

## 2015-07-10 DIAGNOSIS — D72829 Elevated white blood cell count, unspecified: Secondary | ICD-10-CM | POA: Diagnosis not present

## 2015-07-10 DIAGNOSIS — G9340 Encephalopathy, unspecified: Secondary | ICD-10-CM | POA: Diagnosis not present

## 2015-07-10 DIAGNOSIS — L89302 Pressure ulcer of unspecified buttock, stage 2: Secondary | ICD-10-CM | POA: Diagnosis not present

## 2015-07-10 DIAGNOSIS — E440 Moderate protein-calorie malnutrition: Secondary | ICD-10-CM | POA: Diagnosis not present

## 2015-07-10 DIAGNOSIS — I8291 Chronic embolism and thrombosis of unspecified vein: Secondary | ICD-10-CM | POA: Diagnosis not present

## 2015-07-10 DIAGNOSIS — A419 Sepsis, unspecified organism: Secondary | ICD-10-CM | POA: Diagnosis not present

## 2015-07-12 DIAGNOSIS — E440 Moderate protein-calorie malnutrition: Secondary | ICD-10-CM | POA: Diagnosis not present

## 2015-07-12 DIAGNOSIS — M21371 Foot drop, right foot: Secondary | ICD-10-CM | POA: Diagnosis not present

## 2015-07-12 DIAGNOSIS — A419 Sepsis, unspecified organism: Secondary | ICD-10-CM | POA: Diagnosis not present

## 2015-07-12 DIAGNOSIS — T814XXA Infection following a procedure, initial encounter: Secondary | ICD-10-CM | POA: Diagnosis not present

## 2015-07-12 DIAGNOSIS — C8380 Other non-follicular lymphoma, unspecified site: Secondary | ICD-10-CM | POA: Diagnosis not present

## 2015-07-12 DIAGNOSIS — D72829 Elevated white blood cell count, unspecified: Secondary | ICD-10-CM | POA: Diagnosis not present

## 2015-07-12 DIAGNOSIS — G9340 Encephalopathy, unspecified: Secondary | ICD-10-CM | POA: Diagnosis not present

## 2015-07-12 DIAGNOSIS — L89302 Pressure ulcer of unspecified buttock, stage 2: Secondary | ICD-10-CM | POA: Diagnosis not present

## 2015-07-12 DIAGNOSIS — I8291 Chronic embolism and thrombosis of unspecified vein: Secondary | ICD-10-CM | POA: Diagnosis not present

## 2015-07-16 DIAGNOSIS — D72829 Elevated white blood cell count, unspecified: Secondary | ICD-10-CM | POA: Diagnosis not present

## 2015-07-16 DIAGNOSIS — M21371 Foot drop, right foot: Secondary | ICD-10-CM | POA: Diagnosis not present

## 2015-07-16 DIAGNOSIS — L89302 Pressure ulcer of unspecified buttock, stage 2: Secondary | ICD-10-CM | POA: Diagnosis not present

## 2015-07-16 DIAGNOSIS — E440 Moderate protein-calorie malnutrition: Secondary | ICD-10-CM | POA: Diagnosis not present

## 2015-07-16 DIAGNOSIS — G9340 Encephalopathy, unspecified: Secondary | ICD-10-CM | POA: Diagnosis not present

## 2015-07-16 DIAGNOSIS — C8380 Other non-follicular lymphoma, unspecified site: Secondary | ICD-10-CM | POA: Diagnosis not present

## 2015-07-16 DIAGNOSIS — A419 Sepsis, unspecified organism: Secondary | ICD-10-CM | POA: Diagnosis not present

## 2015-07-16 DIAGNOSIS — T814XXA Infection following a procedure, initial encounter: Secondary | ICD-10-CM | POA: Diagnosis not present

## 2015-07-16 DIAGNOSIS — I8291 Chronic embolism and thrombosis of unspecified vein: Secondary | ICD-10-CM | POA: Diagnosis not present

## 2015-07-17 DIAGNOSIS — M21379 Foot drop, unspecified foot: Secondary | ICD-10-CM | POA: Diagnosis not present

## 2015-07-17 DIAGNOSIS — C412 Malignant neoplasm of vertebral column: Secondary | ICD-10-CM | POA: Diagnosis not present

## 2015-07-17 DIAGNOSIS — E440 Moderate protein-calorie malnutrition: Secondary | ICD-10-CM | POA: Diagnosis not present

## 2015-07-19 DIAGNOSIS — C8380 Other non-follicular lymphoma, unspecified site: Secondary | ICD-10-CM | POA: Diagnosis not present

## 2015-07-19 DIAGNOSIS — I8291 Chronic embolism and thrombosis of unspecified vein: Secondary | ICD-10-CM | POA: Diagnosis not present

## 2015-07-19 DIAGNOSIS — D72829 Elevated white blood cell count, unspecified: Secondary | ICD-10-CM | POA: Diagnosis not present

## 2015-07-19 DIAGNOSIS — A419 Sepsis, unspecified organism: Secondary | ICD-10-CM | POA: Diagnosis not present

## 2015-07-19 DIAGNOSIS — M21371 Foot drop, right foot: Secondary | ICD-10-CM | POA: Diagnosis not present

## 2015-07-19 DIAGNOSIS — G9340 Encephalopathy, unspecified: Secondary | ICD-10-CM | POA: Diagnosis not present

## 2015-07-19 DIAGNOSIS — E440 Moderate protein-calorie malnutrition: Secondary | ICD-10-CM | POA: Diagnosis not present

## 2015-07-19 DIAGNOSIS — L89302 Pressure ulcer of unspecified buttock, stage 2: Secondary | ICD-10-CM | POA: Diagnosis not present

## 2015-07-19 DIAGNOSIS — T814XXA Infection following a procedure, initial encounter: Secondary | ICD-10-CM | POA: Diagnosis not present

## 2015-07-21 ENCOUNTER — Other Ambulatory Visit: Payer: Self-pay | Admitting: Oncology

## 2015-07-23 ENCOUNTER — Other Ambulatory Visit: Payer: Self-pay | Admitting: Oncology

## 2015-07-25 DIAGNOSIS — C8380 Other non-follicular lymphoma, unspecified site: Secondary | ICD-10-CM | POA: Diagnosis not present

## 2015-07-25 DIAGNOSIS — A419 Sepsis, unspecified organism: Secondary | ICD-10-CM | POA: Diagnosis not present

## 2015-07-25 DIAGNOSIS — E440 Moderate protein-calorie malnutrition: Secondary | ICD-10-CM | POA: Diagnosis not present

## 2015-07-25 DIAGNOSIS — I8291 Chronic embolism and thrombosis of unspecified vein: Secondary | ICD-10-CM | POA: Diagnosis not present

## 2015-07-25 DIAGNOSIS — T814XXA Infection following a procedure, initial encounter: Secondary | ICD-10-CM | POA: Diagnosis not present

## 2015-07-25 DIAGNOSIS — L89302 Pressure ulcer of unspecified buttock, stage 2: Secondary | ICD-10-CM | POA: Diagnosis not present

## 2015-07-25 DIAGNOSIS — D72829 Elevated white blood cell count, unspecified: Secondary | ICD-10-CM | POA: Diagnosis not present

## 2015-07-25 DIAGNOSIS — G9340 Encephalopathy, unspecified: Secondary | ICD-10-CM | POA: Diagnosis not present

## 2015-07-25 DIAGNOSIS — M21371 Foot drop, right foot: Secondary | ICD-10-CM | POA: Diagnosis not present

## 2015-07-26 ENCOUNTER — Other Ambulatory Visit (HOSPITAL_BASED_OUTPATIENT_CLINIC_OR_DEPARTMENT_OTHER): Payer: Medicare Other

## 2015-07-26 ENCOUNTER — Ambulatory Visit (HOSPITAL_BASED_OUTPATIENT_CLINIC_OR_DEPARTMENT_OTHER): Payer: Medicare Other

## 2015-07-26 ENCOUNTER — Telehealth: Payer: Self-pay | Admitting: Oncology

## 2015-07-26 ENCOUNTER — Ambulatory Visit (HOSPITAL_BASED_OUTPATIENT_CLINIC_OR_DEPARTMENT_OTHER): Payer: Medicare Other | Admitting: Oncology

## 2015-07-26 VITALS — BP 109/71 | HR 76 | Temp 98.7°F | Resp 18

## 2015-07-26 VITALS — BP 110/63 | HR 90 | Temp 97.5°F | Resp 18 | Ht 72.0 in | Wt 149.4 lb

## 2015-07-26 DIAGNOSIS — I82403 Acute embolism and thrombosis of unspecified deep veins of lower extremity, bilateral: Secondary | ICD-10-CM | POA: Diagnosis not present

## 2015-07-26 DIAGNOSIS — I4891 Unspecified atrial fibrillation: Secondary | ICD-10-CM | POA: Diagnosis not present

## 2015-07-26 DIAGNOSIS — Z5112 Encounter for antineoplastic immunotherapy: Secondary | ICD-10-CM

## 2015-07-26 DIAGNOSIS — C8580 Other specified types of non-Hodgkin lymphoma, unspecified site: Secondary | ICD-10-CM

## 2015-07-26 DIAGNOSIS — M79671 Pain in right foot: Secondary | ICD-10-CM | POA: Diagnosis not present

## 2015-07-26 DIAGNOSIS — Z5111 Encounter for antineoplastic chemotherapy: Secondary | ICD-10-CM

## 2015-07-26 LAB — COMPREHENSIVE METABOLIC PANEL
ALBUMIN: 2.8 g/dL — AB (ref 3.5–5.0)
ALK PHOS: 65 U/L (ref 40–150)
ALT: 20 U/L (ref 0–55)
AST: 22 U/L (ref 5–34)
Anion Gap: 9 mEq/L (ref 3–11)
BUN: 13.4 mg/dL (ref 7.0–26.0)
CALCIUM: 8.9 mg/dL (ref 8.4–10.4)
CO2: 23 mEq/L (ref 22–29)
CREATININE: 0.7 mg/dL (ref 0.7–1.3)
Chloride: 106 mEq/L (ref 98–109)
EGFR: >90 mL/min/{1.73_m2} (ref >90)
Glucose: 123 mg/dl (ref 70–140)
POTASSIUM: 3.6 meq/L (ref 3.5–5.1)
Sodium: 138 mEq/L (ref 136–145)
Total Bilirubin: <0.3 mg/dL (ref 0.20–1.20)
Total Protein: 5.8 g/dL — ABNORMAL LOW (ref 6.4–8.3)

## 2015-07-26 LAB — CBC WITH DIFFERENTIAL/PLATELET
BASO%: 0.4 % (ref 0.0–2.0)
BASOS ABS: 0.1 10*3/uL (ref 0.0–0.1)
EOS%: 0.1 % (ref 0.0–7.0)
Eosinophils Absolute: 0 10*3/uL (ref 0.0–0.5)
HEMATOCRIT: 36 % — AB (ref 38.4–49.9)
HEMOGLOBIN: 12 g/dL — AB (ref 13.0–17.1)
LYMPH#: 1 10*3/uL (ref 0.9–3.3)
LYMPH%: 7.9 % — ABNORMAL LOW (ref 14.0–49.0)
MCH: 30.4 pg (ref 27.2–33.4)
MCHC: 33.3 g/dL (ref 32.0–36.0)
MCV: 91.1 fL (ref 79.3–98.0)
MONO#: 1.9 10*3/uL — AB (ref 0.1–0.9)
MONO%: 14.6 % — ABNORMAL HIGH (ref 0.0–14.0)
NEUT#: 10.1 10*3/uL — ABNORMAL HIGH (ref 1.5–6.5)
NEUT%: 77 % — ABNORMAL HIGH (ref 39.0–75.0)
Platelets: 312 10*3/uL (ref 140–400)
RBC: 3.95 10*6/uL — ABNORMAL LOW (ref 4.20–5.82)
RDW: 16.9 % — AB (ref 11.0–14.6)
WBC: 13.1 10*3/uL — ABNORMAL HIGH (ref 4.0–10.3)

## 2015-07-26 LAB — LACTATE DEHYDROGENASE: LDH: 307 U/L — AB (ref 125–245)

## 2015-07-26 MED ORDER — SODIUM CHLORIDE 0.9 % IV SOLN
Freq: Once | INTRAVENOUS | Status: AC
  Administered 2015-07-26: 12:00:00 via INTRAVENOUS

## 2015-07-26 MED ORDER — SODIUM CHLORIDE 0.9 % IV SOLN
Freq: Once | INTRAVENOUS | Status: AC
  Administered 2015-07-26: 12:00:00 via INTRAVENOUS
  Filled 2015-07-26: qty 8

## 2015-07-26 MED ORDER — HYDROMORPHONE HCL 2 MG PO TABS
2.0000 mg | ORAL_TABLET | ORAL | Status: DC | PRN
Start: 2015-07-26 — End: 2015-08-06

## 2015-07-26 MED ORDER — ACETAMINOPHEN 325 MG PO TABS
650.0000 mg | ORAL_TABLET | Freq: Once | ORAL | Status: AC
  Administered 2015-07-26: 650 mg via ORAL

## 2015-07-26 MED ORDER — ACETAMINOPHEN 325 MG PO TABS
ORAL_TABLET | ORAL | Status: AC
  Filled 2015-07-26: qty 2

## 2015-07-26 MED ORDER — FAMOTIDINE 20 MG PO TABS
20.0000 mg | ORAL_TABLET | Freq: Once | ORAL | Status: AC
  Administered 2015-07-26: 20 mg via ORAL

## 2015-07-26 MED ORDER — DIPHENHYDRAMINE HCL 25 MG PO CAPS
25.0000 mg | ORAL_CAPSULE | Freq: Once | ORAL | Status: AC
  Administered 2015-07-26: 25 mg via ORAL

## 2015-07-26 MED ORDER — DIPHENHYDRAMINE HCL 25 MG PO CAPS
ORAL_CAPSULE | ORAL | Status: AC
  Filled 2015-07-26: qty 1

## 2015-07-26 MED ORDER — DOXORUBICIN HCL CHEMO IV INJECTION 2 MG/ML
48.0000 mg/m2 | Freq: Once | INTRAVENOUS | Status: AC
  Administered 2015-07-26: 88 mg via INTRAVENOUS
  Filled 2015-07-26: qty 44

## 2015-07-26 MED ORDER — SODIUM CHLORIDE 0.9 % IV SOLN
375.0000 mg/m2 | Freq: Once | INTRAVENOUS | Status: AC
  Administered 2015-07-26: 700 mg via INTRAVENOUS
  Filled 2015-07-26: qty 70

## 2015-07-26 MED ORDER — FAMOTIDINE 20 MG PO TABS
ORAL_TABLET | ORAL | Status: AC
  Filled 2015-07-26: qty 1

## 2015-07-26 MED ORDER — VINCRISTINE SULFATE CHEMO INJECTION 1 MG/ML
2.0000 mg | Freq: Once | INTRAVENOUS | Status: AC
  Administered 2015-07-26: 2 mg via INTRAVENOUS
  Filled 2015-07-26: qty 2

## 2015-07-26 MED ORDER — SODIUM CHLORIDE 0.9 % IV SOLN
720.0000 mg/m2 | Freq: Once | INTRAVENOUS | Status: AC
  Administered 2015-07-26: 1320 mg via INTRAVENOUS
  Filled 2015-07-26: qty 66

## 2015-07-26 NOTE — Telephone Encounter (Signed)
perp of to sch pt appt-gave pt copy of avs-sent MW/Melissa email to sch trmt-pt aware

## 2015-07-26 NOTE — Patient Instructions (Signed)
[facility] Discharge Instructions for Patients Receiving Chemotherapy  Today you received the following chemotherapy agents: Rituxan, Doxyrubicin, vincristine and cytoxan  To help prevent nausea and vomiting after your treatment, we encourage you to take your nausea medication: Compazine 5 mg every 6 hours as needed.   If you develop nausea and vomiting that is not controlled by your nausea medication, call the clinic.   BELOW ARE SYMPTOMS THAT SHOULD BE REPORTED IMMEDIATELY:  *FEVER GREATER THAN 100.5 F  *CHILLS WITH OR WITHOUT FEVER  NAUSEA AND VOMITING THAT IS NOT CONTROLLED WITH YOUR NAUSEA MEDICATION  *UNUSUAL SHORTNESS OF BREATH  *UNUSUAL BRUISING OR BLEEDING  TENDERNESS IN MOUTH AND THROAT WITH OR WITHOUT PRESENCE OF ULCERS  *URINARY PROBLEMS  *BOWEL PROBLEMS  UNUSUAL RASH Items with * indicate a potential emergency and should be followed up as soon as possible.  Feel free to call the clinic you have any questions or concerns. The clinic phone number is [phone].  Please show the [place] at check-in to the Emergency Department and triage nurse.

## 2015-07-26 NOTE — Progress Notes (Signed)
[facility] OFFICE PROGRESS NOTE   Diagnosis:  Non-Hodgkin's lymphoma  INTERVAL HISTORY:    Perry Nielsen returns as scheduled. He completed another treatment with CHOP-rituximab on 07/05/2015. He developed acute abdominal pain several hours after the home Neulasta injection on  07/07/2015. He was admitted [facility] the pain resolved spontaneously.   He denies recurrent abdominal pain. He continues to have discomfort in the right foot. The pain is constant. He takes Dilaudid at night. He has a good appetite. He ambulates with a walker and participated in physical therapy.  Objective:  Vital signs in last 24 hours:  Blood pressure 110/63, pulse 90, temperature 97.5 F (36.4 C), temperature source Oral, resp. rate 18, height 6' (1.829 m), weight 149 lb 6.4 oz (67.767 kg), SpO2 97 %.    HEENT:  No thrush or ulcers Resp:  Lungs clear bilaterally Cardio:  Regular rate and rhythm GI:  No hepatomegaly, nontender Vascular:  Trace pitting edema at the left lower leg, once 2+ edema at the right lower leg and foot Neuro: right foot drop, the right leg and foot strength are otherwise intact , ambulates      Lab Results:  Lab Results  Component Value Date   WBC 13.1* 07/26/2015   HGB 12.0* 07/26/2015   HCT 36.0* 07/26/2015   MCV 91.1 07/26/2015   PLT 312 07/26/2015   NEUTROABS 10.1* 07/26/2015     Medications: I have reviewed the patient's current medications.  Assessment/Plan: 1. 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 History of thrombocytopenia predating chemotherapy 3. T7 compression fracture-likely pathologic 4. 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 Right foot pain-likely secondary to nerve compression from the sacral mass, improved 6. Diffuse erythematous rash, appearance consistent with a "drug" rash-resolved 7. History of low-grade fever likely secondary to atelectasis/postop fever versus tumor fever 8. Hepatitis B core antibody positive 9. Altered mental status-likely secondary delirium in the setting of infection and hospitalization 10. Admission 04/18/2015 through 04/23/2015 presenting with fever; amylase and lipase elevated.   Abdominal CT showed inflammation in the left upper quadrant and associated loculated fluid collections status post drainage procedure, fluid with high levels of amylase and lipase; cultures negative; question pancreatitis.   04/26/2015 lipase mildly elevated, amylase normal.   CT abdomen/pelvis 05/14/2015 with a persistent 6.5 x 3.8 cm posterior pancreatic tail fluid collection similar in size to the 04/18/2015 CT study. Additional smaller fluid collections in the far lateral left upper quadrant; extensive patchy groundglass opacity at the lung bases, significantly increased; stable expansile lytic lesion in the upper right sacrum. 11. Bilateral lower extremity DVTs on a Doppler 04/18/2015, now on Lovenox. 12. Severe neutropenia 05/13/2015. Likely delayed effect of Rituxan. Resolved 13. Chest x-ray 05/15/2015 with bilateral mid to lower lung interstitial thickening with some hazy lower lung zone groundglass opacity. Partially improved on the chest x-ray 05/24/2015 14. New-onset rapid  atrial fibrillation 05/29/2015 15. Admission with  acute onset upper abdomen/low anterior chest pain 07/07/2015-most likely related to Neulasta , , spontaneously resolved     Disposition:   Perry Nielsen appears stable. The plan is to proceed with cycle 5 CHOP/rituximab today. We will hold Neulasta with this cycle secondary to the severe pain he experienced following cycle 4. He will return for a nadir CBC in approximately 10 days.   Neurontin has not helped the right foot pain. He will begin a trial of Lyrica. I recommended he elevate the foot and try a support stocking. He will continue physical therapy.   Mr. Jacqulyn Liner will return for an office visit Ann cycle 6 chemotherapy on 08/16/2015.  Betsy Coder, MD  07/26/2015  10:27 AM

## 2015-07-27 LAB — HEPATITIS B SURFACE ANTIGEN: HEP B S AG: NEGATIVE

## 2015-07-29 ENCOUNTER — Other Ambulatory Visit: Payer: Self-pay | Admitting: *Deleted

## 2015-07-29 MED ORDER — PREGABALIN 75 MG PO CAPS: 75.0000 mg | ORAL_CAPSULE | Freq: Two times a day (BID) | ORAL | Status: DC

## 2015-07-29 NOTE — Telephone Encounter (Signed)
Notified pt and wife that Lyrica has been called in to pharmacy as requested.  Pt and wife verbalized understanding.

## 2015-08-01 DIAGNOSIS — D72829 Elevated white blood cell count, unspecified: Secondary | ICD-10-CM | POA: Diagnosis not present

## 2015-08-01 DIAGNOSIS — R269 Unspecified abnormalities of gait and mobility: Secondary | ICD-10-CM | POA: Diagnosis not present

## 2015-08-01 DIAGNOSIS — M21371 Foot drop, right foot: Secondary | ICD-10-CM | POA: Diagnosis not present

## 2015-08-01 DIAGNOSIS — M6281 Muscle weakness (generalized): Secondary | ICD-10-CM | POA: Diagnosis not present

## 2015-08-01 DIAGNOSIS — K869 Disease of pancreas, unspecified: Secondary | ICD-10-CM | POA: Diagnosis not present

## 2015-08-01 DIAGNOSIS — E440 Moderate protein-calorie malnutrition: Secondary | ICD-10-CM | POA: Diagnosis not present

## 2015-08-01 DIAGNOSIS — C7951 Secondary malignant neoplasm of bone: Secondary | ICD-10-CM | POA: Diagnosis not present

## 2015-08-01 DIAGNOSIS — C8380 Other non-follicular lymphoma, unspecified site: Secondary | ICD-10-CM | POA: Diagnosis not present

## 2015-08-02 ENCOUNTER — Telehealth: Payer: Self-pay | Admitting: *Deleted

## 2015-08-02 NOTE — Telephone Encounter (Signed)
Message from Perry Nielsen, PT requesting to re-certify pt for home health PT. Reviewed with Dr. Benay Spice. Verbal order given.

## 2015-08-05 ENCOUNTER — Other Ambulatory Visit (HOSPITAL_BASED_OUTPATIENT_CLINIC_OR_DEPARTMENT_OTHER): Payer: Medicare Other

## 2015-08-05 DIAGNOSIS — C8580 Other specified types of non-Hodgkin lymphoma, unspecified site: Secondary | ICD-10-CM | POA: Diagnosis not present

## 2015-08-05 LAB — CBC WITH DIFFERENTIAL/PLATELET
BASO%: 3.6 % — AB (ref 0.0–2.0)
Basophils Absolute: 0.1 10*3/uL (ref 0.0–0.1)
EOS%: 0.5 % (ref 0.0–7.0)
Eosinophils Absolute: 0 10*3/uL (ref 0.0–0.5)
HCT: 34.1 % — ABNORMAL LOW (ref 38.4–49.9)
HEMOGLOBIN: 11.5 g/dL — AB (ref 13.0–17.1)
LYMPH#: 0.6 10*3/uL — AB (ref 0.9–3.3)
LYMPH%: 31.6 % (ref 14.0–49.0)
MCH: 30.3 pg (ref 27.2–33.4)
MCHC: 33.7 g/dL (ref 32.0–36.0)
MCV: 90 fL (ref 79.3–98.0)
MONO#: 0.3 10*3/uL (ref 0.1–0.9)
MONO%: 13.5 % (ref 0.0–14.0)
NEUT%: 50.8 % (ref 39.0–75.0)
NEUTROS ABS: 1 10*3/uL — AB (ref 1.5–6.5)
NRBC: 0 % (ref 0–0)
PLATELETS: 172 10*3/uL (ref 140–400)
RBC: 3.79 10*6/uL — ABNORMAL LOW (ref 4.20–5.82)
RDW: 17.2 % — AB (ref 11.0–14.6)
WBC: 1.9 10*3/uL — AB (ref 4.0–10.3)

## 2015-08-06 ENCOUNTER — Telehealth: Payer: Self-pay

## 2015-08-06 ENCOUNTER — Telehealth: Payer: Self-pay | Admitting: *Deleted

## 2015-08-06 DIAGNOSIS — C8580 Other specified types of non-Hodgkin lymphoma, unspecified site: Secondary | ICD-10-CM

## 2015-08-06 MED ORDER — HYDROMORPHONE HCL 2 MG PO TABS
2.0000 mg | ORAL_TABLET | ORAL | Status: DC | PRN
Start: 2015-08-06 — End: 2015-08-16

## 2015-08-06 NOTE — Telephone Encounter (Signed)
-----   Message from Ladell Pier, MD sent at 08/05/2015  5:35 PM EST ----- Please call patient, call for fever, white count is low, follow-up as scheduled

## 2015-08-06 NOTE — Addendum Note (Signed)
Addended by: Rosalio Macadamia C on: 08/06/2015 12:26 PM   Modules accepted: Orders

## 2015-08-06 NOTE — Telephone Encounter (Signed)
Prescription will be left in Rx book for pick up.

## 2015-08-06 NOTE — Telephone Encounter (Signed)
Spoke with pt's wife, informed her of low WBC. Neutropenic precautions reviewed. Instructed her to call office for fever, shaking chills or other signs of infection. She voiced understanding.

## 2015-08-06 NOTE — Telephone Encounter (Signed)
Wife calling requesting dilaudid refill. They will run out Sunday. Please call home phone number when ready.

## 2015-08-07 DIAGNOSIS — C7951 Secondary malignant neoplasm of bone: Secondary | ICD-10-CM | POA: Diagnosis not present

## 2015-08-07 DIAGNOSIS — E440 Moderate protein-calorie malnutrition: Secondary | ICD-10-CM | POA: Diagnosis not present

## 2015-08-07 DIAGNOSIS — R269 Unspecified abnormalities of gait and mobility: Secondary | ICD-10-CM | POA: Diagnosis not present

## 2015-08-07 DIAGNOSIS — D72829 Elevated white blood cell count, unspecified: Secondary | ICD-10-CM | POA: Diagnosis not present

## 2015-08-07 DIAGNOSIS — M6281 Muscle weakness (generalized): Secondary | ICD-10-CM | POA: Diagnosis not present

## 2015-08-07 DIAGNOSIS — C8380 Other non-follicular lymphoma, unspecified site: Secondary | ICD-10-CM | POA: Diagnosis not present

## 2015-08-07 DIAGNOSIS — K869 Disease of pancreas, unspecified: Secondary | ICD-10-CM | POA: Diagnosis not present

## 2015-08-07 DIAGNOSIS — M21371 Foot drop, right foot: Secondary | ICD-10-CM | POA: Diagnosis not present

## 2015-08-08 DIAGNOSIS — C7951 Secondary malignant neoplasm of bone: Secondary | ICD-10-CM | POA: Diagnosis not present

## 2015-08-08 DIAGNOSIS — E440 Moderate protein-calorie malnutrition: Secondary | ICD-10-CM | POA: Diagnosis not present

## 2015-08-08 DIAGNOSIS — D72829 Elevated white blood cell count, unspecified: Secondary | ICD-10-CM | POA: Diagnosis not present

## 2015-08-08 DIAGNOSIS — C8380 Other non-follicular lymphoma, unspecified site: Secondary | ICD-10-CM | POA: Diagnosis not present

## 2015-08-08 DIAGNOSIS — M21371 Foot drop, right foot: Secondary | ICD-10-CM | POA: Diagnosis not present

## 2015-08-08 DIAGNOSIS — R269 Unspecified abnormalities of gait and mobility: Secondary | ICD-10-CM | POA: Diagnosis not present

## 2015-08-08 DIAGNOSIS — M6281 Muscle weakness (generalized): Secondary | ICD-10-CM | POA: Diagnosis not present

## 2015-08-08 DIAGNOSIS — K869 Disease of pancreas, unspecified: Secondary | ICD-10-CM | POA: Diagnosis not present

## 2015-08-11 ENCOUNTER — Other Ambulatory Visit: Payer: Self-pay | Admitting: Oncology

## 2015-08-12 ENCOUNTER — Other Ambulatory Visit: Payer: Self-pay | Admitting: Oncology

## 2015-08-12 DIAGNOSIS — M21371 Foot drop, right foot: Secondary | ICD-10-CM | POA: Diagnosis not present

## 2015-08-12 DIAGNOSIS — R269 Unspecified abnormalities of gait and mobility: Secondary | ICD-10-CM | POA: Diagnosis not present

## 2015-08-12 DIAGNOSIS — K869 Disease of pancreas, unspecified: Secondary | ICD-10-CM | POA: Diagnosis not present

## 2015-08-12 DIAGNOSIS — C7951 Secondary malignant neoplasm of bone: Secondary | ICD-10-CM | POA: Diagnosis not present

## 2015-08-12 DIAGNOSIS — C8380 Other non-follicular lymphoma, unspecified site: Secondary | ICD-10-CM | POA: Diagnosis not present

## 2015-08-12 DIAGNOSIS — D72829 Elevated white blood cell count, unspecified: Secondary | ICD-10-CM | POA: Diagnosis not present

## 2015-08-12 DIAGNOSIS — E440 Moderate protein-calorie malnutrition: Secondary | ICD-10-CM | POA: Diagnosis not present

## 2015-08-12 DIAGNOSIS — M6281 Muscle weakness (generalized): Secondary | ICD-10-CM | POA: Diagnosis not present

## 2015-08-15 DIAGNOSIS — M6281 Muscle weakness (generalized): Secondary | ICD-10-CM | POA: Diagnosis not present

## 2015-08-15 DIAGNOSIS — C8380 Other non-follicular lymphoma, unspecified site: Secondary | ICD-10-CM | POA: Diagnosis not present

## 2015-08-15 DIAGNOSIS — E440 Moderate protein-calorie malnutrition: Secondary | ICD-10-CM | POA: Diagnosis not present

## 2015-08-15 DIAGNOSIS — D72829 Elevated white blood cell count, unspecified: Secondary | ICD-10-CM | POA: Diagnosis not present

## 2015-08-15 DIAGNOSIS — M21371 Foot drop, right foot: Secondary | ICD-10-CM | POA: Diagnosis not present

## 2015-08-15 DIAGNOSIS — R269 Unspecified abnormalities of gait and mobility: Secondary | ICD-10-CM | POA: Diagnosis not present

## 2015-08-15 DIAGNOSIS — K869 Disease of pancreas, unspecified: Secondary | ICD-10-CM | POA: Diagnosis not present

## 2015-08-15 DIAGNOSIS — C7951 Secondary malignant neoplasm of bone: Secondary | ICD-10-CM | POA: Diagnosis not present

## 2015-08-16 ENCOUNTER — Ambulatory Visit (HOSPITAL_BASED_OUTPATIENT_CLINIC_OR_DEPARTMENT_OTHER): Payer: Medicare Other

## 2015-08-16 ENCOUNTER — Ambulatory Visit (HOSPITAL_BASED_OUTPATIENT_CLINIC_OR_DEPARTMENT_OTHER): Payer: Medicare Other | Admitting: Nurse Practitioner

## 2015-08-16 ENCOUNTER — Other Ambulatory Visit (HOSPITAL_BASED_OUTPATIENT_CLINIC_OR_DEPARTMENT_OTHER): Payer: Medicare Other

## 2015-08-16 VITALS — BP 98/75 | HR 71 | Temp 98.1°F | Resp 16

## 2015-08-16 VITALS — BP 114/88 | HR 90 | Temp 98.6°F | Resp 17 | Ht 72.0 in | Wt 153.1 lb

## 2015-08-16 DIAGNOSIS — C8580 Other specified types of non-Hodgkin lymphoma, unspecified site: Secondary | ICD-10-CM | POA: Diagnosis not present

## 2015-08-16 DIAGNOSIS — Z5112 Encounter for antineoplastic immunotherapy: Secondary | ICD-10-CM | POA: Diagnosis not present

## 2015-08-16 DIAGNOSIS — I82403 Acute embolism and thrombosis of unspecified deep veins of lower extremity, bilateral: Secondary | ICD-10-CM

## 2015-08-16 DIAGNOSIS — Z5111 Encounter for antineoplastic chemotherapy: Secondary | ICD-10-CM

## 2015-08-16 DIAGNOSIS — I4891 Unspecified atrial fibrillation: Secondary | ICD-10-CM

## 2015-08-16 DIAGNOSIS — M79604 Pain in right leg: Secondary | ICD-10-CM

## 2015-08-16 DIAGNOSIS — M792 Neuralgia and neuritis, unspecified: Secondary | ICD-10-CM

## 2015-08-16 DIAGNOSIS — Z23 Encounter for immunization: Secondary | ICD-10-CM

## 2015-08-16 LAB — CBC WITH DIFFERENTIAL/PLATELET
BASO%: 0.5 % (ref 0.0–2.0)
BASOS ABS: 0 10*3/uL (ref 0.0–0.1)
EOS ABS: 0 10*3/uL (ref 0.0–0.5)
EOS%: 0.2 % (ref 0.0–7.0)
HEMATOCRIT: 36.1 % — AB (ref 38.4–49.9)
HEMOGLOBIN: 12.1 g/dL — AB (ref 13.0–17.1)
LYMPH#: 1 10*3/uL (ref 0.9–3.3)
LYMPH%: 15 % (ref 14.0–49.0)
MCH: 30.6 pg (ref 27.2–33.4)
MCHC: 33.5 g/dL (ref 32.0–36.0)
MCV: 91.2 fL (ref 79.3–98.0)
MONO#: 1.3 10*3/uL — ABNORMAL HIGH (ref 0.1–0.9)
MONO%: 20.5 % — ABNORMAL HIGH (ref 0.0–14.0)
NEUT#: 4.1 10*3/uL (ref 1.5–6.5)
NEUT%: 63.8 % (ref 39.0–75.0)
Platelets: 198 10*3/uL (ref 140–400)
RBC: 3.96 10*6/uL — ABNORMAL LOW (ref 4.20–5.82)
RDW: 17.6 % — AB (ref 11.0–14.6)
WBC: 6.3 10*3/uL (ref 4.0–10.3)

## 2015-08-16 LAB — COMPREHENSIVE METABOLIC PANEL
ALBUMIN: 2.9 g/dL — AB (ref 3.5–5.0)
ALK PHOS: 67 U/L (ref 40–150)
ALT: 24 U/L (ref 0–55)
AST: 24 U/L (ref 5–34)
Anion Gap: 10 mEq/L (ref 3–11)
BUN: 10.9 mg/dL (ref 7.0–26.0)
CALCIUM: 9 mg/dL (ref 8.4–10.4)
CO2: 23 mEq/L (ref 22–29)
Chloride: 107 mEq/L (ref 98–109)
Creatinine: 0.7 mg/dL (ref 0.7–1.3)
Glucose: 85 mg/dl (ref 70–140)
POTASSIUM: 3.3 meq/L — AB (ref 3.5–5.1)
Sodium: 141 mEq/L (ref 136–145)
Total Bilirubin: <0.3 mg/dL (ref 0.20–1.20)
Total Protein: 6 g/dL — ABNORMAL LOW (ref 6.4–8.3)

## 2015-08-16 LAB — LACTATE DEHYDROGENASE: LDH: 249 U/L — AB (ref 125–245)

## 2015-08-16 MED ORDER — SODIUM CHLORIDE 0.9 % IV SOLN
720.0000 mg/m2 | Freq: Once | INTRAVENOUS | Status: AC
  Administered 2015-08-16: 1320 mg via INTRAVENOUS
  Filled 2015-08-16: qty 66

## 2015-08-16 MED ORDER — ACETAMINOPHEN 325 MG PO TABS
650.0000 mg | ORAL_TABLET | Freq: Once | ORAL | Status: AC
  Administered 2015-08-16: 650 mg via ORAL

## 2015-08-16 MED ORDER — FAMOTIDINE 20 MG PO TABS
20.0000 mg | ORAL_TABLET | Freq: Once | ORAL | Status: AC
  Administered 2015-08-16: 20 mg via ORAL

## 2015-08-16 MED ORDER — ACETAMINOPHEN 325 MG PO TABS
ORAL_TABLET | ORAL | Status: AC
  Filled 2015-08-16: qty 2

## 2015-08-16 MED ORDER — SODIUM CHLORIDE 0.9 % IV SOLN
375.0000 mg/m2 | Freq: Once | INTRAVENOUS | Status: AC
  Administered 2015-08-16: 700 mg via INTRAVENOUS
  Filled 2015-08-16: qty 60

## 2015-08-16 MED ORDER — FAMOTIDINE IN NACL 20-0.9 MG/50ML-% IV SOLN
INTRAVENOUS | Status: AC
  Filled 2015-08-16: qty 50

## 2015-08-16 MED ORDER — DIPHENHYDRAMINE HCL 25 MG PO CAPS
ORAL_CAPSULE | ORAL | Status: AC
  Filled 2015-08-16: qty 1

## 2015-08-16 MED ORDER — DULOXETINE HCL 20 MG PO CPEP: 20.0000 mg | ORAL_CAPSULE | Freq: Every day | ORAL | Status: DC

## 2015-08-16 MED ORDER — SODIUM CHLORIDE 0.9 % IV SOLN
Freq: Once | INTRAVENOUS | Status: AC
  Administered 2015-08-16: 14:00:00 via INTRAVENOUS

## 2015-08-16 MED ORDER — INFLUENZA VAC SPLIT QUAD 0.5 ML IM SUSY
0.5000 mL | PREFILLED_SYRINGE | Freq: Once | INTRAMUSCULAR | Status: AC
  Administered 2015-08-16: 0.5 mL via INTRAMUSCULAR

## 2015-08-16 MED ORDER — SODIUM CHLORIDE 0.9 % IV SOLN
Freq: Once | INTRAVENOUS | Status: AC
  Administered 2015-08-16: 14:00:00 via INTRAVENOUS
  Filled 2015-08-16: qty 8

## 2015-08-16 MED ORDER — FAMOTIDINE 20 MG PO TABS
ORAL_TABLET | ORAL | Status: AC
  Filled 2015-08-16: qty 1

## 2015-08-16 MED ORDER — DIPHENHYDRAMINE HCL 25 MG PO CAPS
25.0000 mg | ORAL_CAPSULE | Freq: Once | ORAL | Status: AC
  Administered 2015-08-16: 25 mg via ORAL

## 2015-08-16 MED ORDER — SODIUM CHLORIDE 0.9 % IV SOLN
2.0000 mg | Freq: Once | INTRAVENOUS | Status: AC
  Administered 2015-08-16: 2 mg via INTRAVENOUS
  Filled 2015-08-16: qty 2

## 2015-08-16 MED ORDER — PEGFILGRASTIM 6 MG/0.6ML ~~LOC~~ PSKT
6.0000 mg | PREFILLED_SYRINGE | Freq: Once | SUBCUTANEOUS | Status: DC
  Filled 2015-08-16: qty 0.6

## 2015-08-16 MED ORDER — HYDROMORPHONE HCL 2 MG PO TABS: 2.0000 mg | ORAL_TABLET | ORAL | Status: DC | PRN

## 2015-08-16 MED ORDER — DOXORUBICIN HCL CHEMO IV INJECTION 2 MG/ML
48.0000 mg/m2 | Freq: Once | INTRAVENOUS | Status: AC
  Administered 2015-08-16: 88 mg via INTRAVENOUS
  Filled 2015-08-16: qty 44

## 2015-08-16 NOTE — Progress Notes (Addendum)
[place] OFFICE PROGRESS NOTE   Diagnosis:  Non-Hodgkin's lymphoma  INTERVAL HISTORY:   Mr. Reierson returns as scheduled. He completed cycle 5 CHOP/Rituxan 07/26/2015.  He denies nausea/vomiting. No mouth sores. No diarrhea. He does not feel that he empties completely after a bowel movement. He is taking laxatives. No incontinence of stool. No numbness or tingling in his hands. Stable intermittent numbness and pain involving the right foot/leg. He noted no improvement in the leg pain with a trial of Lyrica. He thinks that the right leg is somewhat weaker as compared to December 2016.  Objective:  Vital signs in last 24 hours:  Blood pressure 114/88, pulse 90, temperature 98.6 F (37 C), temperature source Oral, resp. rate 17, height 6' (1.829 m), weight 153 lb 1.6 oz (69.446 kg), SpO2 100 %.    HEENT:  No thrush or ulcers. Resp:  Lungs clear bilaterally. Cardio:  Regular rate and rhythm. GI:  Abdomen soft and nontender. No hepatomegaly. Vascular: 1+ pitting edema left lower leg. 2+ pitting edema right lower leg and foot. Neuro:  Right footdrop. Right leg strength otherwise appears intact.     Lab Results:  Lab Results  Component Value Date   WBC 6.3 08/16/2015   HGB 12.1* 08/16/2015   HCT 36.1* 08/16/2015   MCV 91.2 08/16/2015   PLT 198 08/16/2015   NEUTROABS 4.1 08/16/2015    Imaging:  No results found.  Medications: I have reviewed the patient's current medications.  Assessment/Plan: 1.  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 History of thrombocytopenia predating chemotherapy 3. T7 compression fracture-likely pathologic 4. 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 Right foot pain-likely secondary to nerve compression from the sacral mass, improved 6. Diffuse erythematous rash, appearance consistent with a "drug" rash-resolved 7. History of low-grade fever likely secondary to atelectasis/postop fever versus tumor fever 8. Hepatitis B core antibody positive 9. Altered mental status-likely secondary delirium in the setting of infection and hospitalization 10. Admission 04/18/2015 through 04/23/2015 presenting with fever; amylase and lipase elevated.   Abdominal CT showed inflammation in the left upper quadrant and associated loculated fluid collections status post drainage procedure, fluid with high levels of amylase and lipase; cultures negative; question pancreatitis.   04/26/2015 lipase mildly elevated, amylase normal.   CT abdomen/pelvis 05/14/2015 with a persistent 6.5 x 3.8 cm posterior pancreatic tail fluid collection similar in size to the 04/18/2015 CT study. Additional smaller fluid collections in the far lateral left upper quadrant; extensive patchy groundglass opacity at the lung bases, significantly increased; stable expansile lytic lesion in the upper right sacrum. 11. Bilateral lower extremity DVTs on a Doppler 04/18/2015, now on Lovenox. 12. Severe neutropenia 05/13/2015. Likely delayed effect of Rituxan. Resolved 13. Chest x-ray 05/15/2015 with bilateral mid to lower lung interstitial thickening with some hazy lower lung zone groundglass  opacity. Partially improved on the chest x-ray 05/24/2015 14. New-onset  rapid atrial fibrillation 05/29/2015 15. Admission with acute onset upper abdomen/low anterior chest pain 07/07/2015-most likely related to Neulasta, spontaneously resolved   Disposition: Mr. Spragg appears unchanged. He has completed 5 cycles of CHOP/Rituxan. Plan to proceed with the sixth and final cycle today as scheduled. He will return for a Neulasta injection on 08/19/2015.  He noted no improvement in the right leg pain following a trial of Lyrica. He will begin Cymbalta 20 mg daily.  He will receive the influenza vaccine today.  He will return for a nadir CBC 08/26/2015. He will return for a follow up visit and labs in approximately one month.  Patient seen with Dr. Benay Spice. 25 minutes were spent face-to-face at today's visit with the majority of that time involved and counseling/coordination of care.  Ned Card ANP/GNP-BC   08/16/2015  12:44 PM  This was a shared visit with Ned Card. Mr. Ekholm was interviewed and examined. He continues to have an improved performance status. The plan is to complete a final cycle of CHOP/rituximab today. He will receive Neulasta support.  He will begin a trial of Cymbalta for the right foot pain.  Julieanne Manson, M.D.

## 2015-08-16 NOTE — Patient Instructions (Signed)
[facility] Discharge Instructions for Patients Receiving Chemotherapy  Today you received the following chemotherapy agents:  Rituxan, Adriamycin, Vincristine, Cytoxan  To help prevent nausea and vomiting after your treatment, we encourage you to take your nausea medication as prescribed.   If you develop nausea and vomiting that is not controlled by your nausea medication, call the clinic.   BELOW ARE SYMPTOMS THAT SHOULD BE REPORTED IMMEDIATELY:  *FEVER GREATER THAN 100.5 F  *CHILLS WITH OR WITHOUT FEVER  NAUSEA AND VOMITING THAT IS NOT CONTROLLED WITH YOUR NAUSEA MEDICATION  *UNUSUAL SHORTNESS OF BREATH  *UNUSUAL BRUISING OR BLEEDING  TENDERNESS IN MOUTH AND THROAT WITH OR WITHOUT PRESENCE OF ULCERS  *URINARY PROBLEMS  *BOWEL PROBLEMS  UNUSUAL RASH Items with * indicate a potential emergency and should be followed up as soon as possible.  Feel free to call the clinic you have any questions or concerns. The clinic phone number is [phone].  Please show the [facility] at check-in to the Emergency Department and triage nurse.

## 2015-08-19 ENCOUNTER — Telehealth: Payer: Self-pay | Admitting: Nurse Practitioner

## 2015-08-19 ENCOUNTER — Ambulatory Visit (HOSPITAL_BASED_OUTPATIENT_CLINIC_OR_DEPARTMENT_OTHER): Payer: Medicare Other

## 2015-08-19 VITALS — BP 107/80 | HR 79 | Temp 98.1°F

## 2015-08-19 DIAGNOSIS — C8580 Other specified types of non-Hodgkin lymphoma, unspecified site: Secondary | ICD-10-CM | POA: Diagnosis not present

## 2015-08-19 DIAGNOSIS — Z5189 Encounter for other specified aftercare: Secondary | ICD-10-CM | POA: Diagnosis not present

## 2015-08-19 MED ORDER — PEGFILGRASTIM INJECTION 6 MG/0.6ML ~~LOC~~
6.0000 mg | PREFILLED_SYRINGE | Freq: Once | SUBCUTANEOUS | Status: AC
  Administered 2015-08-19: 6 mg via SUBCUTANEOUS
  Filled 2015-08-19: qty 0.6

## 2015-08-19 NOTE — Telephone Encounter (Signed)
Pt confirmed labs/ov per 02/10 POF, gave pt AVS and Calendar... KJ pt came in on 02/13 to get schedule and inj

## 2015-08-20 DIAGNOSIS — M6281 Muscle weakness (generalized): Secondary | ICD-10-CM | POA: Diagnosis not present

## 2015-08-20 DIAGNOSIS — M21371 Foot drop, right foot: Secondary | ICD-10-CM | POA: Diagnosis not present

## 2015-08-20 DIAGNOSIS — E440 Moderate protein-calorie malnutrition: Secondary | ICD-10-CM | POA: Diagnosis not present

## 2015-08-20 DIAGNOSIS — C8380 Other non-follicular lymphoma, unspecified site: Secondary | ICD-10-CM | POA: Diagnosis not present

## 2015-08-20 DIAGNOSIS — R269 Unspecified abnormalities of gait and mobility: Secondary | ICD-10-CM | POA: Diagnosis not present

## 2015-08-20 DIAGNOSIS — K869 Disease of pancreas, unspecified: Secondary | ICD-10-CM | POA: Diagnosis not present

## 2015-08-20 DIAGNOSIS — D72829 Elevated white blood cell count, unspecified: Secondary | ICD-10-CM | POA: Diagnosis not present

## 2015-08-20 DIAGNOSIS — C7951 Secondary malignant neoplasm of bone: Secondary | ICD-10-CM | POA: Diagnosis not present

## 2015-08-22 DIAGNOSIS — K869 Disease of pancreas, unspecified: Secondary | ICD-10-CM | POA: Diagnosis not present

## 2015-08-22 DIAGNOSIS — E440 Moderate protein-calorie malnutrition: Secondary | ICD-10-CM | POA: Diagnosis not present

## 2015-08-22 DIAGNOSIS — C8380 Other non-follicular lymphoma, unspecified site: Secondary | ICD-10-CM | POA: Diagnosis not present

## 2015-08-22 DIAGNOSIS — C7951 Secondary malignant neoplasm of bone: Secondary | ICD-10-CM | POA: Diagnosis not present

## 2015-08-22 DIAGNOSIS — M6281 Muscle weakness (generalized): Secondary | ICD-10-CM | POA: Diagnosis not present

## 2015-08-22 DIAGNOSIS — D72829 Elevated white blood cell count, unspecified: Secondary | ICD-10-CM | POA: Diagnosis not present

## 2015-08-22 DIAGNOSIS — M21371 Foot drop, right foot: Secondary | ICD-10-CM | POA: Diagnosis not present

## 2015-08-22 DIAGNOSIS — R269 Unspecified abnormalities of gait and mobility: Secondary | ICD-10-CM | POA: Diagnosis not present

## 2015-08-26 ENCOUNTER — Other Ambulatory Visit (HOSPITAL_BASED_OUTPATIENT_CLINIC_OR_DEPARTMENT_OTHER): Payer: Medicare Other

## 2015-08-26 DIAGNOSIS — C8580 Other specified types of non-Hodgkin lymphoma, unspecified site: Secondary | ICD-10-CM | POA: Diagnosis not present

## 2015-08-26 LAB — CBC WITH DIFFERENTIAL/PLATELET
BASO%: 0.7 % (ref 0.0–2.0)
BASOS ABS: 0.1 10*3/uL (ref 0.0–0.1)
EOS%: 0.1 % (ref 0.0–7.0)
Eosinophils Absolute: 0 10*3/uL (ref 0.0–0.5)
HCT: 33.3 % — ABNORMAL LOW (ref 38.4–49.9)
HEMOGLOBIN: 11.3 g/dL — AB (ref 13.0–17.1)
LYMPH#: 1.1 10*3/uL (ref 0.9–3.3)
LYMPH%: 10 % — ABNORMAL LOW (ref 14.0–49.0)
MCH: 30.4 pg (ref 27.2–33.4)
MCHC: 33.9 g/dL (ref 32.0–36.0)
MCV: 89.5 fL (ref 79.3–98.0)
MONO#: 1.3 10*3/uL — AB (ref 0.1–0.9)
MONO%: 11.8 % (ref 0.0–14.0)
NEUT#: 8.3 10*3/uL — ABNORMAL HIGH (ref 1.5–6.5)
NEUT%: 77.4 % — AB (ref 39.0–75.0)
NRBC: 1 % — AB (ref 0–0)
PLATELETS: 64 10*3/uL — AB (ref 140–400)
RBC: 3.72 10*6/uL — AB (ref 4.20–5.82)
RDW: 17.5 % — AB (ref 11.0–14.6)
WBC: 10.8 10*3/uL — AB (ref 4.0–10.3)

## 2015-08-26 LAB — TECHNOLOGIST REVIEW

## 2015-08-27 ENCOUNTER — Telehealth: Payer: Self-pay | Admitting: Nurse Practitioner

## 2015-08-27 ENCOUNTER — Other Ambulatory Visit: Payer: Self-pay | Admitting: Nurse Practitioner

## 2015-08-27 DIAGNOSIS — C7951 Secondary malignant neoplasm of bone: Secondary | ICD-10-CM | POA: Diagnosis not present

## 2015-08-27 DIAGNOSIS — M6281 Muscle weakness (generalized): Secondary | ICD-10-CM | POA: Diagnosis not present

## 2015-08-27 DIAGNOSIS — M21371 Foot drop, right foot: Secondary | ICD-10-CM | POA: Diagnosis not present

## 2015-08-27 DIAGNOSIS — D72829 Elevated white blood cell count, unspecified: Secondary | ICD-10-CM | POA: Diagnosis not present

## 2015-08-27 DIAGNOSIS — E440 Moderate protein-calorie malnutrition: Secondary | ICD-10-CM | POA: Diagnosis not present

## 2015-08-27 DIAGNOSIS — K869 Disease of pancreas, unspecified: Secondary | ICD-10-CM | POA: Diagnosis not present

## 2015-08-27 DIAGNOSIS — C8580 Other specified types of non-Hodgkin lymphoma, unspecified site: Secondary | ICD-10-CM

## 2015-08-27 DIAGNOSIS — C8380 Other non-follicular lymphoma, unspecified site: Secondary | ICD-10-CM | POA: Diagnosis not present

## 2015-08-27 DIAGNOSIS — R269 Unspecified abnormalities of gait and mobility: Secondary | ICD-10-CM | POA: Diagnosis not present

## 2015-08-27 NOTE — Telephone Encounter (Signed)
cld pt and spoke w/spouse and adv of lab appt on 2/23 per pof

## 2015-08-27 NOTE — Telephone Encounter (Signed)
As per Owens Shark NP call placed to Pt. Spoke with Pt.'s wife to inform them that Pt.'s Platelet count was decreased on recent labs and to call if any bleeding occurs. Blood work was ordered for 08/29/15 . Pt.'s wife confirmed that they were called for lab appointment and she verbalized understanding.

## 2015-08-27 NOTE — Telephone Encounter (Signed)
-----   Message from Owens Shark, NP sent at 08/27/2015 12:48 PM EST ----- Please let him know platelet count was decreased on recent labs. Call with any bleeding. Need to repeat CBC on 08/29/2015. I entered the order and sent the POF. Thanks

## 2015-08-28 ENCOUNTER — Other Ambulatory Visit (HOSPITAL_BASED_OUTPATIENT_CLINIC_OR_DEPARTMENT_OTHER): Payer: Medicare Other

## 2015-08-28 ENCOUNTER — Telehealth: Payer: Self-pay | Admitting: Medical Oncology

## 2015-08-28 ENCOUNTER — Telehealth: Payer: Self-pay | Admitting: Oncology

## 2015-08-28 ENCOUNTER — Encounter: Payer: Self-pay | Admitting: Gastroenterology

## 2015-08-28 ENCOUNTER — Ambulatory Visit (HOSPITAL_BASED_OUTPATIENT_CLINIC_OR_DEPARTMENT_OTHER): Payer: Medicare Other | Admitting: Oncology

## 2015-08-28 VITALS — BP 97/60 | HR 108 | Temp 97.8°F | Resp 18 | Ht 72.0 in | Wt 148.2 lb

## 2015-08-28 DIAGNOSIS — R410 Disorientation, unspecified: Secondary | ICD-10-CM

## 2015-08-28 DIAGNOSIS — C8580 Other specified types of non-Hodgkin lymphoma, unspecified site: Secondary | ICD-10-CM | POA: Diagnosis not present

## 2015-08-28 DIAGNOSIS — R52 Pain, unspecified: Secondary | ICD-10-CM | POA: Diagnosis not present

## 2015-08-28 DIAGNOSIS — R451 Restlessness and agitation: Secondary | ICD-10-CM

## 2015-08-28 DIAGNOSIS — G4700 Insomnia, unspecified: Secondary | ICD-10-CM | POA: Diagnosis not present

## 2015-08-28 LAB — CBC WITH DIFFERENTIAL/PLATELET
BASO%: 0.4 % (ref 0.0–2.0)
BASOS ABS: 0.1 10*3/uL (ref 0.0–0.1)
EOS ABS: 0 10*3/uL (ref 0.0–0.5)
EOS%: 0 % (ref 0.0–7.0)
HEMATOCRIT: 35.2 % — AB (ref 38.4–49.9)
HEMOGLOBIN: 11.9 g/dL — AB (ref 13.0–17.1)
LYMPH#: 1.5 10*3/uL (ref 0.9–3.3)
LYMPH%: 10.2 % — ABNORMAL LOW (ref 14.0–49.0)
MCH: 30.3 pg (ref 27.2–33.4)
MCHC: 33.8 g/dL (ref 32.0–36.0)
MCV: 89.6 fL (ref 79.3–98.0)
MONO#: 0.9 10*3/uL (ref 0.1–0.9)
MONO%: 5.9 % (ref 0.0–14.0)
NEUT#: 12.1 10*3/uL — ABNORMAL HIGH (ref 1.5–6.5)
NEUT%: 83.5 % — AB (ref 39.0–75.0)
Platelets: 97 10*3/uL — ABNORMAL LOW (ref 140–400)
RBC: 3.93 10*6/uL — ABNORMAL LOW (ref 4.20–5.82)
RDW: 18.1 % — ABNORMAL HIGH (ref 11.0–14.6)
WBC: 14.5 10*3/uL — ABNORMAL HIGH (ref 4.0–10.3)
nRBC: 1 % — ABNORMAL HIGH (ref 0–0)

## 2015-08-28 LAB — COMPREHENSIVE METABOLIC PANEL
ALT: 23 U/L (ref 0–55)
ANION GAP: 8 meq/L (ref 3–11)
AST: 24 U/L (ref 5–34)
Albumin: 2.9 g/dL — ABNORMAL LOW (ref 3.5–5.0)
Alkaline Phosphatase: 85 U/L (ref 40–150)
BUN: 9.1 mg/dL (ref 7.0–26.0)
CHLORIDE: 104 meq/L (ref 98–109)
CO2: 27 meq/L (ref 22–29)
CREATININE: 0.8 mg/dL (ref 0.7–1.3)
Calcium: 9.1 mg/dL (ref 8.4–10.4)
EGFR: 88 mL/min/{1.73_m2} — ABNORMAL LOW (ref >90)
Glucose: 101 mg/dl (ref 70–140)
Potassium: 3.8 mEq/L (ref 3.5–5.1)
Sodium: 139 mEq/L (ref 136–145)
Total Bilirubin: <0.3 mg/dL (ref 0.20–1.20)
Total Protein: 5.9 g/dL — ABNORMAL LOW (ref 6.4–8.3)

## 2015-08-28 LAB — LACTATE DEHYDROGENASE: LDH: 315 U/L — ABNORMAL HIGH (ref 125–245)

## 2015-08-28 NOTE — Telephone Encounter (Signed)
Wife reports pt is confused and upset at times since Monday night.He is saying this is not his house , how long is he going to be here. She held cymbalta and ativan last night , he slept well. He took dilaudid last night like he does every night.  "He is confused like he was in October with drugs". She denies fever, falls or injuries. Per Dr Benay Spice I instructed wife to bring pt to cancer center now for appts.

## 2015-08-28 NOTE — Telephone Encounter (Signed)
per pof to sch pt-per Diane she spoke to spouse this morning and they are opn the way-chgd lab and added MD

## 2015-08-28 NOTE — Progress Notes (Signed)
[facility] OFFICE PROGRESS NOTE   Diagnosis: Non-Hodgkin's lymphoma  INTERVAL HISTORY:   Perry Nielsen returns prior to a scheduled visit. His wife reports he has been agitated and confused for the past few days. He completed another cycle of CHOP-rituximab 08/16/2015. He reports tolerating the chemotherapy well. He has developed malaise over the past week. He continues physical therapy. No focal neurologic symptoms. No fever. Good appetite. He started Cymbalta following the last office visit. He takes 2 Dilaudid tablets in the evening for relief of pain and insomnia. He takes Ativan during the day. Objective:  Vital signs in last 24 hours:  Blood pressure 97/60, pulse 108, temperature 97.8 F (36.6 C), temperature source Oral, resp. rate 18, height 6' (1.829 m), weight 148 lb 3.2 oz (67.223 kg), SpO2 97 %.    HEENT: No thrush or ulcers Resp: Lungs clear bilaterally Cardio: Regular rate and rhythm GI: No hepatomegaly, nontender, no mass Vascular: Trace-1+ pitting edema at the right lower leg and foot, no erythema Neuro: Alert, oriented to year and place. He follows commands. The motor exam appears grossly intact except 3/5 strength with dorsi flexion at the right foot. Slow to answer questions.      Lab Results:  Lab Results  Component Value Date   WBC 14.5* 08/28/2015   HGB 11.9* 08/28/2015   HCT 35.2* 08/28/2015   MCV 89.6 08/28/2015   PLT 97* 08/28/2015   NEUTROABS 12.1* 08/28/2015   Creatinine 0.8, calcium 9.1, LDH 3:15, albumin 2.9  Medications: I have reviewed the patient's current medications.  Assessment/Plan: 1. 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 History of thrombocytopenia predating chemotherapy 3. T7 compression fracture-likely pathologic 4. 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 Right foot pain-likely secondary to nerve compression from the sacral mass, improved 6. Diffuse erythematous rash, appearance consistent with a "drug" rash-resolved 7. History of low-grade fever likely secondary to atelectasis/postop fever versus tumor fever 8. Hepatitis B core antibody positive 9. Altered mental status-likely secondary delirium in the setting of infection and hospitalization 10. Admission 04/18/2015 through 04/23/2015 presenting with fever; amylase and lipase elevated.   Abdominal CT showed inflammation in the left upper quadrant and associated loculated fluid collections status post drainage procedure, fluid with high levels of amylase and lipase; cultures negative; question pancreatitis.   04/26/2015 lipase mildly elevated, amylase normal.   CT abdomen/pelvis 05/14/2015 with a persistent 6.5 x 3.8 cm posterior pancreatic tail fluid collection similar in size to the 04/18/2015 CT study. Additional smaller fluid collections in the far lateral left upper quadrant; extensive patchy groundglass opacity at the lung bases, significantly increased; stable expansile lytic lesion in the upper right sacrum. 11. Bilateral lower extremity DVTs on a Doppler 04/18/2015, now on xarelto 12. Severe neutropenia 05/13/2015. Likely delayed effect of Rituxan. Resolved 13. Chest  x-ray 05/15/2015 with bilateral mid to lower lung interstitial thickening with some hazy lower  lung zone groundglass opacity. Partially improved on the chest x-ray 05/24/2015 14. New-onset rapid atrial fibrillation 05/29/2015 15. Admission with acute onset upper abdomen/low anterior chest pain 07/07/2015-most likely related to Neulasta, spontaneously resolved 16. Altered mental status-similar to confusion he has had in the past, potentially related to polypharmacy    Disposition:  Perry Nielsen is now at day 78 following the final planned cycle of CHOP/rituximab. He presents today with agitation/confusion. I suspect this is related to polypharmacy. I have a low clinical suspicion for a primary CNS process. He will discontinue Cymbalta and limit Dilaudid to one tablet as needed. He will use Ativan for agitation. His wife will contact us if his mental status is not improved over the next few days.  He is at risk for CNS relapse. He was not a candidate for intrathecal prophylaxis during the course of CHOP/rituximab. We will consider intrathecal prophylaxis depending on his performance status when he returns 09/10/2015.  Mildly elevated LDH may be related to the pseudocyst collection. There is no other evidence for progression of lymphoma.  Betsy Coder, MD  08/28/2015  11:44 AM

## 2015-08-29 ENCOUNTER — Other Ambulatory Visit: Payer: Self-pay

## 2015-08-29 ENCOUNTER — Ambulatory Visit: Payer: Self-pay | Admitting: Nurse Practitioner

## 2015-08-29 DIAGNOSIS — C8380 Other non-follicular lymphoma, unspecified site: Secondary | ICD-10-CM | POA: Diagnosis not present

## 2015-08-29 DIAGNOSIS — C7951 Secondary malignant neoplasm of bone: Secondary | ICD-10-CM | POA: Diagnosis not present

## 2015-08-29 DIAGNOSIS — E440 Moderate protein-calorie malnutrition: Secondary | ICD-10-CM | POA: Diagnosis not present

## 2015-08-29 DIAGNOSIS — M21371 Foot drop, right foot: Secondary | ICD-10-CM | POA: Diagnosis not present

## 2015-08-29 DIAGNOSIS — R269 Unspecified abnormalities of gait and mobility: Secondary | ICD-10-CM | POA: Diagnosis not present

## 2015-08-29 DIAGNOSIS — M6281 Muscle weakness (generalized): Secondary | ICD-10-CM | POA: Diagnosis not present

## 2015-08-29 DIAGNOSIS — D72829 Elevated white blood cell count, unspecified: Secondary | ICD-10-CM | POA: Diagnosis not present

## 2015-08-29 DIAGNOSIS — K869 Disease of pancreas, unspecified: Secondary | ICD-10-CM | POA: Diagnosis not present

## 2015-09-03 DIAGNOSIS — M6281 Muscle weakness (generalized): Secondary | ICD-10-CM | POA: Diagnosis not present

## 2015-09-03 DIAGNOSIS — M21371 Foot drop, right foot: Secondary | ICD-10-CM | POA: Diagnosis not present

## 2015-09-03 DIAGNOSIS — D72829 Elevated white blood cell count, unspecified: Secondary | ICD-10-CM | POA: Diagnosis not present

## 2015-09-03 DIAGNOSIS — C7951 Secondary malignant neoplasm of bone: Secondary | ICD-10-CM | POA: Diagnosis not present

## 2015-09-03 DIAGNOSIS — K869 Disease of pancreas, unspecified: Secondary | ICD-10-CM | POA: Diagnosis not present

## 2015-09-03 DIAGNOSIS — R269 Unspecified abnormalities of gait and mobility: Secondary | ICD-10-CM | POA: Diagnosis not present

## 2015-09-03 DIAGNOSIS — E440 Moderate protein-calorie malnutrition: Secondary | ICD-10-CM | POA: Diagnosis not present

## 2015-09-03 DIAGNOSIS — C8380 Other non-follicular lymphoma, unspecified site: Secondary | ICD-10-CM | POA: Diagnosis not present

## 2015-09-05 DIAGNOSIS — C7951 Secondary malignant neoplasm of bone: Secondary | ICD-10-CM | POA: Diagnosis not present

## 2015-09-05 DIAGNOSIS — R269 Unspecified abnormalities of gait and mobility: Secondary | ICD-10-CM | POA: Diagnosis not present

## 2015-09-05 DIAGNOSIS — E440 Moderate protein-calorie malnutrition: Secondary | ICD-10-CM | POA: Diagnosis not present

## 2015-09-05 DIAGNOSIS — M6281 Muscle weakness (generalized): Secondary | ICD-10-CM | POA: Diagnosis not present

## 2015-09-05 DIAGNOSIS — K869 Disease of pancreas, unspecified: Secondary | ICD-10-CM | POA: Diagnosis not present

## 2015-09-05 DIAGNOSIS — M21371 Foot drop, right foot: Secondary | ICD-10-CM | POA: Diagnosis not present

## 2015-09-05 DIAGNOSIS — C8380 Other non-follicular lymphoma, unspecified site: Secondary | ICD-10-CM | POA: Diagnosis not present

## 2015-09-05 DIAGNOSIS — D72829 Elevated white blood cell count, unspecified: Secondary | ICD-10-CM | POA: Diagnosis not present

## 2015-09-10 ENCOUNTER — Telehealth: Payer: Self-pay | Admitting: Oncology

## 2015-09-10 ENCOUNTER — Other Ambulatory Visit: Payer: Self-pay | Admitting: *Deleted

## 2015-09-10 ENCOUNTER — Other Ambulatory Visit (HOSPITAL_BASED_OUTPATIENT_CLINIC_OR_DEPARTMENT_OTHER): Payer: Medicare Other

## 2015-09-10 ENCOUNTER — Ambulatory Visit (HOSPITAL_BASED_OUTPATIENT_CLINIC_OR_DEPARTMENT_OTHER): Payer: Medicare Other | Admitting: Oncology

## 2015-09-10 VITALS — BP 97/51 | HR 108 | Temp 97.8°F | Resp 17 | Ht 72.0 in | Wt 146.5 lb

## 2015-09-10 DIAGNOSIS — R4182 Altered mental status, unspecified: Secondary | ICD-10-CM | POA: Diagnosis not present

## 2015-09-10 DIAGNOSIS — G4700 Insomnia, unspecified: Secondary | ICD-10-CM

## 2015-09-10 DIAGNOSIS — R269 Unspecified abnormalities of gait and mobility: Secondary | ICD-10-CM | POA: Diagnosis not present

## 2015-09-10 DIAGNOSIS — M21371 Foot drop, right foot: Secondary | ICD-10-CM | POA: Diagnosis not present

## 2015-09-10 DIAGNOSIS — I4891 Unspecified atrial fibrillation: Secondary | ICD-10-CM | POA: Diagnosis not present

## 2015-09-10 DIAGNOSIS — C8580 Other specified types of non-Hodgkin lymphoma, unspecified site: Secondary | ICD-10-CM | POA: Diagnosis not present

## 2015-09-10 DIAGNOSIS — E440 Moderate protein-calorie malnutrition: Secondary | ICD-10-CM | POA: Diagnosis not present

## 2015-09-10 DIAGNOSIS — C7951 Secondary malignant neoplasm of bone: Secondary | ICD-10-CM | POA: Diagnosis not present

## 2015-09-10 DIAGNOSIS — C8380 Other non-follicular lymphoma, unspecified site: Secondary | ICD-10-CM | POA: Diagnosis not present

## 2015-09-10 DIAGNOSIS — D72829 Elevated white blood cell count, unspecified: Secondary | ICD-10-CM | POA: Diagnosis not present

## 2015-09-10 DIAGNOSIS — M6281 Muscle weakness (generalized): Secondary | ICD-10-CM | POA: Diagnosis not present

## 2015-09-10 DIAGNOSIS — K869 Disease of pancreas, unspecified: Secondary | ICD-10-CM | POA: Diagnosis not present

## 2015-09-10 LAB — LACTATE DEHYDROGENASE: LDH: 225 U/L (ref 125–245)

## 2015-09-10 LAB — COMPREHENSIVE METABOLIC PANEL
ALT: 31 U/L (ref 0–55)
AST: 28 U/L (ref 5–34)
Albumin: 3 g/dL — ABNORMAL LOW (ref 3.5–5.0)
Alkaline Phosphatase: 67 U/L (ref 40–150)
Anion Gap: 10 mEq/L (ref 3–11)
BUN: 10.5 mg/dL (ref 7.0–26.0)
CHLORIDE: 107 meq/L (ref 98–109)
CO2: 24 meq/L (ref 22–29)
Calcium: 9.1 mg/dL (ref 8.4–10.4)
Creatinine: 0.8 mg/dL (ref 0.7–1.3)
EGFR: 89 mL/min/{1.73_m2} — AB (ref >90)
GLUCOSE: 81 mg/dL (ref 70–140)
POTASSIUM: 3.2 meq/L — AB (ref 3.5–5.1)
SODIUM: 141 meq/L (ref 136–145)
Total Bilirubin: 0.41 mg/dL (ref 0.20–1.20)
Total Protein: 6 g/dL — ABNORMAL LOW (ref 6.4–8.3)

## 2015-09-10 LAB — CBC WITH DIFFERENTIAL/PLATELET
BASO%: 0.2 % (ref 0.0–2.0)
BASOS ABS: 0 10*3/uL (ref 0.0–0.1)
EOS ABS: 0 10*3/uL (ref 0.0–0.5)
EOS%: 0.4 % (ref 0.0–7.0)
HCT: 37.7 % — ABNORMAL LOW (ref 38.4–49.9)
HGB: 12.6 g/dL — ABNORMAL LOW (ref 13.0–17.1)
LYMPH%: 13.2 % — AB (ref 14.0–49.0)
MCH: 31.3 pg (ref 27.2–33.4)
MCHC: 33.4 g/dL (ref 32.0–36.0)
MCV: 93.5 fL (ref 79.3–98.0)
MONO#: 1.6 10*3/uL — ABNORMAL HIGH (ref 0.1–0.9)
MONO%: 19.3 % — AB (ref 0.0–14.0)
NEUT%: 66.9 % (ref 39.0–75.0)
NEUTROS ABS: 5.4 10*3/uL (ref 1.5–6.5)
Platelets: 280 10*3/uL (ref 140–400)
RBC: 4.03 10*6/uL — AB (ref 4.20–5.82)
RDW: 19.3 % — ABNORMAL HIGH (ref 11.0–14.6)
WBC: 8.1 10*3/uL (ref 4.0–10.3)
lymph#: 1.1 10*3/uL (ref 0.9–3.3)

## 2015-09-10 MED ORDER — MEGESTROL ACETATE 40 MG/ML PO SUSP
200.0000 mg | Freq: Two times a day (BID) | ORAL | Status: DC
Start: 2015-09-10 — End: 2015-10-14

## 2015-09-10 NOTE — Progress Notes (Signed)
[facility] OFFICE PROGRESS NOTE   Diagnosis:  Non-Hodgkin's lymphoma  INTERVAL HISTORY:     [name] returns as scheduled. His wife reports he is been less agitated and confused. He continues to have memory difficulty. He is participated in home physical therapy. The pain at the right foot has improved. His chief complaint today is insomnia. His appetite has diminished.  Objective:  Vital signs in last 24 hours:  Blood pressure 97/51, pulse 108, temperature 97.8 F (36.6 C), temperature source Oral, resp. rate 17, height 6' (1.829 m), weight 146 lb 8 oz (66.452 kg), SpO2 96 %.    HEENT:  No thrush Lymphatics:  No cervical, supraclavicular, left axillary, or inguinal nodes. 1 cm soft mobile right axillary node Resp:  Lungs clear bilaterally Cardio:  Regular rate and rhythm GI:  No hepatomegaly, nontender, no mass Vascular:  Trace to 1+ pitting edema at the lower leg/ankle bilaterally Neuro: alert and oriented, the motor exam appears intact except for 3 /5 strength with dorsi flexion at the right foot      Lab Results:  Lab Results  Component Value Date   WBC 8.1 09/10/2015   HGB 12.6* 09/10/2015   HCT 37.7* 09/10/2015   MCV 93.5 09/10/2015   PLT 280 09/10/2015   NEUTROABS 5.4 09/10/2015    LDH 225  Medications: I have reviewed the patient's current medications.  Assessment/Plan: 1. 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 History of thrombocytopenia predating chemotherapy 3. T7 compression fracture-likely pathologic 4. 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 Right foot pain-likely secondary to nerve compression from the sacral mass, improved 6. Diffuse erythematous rash, appearance consistent with a "drug" rash-resolved 7. History of low-grade fever likely secondary to atelectasis/postop fever versus tumor fever 8. Hepatitis B core antibody positive 9. Altered mental status-likely secondary delirium in the setting of infection and hospitalization 10. Admission 04/18/2015 through 04/23/2015 presenting with fever; amylase and lipase elevated.   Abdominal CT showed inflammation in the left upper quadrant and associated loculated fluid collections status post drainage procedure, fluid with high levels of amylase and lipase; cultures negative; question pancreatitis.   04/26/2015 lipase mildly elevated, amylase normal.   CT abdomen/pelvis 05/14/2015 with a persistent 6.5 x 3.8 cm posterior pancreatic tail fluid collection similar in size to the 04/18/2015 CT study. Additional smaller fluid collections in the far lateral left upper quadrant; extensive patchy groundglass opacity at the lung bases, significantly increased; stable expansile lytic lesion in the upper right sacrum. 11. Bilateral lower extremity DVTs on a Doppler 04/18/2015, now on Lovenox. 12. Severe neutropenia 05/13/2015. Likely delayed effect of Rituxan. Resolved 13. Chest x-ray 05/15/2015 with bilateral mid to lower lung interstitial thickening with some hazy lower lung zone groundglass opacity. Partially improved on the chest x-ray 05/24/2015 14. New-onset rapid atrial fibrillation 05/29/2015 15. Admission  with acute onset upper abdomen/low anterior chest pain 07/07/2015-most likely  related to Neulasta, spontaneously resolved 16.  Altered mental status- partially improved     Disposition:   [name] appears stable. The LDH has normalized. No clinical evidence for progression of the  Lymphoma. I have a low clinical suspicion for CNS disease. We discussed intrathecal prophylaxis. We decided to follow him with observation given his borderline performance status and comorbid conditions throughout the course of therapy.   Mr.  Perry Nielsen will return for an office visit in 3-4 weeks.  His wife will contact us in the interim if he develops new symptoms. He will discontinue Dilaudid and try Ativan at a dose of 1 mg each night for insomnia.  Betsy Coder, MD  09/10/2015  10:48 AM

## 2015-09-10 NOTE — Addendum Note (Signed)
Addended by: Brien Few on: 09/10/2015 04:50 PM   Modules accepted: Medications

## 2015-09-10 NOTE — Telephone Encounter (Signed)
Gave wife avs report and appointments for March °

## 2015-09-12 DIAGNOSIS — C7951 Secondary malignant neoplasm of bone: Secondary | ICD-10-CM | POA: Diagnosis not present

## 2015-09-12 DIAGNOSIS — K869 Disease of pancreas, unspecified: Secondary | ICD-10-CM | POA: Diagnosis not present

## 2015-09-12 DIAGNOSIS — M6281 Muscle weakness (generalized): Secondary | ICD-10-CM | POA: Diagnosis not present

## 2015-09-12 DIAGNOSIS — R269 Unspecified abnormalities of gait and mobility: Secondary | ICD-10-CM | POA: Diagnosis not present

## 2015-09-12 DIAGNOSIS — M21371 Foot drop, right foot: Secondary | ICD-10-CM | POA: Diagnosis not present

## 2015-09-12 DIAGNOSIS — C8380 Other non-follicular lymphoma, unspecified site: Secondary | ICD-10-CM | POA: Diagnosis not present

## 2015-09-12 DIAGNOSIS — E440 Moderate protein-calorie malnutrition: Secondary | ICD-10-CM | POA: Diagnosis not present

## 2015-09-12 DIAGNOSIS — D72829 Elevated white blood cell count, unspecified: Secondary | ICD-10-CM | POA: Diagnosis not present

## 2015-09-18 ENCOUNTER — Telehealth: Payer: Self-pay

## 2015-09-18 ENCOUNTER — Other Ambulatory Visit: Payer: Self-pay | Admitting: *Deleted

## 2015-09-18 DIAGNOSIS — C8580 Other specified types of non-Hodgkin lymphoma, unspecified site: Secondary | ICD-10-CM

## 2015-09-18 NOTE — Telephone Encounter (Signed)
Patient's wife calling asking whether the PT referral was placed for patient per Dr. Benay Spice- she states that they have not heard from the physical therapy dept.  Please call patient's wife to update.

## 2015-09-18 NOTE — Telephone Encounter (Signed)
OP PT referral placed.  Pt.'s wife notified of referral and appreciates call.

## 2015-09-24 ENCOUNTER — Ambulatory Visit: Payer: Medicare Other | Attending: Oncology | Admitting: Physical Therapy

## 2015-09-24 DIAGNOSIS — R600 Localized edema: Secondary | ICD-10-CM | POA: Diagnosis not present

## 2015-09-24 DIAGNOSIS — R29898 Other symptoms and signs involving the musculoskeletal system: Secondary | ICD-10-CM

## 2015-09-24 DIAGNOSIS — M79671 Pain in right foot: Secondary | ICD-10-CM | POA: Insufficient documentation

## 2015-09-24 DIAGNOSIS — M2141 Flat foot [pes planus] (acquired), right foot: Secondary | ICD-10-CM | POA: Insufficient documentation

## 2015-09-24 DIAGNOSIS — R269 Unspecified abnormalities of gait and mobility: Secondary | ICD-10-CM | POA: Diagnosis not present

## 2015-09-24 NOTE — Therapy (Signed)
[place], [place], [address] Phone: [phone]   Fax:  [phone]  Physical Therapy Evaluation  Patient Details  Name: Perry Nielsen. MRN: [id number] Date of Birth: Jun 01, 1941 Referring Provider: Dr. Julieanne Manson  Encounter Date: 09/24/2015      PT End of Session - 09/24/15 1712    Visit Number 1   Number of Visits 18   Date for PT Re-Evaluation 11/08/15   PT Start Time 1519   PT Stop Time 1700   PT Time Calculation (min) 101 min   Activity Tolerance Patient tolerated treatment well   Behavior During Therapy [facility] for tasks assessed/performed      Past Medical History  Diagnosis Date  . 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 ALLERGIC RHINITIS 08/03/2007  . GERD 02/18/2007  . KNEE PAIN, LEFT, CHRONIC 08/07/2008  . BACK PAIN 09/18/2009  . HAMMER TOE 08/03/2007  . CAROTID BRUIT, LEFT 08/07/2008  . 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 Foot drop     rt foot  . Rash, skin     torso  . Hepatitis 1977    no residual problems  . Sleeping difficulty   . Skin cancer   . Lymphoma [facility])     Past Surgical History  Procedure Laterality Date  . Shoulder open rotator cuff repair  07/06/05    right  . Knee arthroscopy      left  . Tonsillectomy and adenoidectomy    . Transurethral resection of prostate    . Cataract extraction w/ intraocular lens  implant, bilateral    . 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  Visit Diagnosis:  Edema of both legs - Plan: PT plan of care cert/re-cert  Weakness of foot, right - Plan: PT plan of care cert/re-cert  Gait abnormality - Plan: PT plan of care cert/re-cert  Pain in right foot - Plan: PT plan of care cert/re-cert       Subjective Assessment - 09/24/15 1530    Subjective here about help with drop foot; foot also swells.  Has tried compression socks but he couldn't tolerate them, and he and his wife needed 30 minutes to put them on.   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  Was diagnosed in early August.  Treatment has included radiation for 10 days and had 2 chemos; didn't tolerate the 6 that were planned.    Memory loss started about four weeks; trying to taper meds to ese if that helps.  Was healthy prior to this diagnosis.  Had blood clots in legs in October so is on Xarelto for six months.   Patient Stated Goals get help with drop foot   Currently in Pain? Yes   Pain Score --  1-8 range   Pain Location Toe (Comment which one)  all around toes   Pain Orientation Right   Pain Descriptors / Indicators Other (Comment)  like electrical shock   Pain Frequency Intermittent   Aggravating Factors  footwear: swelling (increases overnight)    Pain Relieving Factors different footwear   Effect of Pain on Daily Activities sleeps in recliner;  wakes up with discomfort            [facility] PT Assessment - 09/24/15 0001    Assessment   Medical Diagnosis lymphoma   Referring Provider Dr. Julieanne Manson   Onset Date/Surgical Date 02/04/15  approx.   Precautions   Precautions Fall;Other (comment)   Precaution Comments bony metastases probable; cancer precautions   Restrictions   Weight Bearing Restrictions No   Balance Screen   Has the patient fallen in the past 6 months Yes   How many times? 1   Has the patient had a decrease in activity level because of a fear of falling?  No  cautious   Is the patient reluctant to leave their home because of a fear of falling?  Yes   [place] Private residence   Living Arrangements Spouse/significant other   Type of [place] Two level   Prior Function   Level of Independence Independent with basic ADLs  requires extra time   Vocation Retired   Leisure TV and sports; is bored; used to swim twice a week, play golf and fish  was a Biochemist, clinical; PE, biology teacher   Cognition   Memory Impaired   Memory Impairment --  per patient's wife   Observation/Other Assessments   Observations has lost about 25 lbs. in this   Other Surveys  --  lymphedema life impact scale score is 39 = 57% impairment   ROM / Strength   AROM / PROM / Strength AROM;PROM;Strength   AROM   Overall AROM Comments left ankle grossly WFL;    AROM Assessment Site Ankle   Right/Left Ankle Right;Left   PROM   Overall PROM Comments right ankle grossly [facility]   Strength   Overall Strength Comments left ankle grossly 4/5   Strength Assessment Site Ankle;Hip   Right/Left Hip Right;Left   Right Hip Flexion 3/5  or more; no resistance tried   Right Hip Extension --  able to bridge with bilat. LEs   Right Hip ABduction 3-/5  or less   Left Hip Flexion 3/5  or more; no resistance tried   Left Hip ABduction 3/5  or more; no resistance tried   Right/Left Ankle Right   Right Ankle Dorsiflexion 3-/5   Right Ankle Plantar Flexion 4/5  approx.; takes good resistance in supine   Right Ankle Inversion --  ??just anterior tib pulling here?   Right Ankle Eversion 2-/5   Ambulation/Gait   Ambulation/Gait Yes   Ambulation/Gait Assistance 6: Modified independent (Device/Increase time)   Assistive device Standard walker   Stairs Yes   Stairs Assistance 5: Supervision  with cane and railing or wall           LYMPHEDEMA/ONCOLOGY QUESTIONNAIRE - 09/24/15 1545    Type   Cancer Type lymphoma   What other symptoms do you have   Are you having pitting edema Yes   Body Site feet and ankles, right > left   Stemmer Sign Yes  right great toe   Lymphedema Assessments   Lymphedema Assessments Lower extremities    Right Lower Extremity Lymphedema   30 cm Proximal to Floor at Lateral Plantar Foot 33.4 cm   20 cm Proximal to Floor at Lateral Plantar Foot 27.4 1   10  cm Proximal to Floor at Lateral Malleoli 26.3 cm   5 cm Proximal to 1st MTP Joint 26.2 cm   Around Proximal Great Toe  10.1 cm   Other 40 cm. proximal to plantar foot 29.9   Left Lower Extremity Lymphedema   30 cm Proximal to Floor at Lateral Plantar Foot 29.6 cm   20 cm Proximal to Floor at Lateral Plantar Foot 25.1 cm   10 cm Proximal to Floor at Lateral Malleoli 26 cm   5 cm Proximal to 1st MTP Joint 23.5 cm   Around Proximal Great Toe 8.2 cm   Other at 40 cm. proximal to plantar foot, 30.4                [facility] Adult PT Treatment/Exercise - 09/24/15 0001    Manual Therapy   Manual Therapy Edema management;Manual Lymphatic Drainage (MLD);Compression Bandaging   Compression Bandaging Bandaged right foot with TG soft stockinette (that to knee), Elastomull on all five toes, Artiflex on foot and ankle only (1/2 of Artiflex roll), and one 8 cm. short stretch bandage on foot                PT Education - 09/24/15 1711    Education provided Yes   Education Details to remove bandages in case of pain   Person(s) Educated Patient;Spouse   Methods Explanation   Comprehension Verbalized understanding           Short Term Clinic Goals - 09/24/15 1724    CC Short Term Goal  #1   Title circumference at base of right great toe will be reduced to 9 cm.    Baseline 10.1 cm. at eval compared to 8.2 on the left   Time 3   Period Weeks   Status New   CC Short Term Goal  #2   Title circumference of right foot at 5 cm. proximal to metatarsal head will be reduced to 25. cm.   Baseline 26.2 cm. compared to 23.5 on the left   Time 3   Period Weeks   Status New   CC Short Term Goal  #3   Title independent with home exercise program for right ankle strengthening   Time 3   Period Weeks   Status New             Long  Term Clinic Goals - 09/24/15 1727    CC Long Term Goal  #1   Title circumference of right foot at 5 cm. proximal to metatarsal head reduced to 24.5 cm.   Baseline 26.2 cm. at eval compared to 23.5 on the left   Time 6   Period Weeks   Status New   CC Long Term Goal  #2   Title pain in right foot improved at least 50% in frequency and/or intensitiy   Time 6   Period Weeks   Status New   CC Long Term Goal  #3   Title lymphedema life impact scale reduced to 30% or less impairment   Baseline 57% at eval   Time 6   Period Weeks   Status New   CC Long Term Goal  #4   Title Pt. will be able to use an AFO to prevent foot drop and enable safe gait (or no AFO if not needed)   Baseline 6   Period Weeks   Status New            Plan - 09/24/15 1713    Clinical Impression Statement Patient with lymphoma and possible metastases that damaged nerves to right LE resulting in foot drop, pain and swelling in right foot and lower leg.  He has limited AROM and strength in right ankle, impaired gait, and is a fall risk.  We discussed treatment options including complete decongestive therapy in some detail.  We discussed focusing on the swelling primarily first, along with home exercise for ankle strength; once swelling is reduced, focus more on ankle strength and gait safety.  He has an AFO for the right foot drop, but he can't get his shoe on with it; discussed briefly today that he could consider getting a larger shoe for the right foot, but to wait until we reduce the swelling.  He has compression stockings but he and his wife can't get them on, so we need better alternatives for long term edema management (and we began discussion of velcro compressoin and nighttime compression today).   Pt will benefit from skilled therapeutic intervention in order to improve on the following deficits Increased edema;Pain;Decreased strength;Abnormal gait;Decreased activity tolerance;Decreased knowledge of use of  DME;Decreased knowledge of precautions   Rehab Potential Good   PT Frequency 3x / week   PT Duration 6 weeks   PT Treatment/Interventions ADLs/Self Care Home Management;Electrical Stimulation;DME Instruction;Gait training;Functional mobility training;Therapeutic exercise;Patient/family education;Orthotic Fit/Training;Compression bandaging;Manual lymph drainage;Passive range of motion;Taping   PT Next Visit Plan Begin complete decongestive therapy, bandaging right toes, foot, and leg below the knee.  Add to current right ankle strengthening HEP.  Later, help with day- and nighttime compression garments to control swelling.     PT Home Exercise Plan active right ankle dorsiflexion and eversion, doing at least some of this in his recliner with legs elevated   Consulted and Agree with Plan of Care Patient;Family member/caregiver          G-Codes - October 04, 2015 1730    Functional Assessment Tool Used lymphedema life impact scale   Functional Limitation Self care   Self Care Current Status [phone]) At least 40 percent but less than 60 percent impaired, limited or restricted   Self Care Goal Status [id number]) At least 20 percent but less than 40 percent impaired, limited or restricted       Problem List Patient Active Problem List   Diagnosis Date Noted  . Leukocytosis   . Pancreatic mass   . Abdominal pain 07/07/2015  . Pancreatitis 07/07/2015  . DVT (deep venous thrombosis) ([place]) 04/19/2015  . Abdominal fluid collection   . Sepsis ([place]) 04/18/2015  . Intra-abdominal abscess ([place]) 04/18/2015  . Cognitive deficits 03/26/2015  . Thrombocytopenia ([place]) 03/25/2015  . Decubitus ulcer of buttock, stage 2 03/25/2015  . Acute encephalopathy 03/24/2015  . 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 Rash 03/05/2015  . Malnutrition of moderate degree ([place]) 02/05/2015  . Foot drop 02/03/2015  . 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 Backache 09/18/2009  . 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 KNEE  PAIN, LEFT, CHRONIC 08/07/2008  . CAROTID BRUIT, LEFT 08/07/2008  . Allergic rhinitis 08/03/2007  . 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 GERD 02/18/2007  . BPH associated with nocturia 02/18/2007    SALISBURY,DONNA 10-04-2015, 5:34 PM  [place], [place], [address] Phone: [phone]   Fax:  [phone]  Name: Sammer Scafidi. MRN: [id number] Date of Birth: 02-27-1941   Serafina Royals, PT 10/04/2015 5:34 PM

## 2015-09-26 ENCOUNTER — Encounter: Payer: Self-pay | Admitting: Physical Therapy

## 2015-09-26 ENCOUNTER — Ambulatory Visit: Payer: Medicare Other | Admitting: Physical Therapy

## 2015-09-26 DIAGNOSIS — R269 Unspecified abnormalities of gait and mobility: Secondary | ICD-10-CM | POA: Diagnosis not present

## 2015-09-26 DIAGNOSIS — M79671 Pain in right foot: Secondary | ICD-10-CM | POA: Diagnosis not present

## 2015-09-26 DIAGNOSIS — M2141 Flat foot [pes planus] (acquired), right foot: Secondary | ICD-10-CM | POA: Diagnosis not present

## 2015-09-26 DIAGNOSIS — R600 Localized edema: Secondary | ICD-10-CM | POA: Diagnosis not present

## 2015-09-26 NOTE — Therapy (Signed)
[place], [place], [address] Phone: [phone]   Fax:  [phone]  Physical Therapy Treatment  Patient Details  Name: Perry Nielsen. MRN: [id number] Date of Birth: 08/11/1940 Referring Provider: Dr. Julieanne Manson  Encounter Date: 09/26/2015      PT End of Session - 09/26/15 1706    Visit Number 2   Number of Visits 18   Date for PT Re-Evaluation 11/08/15   PT Start Time 1520   PT Stop Time 1607   PT Time Calculation (min) 47 min   Activity Tolerance Patient tolerated treatment well   Behavior During Therapy [facility] for tasks assessed/performed      Past Medical History  Diagnosis Date  . 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 ALLERGIC RHINITIS 08/03/2007  . GERD 02/18/2007  . KNEE PAIN, LEFT, CHRONIC 08/07/2008  . BACK PAIN 09/18/2009  . HAMMER TOE 08/03/2007  . CAROTID BRUIT, LEFT 08/07/2008  . 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 Foot drop     rt foot  . Rash, skin     torso  . Hepatitis 1977    no residual problems  . Sleeping difficulty   . Skin cancer   . Lymphoma [facility])     Past Surgical History  Procedure Laterality Date  . Shoulder open rotator cuff repair  07/06/05    right  . Knee arthroscopy      left  . Tonsillectomy and adenoidectomy    . Transurethral resection of prostate    . Cataract extraction w/ intraocular lens  implant, bilateral    . 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  Visit Diagnosis:  Edema of both legs  Pain in right foot      Subjective Assessment - 09/26/15 1524    Subjective My foot is about the same as it was the day before yesterday. It is worse at night when it swells but it feels ok right now. My bandages came  off about midnight the day they were put on because I had pressure pain.    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  Was diagnosed in early August.  Treatment has included radiation for 10 days and had 2 chemos; didn't tolerate the 6 that were planned.    Memory loss started about four weeks; trying to taper meds to ese if that helps.  Was healthy prior to this diagnosis.  Had blood clots in legs in October so is on Xarelto for six months.   Patient Stated Goals get help with drop foot   Currently in Pain? No/denies   Pain Score 0-No pain                         [facility] Adult PT Treatment/Exercise - 09/26/15 0001    Manual Therapy   Manual Therapy Manual Lymphatic Drainage (MLD)   Manual Lymphatic Drainage (MLD) short neck, superficial and deep abdominals, stimulation of right axillary nodes, established axillo-inguinal anastomosis, RLE working distally to R foot moving fluid laterally and towards pathway, revisiting axillo inguinal anastomosis and finished with axillay lymph nodes  Compression Bandaging TG soft from right foot to below knee, Elastomull to right toes, 1 roll of Artiflex on right foot going up to knee, used 6 cm bandage on foot and 2 -10cm from ankle to knee                   Short Term Clinic Goals - 09/26/15 1528    CC Short Term Goal  #1   Title circumference at base of right great toe will be reduced to 9 cm.    Baseline 10.1 cm. at eval compared to 8.2 on the left, 3/23- 9.5cm    Status On-going   CC Short Term Goal  #2   Title circumference of right foot at 5 cm. proximal to metatarsal head will be reduced to 25. cm.   Baseline 26.2 cm. compared to 23.5 on the left, 3/23 - 25.7cm   Status On-going   CC Short Term Goal  #3   Title independent with home exercise program for right ankle strengthening   Status On-going              Long Term Clinic Goals - 09/24/15 1727    CC Long Term Goal  #1   Title circumference of right foot at 5 cm. proximal to metatarsal head reduced to 24.5 cm.   Baseline 26.2 cm. at eval compared to 23.5 on the left   Time 6   Period Weeks   Status New   CC Long Term Goal  #2   Title pain in right foot improved at least 50% in frequency and/or intensitiy   Time 6   Period Weeks   Status New   CC Long Term Goal  #3   Title lymphedema life impact scale reduced to 30% or less impairment   Baseline 57% at eval   Time 6   Period Weeks   Status New   CC Long Term Goal  #4   Title Pt. will be able to use an AFO to prevent foot drop and enable safe gait (or no AFO if not needed)   Baseline 6   Period Weeks   Status New            Plan - 09/26/15 1706    Clinical Impression Statement Pt demonstrates reduction of right foot edema this day and is progressing towards goals despite not tolerating bandages for long after application. Bandages applied this day from foot to below right knee following decongestive therapy.    Pt will benefit from skilled therapeutic intervention in order to improve on the following deficits Increased edema;Pain;Decreased strength;Abnormal gait;Decreased activity tolerance;Decreased knowledge of use of DME;Decreased knowledge of precautions   Rehab Potential Good   PT Frequency 3x / week   PT Duration 6 weeks   PT Treatment/Interventions ADLs/Self Care Home Management;Electrical Stimulation;DME Instruction;Gait training;Functional mobility training;Therapeutic exercise;Patient/family education;Orthotic Fit/Training;Compression bandaging;Manual lymph drainage;Passive range of motion;Taping   PT Next Visit Plan continue MLD to RLE, bandaging from foot to below knee, add to current HEP for ankle strengthening   PT Home Exercise Plan active right ankle dorsiflexion and eversion, doing at least some of this in his recliner with legs elevated   Consulted and Agree  with Plan of Care Patient;Family member/caregiver        Problem List Patient Active Problem List   Diagnosis Date Noted  . Leukocytosis   . Pancreatic mass   . Abdominal pain 07/07/2015  . Pancreatitis 07/07/2015  . DVT (deep venous thrombosis) ([place])  04/19/2015  . Abdominal fluid collection   . Sepsis ([place]) 04/18/2015  . Intra-abdominal abscess ([place]) 04/18/2015  . Cognitive deficits 03/26/2015  . Thrombocytopenia ([place]) 03/25/2015  . Decubitus ulcer of buttock, stage 2 03/25/2015  . Acute encephalopathy 03/24/2015  . 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 Rash 03/05/2015  . Malnutrition of moderate degree ([place]) 02/05/2015  . Foot drop 02/03/2015  . 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 Backache 09/18/2009  . 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 KNEE PAIN, LEFT, CHRONIC 08/07/2008  . CAROTID BRUIT, LEFT 08/07/2008  . Allergic rhinitis 08/03/2007  . 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 GERD 02/18/2007  . BPH associated with nocturia 02/18/2007    Alexia Freestone 09/26/2015, 5:09 PM  [place], [place], [address] Phone: [phone]   Fax:  [phone]  Name: Perry Nielsen. MRN: [id number] Date of Birth: [birth year]    Allyson Sabal, PT 09/26/2015 5:09 PM

## 2015-09-30 ENCOUNTER — Ambulatory Visit: Payer: Medicare Other | Admitting: Physical Therapy

## 2015-09-30 DIAGNOSIS — R269 Unspecified abnormalities of gait and mobility: Secondary | ICD-10-CM | POA: Diagnosis not present

## 2015-09-30 DIAGNOSIS — R600 Localized edema: Secondary | ICD-10-CM

## 2015-09-30 DIAGNOSIS — M2141 Flat foot [pes planus] (acquired), right foot: Secondary | ICD-10-CM | POA: Diagnosis not present

## 2015-09-30 DIAGNOSIS — R29898 Other symptoms and signs involving the musculoskeletal system: Secondary | ICD-10-CM

## 2015-09-30 DIAGNOSIS — M79671 Pain in right foot: Secondary | ICD-10-CM | POA: Diagnosis not present

## 2015-09-30 NOTE — Patient Instructions (Signed)
Ankle Alphabet    Using left ankle and foot only, trace the letters of the alphabet. Perform A to Z. Repeat __1__ times per set. Do __1__ sets per session. Do __3__ sessions per day.  http://orth.exer.us/16   Copyright  VHI. All rights reserved.

## 2015-09-30 NOTE — Therapy (Signed)
[place], [place], [address] Phone: [phone]   Fax:  [phone]  Physical Therapy Treatment  Patient Details  Name: Perry Nielsen. MRN: [id number] Date of Birth: 1940/08/26 Referring Provider: Dr. Julieanne Manson  Encounter Date: 09/30/2015      PT End of Session - 09/30/15 2042    Visit Number 3   Number of Visits 18   Date for PT Re-Evaluation 11/08/15   PT Start Time [id number]  pt. late for appt. time   PT Stop Time 1355   PT Time Calculation (min) 46 min   Activity Tolerance Patient tolerated treatment well   Behavior During Therapy [facility] for tasks assessed/performed      Past Medical History  Diagnosis Date  . 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 ALLERGIC RHINITIS 08/03/2007  . GERD 02/18/2007  . KNEE PAIN, LEFT, CHRONIC 08/07/2008  . BACK PAIN 09/18/2009  . HAMMER TOE 08/03/2007  . CAROTID BRUIT, LEFT 08/07/2008  . 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 Foot drop     rt foot  . Rash, skin     torso  . Hepatitis 1977    no residual problems  . Sleeping difficulty   . Skin cancer   . Lymphoma [facility])     Past Surgical History  Procedure Laterality Date  . Shoulder open rotator cuff repair  07/06/05    right  . Knee arthroscopy      left  . Tonsillectomy and adenoidectomy    . Transurethral resection of prostate    . Cataract extraction w/ intraocular lens  implant, bilateral    . 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  Visit Diagnosis:  Edema of both legs  Pain in right foot  Weakness of foot, right      Subjective Assessment - 09/30/15 1310    Subjective Patient comes in with his foot bandaged but in a shoe that he has cut away.  He says the  foot hurts around midnight every night, then he tries different things to make it more comfortable.     Currently in Pain? Yes   Pain Score 6    Pain Location Foot   Pain Orientation Right   Pain Descriptors / Indicators Other (Comment)  "just a bit of discomfort"   Aggravating Factors  walking               LYMPHEDEMA/ONCOLOGY QUESTIONNAIRE - 09/30/15 1316    Right Lower Extremity Lymphedema   30 cm Proximal to Floor at Lateral Plantar Foot 32.5 cm   20 cm Proximal to Floor at Lateral Plantar Foot 27.2 1   10  cm Proximal to Floor at Lateral Malleoli 26.2 cm   5 cm Proximal to 1st MTP Joint 26.2 cm   Around Proximal Great Toe 9 cm   Other 40 cm. proximal to plantar foot 30                  [facility] Adult PT Treatment/Exercise - 09/30/15 0001    Self-Care   Self-Care Other Self-Care Comments   Other Self-Care Comments  Suggested to patient and his wife that they might consider buying some yoga socks (socks with toes in them like gloves)  to provide gentle compression including on each toe to be used either alone (like at night when he is uncomfortable and can't sleep) or for [place] to use when we wrap his toes.  Also suggested that the patient try to lie or be in his recliner 3 times a day for an hour or more with his legs elevated further, so above his trunk, to see if this helps swelling but also particularly his discomfort.   Manual Therapy   Manual Lymphatic Drainage (MLD) short neck, stimulation of right axillary nodes, established axillo-inguinal anastomosis, RLE working distally to R foot moving fluid laterally and towards pathway, revisiting axillo inguinal anastomosis and finished with axillary lymph nodes   Compression Bandaging TG soft from right foot to below knee, Elastomull to right toes, 1 roll of Artiflex on right foot going up to knee, used 8 cm bandage on foot and 2 -12 cm from ankle to knee                PT Education - 09/30/15 2042    Education  provided Yes   Education Details to lie down with legs elevated above level of trunk 3x/day; doing alphabet writing with his right foot (movement at ankle)   Person(s) Educated Patient;Spouse   Methods Explanation;Handout   Comprehension Verbalized understanding;Returned demonstration           Short Term Clinic Goals - 09/30/15 2047    CC Short Term Goal  #1   Title circumference at base of right great toe will be reduced to 9 cm.    Baseline 10.1 cm. at eval compared to 8.2 on the left, 3/23- 9.5cm ; 3/27, 9 cm.   Status Achieved   CC Short Term Goal  #2   Title circumference of right foot at 5 cm. proximal to metatarsal head will be reduced to 25. cm.   Baseline 26.2 cm. compared to 23.5 on the left, 3/23 - 25.7cm   Status On-going   CC Short Term Goal  #3   Title independent with home exercise program for right ankle strengthening   Status On-going             Long Term Clinic Goals - 09/24/15 1727    CC Long Term Goal  #1   Title circumference of right foot at 5 cm. proximal to metatarsal head reduced to 24.5 cm.   Baseline 26.2 cm. at eval compared to 23.5 on the left   Time 6   Period Weeks   Status New   CC Long Term Goal  #2   Title pain in right foot improved at least 50% in frequency and/or intensitiy   Time 6   Period Weeks   Status New   CC Long Term Goal  #3   Title lymphedema life impact scale reduced to 30% or less impairment   Baseline 57% at eval   Time 6   Period Weeks   Status New   CC Long Term Goal  #4   Title Pt. will be able to use an AFO to prevent foot drop and enable safe gait (or no AFO if not needed)   Baseline 6   Period Weeks   Status New            Plan - 09/30/15 2043    Clinical Impression Statement Overall, right leg circumferences are more or less the same as they were on evaluation, except at one level of calf.  Pt. left with his foot  and leg bandaged, reporting feeling more comfort in his foot that before.  He seems  not to have tried really elevating his leg recently, so this was recommended.   Pt will benefit from skilled therapeutic intervention in order to improve on the following deficits Increased edema;Pain;Decreased strength;Abnormal gait;Decreased activity tolerance;Decreased knowledge of use of DME;Decreased knowledge of precautions   Rehab Potential Good   PT Frequency 3x / week   PT Duration 6 weeks   PT Treatment/Interventions Manual lymph drainage;Compression bandaging;Patient/family education;Therapeutic exercise   PT Next Visit Plan continue MLD to RLE, bandaging from foot to below knee   PT Home Exercise Plan writing alphabet with his right foot 3x/day   Consulted and Agree with Plan of Care Patient        Problem List Patient Active Problem List   Diagnosis Date Noted  . Leukocytosis   . Pancreatic mass   . Abdominal pain 07/07/2015  . Pancreatitis 07/07/2015  . DVT (deep venous thrombosis) ([place]) 04/19/2015  . Abdominal fluid collection   . Sepsis ([place]) 04/18/2015  . Intra-abdominal abscess ([place]) 04/18/2015  . Cognitive deficits 03/26/2015  . Thrombocytopenia ([place]) 03/25/2015  . Decubitus ulcer of buttock, stage 2 03/25/2015  . Acute encephalopathy 03/24/2015  . 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 Rash 03/05/2015  . Malnutrition of moderate degree ([place]) 02/05/2015  . Foot drop 02/03/2015  . 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 Backache 09/18/2009  . 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 KNEE PAIN, LEFT, CHRONIC 08/07/2008  . CAROTID BRUIT, LEFT 08/07/2008  . Allergic rhinitis 08/03/2007  . 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 GERD 02/18/2007  . BPH associated with nocturia 02/18/2007    Arabela Basaldua 09/30/2015, 8:53 PM  [place], [place], [address] Phone: [phone]   Fax:  [phone]  Name: Perry Nielsen. MRN: [id number] Date of Birth:  08/25/1940    Serafina Royals, PT 09/30/2015 8:53 PM

## 2015-10-02 ENCOUNTER — Ambulatory Visit: Payer: Medicare Other | Admitting: Physical Therapy

## 2015-10-02 ENCOUNTER — Encounter: Payer: Self-pay | Admitting: Physical Therapy

## 2015-10-02 DIAGNOSIS — M2141 Flat foot [pes planus] (acquired), right foot: Secondary | ICD-10-CM | POA: Diagnosis not present

## 2015-10-02 DIAGNOSIS — M79671 Pain in right foot: Secondary | ICD-10-CM | POA: Diagnosis not present

## 2015-10-02 DIAGNOSIS — R600 Localized edema: Secondary | ICD-10-CM

## 2015-10-02 DIAGNOSIS — R269 Unspecified abnormalities of gait and mobility: Secondary | ICD-10-CM | POA: Diagnosis not present

## 2015-10-02 NOTE — Therapy (Signed)
[place], [place], [address] Phone: [phone]   Fax:  [phone]  Physical Therapy Treatment  Patient Details  Name: Perry Nielsen. MRN: [id number] Date of Birth: 1940/10/19 Referring Provider: Dr. Julieanne Manson  Encounter Date: 10/02/2015      PT End of Session - 10/02/15 1607    Visit Number 4   Number of Visits 18   Date for PT Re-Evaluation 11/08/15   PT Start Time 1522   PT Stop Time 1605   PT Time Calculation (min) 43 min   Activity Tolerance Patient tolerated treatment well   Behavior During Therapy [facility] for tasks assessed/performed      Past Medical History  Diagnosis Date  . 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 ALLERGIC RHINITIS 08/03/2007  . GERD 02/18/2007  . KNEE PAIN, LEFT, CHRONIC 08/07/2008  . BACK PAIN 09/18/2009  . HAMMER TOE 08/03/2007  . CAROTID BRUIT, LEFT 08/07/2008  . 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 Foot drop     rt foot  . Rash, skin     torso  . Hepatitis 1977    no residual problems  . Sleeping difficulty   . Skin cancer   . Lymphoma [facility])     Past Surgical History  Procedure Laterality Date  . Shoulder open rotator cuff repair  07/06/05    right  . Knee arthroscopy      left  . Tonsillectomy and adenoidectomy    . Transurethral resection of prostate    . Cataract extraction w/ intraocular lens  implant, bilateral    . 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  Visit Diagnosis:  Edema of both legs  Pain in right foot      Subjective Assessment - 10/02/15 1524    Subjective Patient attempted to purchased yoga socks but they were out of his size. Pt has difficulty with ankle ABCs. Pt states he has not been  elevating his feet above his heart. I was able to tolerate the bandages for 36 hours this last time.   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  Was diagnosed in early August.  Treatment has included radiation for 10 days and had 2 chemos; didn't tolerate the 6 that were planned.    Memory loss started about four weeks; trying to taper meds to ese if that helps.  Was healthy prior to this diagnosis.  Had blood clots in legs in October so is on Xarelto for six months.   Patient Stated Goals get help with drop foot   Currently in Pain? Yes   Pain Score 2    Pain Location Ankle   Pain Orientation Right   Pain Descriptors / Indicators Aching               LYMPHEDEMA/ONCOLOGY QUESTIONNAIRE - 10/02/15 1531    Right Lower Extremity Lymphedema   30 cm Proximal to Floor at Lateral Plantar Foot 30.8 cm   20 cm Proximal to Floor at Lateral Plantar Foot 27.5 1   10  cm Proximal to Floor at Lateral Malleoli 23 cm   5 cm Proximal to 1st MTP Joint 23.5 cm   Around Proximal Great  Toe 9 cm   Other 29.7                  [facility] Adult PT Treatment/Exercise - 10/02/15 0001    Manual Therapy   Manual Lymphatic Drainage (MLD) short neck, stimulation of right axillary nodes, established axillo-inguinal anastomosis, RLE working distally to R foot moving fluid laterally and towards pathway, revisiting axillo inguinal anastomosis and finished with axillary lymph nodes   Compression Bandaging TG soft from right foot to below knee, Elastomull to right toes, 1 roll of Artiflex on right foot going up to knee, used 8 cm bandage on foot and 2 -12 cm from ankle to knee                   Short Term Clinic Goals - 10/02/15 1610    CC Short Term Goal  #1   Title circumference at base of right great toe will be reduced to 9 cm.    Baseline 10.1 cm. at eval compared to 8.2 on the left, 3/23- 9.5cm ; 3/27, 9 cm.   Status Achieved    CC Short Term Goal  #2   Title circumference of right foot at 5 cm. proximal to metatarsal head will be reduced to 25. cm.   Baseline 26.2 cm. compared to 23.5 on the left, 3/23 - 25.7cm, 10/02/15- 23.5 cm   Status Achieved   CC Short Term Goal  #3   Title independent with home exercise program for right ankle strengthening   Status On-going             Long Term Clinic Goals - 09/24/15 1727    CC Long Term Goal  #1   Title circumference of right foot at 5 cm. proximal to metatarsal head reduced to 24.5 cm.   Baseline 26.2 cm. at eval compared to 23.5 on the left   Time 6   Period Weeks   Status New   CC Long Term Goal  #2   Title pain in right foot improved at least 50% in frequency and/or intensitiy   Time 6   Period Weeks   Status New   CC Long Term Goal  #3   Title lymphedema life impact scale reduced to 30% or less impairment   Baseline 57% at eval   Time 6   Period Weeks   Status New   CC Long Term Goal  #4   Title Pt. will be able to use an AFO to prevent foot drop and enable safe gait (or no AFO if not needed)   Baseline 6   Period Weeks   Status New            Plan - 10/02/15 1608    Clinical Impression Statement Pt demonstrates reduction of edema at right foot and lower leg. He demonstrates increased skin mobility and wrinkling as a result of decreased edema. He was able to keep bandages in place for 36 hours after last session. He plans on swimming over the weekend.    Pt will benefit from skilled therapeutic intervention in order to improve on the following deficits Increased edema;Pain;Decreased strength;Abnormal gait;Decreased activity tolerance;Decreased knowledge of use of DME;Decreased knowledge of precautions   Rehab Potential Good   PT Frequency 3x / week   PT Duration 6 weeks   PT Treatment/Interventions Manual lymph drainage;Compression bandaging;Patient/family education;Therapeutic exercise   PT Next Visit Plan continue MLD to RLE, bandaging  from foot to below knee   PT Home  Exercise Plan writing alphabet with his right foot 3x/day   Consulted and Agree with Plan of Care Patient        Problem List Patient Active Problem List   Diagnosis Date Noted  . Leukocytosis   . Pancreatic mass   . Abdominal pain 07/07/2015  . Pancreatitis 07/07/2015  . DVT (deep venous thrombosis) ([place]) 04/19/2015  . Abdominal fluid collection   . Sepsis ([place]) 04/18/2015  . Intra-abdominal abscess ([place]) 04/18/2015  . Cognitive deficits 03/26/2015  . Thrombocytopenia ([place]) 03/25/2015  . Decubitus ulcer of buttock, stage 2 03/25/2015  . Acute encephalopathy 03/24/2015  . 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 Rash 03/05/2015  . Malnutrition of moderate degree ([place]) 02/05/2015  . Foot drop 02/03/2015  . 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 Backache 09/18/2009  . 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 KNEE PAIN, LEFT, CHRONIC 08/07/2008  . CAROTID BRUIT, LEFT 08/07/2008  . Allergic rhinitis 08/03/2007  . 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 GERD 02/18/2007  . BPH associated with nocturia 02/18/2007    Alexia Freestone 10/02/2015, 5:08 PM  [place], [place], [address] Phone: [phone]   Fax:  [phone]  Name: Jahsean Cail. MRN: [id number] Date of Birth: 03/01/1941    Allyson Sabal, PT 10/02/2015 5:08 PM

## 2015-10-04 ENCOUNTER — Telehealth: Payer: Self-pay | Admitting: Oncology

## 2015-10-04 ENCOUNTER — Other Ambulatory Visit (HOSPITAL_BASED_OUTPATIENT_CLINIC_OR_DEPARTMENT_OTHER): Payer: Medicare Other

## 2015-10-04 ENCOUNTER — Other Ambulatory Visit: Payer: Self-pay | Admitting: *Deleted

## 2015-10-04 ENCOUNTER — Ambulatory Visit (HOSPITAL_BASED_OUTPATIENT_CLINIC_OR_DEPARTMENT_OTHER): Payer: Medicare Other | Admitting: Oncology

## 2015-10-04 VITALS — BP 99/64 | HR 97 | Temp 96.9°F | Resp 17 | Ht 72.0 in | Wt 153.5 lb

## 2015-10-04 DIAGNOSIS — R4182 Altered mental status, unspecified: Secondary | ICD-10-CM | POA: Diagnosis not present

## 2015-10-04 DIAGNOSIS — I4891 Unspecified atrial fibrillation: Secondary | ICD-10-CM | POA: Diagnosis not present

## 2015-10-04 DIAGNOSIS — C8580 Other specified types of non-Hodgkin lymphoma, unspecified site: Secondary | ICD-10-CM | POA: Diagnosis not present

## 2015-10-04 LAB — COMPREHENSIVE METABOLIC PANEL
ALBUMIN: 3.1 g/dL — AB (ref 3.5–5.0)
ALT: 33 U/L (ref 0–55)
ANION GAP: 7 meq/L (ref 3–11)
AST: 28 U/L (ref 5–34)
Alkaline Phosphatase: 67 U/L (ref 40–150)
BILIRUBIN TOTAL: 0.48 mg/dL (ref 0.20–1.20)
BUN: 12.8 mg/dL (ref 7.0–26.0)
CALCIUM: 9.3 mg/dL (ref 8.4–10.4)
CO2: 25 meq/L (ref 22–29)
CREATININE: 0.8 mg/dL (ref 0.7–1.3)
Chloride: 110 mEq/L — ABNORMAL HIGH (ref 98–109)
GLUCOSE: 77 mg/dL (ref 70–140)
Potassium: 3.9 mEq/L (ref 3.5–5.1)
Sodium: 143 mEq/L (ref 136–145)
TOTAL PROTEIN: 6 g/dL — AB (ref 6.4–8.3)

## 2015-10-04 LAB — LACTATE DEHYDROGENASE: LDH: 204 U/L (ref 125–245)

## 2015-10-04 MED ORDER — MIRTAZAPINE 7.5 MG PO TABS: ORAL_TABLET | ORAL | Status: DC

## 2015-10-04 NOTE — Telephone Encounter (Signed)
Opened in error

## 2015-10-04 NOTE — Telephone Encounter (Signed)
Per wife moved f/u from 4/21 to 4/20 due to patient has existing therapy appointment on 4/21. Spoke with Hinton Dyer at Dr. Doristine Devoid office re appointment and per Hinton Dyer - Dr. Carles Collet reviews all referral before scheduling. Per Hinton Dyer after review she will call patient directly with appointment. Re-routed referral to [facility] neurology [place] (Dr. Carles Collet). Gave wife avs report and appointments for April. Wife aware Dr. Doristine Devoid office will call re appointment.

## 2015-10-04 NOTE — Progress Notes (Signed)
[place] OFFICE PROGRESS NOTE   Diagnosis:  Non-Hodgkin's lymphoma  INTERVAL HISTORY:    Mr.  Nielsen returns as scheduled. He has a good appetite. He is participating in physical therapy. He has a lymphedema wrap on the right lower leg and foot. This has helped the swelling. He continues anticoagulation therapy. No bleeding.    he reports becoming emotional frequently. He is tearful when discussing friends and family members. His wife reports he has memory loss  and continues to be confused. He believes she is his second wife, though he was never married before. No focal neurologic symptoms.   Objective:  Vital signs in last 24 hours:  Blood pressure 99/64, pulse 97, temperature 96.9 F (36.1 C), temperature source Oral, resp. rate 17, height 6' (1.829 m), weight 153 lb 8 oz (69.627 kg), SpO2 97 %.    HEENT:  Neck without mass Lymphatics:  No cervical, supraclavicular , axillary, or inguinal nodes Resp:  Lungs clear bilaterally Cardio:  Regular rate and rhythm GI:  No hepatomegaly, nontender Vascular:  Trace to 1+ pitting edema at the left lower leg, lymphedema wrap in place at the right lower leg and foot Neuro: weakness with dorsi flexion at the right foot , alert, difficulty with memory recall, convinced his current wife is his second wife   tearful    Lab Results:   LDH 204, creatinine 0.8, calcium 9.3   Medications: I have reviewed the patient's current medications.  Assessment/Plan: 1. 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 History of thrombocytopenia predating chemotherapy 3. T7 compression fracture-likely pathologic 4. 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 Right foot pain-likely secondary to nerve compression from the sacral mass, improved 6. Diffuse erythematous rash, appearance consistent with a "drug" rash-resolved 7. History of low-grade fever likely secondary to atelectasis/postop fever versus tumor fever 8. Hepatitis B core antibody positive 10. Admission 04/18/2015 through 04/23/2015 presenting with fever; amylase and lipase elevated.   Abdominal CT showed inflammation in the left upper quadrant and associated loculated fluid collections status post drainage procedure, fluid with high levels of amylase and lipase; cultures negative; question pancreatitis.   04/26/2015 lipase mildly elevated, amylase normal.   CT abdomen/pelvis 05/14/2015 with a persistent 6.5 x 3.8 cm posterior pancreatic tail fluid collection similar in size to the 04/18/2015 CT study. Additional smaller fluid collections in the far lateral left upper quadrant; extensive patchy groundglass opacity at the lung bases, significantly increased; stable expansile lytic lesion in the upper right sacrum. 11. Bilateral lower extremity DVTs on a Doppler 04/18/2015, now on Lovenox. 12. Severe neutropenia 05/13/2015. Likely delayed effect of Rituxan. Resolved 13. Chest x-ray 05/15/2015 with bilateral mid to lower lung interstitial thickening with some hazy lower lung zone groundglass opacity. Partially improved on the chest x-ray 05/24/2015 14. New-onset rapid atrial fibrillation  05/29/2015 15. Admission with acute onset upper abdomen/low anterior chest pain 07/07/2015-most  likely related to Neulasta, spontaneously resolved 16. Altered mental status-  Potentially related to persistent delirium following a prolonged critical illness , depression, dementia, or psychosis.   Disposition:   Mr. Jungwirth remains in clinical remission from non-Hodgkin's lymphoma. He has memory loss, confusion, and increased emotions. I have a low clinical suspicion for CNS progression of the lymphoma. He will begin a trial of Remeron. We made a referral to neurology for further assessment of his mental status.   He will continue physical therapy. Mr. Solum will return for an office visit in 3 weeks.  Betsy Coder, MD  10/04/2015  10:04 AM

## 2015-10-07 ENCOUNTER — Encounter: Payer: Self-pay | Admitting: Physical Therapy

## 2015-10-07 ENCOUNTER — Ambulatory Visit: Payer: Medicare Other | Attending: Oncology | Admitting: Physical Therapy

## 2015-10-07 DIAGNOSIS — M6281 Muscle weakness (generalized): Secondary | ICD-10-CM | POA: Insufficient documentation

## 2015-10-07 DIAGNOSIS — M79671 Pain in right foot: Secondary | ICD-10-CM

## 2015-10-07 DIAGNOSIS — R29898 Other symptoms and signs involving the musculoskeletal system: Secondary | ICD-10-CM

## 2015-10-07 DIAGNOSIS — M25571 Pain in right ankle and joints of right foot: Secondary | ICD-10-CM | POA: Diagnosis not present

## 2015-10-07 DIAGNOSIS — R609 Edema, unspecified: Secondary | ICD-10-CM | POA: Insufficient documentation

## 2015-10-07 DIAGNOSIS — R600 Localized edema: Secondary | ICD-10-CM | POA: Diagnosis not present

## 2015-10-07 DIAGNOSIS — M2141 Flat foot [pes planus] (acquired), right foot: Secondary | ICD-10-CM | POA: Insufficient documentation

## 2015-10-07 NOTE — Therapy (Signed)
[place], [place], [address] Phone: [phone]   Fax:  [phone]  Physical Therapy Treatment  Patient Details  Name: Perry Nielsen. MRN: [id number] Date of Birth: [birth year] Referring Provider: Dr. Julieanne Manson  Encounter Date: 10/07/2015      PT End of Session - 10/07/15 1530    Visit Number 5   Number of Visits 18   Date for PT Re-Evaluation 11/08/15   PT Start Time 1440   PT Stop Time 1524   PT Time Calculation (min) 44 min   Activity Tolerance Patient tolerated treatment well   Behavior During Therapy [facility] for tasks assessed/performed      Past Medical History  Diagnosis Date  . 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 ALLERGIC RHINITIS 08/03/2007  . GERD 02/18/2007  . KNEE PAIN, LEFT, CHRONIC 08/07/2008  . BACK PAIN 09/18/2009  . HAMMER TOE 08/03/2007  . CAROTID BRUIT, LEFT 08/07/2008  . 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 Foot drop     rt foot  . Rash, skin     torso  . Hepatitis 1977    no residual problems  . Sleeping difficulty   . Skin cancer   . Lymphoma [facility])     Past Surgical History  Procedure Laterality Date  . Shoulder open rotator cuff repair  07/06/05    right  . Knee arthroscopy      left  . Tonsillectomy and adenoidectomy    . Transurethral resection of prostate    . Cataract extraction w/ intraocular lens  implant, bilateral    . 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  Visit Diagnosis:  Edema of both legs  Pain in right foot  Weakness of foot, right      Subjective Assessment - 10/07/15 1442    Subjective I left the bandages on for several days longer. I had to take some of it off because the middle of my foot hurt. Pt  reports he fell getting in the car to come here. He fell backwards on his bottom. He states the wind got him off balance.    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  Was diagnosed in early August.  Treatment has included radiation for 10 days and had 2 chemos; didn't tolerate the 6 that were planned.    Memory loss started about four weeks; trying to taper meds to ese if that helps.  Was healthy prior to this diagnosis.  Had blood clots in legs in October so is on Xarelto for six months.   Patient Stated Goals get help with drop foot   Currently in Pain? Yes   Pain Score 3    Pain Location Foot   Pain Orientation Right   Pain Descriptors / Indicators Aching                         [facility] Adult PT Treatment/Exercise - 10/07/15 0001    Manual Therapy   Manual Lymphatic Drainage (MLD) short neck, superficial and deep abdominals, stimulation of right axillary nodes, established axillo-inguinal anastomosis, RLE working distally to R  foot moving fluid laterally and towards pathway, revisiting axillo inguinal anastomosis and finished with axillay lymph nodes   Compression Bandaging leg and foot were washed with soap and water, TG soft from right foot to below knee, Elastomull to right toes, 1 roll of Artiflex on right foot going up to knee, used 8 cm bandage on foot and 2 -12 cm from ankle to knee                   Short Term Clinic Goals - 10/02/15 1610    CC Short Term Goal  #1   Title circumference at base of right great toe will be reduced to 9 cm.    Baseline 10.1 cm. at eval compared to 8.2 on the left, 3/23- 9.5cm ; 3/27, 9 cm.   Status Achieved   CC Short Term Goal  #2   Title circumference of right foot at 5 cm. proximal to metatarsal head will be reduced to 25. cm.   Baseline 26.2 cm. compared to 23.5 on the left, 3/23 - 25.7cm, 10/02/15- 23.5 cm    Status Achieved   CC Short Term Goal  #3   Title independent with home exercise program for right ankle strengthening   Status On-going             Long Term Clinic Goals - 09/24/15 1727    CC Long Term Goal  #1   Title circumference of right foot at 5 cm. proximal to metatarsal head reduced to 24.5 cm.   Baseline 26.2 cm. at eval compared to 23.5 on the left   Time 6   Period Weeks   Status New   CC Long Term Goal  #2   Title pain in right foot improved at least 50% in frequency and/or intensitiy   Time 6   Period Weeks   Status New   CC Long Term Goal  #3   Title lymphedema life impact scale reduced to 30% or less impairment   Baseline 57% at eval   Time 6   Period Weeks   Status New   CC Long Term Goal  #4   Title Pt. will be able to use an AFO to prevent foot drop and enable safe gait (or no AFO if not needed)   Baseline 6   Period Weeks   Status New            Plan - 10/07/15 1531    Clinical Impression Statement Pt left toe bandages intact since last session and was educated about removing all bandages after 3 days. Pt is demonstrating reduction of edema and his toes were significantly reduced this visit. He was unable to swim over the weekend due to an event at the pool.    Pt will benefit from skilled therapeutic intervention in order to improve on the following deficits Increased edema;Pain;Decreased strength;Abnormal gait;Decreased activity tolerance;Decreased knowledge of use of DME;Decreased knowledge of precautions   Rehab Potential Good   PT Frequency 3x / week   PT Duration 6 weeks   PT Treatment/Interventions Manual lymph drainage;Compression bandaging;Patient/family education;Therapeutic exercise   PT Next Visit Plan continue MLD to RLE, bandaging from foot to below knee, assess measurements- send facesheet to [employer] to check for benefits?   Consulted and Agree with Plan of Care Patient        Problem List Patient Active Problem List    Diagnosis Date Noted  . Leukocytosis   . Pancreatic mass   . Abdominal  pain 07/07/2015  . Pancreatitis 07/07/2015  . DVT (deep venous thrombosis) ([place]) 04/19/2015  . Abdominal fluid collection   . Sepsis ([place]) 04/18/2015  . Intra-abdominal abscess ([place]) 04/18/2015  . Cognitive deficits 03/26/2015  . Thrombocytopenia ([place]) 03/25/2015  . Decubitus ulcer of buttock, stage 2 03/25/2015  . Acute encephalopathy 03/24/2015  . 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 Rash 03/05/2015  . Malnutrition of moderate degree ([place]) 02/05/2015  . Foot drop 02/03/2015  . 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 Backache 09/18/2009  . 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 KNEE PAIN, LEFT, CHRONIC 08/07/2008  . CAROTID BRUIT, LEFT 08/07/2008  . Allergic rhinitis 08/03/2007  . 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 GERD 02/18/2007  . BPH associated with nocturia 02/18/2007    Alexia Freestone 10/07/2015, 3:37 PM  [place], [place], [address] Phone: [phone]   Fax:  [phone]  Name: Detroit Fadeley. MRN: [id number] Date of Birth: 27-Oct-1940    Allyson Sabal, PT 10/07/2015 3:37 PM

## 2015-10-09 ENCOUNTER — Telehealth: Payer: Self-pay | Admitting: Oncology

## 2015-10-09 ENCOUNTER — Ambulatory Visit: Payer: Medicare Other | Admitting: Physical Therapy

## 2015-10-09 DIAGNOSIS — M25571 Pain in right ankle and joints of right foot: Secondary | ICD-10-CM

## 2015-10-09 DIAGNOSIS — M79671 Pain in right foot: Secondary | ICD-10-CM | POA: Diagnosis not present

## 2015-10-09 DIAGNOSIS — M6281 Muscle weakness (generalized): Secondary | ICD-10-CM | POA: Diagnosis not present

## 2015-10-09 DIAGNOSIS — R600 Localized edema: Secondary | ICD-10-CM | POA: Diagnosis not present

## 2015-10-09 DIAGNOSIS — R609 Edema, unspecified: Secondary | ICD-10-CM

## 2015-10-09 DIAGNOSIS — M2141 Flat foot [pes planus] (acquired), right foot: Secondary | ICD-10-CM | POA: Diagnosis not present

## 2015-10-09 NOTE — Therapy (Signed)
[place], [place], [address] Phone: [phone]   Fax:  [phone]  Physical Therapy Treatment  Patient Details  Name: Perry Nielsen. MRN: [id number] Date of Birth: 1941/03/29 Referring Provider: Dr. Julieanne Manson  Encounter Date: 10/09/2015      PT End of Session - 10/09/15 1720    Visit Number 6   Number of Visits 18   Date for PT Re-Evaluation 11/08/15   PT Start Time 1509   PT Stop Time 1608   PT Time Calculation (min) 59 min   Activity Tolerance Patient tolerated treatment well   Behavior During Therapy [facility] for tasks assessed/performed      Past Medical History  Diagnosis Date  . 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 ALLERGIC RHINITIS 08/03/2007  . GERD 02/18/2007  . KNEE PAIN, LEFT, CHRONIC 08/07/2008  . BACK PAIN 09/18/2009  . HAMMER TOE 08/03/2007  . CAROTID BRUIT, LEFT 08/07/2008  . 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 Foot drop     rt foot  . Rash, skin     torso  . Hepatitis 1977    no residual problems  . Sleeping difficulty   . Skin cancer   . Lymphoma [facility])     Past Surgical History  Procedure Laterality Date  . Shoulder open rotator cuff repair  07/06/05    right  . Knee arthroscopy      left  . Tonsillectomy and adenoidectomy    . Transurethral resection of prostate    . Cataract extraction w/ intraocular lens  implant, bilateral    . 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  Visit Diagnosis:  Edema, unspecified  Pain in right ankle and joints of right foot  Muscle weakness (generalized)      Subjective Assessment - 10/09/15 1517    Subjective About the same today.  Still a little pain from the bandages, but kept the bandages  on.   Currently in Pain? Yes   Pain Score 2    Pain Location Foot   Pain Orientation Right   Pain Descriptors / Indicators --  "just a little uncomfortable   Aggravating Factors  pressure toward the ball of the foot with walking   Pain Relieving Factors flat foot               LYMPHEDEMA/ONCOLOGY QUESTIONNAIRE - 10/09/15 1525    Right Lower Extremity Lymphedema   30 cm Proximal to Floor at Lateral Plantar Foot 29.6 cm   20 cm Proximal to Floor at Lateral Plantar Foot 26 1   10  cm Proximal to Floor at Lateral Malleoli 24.3 cm   5 cm Proximal to 1st MTP Joint 24 cm   Around Proximal Great Toe 8.2 cm                  [facility] Adult PT Treatment/Exercise - 10/09/15 0001    Self-Care   Other Self-Care Comments  showed patient and his wife a donning butler and a slippy gator and explained their function; discussed some ideas about compression garments going forward, both day and night   Manual Therapy   Manual Therapy Edema management   Edema Management circumference measurements taken   Manual Lymphatic  Drainage (MLD) short neck, right axilla and inguino-axillary anastomosis, and right LE from foot to lateral hip   Compression Bandaging leg and foot were washed with soap and water, TG soft from right foot to below knee, Elastomull to right toes, 1 roll of Artiflex on right foot going up to knee, used 8 cm bandage on foot and 2 -12 cm from ankle to knee                   Short Term Clinic Goals - 10/09/15 1725    CC Short Term Goal  #3   Title independent with home exercise program for right ankle strengthening   Status On-going             Long Term Clinic Goals - 10/09/15 1726    CC Long Term Goal  #1   Title circumference of right foot at 5 cm. proximal to metatarsal head reduced to 24.5 cm.   Status On-going   CC Long Term Goal  #2   Title pain in right foot improved at least 50% in frequency and/or intensitiy   Status On-going   CC Long Term  Goal  #3   Title lymphedema life impact scale reduced to 30% or less impairment   Status On-going   CC Long Term Goal  #4   Title Pt. will be able to use an AFO to prevent foot drop and enable safe gait (or no AFO if not needed)   Status On-going            Plan - 10/09/15 1720    Clinical Impression Statement Patient is doing better with keeping bandages on, with some discomfort, but not a lot.  Foot and leg look much better; great toe measurement was the main improvement today.       Pt will benefit from skilled therapeutic intervention in order to improve on the following deficits Increased edema;Pain;Decreased strength;Abnormal gait;Decreased activity tolerance;Decreased knowledge of use of DME;Decreased knowledge of precautions   Rehab Potential Good   PT Frequency 3x / week   PT Duration 6 weeks   PT Treatment/Interventions Manual lymph drainage;Compression bandaging;Patient/family education;Therapeutic exercise;ADLs/Self Care Home Management   PT Next Visit Plan Pt. and his wife were asked to obtain yoga socks to be able to try these at next visit; they were also asked to bring in the compression socks that he had purchased previously so that we could see how they look and how they fit.  We might also be able to try to donning butler and/or slippy gator with these to see if patient thinks one or the other would work for them going forward.  He may need different compression socks, but therapist wanted to start by looking at what he has.  (They could not get them on in the past.)  Continue MLD to Rt. LE, bandaging from foot to below knee, assess measurements- send facesheet to [employer] to check for benefits?   PT Home Exercise Plan writing alphabet with his right foot 3x/day   Consulted and Agree with Plan of Care Patient        Problem List Patient Active Problem List   Diagnosis Date Noted  . Leukocytosis   . Pancreatic mass   . Abdominal pain 07/07/2015  . Pancreatitis  07/07/2015  . DVT (deep venous thrombosis) ([place]) 04/19/2015  . Abdominal fluid collection   . Sepsis ([place]) 04/18/2015  . Intra-abdominal abscess ([place]) 04/18/2015  . Cognitive deficits 03/26/2015  . Thrombocytopenia (  [place]) 03/25/2015  . Decubitus ulcer of buttock, stage 2 03/25/2015  . Acute encephalopathy 03/24/2015  . 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 Rash 03/05/2015  . Malnutrition of moderate degree ([place]) 02/05/2015  . Foot drop 02/03/2015  . 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 Backache 09/18/2009  . 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 KNEE PAIN, LEFT, CHRONIC 08/07/2008  . CAROTID BRUIT, LEFT 08/07/2008  . Allergic rhinitis 08/03/2007  . 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 GERD 02/18/2007  . BPH associated with nocturia 02/18/2007    SALISBURY,DONNA 10/09/2015, 5:27 PM  [place], [place], [address] Phone: [phone]   Fax:  [phone]  Name: Perry Nielsen. MRN: [id number] Date of Birth: 1940/10/12    Serafina Royals, PT 10/09/2015 5:27 PM

## 2015-10-09 NOTE — Telephone Encounter (Signed)
Faxed records to [place] [facility] for PT [phone]  Release id)

## 2015-10-11 ENCOUNTER — Ambulatory Visit: Payer: Medicare Other | Admitting: Physical Therapy

## 2015-10-11 ENCOUNTER — Encounter: Payer: Self-pay | Admitting: Physical Therapy

## 2015-10-11 DIAGNOSIS — R600 Localized edema: Secondary | ICD-10-CM | POA: Diagnosis not present

## 2015-10-11 DIAGNOSIS — M6281 Muscle weakness (generalized): Secondary | ICD-10-CM | POA: Diagnosis not present

## 2015-10-11 DIAGNOSIS — R609 Edema, unspecified: Secondary | ICD-10-CM | POA: Diagnosis not present

## 2015-10-11 DIAGNOSIS — M2141 Flat foot [pes planus] (acquired), right foot: Secondary | ICD-10-CM | POA: Diagnosis not present

## 2015-10-11 DIAGNOSIS — M79671 Pain in right foot: Secondary | ICD-10-CM | POA: Diagnosis not present

## 2015-10-11 DIAGNOSIS — M25571 Pain in right ankle and joints of right foot: Secondary | ICD-10-CM

## 2015-10-11 NOTE — Therapy (Signed)
[place], [place], [address] Phone: [phone]   Fax:  [phone]  Physical Therapy Treatment  Patient Details  Name: Perry Nielsen. MRN: [id number] Date of Birth: 01-Aug-1940 Referring Provider: Dr. Julieanne Manson  Encounter Date: 10/11/2015      PT End of Session - 10/11/15 1213    Visit Number 7   Number of Visits 18   Date for PT Re-Evaluation 11/08/15   PT Start Time 1019   PT Stop Time 1110   PT Time Calculation (min) 51 min   Activity Tolerance Patient tolerated treatment well   Behavior During Therapy [facility] for tasks assessed/performed      Past Medical History  Diagnosis Date  . 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 ALLERGIC RHINITIS 08/03/2007  . GERD 02/18/2007  . KNEE PAIN, LEFT, CHRONIC 08/07/2008  . BACK PAIN 09/18/2009  . HAMMER TOE 08/03/2007  . CAROTID BRUIT, LEFT 08/07/2008  . 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 Foot drop     rt foot  . Rash, skin     torso  . Hepatitis 1977    no residual problems  . Sleeping difficulty   . Skin cancer   . Lymphoma [facility])     Past Surgical History  Procedure Laterality Date  . Shoulder open rotator cuff repair  07/06/05    right  . Knee arthroscopy      left  . Tonsillectomy and adenoidectomy    . Transurethral resection of prostate    . Cataract extraction w/ intraocular lens  implant, bilateral    . 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      Subjective Assessment - 10/11/15 1022    Subjective Pt arrived with bandages intact since Wednesday. He states he did not sleep well last night. Pt says bandages are uncomfortable but he didn't need to remove them.   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  Was diagnosed in early August.  Treatment has included radiation for 10 days and had 2 chemos; didn't tolerate the 6 that were planned.    Memory loss started about four weeks; trying to taper meds to ese if that helps.  Was healthy prior to this diagnosis.  Had blood clots in legs in October so is on Xarelto for six months.   Patient Stated Goals get help with drop foot   Currently in Pain? No/denies   Pain Score 0-No pain               LYMPHEDEMA/ONCOLOGY QUESTIONNAIRE - 10/11/15 1024    Right Lower Extremity Lymphedema   30 cm Proximal to Floor at Lateral Plantar Foot (p) 27.6 cm   20 cm Proximal to Floor at Lateral Plantar Foot (p) 24 1   10  cm Proximal to Floor at Lateral Malleoli (p) 21.9 cm   5 cm Proximal to 1st MTP Joint (p) 21 cm   Around Proximal Great Toe (p) 8.6 cm                  [facility] Adult PT Treatment/Exercise - 10/11/15 0001    Manual Therapy  Manual Therapy Edema management   Edema Management circumference measurements taken   Manual Lymphatic Drainage (MLD) short neck, superficial and deep abdominals, stimulation of right axillary nodes, established axillo-inguinal anastomosis, RLE working distally to R foot moving fluid laterally and towards pathway, revisiting axillo inguinal anastomosis and finished with axillay lymph nodes   Compression Bandaging leg and foot were washed with soap and water, TG soft from right foot to below knee, Elastomull to right toes, 1 roll of Artiflex on right foot going up to knee, used 8 cm bandage on foot and 2 -12 cm from ankle to knee                   Short Term Clinic Goals - 10/11/15 1217    CC Short Term Goal  #1   Title circumference at base of right great toe will be reduced to 9 cm.    Baseline 10.1 cm. at eval compared to 8.2 on the left, 3/23- 9.5cm ; 3/27, 9 cm.   Status  Achieved   CC Short Term Goal  #2   Title circumference of right foot at 5 cm. proximal to metatarsal head will be reduced to 25. cm.   Baseline 26.2 cm. compared to 23.5 on the left, 3/23 - 25.7cm, 10/02/15- 23.5 cm   Status Achieved   CC Short Term Goal  #3   Title independent with home exercise program for right ankle strengthening   Status On-going             Long Term Clinic Goals - 10/11/15 1218    CC Long Term Goal  #1   Title circumference of right foot at 5 cm. proximal to metatarsal head reduced to 24.5 cm.   Baseline 26.2 cm. at eval compared to 23.5 on the left, 10/11/15- 21 cm   Status Achieved   CC Long Term Goal  #2   Title pain in right foot improved at least 50% in frequency and/or intensitiy   Status On-going   CC Long Term Goal  #3   Title lymphedema life impact scale reduced to 30% or less impairment   Status On-going   CC Long Term Goal  #4   Title Pt. will be able to use an AFO to prevent foot drop and enable safe gait (or no AFO if not needed)   Status On-going            Plan - 10/11/15 1214    Clinical Impression Statement Patient was able to leave bandages intact since last session. He demonstrates tremendous decrease in circumferential measurements today and is approaching time to be measured for a compression garment. Increased skin wrinkling noted. Pt reports he has been more compliant with his exercises.    Rehab Potential Good   PT Frequency 3x / week   PT Duration 6 weeks   PT Treatment/Interventions Manual lymph drainage;Compression bandaging;Patient/family education;Therapeutic exercise;ADLs/Self Care Home Management   PT Next Visit Plan ask pt to bring in compression garments, begin more ankle strengthening exercises   Consulted and Agree with Plan of Care Patient      Patient will benefit from skilled therapeutic intervention in order to improve the following deficits and impairments:  Increased edema, Pain, Decreased strength,  Abnormal gait, Decreased activity tolerance, Decreased knowledge of use of DME, Decreased knowledge of precautions  Visit Diagnosis: Edema, unspecified  Pain in right ankle and joints of right foot  Edema of both legs  Pain in right foot  Problem List Patient Active Problem List   Diagnosis Date Noted  . Leukocytosis   . Pancreatic mass   . Abdominal pain 07/07/2015  . Pancreatitis 07/07/2015  . DVT (deep venous thrombosis) ([place]) 04/19/2015  . Abdominal fluid collection   . Sepsis ([place]) 04/18/2015  . Intra-abdominal abscess ([place]) 04/18/2015  . Cognitive deficits 03/26/2015  . Thrombocytopenia ([place]) 03/25/2015  . Decubitus ulcer of buttock, stage 2 03/25/2015  . Acute encephalopathy 03/24/2015  . 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 Rash 03/05/2015  . Malnutrition of moderate degree ([place]) 02/05/2015  . Foot drop 02/03/2015  . 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 Backache 09/18/2009  . 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 KNEE PAIN, LEFT, CHRONIC 08/07/2008  . CAROTID BRUIT, LEFT 08/07/2008  . Allergic rhinitis 08/03/2007  . 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 GERD 02/18/2007  . BPH associated with nocturia 02/18/2007    Alexia Freestone 10/11/2015, 12:20 PM  [place], [place], [address] Phone: [phone]   Fax:  [phone]  Name: Perry Nielsen. MRN: [id number] Date of Birth: 02-22-1941    Allyson Sabal, PT 10/11/2015 12:21 PM

## 2015-10-14 ENCOUNTER — Ambulatory Visit: Payer: Medicare Other | Admitting: Physical Therapy

## 2015-10-14 ENCOUNTER — Encounter: Payer: Self-pay | Admitting: Neurology

## 2015-10-14 ENCOUNTER — Ambulatory Visit (INDEPENDENT_AMBULATORY_CARE_PROVIDER_SITE_OTHER): Payer: Medicare Other | Admitting: Neurology

## 2015-10-14 VITALS — BP 148/70 | HR 92 | Ht 72.0 in | Wt 154.0 lb

## 2015-10-14 DIAGNOSIS — F482 Pseudobulbar affect: Secondary | ICD-10-CM

## 2015-10-14 DIAGNOSIS — Z8579 Personal history of other malignant neoplasms of lymphoid, hematopoietic and related tissues: Secondary | ICD-10-CM | POA: Diagnosis not present

## 2015-10-14 DIAGNOSIS — R410 Disorientation, unspecified: Secondary | ICD-10-CM

## 2015-10-14 DIAGNOSIS — M6281 Muscle weakness (generalized): Secondary | ICD-10-CM

## 2015-10-14 DIAGNOSIS — M25571 Pain in right ankle and joints of right foot: Secondary | ICD-10-CM

## 2015-10-14 DIAGNOSIS — M21371 Foot drop, right foot: Secondary | ICD-10-CM

## 2015-10-14 DIAGNOSIS — R609 Edema, unspecified: Secondary | ICD-10-CM

## 2015-10-14 DIAGNOSIS — G9340 Encephalopathy, unspecified: Secondary | ICD-10-CM | POA: Diagnosis not present

## 2015-10-14 NOTE — Therapy (Signed)
[place], [place], [address] Phone: [phone]   Fax:  [phone]  Patient Details  Name: Perry Nielsen. MRN: [id number] Date of Birth: 07/30/1940 Referring Provider:  Ladell Pier, MD  Encounter Date: 10/14/2015   Serafina Royals 10/14/2015, 11:17 PM  [place], [place], [address] Phone: [phone]   Fax:  [place], PT 10/14/2015 11:18 PM

## 2015-10-14 NOTE — Patient Instructions (Addendum)
1. 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 2. We will call with appt for neuro cognitive evaluation. 3. We have scheduled you at [facility] for your MRI on 10/22/15 at 5:00 pm. Please arrive 15 minutes prior and [place]. If you need to reschedule for any reason please call [phone].

## 2015-10-14 NOTE — Progress Notes (Signed)
The patient is seen in neurologic consultation at the request of Dr. Learta Codding for the evaluation of memory and "emotional changes" per the patients words.  The patient is accompanied by his wife who supplements the history.  I have reviewed numerous records that are made available to me.  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 Learta Codding for this.  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  In regards to the confusion for which the patient is here to be evaluated, this is not an acute problem according to records.  In fact, the patient was hospitalized in September, 2016 and had confusion at that time.  He had a head CT for this indication and subsequently had an MRI with and without gadolinium for this as well.  I reviewed both of these.  There was mild small vessel disease, mild atrophy and diffusion weighted imaging was negative.  The post-gadolinium images had significant amount of motion artifact.  She had an EEG on 03/25/2015 that was slow.  Psychiatry saw him while he was in the hospital and it appears unclear if this represented delirium versus dementia.  Psychiatry did suggest that he may need neuropsych testing after he got out of the hospital.  Pt states that "I became emotional when I was diagnosed with cancer in 02/2015 and people would call and state that they were so sorry."    Wife states that memory issues started in 08/2015 and she denies any issues prior to that but when I brought up the September issue, she states that hydrocodone caused that.  Wife states that this time, he became confused and didn't know where he was and didn't know where his "first wife" was (didn't have a first wife) and had trouble placing other relatives.  The cymbalta was d/c and it didn't help.  The dilaudid and ativan were weaned and d/c and it didn't help.  His wife states that a lot of times he will talk to her about his wife,  thinking that she is the caregiver.  Memory seems to fluctuate a little bit.  The patient does not do the finances in the home but never has.  The patient does not drive and hasn't since the foot drop.    The patient does not cook.     The patient is able to perform his own ADL's.  The patient is not able to distribute his own medications; his wife has distributed meds since 02/2015 when he was dx with CA.  The patients bladder and bowel are  under good control.  There have been no behavioral changes over the years.  There have been no hallucinations either visual or auditory.  No OTC meds or supplements.    Allergies  Allergen Reactions  . Antihistamines, Diphenhydramine-Type     CAUSES PROSTATE TO SWELL & as a child had hyperactivity when took Benadryl Pt took Pepcid w/ Benadryl PO and did fine per Pt & his wife    Current Outpatient Prescriptions on File Prior to Visit  Medication Sig Dispense Refill  . famotidine (PEPCID) 20 MG tablet Take 20 mg by mouth daily.     . feeding supplement, ENSURE ENLIVE, (ENSURE ENLIVE) LIQD Take 237 mLs by mouth 2 (two) times daily between meals. (Patient taking differently: Take 237 mLs by mouth 2 (two) times daily between meals. Ensure Clear) 237 mL 12  . ibuprofen (ADVIL,MOTRIN) 200 MG tablet  Take 400 mg by mouth every 4 (four) hours as needed for fever, headache, mild pain, moderate pain or cramping.    Marland Kitchen LORazepam (ATIVAN) 0.5 MG tablet Take 1 tablet (0.5 mg total) by mouth 2 (two) times daily as needed for anxiety. (Patient taking differently: Take 0.5 mg by mouth every morning. Take 1 mg at bedtime.) 100 tablet 5  . mirtazapine (REMERON) 7.5 MG tablet Take 2 tablets total 15 mg at bedtime. 60 tablet 3  . oxymetazoline (AFRIN) 0.05 % nasal spray Place 1 spray into both nostrils 2 (two) times daily as needed for congestion.    Vladimir Faster Glycol-Propyl Glycol (SYSTANE OP) Apply 1 drop to eye daily as needed (dryness).    . polyethylene glycol (MIRALAX /  GLYCOLAX) packet Take 17 g by mouth daily as needed. 14 each 0  . rivaroxaban (XARELTO) 20 MG TABS tablet Take 1 tablet (20 mg total) by mouth daily with supper. 90 tablet 0   No current facility-administered medications on file prior to visit.    Past Medical History  Diagnosis Date  . 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 ALLERGIC RHINITIS 08/03/2007  . GERD 02/18/2007  . KNEE PAIN, LEFT, CHRONIC 08/07/2008  . BACK PAIN 09/18/2009  . HAMMER TOE 08/03/2007  . CAROTID BRUIT, LEFT 08/07/2008  . 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 Foot drop     rt foot  . Rash, skin     torso  . Hepatitis 1977    no residual problems  . Sleeping difficulty   . Skin cancer   . Lymphoma [facility])     Past Surgical History  Procedure Laterality Date  . Shoulder open rotator cuff repair  07/06/05    right  . Knee arthroscopy      left  . Tonsillectomy and adenoidectomy    . Transurethral resection of prostate    . Cataract extraction w/ intraocular lens  implant, bilateral Bilateral   . 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 Marital Status: Married    Spouse Name: N/A  . Number of Children: N/A  . Years of Education: N/A   Occupational History  . Not on file.   Social History Main Topics  . Smoking status: Never Smoker   . Smokeless tobacco: Not on file  . Alcohol Use: No     Comment: rarely   . Drug Use: No  . Sexual Activity: Not on file   Other Topics Concern  . Not on file   Social History Narrative   Married.  Very active.      Family Status  Relation Status Death Age  . Mother Deceased 18    Pulmonary fibrosis, uterine cancer  . Father Deceased 66    Prostate cancer  . Sister Deceased 24    Heart disease  . Brother Alive     heart disease,  orthopaedic issues  . Sister Alive     healthy    ROS:  A complete 10 system ROS was obtained and was unremarkable except as above.   VITALS:   Filed Vitals:   10/14/15 1259  BP: 148/70  Pulse: 92  Height: 6' (1.829 m)  Weight: 154 lb (69.854 kg)   Gen:  There is tearfulness easily  and then he will talk without any problem.   HEENT:  Normocephalic, atraumatic. The mucous membranes are moist. The superficial temporal arteries are without ropiness or tenderness. Cardiovascular: Regular rate and rhythm. Lungs: Clear to auscultation bilaterally. Neck: There are no carotid bruits noted bilaterally.  NEUROLOGICAL:  Orientation:   Montreal Cognitive Assessment  10/14/2015  Visuospatial/ Executive (0/5) 2  Naming (0/3) 3  Attention: Read list of digits (0/2) 2  Attention: Read list of letters (0/1) 1  Attention: Serial 7 subtraction starting at 100 (0/3) 3  Language: Repeat phrase (0/2) 2  Language : Fluency (0/1) 1  Abstraction (0/2) 2  Delayed Recall (0/5) 0  Orientation (0/6) 5  Total 21  Adjusted Score (based on education) 21   Cranial nerves: There is good facial symmetry. The pupils are equal round and reactive to light bilaterally. Funduscopic exam reveals clear disc margins bilaterally. Extraocular muscles are intact and visual fields are full to confrontational testing. Speech is fluent and clear. Soft palate rises symmetrically and there is no tongue deviation. Hearing is intact to conversational tone. Tone: Tone is good throughout. Sensation: Sensation is intact to light touch and pinprick throughout. Vibration is decreased distally. There is no extinction with double simultaneous stimulation. There is no sensory dermatomal level identified. Coordination:  The patient has no difficulty with RAM's or FNF bilaterally. Motor: Strength is 5/5 in the bilateral upper and lower extremities. There is no pronator drift.  There are no fasciculations noted. DTR's: Deep tendon  reflexes are 1/4 at the bilateral biceps, triceps, brachioradialis, patella and achilles.  Plantar responses are downgoing bilaterally. Gait and Station: The patient ambulates with a walker and the foot is plantar flexed from foot drop.    LABS  Lab Results  Component Value Date   VITAMINB12 3517* 03/25/2015   Impression/Plan:  1.  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 We will do neuropsych testing.   We discussed community resources for patient and caregiver.  We discussed medications and the fact that they do not prevent memory loss nor do they halt it.  We will do testing first and go from there.  -We discussed the importance of physical and mental exercises and I explained to the patient and family what this means.    -Counseling was offered to the patient and family and they ultimately decided to hold on that.  Pt doesn't recognize the issue  -may benefit from neudexta if PBA continues to be an issue.  Not bothering patient, however.  Discussed this with patient and wife.   2.  R foot drop due to history of sacral mass   -talked about AFO and they are going for that today. 3.  Much greater than 50% of this visit was spent in counseling and coordinating care.  Total face to face time:  65 min

## 2015-10-16 ENCOUNTER — Ambulatory Visit: Payer: Medicare Other | Admitting: Physical Therapy

## 2015-10-16 DIAGNOSIS — R600 Localized edema: Secondary | ICD-10-CM | POA: Diagnosis not present

## 2015-10-16 DIAGNOSIS — M6281 Muscle weakness (generalized): Secondary | ICD-10-CM | POA: Diagnosis not present

## 2015-10-16 DIAGNOSIS — R609 Edema, unspecified: Secondary | ICD-10-CM

## 2015-10-16 DIAGNOSIS — M25571 Pain in right ankle and joints of right foot: Secondary | ICD-10-CM | POA: Diagnosis not present

## 2015-10-16 DIAGNOSIS — M79671 Pain in right foot: Secondary | ICD-10-CM | POA: Diagnosis not present

## 2015-10-16 DIAGNOSIS — M2141 Flat foot [pes planus] (acquired), right foot: Secondary | ICD-10-CM | POA: Diagnosis not present

## 2015-10-16 NOTE — Therapy (Signed)
[place], [place], [address] Phone: [phone]   Fax:  [phone]  Physical Therapy Treatment  Patient Details  Name: Perry Nielsen. MRN: [id number] Date of Birth: 20-Jul-1940 Referring Provider: Dr. Julieanne Manson  Encounter Date: 10/16/2015      PT End of Session - 10/16/15 1727    Visit Number 8   Number of Visits 18   Date for PT Re-Evaluation 11/08/15   Authorization Type April cert done 0/37   PT Start Time 1516   PT Stop Time 1606   PT Time Calculation (min) 50 min   Activity Tolerance Patient tolerated treatment well   Behavior During Therapy [facility] for tasks assessed/performed      Past Medical History  Diagnosis Date  . 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 ALLERGIC RHINITIS 08/03/2007  . GERD 02/18/2007  . KNEE PAIN, LEFT, CHRONIC 08/07/2008  . BACK PAIN 09/18/2009  . HAMMER TOE 08/03/2007  . CAROTID BRUIT, LEFT 08/07/2008  . 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 Foot drop     rt foot  . Rash, skin     torso  . Hepatitis 1977    no residual problems, type A  . Sleeping difficulty   . Skin cancer   . Lymphoma [facility])     Past Surgical History  Procedure Laterality Date  . Shoulder open rotator cuff repair  07/06/05    right  . Knee arthroscopy      left  . Tonsillectomy and adenoidectomy    . Transurethral resection of prostate    . Cataract extraction w/ intraocular lens  implant, bilateral Bilateral   . 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      Subjective Assessment - 10/16/15 1521    Subjective Did well getting the compression stocking on with the donning butler, but still had some trouble getting it off.  Went fishing yesterday and  could fish from the vehicle.   Currently in Pain? No/denies               LYMPHEDEMA/ONCOLOGY QUESTIONNAIRE - 10/16/15 1525    Right Lower Extremity Lymphedema   30 cm Proximal to Floor at Lateral Plantar Foot 31 cm   20 cm Proximal to Floor at Lateral Plantar Foot 26.'1 1   10 ' cm Proximal to Floor at Lateral Malleoli 25.5 cm   5 cm Proximal to 1st MTP Joint 24.7 cm   Around Proximal Great Toe 8.6 cm   Other 40 cm. proximal to plantar foot 31.5                  [facility] Adult PT Treatment/Exercise - 10/16/15 0001    Manual Therapy   Edema Management circumference measurements taken   Manual Lymphatic Drainage (MLD) short neck, superficial and deep abdominals, stimulation of right axillary nodes, established axillo-inguinal anastomosis, RLE working distally to R foot moving fluid laterally and towards pathway, revisiting axillo inguinal anastomosis and finished with axillay lymph nodes   Compression Bandaging lotion applied; right leg bandaged with TG soft from foot to knee, Elastomull on all five toes, Artiflex x 2, 1-8 cm. and 2-12 cm. short stretch bandages from foot to knee   Ankle Exercises: Seated  Other Seated Ankle Exercises active-assistive dorsiflexion/plantarflexion, active-assistive circles with right ankle after leg was bandaged                   Short Term Clinic Goals - 10/14/15 2309    CC Short Term Goal  #1   Title circumference at base of right great toe will be reduced to 9 cm.    Status Achieved   CC Short Term Goal  #2   Title circumference of right foot at 5 cm. proximal to metatarsal head will be reduced to 25. cm.   Status Achieved   CC Short Term Goal  #3   Title independent with home exercise program for right ankle strengthening             Long Term Clinic Goals - 10/16/15 1730    CC Long Term Goal  #2   Title pain in right foot improved at least 50% in frequency and/or intensitiy   Status On-going   CC Long Term Goal   #3   Title lymphedema life impact scale reduced to 30% or less impairment   Status On-going   CC Long Term Goal  #4   Title Pt. will be able to use an AFO to prevent foot drop and enable safe gait (or no AFO if not needed)   Status Partially Met            Plan - 10/16/15 1727    Clinical Impression Statement Right foot was more swollen today after patient had compression sock on part time and regular socks on part time, no bandaging since Monday.  Foot and lower leg were bandaged again today to reduce again.   Rehab Potential Good   PT Frequency 3x / week   PT Duration 6 weeks   PT Next Visit Plan Show patient and his wife velcro garment options like CircAid and Reidsleeve.Complete decongestive therapy as needed; continue to work with patient on transition to self-care (and care of his wife) with appropriate equipment.   PT Home Exercise Plan writing alphabet with his right foot 3x/day   Consulted and Agree with Plan of Care Patient      Patient will benefit from skilled therapeutic intervention in order to improve the following deficits and impairments:  Increased edema, Pain, Decreased strength, Abnormal gait, Decreased activity tolerance, Decreased knowledge of use of DME, Decreased knowledge of precautions  Visit Diagnosis: Edema, unspecified     Problem List Patient Active Problem List   Diagnosis Date Noted  . Leukocytosis   . Pancreatic mass   . Abdominal pain 07/07/2015  . Pancreatitis 07/07/2015  . DVT (deep venous thrombosis) ([place]) 04/19/2015  . Abdominal fluid collection   . Sepsis ([place]) 04/18/2015  . Intra-abdominal abscess ([place]) 04/18/2015  . Cognitive deficits 03/26/2015  . Thrombocytopenia ([place]) 03/25/2015  . Decubitus ulcer of buttock, stage 2 03/25/2015  . Acute encephalopathy 03/24/2015  . Lymphoma, large cell ([place]) 03/07/2015  . Rash 03/05/2015  . Malnutrition of moderate degree ([place]) 02/05/2015  . Foot drop 02/03/2015  . 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 Backache 09/18/2009  . 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 KNEE PAIN, LEFT, CHRONIC 08/07/2008  . CAROTID BRUIT, LEFT 08/07/2008  . Allergic rhinitis 08/03/2007  . 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 GERD 02/18/2007  . BPH associated with nocturia 02/18/2007    Nielsen,Perry 10/16/2015, 5:32 PM  [place], [place], [address] Phone:  [phone]   Fax:  [phone]  Name: Perry Nielsen. MRN: [id number] Date of Birth: 08-13-1940    Serafina Royals, PT 10/16/2015 5:32 PM

## 2015-10-21 ENCOUNTER — Ambulatory Visit: Payer: Medicare Other | Admitting: Physical Therapy

## 2015-10-21 DIAGNOSIS — M25571 Pain in right ankle and joints of right foot: Secondary | ICD-10-CM | POA: Diagnosis not present

## 2015-10-21 DIAGNOSIS — R609 Edema, unspecified: Secondary | ICD-10-CM | POA: Diagnosis not present

## 2015-10-21 DIAGNOSIS — M6281 Muscle weakness (generalized): Secondary | ICD-10-CM | POA: Diagnosis not present

## 2015-10-21 DIAGNOSIS — R600 Localized edema: Secondary | ICD-10-CM | POA: Diagnosis not present

## 2015-10-21 DIAGNOSIS — M2141 Flat foot [pes planus] (acquired), right foot: Secondary | ICD-10-CM | POA: Diagnosis not present

## 2015-10-21 DIAGNOSIS — M79671 Pain in right foot: Secondary | ICD-10-CM | POA: Diagnosis not present

## 2015-10-21 NOTE — Therapy (Signed)
[place], [place], [address] Phone: [phone]   Fax:  [phone]  Physical Therapy Treatment  Patient Details  Name: Perry Nielsen. MRN: [id number] Date of Birth: 22-May-1941 Referring Provider: Dr. Julieanne Manson  Encounter Date: 10/21/2015      PT End of Session - 10/21/15 2106    Visit Number 9   Number of Visits 18   Date for PT Re-Evaluation 11/08/15   Authorization Type April cert done 7/20   PT Start Time 1521   PT Stop Time 1610   PT Time Calculation (min) 49 min   Activity Tolerance Patient tolerated treatment well   Behavior During Therapy [facility] for tasks assessed/performed      Past Medical History  Diagnosis Date  . 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 ALLERGIC RHINITIS 08/03/2007  . GERD 02/18/2007  . KNEE PAIN, LEFT, CHRONIC 08/07/2008  . BACK PAIN 09/18/2009  . HAMMER TOE 08/03/2007  . CAROTID BRUIT, LEFT 08/07/2008  . 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 Foot drop     rt foot  . Rash, skin     torso  . Hepatitis 1977    no residual problems, type A  . Sleeping difficulty   . Skin cancer   . Lymphoma [facility])     Past Surgical History  Procedure Laterality Date  . Shoulder open rotator cuff repair  07/06/05    right  . Knee arthroscopy      left  . Tonsillectomy and adenoidectomy    . Transurethral resection of prostate    . Cataract extraction w/ intraocular lens  implant, bilateral Bilateral   . 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                       [facility] Adult PT Treatment/Exercise - 10/21/15 0001    Self-Care   Other Self-Care Comments  Showed patient and his wife samples of some of the types of nighttime  compression garments available to him, such as Reidsleeves, JoviPak, CircAid, Juzo's newer velcro compression wrap, Solaris, describing theses.  Patient repeated questions about when this garment would be worn.  Checked patient donning his compression stocking, which he did with little (although some) help today.   Manual Therapy   Compression Bandaging lotion applied; right leg bandaged with TG soft from foot to knee, Elastomull on all five toes, Artiflex x 2, 1-8 cm. and 2-12 cm. short stretch bandages from foot to knee                   Short Term Clinic Goals - 10/14/15 2309    CC Short Term Goal  #1   Title circumference at base of right great toe will be reduced to 9 cm.    Status Achieved   CC Short Term Goal  #2   Title circumference of right foot at 5 cm. proximal to metatarsal head will be reduced to 25. cm.   Status Achieved   CC Short Term Goal  #3   Title independent with home exercise program for right ankle strengthening             Long Term Clinic Goals - 10/16/15  [place] Term Goal  #2   Title pain in right foot improved at least 50% in frequency and/or intensitiy   Status On-going   CC Long Term Goal  #3   Title lymphedema life impact scale reduced to 30% or less impairment   Status On-going   CC Long Term Goal  #4   Title Pt. will be able to use an AFO to prevent foot drop and enable safe gait (or no AFO if not needed)   Status Partially Met            Plan - 10/21/15 2106    Clinical Impression Statement Right foot was again swollen, as patient hadn't had compression bandages on the last couple of days.  He had managed to get his cut/makeshift shoe over two stockings on that right foot.  He did don the compression stocking using the donning butler aid with minimal help, but not completely by himself.  Patient asked more than once if getting the correct shoe whould help walk without needing the walker, and this was said not to be the  case.    Rehab Potential Good   PT Frequency 3x / week   PT Duration 6 weeks   PT Treatment/Interventions ADLs/Self Care Home Management;Compression bandaging   PT Next Visit Plan Pt. is to leave bandages on if possible until the morning of his next (afternoon) visit, then to remove them, get his compression stocking on, and get his foot into the shoe and brace.  This should give him several hours to to test his tolerance for compression stocking, shoe, and brace.     PT Home Exercise Plan writing alphabet with his right foot 3x/day   Consulted and Agree with Plan of Care Patient      Patient will benefit from skilled therapeutic intervention in order to improve the following deficits and impairments:  Increased edema, Pain, Decreased strength, Abnormal gait, Decreased activity tolerance, Decreased knowledge of use of DME, Decreased knowledge of precautions  Visit Diagnosis: Edema, unspecified  Pain in right ankle and joints of right foot  Muscle weakness (generalized)     Problem List Patient Active Problem List   Diagnosis Date Noted  . Leukocytosis   . Pancreatic mass   . Abdominal pain 07/07/2015  . Pancreatitis 07/07/2015  . DVT (deep venous thrombosis) ([place]) 04/19/2015  . Abdominal fluid collection   . Sepsis ([place]) 04/18/2015  . Intra-abdominal abscess ([place]) 04/18/2015  . Cognitive deficits 03/26/2015  . Thrombocytopenia ([place]) 03/25/2015  . Decubitus ulcer of buttock, stage 2 03/25/2015  . Acute encephalopathy 03/24/2015  . Lymphoma, large cell ([place]) 03/07/2015  . Rash 03/05/2015  . Malnutrition of moderate degree ([place]) 02/05/2015  . Foot drop 02/03/2015  . 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 Backache 09/18/2009  . 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 KNEE PAIN, LEFT, CHRONIC 08/07/2008  . CAROTID BRUIT, LEFT 08/07/2008  . Allergic rhinitis 08/03/2007  . 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 GERD 02/18/2007  . BPH associated with  nocturia 02/18/2007    Ferrel Simington 10/21/2015, 9:15 PM  [place], [place], [address] Phone: [phone]   Fax:  [phone]  Name: Demeco Ducksworth. MRN: [id number] Date of Birth: 04/08/1941    Serafina Royals, PT 10/21/2015 9:15 PM

## 2015-10-22 ENCOUNTER — Ambulatory Visit (HOSPITAL_COMMUNITY)
Admission: RE | Admit: 2015-10-22 | Discharge: 2015-10-22 | Disposition: A | Discharge status: Home / Self Care | Payer: Medicare Other | Source: Ambulatory Visit | Attending: Neurology | Admitting: Neurology

## 2015-10-22 DIAGNOSIS — G9340 Encephalopathy, unspecified: Secondary | ICD-10-CM | POA: Diagnosis not present

## 2015-10-22 DIAGNOSIS — R410 Disorientation, unspecified: Secondary | ICD-10-CM | POA: Diagnosis not present

## 2015-10-22 DIAGNOSIS — Z8579 Personal history of other malignant neoplasms of lymphoid, hematopoietic and related tissues: Secondary | ICD-10-CM | POA: Diagnosis not present

## 2015-10-22 DIAGNOSIS — C8590 Non-Hodgkin lymphoma, unspecified, unspecified site: Secondary | ICD-10-CM | POA: Diagnosis not present

## 2015-10-22 MED ORDER — GADOBENATE DIMEGLUMINE 529 MG/ML IV SOLN
15.0000 mL | Freq: Once | INTRAVENOUS | Status: AC | PRN
  Administered 2015-10-22: 14 mL via INTRAVENOUS

## 2015-10-23 ENCOUNTER — Ambulatory Visit: Payer: Medicare Other | Admitting: Physical Therapy

## 2015-10-23 ENCOUNTER — Telehealth: Payer: Self-pay | Admitting: Neurology

## 2015-10-23 DIAGNOSIS — R609 Edema, unspecified: Secondary | ICD-10-CM | POA: Diagnosis not present

## 2015-10-23 DIAGNOSIS — M79671 Pain in right foot: Secondary | ICD-10-CM | POA: Diagnosis not present

## 2015-10-23 DIAGNOSIS — M25571 Pain in right ankle and joints of right foot: Secondary | ICD-10-CM | POA: Diagnosis not present

## 2015-10-23 DIAGNOSIS — M2141 Flat foot [pes planus] (acquired), right foot: Secondary | ICD-10-CM | POA: Diagnosis not present

## 2015-10-23 DIAGNOSIS — M6281 Muscle weakness (generalized): Secondary | ICD-10-CM | POA: Diagnosis not present

## 2015-10-23 DIAGNOSIS — R600 Localized edema: Secondary | ICD-10-CM | POA: Diagnosis not present

## 2015-10-23 NOTE — Patient Instructions (Signed)
Patient to try to wear his compression stocking as much as possible until next visit here in two days.  Also to try to wear shoe and AFO during the day as much as possible.  All garments to be removed if he has pain from them.

## 2015-10-23 NOTE — Telephone Encounter (Signed)
Patient made aware.

## 2015-10-23 NOTE — Therapy (Signed)
[place], [place], [address] Phone: [phone]   Fax:  [phone]  Physical Therapy Treatment  Patient Details  Name: Perry Nielsen. MRN: [id number] Date of Birth: 02/25/1941 Referring Provider: Dr. Julieanne Manson  Encounter Date: 10/23/2015      PT End of Session - 10/23/15 1727    Visit Number 10   Number of Visits 18   Date for PT Re-Evaluation 11/08/15   Authorization Type April cert done 1/96   PT Start Time 1523   PT Stop Time 1608   PT Time Calculation (min) 45 min   Activity Tolerance Patient tolerated treatment well   Behavior During Therapy [facility] for tasks assessed/performed      Past Medical History  Diagnosis Date  . 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 ALLERGIC RHINITIS 08/03/2007  . GERD 02/18/2007  . KNEE PAIN, LEFT, CHRONIC 08/07/2008  . BACK PAIN 09/18/2009  . HAMMER TOE 08/03/2007  . CAROTID BRUIT, LEFT 08/07/2008  . 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 Foot drop     rt foot  . Rash, skin     torso  . Hepatitis 1977    no residual problems, type A  . Sleeping difficulty   . Skin cancer   . Lymphoma [facility])     Past Surgical History  Procedure Laterality Date  . Shoulder open rotator cuff repair  07/06/05    right  . Knee arthroscopy      left  . Tonsillectomy and adenoidectomy    . Transurethral resection of prostate    . Cataract extraction w/ intraocular lens  implant, bilateral Bilateral   . 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      Subjective Assessment - 10/23/15 1526    Subjective Kept part of the bandages on until this morning, and then was able to get the sock on easily with the donning butler, and the shoe slid on  easily too.  Now the right great toe hurts a bit-feels like it's rubbing.  Has had the compression stocking, shoe, and brace on since 10:30 am(now 3:30 pm).   Currently in Pain? Yes   Pain Score 2    Pain Location Toe (Comment which one)  great   Pain Orientation Right   Aggravating Factors  foot being in shoe all day            [facility] PT Assessment - 10/23/15 0001    Observation/Other Assessments   Observations removed shoe and brace, then compression stocking, which have all been on for five hours.  Foot swelling seems to have been fairly well controlled; toes are a bit puffy.  There is a pocket of swelling at right lateral lower leg, seemingly from the AFO pressing up against him there.           LYMPHEDEMA/ONCOLOGY QUESTIONNAIRE - 10/23/15 1534    Right Lower Extremity Lymphedema   30 cm Proximal to Floor at Lateral Plantar Foot 27 cm   20 cm Proximal to Floor at Lateral Plantar Foot _0 cm Proximal to Floor at Lateral Malleoli 24.2 cm   5 cm Proximal to 1st MTP Joint 23.9 cm   Around Proximal Great Toe  8.5 cm   Other 40 cm. proximal to plantar foot 29.8                  [facility] Adult PT Treatment/Exercise - 10/24/15 0001    Self-Care   Other Self-Care Comments  discussed equipmenment further, including about obtaining a donning butler, obtaining another pair of the compression socks like the ones he has, getting a velcro garment for nighttime wear, and about insurance information that was found out about this   Manual Therapy   Edema Management circumference measurements taken   Manual Lymphatic Drainage (MLD) short neck, diaphragmatic breathing, superficial abdomen, right axilla and inguino-axillary pathway, and right leg from dorsal foot to lateral hip   Compression Bandaging applied patient's compression stocking, brace and shoe                   Short Term Clinic Goals - October 24, 2015 1730    CC Short Term Goal  #3   Title independent with home  exercise program for right ankle strengthening   Status Achieved             Long Term Clinic Goals - 10-24-2015 1731    CC Long Term Goal  #4   Title Pt. will be able to use an AFO to prevent foot drop and enable safe gait (or no AFO if not needed)   Status Partially Met            Plan - October 24, 2015 1727    Clinical Impression Statement Right foot edema was fairly well controlled with bandages having stayed on, at least some of them, until this morning and then patient having his drugstore compression stocking, AFO, and shoe on since 1030.  We are working toward finding the optimal equipment for him that will keep his edema in check and provide him mobility, all of it needing to something that he and his wife can handle.   Rehab Potential Good   PT Frequency 3x / week   PT Duration 6 weeks   PT Treatment/Interventions ADLs/Self Care Home Management;Patient/family education;Manual lymph drainage   PT Next Visit Plan Check goals.  Remeasure circumferences to see how the leg did with mostly using compression stocking; provide more information about obtaining donning butler, velcro nighttime garment.   Consulted and Agree with Plan of Care Patient      Patient will benefit from skilled therapeutic intervention in order to improve the following deficits and impairments:  Increased edema, Pain, Decreased strength, Abnormal gait, Decreased activity tolerance, Decreased knowledge of use of DME, Decreased knowledge of precautions  Visit Diagnosis: Edema, unspecified       G-Codes - 24-Oct-2015 1731    Functional Assessment Tool Used clinical judgement   Functional Limitation Self care   Self Care Current Status (R4854) At least 20 percent but less than 40 percent impaired, limited or restricted   Self Care Goal Status (O2703) At least 20 percent but less than 40 percent impaired, limited or restricted      Problem List Patient Active Problem List   Diagnosis Date Noted  .  Leukocytosis   . Pancreatic mass   . Abdominal pain 07/07/2015  . Pancreatitis 07/07/2015  . DVT (deep venous thrombosis) ([place]) 04/19/2015  . Abdominal fluid collection   . Sepsis ([place]) 04/18/2015  . Intra-abdominal abscess ([place]) 04/18/2015  . Cognitive deficits 03/26/2015  . Thrombocytopenia ([place]) 03/25/2015  . Decubitus ulcer of buttock, stage 2 03/25/2015  . Acute encephalopathy 03/24/2015  .  Lymphoma, large cell ([place]) 03/07/2015  . Rash 03/05/2015  . Malnutrition of moderate degree ([place]) 02/05/2015  . Foot drop 02/03/2015  . 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 Backache 09/18/2009  . 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 KNEE PAIN, LEFT, CHRONIC 08/07/2008  . CAROTID BRUIT, LEFT 08/07/2008  . Allergic rhinitis 08/03/2007  . 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 GERD 02/18/2007  . BPH associated with nocturia 02/18/2007    SALISBURY,DONNA 10/23/2015, 5:33 PM  [place], [place], [address] Phone: [phone]   Fax:  [phone]  Name: Perry Nielsen. MRN: [id number] Date of Birth: 05/19/1941    Serafina Royals, PT 10/23/2015 5:33 PM

## 2015-10-23 NOTE — Telephone Encounter (Signed)
-----   Message from [place], DO sent at 10/23/2015  8:01 AM EDT ----- Very mild small vessel disease and mild atrophy.  Perry Nielsen, let pt/fam know that MRI looks good and no changes since prior MRI brain

## 2015-10-24 ENCOUNTER — Telehealth: Payer: Self-pay | Admitting: Oncology

## 2015-10-24 ENCOUNTER — Ambulatory Visit (HOSPITAL_BASED_OUTPATIENT_CLINIC_OR_DEPARTMENT_OTHER): Payer: Medicare Other | Admitting: Nurse Practitioner

## 2015-10-24 VITALS — BP 108/61 | HR 90 | Temp 97.0°F | Ht 72.0 in | Wt 157.3 lb

## 2015-10-24 DIAGNOSIS — C8580 Other specified types of non-Hodgkin lymphoma, unspecified site: Secondary | ICD-10-CM

## 2015-10-24 DIAGNOSIS — I4891 Unspecified atrial fibrillation: Secondary | ICD-10-CM

## 2015-10-24 DIAGNOSIS — R4182 Altered mental status, unspecified: Secondary | ICD-10-CM

## 2015-10-24 NOTE — Telephone Encounter (Signed)
Gave and printed appt sched and avs for pt for July  °

## 2015-10-24 NOTE — Progress Notes (Addendum)
[place] OFFICE PROGRESS NOTE   Diagnosis:  Non-Hodgkin's lymphoma  INTERVAL HISTORY:   Mr. Putman returns as scheduled. He continues to have a memory deficit. His wife reports this mainly revolves around family and home situations. He is intermittently confused. Overall his wife feels his mental status is better. He has a good appetite. No fevers or sweats.  Objective:  Vital signs in last 24 hours:  Blood pressure 108/61, pulse 90, temperature 97 F (36.1 C), temperature source Oral, height 6' (1.829 m), weight 157 lb 4.8 oz (71.351 kg), SpO2 97 %.    HEENT: No thrush or ulcers. Lymphatics: No palpable cervical, supra clavicular or axillary lymph nodes. Resp: Lungs clear bilaterally. Cardio: Regular rate and rhythm. GI: Abdomen soft and nontender. No hepatomegaly. Vascular: Trace lower leg edema bilaterally. Neuro: Alert and oriented. Weakness with dorsiflexion at the right foot. He is a right foot brace.   Lab Results:  Lab Results  Component Value Date   WBC 8.1 09/10/2015   HGB 12.6* 09/10/2015   HCT 37.7* 09/10/2015   MCV 93.5 09/10/2015   PLT 280 09/10/2015   NEUTROABS 5.4 09/10/2015    Imaging:  Mr Jeri Cos GE Contrast  10/22/2015  CLINICAL DATA:  75 year old male with acute confusion, encephalopathy. Initial encounter. Non-Hodgkin's lymphoma. EXAM: MRI HEAD WITHOUT AND WITH CONTRAST TECHNIQUE: Multiplanar, multiecho pulse sequences of the brain and surrounding structures were obtained without and with intravenous contrast. CONTRAST:  14 mL MultiHance COMPARISON:  Brain MRI 03/24/2015. FINDINGS: Major intracranial vascular flow voids are stable and within normal limits. Stable cerebral volume. No restricted diffusion to suggest acute infarction. No midline shift, mass effect, evidence of mass lesion, ventriculomegaly, extra-axial collection or acute intracranial hemorrhage. Cervicomedullary junction and pituitary are within normal limits. Stable  gray and white matter signal throughout the brain; scattered small nonspecific cerebral white matter T2 and FLAIR hyperintense foci. No cortical encephalomalacia or chronic cerebral blood products. Otherwise normal gray and white matter signal. No abnormal enhancement identified; incidental anterior right frontal operculum developmental venous anomaly (normal variant). No dural thickening. Visible internal auditory structures appear normal. Mastoids are clear. Paranasal sinuses are stable in clear. Negative orbit and scalp soft tissues. Normal bone marrow signal. Negative visualized cervical spine. IMPRESSION: No acute or metastatic intracranial abnormality. Stable MRI appearance of the brain since 2016. Electronically Signed   By: Genevie Ann M.D.   On: 10/22/2015 18:18    Medications: I have reviewed the patient's current medications.  Assessment/Plan: 1. 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 History of thrombocytopenia predating chemotherapy 3. T7 compression fracture-likely pathologic 4. 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 Right foot pain-likely secondary to nerve compression from the sacral mass, improved 6. Diffuse  erythematous rash, appearance consistent with a "drug" rash-resolved 7. History of low-grade fever likely secondary to atelectasis/postop fever versus tumor fever 8. Hepatitis B core antibody positive  10. Admission 04/18/2015 through 04/23/2015 presenting with fever; amylase and lipase elevated.   Abdominal CT showed inflammation in the left upper quadrant and associated loculated fluid collections status post drainage procedure, fluid with high levels of amylase and lipase; cultures negative; question pancreatitis.   04/26/2015 lipase mildly elevated, amylase normal.   CT abdomen/pelvis 05/14/2015 with a persistent 6.5 x 3.8 cm posterior pancreatic tail fluid collection similar in size to the 04/18/2015 CT study. Additional smaller fluid collections in the far lateral left upper quadrant; extensive patchy groundglass opacity at the lung bases, significantly increased; stable expansile lytic lesion in the upper right sacrum. 11. Bilateral lower extremity DVTs on a Doppler 04/18/2015, now on Lovenox. 12. Severe neutropenia 05/13/2015. Likely delayed effect of Rituxan. Resolved 13. Chest x-ray 05/15/2015 with bilateral mid to lower lung interstitial thickening with some hazy lower lung zone groundglass opacity. Partially improved on the chest x-ray 05/24/2015 14. New-onset rapid atrial fibrillation 05/29/2015 15. Admission with acute onset upper abdomen/low anterior chest pain 07/07/2015-most likely related to Neulasta, spontaneously resolved 16. Altered mental status- Potentially related to persistent delirium following a prolonged critical illness , depression, dementia, or psychosis. Brain MRI 10/22/2015 with no acute or metastatic intracranial abnormality. Stable appearance of the brain since 2016. Mental status improved 10/24/2015. He is followed by neurology.   Disposition: Mr. Daughety appears stable. He remains in clinical remission from non-Hodgkin's lymphoma. His mental status is  better. He will continue follow-up with neurology. We scheduled a return visit here with labs in 3 months. He will contact the office in the interim with any problems.  Patient seen with Dr. Benay Spice.    Ned Card ANP/GNP-BC   10/24/2015  12:51 PM  This was a shared visit with Ned Card. Mr. Vidales is in remission from non-Hodgkin's lymphoma. His performance status and mental status appears improved today. He will follow-up with Dr. Carles Collet for neuropsychiatric testing. He will return for an office visit in 3 months.  Julieanne Manson, M.D.

## 2015-10-25 ENCOUNTER — Ambulatory Visit: Payer: Self-pay | Admitting: Nurse Practitioner

## 2015-10-25 ENCOUNTER — Ambulatory Visit: Payer: Medicare Other | Admitting: Physical Therapy

## 2015-10-25 ENCOUNTER — Encounter: Payer: Self-pay | Admitting: Physical Therapy

## 2015-10-25 DIAGNOSIS — M25571 Pain in right ankle and joints of right foot: Secondary | ICD-10-CM

## 2015-10-25 DIAGNOSIS — M6281 Muscle weakness (generalized): Secondary | ICD-10-CM | POA: Diagnosis not present

## 2015-10-25 DIAGNOSIS — M79671 Pain in right foot: Secondary | ICD-10-CM | POA: Diagnosis not present

## 2015-10-25 DIAGNOSIS — M2141 Flat foot [pes planus] (acquired), right foot: Secondary | ICD-10-CM | POA: Diagnosis not present

## 2015-10-25 DIAGNOSIS — R609 Edema, unspecified: Secondary | ICD-10-CM

## 2015-10-25 DIAGNOSIS — R600 Localized edema: Secondary | ICD-10-CM | POA: Diagnosis not present

## 2015-10-25 NOTE — Therapy (Signed)
[place], [place], [address] Phone: [phone]   Fax:  [phone]  Physical Therapy Treatment  Patient Details  Name: Perry Nielsen. MRN: [id number] Date of Birth: [birth year] Referring Provider: Dr. Julieanne Manson  Encounter Date: 10/25/2015      PT End of Session - 10/25/15 1152    Visit Number 11   Number of Visits 18   Date for PT Re-Evaluation 11/08/15   Authorization Type April cert done 6/20   PT Start Time 1105   PT Stop Time 1145   PT Time Calculation (min) 40 min   Activity Tolerance Patient tolerated treatment well   Behavior During Therapy [facility] for tasks assessed/performed      Past Medical History  Diagnosis Date  . 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 ALLERGIC RHINITIS 08/03/2007  . GERD 02/18/2007  . KNEE PAIN, LEFT, CHRONIC 08/07/2008  . BACK PAIN 09/18/2009  . HAMMER TOE 08/03/2007  . CAROTID BRUIT, LEFT 08/07/2008  . 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 Foot drop     rt foot  . Rash, skin     torso  . Hepatitis 1977    no residual problems, type A  . Sleeping difficulty   . Skin cancer   . Lymphoma [facility])     Past Surgical History  Procedure Laterality Date  . Shoulder open rotator cuff repair  07/06/05    right  . Knee arthroscopy      left  . Tonsillectomy and adenoidectomy    . Transurethral resection of prostate    . Cataract extraction w/ intraocular lens  implant, bilateral Bilateral   . 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      Subjective Assessment - 10/25/15 1106    Subjective He has been using the compression stocking since Wednesday and has removed and re donned it twice since then. Pt and spouse have been using  donning butler to get compression garments on.     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  Was diagnosed in early August.  Treatment has included radiation for 10 days and had 2 chemos; didn't tolerate the 6 that were planned.    Memory loss started about four weeks; trying to taper meds to ese if that helps.  Was healthy prior to this diagnosis.  Had blood clots in legs in October so is on Xarelto for six months.   Patient Stated Goals get help with drop foot   Currently in Pain? No/denies   Pain Score 0-No pain               LYMPHEDEMA/ONCOLOGY QUESTIONNAIRE - 10/25/15 1108    Right Lower Extremity Lymphedema   30 cm Proximal to Floor at Lateral Plantar Foot 30.6 cm   20 cm Proximal to Floor at Lateral Plantar Foot 27.'5 1   10 ' cm Proximal to Floor at Lateral Malleoli 25.3 cm   5 cm Proximal to 1st MTP Joint 22.9 cm   Around Proximal Great Toe 8.5 cm                  [facility] Adult  PT Treatment/Exercise - 10/25/15 0001    Manual Therapy   Edema Management circumference measurements taken   Manual Lymphatic Drainage (MLD) short neck, diaphragmatic breathing, superficial abdomen, right axilla and inguino-axillary pathway, and right leg from dorsal foot to lateral hip                PT Education - 10/25/15 1201    Education provided Yes   Education Details nighttime compression garments, toe caps, donning aid and how to order   Person(s) Educated Patient;Spouse   Methods Explanation;Handout   Comprehension Verbalized understanding           Short Term Clinic Goals - 10/23/15 1730    CC Short Term Goal  #3   Title independent with home exercise program for right ankle strengthening   Status Achieved             Long Term Clinic Goals - 10/23/15 1731    CC Long Term Goal  #4   Title Pt. will be able to use an AFO to prevent foot drop  and enable safe gait (or no AFO if not needed)   Status Partially Met            Plan - 10/25/15 1152    Clinical Impression Statement Right foot edema was well controlled with use of compression stocking but his calf edema increased signficantly. After reviewing the size chart on the box for his compression garments they are one size too large. Pt and spouse were educated to purchase a size medium compression stocking. Pt and spouse were educated at length and pt was measured for the following: velcro night time garment and foot wrap, toe cap and donning butler. Pt and spouse plan to purchase these.    Rehab Potential Good   PT Frequency 3x / week   PT Duration 6 weeks   PT Treatment/Interventions ADLs/Self Care Home Management;Patient/family education;Manual lymph drainage   PT Next Visit Plan assess circumferential measurements with use of size medium stocking, if measurements not controlled pt may require a prescription garment, continue MLD to RLE   Consulted and Agree with Plan of Care Patient      Patient will benefit from skilled therapeutic intervention in order to improve the following deficits and impairments:  Increased edema, Pain, Decreased strength, Abnormal gait, Decreased activity tolerance, Decreased knowledge of use of DME, Decreased knowledge of precautions  Visit Diagnosis: Edema, unspecified  Pain in right ankle and joints of right foot  Muscle weakness (generalized)     Problem List Patient Active Problem List   Diagnosis Date Noted  . Leukocytosis   . Pancreatic mass   . Abdominal pain 07/07/2015  . Pancreatitis 07/07/2015  . DVT (deep venous thrombosis) ([place]) 04/19/2015  . Abdominal fluid collection   . Sepsis ([place]) 04/18/2015  . Intra-abdominal abscess ([place]) 04/18/2015  . Cognitive deficits 03/26/2015  . Thrombocytopenia ([place]) 03/25/2015  . Decubitus ulcer of buttock, stage 2 03/25/2015  . Acute encephalopathy 03/24/2015  . Lymphoma, large cell  ([place]) 03/07/2015  . Rash 03/05/2015  . Malnutrition of moderate degree ([place]) 02/05/2015  . Foot drop 02/03/2015  . 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 Backache 09/18/2009  . 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 KNEE PAIN, LEFT, CHRONIC 08/07/2008  . CAROTID BRUIT, LEFT 08/07/2008  . Allergic rhinitis 08/03/2007  . 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 GERD 02/18/2007  . BPH associated with nocturia 02/18/2007    Ryelynn Guedea L Breedlove 10/25/2015, 12:02 PM  [place], [place], [address] Phone: [phone]   Fax:  [phone]  Name: Perry Nielsen. MRN: [id number] Date of Birth: 1941-06-17    Allyson Sabal, PT 10/25/2015 12:02 PM

## 2015-10-29 ENCOUNTER — Ambulatory Visit: Payer: Medicare Other | Admitting: Physical Therapy

## 2015-10-29 DIAGNOSIS — L570 Actinic keratosis: Secondary | ICD-10-CM | POA: Diagnosis not present

## 2015-10-29 DIAGNOSIS — M6281 Muscle weakness (generalized): Secondary | ICD-10-CM

## 2015-10-29 DIAGNOSIS — R609 Edema, unspecified: Secondary | ICD-10-CM | POA: Diagnosis not present

## 2015-10-29 DIAGNOSIS — R600 Localized edema: Secondary | ICD-10-CM | POA: Diagnosis not present

## 2015-10-29 DIAGNOSIS — M79671 Pain in right foot: Secondary | ICD-10-CM | POA: Diagnosis not present

## 2015-10-29 DIAGNOSIS — M2141 Flat foot [pes planus] (acquired), right foot: Secondary | ICD-10-CM | POA: Diagnosis not present

## 2015-10-29 DIAGNOSIS — M25571 Pain in right ankle and joints of right foot: Secondary | ICD-10-CM | POA: Diagnosis not present

## 2015-10-29 NOTE — Therapy (Signed)
[place], [place], [address] Phone: [phone]   Fax:  [phone]  Physical Therapy Treatment  Patient Details  Name: Perry Nielsen. MRN: [id number] Date of Birth: 1940-08-06 Referring Provider: Dr. Julieanne Manson  Encounter Date: 10/29/2015      PT End of Session - 10/29/15 1710    Visit Number 12   Number of Visits 18   Date for PT Re-Evaluation 11/08/15   Authorization Type April cert done [id number]   PT Start Time 1608   PT Stop Time 1655   PT Time Calculation (min) 47 min   Activity Tolerance Patient tolerated treatment well   Behavior During Therapy [facility] for tasks assessed/performed      Past Medical History  Diagnosis Date  . 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 ALLERGIC RHINITIS 08/03/2007  . GERD 02/18/2007  . KNEE PAIN, LEFT, CHRONIC 08/07/2008  . BACK PAIN 09/18/2009  . HAMMER TOE 08/03/2007  . CAROTID BRUIT, LEFT 08/07/2008  . 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 Foot drop     rt foot  . Rash, skin     torso  . Hepatitis 1977    no residual problems, type A  . Sleeping difficulty   . Skin cancer   . Lymphoma [facility])     Past Surgical History  Procedure Laterality Date  . Shoulder open rotator cuff repair  07/06/05    right  . Knee arthroscopy      left  . Tonsillectomy and adenoidectomy    . Transurethral resection of prostate    . Cataract extraction w/ intraocular lens  implant, bilateral Bilateral   . 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      Subjective Assessment - 10/29/15 1613    Subjective Kept the large stockings on from Sunday until last night when they got uncomfortable.  This moring, he put the new medium stockings on (20-30  mm Hg). Patient's son ordered the garments and donning butler on Sunday night.   Currently in Pain? Yes   Pain Score 2    Pain Location Foot   Pain Orientation Upper;Right   Aggravating Factors  snug shoe               LYMPHEDEMA/ONCOLOGY QUESTIONNAIRE - 10/29/15 1619    Right Lower Extremity Lymphedema   30 cm Proximal to Floor at Lateral Plantar Foot 28.7 cm   20 cm Proximal to Floor at Lateral Plantar Foot 25.2 1   10  cm Proximal to Floor at Lateral Malleoli 25.7 cm   5 cm Proximal to 1st MTP Joint 24 cm   Around Proximal Great Toe 8.5 cm   Other 40 cm. proximal to plantar foot 31.5                  [facility] Adult PT Treatment/Exercise - 10/29/15 0001    Manual Therapy   Manual therapy comments washed patient's right toes, foot, and leg while inspecting skin. Toes slightly red, seemingly from tightness of shoe.  Removed cotton that patient had placed at great toe medial aspect of nail with peroxide on it for ingrown nail.  Skin check is otherwise fine.   Edema Management circumference measurements taken  Manual Lymphatic Drainage (MLD) short neck, superficial and deep abdomen, right axilla and inguino-axillary anastomosis, and right leg from dorsal foot to lateral thigh   Compression Bandaging pt. applied compression stocking (medium 20-30 mm Hg) using donning butler; therapist adjusted stocking; patient put his AFO and shoe on                   Short Term Clinic Goals - 10/23/15 1730    CC Short Term Goal  #3   Title independent with home exercise program for right ankle strengthening   Status Achieved             Long Term Clinic Goals - 10/29/15 1714    CC Long Term Goal  #4   Title Pt. will be able to use an AFO to prevent foot drop and enable safe gait (or no AFO if not needed)   Status Achieved            Plan - 10/29/15 1711    Clinical Impression Statement Measurements of right toe, foot and lower leg today show that overall the  leg is smaller than on last visit but not as small as when measured prior to that after being bandaged.  But the foot and leg look pretty good, and I think using the newer size medium compression socks along with equipment he has ordered for edema controlled, he and his wife will be able to manage this at home.  He remains concerned about weakness in his right ankle, and I have continued to encourage him to do his strengthening exercises.  Pt. did report that the foot felt good in the shoe and felt better after treatment today than prior to treatment.   Rehab Potential Good   PT Frequency 3x / week   PT Duration 6 weeks   PT Treatment/Interventions ADLs/Self Care Home Management;Patient/family education;Manual lymph drainage;Manual techniques   PT Next Visit Plan Check goals.  Consider repeating lymphedema life impact scale soon.  Check current ankle muscle strength and adjust HEP if needed; continue manual lymph drainage and working on edema control.   PT Home Exercise Plan writing alphabet with his right foot 3x/day   Consulted and Agree with Plan of Care Patient      Patient will benefit from skilled therapeutic intervention in order to improve the following deficits and impairments:  Increased edema, Pain, Decreased strength, Abnormal gait, Decreased activity tolerance, Decreased knowledge of use of DME, Decreased knowledge of precautions  Visit Diagnosis: Edema, unspecified  Muscle weakness (generalized)     Problem List Patient Active Problem List   Diagnosis Date Noted  . Leukocytosis   . Pancreatic mass   . Abdominal pain 07/07/2015  . Pancreatitis 07/07/2015  . DVT (deep venous thrombosis) ([place]) 04/19/2015  . Abdominal fluid collection   . Sepsis ([place]) 04/18/2015  . Intra-abdominal abscess ([place]) 04/18/2015  . Cognitive deficits 03/26/2015  . Thrombocytopenia ([place]) 03/25/2015  . Decubitus ulcer of buttock, stage 2 03/25/2015  . Acute encephalopathy 03/24/2015  . 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 Rash 03/05/2015  . Malnutrition of moderate degree ([place]) 02/05/2015  . Foot drop 02/03/2015  . 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 Backache 09/18/2009  . 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 KNEE PAIN, LEFT, CHRONIC 08/07/2008  . CAROTID BRUIT, LEFT 08/07/2008  . Allergic rhinitis 08/03/2007  . 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 GERD 02/18/2007  . BPH associated with nocturia 02/18/2007    SALISBURY,DONNA  10/29/2015, 5:17 PM  [place], [place], [address] Phone: [phone]   Fax:  [phone]  Name: Perry Nielsen. MRN: [id number] Date of Birth: [birth year]    Serafina Royals, PT 10/29/2015 5:17 PM

## 2015-11-01 ENCOUNTER — Encounter: Payer: Self-pay | Admitting: Physical Therapy

## 2015-11-01 ENCOUNTER — Ambulatory Visit: Payer: Medicare Other | Admitting: Physical Therapy

## 2015-11-01 DIAGNOSIS — M79671 Pain in right foot: Secondary | ICD-10-CM | POA: Diagnosis not present

## 2015-11-01 DIAGNOSIS — R609 Edema, unspecified: Secondary | ICD-10-CM | POA: Diagnosis not present

## 2015-11-01 DIAGNOSIS — M2141 Flat foot [pes planus] (acquired), right foot: Secondary | ICD-10-CM | POA: Diagnosis not present

## 2015-11-01 DIAGNOSIS — M6281 Muscle weakness (generalized): Secondary | ICD-10-CM | POA: Diagnosis not present

## 2015-11-01 DIAGNOSIS — M25571 Pain in right ankle and joints of right foot: Secondary | ICD-10-CM | POA: Diagnosis not present

## 2015-11-01 DIAGNOSIS — R600 Localized edema: Secondary | ICD-10-CM | POA: Diagnosis not present

## 2015-11-01 NOTE — Therapy (Signed)
[place], [place], [address] Phone: [phone]   Fax:  [phone]  Physical Therapy Treatment  Patient Details  Name: Perry Nielsen. MRN: [id number] Date of Birth: Jan 27, 1941 Referring Provider: Dr. Julieanne Manson  Encounter Date: 11/01/2015      PT End of Session - 11/01/15 1153    Visit Number 13   Number of Visits 18   Date for PT Re-Evaluation 11/08/15   Authorization Type April cert done 8/25   PT Start Time 1055   PT Stop Time 1150   PT Time Calculation (min) 55 min   Activity Tolerance Patient tolerated treatment well   Behavior During Therapy [facility] for tasks assessed/performed      Past Medical History  Diagnosis Date  . 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 ALLERGIC RHINITIS 08/03/2007  . GERD 02/18/2007  . KNEE PAIN, LEFT, CHRONIC 08/07/2008  . BACK PAIN 09/18/2009  . HAMMER TOE 08/03/2007  . CAROTID BRUIT, LEFT 08/07/2008  . 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 Foot drop     rt foot  . Rash, skin     torso  . Hepatitis 1977    no residual problems, type A  . Sleeping difficulty   . Skin cancer   . Lymphoma [facility])     Past Surgical History  Procedure Laterality Date  . Shoulder open rotator cuff repair  07/06/05    right  . Knee arthroscopy      left  . Tonsillectomy and adenoidectomy    . Transurethral resection of prostate    . Cataract extraction w/ intraocular lens  implant, bilateral Bilateral   . 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      Subjective Assessment - 11/01/15 1058    Subjective Patient has received the velcro ankle and calf wrap from DME supplier. He is awaiting arrival of the toe cap and the butler to assist with  donning. He has been wearing the size medium stocking for management of edema.    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  Was diagnosed in early August.  Treatment has included radiation for 10 days and had 2 chemos; didn't tolerate the 6 that were planned.    Memory loss started about four weeks; trying to taper meds to ese if that helps.  Was healthy prior to this diagnosis.  Had blood clots in legs in October so is on Xarelto for six months.   Patient Stated Goals get help with drop foot   Currently in Pain? Yes   Pain Score 1    Pain Location Foot   Pain Orientation Right   Pain Descriptors / Indicators Aching            [facility] PT Assessment - 11/01/15 0001    Strength   Right Hip Flexion 3/5  or more; no resistance tried   Right/Left Ankle Right   Right Ankle Dorsiflexion 2+/5   Right Ankle Plantar Flexion 3/5   Right Ankle Inversion 3-/5   Right Ankle Eversion 2-/5  PT Education - 11/01/15 1152    Education provided Yes   Education Details Extensive education provided about how to don compression stocking since pt is having trouble with butler donning aid. Also educated on how to don/doff velcro compression LE garment and foot piece.    Person(s) Educated Patient;Spouse   Methods Explanation;Demonstration;Handout   Comprehension Verbalized understanding           Short Term Clinic Goals - 10/23/15 1730    CC Short Term Goal  #3   Title independent with home exercise program for right ankle strengthening   Status Achieved             Long Term Clinic Goals - 11/01/15 1057    CC Long Term Goal  #1   Title circumference of right foot at 5 cm. proximal to metatarsal head reduced to 24.5 cm.   Baseline 26.2 cm. at eval compared to 23.5 on the left, 10/11/15- 21 cm   Status Achieved   CC Long Term Goal   #2   Title pain in right foot improved at least 50% in frequency and/or intensitiy   Baseline 11/01/15- Pt states he has had 50% improvement in his right foot overall - pt had difficulty giving this a percentage but stated it was 50%   Status Achieved   CC Long Term Goal  #3   Title lymphedema life impact scale reduced to 30% or less impairment   Baseline 57% at eval, 11/01/2015- 41% impairment   Status On-going   CC Long Term Goal  #4   Title Pt. will be able to use an AFO to prevent foot drop and enable safe gait (or no AFO if not needed)   Status Achieved            Plan - 11/01/15 1153    Clinical Impression Statement Patient arrived with compression stocking incorrectly donned. The heel was on the lower calf and the top of the garment was folded over twice. [name] spouse reports the donning aid is not working and she can not get the heel in the right place. Spent extensive time educating pt and spouse how to don garment. Therapist unable to get garment to don correctly with donning aid and it appears that pt's lack of foot control may be attributing to this. Pt and spouse educated how to don garment without the aid of the butler. Pt and spouse educated about correct way to don/doff velcro compression leg and foot piece as well. Pt has almost met all lymphedema goals for therapy and then focus will shift to ankle and LE strengthening on right. Pt has very weak hip, knee and ankle musculature on R side.    Rehab Potential Good   PT Frequency 3x / week   PT Duration 6 weeks   PT Treatment/Interventions ADLs/Self Care Home Management;Patient/family education;Manual lymph drainage;Manual techniques   PT Next Visit Plan assess if wife is indep in donning velcro garments and compression stocking, once they are able to control edema indep focus will shift to R LE strengthening   PT Home Exercise Plan writing alphabet with his right foot 3x/day   Consulted and Agree with Plan of Care Patient       Patient will benefit from skilled therapeutic intervention in order to improve the following deficits and impairments:  Increased edema, Pain, Decreased strength, Abnormal gait, Decreased activity tolerance, Decreased knowledge of use of DME, Decreased knowledge of precautions  Visit Diagnosis: Edema, unspecified  Muscle weakness (  generalized)     Problem List Patient Active Problem List   Diagnosis Date Noted  . Leukocytosis   . Pancreatic mass   . Abdominal pain 07/07/2015  . Pancreatitis 07/07/2015  . DVT (deep venous thrombosis) ([place]) 04/19/2015  . Abdominal fluid collection   . Sepsis ([place]) 04/18/2015  . Intra-abdominal abscess ([place]) 04/18/2015  . Cognitive deficits 03/26/2015  . Thrombocytopenia ([place]) 03/25/2015  . Decubitus ulcer of buttock, stage 2 03/25/2015  . Acute encephalopathy 03/24/2015  . 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 Rash 03/05/2015  . Malnutrition of moderate degree ([place]) 02/05/2015  . Foot drop 02/03/2015  . 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 Backache 09/18/2009  . 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 KNEE PAIN, LEFT, CHRONIC 08/07/2008  . CAROTID BRUIT, LEFT 08/07/2008  . Allergic rhinitis 08/03/2007  . 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 GERD 02/18/2007  . BPH associated with nocturia 02/18/2007    Alexia Freestone 11/01/2015, 11:59 AM  [place], [place], [address] Phone: [phone]   Fax:  [phone]  Name: Perry Nielsen. MRN: [id number] Date of Birth: 1940/11/23    Allyson Sabal, PT 11/01/2015 11:59 AM

## 2015-11-04 ENCOUNTER — Ambulatory Visit: Payer: Medicare Other | Attending: Oncology | Admitting: Physical Therapy

## 2015-11-04 ENCOUNTER — Encounter: Payer: Self-pay | Admitting: Physical Therapy

## 2015-11-04 DIAGNOSIS — M6281 Muscle weakness (generalized): Secondary | ICD-10-CM | POA: Diagnosis not present

## 2015-11-04 DIAGNOSIS — R609 Edema, unspecified: Secondary | ICD-10-CM | POA: Diagnosis not present

## 2015-11-04 DIAGNOSIS — M25571 Pain in right ankle and joints of right foot: Secondary | ICD-10-CM | POA: Diagnosis not present

## 2015-11-04 DIAGNOSIS — R600 Localized edema: Secondary | ICD-10-CM | POA: Insufficient documentation

## 2015-11-04 DIAGNOSIS — R2689 Other abnormalities of gait and mobility: Secondary | ICD-10-CM | POA: Insufficient documentation

## 2015-11-04 NOTE — Therapy (Signed)
[place], [place], [address] Phone: [phone]   Fax:  [phone]  Physical Therapy Treatment  Patient Details  Name: Perry Nielsen. MRN: [id number] Date of Birth: 04-13-1941 Referring Provider: Dr. Julieanne Manson  Encounter Date: 11/04/2015      PT End of Session - 11/04/15 1715    Visit Number 14   Number of Visits 18   Date for PT Re-Evaluation 11/08/15   Authorization Type April cert done [id number]   PT Start Time 1522   PT Stop Time 1605   PT Time Calculation (min) 43 min   Activity Tolerance Patient tolerated treatment well   Behavior During Therapy [facility] for tasks assessed/performed      Past Medical History  Diagnosis Date  . 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 ALLERGIC RHINITIS 08/03/2007  . GERD 02/18/2007  . KNEE PAIN, LEFT, CHRONIC 08/07/2008  . BACK PAIN 09/18/2009  . HAMMER TOE 08/03/2007  . CAROTID BRUIT, LEFT 08/07/2008  . 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 Foot drop     rt foot  . Rash, skin     torso  . Hepatitis 1977    no residual problems, type A  . Sleeping difficulty   . Skin cancer   . Lymphoma [facility])     Past Surgical History  Procedure Laterality Date  . Shoulder open rotator cuff repair  07/06/05    right  . Knee arthroscopy      left  . Tonsillectomy and adenoidectomy    . Transurethral resection of prostate    . Cataract extraction w/ intraocular lens  implant, bilateral Bilateral   . 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      Subjective Assessment - 11/04/15 1525    Subjective [name] wife states she has not attempted to don the velcro garment at home because she had some questions. They have been managing his edema  through use of the compression stocking and pt has been sleeping in the compression stocking. [name] wife stated they received the toe cap and donner from DME supplier.    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  Was diagnosed in early August.  Treatment has included radiation for 10 days and had 2 chemos; didn't tolerate the 6 that were planned.    Memory loss started about four weeks; trying to taper meds to ese if that helps.  Was healthy prior to this diagnosis.  Had blood clots in legs in October so is on Xarelto for six months.   Patient Stated Goals get help with drop foot   Pain Score 1    Pain Location Foot   Pain Orientation Right   Pain Descriptors / Indicators Aching   Pain Frequency Intermittent               LYMPHEDEMA/ONCOLOGY QUESTIONNAIRE - 11/04/15 1532    Right Lower Extremity Lymphedema   30 cm Proximal to Floor at Lateral Plantar Foot 29.5 cm   20 cm Proximal to Floor at Lateral Plantar Foot 24 1   10  cm Proximal to Floor at Lateral Malleoli 21.9 cm  5 cm Proximal to 1st MTP Joint 23 cm   Around Proximal Great Toe 9.5 cm   Other 40 cm. proximal to plantar foot 29.5                          PT Education - 11/04/15 1714    Education provided Yes   Education Details Extensive education provided about how to don velcro compression garment and foot piece and how to don toe cap with pt's wife able to return demonstrate   Person(s) Educated Patient;Spouse   Methods Explanation;Demonstration;Verbal cues;Tactile cues   Comprehension Verbalized understanding;Returned demonstration;Verbal cues required;Tactile cues required           Short Term Clinic Goals - 10/23/15 1730    CC Short Term Goal  #3   Title independent with home exercise program for right ankle strengthening   Status Achieved             Long Term Clinic Goals  - 11/01/15 1057    CC Long Term Goal  #1   Title circumference of right foot at 5 cm. proximal to metatarsal head reduced to 24.5 cm.   Baseline 26.2 cm. at eval compared to 23.5 on the left, 10/11/15- 21 cm   Status Achieved   CC Long Term Goal  #2   Title pain in right foot improved at least 50% in frequency and/or intensitiy   Baseline 11/01/15- Pt states he has had 50% improvement in his right foot overall - pt had difficulty giving this a percentage but stated it was 50%   Status Achieved   CC Long Term Goal  #3   Title lymphedema life impact scale reduced to 30% or less impairment   Baseline 57% at eval, 11/01/2015- 41% impairment   Status On-going   CC Long Term Goal  #4   Title Pt. will be able to use an AFO to prevent foot drop and enable safe gait (or no AFO if not needed)   Status Achieved            Plan - 11/04/15 1717    Clinical Impression Statement Circumferential measurements taken today. Compression stocking is managing pt's edema. Pt and spouse were educated on how to don velcro compression garment because pt did not attempt over the weekend and his spouse had questions about correct way to don. She was able to don pt's compression stocking with donning butler and the heel was in the appropriate spot. Pt arrived today with compression stocking correctly donned. Pt was able to return demonstrate correct way to don velcro compression garment and toe cap which patient will wear overnight. Focus now will be placed on strengthening of RLE and ankle.    Rehab Potential Good   PT Frequency 3x / week   PT Duration 6 weeks   PT Treatment/Interventions ADLs/Self Care Home Management;Patient/family education;Manual lymph drainage;Manual techniques   PT Next Visit Plan pt is due for a recert soon and it may be good to do next visit with focus on RLE strengthening and R ankle strengthening, make sure wife is able to indep don compression garments.    PT Home Exercise Plan writing  alphabet with his right foot 3x/day   Consulted and Agree with Plan of Care Patient      Patient will benefit from skilled therapeutic intervention in order to improve the following deficits and impairments:  Increased edema, Pain, Decreased strength, Abnormal gait, Decreased activity tolerance, Decreased  knowledge of use of DME, Decreased knowledge of precautions  Visit Diagnosis: Edema, unspecified     Problem List Patient Active Problem List   Diagnosis Date Noted  . Leukocytosis   . Pancreatic mass   . Abdominal pain 07/07/2015  . Pancreatitis 07/07/2015  . DVT (deep venous thrombosis) ([place]) 04/19/2015  . Abdominal fluid collection   . Sepsis ([place]) 04/18/2015  . Intra-abdominal abscess ([place]) 04/18/2015  . Cognitive deficits 03/26/2015  . Thrombocytopenia ([place]) 03/25/2015  . Decubitus ulcer of buttock, stage 2 03/25/2015  . Acute encephalopathy 03/24/2015  . 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 Rash 03/05/2015  . Malnutrition of moderate degree ([place]) 02/05/2015  . Foot drop 02/03/2015  . 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 Backache 09/18/2009  . 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 KNEE PAIN, LEFT, CHRONIC 08/07/2008  . CAROTID BRUIT, LEFT 08/07/2008  . Allergic rhinitis 08/03/2007  . 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 GERD 02/18/2007  . BPH associated with nocturia 02/18/2007    Alexia Freestone 11/04/2015, 5:22 PM  [place], [place], [address] Phone: [phone]   Fax:  [phone]  Name: Perry Nielsen. MRN: [id number] Date of Birth: [birth year]    Allyson Sabal, PT 11/04/2015 5:22 PM

## 2015-11-06 ENCOUNTER — Ambulatory Visit: Payer: Medicare Other | Admitting: Physical Therapy

## 2015-11-06 DIAGNOSIS — M25571 Pain in right ankle and joints of right foot: Secondary | ICD-10-CM | POA: Diagnosis not present

## 2015-11-06 DIAGNOSIS — R609 Edema, unspecified: Secondary | ICD-10-CM

## 2015-11-06 DIAGNOSIS — R600 Localized edema: Secondary | ICD-10-CM | POA: Diagnosis not present

## 2015-11-06 DIAGNOSIS — M6281 Muscle weakness (generalized): Secondary | ICD-10-CM | POA: Diagnosis not present

## 2015-11-06 DIAGNOSIS — R2689 Other abnormalities of gait and mobility: Secondary | ICD-10-CM | POA: Diagnosis not present

## 2015-11-06 NOTE — Therapy (Signed)
[place], [place], [address] Phone: [phone]   Fax:  [phone]  Physical Therapy Treatment  Patient Details  Name: Perry Nielsen. MRN: [id number] Date of Birth: 10/25/1940 Referring Provider: Dr. Julieanne Manson  Encounter Date: 11/06/2015      PT End of Session - 11/06/15 1710    Visit Number 15   Number of Visits 25   Date for PT Re-Evaluation 12/07/15   PT Start Time [id number]   PT Stop Time 1345   PT Time Calculation (min) 46 min   Activity Tolerance Patient tolerated treatment well   Behavior During Therapy [facility] for tasks assessed/performed      Past Medical History  Diagnosis Date  . 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 ALLERGIC RHINITIS 08/03/2007  . GERD 02/18/2007  . KNEE PAIN, LEFT, CHRONIC 08/07/2008  . BACK PAIN 09/18/2009  . HAMMER TOE 08/03/2007  . CAROTID BRUIT, LEFT 08/07/2008  . 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 Foot drop     rt foot  . Rash, skin     torso  . Hepatitis 1977    no residual problems, type A  . Sleeping difficulty   . Skin cancer   . Lymphoma [facility])     Past Surgical History  Procedure Laterality Date  . Shoulder open rotator cuff repair  07/06/05    right  . Knee arthroscopy      left  . Tonsillectomy and adenoidectomy    . Transurethral resection of prostate    . Cataract extraction w/ intraocular lens  implant, bilateral Bilateral   . 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      Subjective Assessment - 11/06/15 1259    Subjective "I'm tired today.  Didn't sleep well, not well at all with that footware on."   Currently in Pain? No/denies            [facility] PT Assessment - 11/06/15 0001    Strength   Right  Hip Flexion 3+/5   Right Hip Extension 2+/5   Right Hip ABduction 3-/5   Right Hip ADduction 3-/5   Left Hip Flexion 3+/5   Left Hip Extension 3-/5   Left Hip ABduction 3+/5   Left Hip ADduction 3-/5   Right/Left Knee Right;Left   Right Knee Flexion 3+/5   Right Knee Extension 3+/5   Left Knee Flexion 5/5   Left Knee Extension 4/5                     [facility] Adult PT Treatment/Exercise - 11/06/15 0001    Self-Care   Other Self-Care Comments  Worked with patient and especially with his wife on donning the toe caps, foot piece, leg stocking, and the Juzo velcro compression garments to calf and foot.  Wife had had trouble getting this on last night, but seemed to benefit from some input about how to put these on and about donning sequence.                PT Education - 11/06/15 1709    Education provided Yes   Education Details Further education to patient's wife about putting the velcro compression garments on patient.   Person(s)  Educated Patient;Spouse   Methods Explanation;Demonstration;Tactile cues;Verbal cues   Comprehension Verbalized understanding;Returned demonstration           Short Term Clinic Goals - 11/06/15 1729    CC Short Term Goal  #4   Title independent in home exercise program for bilateral LE strengthening   Time 4   Period Weeks   Status New             Long Term Clinic Goals - 11/06/15 1730    CC Long Term Goal  #5   Title right LE strength increased to grossly 3+/5 or more throughout   Time 8   Period Weeks   Status New   CC Long Term Goal  #6   Title left LE strength increased to grossly 4/5 throughout   Time 8   Period Weeks   Status New   CC Long Term Goal  #7   Title independent with more advanced LE strengthening and mobility exercise program   Time 8   Period Weeks   Status New   Additional Goals   Additional Goals Yes            Plan - 11/06/15 1725    Clinical Impression Statement Right foot looks  good today.  Patient and his wife still having some trouble getting garments on and then intermittent trouble tolerating them; these issues were addressed today.  Patient's LE strength was further tested today and results recorded; his strength is in the 3-/5 range in much of right LE and 3+/5 on left, so he will benefit from strengthening, and the plan is to focus on this going forward, with work on control of swelling and garment education as needed.     Rehab Potential Good   PT Frequency 2x / week   PT Duration 8 weeks   PT Treatment/Interventions ADLs/Self Care Home Management;Patient/family education;Manual techniques   PT Next Visit Plan Begin LE strengthening exercise and HEP instruction; work on edema control only if needs arise, with patient and his wife managing this at home.   Consulted and Agree with Plan of Care Patient      Patient will benefit from skilled therapeutic intervention in order to improve the following deficits and impairments:  Increased edema, Pain, Decreased strength, Abnormal gait, Decreased activity tolerance, Decreased knowledge of use of DME, Decreased knowledge of precautions  Visit Diagnosis: Edema, unspecified - Plan: PT plan of care cert/re-cert  Muscle weakness (generalized) - Plan: PT plan of care cert/re-cert  Pain in right ankle and joints of right foot - Plan: PT plan of care cert/re-cert     Problem List Patient Active Problem List   Diagnosis Date Noted  . Leukocytosis   . Pancreatic mass   . Abdominal pain 07/07/2015  . Pancreatitis 07/07/2015  . DVT (deep venous thrombosis) ([place]) 04/19/2015  . Abdominal fluid collection   . Sepsis ([place]) 04/18/2015  . Intra-abdominal abscess ([place]) 04/18/2015  . Cognitive deficits 03/26/2015  . Thrombocytopenia ([place]) 03/25/2015  . Decubitus ulcer of buttock, stage 2 03/25/2015  . Acute encephalopathy 03/24/2015  . 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 Rash 03/05/2015  . Malnutrition of moderate  degree ([place]) 02/05/2015  . Foot drop 02/03/2015  . 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 Backache 09/18/2009  . 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 KNEE PAIN, LEFT, CHRONIC 08/07/2008  . CAROTID BRUIT, LEFT 08/07/2008  . Allergic rhinitis 08/03/2007  . 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 GERD 02/18/2007  .  BPH associated with nocturia 02/18/2007    SALISBURY,DONNA 11/06/2015, 5:34 PM  [place], [place], [address] Phone: [phone]   Fax:  [phone]  Name: Dequincy Clemensen. MRN: [id number] Date of Birth: 1940/07/13    Serafina Royals, PT 11/06/2015 5:34 PM

## 2015-11-08 ENCOUNTER — Ambulatory Visit: Payer: Medicare Other | Admitting: Physical Therapy

## 2015-11-08 DIAGNOSIS — M25571 Pain in right ankle and joints of right foot: Secondary | ICD-10-CM | POA: Diagnosis not present

## 2015-11-08 DIAGNOSIS — R609 Edema, unspecified: Secondary | ICD-10-CM | POA: Diagnosis not present

## 2015-11-08 DIAGNOSIS — M6281 Muscle weakness (generalized): Secondary | ICD-10-CM

## 2015-11-08 DIAGNOSIS — R600 Localized edema: Secondary | ICD-10-CM

## 2015-11-08 DIAGNOSIS — R2689 Other abnormalities of gait and mobility: Secondary | ICD-10-CM | POA: Diagnosis not present

## 2015-11-08 NOTE — Therapy (Addendum)
[place], [place], [address] Phone: [phone]   Fax:  [phone]  Physical Therapy Treatment  Patient Details  Name: Perry Nielsen. MRN: [id number] Date of Birth: 10-06-1940 Referring Provider: Dr. Julieanne Manson  Encounter Date: 11/08/2015      PT End of Session - 11/11/15 1650    Visit Number 17   Number of Visits 25   Date for PT Re-Evaluation 12/07/15   Authorization Type April cert done [id number]   PT Start Time 0400   PT Stop Time 0445   PT Time Calculation (min) 45 min   Activity Tolerance Patient tolerated treatment well   Behavior During Therapy [facility] for tasks assessed/performed      Past Medical History  Diagnosis Date  . 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 ALLERGIC RHINITIS 08/03/2007  . GERD 02/18/2007  . KNEE PAIN, LEFT, CHRONIC 08/07/2008  . BACK PAIN 09/18/2009  . HAMMER TOE 08/03/2007  . CAROTID BRUIT, LEFT 08/07/2008  . 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 Foot drop     rt foot  . Rash, skin     torso  . Hepatitis 1977    no residual problems, type A  . Sleeping difficulty   . Skin cancer   . Lymphoma [facility])     Past Surgical History  Procedure Laterality Date  . Shoulder open rotator cuff repair  07/06/05    right  . Knee arthroscopy      left  . Tonsillectomy and adenoidectomy    . Transurethral resection of prostate    . Cataract extraction w/ intraocular lens  implant, bilateral Bilateral   . 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      Subjective Assessment - 11/11/15 1603    Subjective Pt states his foot is sore today and he did not wear any compression to bed last night. Pt states he was just not comfortable and he took it  off. Pt also reports pain in his lateral upper thigh when he tries to sleep.    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  Was diagnosed in early August.  Treatment has included radiation for 10 days and had 2 chemos; didn't tolerate the 6 that were planned.    Memory loss started about four weeks; trying to taper meds to ese if that helps.  Was healthy prior to this diagnosis.  Had blood clots in legs in October so is on Xarelto for six months.   Patient Stated Goals get help with drop foot   Currently in Pain? Yes   Pain Score 2    Pain Location Foot   Pain Orientation Right   Pain Descriptors / Indicators Aching                         [facility] Adult PT Treatment/Exercise - 11/11/15 0001    Ambulation/Gait   Ambulation/Gait Yes   Ambulation/Gait Assistance 6: Modified independent (Device/Increase time)   Ambulation Distance (Feet) 150 Feet  x4   Assistive device Standard walker   Gait Comments focus on looking straight ahead and not putting walker too far out ahead  Knee/Hip Exercises: Seated   Ball Squeeze with small football with 3 sec holds x 10 reps   Clamshell with TheraBand Yellow  x 10 reps   Other Seated Knee/Hip Exercises seated short arc quads 1# x 10 each x 2   Marching Both;10 reps;Strengthening;Weights  1 lb weight   Hamstring Curl Strengthening;Both;10 reps  yellow theraband   Sit to Sand 5 reps;with UE support;2 sets  pt unable to stand with support of 1 hand only                 PT Education - 11/11/15 1650    Education provided Yes   Education Details education about purchasing a front wheeled walker   Person(s) Educated Patient;Spouse   Methods Explanation   Comprehension Verbalized understanding           Short Term Clinic Goals - 11/06/15 1729    CC Short Term Goal  #4   Title independent in home exercise program for bilateral LE strengthening   Time 4    Period Weeks   Status New             Long Term Clinic Goals - 11/08/15 1248    CC Long Term Goal  #5   Title right LE strength increased to grossly 3+/5 or more throughout   Status On-going   CC Long Term Goal  #6   Title left LE strength increased to grossly 4/5 throughout   Status On-going   CC Long Term Goal  #7   Title independent with more advanced LE strengthening and mobility exercise program   Status On-going            Plan - 11/11/15 1650    Clinical Impression Statement Pt is currentlly using a standard walker because he feels his rollator gets away from him. The standard walker is slowing him down and he would benefit from a front wheeled walker. Pt and spouse were educated about purchasing a front wheeled walker at DME store and also educated about proper sizing/fit. Gait training performed today with emphasis on foot clearence, heel /toe motion, and upright posture as well as keeping walker close while ambulating. [name] O2 sats were 96-97% throughout the treatment session and heart rate peaked at 116 bpm following ambuation.    Rehab Potential Good   PT Frequency 2x / week   PT Duration 8 weeks   PT Treatment/Interventions ADLs/Self Care Home Management;Patient/family education;Therapeutic exercise   PT Next Visit Plan Try sit to/from stand from elevated mat; also try long arc quads there, Continue exercise for LE strengthening.  Monitor O2 sats, gait training with front wheeled walker   Consulted and Agree with Plan of Care Patient      Patient will benefit from skilled therapeutic intervention in order to improve the following deficits and impairments:  Increased edema, Pain, Decreased strength, Abnormal gait, Decreased activity tolerance, Decreased knowledge of use of DME, Decreased knowledge of precautions  Visit Diagnosis: Muscle weakness (generalized) - Plan: PT plan of care cert/re-cert  Localized edema - Plan: PT plan of care cert/re-cert  Other  abnormalities of gait and mobility - Plan: PT plan of care cert/re-cert     Problem List Patient Active Problem List   Diagnosis Date Noted  . Leukocytosis   . Pancreatic mass   . Abdominal pain 07/07/2015  . Pancreatitis 07/07/2015  . DVT (deep venous thrombosis) ([place]) 04/19/2015  . Abdominal fluid collection   . Sepsis ([place]) 04/18/2015  . Intra-abdominal  abscess ([place]) 04/18/2015  . Cognitive deficits 03/26/2015  . Thrombocytopenia ([place]) 03/25/2015  . Decubitus ulcer of buttock, stage 2 03/25/2015  . Acute encephalopathy 03/24/2015  . 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 Rash 03/05/2015  . Malnutrition of moderate degree ([place]) 02/05/2015  . Foot drop 02/03/2015  . 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 Backache 09/18/2009  . 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 KNEE PAIN, LEFT, CHRONIC 08/07/2008  . CAROTID BRUIT, LEFT 08/07/2008  . Allergic rhinitis 08/03/2007  . 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 GERD 02/18/2007  . BPH associated with nocturia 02/18/2007    Taren Dymek 11/11/2015, 5:04 PM  [place], [place], [address] Phone: [phone]   Fax:  [phone]  Name: Sabas Sabet. MRN: [id number] Date of Birth: 02-04-1941    Serafina Royals, PT 11/11/2015 5:04 PM

## 2015-11-11 ENCOUNTER — Encounter: Payer: Self-pay | Admitting: Physical Therapy

## 2015-11-11 ENCOUNTER — Other Ambulatory Visit: Payer: Self-pay | Admitting: Family Medicine

## 2015-11-11 ENCOUNTER — Ambulatory Visit: Payer: Medicare Other | Admitting: Physical Therapy

## 2015-11-11 DIAGNOSIS — M6281 Muscle weakness (generalized): Secondary | ICD-10-CM

## 2015-11-11 DIAGNOSIS — R609 Edema, unspecified: Secondary | ICD-10-CM | POA: Diagnosis not present

## 2015-11-11 DIAGNOSIS — M25571 Pain in right ankle and joints of right foot: Secondary | ICD-10-CM | POA: Diagnosis not present

## 2015-11-11 DIAGNOSIS — R2689 Other abnormalities of gait and mobility: Secondary | ICD-10-CM | POA: Diagnosis not present

## 2015-11-11 DIAGNOSIS — R600 Localized edema: Secondary | ICD-10-CM | POA: Diagnosis not present

## 2015-11-11 NOTE — Addendum Note (Signed)
Addended by: Jomarie Longs on: 11/11/2015 05:04 PM   Modules accepted: Orders

## 2015-11-11 NOTE — Therapy (Signed)
[place], [place], [address] Phone: [phone]   Fax:  [phone]  Physical Therapy Treatment  Patient Details  Name: Perry Nielsen. MRN: [id number] Date of Birth: [birth year] Referring Provider: Dr. Julieanne Manson  Encounter Date: 11/11/2015      PT End of Session - 11/11/15 1650    Visit Number 17   Number of Visits 25   Date for PT Re-Evaluation 12/07/15   Authorization Type April cert done [id number]   PT Start Time 0400   PT Stop Time 0445   PT Time Calculation (min) 45 min   Activity Tolerance Patient tolerated treatment well   Behavior During Therapy [facility] for tasks assessed/performed      Past Medical History  Diagnosis Date  . 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 ALLERGIC RHINITIS 08/03/2007  . GERD 02/18/2007  . KNEE PAIN, LEFT, CHRONIC 08/07/2008  . BACK PAIN 09/18/2009  . HAMMER TOE 08/03/2007  . CAROTID BRUIT, LEFT 08/07/2008  . 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 Foot drop     rt foot  . Rash, skin     torso  . Hepatitis 1977    no residual problems, type A  . Sleeping difficulty   . Skin cancer   . Lymphoma [facility])     Past Surgical History  Procedure Laterality Date  . Shoulder open rotator cuff repair  07/06/05    right  . Knee arthroscopy      left  . Tonsillectomy and adenoidectomy    . Transurethral resection of prostate    . Cataract extraction w/ intraocular lens  implant, bilateral Bilateral   . 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      Subjective Assessment - 11/11/15 1603    Subjective Pt states his foot is sore today and he did not wear any compression to bed last night. Pt states he was just not comfortable and he took it  off. Pt also reports pain in his lateral upper thigh when he tries to sleep.    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  Was diagnosed in early August.  Treatment has included radiation for 10 days and had 2 chemos; didn't tolerate the 6 that were planned.    Memory loss started about four weeks; trying to taper meds to ese if that helps.  Was healthy prior to this diagnosis.  Had blood clots in legs in October so is on Xarelto for six months.   Patient Stated Goals get help with drop foot   Currently in Pain? Yes   Pain Score 2    Pain Location Foot   Pain Orientation Right   Pain Descriptors / Indicators Aching                         [facility] Adult PT Treatment/Exercise - 11/11/15 0001    Ambulation/Gait   Ambulation/Gait Yes   Ambulation/Gait Assistance 6: Modified independent (Device/Increase time)   Ambulation Distance (Feet) 150 Feet  x4   Assistive device Standard walker   Gait Comments focus on looking straight ahead and not putting walker too far out ahead  Knee/Hip Exercises: Seated   Ball Squeeze with small football with 3 sec holds x 10 reps   Clamshell with TheraBand Yellow  x 10 reps   Other Seated Knee/Hip Exercises seated short arc quads 1# x 10 each x 2   Marching Both;10 reps;Strengthening;Weights  1 lb weight   Hamstring Curl Strengthening;Both;10 reps  yellow theraband   Sit to Sand 5 reps;with UE support;2 sets  pt unable to stand with support of 1 hand only                 PT Education - 11/11/15 1650    Education provided Yes   Education Details education about purchasing a front wheeled walker   Person(s) Educated Patient;Spouse   Methods Explanation   Comprehension Verbalized understanding           Short Term Clinic Goals - 11/06/15 1729    CC Short Term Goal  #4   Title independent in home exercise program for bilateral LE strengthening   Time 4    Period Weeks   Status New             Long Term Clinic Goals - 11/08/15 1248    CC Long Term Goal  #5   Title right LE strength increased to grossly 3+/5 or more throughout   Status On-going   CC Long Term Goal  #6   Title left LE strength increased to grossly 4/5 throughout   Status On-going   CC Long Term Goal  #7   Title independent with more advanced LE strengthening and mobility exercise program   Status On-going            Plan - 11/11/15 1650    Clinical Impression Statement Pt is currentlly using a standard walker because he feels his rollator gets away from him. The standard walker is slowing him down and he would benefit from a front wheeled walker. Pt and spouse were educated about purchasing a front wheeled walker at DME store and also educated about proper sizing/fit. Gait training performed today with emphasis on foot clearence, heel /toe motion, and upright posture as well as keeping walker close while ambulating. [name] O2 sats were 96-97% throughout the treatment session and heart rate peaked at 116 bpm following ambuation.    Rehab Potential Good   PT Frequency 2x / week   PT Duration 8 weeks   PT Treatment/Interventions ADLs/Self Care Home Management;Patient/family education;Therapeutic exercise   PT Next Visit Plan Try sit to/from stand from elevated mat; also try long arc quads there, Continue exercise for LE strengthening.  Monitor O2 sats, gait training with front wheeled walker   Consulted and Agree with Plan of Care Patient      Patient will benefit from skilled therapeutic intervention in order to improve the following deficits and impairments:  Increased edema, Pain, Decreased strength, Abnormal gait, Decreased activity tolerance, Decreased knowledge of use of DME, Decreased knowledge of precautions  Visit Diagnosis: Muscle weakness (generalized)     Problem List Patient Active Problem List   Diagnosis Date Noted  . Leukocytosis   .  Pancreatic mass   . Abdominal pain 07/07/2015  . Pancreatitis 07/07/2015  . DVT (deep venous thrombosis) ([place]) 04/19/2015  . Abdominal fluid collection   . Sepsis ([place]) 04/18/2015  . Intra-abdominal abscess ([place]) 04/18/2015  . Cognitive deficits 03/26/2015  . Thrombocytopenia ([place]) 03/25/2015  . Decubitus ulcer of buttock, stage 2 03/25/2015  . Acute encephalopathy 03/24/2015  . Lymphoma, large  cell ([place]) 03/07/2015  . Rash 03/05/2015  . Malnutrition of moderate degree ([place]) 02/05/2015  . Foot drop 02/03/2015  . 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 Backache 09/18/2009  . 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 KNEE PAIN, LEFT, CHRONIC 08/07/2008  . CAROTID BRUIT, LEFT 08/07/2008  . Allergic rhinitis 08/03/2007  . 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 GERD 02/18/2007  . BPH associated with nocturia 02/18/2007    Alexia Freestone 11/11/2015, 4:58 PM  [place], [place], [address] Phone: [phone]   Fax:  [phone]  Name: Perry Nielsen. MRN: [id number] Date of Birth: 1941/04/06    Allyson Sabal, PT 11/11/2015 4:58 PM

## 2015-11-13 ENCOUNTER — Encounter: Payer: Self-pay | Admitting: Physical Therapy

## 2015-11-13 ENCOUNTER — Ambulatory Visit: Payer: Medicare Other | Admitting: Physical Therapy

## 2015-11-14 ENCOUNTER — Encounter: Payer: Self-pay | Admitting: Physical Therapy

## 2015-11-14 ENCOUNTER — Ambulatory Visit: Payer: Medicare Other | Admitting: Physical Therapy

## 2015-11-14 DIAGNOSIS — M25571 Pain in right ankle and joints of right foot: Secondary | ICD-10-CM | POA: Diagnosis not present

## 2015-11-14 DIAGNOSIS — R2689 Other abnormalities of gait and mobility: Secondary | ICD-10-CM | POA: Diagnosis not present

## 2015-11-14 DIAGNOSIS — R609 Edema, unspecified: Secondary | ICD-10-CM | POA: Diagnosis not present

## 2015-11-14 DIAGNOSIS — M6281 Muscle weakness (generalized): Secondary | ICD-10-CM

## 2015-11-14 DIAGNOSIS — R600 Localized edema: Secondary | ICD-10-CM | POA: Diagnosis not present

## 2015-11-14 NOTE — Therapy (Signed)
[place], [place], [address] Phone: [phone]   Fax:  [phone]  Physical Therapy Treatment  Patient Details  Name: Perry Nielsen. MRN: [id number] Date of Birth: 26-Feb-1941 Referring Provider: Dr. Julieanne Manson  Encounter Date: 11/14/2015      PT End of Session - 11/14/15 1206    Visit Number 18   Number of Visits 25   Date for PT Re-Evaluation 12/07/15   Authorization Type April cert done [id number]   PT Start Time 1016   PT Stop Time 1100   PT Time Calculation (min) 44 min   Activity Tolerance Patient tolerated treatment well   Behavior During Therapy [facility] for tasks assessed/performed      Past Medical History  Diagnosis Date  . 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 ALLERGIC RHINITIS 08/03/2007  . GERD 02/18/2007  . KNEE PAIN, LEFT, CHRONIC 08/07/2008  . BACK PAIN 09/18/2009  . HAMMER TOE 08/03/2007  . CAROTID BRUIT, LEFT 08/07/2008  . 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 Foot drop     rt foot  . Rash, skin     torso  . Hepatitis 1977    no residual problems, type A  . Sleeping difficulty   . Skin cancer   . Lymphoma [facility])     Past Surgical History  Procedure Laterality Date  . Shoulder open rotator cuff repair  07/06/05    right  . Knee arthroscopy      left  . Tonsillectomy and adenoidectomy    . Transurethral resection of prostate    . Cataract extraction w/ intraocular lens  implant, bilateral Bilateral   . 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      Subjective Assessment - 11/14/15 1022    Subjective Pt reports he has some soreness in his legs today and in his groin. He has been exercising at home sitting in the chair. Pt purchased ankle  weights and has been using them.    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  Was diagnosed in early August.  Treatment has included radiation for 10 days and had 2 chemos; didn't tolerate the 6 that were planned.    Memory loss started about four weeks; trying to taper meds to ese if that helps.  Was healthy prior to this diagnosis.  Had blood clots in legs in October so is on Xarelto for six months.   Patient Stated Goals get help with drop foot   Currently in Pain? No/denies   Pain Score 0-No pain                         [facility] Adult PT Treatment/Exercise - 11/14/15 0001    Ambulation/Gait   Ambulation/Gait Yes   Ambulation/Gait Assistance 6: Modified independent (Device/Increase time)   Ambulation Distance (Feet) 250 Feet   Assistive device Rolling walker   Gait Comments focus on posture and looking straight ahead   Knee/Hip Exercises: Seated   Ball Squeeze with small football with 3 sec holds x 15 reps   Clamshell with TheraBand Red  x 20 reps   Other Seated Knee/Hip Exercises seated short arc  quads 3# on L and 1# on Rx 10 each x 2   Marching Both;10 reps;Strengthening;Weights  3 lb weight   Hamstring Curl Strengthening;Both;10 reps;2 sets  yellow theraband   Sit to Sand Other (comment);2 sets  8 reps with 2 using bil hand support and 6 using only 1 hand                   Short Term Clinic Goals - 11/06/15 1729    CC Short Term Goal  #4   Title independent in home exercise program for bilateral LE strengthening   Time 4   Period Weeks   Status New             Long Term Clinic Goals - 11/08/15 1248    CC Long Term Goal  #5   Title right LE strength increased to grossly 3+/5 or more throughout   Status On-going   CC Long Term Goal  #6   Title left LE strength increased to grossly 4/5 throughout   Status On-going   CC Long Term Goal  #7   Title independent with more  advanced LE strengthening and mobility exercise program   Status On-going            Plan - 11/14/15 1208    Clinical Impression Statement Pt and spouse purchased a front wheeled rolling walker from a DME supplier. Pt was able to tolerate increased repeitions with increased resistance today for all exercises. His heart rate below 109bpm and his O2 did not go below 96%. Pt demonstrated increased independence with ambuation with front wheeled walker. He still required cueing for looking straight ahead but demonstrated improved step length and heel toe gait pattern. Pt also was able to sit to stand with use of only 1 UE for support.    Rehab Potential Good   PT Frequency 2x / week   PT Duration 8 weeks   PT Treatment/Interventions ADLs/Self Care Home Management;Patient/family education;Therapeutic exercise   PT Next Visit Plan continue sit to stands with 1UE for support decreasing to no UE, LE strengthening, gait training    Consulted and Agree with Plan of Care Patient      Patient will benefit from skilled therapeutic intervention in order to improve the following deficits and impairments:  Increased edema, Pain, Decreased strength, Abnormal gait, Decreased activity tolerance, Decreased knowledge of use of DME, Decreased knowledge of precautions  Visit Diagnosis: Muscle weakness (generalized)     Problem List Patient Active Problem List   Diagnosis Date Noted  . Leukocytosis   . Pancreatic mass   . Abdominal pain 07/07/2015  . Pancreatitis 07/07/2015  . DVT (deep venous thrombosis) ([place]) 04/19/2015  . Abdominal fluid collection   . Sepsis ([place]) 04/18/2015  . Intra-abdominal abscess ([place]) 04/18/2015  . Cognitive deficits 03/26/2015  . Thrombocytopenia ([place]) 03/25/2015  . Decubitus ulcer of buttock, stage 2 03/25/2015  . Acute encephalopathy 03/24/2015  . 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 Rash 03/05/2015  . Malnutrition of moderate degree ([place]) 02/05/2015  . Foot  drop 02/03/2015  . 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 Backache 09/18/2009  . 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 KNEE PAIN, LEFT, CHRONIC 08/07/2008  . CAROTID BRUIT, LEFT 08/07/2008  . Allergic rhinitis 08/03/2007  . 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 GERD 02/18/2007  . BPH associated with nocturia 02/18/2007    Alexia Freestone 11/14/2015, 12:15 PM  [place], [place], [address] Phone: [phone]   Fax:  [phone]  Name: Perry Nielsen. MRN: [id number] Date of Birth: [birth year]    Allyson Sabal, PT 11/14/2015 12:16 PM

## 2015-11-15 ENCOUNTER — Encounter: Payer: Self-pay | Admitting: Physical Therapy

## 2015-11-19 ENCOUNTER — Ambulatory Visit: Payer: Medicare Other | Admitting: Physical Therapy

## 2015-11-20 ENCOUNTER — Ambulatory Visit: Payer: Medicare Other | Admitting: Physical Therapy

## 2015-11-20 DIAGNOSIS — R609 Edema, unspecified: Secondary | ICD-10-CM | POA: Diagnosis not present

## 2015-11-20 DIAGNOSIS — R2689 Other abnormalities of gait and mobility: Secondary | ICD-10-CM

## 2015-11-20 DIAGNOSIS — M25571 Pain in right ankle and joints of right foot: Secondary | ICD-10-CM | POA: Diagnosis not present

## 2015-11-20 DIAGNOSIS — M6281 Muscle weakness (generalized): Secondary | ICD-10-CM | POA: Diagnosis not present

## 2015-11-20 DIAGNOSIS — R600 Localized edema: Secondary | ICD-10-CM | POA: Diagnosis not present

## 2015-11-20 NOTE — Therapy (Signed)
[place], [place], [address] Phone: [phone]   Fax:  [phone]  Physical Therapy Treatment  Patient Details  Name: Perry Nielsen. MRN: [id number] Date of Birth: [birth year] Referring Provider: Dr. Julieanne Manson  Encounter Date: 11/20/2015      PT End of Session - 11/20/15 1602    Visit Number 19   Number of Visits 25   Date for PT Re-Evaluation 12/07/15   PT Start Time [id number]   PT Stop Time 1436   PT Time Calculation (min) 47 min   Activity Tolerance Patient tolerated treatment well   Behavior During Therapy [facility] for tasks assessed/performed      Past Medical History  Diagnosis Date  . 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 ALLERGIC RHINITIS 08/03/2007  . GERD 02/18/2007  . KNEE PAIN, LEFT, CHRONIC 08/07/2008  . BACK PAIN 09/18/2009  . HAMMER TOE 08/03/2007  . CAROTID BRUIT, LEFT 08/07/2008  . 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 Foot drop     rt foot  . Rash, skin     torso  . Hepatitis 1977    no residual problems, type A  . Sleeping difficulty   . Skin cancer   . Lymphoma [facility])     Past Surgical History  Procedure Laterality Date  . Shoulder open rotator cuff repair  07/06/05    right  . Knee arthroscopy      left  . Tonsillectomy and adenoidectomy    . Transurethral resection of prostate    . Cataract extraction w/ intraocular lens  implant, bilateral Bilateral   . 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      Subjective Assessment - 11/20/15 1353    Subjective "I've been swimming Monday and today; will go again tomorrow.  Swam a lap, then walked a lap.  Stayed almost an hour, saying he went easy. Doesn't like the new walker--feels it's a  "sissy walker."     Currently in Pain? No/denies                         [facility] Adult PT Treatment/Exercise - 11/20/15 0001    Ambulation/Gait   Ambulation/Gait Assistance 6: Modified independent (Device/Increase time)   Ambulation Distance (Feet) 300 Feet   Assistive device Rolling walker  mild dyspnea afterward, and reported fatigue   Gait Comments SpO2 96%, HR 88 prior to exercise; SpO2 97% and HR 98-110 after walking.  Focused on standing tall and decreasing weightbearing through arms.   Knee/Hip Exercises: Standing   Heel Raises Both;20 reps  at walker (and had AFO on)--tried to keep weight even   Knee Flexion Strengthening;Right;Left;2 sets;10 reps  2 lbs. right, 4 left--challenging   Forward Step Up Right;Left;10 reps;Hand Hold: 2;Step Height: 6"   Forward Step Up Limitations contact guard to min. assist with gait belt  SpO2 92%, HR 120 bpm after this; moderate dyspnea   Knee/Hip Exercises: Seated   Ball Squeeze with small football with 3 sec holds x 15 reps   Other Seated Knee/Hip Exercises seated short arc quads 4# on L and 2# on Rx 10 each x 2   Marching Both;10 reps;Strengthening;Weights;2 sets  2 lb. right,  4 lb. left   Sit to Sand --  8 times with right UE support only, and supervision                   Short Term Clinic Goals - 11/06/15 1729    CC Short Term Goal  #4   Title independent in home exercise program for bilateral LE strengthening   Time 4   Period Weeks   Status New             Long Term Clinic Goals - 11/20/15 1714    CC Long Term Goal  #3   Title lymphedema life impact scale reduced to 30% or less impairment   Status On-going   CC Long Term Goal  #5   Title right LE strength increased to grossly 3+/5 or more throughout   Status On-going   CC Long Term Goal  #6   Title left LE strength increased to grossly 4/5 throughout   Status On-going   CC Long Term Goal  #7   Title independent with more advanced LE  strengthening and mobility exercise program   Status On-going            Plan - 11/20/15 1602    Clinical Impression Statement Patient reported feeling like he got a good workout today.  He has also been swimming a couple of times this week so is really increasing his activity level.  He did report feeling tired from swimming, but was able to do the workout reported earlier in this note without significant difficulty, though he found it challenging.   Rehab Potential Good   PT Frequency 2x / week   PT Duration 8 weeks   PT Treatment/Interventions Gait training;Therapeutic exercise;ADLs/Self Care Home Management;Patient/family education   PT Next Visit Plan continue sit to stands with 1UE for support decreasing to no UE, LE strengthening, gait training    Consulted and Agree with Plan of Care Patient      Patient will benefit from skilled therapeutic intervention in order to improve the following deficits and impairments:  Increased edema, Pain, Decreased strength, Abnormal gait, Decreased activity tolerance, Decreased knowledge of use of DME, Decreased knowledge of precautions  Visit Diagnosis: Muscle weakness (generalized)  Other abnormalities of gait and mobility     Problem List Patient Active Problem List   Diagnosis Date Noted  . Leukocytosis   . Pancreatic mass   . Abdominal pain 07/07/2015  . Pancreatitis 07/07/2015  . DVT (deep venous thrombosis) ([place]) 04/19/2015  . Abdominal fluid collection   . Sepsis ([place]) 04/18/2015  . Intra-abdominal abscess ([place]) 04/18/2015  . Cognitive deficits 03/26/2015  . Thrombocytopenia ([place]) 03/25/2015  . Decubitus ulcer of buttock, stage 2 03/25/2015  . Acute encephalopathy 03/24/2015  . 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 Rash 03/05/2015  . Malnutrition of moderate degree ([place]) 02/05/2015  . Foot drop 02/03/2015  . 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 Backache 09/18/2009  . 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 KNEE  PAIN, LEFT, CHRONIC 08/07/2008  . CAROTID BRUIT, LEFT 08/07/2008  . Allergic rhinitis 08/03/2007  . 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 GERD 02/18/2007  . BPH associated with nocturia 02/18/2007    Avyukth Bontempo 11/20/2015, 5:17 PM  [place], [place], [address] Phone: [phone]   Fax:  [phone]  Name: Perry Nielsen. MRN: [id number] Date of Birth: March 24, 1941    Serafina Royals, PT 11/20/2015 5:17  PM

## 2015-11-21 ENCOUNTER — Encounter: Payer: Self-pay | Admitting: Physical Therapy

## 2015-11-21 ENCOUNTER — Ambulatory Visit: Payer: Medicare Other | Admitting: Physical Therapy

## 2015-11-21 DIAGNOSIS — R2689 Other abnormalities of gait and mobility: Secondary | ICD-10-CM | POA: Diagnosis not present

## 2015-11-21 DIAGNOSIS — M6281 Muscle weakness (generalized): Secondary | ICD-10-CM

## 2015-11-21 DIAGNOSIS — R609 Edema, unspecified: Secondary | ICD-10-CM | POA: Diagnosis not present

## 2015-11-21 DIAGNOSIS — M25571 Pain in right ankle and joints of right foot: Secondary | ICD-10-CM | POA: Diagnosis not present

## 2015-11-21 DIAGNOSIS — R600 Localized edema: Secondary | ICD-10-CM | POA: Diagnosis not present

## 2015-11-21 NOTE — Therapy (Signed)
[place], [place], [address] Phone: [phone]   Fax:  [phone]  Physical Therapy Treatment  Patient Details  Name: Perry Nielsen. MRN: [id number] Date of Birth: 17-Apr-1941 Referring Provider: Dr. Julieanne Manson  Encounter Date: 11/21/2015      PT End of Session - 11/21/15 1716    Visit Number 20   Number of Visits 25   Date for PT Re-Evaluation 12/07/15   Authorization Type April cert done [id number]   PT Start Time 1518   PT Stop Time 1600   PT Time Calculation (min) 42 min   Activity Tolerance Patient tolerated treatment well   Behavior During Therapy [facility] for tasks assessed/performed      Past Medical History  Diagnosis Date  . 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 ALLERGIC RHINITIS 08/03/2007  . GERD 02/18/2007  . KNEE PAIN, LEFT, CHRONIC 08/07/2008  . BACK PAIN 09/18/2009  . HAMMER TOE 08/03/2007  . CAROTID BRUIT, LEFT 08/07/2008  . 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 Foot drop     rt foot  . Rash, skin     torso  . Hepatitis 1977    no residual problems, type A  . Sleeping difficulty   . Skin cancer   . Lymphoma [facility])     Past Surgical History  Procedure Laterality Date  . Shoulder open rotator cuff repair  07/06/05    right  . Knee arthroscopy      left  . Tonsillectomy and adenoidectomy    . Transurethral resection of prostate    . Cataract extraction w/ intraocular lens  implant, bilateral Bilateral   . 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      Subjective Assessment - 11/21/15 1520    Subjective Pt states he has been swimming today. He states he swam 500 yards today which is more than he did the last two days. Pt feels like he has more  energy. He says he is still dragging his right leg some but it is moving better than the last two days.    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  Was diagnosed in early August.  Treatment has included radiation for 10 days and had 2 chemos; didn't tolerate the 6 that were planned.    Memory loss started about four weeks; trying to taper meds to ese if that helps.  Was healthy prior to this diagnosis.  Had blood clots in legs in October so is on Xarelto for six months.   Patient Stated Goals get help with drop foot   Currently in Pain? No/denies   Pain Score 0-No pain                         [facility] Adult PT Treatment/Exercise - 11/21/15 0001    Ambulation/Gait   Ambulation/Gait Assistance 6: Modified independent (Device/Increase time)   Ambulation Distance (Feet) 300 Feet   Assistive device Rolling walker  mild dyspnea afterward, and reported fatigue post amb O2 97%   Gait Comments SpO2 97%, HR 80 prior to exercise; SpO2 96% and HR 96  after walking.  Focused on standing tall and decreasing weightbearing through arms.   Knee/Hip Exercises: Standing   Heel Raises Both;20 reps  at walker (and had AFO on)--tried to keep weight even   Knee Flexion Strengthening;Right;Left;2 sets;10 reps  2 lbs. right, 4 left--challenging   Forward Step Up Right;Left;10 reps;Hand Hold: 2;Step Height: 6"  used walker for support   Forward Step Up Limitations contact guard with gait belt  SpO2 95%, HR 97 bpm after this; minimal dyspnea   Knee/Hip Exercises: Seated   Ball Squeeze with small football with 3 sec holds x 15 reps   Other Seated Knee/Hip Exercises seated short arc quads 3# on L and 1# on Rx 10 each x 2  pt unable to clear floor with 4 and 2lbs   Marching Both;10 reps;Strengthening;Weights;2 sets  2 lb. right, 4 lb. left                   Short Term Clinic  Goals - 11/06/15 1729    CC Short Term Goal  #4   Title independent in home exercise program for bilateral LE strengthening   Time 4   Period Weeks   Status New             Long Term Clinic Goals - 11/20/15 1714    CC Long Term Goal  #3   Title lymphedema life impact scale reduced to 30% or less impairment   Status On-going   CC Long Term Goal  #5   Title right LE strength increased to grossly 3+/5 or more throughout   Status On-going   CC Long Term Goal  #6   Title left LE strength increased to grossly 4/5 throughout   Status On-going   CC Long Term Goal  #7   Title independent with more advanced LE strengthening and mobility exercise program   Status On-going            Plan - 2015/12/03 1716    Clinical Impression Statement Pt reported he feels he has more energy and swam about 500 yards in the pool today before coming in for therapy. He continues to progress with his exercises and his O2 remains in the upper 90s, also his heart rate did not go over 100 bpm. He recovers quickly from the exercises and is motivated in therapy.    Rehab Potential Good   PT Frequency 2x / week   PT Duration 8 weeks   PT Treatment/Interventions Gait training;Therapeutic exercise;ADLs/Self Care Home Management;Patient/family education   PT Next Visit Plan continue sit to stands with 1UE for support decreasing to no UE, LE strengthening, gait training    Consulted and Agree with Plan of Care Patient      Patient will benefit from skilled therapeutic intervention in order to improve the following deficits and impairments:  Increased edema, Pain, Decreased strength, Abnormal gait, Decreased activity tolerance, Decreased knowledge of use of DME, Decreased knowledge of precautions  Visit Diagnosis: Muscle weakness (generalized)  Other abnormalities of gait and mobility       G-Codes - 12-03-2015 1719    Functional Assessment Tool Used clinical judgement   Functional Limitation Self care    Self Care Current Status [id number]) At least 20 percent but less than 40 percent impaired, limited or restricted   Self Care Goal Status [id number]) At least 1 percent but less than 20 percent impaired, limited or restricted      Problem List Patient Active Problem List  Diagnosis Date Noted  . Leukocytosis   . Pancreatic mass   . Abdominal pain 07/07/2015  . Pancreatitis 07/07/2015  . DVT (deep venous thrombosis) ([place]) 04/19/2015  . Abdominal fluid collection   . Sepsis ([place]) 04/18/2015  . Intra-abdominal abscess ([place]) 04/18/2015  . Cognitive deficits 03/26/2015  . Thrombocytopenia ([place]) 03/25/2015  . Decubitus ulcer of buttock, stage 2 03/25/2015  . Acute encephalopathy 03/24/2015  . 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 Rash 03/05/2015  . Malnutrition of moderate degree ([place]) 02/05/2015  . Foot drop 02/03/2015  . 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 Backache 09/18/2009  . 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 KNEE PAIN, LEFT, CHRONIC 08/07/2008  . CAROTID BRUIT, LEFT 08/07/2008  . Allergic rhinitis 08/03/2007  . 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 GERD 02/18/2007  . BPH associated with nocturia 02/18/2007    Alexia Freestone 11/21/2015, 5:20 PM  [place], [place], [address] Phone: [phone]   Fax:  [phone]  Name: Perry Nielsen. MRN: [id number] Date of Birth: 1940/12/29    Allyson Sabal, PT 11/21/2015 5:20 PM

## 2015-11-25 ENCOUNTER — Ambulatory Visit (INDEPENDENT_AMBULATORY_CARE_PROVIDER_SITE_OTHER): Payer: Medicare Other | Admitting: Psychology

## 2015-11-25 ENCOUNTER — Encounter: Payer: Self-pay | Admitting: Psychology

## 2015-11-25 DIAGNOSIS — R410 Disorientation, unspecified: Secondary | ICD-10-CM

## 2015-11-25 DIAGNOSIS — G9340 Encephalopathy, unspecified: Secondary | ICD-10-CM

## 2015-11-25 NOTE — Progress Notes (Signed)
NEUROPSYCHOLOGICAL INTERVIEW (CPT: [id number])  Name: Perry Nielsen. Date of Birth: 1941-02-17 Date of Interview: 11/25/2015  Reason for Referral:  Aadi Richardson. is a 75 y.o., right-handed, married male who is referred for neuropsychological evaluation by Dr. Wells Guiles Tat of [facility] due to concerns about cognitive changes. This patient is accompanied in the office by his spouse who supplements the history.  History of Presenting Problem: Mr. Hudzik was reportedly in his usual state of health when he began experiencing back and right leg pain in June or July 2016. 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 He was treated with chemotherapy and radiation. His wife reported acute mental status change in September 2016 during which time he apparently did not recognize his wife, was delusional and agitated. He was taken to the ED by his wife and admitted. His wife reported that he returned to baseline after one day. According to the discharge summary dated 03/27/2015, CT scan and MRI were negative. Labwork was reportedly otherwise unremarkable for renal failure or electrolyte abnormality. Thyroid was normal. The cause of his acute encephalopathy was unclear. Oncology evaluated the patient and felt it was not related to chemotherapy. Acute encephalopathy was thought to possibly be related to polypharmacy (in particular narcotic medications or trazodone); however, he had been taking these medications without any problem prior to the change in mental status.   Then, about two months ago, the patient's wife reported another acute change in mental status. He again was delusional, thinking that there were "two of [his wife]", and many different versions of his home. He also kept thinking he was in a facility and that there were many people there, when in fact he was at home with only his wife. She reported memory loss, confusion and  increased emotional displays. His oncologist noted a low clinical suspicion for CNS progression of the lymphoma. He was started on Remeron 10/04/2015. The patient's wife reported that he has slowly been improving over the past two months. She reported that for the most part, he is a lot better. She reported that he is no longer disoriented in his home and he is able to recognize her and others. He does however still misremember some prior events. For example, his wife noted that he still believes that she was not with him at a funeral a couple of weeks ago and that he was with someone else, but she in fact was with him.   At the present time, the patient denied having any concerns about his cognitive functioning.   At the present time, his wife denied forgetfulness for recent conversations, frequent misplacement of items, comprehension problems, or communication difficulties. She manages his medications and appointments. Until a couple of weeks ago, he was unable to read. He is back to reading now and is reading quite a bit in fact. He denies difficulties remembering what he has read. He denies problems with writing or spelling. He has not driven since July [id number]. He would like to return to driving once he is able to physically. He reported that he does the navigating for his wife when she drives him, and they both denied any problems with this. However, later in the appointment, his wife reported that he has trouble knowing where places are and how to get to them. (At times, it is difficult to know whether his wife is a completely accurate historian.)  Family history  is reportedly significant for dementia in his father and several of his father's siblings. His father showed signs in his 73s.  The patient reported that he completed treatment for cancer one week ago.  Prior psychiatric history was denied.  Current Functioning: Mr. Kulich lives with his wife in their own home. She is currently managing  all complex ADLs including transportation, medications, finances, appointments and cooking.   The patient has limited mobility due to his right foot drop. He has to use a walker to ambulate. He is currently swimming 2-3 days a week for rehabilitation. He reported that he has "minor" falls every now and then but he has not injured himself, struck his head or lost consciousness.  Mr. Olshefski reported that his mood is good overall. His wife agrees that his mood has been good, aside from irritability surrounding this appointment. (Patient reports that he did not want to come today but now that he is here, he realizes it's "not painful".) He reported some difficulty falling asleep at night but is able to sleep in. His appetite has improved and is normal now. His wife reports he is "more mellow" and "more attached to [her]". He stated, "I count on her." He denied any significant anxiety or worrying. He denied suicidal ideation or intention.  Social History: Born/Raised: [employer] Education: [employer]- bachelor's degree Occupational history: Retired Engineer, manufacturing Marital history: Married (x1), they have been together 70 years Children: 2 children and 5 grands Alcohol/Tobacco/Substances: minimal alcohol only. Never tobacco user. Denied history of substance abuse or dependence.  Medical History: Past Medical History  Diagnosis Date  . 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 ALLERGIC RHINITIS 08/03/2007  . GERD 02/18/2007  . KNEE PAIN, LEFT, CHRONIC 08/07/2008  . BACK PAIN 09/18/2009  . HAMMER TOE 08/03/2007  . CAROTID BRUIT, LEFT 08/07/2008  . 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 Foot drop     rt foot  . Rash, skin     torso  . Hepatitis 1977    no residual problems, type A  . Sleeping difficulty   . Skin cancer   . Lymphoma ([place])      Current Medications:  Outpatient Encounter Prescriptions as of 11/25/2015    Medication Sig  . famotidine (PEPCID) 20 MG tablet Take 20 mg by mouth daily.   . feeding supplement, ENSURE ENLIVE, (ENSURE ENLIVE) LIQD Take 237 mLs by mouth 2 (two) times daily between meals. (Patient taking differently: Take 237 mLs by mouth 2 (two) times daily between meals. Ensure Clear)  . ibuprofen (ADVIL,MOTRIN) 200 MG tablet Take 400 mg by mouth every 4 (four) hours as needed for fever, headache, mild pain, moderate pain or cramping.  Marland Kitchen LORazepam (ATIVAN) 0.5 MG tablet TAKE 1 TABLET BY MOUTH TWICE DAILY AS NEEDED FOR ANXIETY  . mirtazapine (REMERON) 7.5 MG tablet Take 2 tablets total 15 mg at bedtime.  Marland Kitchen oxymetazoline (AFRIN) 0.05 % nasal spray Place 1 spray into both nostrils 2 (two) times daily as needed for congestion.  Vladimir Faster Glycol-Propyl Glycol (SYSTANE OP) Apply 1 drop to eye daily as needed (dryness).  . polyethylene glycol (MIRALAX / GLYCOLAX) packet Take 17 g by mouth daily as needed.   No facility-administered encounter medications on file as of 11/25/2015.     Behavioral Observations:   Appearance: Neat, casually and appropriately dressed Gait: Ambulated with walker; slow gait Speech: Fluent; normal rate,  rhythm and volume Thought process: Linear, goal directed Affect: Full, euthymic Task persistence: Good Interpersonal: Pleasant, appropriate Orientation: Oriented to person and place. Oriented to day of the week. Disoriented to date, month and year.   TESTING: There was medical necessity to proceed with neuropsychological assessment as the results will be used to aid in differential diagnosis and clinical decision-making and to inform specific treatment recommendations. Per the patient's wife  and medical records reviewed, there has been a change in cognitive functioning and a reasonable suspicion of dementia and/or delirium.  Following the clinical interview, the patient completed 1.5 hours of neuropsychological testing.   Total face to face time spent in  clinical interview: 40 minutes (CPT: [phone]) Total face to face time spent administering neuropsychological tests: 90 minutes  PLAN: The patient will be scheduled for a follow-up session with this provider at which time his test performances and my impressions and treatment recommendations will be reviewed in detail.   Full neuropsychological evaluation report to follow.

## 2015-11-26 ENCOUNTER — Ambulatory Visit: Payer: Medicare Other | Admitting: Physical Therapy

## 2015-11-26 ENCOUNTER — Encounter: Payer: Self-pay | Admitting: Physical Therapy

## 2015-11-26 DIAGNOSIS — R2689 Other abnormalities of gait and mobility: Secondary | ICD-10-CM | POA: Diagnosis not present

## 2015-11-26 DIAGNOSIS — M25571 Pain in right ankle and joints of right foot: Secondary | ICD-10-CM | POA: Diagnosis not present

## 2015-11-26 DIAGNOSIS — M6281 Muscle weakness (generalized): Secondary | ICD-10-CM | POA: Diagnosis not present

## 2015-11-26 DIAGNOSIS — R609 Edema, unspecified: Secondary | ICD-10-CM | POA: Diagnosis not present

## 2015-11-26 DIAGNOSIS — R600 Localized edema: Secondary | ICD-10-CM | POA: Diagnosis not present

## 2015-11-26 NOTE — Therapy (Signed)
[name], [place], [address] Phone: [phone]   Fax:  [phone]  Physical Therapy Treatment  Patient Details  Name: Perry Nielsen. MRN: [id number] Date of Birth: [birth year] Referring Provider: Dr. Julieanne Nielsen  Encounter Date: 11/26/2015      PT End of Session - 11/26/15 1648    Visit Number 21   Number of Visits 25   Authorization Type April cert done [id number]   PT Start Time 1601   PT Stop Time 1644   PT Time Calculation (min) 43 min   Activity Tolerance Patient tolerated treatment well   Behavior During Therapy [facility] for tasks assessed/performed      Past Medical History  Diagnosis Date  . 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 ALLERGIC RHINITIS 08/03/2007  . GERD 02/18/2007  . KNEE PAIN, LEFT, CHRONIC 08/07/2008  . BACK PAIN 09/18/2009  . HAMMER TOE 08/03/2007  . CAROTID BRUIT, LEFT 08/07/2008  . BPH associated with nocturia 02/18/2007    Qualifier: Diagnosis of  By: Perry Nielsen CMA, Perry Nielsen    . Foot drop     rt foot  . Rash, skin     torso  . Hepatitis 1977    no residual problems, type A  . Sleeping difficulty   . Skin cancer   . Lymphoma [facility])     Past Surgical History  Procedure Laterality Date  . Shoulder open rotator cuff repair  07/06/05    right  . Knee arthroscopy      left  . Tonsillectomy and adenoidectomy    . Transurethral resection of prostate    . Cataract extraction w/ intraocular lens  implant, bilateral Bilateral   . 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      Subjective Assessment - 11/26/15 1604    Subjective Pt states he went swimming this morning. Pt alternates between swimming and walking in the water. Pt has not been aware of his right foot dragging. He states it could drag if he  gets lazy.    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  Was diagnosed in early August.  Treatment has included radiation for 10 days and had 2 chemos; didn't tolerate the 6 that were planned.    Memory loss started about four weeks; trying to taper meds to ese if that helps.  Was healthy prior to this diagnosis.  Had blood clots in legs in October so is on Xarelto for six months.   Patient Stated Goals get help with drop foot   Currently in Pain? No/denies  pt reports his feet hurt at night sometimes. wife reports his toe swell and he can only tolerate toe caps for 3 hrs   Pain Score 0-No pain                         [facility] Adult PT Treatment/Exercise - 11/26/15 0001    Ambulation/Gait   Ambulation/Gait Assistance 6: Modified independent (Device/Increase time)   Ambulation Distance (Feet) 468 Feet   Assistive device Rolling walker  mild dyspnea afterward, and reported fatigue    Gait Comments SpO2 97% and HR 114 after walking.  Focused on standing tall and look straight ahead   Knee/Hip Exercises: Standing  Heel Raises Both;20 reps  at walker, with AFO, 1 lb on R and 3 lb on L   Knee Flexion Strengthening;Right;Left;2 sets;15 reps  1 lbs. right, 3 left   Forward Step Up Right;Left;10 reps;Hand Hold: 2;Step Height: 6"  used walker for support   Forward Step Up Limitations contact guard with gait belt  SpO2 97%, HR 119 bpm after this; mod dyspnea, no rest breaks   Knee/Hip Exercises: Seated   Ball Squeeze with small football with 3 sec holds x 15 reps   Other Seated Knee/Hip Exercises seated short arc quads 3# on L and 1# on Rx 10 each x 2  pt unable to clear floor with 4 and 2lbs   Marching Both;10 reps;Strengthening;Weights;2 sets  1 lb. right, 3 lb. left   Sit to Sand 10 reps;with UE support;2 sets  with 1 hand support alternating between left and right                    Short Term  Clinic Goals - 11/06/15 1729    CC Short Term Goal  #4   Title independent in home exercise program for bilateral LE strengthening   Time 4   Period Weeks   Status New             Long Term Clinic Goals - 11/20/15 1714    CC Long Term Goal  #3   Title lymphedema life impact scale reduced to 30% or less impairment   Status On-going   CC Long Term Goal  #5   Title right LE strength increased to grossly 3+/5 or more throughout   Status On-going   CC Long Term Goal  #6   Title left LE strength increased to grossly 4/5 throughout   Status On-going   CC Long Term Goal  #7   Title independent with more advanced LE strengthening and mobility exercise program   Status On-going            Plan - 11/26/15 1648    Clinical Impression Statement Pt continuing to progress with exercises. His heart rate increases during exercises but he has a quick recovery. His O2 is staying in the upper 90s. He is tolerating increased reps of exercises. Today he was able to ambulate close to community distance with his front wheeled walker with frequent verbal cues to look straight ahead and for posture.    Rehab Potential Good   PT Frequency 2x / week   PT Duration 8 weeks   PT Treatment/Interventions Gait training;Therapeutic exercise;ADLs/Self Care Home Management;Patient/family education   PT Next Visit Plan continue sit to stands with 1UE for support decreasing to no UE, LE strengthening, gait training    Consulted and Agree with Plan of Care Patient      Patient will benefit from skilled therapeutic intervention in order to improve the following deficits and impairments:  Increased edema, Pain, Decreased strength, Abnormal gait, Decreased activity tolerance, Decreased knowledge of use of DME, Decreased knowledge of precautions  Visit Diagnosis: Muscle weakness (generalized)  Other abnormalities of gait and mobility     Problem List Patient Active Problem List   Diagnosis Date Noted   . Leukocytosis   . Pancreatic mass   . Abdominal pain 07/07/2015  . Pancreatitis 07/07/2015  . DVT (deep venous thrombosis) ([place]) 04/19/2015  . Abdominal fluid collection   . Sepsis ([place]) 04/18/2015  . Intra-abdominal abscess ([place]) 04/18/2015  . Cognitive deficits 03/26/2015  . Thrombocytopenia ([place]) 03/25/2015  .  Decubitus ulcer of buttock, stage 2 03/25/2015  . Acute encephalopathy 03/24/2015  . 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 Rash 03/05/2015  . Malnutrition of moderate degree ([place]) 02/05/2015  . Foot drop 02/03/2015  . 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 Backache 09/18/2009  . 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 KNEE PAIN, LEFT, CHRONIC 08/07/2008  . CAROTID BRUIT, LEFT 08/07/2008  . Allergic rhinitis 08/03/2007  . 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 GERD 02/18/2007  . BPH associated with nocturia 02/18/2007    Alexia Freestone 11/26/2015, 4:51 PM  [place], [place], [address] Phone: [phone]   Fax:  [phone]  Name: Perry Nielsen. MRN: [id number] Date of Birth: 05/26/1941    Allyson Sabal, PT 11/26/2015 4:51 PM

## 2015-11-28 ENCOUNTER — Ambulatory Visit: Payer: Medicare Other | Admitting: Physical Therapy

## 2015-11-28 ENCOUNTER — Encounter: Payer: Self-pay | Admitting: Physical Therapy

## 2015-11-28 DIAGNOSIS — R600 Localized edema: Secondary | ICD-10-CM | POA: Diagnosis not present

## 2015-11-28 DIAGNOSIS — R609 Edema, unspecified: Secondary | ICD-10-CM | POA: Diagnosis not present

## 2015-11-28 DIAGNOSIS — M25571 Pain in right ankle and joints of right foot: Secondary | ICD-10-CM | POA: Diagnosis not present

## 2015-11-28 DIAGNOSIS — R2689 Other abnormalities of gait and mobility: Secondary | ICD-10-CM | POA: Diagnosis not present

## 2015-11-28 DIAGNOSIS — M6281 Muscle weakness (generalized): Secondary | ICD-10-CM

## 2015-11-28 NOTE — Therapy (Signed)
[place], [place], [address] Phone: [phone]   Fax:  [phone]  Physical Therapy Treatment  Patient Details  Name: Perry Nielsen. MRN: [id number] Date of Birth: 05-20-1941 Referring Provider: Dr. Julieanne Manson  Encounter Date: 11/28/2015      PT End of Session - 11/28/15 1701    Visit Number 22   Number of Visits 25   Date for PT Re-Evaluation 12/07/15   Authorization Type April cert done 4/53   PT Start Time 1604   PT Stop Time 1650   PT Time Calculation (min) 46 min   Activity Tolerance Patient tolerated treatment well   Behavior During Therapy [facility] for tasks assessed/performed      Past Medical History  Diagnosis Date  . 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 ALLERGIC RHINITIS 08/03/2007  . GERD 02/18/2007  . KNEE PAIN, LEFT, CHRONIC 08/07/2008  . BACK PAIN 09/18/2009  . HAMMER TOE 08/03/2007  . CAROTID BRUIT, LEFT 08/07/2008  . 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 Foot drop     rt foot  . Rash, skin     torso  . Hepatitis 1977    no residual problems, type A  . Sleeping difficulty   . Skin cancer   . Lymphoma [facility])     Past Surgical History  Procedure Laterality Date  . Shoulder open rotator cuff repair  07/06/05    right  . Knee arthroscopy      left  . Tonsillectomy and adenoidectomy    . Transurethral resection of prostate    . Cataract extraction w/ intraocular lens  implant, bilateral Bilateral   . 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      Subjective Assessment - 11/28/15 1608    Subjective I did a lot in the pool yesterday. I did a lot of swimming and walking in the pool and I am sore today and tired. I think if I can keep at it I  am getting better.    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  Was diagnosed in early August.  Treatment has included radiation for 10 days and had 2 chemos; didn't tolerate the 6 that were planned.    Memory loss started about four weeks; trying to taper meds to ese if that helps.  Was healthy prior to this diagnosis.  Had blood clots in legs in October so is on Xarelto for six months.   Patient Stated Goals get help with drop foot   Currently in Pain? No/denies   Pain Score 0-No pain            [facility] PT Assessment - 11/28/15 0001    Observation/Other Assessments   Other Surveys  --  LLIS: 31%                     [facility] Adult PT Treatment/Exercise - 11/28/15 0001    Ambulation/Gait   Ambulation/Gait Assistance 6: Modified independent (Device/Increase time)  cues to stand up straight   Ambulation Distance (Feet) 250 Feet   Assistive device Rolling walker   Knee/Hip Exercises: Standing  Heel Raises Both;20 reps  at walker, with AFO, 2 lb on R and 3 lb on L   Knee Flexion Strengthening;Right;Left;2 sets;15 reps  2 lbs. right, 3 left   Knee/Hip Exercises: Seated   Ball Squeeze with small football with 3 sec holds x 15 reps   Other Seated Knee/Hip Exercises seated short arc quads 3# on L and 1# on Rx 10 each x 2  pt unable to clear floor with 4 and 2lbs   Marching Both;10 reps;Strengthening;Weights;2 sets  1 lb. right, 3 lb. left   Sit to Sand 10 reps;with UE support;2 sets  with 1 hand support alternating between left and right                    Short Term Clinic Goals - 11/06/15 1729    CC Short Term Goal  #4   Title independent in home exercise program for bilateral LE strengthening   Time 4   Period Weeks   Status New             Long Term Clinic Goals - 11/28/15 1641    CC Long Term Goal  #1   Title circumference of right  foot at 5 cm. proximal to metatarsal head reduced to 24.5 cm.   Baseline 26.2 cm. at eval compared to 23.5 on the left, 10/11/15- 21 cm   Status Achieved   CC Long Term Goal  #2   Title pain in right foot improved at least 50% in frequency and/or intensitiy   Baseline 11/01/15- Pt states he has had 50% improvement in his right foot overall - pt had difficulty giving this a percentage but stated it was 50%   Status Achieved   CC Long Term Goal  #3   Title lymphedema life impact scale reduced to 30% or less impairment   Baseline 57% at eval, 11/01/2015- 31% impairment   Status Achieved   CC Long Term Goal  #4   Title Pt. will be able to use an AFO to prevent foot drop and enable safe gait (or no AFO if not needed)   Status Achieved   CC Long Term Goal  #5   Title right LE strength increased to grossly 3+/5 or more throughout   Baseline 11/28/15- 3/5   Time 8   Period Weeks   Status On-going   CC Long Term Goal  #6   Title left LE strength increased to grossly 4/5 throughout   Baseline 11/28/15- 4/5   Status Achieved   CC Long Term Goal  #7   Title independent with more advanced LE strengthening and mobility exercise program   Time 8   Period Weeks   Status On-going            Plan - 11/28/15 1657    Clinical Impression Statement Pt has now met his strength goal for his left lower extremity. His right lower extremity strength is progressing and he can now move through entire ROM actively but at this point can not hold against minimal resistance. He continues to increase the amount of exercise in the pool and is much more active than he was at the beginning of therapy.    Rehab Potential Good   PT Frequency 2x / week   PT Duration 8 weeks   PT Treatment/Interventions Gait training;Therapeutic exercise;ADLs/Self Care Home Management;Patient/family education   PT Next Visit Plan continue sit to stands with 1UE for support decreasing to no UE, LE strengthening, gait  training, begin  consolidating and making a HEP   Consulted and Agree with Plan of Care Patient      Patient will benefit from skilled therapeutic intervention in order to improve the following deficits and impairments:  Increased edema, Pain, Decreased strength, Abnormal gait, Decreased activity tolerance, Decreased knowledge of use of DME, Decreased knowledge of precautions  Visit Diagnosis: Muscle weakness (generalized)  Other abnormalities of gait and mobility     Problem List Patient Active Problem List   Diagnosis Date Noted  . Leukocytosis   . Pancreatic mass   . Abdominal pain 07/07/2015  . Pancreatitis 07/07/2015  . DVT (deep venous thrombosis) ([place]) 04/19/2015  . Abdominal fluid collection   . Sepsis ([place]) 04/18/2015  . Intra-abdominal abscess ([place]) 04/18/2015  . Cognitive deficits 03/26/2015  . Thrombocytopenia ([place]) 03/25/2015  . Decubitus ulcer of buttock, stage 2 03/25/2015  . Acute encephalopathy 03/24/2015  . 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 Rash 03/05/2015  . Malnutrition of moderate degree ([place]) 02/05/2015  . Foot drop 02/03/2015  . 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 Backache 09/18/2009  . 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 KNEE PAIN, LEFT, CHRONIC 08/07/2008  . CAROTID BRUIT, LEFT 08/07/2008  . Allergic rhinitis 08/03/2007  . 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 GERD 02/18/2007  . BPH associated with nocturia 02/18/2007    Alexia Freestone 11/28/2015, 5:04 PM  [place], [place], [address] Phone: [phone]   Fax:  [phone]  Name: Perry Nielsen. MRN: [id number] Date of Birth: [birth year]    Allyson Sabal, PT 11/28/2015 5:04 PM

## 2015-12-03 ENCOUNTER — Ambulatory Visit (INDEPENDENT_AMBULATORY_CARE_PROVIDER_SITE_OTHER): Payer: Medicare Other | Admitting: Psychology

## 2015-12-03 DIAGNOSIS — R410 Disorientation, unspecified: Secondary | ICD-10-CM

## 2015-12-03 DIAGNOSIS — G9340 Encephalopathy, unspecified: Secondary | ICD-10-CM | POA: Diagnosis not present

## 2015-12-04 ENCOUNTER — Ambulatory Visit: Payer: Medicare Other | Admitting: Physical Therapy

## 2015-12-04 ENCOUNTER — Other Ambulatory Visit: Payer: Self-pay | Admitting: Family Medicine

## 2015-12-04 ENCOUNTER — Encounter: Payer: Self-pay | Admitting: Psychology

## 2015-12-04 DIAGNOSIS — M6281 Muscle weakness (generalized): Secondary | ICD-10-CM | POA: Diagnosis not present

## 2015-12-04 DIAGNOSIS — R609 Edema, unspecified: Secondary | ICD-10-CM | POA: Diagnosis not present

## 2015-12-04 DIAGNOSIS — M25571 Pain in right ankle and joints of right foot: Secondary | ICD-10-CM | POA: Diagnosis not present

## 2015-12-04 DIAGNOSIS — R2689 Other abnormalities of gait and mobility: Secondary | ICD-10-CM

## 2015-12-04 DIAGNOSIS — R600 Localized edema: Secondary | ICD-10-CM | POA: Diagnosis not present

## 2015-12-04 NOTE — Progress Notes (Signed)
NEUROPSYCHOLOGICAL EVALUATION   Name:    Perry Nielsen.  Date of Birth:   1941/06/22 Date of Evaluation:  11/25/2015   Date of Feedback:  12/03/2015     Background Information:  Reason for Referral:  Perry Nielsen. is a 75 y.o. male referred by Dr. Wells Guiles Tat to assess his current level of cognitive functioning and assist in differential diagnosis. The current evaluation consisted of a review of available medical records, an interview with the patient and his wife, and the completion of a neuropsychological testing battery. Informed consent was obtained.  History of Presenting Problem: Perry Nielsen was reportedly in his usual state of health when he began experiencing back and right leg pain in June or July 2016. 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 He was treated with chemotherapy and radiation. His wife reported acute mental status change in September 2016 during which time he apparently did not recognize his wife, was delusional and agitated. He was taken to the ED by his wife and admitted. His wife reported that he returned to baseline after one day. According to the discharge summary dated 03/27/2015, CT scan and MRI were negative. Labwork was reportedly otherwise unremarkable for renal failure or electrolyte abnormality. Thyroid was normal. The cause of his acute encephalopathy was unclear. Oncology evaluated the patient and felt it was not related to chemotherapy. Acute encephalopathy was thought to possibly be related to polypharmacy (in particular narcotic medications or trazodone); however, he had been taking these medications without any problem prior to the change in mental status.   Then, about two months ago, the patient's wife reported another acute change in mental status. He again was delusional, thinking that there were "two of [his wife]", and many different versions of his home. He also kept  thinking he was in a facility and that there were many people there, when in fact he was at home with only his wife. She reported memory loss, confusion and increased emotional displays. His oncologist noted a low clinical suspicion for CNS progression of the lymphoma. He was started on Remeron 10/04/2015. The patient's wife reported that he has slowly been improving over the past two months. She reported that for the most part, he is a lot better. She reported that he is no longer disoriented in his home and he is able to recognize her and others. He does however still misremember some prior events. For example, his wife noted that he still believes that she was not with him at a funeral a couple of weeks ago and that he was with someone else, but she in fact was with him.   At the present time, the patient denied having any concerns about his cognitive functioning.   At the present time, his wife denied forgetfulness for recent conversations, frequent misplacement of items, comprehension problems, or communication difficulties. She manages his medications and appointments. Until a couple of weeks ago, he was unable to read. He is back to reading now and is reading quite a bit in fact. He denies difficulties remembering what he has read. He denies problems with writing or spelling. He has not driven since July [id number]. He would like to return to driving once he is able to physically. He reported that he does the navigating for his wife when she drives him, and they both denied any problems with this. However, later in the appointment, his  wife reported that he has trouble knowing where places are and how to get to them. (At times, it is difficult to know whether his wife is a completely accurate historian.)  Family history is reportedly significant for dementia in his father and several of his father's siblings. His father showed signs in his 84s.  The patient reported that he completed treatment for cancer one  week ago.  Prior psychiatric history was denied.  Current Functioning: Perry Nielsen lives with his wife in their own home. She is currently managing all complex ADLs including transportation, medications, finances, appointments and cooking.   The patient has limited mobility due to his right foot drop. He has to use a walker to ambulate. He is currently swimming 2-3 days a week for rehabilitation. He reported that he has "minor" falls every now and then but he has not injured himself, struck his head or lost consciousness.  [name] reported that his mood is good overall. His wife agrees that his mood has been good, aside from irritability surrounding this appointment. (Patient reports that he did not want to come today but now that he is here, he realizes it's "not painful".) He reported some difficulty falling asleep at night but is able to sleep in. His appetite has improved and is normal now. His wife reports he is "more mellow" and "more attached to [her]". He stated, "I count on her." He denied any significant anxiety or worrying. He denied suicidal ideation or intention.  Social History: Born/Raised: [employer] Education: [employer]- bachelor's degree Occupational history: Retired Engineer, manufacturing Marital history: Married (x1), they have been together 40 years Children: 2 children and 5 grands Alcohol/Tobacco/Substances: minimal alcohol only. Never tobacco user. Denied history of substance abuse or dependence.   Medical History:  Past Medical History  Diagnosis Date  . 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 ALLERGIC RHINITIS 08/03/2007  . GERD 02/18/2007  . KNEE PAIN, LEFT, CHRONIC 08/07/2008  . BACK PAIN 09/18/2009  . HAMMER TOE 08/03/2007  . CAROTID BRUIT, LEFT 08/07/2008  . 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 Foot drop     rt foot  . Rash, skin     torso  . Hepatitis 1977    no residual  problems, type A  . Sleeping difficulty   . Skin cancer   . Lymphoma ([place])     Current medications:  Outpatient Encounter Prescriptions as of 12/03/2015  Medication Sig  . famotidine (PEPCID) 20 MG tablet Take 20 mg by mouth daily.   . feeding supplement, ENSURE ENLIVE, (ENSURE ENLIVE) LIQD Take 237 mLs by mouth 2 (two) times daily between meals. (Patient taking differently: Take 237 mLs by mouth 2 (two) times daily between meals. Ensure Clear)  . ibuprofen (ADVIL,MOTRIN) 200 MG tablet Take 400 mg by mouth every 4 (four) hours as needed for fever, headache, mild pain, moderate pain or cramping.  Marland Kitchen LORazepam (ATIVAN) 0.5 MG tablet TAKE 1 TABLET BY MOUTH TWICE DAILY AS NEEDED FOR ANXIETY  . mirtazapine (REMERON) 7.5 MG tablet Take 2 tablets total 15 mg at bedtime.  Marland Kitchen oxymetazoline (AFRIN) 0.05 % nasal spray Place 1 spray into both nostrils 2 (two) times daily as needed for congestion.  Vladimir Faster Glycol-Propyl Glycol (SYSTANE OP) Apply 1 drop to eye daily as needed (dryness).  . polyethylene glycol (MIRALAX / GLYCOLAX) packet Take 17 g by mouth daily as needed.  No facility-administered encounter medications on file as of 12/03/2015.     Current Examination:  Behavioral Observations:  Appearance: Neat, casually and appropriately dressed Gait: Ambulated with walker; slow gait Speech: Fluent; normal rate, rhythm and volume Thought process: Linear, goal directed Affect: Full, euthymic Task persistence: Good Interpersonal: Pleasant, appropriate Orientation: Oriented to person and place. Oriented to day of the week. Disoriented to date, month and year.   Tests Administered: . Test of Premorbid Functioning (TOPF) . Repeatable Battery for the Assessment of Neuropsychological Status (RBANS) Form A: Figure Copy and Recall subtests, Story Memory and Recall subtests . Wechsler Adult Intelligence Scale-Fourth Edition (WAIS-IV): Similarities, Matrix Reasoning, Coding and Digit Span  subtests . Neuropsychological Assessment Battery (NAB) Language Module, Form 1: Naming Subtest . Controlled Oral Word Association Test (COWAT) . Trail Making Test A and B . [employer] Verbal Learning Test - 2nd Edition (CVLT-2) Short Form . Clock drawing test . Geriatric Depression Scale (GDS) 15 Item . Generalized Anxiety Disorder - 7 item screener (GAD-7)  Test Results: Note: Standardized scores are presented only for use by appropriately trained professionals and to allow for any future test-retest comparison. These scores should not be interpreted without consideration of all the information that is contained in the rest of the report. The most recent standardization samples from the test publisher or other sources were used whenever possible to derive standard scores; scores were corrected for age, gender, ethnicity and education when available.   Test Scores:  Test Name Standardized Score Descriptor  TOPF SS=104 Average  WAIS-IV Subtests    Similarities ss=9 Average  Matrix Reasoning ss=6 Low Average  Coding ss=5 Borderline Impaired  Digit Span Forward ss=9 Average  Digit Span Backward ss=10 Average  RBANS Subtests    Figure Copy Z=-0.44 Average  Figure Recall Z=-1.07 Low average  Story Memory Z=-0.11 Average  Story Recall Z=-1.82 Borderline impaired  CVLT-II Scores    Trial 1 Z=-1.0 Low Average  Trial 4 Z=-2.0 Impaired  Trials 1-4 total T=27 Severely Impaired  SD Free Recall Z=-1.0 Low Average  LD Free Recall Z=0 Average  LD Cued Recall Z=-0.5 Average  Recognition Discriminability (6/9 hits, 0 false positives) Z=0 Average  NAB Naming (4/4 with phonemic cueing) T=30 Impaired  COWAT-FAS T=50 Average  COWAT-Animals T=40 Low Average  Trail Making Test A 0 errors T=7 Severely impaired  Trail Making Test B 1 error T=21 Severely Impaired  Clock Drawing  Correct construction except wrote "0" instead of "12" Correct time placement  GDS-15  WNL (2/15)  GAD-7  WNL (2/21)    Description of Test Results:  Premorbid verbal intellectual abilities were estimated to have been within at least the average range based on a test of word reading. Psychomotor processing speed was impaired. Auditory attention and working memory were average. Visual-spatial construction was average. Language abilities were variable. Specifically, confrontation naming was impaired, while semantic verbal fluency was low average. On the confrontation naming task, he benefitted significantly with phonemic cueing. With regard to verbal memory, encoding and acquisition of non-contextual information (i.e., word list) was severely impaired across four learning trials. However, after a brief distracter task, free recall was low average, and after a 10 minute delay, both free and cued recall were average. Performance on a yes/no recognition task was average overall. On another verbal memory test, encoding and acquisition of contextual auditory information (i.e., short story) was average across two learning trials. After a delay, free recall was borderline impaired. With regard to non-verbal memory, delayed free  recall of visual information was low average. Executive functioning was somewhat variable. Mental flexibility and set-shifting were severely impaired on Trails B. Non-verbal abstract reasoning was low average. Verbal abstract reasoning was average. Verbal fluency with phonemic search restrictions was average.  Performance on a clock drawing task was generally intact. On self-report questionnaires, the patient's responses were not indicative of clinically significant depression or anxiety.  Clinical Impression: Major neurocognitive disorder unspecified (suspect resolving delirium/encephalopathy).  Results of the current cognitive evaluation reveal impaired processing speed, confrontation naming and mental flexibility, as well as variable memory encoding abilities. Meanwhile, memory consolidation, attention and  visual-spatial skills appear intact. This cognitive profile, along with the reported acute onset of mental status change, is NOT consistent with an underlying Alzheimer's disease. Based on his history, it appears he experienced delirium and/or encephalopathy. Etiology remains unknown, but based on his neuropsychological testing results and reports from both the patient and his wife, it appears that his cognitive functioning has improved significantly since March. As such, I am much less concerned about an underlying neurodegenerative process, and I am hopeful that cognition will continue to improve.  Recommendations/Plan:  1. Continued monitoring of cognitive status is recommended. If the patient experiences another acute onset of mental status change, disorientation, or confusion, he should be taken to the hospital or his physician for evaluation.  2. I can certainly see the patient back for neuropsychological evaluation in the future if there is a change in cognitive functioning reported or observed. 3. The patient should participate in activities which provide mental stimulation, cardiovascular exercise and social interaction, in order to maintain QOL and maximize cognitive function.  4. He does require continued assistance with complex ADLs at the present time (driving, appointments, finances).    Feedback to Patient: Findlay Munsey. and his wife returned for a feedback appointment on 12/03/2015 to review the results of his neuropsychological evaluation with this provider. 30 minutes face-to-face time was spent reviewing his test results, my impressions and my recommendations as detailed above.    Total time spent on this patient's case: 90791x1 unit; 96118x6 units including medical record review, administration and scoring of neuropsychological tests, interpretation of test results, preparation of this report, and review of results to the patient.     Thank you for your referral of Shintaro Dietert.. Please feel free to contact me if you have any questions or concerns regarding this report.

## 2015-12-04 NOTE — Therapy (Signed)
[place], [place], [address] Phone: [phone]   Fax:  [phone]  Physical Therapy Treatment  Patient Details  Name: Perry Nielsen. MRN: [id number] Date of Birth: [birth year] Referring Provider: Dr. Julieanne Manson  Encounter Date: 12/04/2015      PT End of Session - 12/04/15 1725    Visit Number 23   Number of Visits 25   Date for PT Re-Evaluation 12/07/15   PT Start Time 1436   PT Stop Time 1522   PT Time Calculation (min) 46 min   Activity Tolerance Patient tolerated treatment well   Behavior During Therapy [facility] for tasks assessed/performed      Past Medical History  Diagnosis Date  . 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 ALLERGIC RHINITIS 08/03/2007  . GERD 02/18/2007  . KNEE PAIN, LEFT, CHRONIC 08/07/2008  . BACK PAIN 09/18/2009  . HAMMER TOE 08/03/2007  . CAROTID BRUIT, LEFT 08/07/2008  . 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 Foot drop     rt foot  . Rash, skin     torso  . Hepatitis 1977    no residual problems, type A  . Sleeping difficulty   . Skin cancer   . Lymphoma [facility])     Past Surgical History  Procedure Laterality Date  . Shoulder open rotator cuff repair  07/06/05    right  . Knee arthroscopy      left  . Tonsillectomy and adenoidectomy    . Transurethral resection of prostate    . Cataract extraction w/ intraocular lens  implant, bilateral Bilateral   . 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      Subjective Assessment - 12/04/15 1438    Subjective "I've been swimming."  "It's not as good as it was early on.  My foot's sore some"--and he thinks that's from walking in the pool.  Still doesn't sleep well at night because feet are  uncomfortable, especially the right.  Feels the swelling is under pretty good control.     Currently in Pain? Yes   Pain Location Foot   Pain Orientation Right   Pain Descriptors / Indicators Sore   Aggravating Factors  walking in the pool   Pain Relieving Factors time--gets better in about an hour                         [facility] Adult PT Treatment/Exercise - 12/04/15 0001    Knee/Hip Exercises: Aerobic   Stationary Bike 2 minutes; had difficulty with bike resistance spontaneously changing from light to heavy   Knee/Hip Exercises: Standing   Heel Raises Both  30 reps; 3# left, 2# right   Knee Flexion Strengthening;Right;Left;2 sets;15 reps  3# left, 2# right   Forward Step Up Right;Left;10 reps;Hand Hold: 2;Step Height: 6"   Forward Step Up Limitations min assist with belt   Knee/Hip Exercises: Seated   Ball Squeeze with small football with 3 sec holds x 15 reps   Other Seated Knee/Hip Exercises seated short arc quads 3# on L and 2# on Rx 10 each x 2   Marching Right;Left;2 sets;Weights;15 reps  #3 left, 2# right   Sit to Sand 10 reps;with  UE support;2 sets  with 1 hand support alternating between left and right                    Short Term Clinic Goals - 11/06/15 1729    CC Short Term Goal  #4   Title independent in home exercise program for bilateral LE strengthening   Time 4   Period Weeks   Status New             Long Term Clinic Goals - 11/28/15 1641    CC Long Term Goal  #1   Title circumference of right foot at 5 cm. proximal to metatarsal head reduced to 24.5 cm.   Baseline 26.2 cm. at eval compared to 23.5 on the left, 10/11/15- 21 cm   Status Achieved   CC Long Term Goal  #2   Title pain in right foot improved at least 50% in frequency and/or intensitiy   Baseline 11/01/15- Pt states he has had 50% improvement in his right foot overall - pt had difficulty giving this a percentage but stated it was 50%   Status Achieved   CC Long  Term Goal  #3   Title lymphedema life impact scale reduced to 30% or less impairment   Baseline 57% at eval, 11/01/2015- 31% impairment   Status Achieved   CC Long Term Goal  #4   Title Pt. will be able to use an AFO to prevent foot drop and enable safe gait (or no AFO if not needed)   Status Achieved   CC Long Term Goal  #5   Title right LE strength increased to grossly 3+/5 or more throughout   Baseline 11/28/15- 3/5   Time 8   Period Weeks   Status On-going   CC Long Term Goal  #6   Title left LE strength increased to grossly 4/5 throughout   Baseline 11/28/15- 4/5   Status Achieved   CC Long Term Goal  #7   Title independent with more advanced LE strengthening and mobility exercise program   Time 8   Period Weeks   Status On-going            Plan - 12/04/15 1725    Clinical Impression Statement Patient doing well and being challenged by exercise program.  He has been swimming but may benefit from more formalized HEP for strengthening and/or transition to a gym program.   Rehab Potential Good   PT Frequency 2x / week   PT Duration 8 weeks   PT Treatment/Interventions Gait training;Therapeutic exercise;ADLs/Self Care Home Management;Patient/family education   PT Next Visit Plan Reassess and renew; continue sit to stands with 1UE for support decreasing to no UE, LE strengthening, gait training, begin consolidating and making a HEP; consider transition to a gym program over the next few weeks.   Consulted and Agree with Plan of Care Patient      Patient will benefit from skilled therapeutic intervention in order to improve the following deficits and impairments:  Increased edema, Pain, Decreased strength, Abnormal gait, Decreased activity tolerance, Decreased knowledge of use of DME, Decreased knowledge of precautions  Visit Diagnosis: Muscle weakness (generalized)  Other abnormalities of gait and mobility     Problem List Patient Active Problem List   Diagnosis Date  Noted  . Leukocytosis   . Pancreatic mass   . Abdominal pain 07/07/2015  . Pancreatitis 07/07/2015  . DVT (deep venous thrombosis) ([place]) 04/19/2015  . Abdominal fluid collection   .  Sepsis ([place]) 04/18/2015  . Intra-abdominal abscess ([place]) 04/18/2015  . Cognitive deficits 03/26/2015  . Thrombocytopenia ([place]) 03/25/2015  . Decubitus ulcer of buttock, stage 2 03/25/2015  . Acute encephalopathy 03/24/2015  . 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 Rash 03/05/2015  . Malnutrition of moderate degree ([place]) 02/05/2015  . Foot drop 02/03/2015  . 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 Backache 09/18/2009  . 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 KNEE PAIN, LEFT, CHRONIC 08/07/2008  . CAROTID BRUIT, LEFT 08/07/2008  . Allergic rhinitis 08/03/2007  . 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 GERD 02/18/2007  . BPH associated with nocturia 02/18/2007    SALISBURY,DONNA 12/04/2015, 5:28 PM  [place], [place], [address] Phone: [phone]   Fax:  [phone]  Name: Perry Nielsen. MRN: [id number] Date of Birth: Oct 22, 1940    Serafina Royals, PT 12/04/2015 5:28 PM

## 2015-12-06 ENCOUNTER — Ambulatory Visit: Payer: Medicare Other | Attending: Oncology | Admitting: Physical Therapy

## 2015-12-06 DIAGNOSIS — R269 Unspecified abnormalities of gait and mobility: Secondary | ICD-10-CM | POA: Diagnosis not present

## 2015-12-06 DIAGNOSIS — R609 Edema, unspecified: Secondary | ICD-10-CM | POA: Insufficient documentation

## 2015-12-06 DIAGNOSIS — M25571 Pain in right ankle and joints of right foot: Secondary | ICD-10-CM | POA: Insufficient documentation

## 2015-12-06 DIAGNOSIS — R2689 Other abnormalities of gait and mobility: Secondary | ICD-10-CM | POA: Diagnosis not present

## 2015-12-06 DIAGNOSIS — M6281 Muscle weakness (generalized): Secondary | ICD-10-CM | POA: Diagnosis not present

## 2015-12-06 DIAGNOSIS — R600 Localized edema: Secondary | ICD-10-CM | POA: Diagnosis not present

## 2015-12-06 NOTE — Therapy (Signed)
[place], [place], [address] Phone: [phone]   Fax:  [phone]  Physical Therapy Treatment  Patient Details  Name: Perry Nielsen. MRN: [id number] Date of Birth: 09-01-1940 Referring Provider: Dr. Julieanne Manson  Encounter Date: 12/06/2015      PT End of Session - 12/06/15 1311    Visit Number 24   Number of Visits 32   Date for PT Re-Evaluation 01/10/16   PT Start Time 0847   PT Stop Time 0932   PT Time Calculation (min) 45 min   Activity Tolerance Patient tolerated treatment well   Behavior During Therapy [facility] for tasks assessed/performed      Past Medical History  Diagnosis Date  . 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 ALLERGIC RHINITIS 08/03/2007  . GERD 02/18/2007  . KNEE PAIN, LEFT, CHRONIC 08/07/2008  . BACK PAIN 09/18/2009  . HAMMER TOE 08/03/2007  . CAROTID BRUIT, LEFT 08/07/2008  . 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 Foot drop     rt foot  . Rash, skin     torso  . Hepatitis 1977    no residual problems, type A  . Sleeping difficulty   . Skin cancer   . Lymphoma [facility])     Past Surgical History  Procedure Laterality Date  . Shoulder open rotator cuff repair  07/06/05    right  . Knee arthroscopy      left  . Tonsillectomy and adenoidectomy    . Transurethral resection of prostate    . Cataract extraction w/ intraocular lens  implant, bilateral Bilateral   . 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      Subjective Assessment - 12/06/15 0848    Subjective "Tired and sleepy this morning. This is early for [place]."     Currently in Pain? Yes   Pain Score 4    Pain Location Foot   Pain Orientation Right;Left   Pain Descriptors / Indicators  Sore   Aggravating Factors  sock that they tried last night   Pain Relieving Factors changing footwear            [facility] PT Assessment - 12/06/15 0001    6 Minute Walk- Baseline   6 Minute Walk- Baseline yes   HR (bpm) 82   02 Sat (%RA) 99 %   6 Minute walk- Post Test   6 Minute Walk Post Test yes   HR (bpm) 108   02 Sat (%RA) 95 %   Perceived Rate of Exertion (Borg) 10-   6 minute walk test results    Aerobic Endurance Distance Walked 500   Endurance additional comments used rolling walker                     [facility] Adult PT Treatment/Exercise - 12/06/15 0001    Elbow Exercises   Elbow Extension Strengthening;Right;Left;20 reps;Standing;Bar weights/barbell  kickbacks in standing with one UE support-difficult position   Bar Weights/Barbell (Elbow Extension) 4 lbs  could do more on right   Lumbar Exercises: Seated   Other Seated Lumbar Exercises arms across chest, slight back lean for abdominals x 10   Knee/Hip Exercises: Standing   Knee Flexion Strengthening;Right;Left;2 sets;15 reps  3# left, 2# right   Knee/Hip Exercises: Seated   Ball Squeeze with small football with 3 sec holds x 15 reps   Other Seated Knee/Hip Exercises seated short arc quads 3# on L and 2# on Rx 10 each x 3  could increase weight on left   Shoulder Exercises: Seated   Other Seated Exercises overhead press 3# in each hand x 10 x 3  could increase weight on right arm   Shoulder Exercises: Standing   Other Standing Exercises with contact guard for balance, 3-way arm raises with 3# x 10, then 2# x 10 each way                   Short Term Clinic Goals - 11/06/15 1729    CC Short Term Goal  #4   Title independent in home exercise program for bilateral LE strengthening   Time 4   Period Weeks   Status New             Long Term Clinic Goals - 12/06/15 1316    CC Long Term Goal  #1   Title circumference of right foot at 5 cm. proximal to metatarsal head reduced to 24.5  cm.   Status Achieved   CC Long Term Goal  #2   Title pain in right foot improved at least 50% in frequency and/or intensitiy   Status Achieved   CC Long Term Goal  #3   Title lymphedema life impact scale reduced to 30% or less impairment   Status Achieved   CC Long Term Goal  #4   Title Pt. will be able to use an AFO to prevent foot drop and enable safe gait (or no AFO if not needed)   Status Achieved   CC Long Term Goal  #5   Title right LE strength increased to grossly 3+/5 or more throughout   Status On-going   CC Long Term Goal  #6   Title left LE strength increased to grossly 4/5 throughout   Status Achieved   CC Long Term Goal  #7   Title independent with more advanced LE strengthening and mobility exercise program   Status On-going            Plan - 12/06/15 1312    Clinical Impression Statement Patient walked 500 feet using his rolling walker in the 6-minute walk test.  He relies on the walker even for taking one step in a transitional movement.  We added some UE strengthening to his program today, and he found that challenging, especially for the left arm.     Rehab Potential Good   PT Frequency 2x / week   PT Duration 4 weeks   PT Treatment/Interventions Gait training;Therapeutic exercise;ADLs/Self Care Home Management;Patient/family education   PT Next Visit Plan Continue LE and UE strengthening; work on decreasing need for using walker to take just a step or two; continue sit to stand with one arm support; refine and formalize HEP and work on transitioning to a gym program.  Continue work toward remaining two unmet goals.   Consulted and Agree with Plan of Care Patient      Patient will benefit from skilled therapeutic intervention in order to improve the following deficits and impairments:  Increased edema, Pain, Decreased strength, Abnormal gait, Decreased activity tolerance, Decreased knowledge of use of DME, Decreased knowledge of precautions  Visit  Diagnosis: Muscle weakness (generalized) - Plan: PT plan of care cert/re-cert  Other abnormalities of gait  and mobility - Plan: PT plan of care cert/re-cert     Problem List Patient Active Problem List   Diagnosis Date Noted  . Leukocytosis   . Pancreatic mass   . Abdominal pain 07/07/2015  . Pancreatitis 07/07/2015  . DVT (deep venous thrombosis) ([place]) 04/19/2015  . Abdominal fluid collection   . Sepsis ([place]) 04/18/2015  . Intra-abdominal abscess ([place]) 04/18/2015  . Cognitive deficits 03/26/2015  . Thrombocytopenia ([place]) 03/25/2015  . Decubitus ulcer of buttock, stage 2 03/25/2015  . Acute encephalopathy 03/24/2015  . 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 Rash 03/05/2015  . Malnutrition of moderate degree ([place]) 02/05/2015  . Foot drop 02/03/2015  . 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 Backache 09/18/2009  . 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 KNEE PAIN, LEFT, CHRONIC 08/07/2008  . CAROTID BRUIT, LEFT 08/07/2008  . Allergic rhinitis 08/03/2007  . 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 GERD 02/18/2007  . BPH associated with nocturia 02/18/2007    SALISBURY,DONNA 12/06/2015, 1:20 PM  [place], [place], [address] Phone: [phone]   Fax:  [phone]  Name: Given Perry Nielsen. MRN: [id number] Date of Birth: [birth year]    Serafina Royals, PT 12/06/2015 1:20 PM

## 2015-12-09 ENCOUNTER — Encounter: Payer: Self-pay | Admitting: Physical Therapy

## 2015-12-09 ENCOUNTER — Ambulatory Visit: Payer: Medicare Other | Admitting: Physical Therapy

## 2015-12-09 DIAGNOSIS — R600 Localized edema: Secondary | ICD-10-CM | POA: Diagnosis not present

## 2015-12-09 DIAGNOSIS — R609 Edema, unspecified: Secondary | ICD-10-CM | POA: Diagnosis not present

## 2015-12-09 DIAGNOSIS — R2689 Other abnormalities of gait and mobility: Secondary | ICD-10-CM | POA: Diagnosis not present

## 2015-12-09 DIAGNOSIS — M25571 Pain in right ankle and joints of right foot: Secondary | ICD-10-CM | POA: Diagnosis not present

## 2015-12-09 DIAGNOSIS — M6281 Muscle weakness (generalized): Secondary | ICD-10-CM

## 2015-12-09 DIAGNOSIS — R269 Unspecified abnormalities of gait and mobility: Secondary | ICD-10-CM | POA: Diagnosis not present

## 2015-12-09 NOTE — Therapy (Signed)
[place], [place], [address] Phone: [phone]   Fax:  [phone]  Physical Therapy Treatment  Patient Details  Name: Perry Nielsen. MRN: [id number] Date of Birth: [birth year] Referring Provider: Dr. Julieanne Manson  Encounter Date: 12/09/2015      PT End of Session - 12/09/15 1518    Visit Number 25   Number of Visits 32   Date for PT Re-Evaluation 01/10/16   Authorization Type April cert done [id number]   PT Start Time 1431   PT Stop Time 1515   PT Time Calculation (min) 44 min   Activity Tolerance Patient tolerated treatment well   Behavior During Therapy [facility] for tasks assessed/performed      Past Medical History  Diagnosis Date  . 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 ALLERGIC RHINITIS 08/03/2007  . GERD 02/18/2007  . KNEE PAIN, LEFT, CHRONIC 08/07/2008  . BACK PAIN 09/18/2009  . HAMMER TOE 08/03/2007  . CAROTID BRUIT, LEFT 08/07/2008  . 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 Foot drop     rt foot  . Rash, skin     torso  . Hepatitis 1977    no residual problems, type A  . Sleeping difficulty   . Skin cancer   . Lymphoma [facility])     Past Surgical History  Procedure Laterality Date  . Shoulder open rotator cuff repair  07/06/05    right  . Knee arthroscopy      left  . Tonsillectomy and adenoidectomy    . Transurethral resection of prostate    . Cataract extraction w/ intraocular lens  implant, bilateral Bilateral   . 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      Subjective Assessment - 12/09/15 1433    Subjective I am having trouble going to sleep at night. I am having trouble with my legs. My arms were sore after last session.    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  Was diagnosed in early August.  Treatment has included radiation for 10 days and had 2 chemos; didn't tolerate the 6 that were planned.    Memory loss started about four weeks; trying to taper meds to ese if that helps.  Was healthy prior to this diagnosis.  Had blood clots in legs in October so is on Xarelto for six months.   Patient Stated Goals get help with drop foot   Currently in Pain? No/denies   Pain Score 0-No pain                         [facility] Adult PT Treatment/Exercise - 12/09/15 0001    Ambulation/Gait   Ambulation/Gait Assistance 6: Modified independent (Device/Increase time)   Ambulation Distance (Feet) 500 Feet   Assistive device Rolling walker   Gait Pattern Poor foot clearance - left  as pt fatigues   Gait Comments pt required cueing for safe turns with walker, he was turning quickly and getting outside of the walker causing him to trip over the corner of the walker   Elbow Exercises   Elbow Extension Strengthening;Right;Left;20 reps;Standing;Bar  weights/barbell  kickbacks in standing with one UE support-difficult position   Bar Weights/Barbell (Elbow Extension) 3 lbs  pt very sore after last session   Lumbar Exercises: Seated   Other Seated Lumbar Exercises arms across chest, slight back lean for abdominals x 10 x 2 sets   Knee/Hip Exercises: Standing   Knee Flexion Strengthening;Right;Left;2 sets;15 reps  3# left, 2# right   Knee/Hip Exercises: Seated   Other Seated Knee/Hip Exercises seated short arc quads 3# on L and 2# on Rx 10 each x 3  could increase weight on left   Shoulder Exercises: Seated   Other Seated Exercises overhead press 3# in each hand x 10 x 3   Shoulder Exercises: Standing   Other Standing Exercises with contact guard for balance, 3-way arm raises with 2# x 10 each way                    Short Term Clinic Goals - 11/06/15 1729    CC Short Term Goal  #4   Title independent in home exercise program for bilateral LE strengthening   Time 4   Period Weeks   Status New             Long Term Clinic Goals - 12/06/15 1316    CC Long Term Goal  #1   Title circumference of right foot at 5 cm. proximal to metatarsal head reduced to 24.5 cm.   Status Achieved   CC Long Term Goal  #2   Title pain in right foot improved at least 50% in frequency and/or intensitiy   Status Achieved   CC Long Term Goal  #3   Title lymphedema life impact scale reduced to 30% or less impairment   Status Achieved   CC Long Term Goal  #4   Title Pt. will be able to use an AFO to prevent foot drop and enable safe gait (or no AFO if not needed)   Status Achieved   CC Long Term Goal  #5   Title right LE strength increased to grossly 3+/5 or more throughout   Status On-going   CC Long Term Goal  #6   Title left LE strength increased to grossly 4/5 throughout   Status Achieved   CC Long Term Goal  #7   Title independent with more advanced LE strengthening and mobility exercise program   Status On-going            Plan - 12/09/15 1518    Clinical Impression Statement Pt states he was still sore from UE exercises last session so weight was decreased during UE exercises. Gait training today x 500 ft with pt requiring cueing for safe walker management while turning. Pt demonstrates decreased foot clearance with increased fatigue.    PT Frequency 2x / week   PT Duration 4 weeks   PT Treatment/Interventions Gait training;Therapeutic exercise;ADLs/Self Care Home Management;Patient/family education   PT Next Visit Plan Continue LE and UE strengthening; work on decreasing need for using walker to take just a step or two; continue sit to stand with one arm support; refine and formalize HEP and work on transitioning to a gym program.  Continue work toward remaining two unmet goals.   Consulted and  Agree with Plan of Care Patient      Patient will benefit from skilled therapeutic intervention in order to improve the following deficits and impairments:  Increased edema, Pain, Decreased strength, Abnormal gait, Decreased activity tolerance, Decreased knowledge of  use of DME, Decreased knowledge of precautions  Visit Diagnosis: Muscle weakness (generalized)  Other abnormalities of gait and mobility     Problem List Patient Active Problem List   Diagnosis Date Noted  . Leukocytosis   . Pancreatic mass   . Abdominal pain 07/07/2015  . Pancreatitis 07/07/2015  . DVT (deep venous thrombosis) ([place]) 04/19/2015  . Abdominal fluid collection   . Sepsis ([place]) 04/18/2015  . Intra-abdominal abscess ([place]) 04/18/2015  . Cognitive deficits 03/26/2015  . Thrombocytopenia ([place]) 03/25/2015  . Decubitus ulcer of buttock, stage 2 03/25/2015  . Acute encephalopathy 03/24/2015  . 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 Rash 03/05/2015  . Malnutrition of moderate degree ([place]) 02/05/2015  . Foot drop 02/03/2015  . 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 Backache 09/18/2009  . 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 KNEE PAIN, LEFT, CHRONIC 08/07/2008  . CAROTID BRUIT, LEFT 08/07/2008  . Allergic rhinitis 08/03/2007  . 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 GERD 02/18/2007  . BPH associated with nocturia 02/18/2007    Alexia Freestone 12/09/2015, 3:21 PM  [place], [place], [address] Phone: [phone]   Fax:  [phone]  Name: Naftali Bielenberg. MRN: [id number] Date of Birth: Jan 20, 1941    Allyson Sabal, PT 12/09/2015 3:21 PM

## 2015-12-11 ENCOUNTER — Ambulatory Visit: Payer: Medicare Other | Admitting: Physical Therapy

## 2015-12-11 ENCOUNTER — Ambulatory Visit (INDEPENDENT_AMBULATORY_CARE_PROVIDER_SITE_OTHER): Payer: Medicare Other | Admitting: Adult Health

## 2015-12-11 ENCOUNTER — Encounter: Payer: Self-pay | Admitting: Physical Therapy

## 2015-12-11 ENCOUNTER — Encounter: Payer: Self-pay | Admitting: Adult Health

## 2015-12-11 ENCOUNTER — Ambulatory Visit: Payer: Self-pay | Admitting: Adult Health

## 2015-12-11 VITALS — BP 130/88 | HR 87 | Temp 98.2°F | Ht 72.0 in | Wt 160.0 lb

## 2015-12-11 DIAGNOSIS — R2689 Other abnormalities of gait and mobility: Secondary | ICD-10-CM | POA: Diagnosis not present

## 2015-12-11 DIAGNOSIS — F528 Other sexual dysfunction not due to a substance or known physiological condition: Secondary | ICD-10-CM | POA: Diagnosis not present

## 2015-12-11 DIAGNOSIS — R600 Localized edema: Secondary | ICD-10-CM | POA: Diagnosis not present

## 2015-12-11 DIAGNOSIS — M6281 Muscle weakness (generalized): Secondary | ICD-10-CM

## 2015-12-11 DIAGNOSIS — R609 Edema, unspecified: Secondary | ICD-10-CM | POA: Diagnosis not present

## 2015-12-11 DIAGNOSIS — R269 Unspecified abnormalities of gait and mobility: Secondary | ICD-10-CM | POA: Diagnosis not present

## 2015-12-11 DIAGNOSIS — M25571 Pain in right ankle and joints of right foot: Secondary | ICD-10-CM | POA: Diagnosis not present

## 2015-12-11 MED ORDER — TADALAFIL 20 MG PO TABS: 10.0000 mg | ORAL_TABLET | ORAL | Status: DC | PRN

## 2015-12-11 NOTE — Progress Notes (Signed)
Subjective:    Patient ID: Perry Iha., male    DOB: 02-Jan-1941, 75 y.o.   MRN: [id number]  HPI  75 year old male who presents to the office today for issues related to ED. He reports that he is taking up to 150mg  of Viagra and he has trouble maintaining an erection. He reports that he has been taking viagra for the last six weeks and he has yet to " get where I want to be."   He has not had any dizziness or lightheadedness while taking Viagra.   He has been recovering from lymphoma and is happy to report that he is in remission.    Review of Systems  Constitutional: Negative.   Respiratory: Negative.   Cardiovascular: Negative.   Neurological: Negative.   All other systems reviewed and are negative.  Past Medical History  Diagnosis Date  . 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 ALLERGIC RHINITIS 08/03/2007  . GERD 02/18/2007  . KNEE PAIN, LEFT, CHRONIC 08/07/2008  . BACK PAIN 09/18/2009  . HAMMER TOE 08/03/2007  . CAROTID BRUIT, LEFT 08/07/2008  . 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 Foot drop     rt foot  . Rash, skin     torso  . Hepatitis 1977    no residual problems, type A  . Sleeping difficulty   . Skin cancer   . Lymphoma [facility])     Social History   Social History  . Marital Status: Married    Spouse Name: N/A  . Number of Children: N/A  . Years of Education: N/A   Occupational History  . retired     Pharmacist, hospital, coaching   Social History Main Topics  . Smoking status: Never Smoker   . Smokeless tobacco: Not on file  . Alcohol Use: No     Comment: rarely   . Drug Use: No  . Sexual Activity: Not on file   Other Topics Concern  . Not on file   Social History Narrative   Married.  Very active.      Past Surgical History  Procedure Laterality Date  . Shoulder open rotator cuff repair  07/06/05    right  . Knee arthroscopy      left  . Tonsillectomy and adenoidectomy    .  Transurethral resection of prostate    . Cataract extraction w/ intraocular lens  implant, bilateral Bilateral   . 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 Heart attack Other   . Aneurysm Other     aortic  . Coronary artery disease Sister   . Prostate cancer Father   . Pulmonary fibrosis Mother   . Uterine cancer Mother     Allergies  Allergen Reactions  . Antihistamines, Diphenhydramine-Type     CAUSES PROSTATE TO SWELL & as a child had hyperactivity when took Benadryl Pt took Pepcid w/ Benadryl PO and did fine per Pt & his wife    Current Outpatient Prescriptions on File Prior to Visit  Medication Sig Dispense Refill  . famotidine (PEPCID) 20 MG tablet Take 20 mg by mouth daily.     Marland Kitchen ibuprofen (ADVIL,MOTRIN) 200 MG tablet Take 400 mg by mouth every 4 (four) hours as needed for  fever, headache, mild pain, moderate pain or cramping.    Marland Kitchen LORazepam (ATIVAN) 0.5 MG tablet TAKE 1 TABLET BY MOUTH TWICE DAILY AS NEEDED FOR ANXIETY 100 tablet 0  . mirtazapine (REMERON) 7.5 MG tablet Take 2 tablets total 15 mg at bedtime. 60 tablet 3  . oxymetazoline (AFRIN) 0.05 % nasal spray Place 1 spray into both nostrils 2 (two) times daily as needed for congestion.    Vladimir Faster Glycol-Propyl Glycol (SYSTANE OP) Apply 1 drop to eye daily as needed (dryness).    . polyethylene glycol (MIRALAX / GLYCOLAX) packet Take 17 g by mouth daily as needed. 14 each 0  . feeding supplement, ENSURE ENLIVE, (ENSURE ENLIVE) LIQD Take 237 mLs by mouth 2 (two) times daily between meals. (Patient not taking: Reported on 12/11/2015) 237 mL 12   No current facility-administered medications on file prior to visit.    BP 130/88 mmHg  Pulse 87  Temp(Src) 98.2 F (36.8 C) (Oral)  Ht 6' (1.829 m)  Wt 160 lb (72.576 kg)  BMI 21.70 kg/m2   SpO2 97%       Objective:   Physical Exam  Constitutional: He is oriented to person, place, and time. He appears well-developed and well-nourished. No distress.  Neurological: He is alert and oriented to person, place, and time.  Skin: Skin is warm and dry. No rash noted. He is not diaphoretic. No erythema. No pallor.  Psychiatric: He has a normal mood and affect. His behavior is normal. Judgment and thought content normal.  Vitals reviewed.     Assessment & Plan:  1. ERECTILE DYSFUNCTION, MILD - tadalafil (CIALIS) 20 MG tablet; Take 0.5-1 tablets (10-20 mg total) by mouth every other day as needed for erectile dysfunction.  Dispense: 20 tablet; Refill: 11

## 2015-12-11 NOTE — Progress Notes (Signed)
Pre visit review using our clinic review tool, if applicable. No additional management support is needed unless otherwise documented below in the visit note. 

## 2015-12-11 NOTE — Therapy (Signed)
[place], [place], [address] Phone: [phone]   Fax:  [phone]  Physical Therapy Treatment  Patient Details  Name: Perry Nielsen. MRN: [id number] Date of Birth: December 27, 1940 Referring Provider: Dr. Julieanne Manson  Encounter Date: 12/11/2015      PT End of Session - 12/11/15 1522    Visit Number 26   Number of Visits 32   Date for PT Re-Evaluation 01/10/16   Authorization Type April cert done [id number]   PT Start Time 1438   PT Stop Time 1520   PT Time Calculation (min) 42 min   Activity Tolerance Patient tolerated treatment well   Behavior During Therapy [facility] for tasks assessed/performed      Past Medical History  Diagnosis Date  . 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 ALLERGIC RHINITIS 08/03/2007  . GERD 02/18/2007  . KNEE PAIN, LEFT, CHRONIC 08/07/2008  . BACK PAIN 09/18/2009  . HAMMER TOE 08/03/2007  . CAROTID BRUIT, LEFT 08/07/2008  . 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 Foot drop     rt foot  . Rash, skin     torso  . Hepatitis 1977    no residual problems, type A  . Sleeping difficulty   . Skin cancer   . Lymphoma [facility])     Past Surgical History  Procedure Laterality Date  . Shoulder open rotator cuff repair  07/06/05    right  . Knee arthroscopy      left  . Tonsillectomy and adenoidectomy    . Transurethral resection of prostate    . Cataract extraction w/ intraocular lens  implant, bilateral Bilateral   . 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      Subjective Assessment - 12/11/15 1442    Subjective I didn't swim today. I have not been sleeping well at night. I can't get my foot comfortable.    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  Was diagnosed in early August.  Treatment has included radiation for 10 days and had 2 chemos; didn't tolerate the 6 that were planned.    Memory loss started about four weeks; trying to taper meds to ese if that helps.  Was healthy prior to this diagnosis.  Had blood clots in legs in October so is on Xarelto for six months.   Patient Stated Goals get help with drop foot   Currently in Pain? No/denies   Pain Score 0-No pain                         [facility] Adult PT Treatment/Exercise - 12/11/15 0001    Ambulation/Gait   Ambulation/Gait Assistance 6: Modified independent (Device/Increase time)   Ambulation Distance (Feet) 500 Feet   Assistive device Rolling walker   Gait Comments pt required cues to stand up straight and look straight ahead   Elbow Exercises   Elbow Extension Strengthening;Right;Left;20 reps;Standing;Bar weights/barbell  overhead tricep extension in sitting   Bar Weights/Barbell (Elbow Extension) 3 lbs   Lumbar Exercises: Seated   Other Seated Lumbar Exercises arms across chest, slight back lean for abdominals x 10 x 2 sets   Knee/Hip Exercises:  Aerobic   Nustep level 3 x 5 minutes with pt reporting fatigue after this   Knee/Hip Exercises: Seated   Other Seated Knee/Hip Exercises seated short arc quads 3# on L and 2# on Rx 10 each x 3  could increase weight on left   Sit to Sand 10 reps;with UE support;1 set  with 1 hand support alternating between left and right    Shoulder Exercises: Seated   Other Seated Exercises overhead press 3# in each hand x 10 x 3  pt reported the left side was sore   Shoulder Exercises: Standing   Other Standing Exercises with contact guard for balance, 3-way arm raises with 2# x 10 each way  pt reports this was very difficult and he felt fatigued                    Short Term Clinic Goals - 12/11/15 1526    CC Short Term Goal  #4   Title independent  in home exercise program for bilateral LE strengthening   Time 4   Period Weeks   Status On-going             Long Term Clinic Goals - 12/11/15 1526    CC Long Term Goal  #5   Title right LE strength increased to grossly 3+/5 or more throughout   Time 8   Period Weeks   Status On-going   CC Long Term Goal  #7   Title independent with more advanced LE strengthening and mobility exercise program   Time 8   Period Weeks   Status On-going            Plan - 12/11/15 1522    Clinical Impression Statement Pt was sore from exercises last session. He was tired today and states he has not been sleeping well. Pt encouraged to discuss this with his doctor when he visits today. Gait trianing today x 500 ft. Pt did not require cues for turning with his walker. He did require cues to look straight ahead. He was able to complete 5 min on the NuStep. Weights and reps were not increased this session on exercises secondary to pt's fatigue and muscle soreness.    Rehab Potential Good   PT Frequency 2x / week   PT Duration 4 weeks   PT Treatment/Interventions Gait training;Therapeutic exercise;ADLs/Self Care Home Management;Patient/family education   PT Next Visit Plan Continue LE and UE strengthening; work on decreasing need for using walker to take just a step or two; continue sit to stand with one arm support; refine and formalize HEP and work on transitioning to a gym program.  Continue work toward remaining two unmet goals.   Consulted and Agree with Plan of Care Patient      Patient will benefit from skilled therapeutic intervention in order to improve the following deficits and impairments:  Increased edema, Pain, Decreased strength, Abnormal gait, Decreased activity tolerance, Decreased knowledge of use of DME, Decreased knowledge of precautions  Visit Diagnosis: Muscle weakness (generalized)  Other abnormalities of gait and mobility     Problem List Patient Active Problem List    Diagnosis Date Noted  . Leukocytosis   . Pancreatic mass   . Abdominal pain 07/07/2015  . Pancreatitis 07/07/2015  . DVT (deep venous thrombosis) ([place]) 04/19/2015  . Abdominal fluid collection   . Sepsis ([place]) 04/18/2015  . Intra-abdominal abscess ([place]) 04/18/2015  . Cognitive deficits 03/26/2015  . Thrombocytopenia ([place]) 03/25/2015  . Decubitus  ulcer of buttock, stage 2 03/25/2015  . Acute encephalopathy 03/24/2015  . 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 Rash 03/05/2015  . Malnutrition of moderate degree ([place]) 02/05/2015  . Foot drop 02/03/2015  . 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 Backache 09/18/2009  . 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 KNEE PAIN, LEFT, CHRONIC 08/07/2008  . CAROTID BRUIT, LEFT 08/07/2008  . Allergic rhinitis 08/03/2007  . 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 GERD 02/18/2007  . BPH associated with nocturia 02/18/2007    Alexia Freestone 12/11/2015, 3:27 PM  [place], [place], [address] Phone: [phone]   Fax:  [phone]  Name: Perry Nielsen. MRN: [id number] Date of Birth: 28-Jan-1941    Allyson Sabal, PT 12/11/2015 3:27 PM

## 2015-12-11 NOTE — Patient Instructions (Signed)
It was great meeting you today.   Please let me know if this medication does not work for you.

## 2015-12-16 ENCOUNTER — Ambulatory Visit: Payer: Medicare Other | Admitting: Physical Therapy

## 2015-12-16 DIAGNOSIS — M6281 Muscle weakness (generalized): Secondary | ICD-10-CM

## 2015-12-16 DIAGNOSIS — R2689 Other abnormalities of gait and mobility: Secondary | ICD-10-CM

## 2015-12-16 DIAGNOSIS — R600 Localized edema: Secondary | ICD-10-CM | POA: Diagnosis not present

## 2015-12-16 DIAGNOSIS — R609 Edema, unspecified: Secondary | ICD-10-CM | POA: Diagnosis not present

## 2015-12-16 DIAGNOSIS — R269 Unspecified abnormalities of gait and mobility: Secondary | ICD-10-CM | POA: Diagnosis not present

## 2015-12-16 DIAGNOSIS — M25571 Pain in right ankle and joints of right foot: Secondary | ICD-10-CM | POA: Diagnosis not present

## 2015-12-16 NOTE — Therapy (Signed)
[place], [place], [address] Phone: [phone]   Fax:  [phone]  Physical Therapy Treatment  Patient Details  Name: Perry Nielsen. MRN: [id number] Date of Birth: September 22, 1940 Referring Provider: Dr. Julieanne Manson  Encounter Date: 12/16/2015      PT End of Session - 12/16/15 1717    Visit Number 27   Number of Visits 32   Date for PT Re-Evaluation 01/10/16   PT Start Time 1520   PT Stop Time 1605   PT Time Calculation (min) 45 min   Activity Tolerance Patient tolerated treatment well   Behavior During Therapy [facility] for tasks assessed/performed      Past Medical History  Diagnosis Date  . 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 ALLERGIC RHINITIS 08/03/2007  . GERD 02/18/2007  . KNEE PAIN, LEFT, CHRONIC 08/07/2008  . BACK PAIN 09/18/2009  . HAMMER TOE 08/03/2007  . CAROTID BRUIT, LEFT 08/07/2008  . 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 Foot drop     rt foot  . Rash, skin     torso  . Hepatitis 1977    no residual problems, type A  . Sleeping difficulty   . Skin cancer   . Lymphoma [facility])     Past Surgical History  Procedure Laterality Date  . Shoulder open rotator cuff repair  07/06/05    right  . Knee arthroscopy      left  . Tonsillectomy and adenoidectomy    . Transurethral resection of prostate    . Cataract extraction w/ intraocular lens  implant, bilateral Bilateral   . 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      Subjective Assessment - 12/16/15 1523    Subjective I still don't sleep good at night.  I'm taking something for that, but it doesn't help much.  "[name]'s pretty tough with me (about taking care of the swelling."  Hadn't been swimming in  about four days.  Plans to continue mainly with swimming after therapy is done."  Says he has been walking a little at home without the walker but holding onto a wall or furniture.   Currently in Pain? No/denies            [facility] PT Assessment - 12/16/15 0001    Strength   Right Hip Flexion 3+/5   Right Knee Extension 4/5   Right Ankle Dorsiflexion 3-/5   Right Ankle Plantar Flexion --  good resistance in sitting   Right Ankle Inversion 3-/5   Right Ankle Eversion 3-/5                     [facility] Adult PT Treatment/Exercise - 12/16/15 0001    Elbow Exercises   Elbow Flexion Strengthening;Right;Left;Other reps (comment);Seated;Bar weights/barbell  3 x 10   Bar Weights/Barbell (Elbow Flexion) 5 lbs   Elbow Extension Strengthening;Right;Left;10 reps;Seated;Bar weights/barbell  overhead tricep extension in sitting   Bar Weights/Barbell (Elbow Extension) 4 lbs  4# x 10+15 right; 4# x 10, then 3# x 15 left   Knee/Hip Exercises: Standing   Knee Flexion Strengthening;Right;Left;2 sets;15 reps  4# left, 3# right   Lateral Step Up Right;10 reps;Hand Hold: 2;Step Height: 6"   Forward Step  Up Right;Left;10 reps;Hand Hold: 2;Step Height: 6"   Forward Step Up Limitations min assist with belt   Shoulder Exercises: Seated   Other Seated Exercises overhead press 3# in each hand x 10 x 3  pt reported the left side was sore   Other Seated Exercises 3-way arm raises 2# x 10 each way   Ankle Exercises: Seated   Ankle Circles/Pumps AROM;Right;5 reps  clockwise and counter-   Other Seated Ankle Exercises active dorsiflexion, inversion, eversion on right x 10 each                   Short Term Clinic Goals - 12/11/15 1526    CC Short Term Goal  #4   Title independent in home exercise program for bilateral LE strengthening   Time 4   Period Weeks   Status On-going             Long Term Clinic Goals - 12/16/15 1722    CC Long Term Goal  #5   Title right LE  strength increased to grossly 3+/5 or more throughout   Status Not Met            Plan - 12/16/15 1718    Clinical Impression Statement Right leg strength partially reassessed:  patient has made some slight improvements, but has not met strength goal of 3+/5 throughout and is unlikely to do so.  He reported today that at home he does some walking without his walker, surprising since in clinic he is very dependent on it, but says he can do this if he has walls or furniture to hold onto.  He also reports he has a cane at home so was asked to bring that to his next appt. so that we could assess his gait with that.  He sounds like he will not join a gym at end of treatment but plans to continue swimming and has some weights to continue strengthening at home.  It appears that his right foot edema reductions are being maintained with his home program and with the help and insistence of his wife.   Rehab Potential Good   PT Frequency 2x / week   PT Duration 4 weeks   PT Treatment/Interventions Gait training;Therapeutic exercise;ADLs/Self Care Home Management;Patient/family education   PT Next Visit Plan Pt. is to bring his cane in for [place] to look at with him, to assess his walking with it.  Work on Personnel officer with that if appropriate; determine whether to decrease frequency to one time a week and or DC soon.   PT Home Exercise Plan LE strengthening, swimming   Consulted and Agree with Plan of Care Patient      Patient will benefit from skilled therapeutic intervention in order to improve the following deficits and impairments:  Increased edema, Pain, Decreased strength, Abnormal gait, Decreased activity tolerance, Decreased knowledge of use of DME, Decreased knowledge of precautions  Visit Diagnosis: Muscle weakness (generalized)  Other abnormalities of gait and mobility     Problem List Patient Active Problem List   Diagnosis Date Noted  . Leukocytosis   . Pancreatic mass   .  Abdominal pain 07/07/2015  . Pancreatitis 07/07/2015  . DVT (deep venous thrombosis) ([place]) 04/19/2015  . Abdominal fluid collection   . Sepsis ([place]) 04/18/2015  . Intra-abdominal abscess ([place]) 04/18/2015  . Cognitive deficits 03/26/2015  . Thrombocytopenia ([place]) 03/25/2015  . Decubitus ulcer of buttock, stage 2 03/25/2015  . Acute encephalopathy 03/24/2015  . Lymphoma,  large cell ([place]) 03/07/2015  . Rash 03/05/2015  . Malnutrition of moderate degree ([place]) 02/05/2015  . Foot drop 02/03/2015  . 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 Backache 09/18/2009  . 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 KNEE PAIN, LEFT, CHRONIC 08/07/2008  . CAROTID BRUIT, LEFT 08/07/2008  . Allergic rhinitis 08/03/2007  . 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 GERD 02/18/2007  . BPH associated with nocturia 02/18/2007    Pearl Berlinger 12/16/2015, 5:23 PM  [place], [place], [address] Phone: [phone]   Fax:  [phone]  Name: Clayson Riling. MRN: [id number] Date of Birth: 12-Apr-1941    Serafina Royals, PT 12/16/2015 5:23 PM

## 2015-12-18 ENCOUNTER — Ambulatory Visit: Payer: Medicare Other

## 2015-12-18 DIAGNOSIS — R600 Localized edema: Secondary | ICD-10-CM | POA: Diagnosis not present

## 2015-12-18 DIAGNOSIS — R609 Edema, unspecified: Secondary | ICD-10-CM | POA: Diagnosis not present

## 2015-12-18 DIAGNOSIS — R269 Unspecified abnormalities of gait and mobility: Secondary | ICD-10-CM

## 2015-12-18 DIAGNOSIS — R2689 Other abnormalities of gait and mobility: Secondary | ICD-10-CM | POA: Diagnosis not present

## 2015-12-18 DIAGNOSIS — M6281 Muscle weakness (generalized): Secondary | ICD-10-CM

## 2015-12-18 DIAGNOSIS — M25571 Pain in right ankle and joints of right foot: Secondary | ICD-10-CM

## 2015-12-18 NOTE — Therapy (Signed)
[place], [place], [address] Phone: [phone]   Fax:  [phone]  Physical Therapy Treatment  Patient Details  Name: Perry Nielsen. MRN: [id number] Date of Birth: 13-Dec-1940 Referring Provider: Dr. Julieanne Manson  Encounter Date: 12/18/2015      PT End of Session - 12/18/15 1715    Visit Number 28   Number of Visits 32   Date for PT Re-Evaluation 01/10/16   PT Start Time 1438   PT Stop Time 1524   PT Time Calculation (min) 46 min   Activity Tolerance Patient tolerated treatment well   Behavior During Therapy [facility] for tasks assessed/performed      Past Medical History  Diagnosis Date  . 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 ALLERGIC RHINITIS 08/03/2007  . GERD 02/18/2007  . KNEE PAIN, LEFT, CHRONIC 08/07/2008  . BACK PAIN 09/18/2009  . HAMMER TOE 08/03/2007  . CAROTID BRUIT, LEFT 08/07/2008  . 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 Foot drop     rt foot  . Rash, skin     torso  . Hepatitis 1977    no residual problems, type A  . Sleeping difficulty   . Skin cancer   . Lymphoma [facility])     Past Surgical History  Procedure Laterality Date  . Shoulder open rotator cuff repair  07/06/05    right  . Knee arthroscopy      left  . Tonsillectomy and adenoidectomy    . Transurethral resection of prostate    . Cataract extraction w/ intraocular lens  implant, bilateral Bilateral   . 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      Subjective Assessment - 12/18/15 1511    Subjective I'm still having trouble falling asleep at night due to my Rt foot aching, but once I fall asleep I don't wake up. Switched to wearing my compression garment at night since the velcro  garment is too uncomfortable for me right now.  Have just started walking with my cane a little more around the house, brought it today like Butch Penny asked. Went swimming this morning and I 've been trying to use my Rt LE more.     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  Was diagnosed in early August.  Treatment has included radiation for 10 days and had 2 chemos; didn't tolerate the 6 that were planned.    Memory loss started about four weeks; trying to taper meds to ese if that helps.  Was healthy prior to this diagnosis.  Had blood clots in legs in October so is on Xarelto for six months.   Patient Stated Goals get help with drop foot   Currently in Pain? No/denies                         [facility] Adult PT Treatment/Exercise - 12/18/15 0001    Ambulation/Gait   Ambulation/Gait Assistance 5: Supervision;6: Modified independent (Device/Increase time)   Ambulation Distance (Feet) 160 Feet   Assistive device Small based quad cane  3 pt cane, not quad   Gait Pattern Step-to pattern;Decreased stride length   Gait Comments Pt  did excellent with VC and then returning demonstration of use of his cane. At first was not taking equal lengthed steps, then after PTA demo he was able to return proper technique only requiring VC to hold head up which he still needs to work on. Pt was very steady and safe and though PTA initally held onto pts belt, did not cont doing that as pt was doing so well.    Self-Care   Other Self-Care Comments  Reviewed what pt does in the pool and he reports has been using his Rt LE more. Demonstrated to pt how he can practice standing on his Rt LE in the pool and practicing stepping forward and backwards while weight bearing on the Rt LE. Also try to do his heel and toe raises and pt verbalized understanding and would try this. Also reminded pt that he wasn't supposed to be sleeping in his compression day  time garments which he had been doing since he reported the velcro garment was too uncomfortable to sleep in. Suggested he try wearing nothing as he reports he doesn't have much increased swelling first thing in the morning, and if it does start to increase to try the velcro garment again, just NOT the day time garment explaining how it can create a tourniquet affect and this is what was probably making his foot so sore. Wife and pt verbalized understanding all of this.                   Short Term Clinic Goals - 12/11/15 1526    CC Short Term Goal  #4   Title independent in home exercise program for bilateral LE strengthening   Time 4   Period Weeks   Status On-going             Long Term Clinic Goals - 12/16/15 1722    CC Long Term Goal  #5   Title right LE strength increased to grossly 3+/5 or more throughout   Status Not Met            Plan - 12/18/15 1715    Clinical Impression Statement Pt brought his cane today and did excellent with instruction of proper use after demonstration and therapist had to lower his cane as it was too high for pt causing him to lean forward with it. The more we practiced today the more comfortable he became and even began using a step through pattern when therapist was walking pt to the car.    Rehab Potential Good   PT Frequency 2x / week   PT Duration 4 weeks   PT Treatment/Interventions Gait training;Therapeutic exercise;ADLs/Self Care Home Management;Patient/family education   PT Next Visit Plan Assess how pt is doing with his cane and assess all goals. Possibly D/C next visit per Serafina Royals, PT who saw him last visit.    PT Home Exercise Plan LE strengthening, swimming, ambulate with cane   Consulted and Agree with Plan of Care Patient      Patient will benefit from skilled therapeutic intervention in order to improve the following deficits and impairments:  Increased edema, Pain, Decreased strength, Abnormal gait,  Decreased activity tolerance, Decreased knowledge of use of DME, Decreased knowledge of precautions  Visit Diagnosis: Muscle weakness (generalized)  Other abnormalities of gait and mobility  Gait abnormality  Localized edema  Edema, unspecified  Pain in right ankle and joints of right foot     Problem List Patient Active Problem List  Diagnosis Date Noted  . Leukocytosis   . Pancreatic mass   . Abdominal pain 07/07/2015  . Pancreatitis 07/07/2015  . DVT (deep venous thrombosis) ([place]) 04/19/2015  . Abdominal fluid collection   . Sepsis ([place]) 04/18/2015  . Intra-abdominal abscess ([place]) 04/18/2015  . Cognitive deficits 03/26/2015  . Thrombocytopenia ([place]) 03/25/2015  . Decubitus ulcer of buttock, stage 2 03/25/2015  . Acute encephalopathy 03/24/2015  . 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 Rash 03/05/2015  . Malnutrition of moderate degree ([place]) 02/05/2015  . Foot drop 02/03/2015  . 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 Backache 09/18/2009  . 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 KNEE PAIN, LEFT, CHRONIC 08/07/2008  . CAROTID BRUIT, LEFT 08/07/2008  . Allergic rhinitis 08/03/2007  . 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 GERD 02/18/2007  . BPH associated with nocturia 02/18/2007    Otelia Limes, PTA 12/18/2015, 5:23 PM  [place], [place], [address] Phone: [phone]   Fax:  [phone]  Name: Perry Nielsen. MRN: [id number] Date of Birth: Jul 23, 1940

## 2015-12-24 ENCOUNTER — Encounter: Payer: Self-pay | Admitting: Physical Therapy

## 2015-12-25 ENCOUNTER — Encounter: Payer: Self-pay | Admitting: Physical Therapy

## 2016-01-01 ENCOUNTER — Ambulatory Visit: Payer: Medicare Other | Admitting: Physical Therapy

## 2016-01-01 DIAGNOSIS — M25571 Pain in right ankle and joints of right foot: Secondary | ICD-10-CM | POA: Diagnosis not present

## 2016-01-01 DIAGNOSIS — R600 Localized edema: Secondary | ICD-10-CM | POA: Diagnosis not present

## 2016-01-01 DIAGNOSIS — M6281 Muscle weakness (generalized): Secondary | ICD-10-CM | POA: Diagnosis not present

## 2016-01-01 DIAGNOSIS — R609 Edema, unspecified: Secondary | ICD-10-CM | POA: Diagnosis not present

## 2016-01-01 DIAGNOSIS — R269 Unspecified abnormalities of gait and mobility: Secondary | ICD-10-CM | POA: Diagnosis not present

## 2016-01-01 DIAGNOSIS — R2689 Other abnormalities of gait and mobility: Secondary | ICD-10-CM

## 2016-01-01 NOTE — Therapy (Signed)
[place], [place], [address] Phone: [phone]   Fax:  [phone]  Physical Therapy Treatment  Patient Details  Name: Perry Nielsen. MRN: [id number] Date of Birth: [birth year] Referring Provider: Dr. Julieanne Manson  Encounter Date: 01/01/2016      PT End of Session - 01/01/16 1715    Visit Number 29   Number of Visits 32   Date for PT Re-Evaluation 01/10/16   PT Start Time 1525   PT Stop Time 1606   PT Time Calculation (min) 41 min   Activity Tolerance Patient tolerated treatment well   Behavior During Therapy [facility] for tasks assessed/performed      Past Medical History  Diagnosis Date  . 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 ALLERGIC RHINITIS 08/03/2007  . GERD 02/18/2007  . KNEE PAIN, LEFT, CHRONIC 08/07/2008  . BACK PAIN 09/18/2009  . HAMMER TOE 08/03/2007  . CAROTID BRUIT, LEFT 08/07/2008  . 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 Foot drop     rt foot  . Rash, skin     torso  . Hepatitis 1977    no residual problems, type A  . Sleeping difficulty   . Skin cancer   . Lymphoma [facility])     Past Surgical History  Procedure Laterality Date  . Shoulder open rotator cuff repair  07/06/05    right  . Knee arthroscopy      left  . Tonsillectomy and adenoidectomy    . Transurethral resection of prostate    . Cataract extraction w/ intraocular lens  implant, bilateral Bilateral   . 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      Subjective Assessment - 01/01/16 1528    Subjective Has been using the cane as much as possible, including at the beach.  Is afraid of falling, but thinks he can use good judgement about when to use the walker.  Has been doing swimming  most days, including in pools at the beach.  Pedaled a stationary bike at the beach as well, and that felt good.  Got new shoes and those help too.  Swellling is staying fairly well-controlled, though he is not wearing the wraps all the time.     Currently in Pain? No/denies                         [facility] Adult PT Treatment/Exercise - 01/01/16 0001    Ambulation/Gait   Ambulation/Gait Yes   Ambulation/Gait Assistance 5: Supervision   Ambulation/Gait Assistance Details 400   Assistive device Other (Comment)  with two canes:  his Hurrycane and another straight cane   Stairs Yes   Stairs Assistance 5: Supervision   Stairs Assistance Details (indicate cue type and reason) railing right, cane left hand, step to pattern   Stair Management Technique One rail Right;With cane   Number of Stairs 4   Gait Comments Patient trained in proper use of two canes; struggled with rhythm at times, tended to keep canes just in front of his feet, tended to move canes too far out in front of him, looked down a lot   Self-Care   Other Self-Care Comments  [facility]                PT Education - 01/01/16 1714    Education provided Yes   Education Details [employer] [facility] [employer]) Educated Patient   Methods Explanation;Handout   Comprehension Verbalized understanding           Short Term Clinic Goals - 12/11/15 1526    CC Short Term Goal  #4   Title independent in home exercise program for bilateral LE strengthening   Time 4   Period Weeks   Status On-going             Long Term Clinic Goals - 12/16/15 1722    CC Long Term Goal  #5   Title right LE strength increased to grossly 3+/5 or more throughout   Status Not Met            Plan - 01/01/16 1715    Clinical Impression Statement Patient worked on using two canes today and did feel steadier this way than with just one, though he has been using just one most of the time the  past couple weeks. He and his wife are  managing his swelling pretty well, and he is swimming fairly regularly.  He is nearly ready for discharge.   Rehab Potential Good   PT Frequency 2x / week   PT Duration 4 weeks   PT Treatment/Interventions Gait training;Therapeutic exercise;ADLs/Self Care Home Management;Patient/family education   PT Next Visit Plan One more session for gait training using two canes to improve technique, independence, and safety.  Doublecheck goals.  Discharge next visit.   PT Home Exercise Plan LE strengthening, swimming, ambulate with cane   Consulted and Agree with Plan of Care Patient      Patient will benefit from skilled therapeutic intervention in order to improve the following deficits and impairments:  Increased edema, Pain, Decreased strength, Abnormal gait, Decreased activity tolerance, Decreased knowledge of use of DME, Decreased knowledge of precautions  Visit Diagnosis: Muscle weakness (generalized)  Other abnormalities of gait and mobility     Problem List Patient Active Problem List   Diagnosis Date Noted  . Leukocytosis   . Pancreatic mass   . Abdominal pain 07/07/2015  . Pancreatitis 07/07/2015  . DVT (deep venous thrombosis) ([place]) 04/19/2015  . Abdominal fluid collection   . Sepsis ([place]) 04/18/2015  . Intra-abdominal abscess ([place]) 04/18/2015  . Cognitive deficits 03/26/2015  . Thrombocytopenia ([place]) 03/25/2015  . Decubitus ulcer of buttock, stage 2 03/25/2015  . Acute encephalopathy 03/24/2015  . 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 Rash 03/05/2015  . Malnutrition of moderate degree ([place]) 02/05/2015  . Foot drop 02/03/2015  . 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 Backache 09/18/2009  . 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 KNEE PAIN, LEFT, CHRONIC 08/07/2008  . CAROTID BRUIT, LEFT 08/07/2008  . Allergic rhinitis 08/03/2007  . 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 GERD 02/18/2007  . BPH associated with  nocturia 02/18/2007    SALISBURY,DONNA 01/01/2016, 5:18 PM  [place], [place], [address] Phone: [phone]   Fax:  [phone]  Name: Perry Nielsen. MRN: [id number] Date of Birth: Sep 21, 1940    Serafina Royals, PT 01/01/2016 5:18 PM

## 2016-01-02 ENCOUNTER — Ambulatory Visit: Payer: Medicare Other | Admitting: Physical Therapy

## 2016-01-02 ENCOUNTER — Encounter: Payer: Self-pay | Admitting: Physical Therapy

## 2016-01-02 DIAGNOSIS — R2689 Other abnormalities of gait and mobility: Secondary | ICD-10-CM | POA: Diagnosis not present

## 2016-01-02 DIAGNOSIS — R269 Unspecified abnormalities of gait and mobility: Secondary | ICD-10-CM | POA: Diagnosis not present

## 2016-01-02 DIAGNOSIS — M25571 Pain in right ankle and joints of right foot: Secondary | ICD-10-CM | POA: Diagnosis not present

## 2016-01-02 DIAGNOSIS — R600 Localized edema: Secondary | ICD-10-CM | POA: Diagnosis not present

## 2016-01-02 DIAGNOSIS — M6281 Muscle weakness (generalized): Secondary | ICD-10-CM | POA: Diagnosis not present

## 2016-01-02 DIAGNOSIS — R609 Edema, unspecified: Secondary | ICD-10-CM | POA: Diagnosis not present

## 2016-01-02 NOTE — Therapy (Signed)
[place], [place], [address] Phone: [phone]   Fax:  [phone]  Physical Therapy Treatment  Patient Details  Name: Perry Nielsen. MRN: [id number] Date of Birth: 1941/03/06 Referring Provider: Dr. Julieanne Manson  Encounter Date: 01/02/2016      PT End of Session - 01/02/16 1608    Visit Number 30   Number of Visits 32   Date for PT Re-Evaluation 01/10/16   PT Start Time 1508   PT Stop Time 1605   PT Time Calculation (min) 57 min   Activity Tolerance Patient tolerated treatment well   Behavior During Therapy [facility] for tasks assessed/performed      Past Medical History  Diagnosis Date  . 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 ALLERGIC RHINITIS 08/03/2007  . GERD 02/18/2007  . KNEE PAIN, LEFT, CHRONIC 08/07/2008  . BACK PAIN 09/18/2009  . HAMMER TOE 08/03/2007  . CAROTID BRUIT, LEFT 08/07/2008  . 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 Foot drop     rt foot  . Rash, skin     torso  . Hepatitis 1977    no residual problems, type A  . Sleeping difficulty   . Skin cancer   . Lymphoma [facility])     Past Surgical History  Procedure Laterality Date  . Shoulder open rotator cuff repair  07/06/05    right  . Knee arthroscopy      left  . Tonsillectomy and adenoidectomy    . Transurethral resection of prostate    . Cataract extraction w/ intraocular lens  implant, bilateral Bilateral   . 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      Subjective Assessment - 01/02/16 1510    Subjective I still get the sequence confused and I have some difficulty with curbs and steps. I went fishing with just one cane today.    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  Was diagnosed in early August.  Treatment has included radiation for 10 days and had 2 chemos; didn't tolerate the 6 that were planned.    Memory loss started about four weeks; trying to taper meds to ese if that helps.  Was healthy prior to this diagnosis.  Had blood clots in legs in October so is on Xarelto for six months.   Patient Stated Goals get help with drop foot   Currently in Pain? No/denies   Pain Score 0-No pain                         [facility] Adult PT Treatment/Exercise - 01/02/16 0001    Ambulation/Gait   Ambulation/Gait Yes   Ambulation/Gait Assistance 6: Modified independent (Device/Increase time)   Ambulation/Gait Assistance Details 500   Assistive device Other (Comment)  pt ambulated with 1 cane in each hand x 500 ft x 2    Stairs Yes   Stairs Assistance 6: Modified independent (Device/Increase time)   Stairs Assistance Details (indicate cue type and reason) practiced ascending/descending 4 steps using one cane whilie holding other cane in opposite hand on rail. After practicing going up with right  and left it was determined pt was safer when ascending steps with right foot first and descending steps with right foot first. Practiced with railing only on right and only on left. Also practiced going up/down curb with use of both canes. Gave pt a cheat sheet for points to remember with stairs and gait using both canes.    Stair Management Technique One rail Right;With cane   Number of Stairs 4  up/down x 10 sets   Gait Comments Pt did not require any cueing when ambulating on flat surfaces with bilateral canes. He demonstrated good sequnce but was cued once to look straight ahead.                 PT Education - 01/01/16 1714    Education provided Yes   Education Details [employer] [facility] [employer]) Educated Patient   Methods Explanation;Handout    Comprehension Verbalized understanding           Short Term Clinic Goals - 2016-01-08 1557    CC Short Term Goal  #4   Title independent in home exercise program for bilateral LE strengthening   Baseline Jan 08, 2016 - pt reports independence with HEP   Time 4   Period Weeks   Status Achieved             Long Term Clinic Goals - Jan 08, 2016 1557    CC Long Term Goal  #7   Title independent with more advanced LE strengthening and mobility exercise program   Baseline pt now indep with advanced exercise program including gait training and stair training   Status Achieved            Plan - 01/08/16 1615    Clinical Impression Statement Pt has now met all goals for therapy. He demonstrate good recall of sequencing with ambulating with bilateral canes. Spent the entire session today with focus on stair training and curb training. Gave pt a cheat sheet of helpful hints with ambuating up/down stairs and curb. Pt is safest when stepping up and down leading with right foot. Practiced with rail on right and left and holding one cane in hand on rail and other cane in opposite hand. Pt has now met all goals for therapy and will be discharged from skilled PT services at this time.    Rehab Potential Good   PT Frequency 2x / week   PT Duration 4 weeks   PT Treatment/Interventions Gait training;Therapeutic exercise;ADLs/Self Care Home Management;Patient/family education   PT Next Visit Plan dc this visit   PT Home Exercise Plan LE strengthening, swimming, ambulate with cane   Consulted and Agree with Plan of Care Patient      Patient will benefit from skilled therapeutic intervention in order to improve the following deficits and impairments:  Increased edema, Pain, Decreased strength, Abnormal gait, Decreased activity tolerance, Decreased knowledge of use of DME, Decreased knowledge of precautions  Visit Diagnosis: Muscle weakness (generalized)  Other abnormalities of gait and  mobility  Gait abnormality       G-Codes - 01/08/16 1608    Functional Assessment Tool Used clinical judgement   Functional Limitation Self care   Self Care Goal Status (X5056) At least 1 percent but less than 20 percent impaired, limited or restricted   Self Care Discharge Status [phone]) At least 1 percent but less than 20 percent impaired, limited or restricted      Problem List Patient Active Problem List   Diagnosis Date  Noted  . Leukocytosis   . Pancreatic mass   . Abdominal pain 07/07/2015  . Pancreatitis 07/07/2015  . DVT (deep venous thrombosis) ([place]) 04/19/2015  . Abdominal fluid collection   . Sepsis ([place]) 04/18/2015  . Intra-abdominal abscess ([place]) 04/18/2015  . Cognitive deficits 03/26/2015  . Thrombocytopenia ([place]) 03/25/2015  . Decubitus ulcer of buttock, stage 2 03/25/2015  . Acute encephalopathy 03/24/2015  . 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 Rash 03/05/2015  . Malnutrition of moderate degree ([place]) 02/05/2015  . Foot drop 02/03/2015  . 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 Backache 09/18/2009  . 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 KNEE PAIN, LEFT, CHRONIC 08/07/2008  . CAROTID BRUIT, LEFT 08/07/2008  . Allergic rhinitis 08/03/2007  . 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 GERD 02/18/2007  . BPH associated with nocturia 02/18/2007    Alexia Freestone 01/02/2016, 4:17 PM  [place], [place], [address] Phone: [phone]   Fax:  [phone]  Name: Deante Blough. MRN: [id number] Date of Birth: [birth year]    Allyson Sabal, PT 01/02/2016 4:17 PM

## 2016-01-02 NOTE — Patient Instructions (Signed)
How to go up and down stairs: When going up the stairs step with your RIGHT foot and left cane or hand rail - advance cane or hand on hand rail PRIOR to stepping up with your RIGHT foot  When stepping down the steps, step with your RIGHT foot and left cane or hand rail - advance cane or hand rail PRIOR to stepping with your RIGHT foot  ** Always step up and down leading with your right foot **  When stepping down a curb, place your LEFT cane down first, then step down with your RIGHT foot followed by your right cane and left foot When stepping up a curb, place your LEFT cane up first, then step up with your RIGHT foot followed by your right cane and left foot  When walking move left cane and right foot together and right cane and left foot together

## 2016-01-30 ENCOUNTER — Ambulatory Visit (HOSPITAL_BASED_OUTPATIENT_CLINIC_OR_DEPARTMENT_OTHER): Payer: Medicare Other | Admitting: Nurse Practitioner

## 2016-01-30 ENCOUNTER — Other Ambulatory Visit (HOSPITAL_BASED_OUTPATIENT_CLINIC_OR_DEPARTMENT_OTHER): Payer: Medicare Other

## 2016-01-30 VITALS — BP 102/72 | HR 82 | Temp 98.0°F | Resp 18 | Ht 72.0 in | Wt 159.0 lb

## 2016-01-30 DIAGNOSIS — I4891 Unspecified atrial fibrillation: Secondary | ICD-10-CM | POA: Diagnosis not present

## 2016-01-30 DIAGNOSIS — C8580 Other specified types of non-Hodgkin lymphoma, unspecified site: Secondary | ICD-10-CM | POA: Diagnosis not present

## 2016-01-30 LAB — LACTATE DEHYDROGENASE: LDH: 144 U/L (ref 125–245)

## 2016-01-30 LAB — CBC WITH DIFFERENTIAL/PLATELET
BASO%: 0.4 % (ref 0.0–2.0)
BASOS ABS: 0 10*3/uL (ref 0.0–0.1)
EOS%: 2.6 % (ref 0.0–7.0)
Eosinophils Absolute: 0.2 10*3/uL (ref 0.0–0.5)
HCT: 41.2 % (ref 38.4–49.9)
HGB: 14.2 g/dL (ref 13.0–17.1)
LYMPH#: 2.6 10*3/uL (ref 0.9–3.3)
LYMPH%: 36.3 % (ref 14.0–49.0)
MCH: 31.8 pg (ref 27.2–33.4)
MCHC: 34.5 g/dL (ref 32.0–36.0)
MCV: 92.4 fL (ref 79.3–98.0)
MONO#: 1.1 10*3/uL — ABNORMAL HIGH (ref 0.1–0.9)
MONO%: 15 % — AB (ref 0.0–14.0)
NEUT#: 3.3 10*3/uL (ref 1.5–6.5)
NEUT%: 45.7 % (ref 39.0–75.0)
Platelets: 192 10*3/uL (ref 140–400)
RBC: 4.46 10*6/uL (ref 4.20–5.82)
RDW: 14.4 % (ref 11.0–14.6)
WBC: 7.3 10*3/uL (ref 4.0–10.3)

## 2016-01-30 LAB — COMPREHENSIVE METABOLIC PANEL
ALT: 19 U/L (ref 0–55)
AST: 19 U/L (ref 5–34)
Albumin: 3.4 g/dL — ABNORMAL LOW (ref 3.5–5.0)
Alkaline Phosphatase: 74 U/L (ref 40–150)
Anion Gap: 9 mEq/L (ref 3–11)
BUN: 15.7 mg/dL (ref 7.0–26.0)
CALCIUM: 9.1 mg/dL (ref 8.4–10.4)
CHLORIDE: 107 meq/L (ref 98–109)
CO2: 25 meq/L (ref 22–29)
CREATININE: 0.9 mg/dL (ref 0.7–1.3)
EGFR: 85 mL/min/{1.73_m2} — ABNORMAL LOW (ref >90)
GLUCOSE: 89 mg/dL (ref 70–140)
POTASSIUM: 3.7 meq/L (ref 3.5–5.1)
SODIUM: 142 meq/L (ref 136–145)
Total Bilirubin: 0.64 mg/dL (ref 0.20–1.20)
Total Protein: 6.1 g/dL — ABNORMAL LOW (ref 6.4–8.3)

## 2016-01-30 LAB — TECHNOLOGIST REVIEW

## 2016-01-30 NOTE — Progress Notes (Addendum)
[facility] OFFICE PROGRESS NOTE   Diagnosis:  Non-Hodgkin's lymphoma  INTERVAL HISTORY:   Perry Nielsen returns as scheduled. He feels well. He continues to increase his activity. He is exercising including swimming. He reports a good appetite. No weight loss. No fevers or sweats. He continues to note mild "achiness" involving the right lower leg especially at bedtime.  Objective:  Vital signs in last 24 hours:  Blood pressure 102/72, pulse 82, temperature 98 F (36.7 C), temperature source Oral, resp. rate 18, height 6' (1.829 m), weight 159 lb (72.1 kg), SpO2 97 %.    HEENT: No thrush or ulcers. Lymphatics: No palpable cervical, supra clavicular, axillary or inguinal lymph nodes. Resp: Faint inspiratory rales right lung base. Cardio: Regular rate and rhythm. GI: Abdomen soft and nontender. No organomegaly. Vascular: No leg edema. Neuro: Alert and oriented. Mild weakness dorsiflexion right foot.    Lab Results:  Lab Results  Component Value Date   WBC 7.3 01/30/2016   HGB 14.2 01/30/2016   HCT 41.2 01/30/2016   MCV 92.4 01/30/2016   PLT 192 01/30/2016   NEUTROABS 3.3 01/30/2016    Imaging:  No results found.  Medications: I have reviewed the patient's current medications.  Assessment/Plan: 1. 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 History of thrombocytopenia predating chemotherapy 3. T7 compression fracture-likely pathologic 4. 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 Right foot pain-likely secondary to nerve compression from the sacral mass, improved 6. Diffuse erythematous rash, appearance consistent with a "drug" rash-resolved 7. History of low-grade fever likely secondary to atelectasis/postop fever versus tumor fever 8. Hepatitis B core antibody positive 10. Admission 04/18/2015 through 04/23/2015 presenting with fever; amylase and lipase elevated.   Abdominal CT showed inflammation in the left upper quadrant and associated loculated fluid collections status post drainage procedure, fluid with high levels of amylase and lipase; cultures negative; question pancreatitis.   04/26/2015 lipase mildly elevated, amylase normal.   CT abdomen/pelvis 05/14/2015 with a persistent 6.5 x 3.8 cm posterior pancreatic tail fluid collection similar in size to the 04/18/2015 CT study. Additional smaller fluid collections in the far lateral left upper quadrant; extensive patchy groundglass opacity at the lung bases, significantly increased; stable expansile lytic lesion in the upper right sacrum. 11. Bilateral lower extremity DVTs on a Doppler 04/18/2015, now on Lovenox. 12. Severe neutropenia 05/13/2015. Likely delayed effect of Rituxan. Resolved 13. Chest x-ray 05/15/2015 with bilateral mid to lower lung interstitial thickening with some hazy lower lung zone groundglass opacity. Partially improved on the chest x-ray 05/24/2015 14. New-onset rapid atrial fibrillation 05/29/2015 15. Admission with acute onset upper abdomen/low anterior chest pain 07/07/2015-most likely related to Neulasta, spontaneously resolved 16. Altered mental status- Potentially related to persistent  delirium following a prolonged critical illness , depression, dementia, or psychosis. Brain MRI 10/22/2015 with no acute or metastatic intracranial abnormality. Stable appearance of the brain since 2016.  Mental status improved 10/24/2015. He is followed by neurology   Disposition: Perry Nielsen appears well. He remains in clinical remission from non-Hodgkin's lymphoma. He will return for a follow-up visit and LDH in 3 months. He will contact the office in the interim with any problems.  Patient seen with Dr. Benay Spice.    Perry Nielsen ANP/GNP-BC   01/30/2016  12:17 PM  This was a shared visit with Perry Nielsen. Perry Nielsen is in clinical remission from non-Hodgkin's lymphoma. He will return for an office visit in 3 months.  Perry Nielsen, M.D.

## 2016-02-13 ENCOUNTER — Other Ambulatory Visit: Payer: Self-pay | Admitting: Nurse Practitioner

## 2016-02-13 ENCOUNTER — Telehealth: Payer: Self-pay | Admitting: Nurse Practitioner

## 2016-02-13 DIAGNOSIS — C8580 Other specified types of non-Hodgkin lymphoma, unspecified site: Secondary | ICD-10-CM

## 2016-02-13 MED ORDER — HYDROCODONE-ACETAMINOPHEN 5-325 MG PO TABS: 1.0000 | ORAL_TABLET | Freq: Every evening | ORAL | 0 refills | Status: DC | PRN

## 2016-02-13 NOTE — Telephone Encounter (Signed)
I returned Perry Nielsen's call. Perry Nielsen is having right leg pain at nighttime. Advil is no longer effective.The pain is relieved with one hydrocodone tablet. She will pick up a prescription for hydrocodone tomorrow.

## 2016-02-28 ENCOUNTER — Ambulatory Visit (INDEPENDENT_AMBULATORY_CARE_PROVIDER_SITE_OTHER): Payer: Medicare Other | Admitting: Adult Health

## 2016-02-28 ENCOUNTER — Encounter: Payer: Self-pay | Admitting: Adult Health

## 2016-02-28 VITALS — BP 128/64 | Temp 98.6°F | Ht 72.0 in | Wt 158.7 lb

## 2016-02-28 DIAGNOSIS — F411 Generalized anxiety disorder: Secondary | ICD-10-CM

## 2016-02-28 DIAGNOSIS — N529 Male erectile dysfunction, unspecified: Secondary | ICD-10-CM

## 2016-02-28 MED ORDER — LORAZEPAM 0.5 MG PO TABS: 0.5000 mg | ORAL_TABLET | Freq: Two times a day (BID) | ORAL | 0 refills | Status: DC | PRN

## 2016-02-28 NOTE — Progress Notes (Signed)
Subjective:    Patient ID: Perry Nielsen., male    DOB: 1940/12/26, 75 y.o.   MRN: [id number]  HPI  75 year old male who presents to the office today for medication refill - anxiety. He has been taking this medication once daily unless he has a lot of anxiety and then he will take two pills.    His only other complaint is that of ED. The last time I saw him we switched from Viagra to Cialis. He reports that he has not noticed any difference with his ED. He reports that " it just doesn't stay hard as long as I want it."   Over all, he is feeling well. He is still in remission from lymphoma and is walking without a cane most of the time.   Review of Systems  Constitutional: Negative.   HENT: Negative.   Eyes: Negative.   Respiratory: Negative.   Cardiovascular: Negative.   Gastrointestinal: Negative.   Skin: Negative.   Neurological: Negative.   Hematological: Negative.   All other systems reviewed and are negative.  Past Medical History:  Diagnosis Date  . ALLERGIC RHINITIS 08/03/2007  . BACK PAIN 09/18/2009  . 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 CAROTID BRUIT, LEFT 08/07/2008  . 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 Foot drop    rt foot  . GERD 02/18/2007  . HAMMER TOE 08/03/2007  . 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 KNEE PAIN, LEFT, CHRONIC 08/07/2008  . Lymphoma ([place])   . Rash, skin    torso  . Skin cancer   . Sleeping difficulty     Social History   Social History  . Marital status: Married    Spouse name: N/A  . Number of children: N/A  . Years of education: N/A   Occupational History  . retired     Pharmacist, hospital, coaching   Social History Main Topics  . Smoking status: Never Smoker  . Smokeless tobacco: Not on file  . Alcohol use No     Comment: rarely   . Drug use: No  . Sexual activity: Not on file   Other Topics Concern  . Not on file   Social History Narrative   Married.   Very active.      Past Surgical History:  Procedure Laterality Date  . CATARACT EXTRACTION W/ INTRAOCULAR LENS  IMPLANT, BILATERAL Bilateral   . KNEE ARTHROSCOPY     left  . 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 SHOULDER OPEN ROTATOR CUFF REPAIR  07/06/05   right  . TONSILLECTOMY AND ADENOIDECTOMY    . TRANSURETHRAL RESECTION OF PROSTATE      Family History  Problem Relation Age of Onset  . Heart attack Other   . Aneurysm Other     aortic  . Coronary artery disease Sister   . Prostate cancer Father   . Pulmonary fibrosis Mother   . Uterine cancer Mother     Allergies  Allergen Reactions  . Antihistamines, Diphenhydramine-Type     CAUSES PROSTATE TO SWELL & as a child had hyperactivity when took Benadryl Pt took Pepcid w/ Benadryl PO and did fine per Pt & his wife    Current Outpatient Prescriptions on File Prior to Visit  Medication Sig Dispense Refill  .  famotidine (PEPCID) 20 MG tablet Take 20 mg by mouth daily.     Marland Kitchen HYDROcodone-acetaminophen (NORCO) 5-325 MG tablet Take 1 tablet by mouth at bedtime as needed for moderate pain. 30 tablet 0  . ibuprofen (ADVIL,MOTRIN) 200 MG tablet Take 400 mg by mouth every 4 (four) hours as needed for fever, headache, mild pain, moderate pain or cramping.    . meloxicam (MOBIC) 15 MG tablet Take 15 mg by mouth daily.    Marland Kitchen oxymetazoline (AFRIN) 0.05 % nasal spray Place 1 spray into both nostrils 2 (two) times daily as needed for congestion.    Vladimir Faster Glycol-Propyl Glycol (SYSTANE OP) Apply 1 drop to eye daily as needed (dryness).    . polyethylene glycol (MIRALAX / GLYCOLAX) packet Take 17 g by mouth daily as needed. 14 each 0  . tadalafil (CIALIS) 20 MG tablet Take 0.5-1 tablets (10-20 mg total) by mouth every other day as needed for erectile dysfunction. 20 tablet 11   No current facility-administered  medications on file prior to visit.     BP 128/64   Temp 98.6 F (37 C) (Oral)   Ht 6' (1.829 m)   Wt 158 lb 11.2 oz (72 kg)   BMI 21.52 kg/m       Objective:   Physical Exam  Constitutional: He is oriented to person, place, and time. He appears well-developed and well-nourished. No distress.  Cardiovascular: Normal rate, regular rhythm, normal heart sounds and intact distal pulses.  Exam reveals no gallop and no friction rub.   No murmur heard. Pulmonary/Chest: Effort normal and breath sounds normal. No respiratory distress. He has no wheezes. He has no rales. He exhibits no tenderness.  Neurological: He is alert and oriented to person, place, and time.  Skin: Skin is warm and dry. No rash noted. He is not diaphoretic. No erythema. No pallor.  Psychiatric: He has a normal mood and affect. His behavior is normal. Judgment and thought content normal.  Nursing note and vitals reviewed.     Assessment & Plan:  1. Anxiety state - LORazepam (ATIVAN) 0.5 MG tablet; Take 1 tablet (0.5 mg total) by mouth 2 (two) times daily as needed. for anxiety  Dispense: 60 tablet; Refill: 0 - Follow up with Dr. Sherren Mocha for any additional refills.   2. Erectile dysfunction, unspecified erectile dysfunction type - He does not want to see Urology at this time  Dorothyann Peng, NP

## 2016-02-28 NOTE — Patient Instructions (Signed)
It was great seeing you again.   Please follow up with Dr. Sherren Mocha for any additional refills.

## 2016-03-10 ENCOUNTER — Encounter: Payer: Self-pay | Admitting: Gastroenterology

## 2016-03-13 ENCOUNTER — Other Ambulatory Visit: Payer: Self-pay | Admitting: *Deleted

## 2016-03-13 ENCOUNTER — Telehealth: Payer: Self-pay | Admitting: *Deleted

## 2016-03-13 DIAGNOSIS — C8580 Other specified types of non-Hodgkin lymphoma, unspecified site: Secondary | ICD-10-CM

## 2016-03-13 MED ORDER — HYDROCODONE-ACETAMINOPHEN 5-325 MG PO TABS: 1.0000 | ORAL_TABLET | Freq: Every evening | ORAL | 0 refills | Status: DC | PRN

## 2016-03-13 NOTE — Telephone Encounter (Signed)
Received call from pt's wife asking for refill on hydrocodone.  She states they have some left but with the upcoming weather would like to get refilled. Message to Dr. Ricci Barker RN

## 2016-03-13 NOTE — Telephone Encounter (Signed)
Prescription refilled per Dr. Benay Spice and pt.'s wife notified that prescription is ready for pick up by 4:00PM today.

## 2016-03-31 ENCOUNTER — Telehealth: Payer: Self-pay

## 2016-04-01 ENCOUNTER — Ambulatory Visit (INDEPENDENT_AMBULATORY_CARE_PROVIDER_SITE_OTHER): Payer: Medicare Other | Admitting: Family Medicine

## 2016-04-01 ENCOUNTER — Encounter: Payer: Self-pay | Admitting: Family Medicine

## 2016-04-01 VITALS — BP 114/80 | HR 94 | Temp 98.0°F | Wt 154.9 lb

## 2016-04-01 DIAGNOSIS — N401 Enlarged prostate with lower urinary tract symptoms: Secondary | ICD-10-CM | POA: Diagnosis not present

## 2016-04-01 DIAGNOSIS — C8580 Other specified types of non-Hodgkin lymphoma, unspecified site: Secondary | ICD-10-CM

## 2016-04-01 DIAGNOSIS — E440 Moderate protein-calorie malnutrition: Secondary | ICD-10-CM

## 2016-04-01 DIAGNOSIS — F411 Generalized anxiety disorder: Secondary | ICD-10-CM

## 2016-04-01 DIAGNOSIS — F528 Other sexual dysfunction not due to a substance or known physiological condition: Secondary | ICD-10-CM | POA: Diagnosis not present

## 2016-04-01 DIAGNOSIS — M21371 Foot drop, right foot: Secondary | ICD-10-CM

## 2016-04-01 DIAGNOSIS — R351 Nocturia: Secondary | ICD-10-CM | POA: Diagnosis not present

## 2016-04-01 DIAGNOSIS — Z23 Encounter for immunization: Secondary | ICD-10-CM | POA: Diagnosis not present

## 2016-04-01 LAB — TSH: TSH: 4.31 u[IU]/mL (ref 0.35–4.50)

## 2016-04-01 LAB — PSA: PSA: 1.08 ng/mL (ref 0.10–4.00)

## 2016-04-01 MED ORDER — GABAPENTIN 100 MG PO CAPS: 100.0000 mg | ORAL_CAPSULE | Freq: Three times a day (TID) | ORAL | 3 refills | Status: DC

## 2016-04-01 MED ORDER — LORAZEPAM 0.5 MG PO TABS: 0.5000 mg | ORAL_TABLET | Freq: Two times a day (BID) | ORAL | 5 refills | Status: DC | PRN

## 2016-04-01 MED ORDER — VARDENAFIL HCL 20 MG PO TABS: 20.0000 mg | ORAL_TABLET | Freq: Every day | ORAL | 6 refills | Status: DC | PRN

## 2016-04-01 MED ORDER — SILDENAFIL CITRATE 20 MG PO TABS: 20.0000 mg | ORAL_TABLET | Freq: Every evening | ORAL | 11 refills | Status: DC | PRN

## 2016-04-01 NOTE — Progress Notes (Signed)
Perry Nielsen is a 75 year old married male nonsmoker who comes in today for an office visit to discuss many issues  His noticed in his chart he presented with severe back pain was found to have a lymphoma. He underwent chemotherapy his last treatment was 4 months ago. Except for the foot drop right lower extremity he feels pretty much back to normal. He says he is very weak. He start an exercise program swimming 3 days a week [facility] Tuesdays and Thursdays. He continues to take the pain pills because of the pain in his right leg and the neuropathy. He says sometimes will take 2 at bedtime. He wonders if there is another alternative to the narcotics. I recommend we try Neurontin.  He takes Ativan 0.5 once or twice daily for anxiety and sleep dysfunction.  He still requires a MiraLAX [name] on the narcotics.  He's tried Viagra and Cialis but doesn't achieve a good erection. Will discuss options.  He's a needle flu shot and a tetanus booster. Also Pneumovax.  Physical examination vital signs stable he is afebrile  Impression  #1 lymphoma currently in remission  #2 erectile dysfunction,,,,,,,,,,,,, trial of Levitra  #3 neuropathy and pain......... trial of Neurontin follow-up in 4 weeks

## 2016-04-01 NOTE — Patient Instructions (Signed)
Neurontin 100 mg............ one twice daily......Marland Kitchen return in the first week in December for follow-up  Continue other medications  Labs today  Trial of Levitra..............Marland Kitchen 1 tablet 3 hours prior to sex..............Marland Kitchen or the generic Viagra.........Marland Kitchen 1 or 2 tabs 3 hours prior to sex

## 2016-04-01 NOTE — Progress Notes (Signed)
Pre visit review using our clinic review tool, if applicable. No additional management support is needed unless otherwise documented below in the visit note. 

## 2016-04-17 ENCOUNTER — Other Ambulatory Visit: Payer: Self-pay | Admitting: *Deleted

## 2016-04-17 DIAGNOSIS — C8580 Other specified types of non-Hodgkin lymphoma, unspecified site: Secondary | ICD-10-CM

## 2016-04-17 MED ORDER — HYDROCODONE-ACETAMINOPHEN 5-325 MG PO TABS: 1.0000 | ORAL_TABLET | Freq: Every evening | ORAL | 0 refills | Status: DC | PRN

## 2016-04-17 NOTE — Telephone Encounter (Signed)
Call from pt's wife requesting refill on Hydrocodone. Pt has pain in his leg and foot "from foot drop." He takes 1-2 Hydrocodone tablets per day when needed.  Pain gets up to 7/10 and down to 2/10 with medication. Reviewed with Dr. Benay Spice. Prescription refilled, per MD.

## 2016-04-20 ENCOUNTER — Telehealth: Payer: Self-pay | Admitting: *Deleted

## 2016-04-20 NOTE — Telephone Encounter (Signed)
-----   Message from Dorena Cookey, MD sent at 04/06/2016  8:43 AM EDT ----- Please call labs normal

## 2016-04-20 NOTE — Telephone Encounter (Signed)
Called and spoke with pt's wife who is verified on DPR. Informing her that Perry Nielsen labs were normal. [name] wife verbalized understanding.

## 2016-04-30 ENCOUNTER — Ambulatory Visit: Payer: Self-pay | Admitting: Oncology

## 2016-04-30 ENCOUNTER — Other Ambulatory Visit: Payer: Self-pay

## 2016-05-11 ENCOUNTER — Other Ambulatory Visit: Payer: Self-pay | Admitting: *Deleted

## 2016-05-11 ENCOUNTER — Telehealth: Payer: Self-pay | Admitting: Medical Oncology

## 2016-05-11 DIAGNOSIS — C8580 Other specified types of non-Hodgkin lymphoma, unspecified site: Secondary | ICD-10-CM

## 2016-05-11 MED ORDER — HYDROCODONE-ACETAMINOPHEN 5-325 MG PO TABS: 1.0000 | ORAL_TABLET | Freq: Every evening | ORAL | 0 refills | Status: DC | PRN

## 2016-05-11 NOTE — Telephone Encounter (Signed)
Requests refill

## 2016-05-11 NOTE — Telephone Encounter (Signed)
Hydrocodone prescription signed by Dr. Benay Spice and patient notified that prescription is ready for pick up.

## 2016-05-19 ENCOUNTER — Ambulatory Visit (INDEPENDENT_AMBULATORY_CARE_PROVIDER_SITE_OTHER): Payer: Medicare Other | Admitting: Gastroenterology

## 2016-05-19 ENCOUNTER — Encounter: Payer: Self-pay | Admitting: Gastroenterology

## 2016-05-19 ENCOUNTER — Encounter (INDEPENDENT_AMBULATORY_CARE_PROVIDER_SITE_OTHER): Payer: Self-pay

## 2016-05-19 VITALS — BP 116/76 | HR 78 | Ht 69.0 in | Wt 159.3 lb

## 2016-05-19 DIAGNOSIS — Z86010 Personal history of colonic polyps: Secondary | ICD-10-CM | POA: Diagnosis not present

## 2016-05-19 MED ORDER — NA SULFATE-K SULFATE-MG SULF 17.5-3.13-1.6 GM/177ML PO SOLN: 1.0000 | Freq: Once | ORAL | 0 refills | Status: AC

## 2016-05-19 NOTE — Patient Instructions (Signed)
You have been scheduled for a colonoscopy. Please follow written instructions given to you at your visit today.  Please pick up your prep supplies at the pharmacy within the next 1-3 days. If you use inhalers (even only as needed), please bring them with you on the day of your procedure. Your physician has requested that you go to www.startemmi.com and enter the access code given to you at your visit today. This web site gives a general overview about your procedure. However, you should still follow specific instructions given to you by our office regarding your preparation for the procedure.  If you are age 57 or older, your body mass index should be between 23-30. Your Body mass index is 23.53 kg/m. If this is out of the aforementioned range listed, please consider follow up with your Primary Care Provider.  If you are age 34 or younger, your body mass index should be between 19-25. Your Body mass index is 23.53 kg/m. If this is out of the aformentioned range listed, please consider follow up with your Primary Care Provider.   Thank you for choosing [facility]  Dr Wilfrid Lund III

## 2016-05-19 NOTE — Progress Notes (Signed)
Perry Nielsen Consult Note:  History: Perry Nielsen. 05/19/2016  Referring physician: Joycelyn Man, MD  Reason for consult/chief complaint: Colonoscopy (march 2012); Constipation (on and off); and Diarrhea (on an off)   Subjective  HPI:  Surveillance colonoscopy 09/2010 colon - 28m polyp - no pathology available, but KDeatra Inaletter says return in 5 years.  ROS:  Review of Systems Denies Cp or dsypnea  Has chronic left foot drop since CTX  Past Medical History: Past Medical History:  Diagnosis Date  . ALLERGIC RHINITIS 08/03/2007  . BACK PAIN 09/18/2009  . BPH associated with nocturia 02/18/2007   Qualifier: Diagnosis of  By: ATiney RougeCMA, LEllison Hughs   . CAROTID BRUIT, LEFT 08/07/2008  . 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 Foot drop    rt foot  . GERD 02/18/2007  . HAMMER TOE 08/03/2007  . 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 KNEE PAIN, LEFT, CHRONIC 08/07/2008  . [place]    right hip  . Rash, skin    torso  . Skin cancer   . Sleeping difficulty    I also reviewed the extensive history outlined in the 01/30/16 Oncology office note  A fib began one year ago.  [place]  Sounds like it was brief.   Then he was on Xarelto for DVT, stopped that in April, 2017   Past Surgical History: Past Surgical History:  Procedure Laterality Date  . CATARACT EXTRACTION W/ INTRAOCULAR LENS  IMPLANT, BILATERAL Bilateral   . KNEE ARTHROSCOPY     left  . 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   Procedure: LAPAROSCOPIC SPLENECTOMY;  Surgeon: BExcell Seltzer MD;  Location: [facility];  Service: General;  Laterality: N/A;  . SHOULDER OPEN ROTATOR CUFF REPAIR  07/06/05   right  . TONSILLECTOMY AND ADENOIDECTOMY    . TRANSURETHRAL RESECTION OF PROSTATE       Family History: Family History  Problem Relation Age of Onset  . Pulmonary fibrosis Mother   . Uterine cancer Mother   . Prostate cancer Father   . Heart attack  Other   . Aneurysm Other     aortic  . Coronary artery disease Sister     Social History: Social History   Social History  . Marital status: Married    Spouse name: N/A  . Number of children: 2  . Years of education: N/A   Occupational History  . 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 Smoking status: Never Smoker  . Smokeless tobacco: Never Used  . Alcohol use No     Comment: rarely   . Drug use: No  . Sexual activity: Not Asked   Other Topics Concern  . None   Social History Narrative   Married.  Very active.     He swims a mile several days a week  Allergies: Allergies  Allergen Reactions  . Antihistamines, Diphenhydramine-Type     CAUSES PROSTATE TO SWELL & as a child had hyperactivity when took Benadryl Pt took Pepcid w/ Benadryl PO and did fine per Pt & his wife    Outpatient Meds: Current Outpatient Prescriptions  Medication Sig Dispense Refill  . famotidine (PEPCID) 20 MG tablet Take 20 mg by mouth daily.     .Marland Kitchengabapentin (NEURONTIN) 100 MG capsule Take 1 capsule (100 mg total) by mouth 3 (three) times daily. 90 capsule 3  . HYDROcodone-acetaminophen (NORCO) 5-325 MG tablet Take  1 tablet by mouth at bedtime as needed for moderate pain. 30 tablet 0  . ibuprofen (ADVIL,MOTRIN) 200 MG tablet Take 400 mg by mouth every 4 (four) hours as needed for fever, headache, mild pain, moderate pain or cramping.    Marland Kitchen LORazepam (ATIVAN) 0.5 MG tablet Take 1 tablet (0.5 mg total) by mouth 2 (two) times daily as needed. for anxiety 60 tablet 5  . oxymetazoline (AFRIN) 0.05 % nasal spray Place 1 spray into both nostrils 2 (two) times daily as needed for congestion.    Vladimir Faster Glycol-Propyl Glycol (SYSTANE OP) Apply 1 drop to eye daily as needed (dryness).    . polyethylene glycol (MIRALAX / GLYCOLAX) packet Take 17 g by mouth daily as needed. 14 each 0  . vardenafil (LEVITRA) 20 MG tablet Take 20 mg by mouth daily as needed for erectile  dysfunction.    . Na Sulfate-K Sulfate-Mg Sulf 17.5-3.13-1.6 GM/180ML SOLN Take 1 kit by mouth once. 354 mL 0   No current facility-administered medications for this visit.     Has not been on Xarelto since April 2017  ___________________________________________________________________ Objective   Exam:  BP 116/76   Pulse 78   Ht '5\' 9"'  (1.753 m) Comment: measured without shoes  Wt 159 lb 5 oz (72.3 kg)   BMI 23.53 kg/m    General: this is a(n) well-appearing   CV: RRR without murmur, S1/S2, no JVD, no peripheral edema  Resp: clear to auscultation bilaterally, normal RR and effort noted  GI: soft, no tenderness, with active bowel sounds. No guarding or palpable organomegaly noted.  Skin; warm and dry, no rash or jaundice noted    Assessment: Encounter Diagnosis  Name Primary?  Marland Kitchen Hx of colonic polyps Yes    Plan:  Colonoscopy  Thank you for the courtesy of this consult.  Please call me with any questions or concerns.  Nelida Meuse III  CC: Joycelyn Man, MD

## 2016-05-26 ENCOUNTER — Telehealth: Payer: Self-pay | Admitting: Oncology

## 2016-05-26 ENCOUNTER — Ambulatory Visit (HOSPITAL_BASED_OUTPATIENT_CLINIC_OR_DEPARTMENT_OTHER): Payer: Medicare Other | Admitting: Oncology

## 2016-05-26 ENCOUNTER — Other Ambulatory Visit (HOSPITAL_BASED_OUTPATIENT_CLINIC_OR_DEPARTMENT_OTHER): Payer: Medicare Other

## 2016-05-26 VITALS — BP 121/80 | HR 91 | Temp 97.7°F | Resp 18 | Ht 69.0 in | Wt 160.0 lb

## 2016-05-26 DIAGNOSIS — C8580 Other specified types of non-Hodgkin lymphoma, unspecified site: Secondary | ICD-10-CM

## 2016-05-26 DIAGNOSIS — Z23 Encounter for immunization: Secondary | ICD-10-CM

## 2016-05-26 LAB — CBC WITH DIFFERENTIAL/PLATELET
BASO%: 1.2 % (ref 0.0–2.0)
Basophils Absolute: 0.1 10*3/uL (ref 0.0–0.1)
EOS%: 1.7 % (ref 0.0–7.0)
Eosinophils Absolute: 0.2 10*3/uL (ref 0.0–0.5)
HEMATOCRIT: 44.7 % (ref 38.4–49.9)
HGB: 14.4 g/dL (ref 13.0–17.1)
LYMPH#: 2.9 10*3/uL (ref 0.9–3.3)
LYMPH%: 27 % (ref 14.0–49.0)
MCH: 31.3 pg (ref 27.2–33.4)
MCHC: 32.2 g/dL (ref 32.0–36.0)
MCV: 97.2 fL (ref 79.3–98.0)
MONO#: 1.4 10*3/uL — AB (ref 0.1–0.9)
MONO%: 13.4 % (ref 0.0–14.0)
NEUT%: 56.7 % (ref 39.0–75.0)
NEUTROS ABS: 6 10*3/uL (ref 1.5–6.5)
PLATELETS: 211 10*3/uL (ref 140–400)
RBC: 4.6 10*6/uL (ref 4.20–5.82)
RDW: 14 % (ref 11.0–14.6)
WBC: 10.6 10*3/uL — AB (ref 4.0–10.3)

## 2016-05-26 LAB — LACTATE DEHYDROGENASE: LDH: 153 U/L (ref 125–245)

## 2016-05-26 MED ORDER — PNEUMOCOCCAL VAC POLYVALENT 25 MCG/0.5ML IJ INJ
0.5000 mL | INJECTION | Freq: Once | INTRAMUSCULAR | Status: AC
  Administered 2016-05-26: 0.5 mL via INTRAMUSCULAR
  Filled 2016-05-26: qty 0.5

## 2016-05-26 NOTE — Progress Notes (Signed)
[facility] OFFICE PROGRESS NOTE   Diagnosis: Non-Hodgkin's lymphoma  INTERVAL HISTORY:   Perry Nielsen returns as scheduled. Good appetite. He is swimming 3 days per week. He continues to have weakness and pain of the right foot. No other site of pain. He takes hydrocodone at night. He has regained partial use of the right foot.  Objective:  Vital signs in last 24 hours:  Blood pressure 121/80, pulse 91, temperature 97.7 F (36.5 C), temperature source Oral, resp. rate 18, height '5\' 9"'  (1.753 m), weight 160 lb (72.6 kg), SpO2 98 %.    HEENT: Neck without mass, mild left conjunctival erythema Lymphatics: No cervical, supraclavicular, or inguinal nodes. 0.5 cm mobile bilateral axillary nodes versus prominent fat pads. Resp: Lungs clear bilaterally Cardio: Regular rate and rhythm GI: No hepatomegaly, no mass, nontender Vascular: No leg edema Neuro: 3+/5 strength with dorsi flexion at the right foot, the right leg strength appears intact   Lab Results:  Lab Results  Component Value Date   WBC 10.6 (H) 05/26/2016   HGB 14.4 05/26/2016   HCT 44.7 05/26/2016   MCV 97.2 05/26/2016   PLT 211 05/26/2016   NEUTROABS 6.0 05/26/2016    LDH 153  Medications: I have reviewed the patient's current medications.  Assessment/Plan: 1. 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 History of thrombocytopenia predating chemotherapy 3. T7 compression fracture-likely pathologic 4. 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 Right foot pain-likely secondary to nerve compression from the sacral mass, improved 6. Diffuse erythematous rash, appearance consistent with a "drug" rash-resolved 7. History of low-grade fever likely secondary to atelectasis/postop fever versus tumor fever 8. Hepatitis B core antibody positive 10. Admission 04/18/2015 through 04/23/2015 presenting with fever; amylase and lipase elevated.   Abdominal CT showed inflammation in the left upper quadrant and associated loculated fluid collections status post drainage procedure, fluid with high levels of amylase and lipase; cultures negative; question pancreatitis.   04/26/2015 lipase mildly elevated, amylase normal.   CT abdomen/pelvis 05/14/2015 with a persistent 6.5 x 3.8 cm posterior pancreatic tail fluid collection similar in size to the 04/18/2015 CT study. Additional smaller fluid collections in the far lateral left upper quadrant; extensive patchy groundglass opacity at the lung bases, significantly increased; stable expansile lytic lesion in the upper right sacrum. 11. 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 Severe neutropenia 05/13/2015. Likely delayed effect of Rituxan. Resolved 13. Chest x-ray 05/15/2015 with bilateral mid to lower lung interstitial thickening with some hazy lower lung zone groundglass opacity. Partially improved on the chest x-ray 05/24/2015 14. New-onset rapid atrial fibrillation 05/29/2015 15. Admission with acute onset upper abdomen/low anterior chest pain 07/07/2015-most likely related to Neulasta, spontaneously resolved 16. Altered mental status- Potentially related to  persistent delirium following a prolonged critical illness , depression, dementia, or psychosis. Brain MRI 10/22/2015 with no acute or metastatic intracranial abnormality. Stable appearance  of the brain since 2016. Mental status improved 10/24/2015. He is followed by neurology     Disposition:  Perry Nielsen is in clinical remission from non-Hodgkin's lymphoma. He received a 23 valent pneumococcal vaccine today. He will return for an office visit in 4 months.  Betsy Coder, MD  05/26/2016  3:27 PM

## 2016-05-26 NOTE — Telephone Encounter (Signed)
Appointments scheduled per 05/26/16 los. A copy of the  AVS report and appointment schedule was given to patient,per 05/26/16 los. ° °

## 2016-06-03 ENCOUNTER — Telehealth: Payer: Self-pay | Admitting: *Deleted

## 2016-06-03 ENCOUNTER — Other Ambulatory Visit: Payer: Self-pay | Admitting: *Deleted

## 2016-06-03 DIAGNOSIS — C8580 Other specified types of non-Hodgkin lymphoma, unspecified site: Secondary | ICD-10-CM

## 2016-06-03 MED ORDER — HYDROCODONE-ACETAMINOPHEN 5-325 MG PO TABS: 1.0000 | ORAL_TABLET | Freq: Every evening | ORAL | 0 refills | Status: DC | PRN

## 2016-06-03 NOTE — Telephone Encounter (Signed)
Left message on voicemail informing pt's wife that prescription will up front for pick up.

## 2016-06-03 NOTE — Telephone Encounter (Signed)
Wife states pt needs refill on Hydrocodone.  He takes one tablet and sometimes two every night for pain.  Please call her when Rx is ready to pick up.

## 2016-06-17 ENCOUNTER — Encounter: Payer: Self-pay | Admitting: Gastroenterology

## 2016-06-17 ENCOUNTER — Ambulatory Visit (AMBULATORY_SURGERY_CENTER): Payer: Medicare Other | Admitting: Gastroenterology

## 2016-06-17 VITALS — BP 105/71 | HR 64 | Temp 98.0°F | Resp 12 | Ht 71.5 in | Wt 159.0 lb

## 2016-06-17 DIAGNOSIS — Z86010 Personal history of colonic polyps: Secondary | ICD-10-CM

## 2016-06-17 MED ORDER — SODIUM CHLORIDE 0.9 % IV SOLN: 500.0000 mL | INTRAVENOUS | Status: DC

## 2016-06-17 NOTE — Progress Notes (Signed)
Spontaneous respirations throughout. VSS. Resting comfortably. To PACU on room air. Report to  RN. 

## 2016-06-17 NOTE — Op Note (Signed)
[facility] Patient Name: Perry Nielsen Procedure Date: 06/17/2016 1:31 PM MRN: [id number] Endoscopist: Mallie Mussel L. Loletha Carrow , MD Age: 75 Referring MD:  Date of Birth: 11-11-1940 Gender: Male Account #: [id number] Procedure:                Colonoscopy Indications:              Surveillance: Personal history of adenomatous                            polyps on last colonoscopy 5 years ago Medicines:                Monitored Anesthesia Care Procedure:                Pre-Anesthesia Assessment:                           - Prior to the procedure, a History and Physical                            was performed, and patient medications and                            allergies were reviewed. The patient's tolerance of                            previous anesthesia was also reviewed. The risks                            and benefits of the procedure and the sedation                            options and risks were discussed with the patient.                            All questions were answered, and informed consent                            was obtained. Prior Anticoagulants: The patient has                            taken no previous anticoagulant or antiplatelet                            agents. ASA Grade Assessment: II - A patient with                            mild systemic disease. After reviewing the risks                            and benefits, the patient was deemed in                            satisfactory condition to undergo the procedure.  After obtaining informed consent, the colonoscope                            was passed under direct vision. Throughout the                            procedure, the patient's blood pressure, pulse, and                            oxygen saturations were monitored continuously. The                            Model CF-HQ190L [id number]) scope was introduced                            through the anus and  advanced to the the cecum,                            identified by appendiceal orifice and ileocecal                            valve. The colonoscopy was performed without                            difficulty. The patient tolerated the procedure                            well. The quality of the bowel preparation was                            excellent. The ileocecal valve, appendiceal                            orifice, and rectum were photographed. The bowel                            preparation used was SUPREP. The quality of the                            bowel preparation was evaluated using the BBPS                            [facility] Bowel Preparation Scale) with scores of:                            Right Colon = 2, Transverse Colon = 3 and Left                            Colon = 3. The total BBPS score equals 8. Scope In: 1:37:52 PM Scope Out: 1:50:23 PM Scope Withdrawal Time: 0 hours 8 minutes 49 seconds  Total Procedure Duration: 0 hours 12 minutes 31 seconds  Findings:                 The perianal  and digital rectal examinations were                            normal.                           The entire examined colon appeared normal on direct                            and retroflexion views. Complications:            No immediate complications. Estimated Blood Loss:     Estimated blood loss: none. Impression:               - The entire examined colon is normal on direct and                            retroflexion views.                           - No specimens collected. Recommendation:           - Patient has a contact number available for                            emergencies. The signs and symptoms of potential                            delayed complications were discussed with the                            patient. Return to normal activities tomorrow.                            Written discharge instructions were provided to the                             patient.                           - Resume previous diet.                           - Continue present medications.                           - No recommendation at this time regarding repeat                            colonoscopy due to age. Adien Kimmel L. Loletha Carrow, MD 06/17/2016 1:54:59 PM This report has been signed electronically.

## 2016-06-17 NOTE — Patient Instructions (Signed)
YOU HAD AN ENDOSCOPIC PROCEDURE TODAY AT THE [facility]:   Refer to the procedure report that was given to you for any specific questions about what was found during the examination.  If the procedure report does not answer your questions, please call your gastroenterologist to clarify.  If you requested that your care partner not be given the details of your procedure findings, then the procedure report has been included in a sealed envelope for you to review at your convenience later.  YOU SHOULD EXPECT: Some feelings of bloating in the abdomen. Passage of more gas than usual.  Walking can help get rid of the air that was put into your GI tract during the procedure and reduce the bloating. If you had a lower endoscopy (such as a colonoscopy or flexible sigmoidoscopy) you may notice spotting of blood in your stool or on the toilet paper. If you underwent a bowel prep for your procedure, you may not have a normal bowel movement for a few days.  Please Note:  You might notice some irritation and congestion in your nose or some drainage.  This is from the oxygen used during your procedure.  There is no need for concern and it should clear up in a day or so.  SYMPTOMS TO REPORT IMMEDIATELY:   Following lower endoscopy (colonoscopy or flexible sigmoidoscopy):  Excessive amounts of blood in the stool  Significant tenderness or worsening of abdominal pains  Swelling of the abdomen that is new, acute  Fever of 100F or higher   For urgent or emergent issues, a gastroenterologist can be reached at any hour by calling [phone].   DIET:  We do recommend a small meal at first, but then you may proceed to your regular diet.  Drink plenty of fluids but you should avoid alcoholic beverages for 24 hours.  ACTIVITY:  You should plan to take it easy for the rest of today and you should NOT DRIVE or use heavy machinery until tomorrow (because of the sedation medicines used during the test).     FOLLOW UP: Our staff will call the number listed on your records the next business day following your procedure to check on you and address any questions or concerns that you may have regarding the information given to you following your procedure. If we do not reach you, we will leave a message.  However, if you are feeling well and you are not experiencing any problems, there is no need to return our call.  We will assume that you have returned to your regular daily activities without incident.  If any biopsies were taken you will be contacted by phone or by letter within the next 1-3 weeks.  Please call us at [phone] if you have not heard about the biopsies in 3 weeks.    SIGNATURES/CONFIDENTIALITY: You and/or your care partner have signed paperwork which will be entered into your electronic medical record.  These signatures attest to the fact that that the information above on your After Visit Summary has been reviewed and is understood.  Full responsibility of the confidentiality of this discharge information lies with you and/or your care-partner.   Resume medications.  

## 2016-06-18 ENCOUNTER — Telehealth: Payer: Self-pay

## 2016-06-18 NOTE — Telephone Encounter (Signed)
  Follow up Call-  Call back number 06/17/2016  Post procedure Call Back phone  # [phone]  Permission to leave phone message Yes  Some recent data might be hidden    Patient was called for follow up after his procedure on 06/17/2016. No answer at the number given for follow up phone call. A message was left on the answering machine.

## 2016-06-18 NOTE — Telephone Encounter (Signed)
  Follow up Call-  Call back number 06/17/2016  Post procedure Call Back phone  # [phone]  Permission to leave phone message Yes  Some recent data might be hidden     Patient questions:  Do you have a fever, pain , or abdominal swelling? No. Pain Score  0 *  Have you tolerated food without any problems? Yes.    Have you been able to return to your normal activities? Yes.    Do you have any questions about your discharge instructions: Diet   No. Medications  No. Follow up visit  No.  Do you have questions or concerns about your Care? No.  Actions: * If pain score is 4 or above: No action needed, pain <4.

## 2016-06-24 ENCOUNTER — Ambulatory Visit (INDEPENDENT_AMBULATORY_CARE_PROVIDER_SITE_OTHER): Payer: Medicare Other | Admitting: Family Medicine

## 2016-06-24 ENCOUNTER — Encounter: Payer: Self-pay | Admitting: Family Medicine

## 2016-06-24 VITALS — BP 118/82 | HR 77 | Temp 97.8°F | Ht 71.0 in | Wt 159.8 lb

## 2016-06-24 DIAGNOSIS — F528 Other sexual dysfunction not due to a substance or known physiological condition: Secondary | ICD-10-CM | POA: Diagnosis not present

## 2016-06-24 DIAGNOSIS — R351 Nocturia: Secondary | ICD-10-CM

## 2016-06-24 DIAGNOSIS — G4720 Circadian rhythm sleep disorder, unspecified type: Secondary | ICD-10-CM | POA: Diagnosis not present

## 2016-06-24 DIAGNOSIS — N401 Enlarged prostate with lower urinary tract symptoms: Secondary | ICD-10-CM

## 2016-06-24 DIAGNOSIS — F411 Generalized anxiety disorder: Secondary | ICD-10-CM

## 2016-06-24 DIAGNOSIS — C8580 Other specified types of non-Hodgkin lymphoma, unspecified site: Secondary | ICD-10-CM | POA: Diagnosis not present

## 2016-06-24 MED ORDER — GABAPENTIN 100 MG PO CAPS: 100.0000 mg | ORAL_CAPSULE | Freq: Three times a day (TID) | ORAL | 4 refills | Status: DC

## 2016-06-24 MED ORDER — VARDENAFIL HCL 20 MG PO TABS: 20.0000 mg | ORAL_TABLET | Freq: Every day | ORAL | 10 refills | Status: DC | PRN

## 2016-06-24 MED ORDER — SILDENAFIL CITRATE 20 MG PO TABS: ORAL_TABLET | ORAL | 11 refills | Status: DC

## 2016-06-24 MED ORDER — TADALAFIL 20 MG PO TABS: ORAL_TABLET | ORAL | 11 refills | Status: DC

## 2016-06-24 MED ORDER — LORAZEPAM 0.5 MG PO TABS: ORAL_TABLET | ORAL | 4 refills | Status: DC

## 2016-06-24 NOTE — Progress Notes (Signed)
Perry Nielsen is a 75 year old married male nonsmoker retired Interior and spatial designer who comes in today for an office visit  He's been under the meticulous care of Dr. Cain Sieve because of [place]. He saw Dr. Atlee Abide. One month ago and everything was stable. He's post go back in 4 months for follow-up.  E not had an eye exam this year. Recommend routine eye care, recommend regular dental care, colonoscopy last week was normal. 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 He says Levitra is working well but he can't ejaculate. We discussed various options.  He's also had a history of BPH. PSA 04/01/2016 was 1.08. BP 118/82 (BP Location: Left Arm, Patient Position: Sitting, Cuff Size: Normal)   Pulse 77   Temp 97.8 F (36.6 C) (Oral)   Ht 5\' 11"  (1.803 m)   Wt 159 lb 12.8 oz (72.5 kg)   BMI 22.29 kg/m  Examination of the HEENT was negative except she's had both cataracts removed and implants. Left lens is a little cloudy but he says he sees well. Neck was supple no adenopathy no carotid bruits thyroid normal cardiopulmonary exam normal Dahms exam normal extremities he has a brace on his right ankle. He walks with a cane. Unable to play golf because he is concerned he may fall because of the weakness in that ankle.  He states he swimming 3-4 days a week. Asked him to add weight lifting.  Impression #1 status post lymphoma......... continue follow-up by Dr. Cain Sieve  #2 erectile dysfunction......Marland Kitchen refill meds  #3 chronic pain....... continue Neurontin and Vicodin  Number for anxiety continue Ativan twice a day

## 2016-06-24 NOTE — Progress Notes (Signed)
Pre visit review using our clinic review tool, if applicable. No additional management support is needed unless otherwise documented below in the visit note. 

## 2016-06-24 NOTE — Patient Instructions (Signed)
Generic Viagra 20 mg.............Marland Kitchen 1-2 tablets 3 hours prior to sex  Cialis 20 mg........Marland Kitchen 1/2-1 tablet 3 hours prior to sex or the Levitra as you've done in the past  Continue other medications  Follow-up in one year sooner if any problems  Remember to get your eye exam

## 2016-07-02 ENCOUNTER — Telehealth: Payer: Self-pay | Admitting: *Deleted

## 2016-07-02 DIAGNOSIS — C8580 Other specified types of non-Hodgkin lymphoma, unspecified site: Secondary | ICD-10-CM

## 2016-07-02 MED ORDER — HYDROCODONE-ACETAMINOPHEN 5-325 MG PO TABS: 1.0000 | ORAL_TABLET | Freq: Every evening | ORAL | 0 refills | Status: DC | PRN

## 2016-07-02 NOTE — Telephone Encounter (Signed)
Call from pt's wife requesting refill of Hydrocodone he takes for foot drop. Informed her she can pick up script today.

## 2016-07-03 DIAGNOSIS — H26491 Other secondary cataract, right eye: Secondary | ICD-10-CM | POA: Diagnosis not present

## 2016-07-03 DIAGNOSIS — H26492 Other secondary cataract, left eye: Secondary | ICD-10-CM | POA: Diagnosis not present

## 2016-07-30 ENCOUNTER — Other Ambulatory Visit: Payer: Self-pay | Admitting: *Deleted

## 2016-07-30 DIAGNOSIS — C8580 Other specified types of non-Hodgkin lymphoma, unspecified site: Secondary | ICD-10-CM

## 2016-07-30 MED ORDER — HYDROCODONE-ACETAMINOPHEN 5-325 MG PO TABS: 1.0000 | ORAL_TABLET | Freq: Every evening | ORAL | 0 refills | Status: DC | PRN

## 2016-07-30 NOTE — Telephone Encounter (Signed)
Message from pt's wife requesting refill of Hydrocodone. Med refilled, per Dr. Benay Spice.

## 2016-08-25 ENCOUNTER — Other Ambulatory Visit: Payer: Self-pay | Admitting: *Deleted

## 2016-08-25 DIAGNOSIS — C8580 Other specified types of non-Hodgkin lymphoma, unspecified site: Secondary | ICD-10-CM

## 2016-08-25 MED ORDER — HYDROCODONE-ACETAMINOPHEN 5-325 MG PO TABS: 1.0000 | ORAL_TABLET | Freq: Every evening | ORAL | 0 refills | Status: DC | PRN

## 2016-08-25 NOTE — Telephone Encounter (Addendum)
Message from pt's wife requesting refill of Hydrocodone. She confirms he is taking one tablet at bedtime for pain related to his foot drop. Wife notified that script is ready for pick up.

## 2016-09-22 ENCOUNTER — Telehealth: Payer: Self-pay | Admitting: Oncology

## 2016-09-22 ENCOUNTER — Ambulatory Visit (HOSPITAL_BASED_OUTPATIENT_CLINIC_OR_DEPARTMENT_OTHER): Payer: Medicare Other | Admitting: Oncology

## 2016-09-22 ENCOUNTER — Other Ambulatory Visit: Payer: Self-pay | Admitting: *Deleted

## 2016-09-22 VITALS — BP 127/85 | HR 83 | Temp 98.1°F | Resp 18 | Ht 71.0 in | Wt 166.0 lb

## 2016-09-22 DIAGNOSIS — C8580 Other specified types of non-Hodgkin lymphoma, unspecified site: Secondary | ICD-10-CM | POA: Diagnosis not present

## 2016-09-22 DIAGNOSIS — M79671 Pain in right foot: Secondary | ICD-10-CM | POA: Diagnosis not present

## 2016-09-22 DIAGNOSIS — N5319 Other ejaculatory dysfunction: Secondary | ICD-10-CM

## 2016-09-22 MED ORDER — CEPHALEXIN 500 MG PO CAPS: 500.0000 mg | ORAL_CAPSULE | Freq: Three times a day (TID) | ORAL | 0 refills | Status: DC

## 2016-09-22 MED ORDER — HYDROCODONE-ACETAMINOPHEN 5-325 MG PO TABS: 1.0000 | ORAL_TABLET | Freq: Every evening | ORAL | 0 refills | Status: DC | PRN

## 2016-09-22 NOTE — Telephone Encounter (Signed)
Appointments scheduled per 3.20.18 LOS. Patient given AVS report and calendars with future scheduled appointments.  °

## 2016-09-22 NOTE — Progress Notes (Signed)
[facility] OFFICE PROGRESS NOTE   Diagnosis: Non-Hodgkin's lymphoma  INTERVAL HISTORY:   Perry Nielsen returns as scheduled. He feels well. Good appetite. No fever or night sweats. Stable discomfort in the right foot night. He takes hydrocodone at night. He continues gabapentin during the day. He has no difficulty obtaining an erection, but does not ejaculate. He plans to schedule an appointment with Dr. Karsten Ro. He swims approximately 1 mile per day.   Objective:  Vital signs in last 24 hours:  Blood pressure 127/85, pulse 83, temperature 98.1 F (36.7 C), temperature source Oral, resp. rate 18, height 5' 11" (1.803 m), weight 166 lb (75.3 kg), SpO2 94 %.    HEENT: Neck without mass Lymphatics: No cervical, supraclavicular, axillary, or inguinal nodes Resp: Clear bilaterally Cardio: Regular rate and rhythm, 2/6 systolic murmur GI: No hepatosplenomegaly, no mass, nontender Vascular: No leg edema Neuro: Partial right foot drop  Skin: small amount of purulent discharge at the right great toenail, erythema at the distal right great toe    Medications: I have reviewed the patient's current medications.  Assessment/Plan: 1. 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 History of thrombocytopenia predating chemotherapy 3. T7 compression fracture-likely pathologic 4. 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 Right foot pain-likely secondary to nerve compression from the sacral mass, improved 6. Diffuse erythematous rash, appearance consistent with a "drug" rash-resolved 7. History of low-grade fever likely secondary to atelectasis/postop fever versus tumor fever 8. Hepatitis B core antibody positive 10. Admission 04/18/2015 through 04/23/2015 presenting with fever; amylase and lipase elevated.   Abdominal CT showed inflammation in the left upper quadrant and associated loculated fluid collections status post drainage procedure, fluid with high levels of amylase and lipase; cultures negative; question pancreatitis.   04/26/2015 lipase mildly elevated, amylase normal.   CT abdomen/pelvis 05/14/2015 with a persistent 6.5 x 3.8 cm posterior pancreatic tail fluid collection similar in size to the 04/18/2015 CT study. Additional smaller fluid collections in the far lateral left upper quadrant; extensive patchy groundglass opacity at the lung bases, significantly increased; stable expansile lytic lesion in the upper right sacrum. 11. 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 Severe neutropenia 05/13/2015. Likely delayed effect of Rituxan. Resolved 13. Chest x-ray 05/15/2015 with bilateral mid to lower lung interstitial thickening with some hazy lower lung zone groundglass opacity. Partially improved on the chest x-ray 05/24/2015 14. New-onset rapid atrial fibrillation 05/29/2015 15. Admission with acute onset upper abdomen/low anterior chest pain 07/07/2015-most likely related to Neulasta, spontaneously resolved 16. Altered mental status- Potentially related to persistent delirium following a prolonged critical illness , depression, dementia, or psychosis. Brain MRI 10/22/2015  with no acute or metastatic intracranial abnormality. Stable appearance of the brain since 2016. Mental status improved 10/24/2015. He is followed by neurology     Disposition:  Perry Nielsen is in clinical remission from non-Hodgkin's lymphoma. He appears  to have an infection at the right great toenail. He prescribed Keflex. He is scheduled to see a podiatrist tomorrow.  He will see Dr. Karsten Ro to evaluate the ejaculatory dysfunction. This could be related to the sacral lymphoma diagnosed in 2006, but he reports the difficulty with ejaculation predated the diagnosis of lymphoma.  He will return for an office visit in 4 months.   Betsy Coder, MD  09/22/2016  3:52 PM

## 2016-09-23 DIAGNOSIS — L02611 Cutaneous abscess of right foot: Secondary | ICD-10-CM | POA: Diagnosis not present

## 2016-09-23 DIAGNOSIS — M21379 Foot drop, unspecified foot: Secondary | ICD-10-CM | POA: Diagnosis not present

## 2016-09-23 DIAGNOSIS — M79674 Pain in right toe(s): Secondary | ICD-10-CM | POA: Diagnosis not present

## 2016-10-08 DIAGNOSIS — L03031 Cellulitis of right toe: Secondary | ICD-10-CM | POA: Diagnosis not present

## 2016-10-22 ENCOUNTER — Other Ambulatory Visit: Payer: Self-pay | Admitting: *Deleted

## 2016-10-22 ENCOUNTER — Encounter: Payer: Self-pay | Admitting: *Deleted

## 2016-10-22 DIAGNOSIS — C8580 Other specified types of non-Hodgkin lymphoma, unspecified site: Secondary | ICD-10-CM

## 2016-10-22 MED ORDER — HYDROCODONE-ACETAMINOPHEN 5-325 MG PO TABS: 1.0000 | ORAL_TABLET | Freq: Every evening | ORAL | 0 refills | Status: DC | PRN

## 2016-10-22 NOTE — Telephone Encounter (Signed)
wife celia calling for a refill on patient's hydrocodone. Signed script left at front for patient p/u. Wife notified.

## 2016-11-08 ENCOUNTER — Other Ambulatory Visit: Payer: Self-pay | Admitting: Family Medicine

## 2016-11-08 DIAGNOSIS — F411 Generalized anxiety disorder: Secondary | ICD-10-CM

## 2016-11-19 ENCOUNTER — Telehealth: Payer: Self-pay

## 2016-11-19 DIAGNOSIS — C8580 Other specified types of non-Hodgkin lymphoma, unspecified site: Secondary | ICD-10-CM

## 2016-11-19 MED ORDER — HYDROCODONE-ACETAMINOPHEN 5-325 MG PO TABS: 1.0000 | ORAL_TABLET | Freq: Every evening | ORAL | 0 refills | Status: DC | PRN

## 2016-11-19 NOTE — Telephone Encounter (Signed)
Wife is requesting hydrocodone refill. Please call when ready for pickup.

## 2016-11-19 NOTE — Addendum Note (Signed)
Addended by: Brien Few on: 11/19/2016 05:56 PM   Modules accepted: Orders

## 2016-11-24 ENCOUNTER — Other Ambulatory Visit: Payer: Self-pay | Admitting: Physician Assistant

## 2016-11-24 DIAGNOSIS — L570 Actinic keratosis: Secondary | ICD-10-CM | POA: Diagnosis not present

## 2016-11-24 DIAGNOSIS — D0462 Carcinoma in situ of skin of left upper limb, including shoulder: Secondary | ICD-10-CM | POA: Diagnosis not present

## 2016-11-24 DIAGNOSIS — D229 Melanocytic nevi, unspecified: Secondary | ICD-10-CM | POA: Diagnosis not present

## 2016-11-24 DIAGNOSIS — L821 Other seborrheic keratosis: Secondary | ICD-10-CM | POA: Diagnosis not present

## 2016-12-18 ENCOUNTER — Other Ambulatory Visit: Payer: Self-pay | Admitting: *Deleted

## 2016-12-18 DIAGNOSIS — C8580 Other specified types of non-Hodgkin lymphoma, unspecified site: Secondary | ICD-10-CM

## 2016-12-18 MED ORDER — HYDROCODONE-ACETAMINOPHEN 5-325 MG PO TABS: 1.0000 | ORAL_TABLET | Freq: Every evening | ORAL | 0 refills | Status: DC | PRN

## 2016-12-18 NOTE — Telephone Encounter (Signed)
Message from wife requesting Hydrocodone refill. Pt takes this for painful foot drop. Reviewed with Dr. Benay Spice, notified wife prescription is ready for pick up.

## 2017-01-14 ENCOUNTER — Other Ambulatory Visit: Payer: Self-pay | Admitting: *Deleted

## 2017-01-14 DIAGNOSIS — D0462 Carcinoma in situ of skin of left upper limb, including shoulder: Secondary | ICD-10-CM | POA: Diagnosis not present

## 2017-01-14 DIAGNOSIS — C8580 Other specified types of non-Hodgkin lymphoma, unspecified site: Secondary | ICD-10-CM

## 2017-01-14 MED ORDER — HYDROCODONE-ACETAMINOPHEN 5-325 MG PO TABS: 1.0000 | ORAL_TABLET | Freq: Every evening | ORAL | 0 refills | Status: DC | PRN

## 2017-01-14 NOTE — Telephone Encounter (Signed)
Message from wife requesting hydrocodone refill. Pt takes for foot drop pain. Returned call, informed her she can pick up script on 7/13.

## 2017-01-19 ENCOUNTER — Telehealth: Payer: Self-pay | Admitting: Nurse Practitioner

## 2017-01-19 ENCOUNTER — Ambulatory Visit (HOSPITAL_BASED_OUTPATIENT_CLINIC_OR_DEPARTMENT_OTHER): Payer: Medicare Other | Admitting: Nurse Practitioner

## 2017-01-19 VITALS — BP 111/60 | HR 70 | Temp 97.9°F | Resp 18 | Ht 71.0 in | Wt 171.6 lb

## 2017-01-19 DIAGNOSIS — Z86718 Personal history of other venous thrombosis and embolism: Secondary | ICD-10-CM | POA: Diagnosis not present

## 2017-01-19 DIAGNOSIS — I4891 Unspecified atrial fibrillation: Secondary | ICD-10-CM

## 2017-01-19 DIAGNOSIS — C8580 Other specified types of non-Hodgkin lymphoma, unspecified site: Secondary | ICD-10-CM

## 2017-01-19 NOTE — Telephone Encounter (Signed)
Scheduled appt per 7/17 los - f/u in 6 months - gave patient AVS and calender per los.

## 2017-01-19 NOTE — Progress Notes (Signed)
[facility] OFFICE PROGRESS NOTE   Diagnosis:  Non-Hodgkin's lymphoma  INTERVAL HISTORY:   Perry Nielsen returns as scheduled. He feels well. No interim illnesses or infections. No fevers or sweats. He has a good appetite. He is gaining weight. He continues to have "achy" discomfort at the right foot mainly at bedtime. He denies nausea/vomiting. No diarrhea or constipation. No enlarged lymph nodes. He swims 1 mile 4 days a week.  Objective:  Vital signs in last 24 hours:  Blood pressure 111/60, pulse 70, temperature 97.9 F (36.6 C), temperature source Oral, resp. rate 18, height _0  (1.803 m), weight 171 lb 9.6 oz (77.8 kg), SpO2 98 %.    HEENT: Neck without mass. Lymphatics: No palpable cervical, supra clavicular, axillary or inguinal lymph nodes. Resp: Lungs clear bilaterally. Cardio: Regular rate and rhythm. GI: Abdomen soft and nontender. No hepatosplenomegaly. Vascular: No leg edema.   Lab Results:  Lab Results  Component Value Date   WBC 10.6 (H) 05/26/2016   HGB 14.4 05/26/2016   HCT 44.7 05/26/2016   MCV 97.2 05/26/2016   PLT 211 05/26/2016   NEUTROABS 6.0 05/26/2016    Imaging:  No results found.  Medications: I have reviewed the patient's current medications.  Assessment/Plan: 1. 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 History of thrombocytopenia predating chemotherapy 3. T7 compression fracture-likely pathologic 4. 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 Right foot pain-likely secondary to nerve compression from the sacral mass, improved 6. Diffuse erythematous rash, appearance consistent with a "drug" rash-resolved 7. History of low-grade fever likely secondary to atelectasis/postop fever versus tumor fever 8. Hepatitis B core antibody positive 10. Admission 04/18/2015 through 04/23/2015 presenting with fever; amylase and lipase elevated.   Abdominal CT showed inflammation in the left upper quadrant and associated loculated fluid collections status post drainage procedure, fluid with high levels of amylase and lipase; cultures negative; question pancreatitis.   04/26/2015 lipase mildly elevated, amylase normal.   CT abdomen/pelvis 05/14/2015 with a persistent 6.5 x 3.8 cm posterior pancreatic tail fluid collection similar in size to the 04/18/2015 CT study. Additional smaller fluid collections in the far lateral left upper quadrant; extensive patchy groundglass opacity at the lung bases, significantly increased; stable expansile lytic lesion in the upper right sacrum. 11. 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 Severe neutropenia 05/13/2015. Likely delayed effect of Rituxan. Resolved 13. Chest x-ray 05/15/2015 with bilateral mid to lower lung interstitial thickening with some hazy lower lung zone groundglass opacity. Partially improved on the chest x-ray 05/24/2015 14. New-onset rapid atrial fibrillation 05/29/2015 15. Admission with acute onset upper abdomen/low anterior chest pain 07/07/2015-most likely related to Neulasta, spontaneously resolved 16. Altered mental status- Potentially related to persistent delirium following  a prolonged critical illness , depression, dementia, or psychosis. Brain MRI 10/22/2015 with no acute or metastatic intracranial abnormality. Stable appearance of the brain since 2016.  Mental status improved 10/24/2015. He is followed by neurology   Disposition: Perry Nielsen appears well. He remains in clinical remission from non-Hodgkin's lymphoma. We scheduled a six-month follow-up visit. He understands to contact the office in the interim with any problems.    Ned Card ANP/GNP-BC   01/19/2017  2:52 PM

## 2017-01-25 DIAGNOSIS — L089 Local infection of the skin and subcutaneous tissue, unspecified: Secondary | ICD-10-CM | POA: Diagnosis not present

## 2017-02-15 ENCOUNTER — Telehealth: Payer: Self-pay

## 2017-02-15 DIAGNOSIS — C8580 Other specified types of non-Hodgkin lymphoma, unspecified site: Secondary | ICD-10-CM

## 2017-02-15 MED ORDER — HYDROCODONE-ACETAMINOPHEN 5-325 MG PO TABS: 1.0000 | ORAL_TABLET | Freq: Every evening | ORAL | 0 refills | Status: DC | PRN

## 2017-02-15 NOTE — Addendum Note (Signed)
Addended by: Brien Few on: 02/15/2017 01:44 PM   Modules accepted: Orders

## 2017-02-15 NOTE — Telephone Encounter (Signed)
Wife called for hydrocodone refill.

## 2017-02-15 NOTE — Telephone Encounter (Signed)
Left message to inform wife prescription is ready for pick up.

## 2017-02-28 DIAGNOSIS — H2513 Age-related nuclear cataract, bilateral: Secondary | ICD-10-CM | POA: Diagnosis not present

## 2017-03-16 ENCOUNTER — Other Ambulatory Visit: Payer: Self-pay | Admitting: *Deleted

## 2017-03-16 ENCOUNTER — Telehealth: Payer: Self-pay | Admitting: Emergency Medicine

## 2017-03-16 DIAGNOSIS — C8580 Other specified types of non-Hodgkin lymphoma, unspecified site: Secondary | ICD-10-CM

## 2017-03-16 MED ORDER — HYDROCODONE-ACETAMINOPHEN 5-325 MG PO TABS: 1.0000 | ORAL_TABLET | Freq: Every evening | ORAL | 0 refills | Status: DC | PRN

## 2017-03-16 NOTE — Telephone Encounter (Signed)
Informed patient that [facility]co 5-325 Rx is in book for pickup.

## 2017-03-16 NOTE — Telephone Encounter (Signed)
Message from pt's wife requesting Hydrocodone refill.

## 2017-03-23 ENCOUNTER — Encounter: Payer: Self-pay | Admitting: Family Medicine

## 2017-03-23 ENCOUNTER — Other Ambulatory Visit: Payer: Self-pay

## 2017-03-23 ENCOUNTER — Ambulatory Visit (INDEPENDENT_AMBULATORY_CARE_PROVIDER_SITE_OTHER): Payer: Medicare Other | Admitting: Family Medicine

## 2017-03-23 VITALS — BP 110/86 | HR 62 | Temp 97.3°F | Ht 71.0 in | Wt 172.0 lb

## 2017-03-23 DIAGNOSIS — F411 Generalized anxiety disorder: Secondary | ICD-10-CM

## 2017-03-23 DIAGNOSIS — R351 Nocturia: Secondary | ICD-10-CM | POA: Diagnosis not present

## 2017-03-23 DIAGNOSIS — F528 Other sexual dysfunction not due to a substance or known physiological condition: Secondary | ICD-10-CM

## 2017-03-23 DIAGNOSIS — Z23 Encounter for immunization: Secondary | ICD-10-CM

## 2017-03-23 DIAGNOSIS — Z0001 Encounter for general adult medical examination with abnormal findings: Secondary | ICD-10-CM | POA: Diagnosis not present

## 2017-03-23 DIAGNOSIS — Z0000 Encounter for general adult medical examination without abnormal findings: Secondary | ICD-10-CM

## 2017-03-23 DIAGNOSIS — N401 Enlarged prostate with lower urinary tract symptoms: Secondary | ICD-10-CM

## 2017-03-23 LAB — HEPATIC FUNCTION PANEL
ALBUMIN: 4 g/dL (ref 3.5–5.2)
ALK PHOS: 78 U/L (ref 39–117)
ALT: 17 U/L (ref 0–53)
AST: 18 U/L (ref 0–37)
BILIRUBIN TOTAL: 0.9 mg/dL (ref 0.2–1.2)
Bilirubin, Direct: 0.2 mg/dL (ref 0.0–0.3)
Total Protein: 6.3 g/dL (ref 6.0–8.3)

## 2017-03-23 LAB — CBC WITH DIFFERENTIAL/PLATELET
BASOS ABS: 0.1 10*3/uL (ref 0.0–0.1)
Basophils Relative: 0.8 % (ref 0.0–3.0)
EOS PCT: 4.9 % (ref 0.0–5.0)
Eosinophils Absolute: 0.4 10*3/uL (ref 0.0–0.7)
HEMATOCRIT: 48.8 % (ref 39.0–52.0)
Hemoglobin: 15.9 g/dL (ref 13.0–17.0)
LYMPHS PCT: 35.9 % (ref 12.0–46.0)
Lymphs Abs: 3.2 10*3/uL (ref 0.7–4.0)
MCHC: 32.6 g/dL (ref 30.0–36.0)
MCV: 101.1 fl — AB (ref 78.0–100.0)
MONOS PCT: 16.2 % — AB (ref 3.0–12.0)
Monocytes Absolute: 1.4 10*3/uL — ABNORMAL HIGH (ref 0.1–1.0)
Neutro Abs: 3.7 10*3/uL (ref 1.4–7.7)
Neutrophils Relative %: 42.2 % — ABNORMAL LOW (ref 43.0–77.0)
Platelets: 185 10*3/uL (ref 150.0–400.0)
RBC: 4.82 Mil/uL (ref 4.22–5.81)
RDW: 13.9 % (ref 11.5–15.5)
WBC: 8.8 10*3/uL (ref 4.0–10.5)

## 2017-03-23 LAB — BASIC METABOLIC PANEL
BUN: 15 mg/dL (ref 6–23)
CHLORIDE: 103 meq/L (ref 96–112)
CO2: 30 meq/L (ref 19–32)
Calcium: 9.3 mg/dL (ref 8.4–10.5)
Creatinine, Ser: 0.89 mg/dL (ref 0.40–1.50)
GFR: 88.42 mL/min (ref >60.00)
Glucose, Bld: 78 mg/dL (ref 70–99)
POTASSIUM: 3.8 meq/L (ref 3.5–5.1)
Sodium: 141 mEq/L (ref 135–145)

## 2017-03-23 LAB — PSA: PSA: 2.88 ng/mL (ref 0.10–4.00)

## 2017-03-23 LAB — POCT URINALYSIS DIPSTICK
Bilirubin, UA: NEGATIVE
Blood, UA: NEGATIVE
CLARITY UA: NEGATIVE
GLUCOSE UA: NEGATIVE
Ketones, UA: NEGATIVE
LEUKOCYTES UA: NEGATIVE
NITRITE UA: NEGATIVE
PROTEIN UA: NEGATIVE
Spec Grav, UA: 1.025 (ref 1.010–1.025)
Urobilinogen, UA: 0.2 E.U./dL
pH, UA: 5.5 (ref 5.0–8.0)

## 2017-03-23 LAB — LIPID PANEL
CHOL/HDL RATIO: 3
Cholesterol: 188 mg/dL (ref 0–200)
HDL: 61.3 mg/dL (ref >39.00)
LDL Cholesterol: 92 mg/dL (ref 0–99)
NONHDL: 127.02
Triglycerides: 173 mg/dL — ABNORMAL HIGH (ref 0.0–149.0)
VLDL: 34.6 mg/dL (ref 0.0–40.0)

## 2017-03-23 LAB — TSH: TSH: 5.28 u[IU]/mL — ABNORMAL HIGH (ref 0.35–4.50)

## 2017-03-23 MED ORDER — LORAZEPAM 0.5 MG PO TABS: 0.5000 mg | ORAL_TABLET | Freq: Two times a day (BID) | ORAL | 4 refills | Status: DC | PRN

## 2017-03-23 MED ORDER — VARDENAFIL HCL 20 MG PO TABS: 20.0000 mg | ORAL_TABLET | Freq: Every day | ORAL | 10 refills | Status: DC | PRN

## 2017-03-23 MED ORDER — SILDENAFIL CITRATE 20 MG PO TABS: ORAL_TABLET | ORAL | 11 refills | Status: DC

## 2017-03-23 MED ORDER — GABAPENTIN 100 MG PO CAPS: 100.0000 mg | ORAL_CAPSULE | Freq: Three times a day (TID) | ORAL | 4 refills | Status: DC

## 2017-03-23 NOTE — Patient Instructions (Signed)
Continue current medications  Labs today....... I will call you if there is anything abnormal.  Return in one year for general physical exam sooner if any problems

## 2017-03-23 NOTE — Progress Notes (Signed)
Perry Nielsen is a 76 year old married male nonsmoker who comes in today for annual physical examination because a history of lymphoma  He's followed in oncology and every 6 months. Clinically is in remission. He did have complications from the underlying lymphoma which resulted in a pathologic fracture of T7. He still has foot drop on his right leg and can't play golf. He swims 5 days a week.  Current medications include Neurontin 100 mg 3 times a day, Vicodin 5 mg daily at bedtime, Ativan 0.5 twice a day, and Levitra when necessary for mild ED  14 point review of systems reviewed and otherwise negative  Social history.......Marland Kitchen married lives here in [place] has grandchildren in college.  Vaccinations..... Flu shot and tetanus booster given today. He was given information on the shingles vaccine.  He gets routine eye care, dental care, colonoscopy 2017 normal. BP 110/86 (BP Location: Left Arm, Patient Position: Sitting, Cuff Size: Normal)   Pulse 62   Temp (!) 97.3 F (36.3 C) (Oral)   Ht 5\' 11"  (1.803 m)   Wt 172 lb (78 kg)   BMI 23.99 kg/m  Examination of the HEENT was negative except he wears glasses and has early cataracts. Neck was supple thyroid not enlarged no carotid bruits cardiopulmonary exam normal abdominal exam normal extremities normal skin normal peripheral pulses normal except for atrophy of his right calf and foot drop from previous lymphoma  #1 status post large cell B lymphoma,,,,,,,,, currently in remission  Mild ED,,,,,,,, refill medication.

## 2017-04-14 ENCOUNTER — Telehealth: Payer: Self-pay

## 2017-04-14 ENCOUNTER — Other Ambulatory Visit: Payer: Self-pay | Admitting: Emergency Medicine

## 2017-04-14 ENCOUNTER — Telehealth: Payer: Self-pay | Admitting: Emergency Medicine

## 2017-04-14 DIAGNOSIS — C8580 Other specified types of non-Hodgkin lymphoma, unspecified site: Secondary | ICD-10-CM

## 2017-04-14 MED ORDER — HYDROCODONE-ACETAMINOPHEN 5-325 MG PO TABS: 1.0000 | ORAL_TABLET | Freq: Every evening | ORAL | 0 refills | Status: DC | PRN

## 2017-04-14 NOTE — Telephone Encounter (Signed)
Wife called for hydrocodone refill.  Next OV 07/22/17

## 2017-04-14 NOTE — Telephone Encounter (Signed)
Called patients wife to inform her that hydrocodone rx was filled by Ned Card, np and can be picked up at their convenience. Patient verbalized understanding.

## 2017-04-20 ENCOUNTER — Other Ambulatory Visit: Payer: Self-pay | Admitting: Physician Assistant

## 2017-04-20 DIAGNOSIS — L570 Actinic keratosis: Secondary | ICD-10-CM | POA: Diagnosis not present

## 2017-04-20 DIAGNOSIS — D0439 Carcinoma in situ of skin of other parts of face: Secondary | ICD-10-CM | POA: Diagnosis not present

## 2017-05-10 ENCOUNTER — Other Ambulatory Visit: Payer: Self-pay | Admitting: *Deleted

## 2017-05-10 DIAGNOSIS — C8580 Other specified types of non-Hodgkin lymphoma, unspecified site: Secondary | ICD-10-CM

## 2017-05-10 MED ORDER — HYDROCODONE-ACETAMINOPHEN 5-325 MG PO TABS: 1.0000 | ORAL_TABLET | Freq: Every evening | ORAL | 0 refills | Status: DC | PRN

## 2017-05-10 NOTE — Telephone Encounter (Signed)
Message from pt's wife requesting refill of Hydrocodone. Pt takes for foot drop associated pain. Reviewed with Dr. Benay Spice, order received.  Wife notified that script is ready for pick up.

## 2017-06-07 ENCOUNTER — Other Ambulatory Visit: Payer: Self-pay | Admitting: *Deleted

## 2017-06-07 DIAGNOSIS — C8580 Other specified types of non-Hodgkin lymphoma, unspecified site: Secondary | ICD-10-CM

## 2017-06-07 MED ORDER — HYDROCODONE-ACETAMINOPHEN 5-325 MG PO TABS: 1.0000 | ORAL_TABLET | Freq: Every evening | ORAL | 0 refills | Status: DC | PRN

## 2017-06-07 NOTE — Addendum Note (Signed)
Addended by: Brien Few on: 06/07/2017 03:16 PM   Modules accepted: Orders

## 2017-06-07 NOTE — Telephone Encounter (Signed)
Message from pt's wife requesting refill of Hydrocodone he takes for foot drop pain.

## 2017-06-11 IMAGING — CT CT HEAD W/O CM
2 series · 16 of 30 positions shown, 20 images · non-contrast
Comparison: None.

CLINICAL DATA: Confusion since yesterday. Non-Hodgkin's lymphoma.
Initial encounter.

EXAM:
CT HEAD WITHOUT CONTRAST
TECHNIQUE: Contiguous axial images were obtained from the base of the skull
through the vertex without intravenous contrast.

[Series 2: head w/o · axial · non-contrast · 0.45mm/px · z∈[-98,+42]mm · 13 of 34 slices shown, 17 images]
[im 3/34  brain]
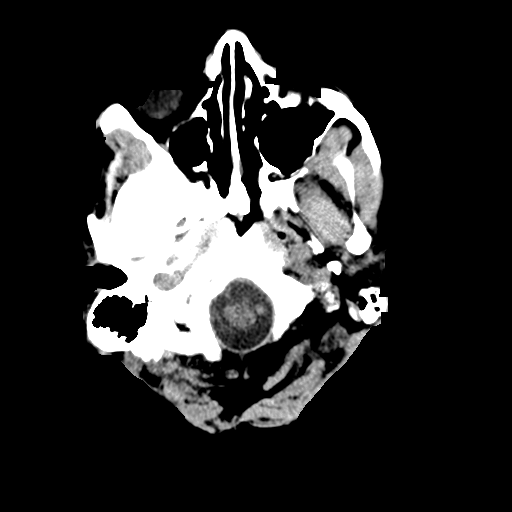
[im 3/34  bone]
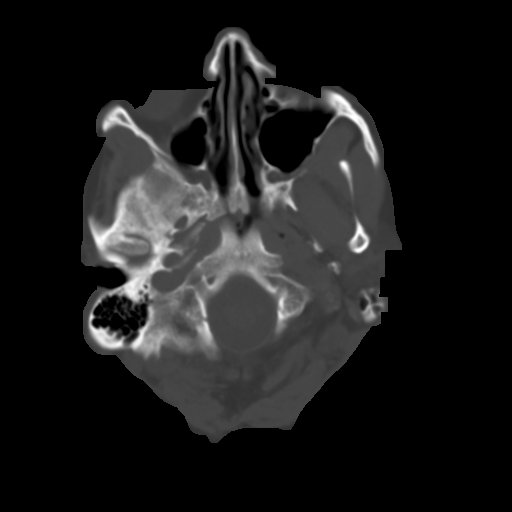
[im 5/34  brain]
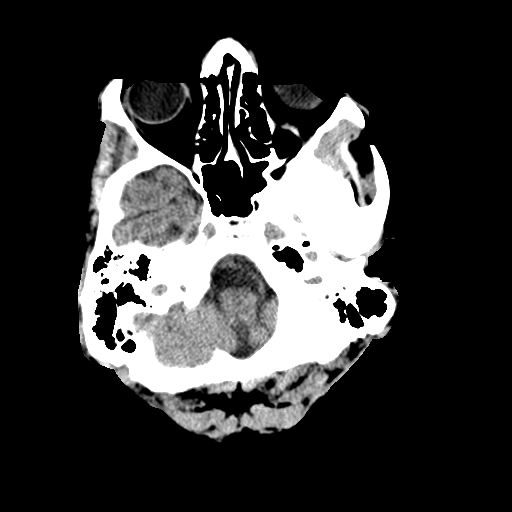
[im 8/34  brain]
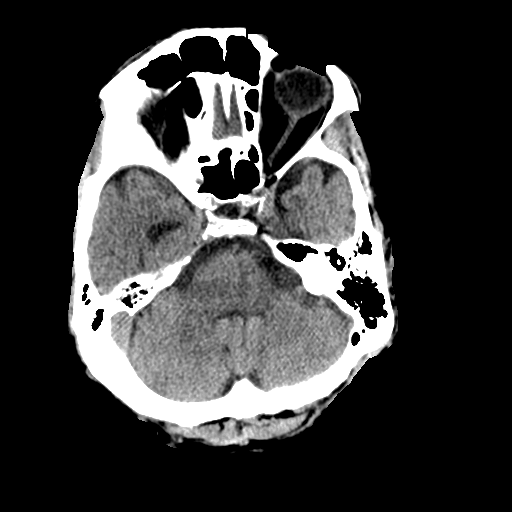
[im 10/34  brain]
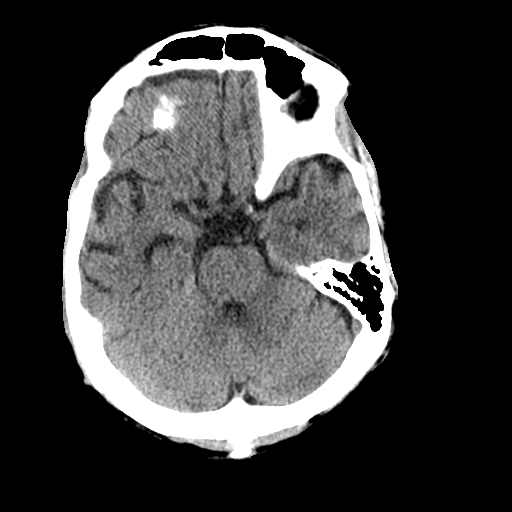
[im 12/34  brain]
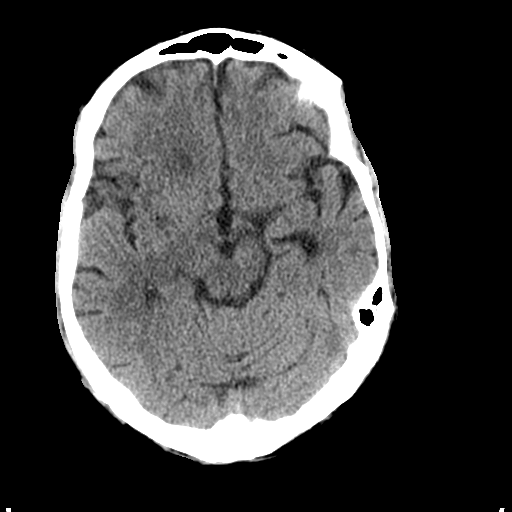
[im 12/34  bone]
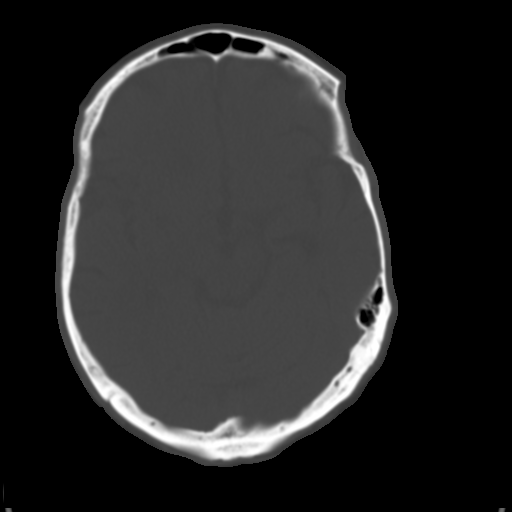
[im 15/34  brain]
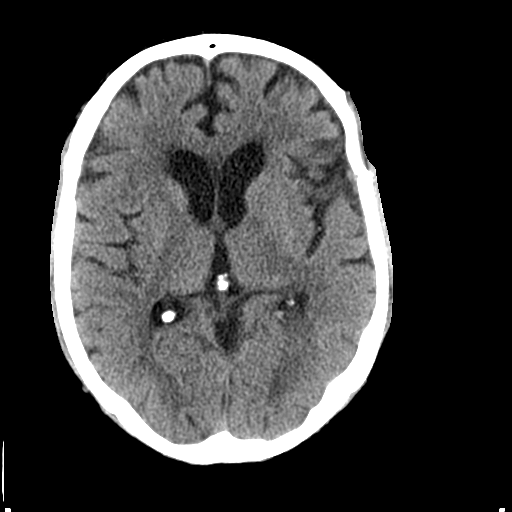
[im 17/34  brain]
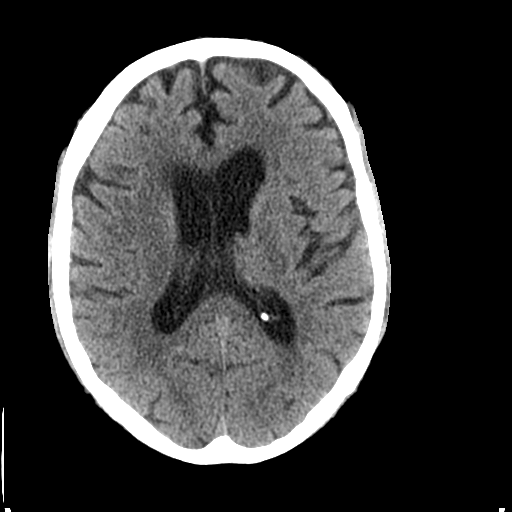
[im 19/34  brain]
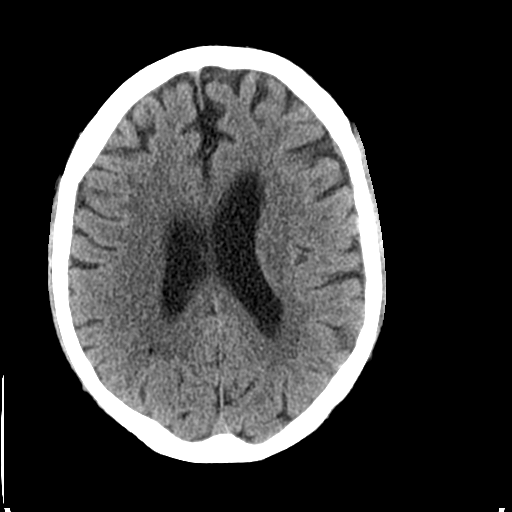
[im 22/34  brain]
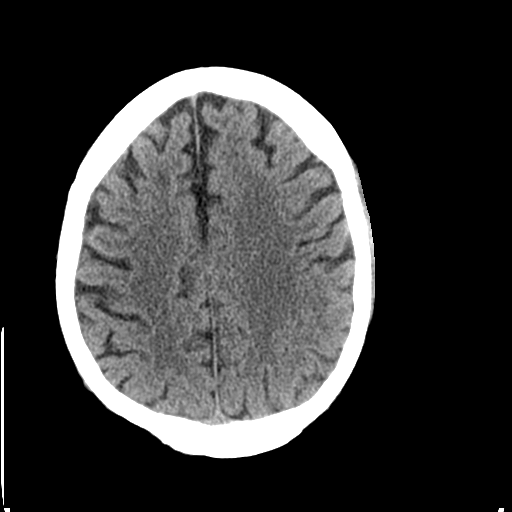
[im 22/34  bone]
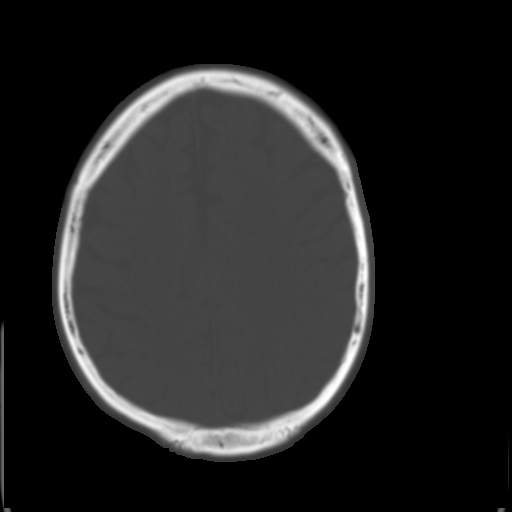
[im 24/34  brain]
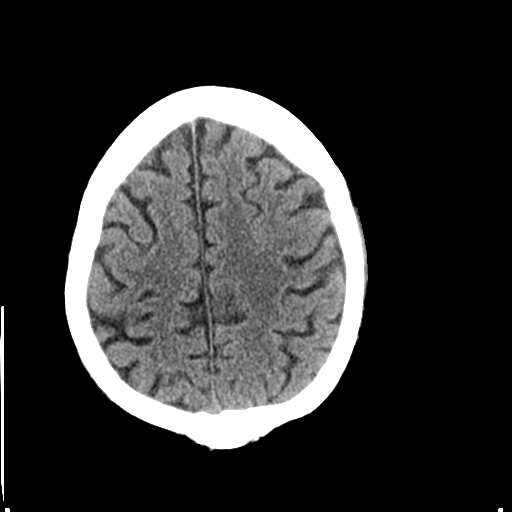
[im 26/34  brain]
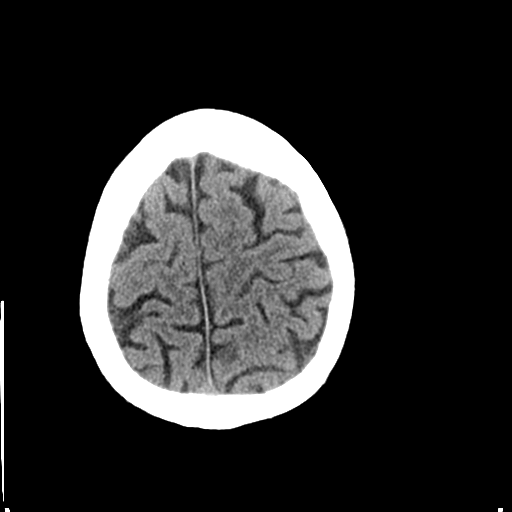
[im 29/34  brain]
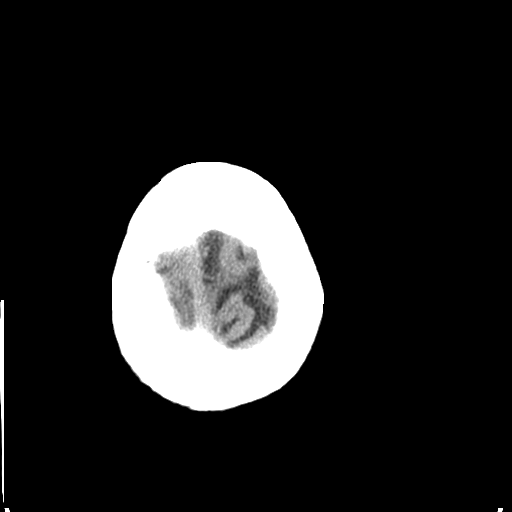
[im 31/34  brain]
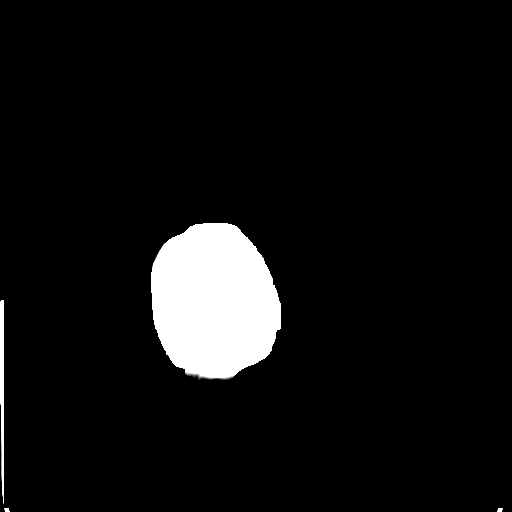
[im 31/34  bone]
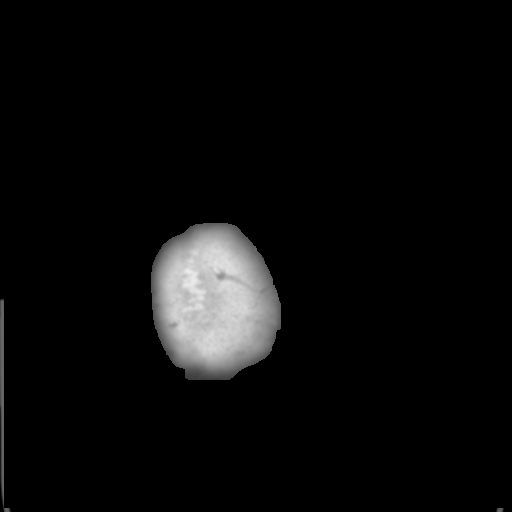

[Series 3: bone windows · axial · 0.45mm/px · z∈[-98,-53]mm · 3 of 34 slices shown]
[im 3/34  bone]
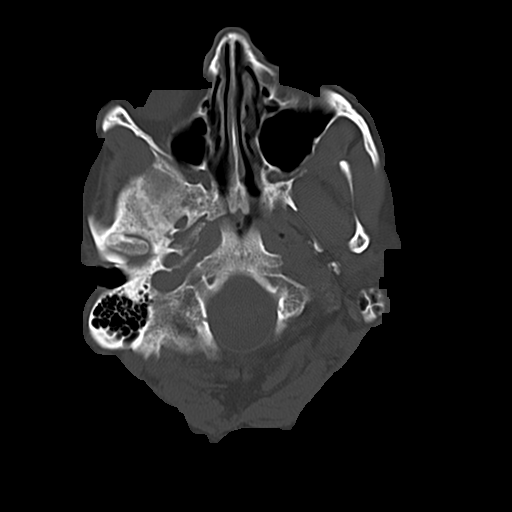
[im 8/34  bone]
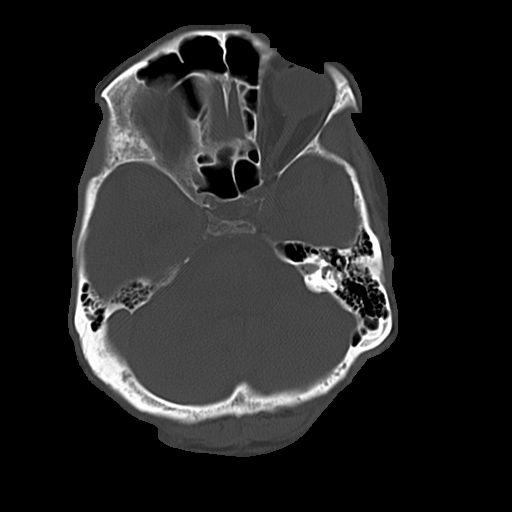
[im 12/34  bone]
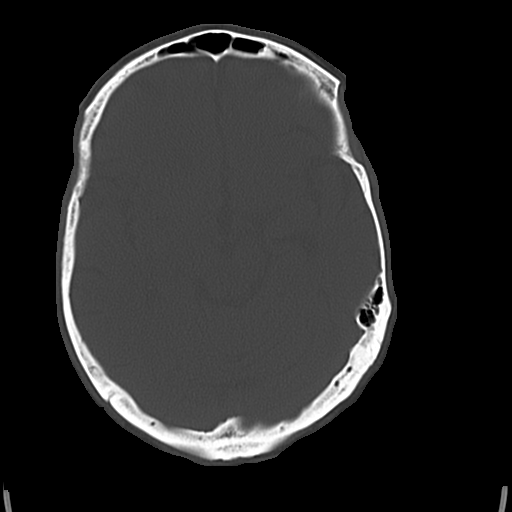

[16 of 30 positions shown; findings below may reference images not displayed]

FINDINGS: There is no evidence of acute intracranial hemorrhage, mass lesion,
brain edema or extra-axial fluid collection. The ventricles and
subarachnoid spaces are mildly prominent, consistent with mild
atrophy. There is no CT evidence of acute cortical infarction. There
is mild periventricular white matter disease, likely related to
chronic small vessel ischemic changes. Intracranial vascular
calcifications are noted.

The visualized paranasal sinuses, mastoid air cells and middle ears
are clear. The calvarium is intact.
IMPRESSION: No acute intracranial findings. Mild atrophy and periventricular
white matter disease.

## 2017-06-11 IMAGING — MR MR HEAD WO/W CM
10 of 13 series · 35 of 48 positions shown · IV contrast (Yes)
Comparison: CT head March 24, 2015 at 3630 hours

CLINICAL DATA: Confusion beginning yesterday, altered mental
status. History of lymphoma, spine metastasis skin cancer with spine
metastasis.

EXAM:
MRI HEAD WITHOUT AND WITH CONTRAST
TECHNIQUE: Multiplanar, multiecho pulse sequences of the brain and surrounding
structures were obtained without and with intravenous contrast.
CONTRAST:  13mL MULTIHANCE GADOBENATE DIMEGLUMINE 529 MG/ML IV SOLN

[Series 3: T1 · sagittal · 5.0mm · 0.47mm/px · 2 of 24 slices shown]
[im 1/24]
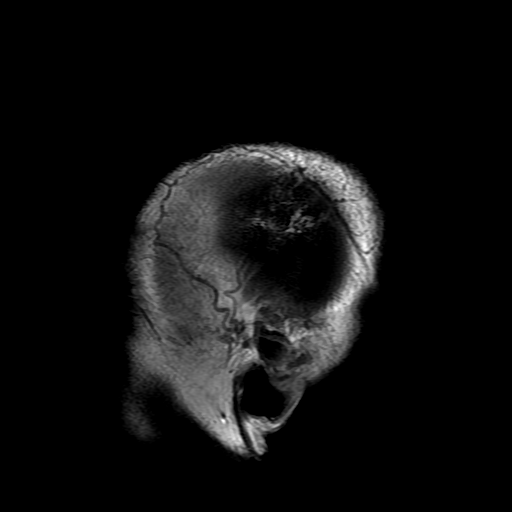
[im 12/24]
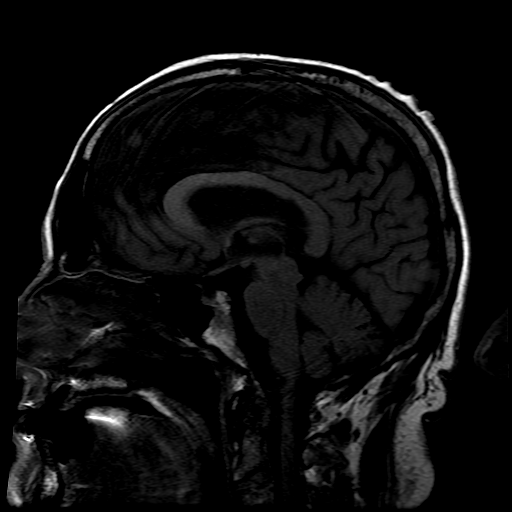

[Series 4: DWI · axial · 3.0mm · 1.09mm/px · z∈[-6,+144]mm · 9 of 102 slices shown (1 of 4)]
[im 1/102]
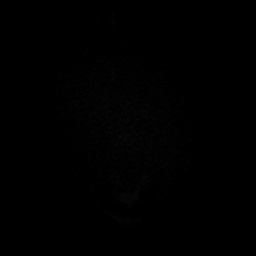
[im 13/102]
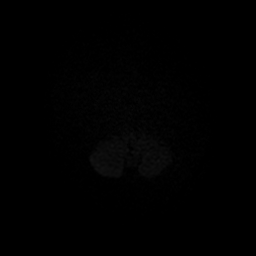
[im 26/102]
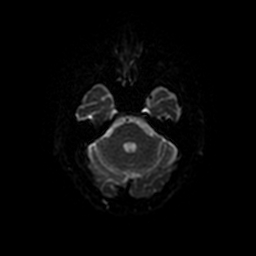
[im 38/102]
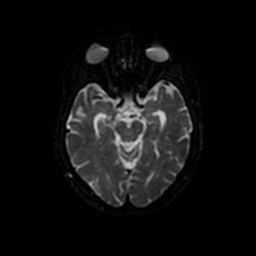
[im 51/102]
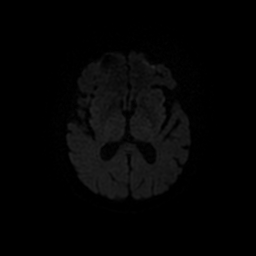
[im 64/102]
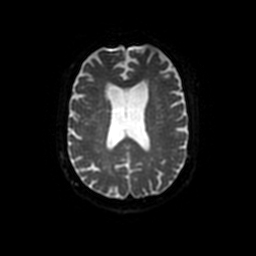
[im 76/102]
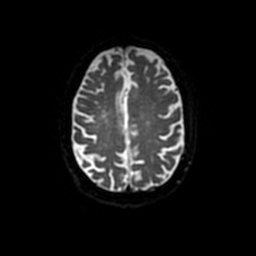
[im 89/102]
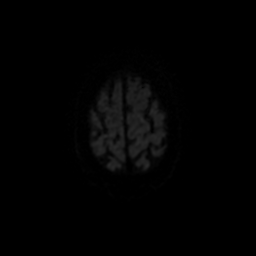
[im 102/102]
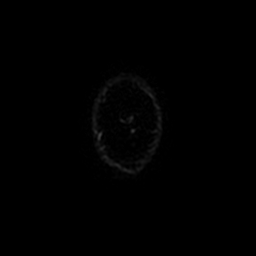

[Series 5: DWI · coronal · 5.0mm · 1.09mm/px · 6 of 69 slices shown (2 of 4)]
[im 1/69]
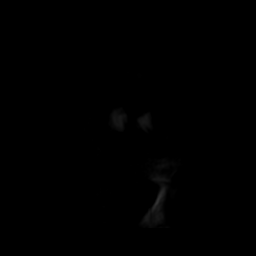
[im 14/69]
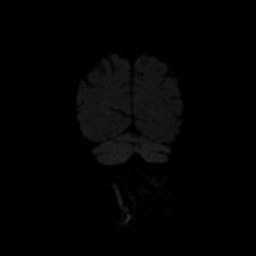
[im 28/69]
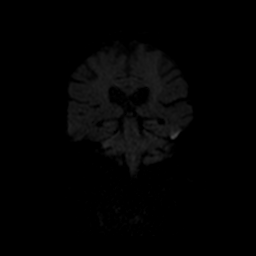
[im 41/69]
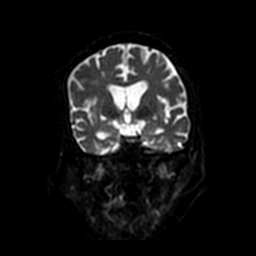
[im 55/69]
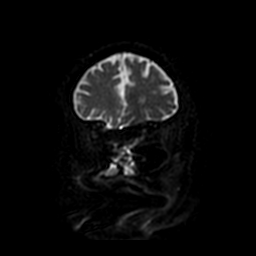
[im 69/69]
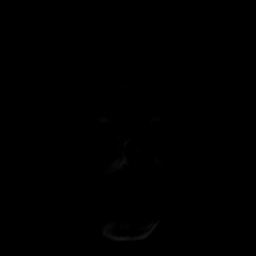

[Series 6: T2 · axial · 5.0mm · 0.43mm/px · z∈[+3,+151]mm · 2 of 24 slices shown]
[im 1/24]
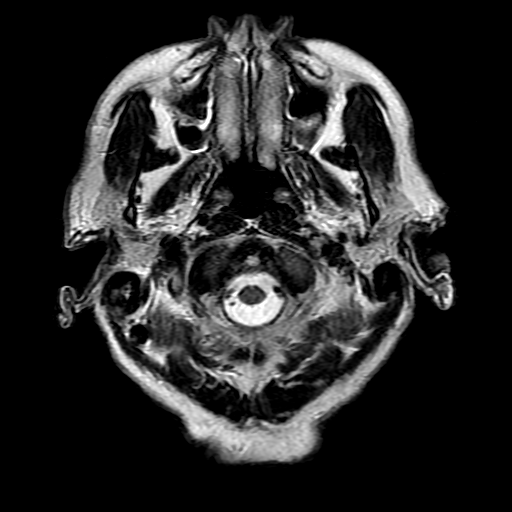
[im 24/24]
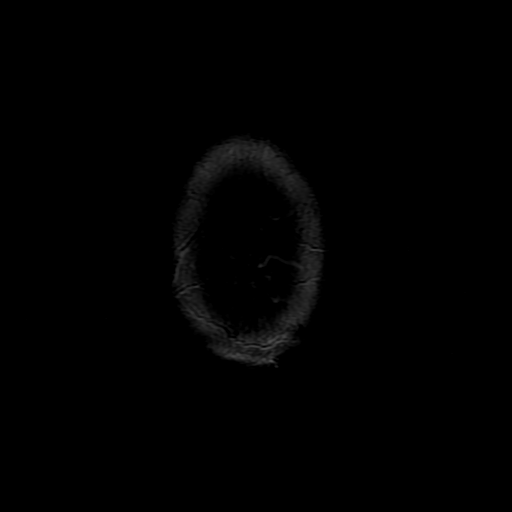

[Series 7: FLAIR · axial · 5.0mm · 0.43mm/px · z∈[-3,+157]mm · 2 of 24 slices shown]
[im 1/24]
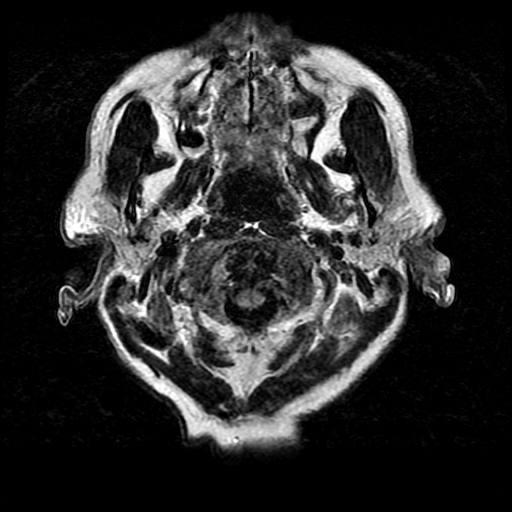
[im 24/24]
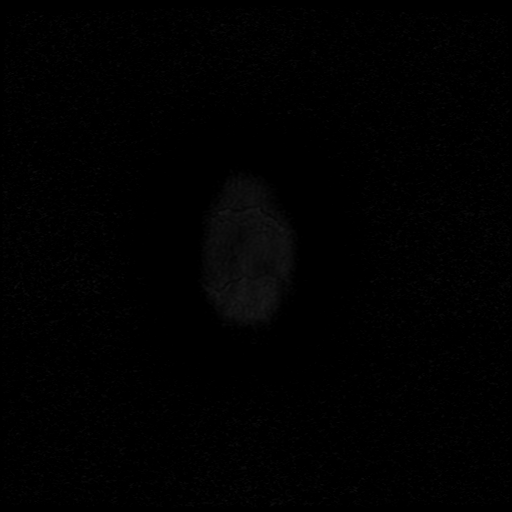

[Series 12: T1 post-contrast · coronal · 5.0mm · 0.45mm/px · 2 of 28 slices shown (1 of 2)]
[im 1/28]
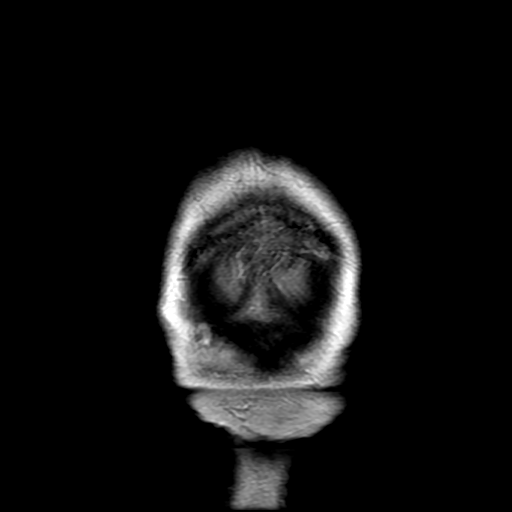
[im 28/28]
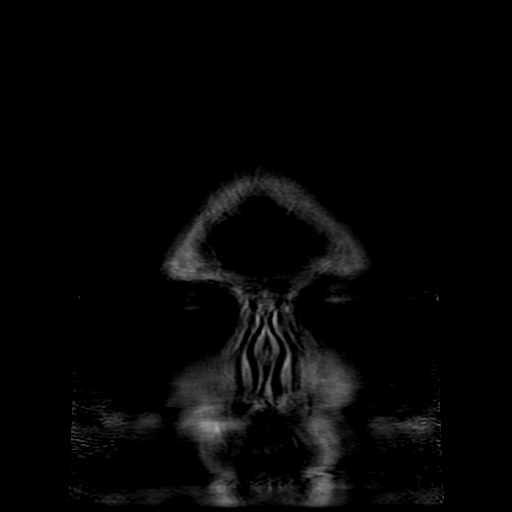

[Series 13: T1 post-contrast · sagittal · 5.0mm · 0.47mm/px · 2 of 24 slices shown (2 of 2)]
[im 1/24]
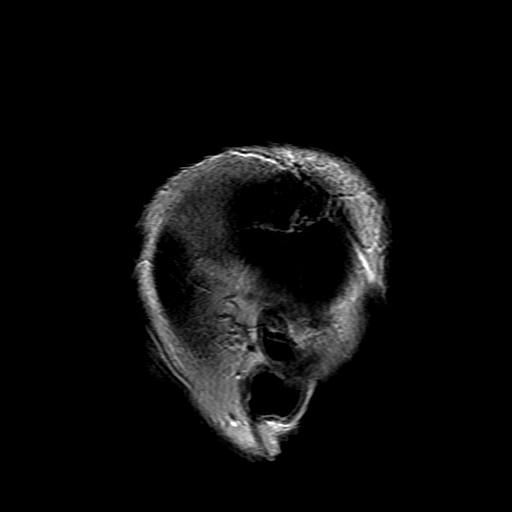
[im 24/24]
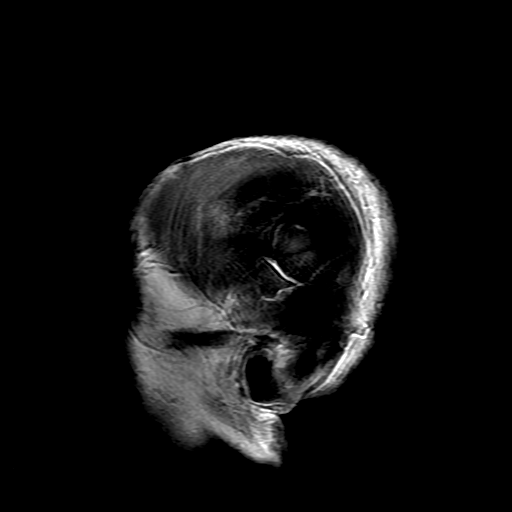

[Series 15: T2 post-contrast · coronal · 5.0mm · 0.45mm/px · 2 of 28 slices shown]
[im 1/28]
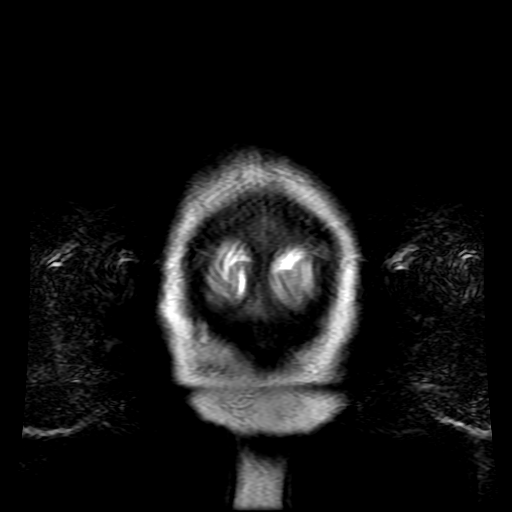
[im 28/28]
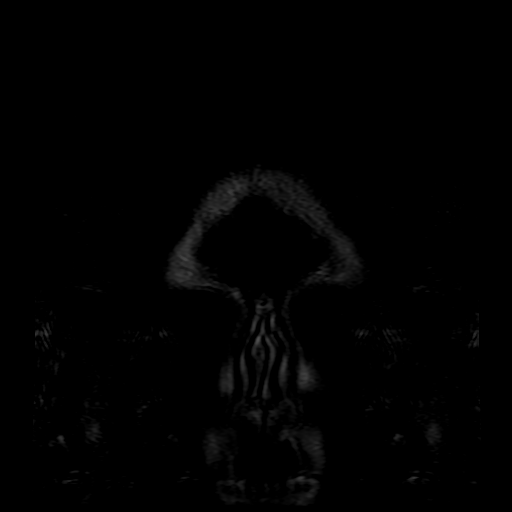

[Series 400: DWI · axial · 3.0mm · 1.09mm/px · z∈[-6,+144]mm · 5 of 51 slices shown (3 of 4)]
[im 1/51]
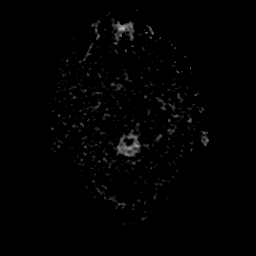
[im 13/51]
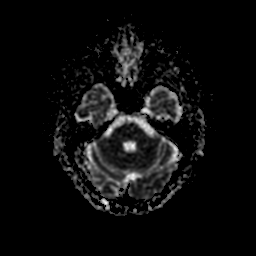
[im 26/51]
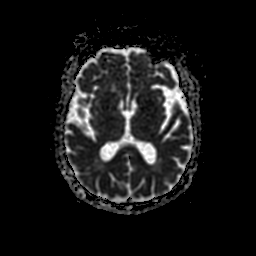
[im 38/51]
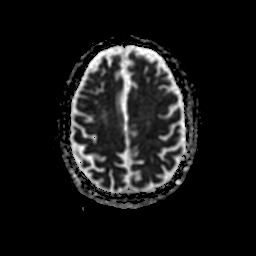
[im 51/51]
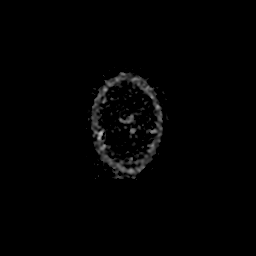

[Series 500: DWI · coronal · 5.0mm · 1.09mm/px · 3 of 36 slices shown (4 of 4)]
[im 1/36]
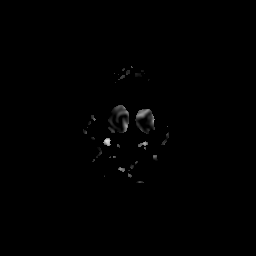
[im 18/36]
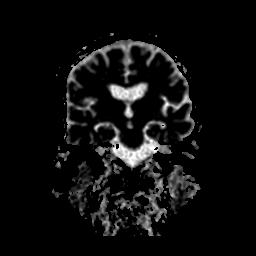
[im 36/36]
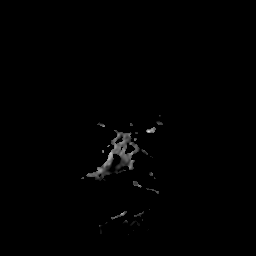

[35 of 48 positions shown; findings below may reference images not displayed]

FINDINGS: Mildly motion degraded examination.

The ventricles and sulci are normal for patient's age. No suspicious
parenchymal signal, mass lesions, mass effect. Scattered
subcentimeter supratentorial white matter T2 hyperintensities. Tiny
old LEFT cerebellar infarcts. No abnormal parenchymal enhancement.
RIGHT frontal developmental venous anomaly. No reduced diffusion to
suggest acute ischemia. No susceptibility artifact to suggest
hemorrhage.

No abnormal extra-axial fluid collections. No extra-axial masses nor
leptomeningeal enhancement. Normal major intracranial vascular flow
voids seen at the skull base.

Status post bilateral ocular lens implants. No suspicious calvarial
bone marrow signal. No abnormal sellar expansion. Craniocervical
junction maintained. Visualized paranasal sinuses and mastoid air
cells are well-aerated.
IMPRESSION: No acute intracranial process.

Involutional changes. Mild chronic small vessel ischemic disease.
Tiny old LEFT cerebellar infarcts.

RIGHT frontal developmental venous anomaly, benign finding.

## 2017-07-05 ENCOUNTER — Telehealth: Payer: Self-pay

## 2017-07-05 ENCOUNTER — Other Ambulatory Visit: Payer: Self-pay

## 2017-07-05 DIAGNOSIS — C8580 Other specified types of non-Hodgkin lymphoma, unspecified site: Secondary | ICD-10-CM

## 2017-07-05 MED ORDER — HYDROCODONE-ACETAMINOPHEN 5-325 MG PO TABS: 1.0000 | ORAL_TABLET | Freq: Every evening | ORAL | 0 refills | Status: DC | PRN

## 2017-07-05 NOTE — Telephone Encounter (Signed)
Called patient wife Early Chars) to let her know hydrocodone prescription is ready for pickup. Voiced understanding.

## 2017-07-11 ENCOUNTER — Telehealth: Payer: Self-pay | Admitting: Oncology

## 2017-07-11 NOTE — Telephone Encounter (Signed)
LVM ADVISING APPT CHG FROM 1/17 TO 1/18 @ 10AM.

## 2017-07-22 ENCOUNTER — Ambulatory Visit: Payer: Self-pay | Admitting: Oncology

## 2017-07-23 ENCOUNTER — Inpatient Hospital Stay: Payer: Medicare Other | Attending: Oncology | Admitting: Oncology

## 2017-07-23 ENCOUNTER — Encounter: Payer: Self-pay | Admitting: Oncology

## 2017-07-23 ENCOUNTER — Telehealth: Payer: Self-pay | Admitting: Oncology

## 2017-07-23 VITALS — BP 121/68 | HR 88 | Temp 97.0°F | Resp 18 | Ht 71.0 in | Wt 175.5 lb

## 2017-07-23 DIAGNOSIS — M21371 Foot drop, right foot: Secondary | ICD-10-CM | POA: Insufficient documentation

## 2017-07-23 DIAGNOSIS — Z86718 Personal history of other venous thrombosis and embolism: Secondary | ICD-10-CM

## 2017-07-23 DIAGNOSIS — C8580 Other specified types of non-Hodgkin lymphoma, unspecified site: Secondary | ICD-10-CM | POA: Diagnosis not present

## 2017-07-23 NOTE — Telephone Encounter (Signed)
Gave avs and calendar for july °

## 2017-07-23 NOTE — Progress Notes (Signed)
[facility] [place] OFFICE PROGRESS NOTE   Diagnosis: Non-Hodgkin's lymphoma  INTERVAL HISTORY:   Mr. Elpers returns as scheduled.  He feels well.  Good appetite.  He is exercising 4-5 days/week.  He continues to have a right foot drop.  He is able to ambulate without a cane at times.  Objective:  Vital signs in last 24 hours:  Blood pressure 121/68, pulse 88, temperature (!) 97 F (36.1 C), temperature source Oral, resp. rate 18, height 5' 11" (1.803 m), weight 175 lb 8 oz (79.6 kg), SpO2 98 %.    HEENT: Neck without mass Lymphatics: No cervical, supraclavicular, axillary, or inguinal nodes Resp: Lungs clear bilaterally Cardio: Regular rate and rhythm GI: No hepatosplenomegaly, no mass, nontender Vascular: No leg edema Neuro: Decreased strength with dorsiflexion at the right foot   Lab Results:  Lab Results  Component Value Date   WBC 8.8 03/23/2017   HGB 15.9 03/23/2017   HCT 48.8 03/23/2017   MCV 101.1 (H) 03/23/2017   PLT 185.0 Platelet estimate appears normal. 03/23/2017   NEUTROABS 3.7 03/23/2017     Medications: I have reviewed the patient's current medications.   Assessment/Plan:  . 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  History of thrombocytopenia predating chemotherapy 3. T7 compression fracture-likely pathologic 4. 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 Right foot pain-likely secondary to nerve compression from the sacral mass, improved 6. Diffuse erythematous rash, appearance consistent with a "drug" rash-resolved 7. History of low-grade fever likely secondary to atelectasis/postop fever versus tumor fever 8. Hepatitis B core antibody positive 10. Admission 04/18/2015 through 04/23/2015 presenting with fever; amylase and lipase elevated.   Abdominal CT showed inflammation in the left upper quadrant and associated loculated fluid collections status post drainage procedure, fluid with high levels of amylase and lipase; cultures negative; question pancreatitis.   04/26/2015 lipase mildly elevated, amylase normal.   CT abdomen/pelvis 05/14/2015 with a persistent 6.5 x 3.8 cm posterior pancreatic tail fluid collection similar in size to the 04/18/2015 CT study. Additional smaller fluid collections in the far lateral left upper quadrant; extensive patchy groundglass opacity at the lung bases, significantly increased; stable expansile lytic lesion in the upper right sacrum. 11. 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 Severe neutropenia 05/13/2015. Likely delayed effect of Rituxan. Resolved 13. Chest x-ray 05/15/2015 with bilateral mid to lower lung interstitial thickening with some hazy lower lung zone groundglass opacity. Partially improved on the chest x-ray 05/24/2015 14. New-onset rapid atrial fibrillation 05/29/2015 15. Admission with acute onset upper abdomen/low anterior chest pain 07/07/2015-most likely related to Neulasta, spontaneously resolved 16. Altered mental status- Potentially related to persistent delirium following a prolonged critical illness , depression, dementia, or psychosis. Brain MRI 10/22/2015 with no acute or metastatic  intracranial abnormality. Stable appearance of the brain since 2016. Mental status improved 10/24/2015. He is followed by neurology   Disposition: Perry Nielsen  remains in clinical remission from non-Hodgkin's lymphoma.  He will return for an office visit in 6 months.  He will contact us in the interim as needed.  He will seek medical attention for symptoms of an infection.  He is up-to-date on vaccines.  15 minutes were spent with the patient today.  The majority of the time was used for counseling and coordination of care.  Betsy Coder, MD  07/23/2017  10:07 AM

## 2017-08-04 ENCOUNTER — Telehealth: Payer: Self-pay

## 2017-08-04 DIAGNOSIS — C8580 Other specified types of non-Hodgkin lymphoma, unspecified site: Secondary | ICD-10-CM

## 2017-08-04 MED ORDER — HYDROCODONE-ACETAMINOPHEN 5-325 MG PO TABS: 1.0000 | ORAL_TABLET | Freq: Every evening | ORAL | 0 refills | Status: DC | PRN

## 2017-08-04 NOTE — Telephone Encounter (Signed)
Spoke with pt wife. Hydrocodone refill prescription available for pick up. Voiced understanding.

## 2017-09-02 ENCOUNTER — Other Ambulatory Visit: Payer: Self-pay | Admitting: *Deleted

## 2017-09-02 DIAGNOSIS — C8580 Other specified types of non-Hodgkin lymphoma, unspecified site: Secondary | ICD-10-CM

## 2017-09-02 MED ORDER — HYDROCODONE-ACETAMINOPHEN 5-325 MG PO TABS: 1.0000 | ORAL_TABLET | Freq: Every evening | ORAL | 0 refills | Status: DC | PRN

## 2017-09-02 NOTE — Telephone Encounter (Signed)
Message from pt's wife requesting Hydrocodone refill for pain related to foot drop. OK, per Dr. Benay Spice. Script will be left in book for pickup.

## 2017-09-30 ENCOUNTER — Telehealth: Payer: Self-pay

## 2017-09-30 DIAGNOSIS — C8580 Other specified types of non-Hodgkin lymphoma, unspecified site: Secondary | ICD-10-CM

## 2017-09-30 MED ORDER — HYDROCODONE-ACETAMINOPHEN 5-325 MG PO TABS: 1.0000 | ORAL_TABLET | Freq: Every evening | ORAL | 0 refills | Status: DC | PRN

## 2017-09-30 NOTE — Telephone Encounter (Signed)
Called to inform that script paper for hydrocodone refill will be available to pick up tomorrow.

## 2017-10-28 ENCOUNTER — Other Ambulatory Visit: Payer: Self-pay

## 2017-10-28 DIAGNOSIS — C8580 Other specified types of non-Hodgkin lymphoma, unspecified site: Secondary | ICD-10-CM

## 2017-10-28 MED ORDER — HYDROCODONE-ACETAMINOPHEN 5-325 MG PO TABS: 1.0000 | ORAL_TABLET | Freq: Every evening | ORAL | 0 refills | Status: DC | PRN

## 2017-11-15 ENCOUNTER — Other Ambulatory Visit: Payer: Self-pay | Admitting: Family Medicine

## 2017-11-15 DIAGNOSIS — F411 Generalized anxiety disorder: Secondary | ICD-10-CM

## 2017-11-26 ENCOUNTER — Other Ambulatory Visit: Payer: Self-pay

## 2017-11-26 DIAGNOSIS — C8580 Other specified types of non-Hodgkin lymphoma, unspecified site: Secondary | ICD-10-CM

## 2017-11-26 MED ORDER — HYDROCODONE-ACETAMINOPHEN 5-325 MG PO TABS: 1.0000 | ORAL_TABLET | Freq: Every evening | ORAL | 0 refills | Status: DC | PRN

## 2017-11-26 NOTE — Telephone Encounter (Signed)
Called to let pt wife know that hydrocodone script paper is available for pickup in office/

## 2017-12-04 ENCOUNTER — Encounter (HOSPITAL_COMMUNITY): Payer: Self-pay

## 2017-12-04 ENCOUNTER — Ambulatory Visit (HOSPITAL_COMMUNITY): Admission: RE | Admit: 2017-12-04 | Payer: Medicare Other | Source: Ambulatory Visit

## 2017-12-04 ENCOUNTER — Other Ambulatory Visit: Payer: Self-pay

## 2017-12-04 ENCOUNTER — Emergency Department (HOSPITAL_COMMUNITY)
Admission: EM | Admit: 2017-12-04 | Discharge: 2017-12-04 | Disposition: A | Discharge status: Home / Self Care | Payer: Medicare Other | Attending: Emergency Medicine | Admitting: Emergency Medicine

## 2017-12-04 ENCOUNTER — Encounter: Payer: Self-pay | Admitting: Urgent Care

## 2017-12-04 ENCOUNTER — Ambulatory Visit (INDEPENDENT_AMBULATORY_CARE_PROVIDER_SITE_OTHER): Payer: Medicare Other | Admitting: Urgent Care

## 2017-12-04 ENCOUNTER — Emergency Department (HOSPITAL_COMMUNITY): Payer: Medicare Other

## 2017-12-04 ENCOUNTER — Ambulatory Visit (INDEPENDENT_AMBULATORY_CARE_PROVIDER_SITE_OTHER): Payer: Medicare Other

## 2017-12-04 VITALS — BP 90/68 | HR 114 | Temp 99.4°F | Resp 28 | Ht 71.0 in | Wt 168.2 lb

## 2017-12-04 DIAGNOSIS — R0789 Other chest pain: Secondary | ICD-10-CM | POA: Diagnosis not present

## 2017-12-04 DIAGNOSIS — R5381 Other malaise: Secondary | ICD-10-CM | POA: Diagnosis not present

## 2017-12-04 DIAGNOSIS — R05 Cough: Secondary | ICD-10-CM

## 2017-12-04 DIAGNOSIS — R059 Cough, unspecified: Secondary | ICD-10-CM

## 2017-12-04 DIAGNOSIS — R490 Dysphonia: Secondary | ICD-10-CM | POA: Diagnosis not present

## 2017-12-04 DIAGNOSIS — D72829 Elevated white blood cell count, unspecified: Secondary | ICD-10-CM | POA: Diagnosis not present

## 2017-12-04 DIAGNOSIS — R799 Abnormal finding of blood chemistry, unspecified: Secondary | ICD-10-CM | POA: Insufficient documentation

## 2017-12-04 DIAGNOSIS — R5383 Other fatigue: Secondary | ICD-10-CM

## 2017-12-04 DIAGNOSIS — Z8579 Personal history of other malignant neoplasms of lymphoid, hematopoietic and related tissues: Secondary | ICD-10-CM

## 2017-12-04 DIAGNOSIS — Z5321 Procedure and treatment not carried out due to patient leaving prior to being seen by health care provider: Secondary | ICD-10-CM | POA: Diagnosis not present

## 2017-12-04 LAB — COMPREHENSIVE METABOLIC PANEL
ALBUMIN: 3.9 g/dL (ref 3.5–5.0)
ALT: 26 U/L (ref 17–63)
ANION GAP: 10 (ref 5–15)
AST: 26 U/L (ref 15–41)
Alkaline Phosphatase: 78 U/L (ref 38–126)
BUN: 20 mg/dL (ref 6–20)
CO2: 26 mmol/L (ref 22–32)
Calcium: 9.2 mg/dL (ref 8.9–10.3)
Chloride: 105 mmol/L (ref 101–111)
Creatinine, Ser: 1.32 mg/dL — ABNORMAL HIGH (ref 0.61–1.24)
GFR calc Af Amer: 59 mL/min — ABNORMAL LOW (ref >60)
GFR calc non Af Amer: 51 mL/min — ABNORMAL LOW (ref >60)
GLUCOSE: 130 mg/dL — AB (ref 65–99)
POTASSIUM: 3.3 mmol/L — AB (ref 3.5–5.1)
SODIUM: 141 mmol/L (ref 135–145)
TOTAL PROTEIN: 7.5 g/dL (ref 6.5–8.1)
Total Bilirubin: 1.7 mg/dL — ABNORMAL HIGH (ref 0.3–1.2)

## 2017-12-04 LAB — POCT CBC
GRANULOCYTE PERCENT: 79.5 % (ref 37–80)
HEMATOCRIT: 41.8 % — AB (ref 43.5–53.7)
HEMOGLOBIN: 14.2 g/dL (ref 14.1–18.1)
Lymph, poc: 2.3 (ref 0.6–3.4)
MCH, POC: 32.5 pg — AB (ref 27–31.2)
MCHC: 33.9 g/dL (ref 31.8–35.4)
MCV: 95.8 fL (ref 80–97)
MID (cbc): 2 — AB (ref 0–0.9)
MPV: 9 fL (ref 0–99.8)
PLATELET COUNT, POC: 160 10*3/uL (ref 142–424)
POC GRANULOCYTE: 16.8 — AB (ref 2–6.9)
POC LYMPH PERCENT: 11.1 %L (ref 10–50)
POC MID %: 9.4 %M (ref 0–12)
RBC: 4.36 M/uL — AB (ref 4.69–6.13)
RDW, POC: 14.3 %
WBC: 21.1 10*3/uL — AB (ref 4.6–10.2)

## 2017-12-04 LAB — CBC WITH DIFFERENTIAL/PLATELET
BASOS ABS: 0 10*3/uL (ref 0.0–0.1)
Basophils Relative: 0 %
EOS PCT: 1 %
Eosinophils Absolute: 0.1 10*3/uL (ref 0.0–0.7)
HEMATOCRIT: 44.1 % (ref 39.0–52.0)
Hemoglobin: 14.8 g/dL (ref 13.0–17.0)
LYMPHS ABS: 2.1 10*3/uL (ref 0.7–4.0)
LYMPHS PCT: 11 %
MCH: 32.2 pg (ref 26.0–34.0)
MCHC: 33.6 g/dL (ref 30.0–36.0)
MCV: 96.1 fL (ref 78.0–100.0)
MONO ABS: 3.1 10*3/uL — AB (ref 0.1–1.0)
Monocytes Relative: 17 %
NEUTROS ABS: 13.5 10*3/uL — AB (ref 1.7–7.7)
Neutrophils Relative %: 71 %
PLATELETS: 177 10*3/uL (ref 150–400)
RBC: 4.59 MIL/uL (ref 4.22–5.81)
RDW: 14.4 % (ref 11.5–15.5)
WBC: 18.7 10*3/uL — ABNORMAL HIGH (ref 4.0–10.5)

## 2017-12-04 LAB — I-STAT CG4 LACTIC ACID, ED: LACTIC ACID, VENOUS: 1.48 mmol/L (ref 0.5–1.9)

## 2017-12-04 MED ORDER — SODIUM CHLORIDE 0.9 % IV SOLN: INTRAVENOUS | Status: DC

## 2017-12-04 NOTE — ED Notes (Signed)
Called patient to room and no answer. 

## 2017-12-04 NOTE — ED Notes (Signed)
Patient called to room and no answer.

## 2017-12-04 NOTE — Patient Instructions (Addendum)
Please report to the ER for an emergent evaluation and consideration for admission. You have leukocytosis of 21.6 with a left shift. I am concerned that you have a serious infection and possibly heading toward sepsis. You may also have another insidious process. However, your ekg and chest x-ray did not help [place] hone in on a diagnosis. So please report to the ER at [facility] or [place] immediately.     Leukocytosis Leukocytosis means that a person has more white blood cells than normal. White blood cells are made in the bone marrow. Bone marrow is the spongy tissue inside of bones. The main job of white blood cells is to fight infection. Having too many white blood cells is a common condition. It can develop as a result of many types of medical problems. What are the causes? This condition may be caused by various problems. In some cases, the bone marrow is normal but it is still making too many white blood cells. This could be the result of:  Infection.  Injury.  Physical stress.  Emotional stress.  Surgery.  Allergic reactions.  Tumors that do not start in the blood or bone marrow.  An inherited disease.  Certain medicines.  Pregnancy and labor.  In other cases, a person may have a bone marrow disorder that is causing the body to make too many white blood cells. Bone marrow disorders include:  Leukemia. This is a type of blood cancer.  Myeloproliferative disorders. These disorders cause blood cells to grow abnormally.  What are the signs or symptoms? Often, this condition causes no symptoms. Some people may have symptoms due to the medical problem that is causing their leukocytosis. These symptoms may include:  Bleeding.  Bruising.  Fever.  Night sweats.  Repeated infections.  Weakness.  Weight loss.  How is this diagnosed? This condition is diagnosed with blood tests. It is often found when blood is tested as part of a routine physical exam. You may have  other tests to help determine why you have too many white blood cells. These tests may include:  A complete blood count (CBC). This test measures all the types of blood cells in your body.  Chest X-rays, urine tests, or other tests to look for signs of infection.  Bone marrow aspiration. For this test, a needle is put into your bone. Cells from the bone marrow are removed through the needle, then they are examined under a microscope.  Other tests on the blood or bone marrow sample.  How is this treated? Usually, treatment is not needed for leukocytosis. However, if a disorder is causing your leukocytosis, it will need to be treated. Treatment may include:  Antibiotic medicine if you have a bacterial infection.  Bone marrow transplant. This treatment replaces your diseased bone marrow with healthy cells that will grow new bone marrow.  Chemotherapy or biological therapies such as the use of antibodies. These treatments may be used to kill cancer cells or to decrease the number of white blood cells.  Follow these instructions at home: Medicines  Take over-the-counter and prescription medicines only as told by your health care provider.  If you were prescribed an antibiotic medicine, take it as told by your health care provider. Do not stop taking the antibiotic even if you start to feel better. Eating and drinking  Eat foods that are low in saturated fats and high in fiber. Eat plenty of fruits and vegetables.  Drink enough fluid to keep your urine clear  or pale yellow.  Limit your intake of caffeine and alcohol. General instructions  Maintain a healthy weight. Ask your health care provider what weight is best for you.  Do 30 minutes of exercise at least 5 times each week. Check with your health care provider before you start a new exercise routine.  Do not use tobacco products, including cigarettes, chewing tobacco, or e-cigarettes. If you need help quitting, ask your health care  provider.  Keep all follow-up visits as told by your health care provider. This is important. Contact a health care provider if:  You feel weak or more tired than usual.  You develop chills, a cough, or nasal congestion.  You have a fever.  You lose weight without trying.  You have night sweats.  You bruise easily. Get help right away if:  You bleed more than normal.  You have chest pain.  You have trouble breathing.  You have uncontrolled nausea or vomiting.  You feel dizzy or light-headed. This information is not intended to replace advice given to you by your health care provider. Make sure you discuss any questions you have with your health care provider. Document Released: 06/11/2011 Document Revised: 11/28/2015 Document Reviewed: 12/24/2014 Elsevier Interactive Patient Education  Arush Schein.

## 2017-12-04 NOTE — ED Notes (Signed)
Patient called to a room and no answer.

## 2017-12-04 NOTE — ED Triage Notes (Signed)
He c/o congested cough plus hoarseness and "not feeling well" x 1 week. He was seen at [facility], who recommend he come here. CXR there negative, with 21k WBC. They sent paperwork indicating pt. To be checked for "sepsis". He is alert and in no distress.

## 2017-12-04 NOTE — Progress Notes (Signed)
MRN: [id number] DOB: 1940-07-21  Subjective:   Perry Carrera. is a 77 y.o. male presenting for 2-3 week history of intermittently productive cough that elicits some chest pain, subjective fever.  Patient has felt fatigued, general malaise, hoarseness of his voice.  Cough has elicited more belly and back discomfort in the past week. Denies sinus pain, sinus congestion, shortness of breath, wheezing, n/v, rashes.  Denies history of asthma.  Denies smoking cigarettes.  He is not currently undergoing any cancer treatment.  He is in remission for lymphoma.  Perry Nielsen has a current medication list which includes the following prescription(s): gabapentin, hydrocodone-acetaminophen, lorazepam, oxymetazoline, polyethyl glycol-propyl glycol, and vardenafil. Also is allergic to antihistamines, diphenhydramine-type.  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 Also  has a past surgical history that includes Shoulder open rotator cuff repair (07/06/05); Knee arthroscopy; Tonsillectomy and adenoidectomy; Transurethral resection of prostate; Cataract extraction w/ intraocular lens  implant, bilateral (Bilateral); and laparoscopic splenectomy (N/A, 03/05/2015). His family history includes Aneurysm in his other; Coronary artery disease in his sister; Heart attack in his other; Heart disease in his brother; Prostate cancer in his father; Pulmonary fibrosis in his mother; Uterine cancer in his mother.   Immunization History  Administered Date(s) Administered  . H1N1 08/07/2008  . HiB (PRP-OMP) 03/01/2015  . Influenza Whole 07/06/2005, 04/06/2007, 04/05/2009  . Influenza, High Dose Seasonal PF  04/01/2016, 03/23/2017  . Influenza,inj,Quad PF,6+ Mos 04/03/2014, 08/16/2015  . Influenza-Unspecified 04/05/2013  . Pneumococcal Conjugate-13 10/17/2013  . Pneumococcal Polysaccharide-23 04/05/2006, 04/05/2008, 05/26/2016  . Td 07/07/2003  . Tdap 03/23/2017  . Tetanus 10/17/2013  . Zoster 08/22/2010   Objective:   Vitals: BP 90/68   Pulse (!) 114   Temp 99.4 F (37.4 C) (Oral)   Resp (!) 28   Ht 5\' 11"  (1.803 m)   Wt 168 lb 4 oz (76.3 kg)   SpO2 94%   BMI 23.47 kg/m   BP Readings from Last 3 Encounters:  12/04/17 90/68  07/23/17 121/68  03/23/17 110/86    Physical Exam  Constitutional: He is oriented to person, place, and time. He appears well-developed and well-nourished.  HENT:  Mucous membranes dry.  Eyes: Right eye exhibits no discharge. Left eye exhibits no discharge. No scleral icterus.  Neck: Normal range of motion. Neck supple.  Cardiovascular: Normal rate, regular rhythm and intact distal pulses. Exam reveals no gallop and no friction rub.  No murmur heard. Pulmonary/Chest: No stridor. No respiratory distress. He has no wheezes. He has no rales.  Decreased lung sounds throughout.  Abdominal: Soft. Bowel sounds are normal. He exhibits no distension and no mass. There is tenderness (mild upper abdominal). There is no guarding.  Neurological: He is alert and oriented to person, place, and time.  Skin: Skin is warm and dry. There is pallor.  Psychiatric:  Appears lethargic.   Dg Chest 2 View  Result Date: 12/04/2017 CLINICAL DATA:  Cough and fever EXAM: CHEST - 2 VIEW COMPARISON:  06/11/2015 FINDINGS: Cardiac shadow is stable. The lungs are well aerated bilaterally with mild chronic interstitial changes stable from the previous exam. No focal infiltrate or sizable effusion is noted. Compression deformity is noted in the midthoracic spine stable from the prior study. No new focal  abnormality is seen. IMPRESSION: Chronic changes without acute abnormality.  Electronically Signed   By: Inez Catalina M.D.   On: 12/04/2017 15:19   Results for orders placed or performed in visit on 12/04/17 (from the past 24 hour(s))  POCT CBC     Status: Abnormal   Collection Time: 12/04/17  4:12 PM  Result Value Ref Range   WBC 21.1 (A) 4.6 - 10.2 K/uL   Lymph, poc 2.3 0.6 - 3.4   POC LYMPH PERCENT 11.1 10 - 50 %L   MID (cbc) 2.0 (A) 0 - 0.9   POC MID % 9.4 0 - 12 %M   POC Granulocyte 16.8 (A) 2 - 6.9   Granulocyte percent 79.5 37 - 80 %G   RBC 4.36 (A) 4.69 - 6.13 M/uL   Hemoglobin 14.2 14.1 - 18.1 g/dL   HCT, POC 41.8 (A) 43.5 - 53.7 %   MCV 95.8 80 - 97 fL   MCH, POC 32.5 (A) 27 - 31.2 pg   MCHC 33.9 31.8 - 35.4 g/dL   RDW, POC 14.3 %   Platelet Count, POC 160 142 - 424 K/uL   MPV 9.0 0 - 99.8 fL    ECG interpretation - Sinus rhythm at 94bpm with non-specific t-wave flattening but no acute findings including atrial fibrillation as seen previousy on ecg's from 05/2015.  Assessment and Plan :     Jaynee Eagles, PA-C [facility] [place] [phone] 12/04/2017  2:56 PM

## 2017-12-04 NOTE — ED Notes (Signed)
Pts wife is sitting in lobby for pt. Pt is laying in car until a room is available

## 2017-12-05 ENCOUNTER — Emergency Department (HOSPITAL_COMMUNITY)
Admission: EM | Admit: 2017-12-05 | Discharge: 2017-12-05 | Disposition: A | Discharge status: Home / Self Care | Payer: Medicare Other | Attending: Emergency Medicine | Admitting: Emergency Medicine

## 2017-12-05 ENCOUNTER — Encounter (HOSPITAL_COMMUNITY): Payer: Self-pay | Admitting: *Deleted

## 2017-12-05 ENCOUNTER — Emergency Department (HOSPITAL_COMMUNITY): Payer: Medicare Other

## 2017-12-05 DIAGNOSIS — R0602 Shortness of breath: Secondary | ICD-10-CM | POA: Insufficient documentation

## 2017-12-05 DIAGNOSIS — J209 Acute bronchitis, unspecified: Secondary | ICD-10-CM | POA: Insufficient documentation

## 2017-12-05 DIAGNOSIS — Z79899 Other long term (current) drug therapy: Secondary | ICD-10-CM | POA: Diagnosis not present

## 2017-12-05 DIAGNOSIS — R05 Cough: Secondary | ICD-10-CM | POA: Diagnosis not present

## 2017-12-05 LAB — COMPREHENSIVE METABOLIC PANEL
ALT: 24 U/L (ref 17–63)
ANION GAP: 13 (ref 5–15)
AST: 23 U/L (ref 15–41)
Albumin: 3.6 g/dL (ref 3.5–5.0)
Alkaline Phosphatase: 70 U/L (ref 38–126)
BUN: 30 mg/dL — ABNORMAL HIGH (ref 6–20)
CO2: 23 mmol/L (ref 22–32)
Calcium: 9 mg/dL (ref 8.9–10.3)
Chloride: 104 mmol/L (ref 101–111)
Creatinine, Ser: 1.49 mg/dL — ABNORMAL HIGH (ref 0.61–1.24)
GFR calc non Af Amer: 44 mL/min — ABNORMAL LOW (ref >60)
GFR, EST AFRICAN AMERICAN: 51 mL/min — AB (ref >60)
Glucose, Bld: 102 mg/dL — ABNORMAL HIGH (ref 65–99)
POTASSIUM: 3.3 mmol/L — AB (ref 3.5–5.1)
SODIUM: 140 mmol/L (ref 135–145)
Total Bilirubin: 0.8 mg/dL (ref 0.3–1.2)
Total Protein: 7 g/dL (ref 6.5–8.1)

## 2017-12-05 LAB — CBC WITH DIFFERENTIAL/PLATELET
Basophils Absolute: 0 10*3/uL (ref 0.0–0.1)
Basophils Relative: 0 %
Eosinophils Absolute: 0.2 10*3/uL (ref 0.0–0.7)
Eosinophils Relative: 1 %
HEMATOCRIT: 41.4 % (ref 39.0–52.0)
HEMOGLOBIN: 14 g/dL (ref 13.0–17.0)
LYMPHS ABS: 2.4 10*3/uL (ref 0.7–4.0)
Lymphocytes Relative: 14 %
MCH: 32.7 pg (ref 26.0–34.0)
MCHC: 33.8 g/dL (ref 30.0–36.0)
MCV: 96.7 fL (ref 78.0–100.0)
MONOS PCT: 20 %
Monocytes Absolute: 3.6 10*3/uL — ABNORMAL HIGH (ref 0.1–1.0)
NEUTROS ABS: 11.4 10*3/uL — AB (ref 1.7–7.7)
NEUTROS PCT: 65 %
Platelets: 186 10*3/uL (ref 150–400)
RBC: 4.28 MIL/uL (ref 4.22–5.81)
RDW: 14.4 % (ref 11.5–15.5)
WBC: 17.7 10*3/uL — AB (ref 4.0–10.5)

## 2017-12-05 LAB — TROPONIN I: Troponin I: 0.03 ng/mL (ref <0.03)

## 2017-12-05 LAB — BRAIN NATRIURETIC PEPTIDE: B Natriuretic Peptide: 258.1 pg/mL — ABNORMAL HIGH (ref 0.0–100.0)

## 2017-12-05 MED ORDER — SODIUM CHLORIDE 0.9 % IV BOLUS
1000.0000 mL | Freq: Once | INTRAVENOUS | Status: AC
  Administered 2017-12-05: 1000 mL via INTRAVENOUS

## 2017-12-05 MED ORDER — BENZONATATE 100 MG PO CAPS: 100.0000 mg | ORAL_CAPSULE | Freq: Three times a day (TID) | ORAL | 0 refills | Status: DC

## 2017-12-05 MED ORDER — AZITHROMYCIN 250 MG PO TABS: 250.0000 mg | ORAL_TABLET | Freq: Every day | ORAL | 0 refills | Status: AC

## 2017-12-05 MED ORDER — SODIUM CHLORIDE 0.9 % IV SOLN
500.0000 mg | Freq: Once | INTRAVENOUS | Status: AC
  Administered 2017-12-05: 500 mg via INTRAVENOUS
  Filled 2017-12-05: qty 500

## 2017-12-05 MED ORDER — CEFUROXIME AXETIL 500 MG PO TABS: 500.0000 mg | ORAL_TABLET | Freq: Two times a day (BID) | ORAL | 0 refills | Status: AC

## 2017-12-05 MED ORDER — SODIUM CHLORIDE 0.9 % IV SOLN
1.0000 g | Freq: Once | INTRAVENOUS | Status: AC
  Administered 2017-12-05: 1 g via INTRAVENOUS
  Filled 2017-12-05: qty 10

## 2017-12-05 NOTE — Discharge Instructions (Signed)
As discussed, with your ongoing cough, hoarseness, you likely have inflammation and/or infection of your lungs, consistent with acute bronchitis. Please take antibiotics as directed, drink plenty of fluids, and be sure to follow-up with your physician.  You may require additional imaging or evaluation if your symptoms do not improve.  Return here for concerning changes in your condition.

## 2017-12-05 NOTE — ED Provider Notes (Addendum)
[facility] DEPT Provider Note   CSN: [id number] Arrival date & time: 12/05/17  0802     History   Chief Complaint Chief Complaint  Patient presents with  . Cough    HPI Perry Nielsen. is a 77 y.o. male.  HPI Presents with concerns of cough, belching, fever. Cough is been present for about 2 weeks, and the patient went to urgent care and then here yesterday, but with perceived wait time, he left prior to evaluation. He is here with a daughter who assists with the HPI. He denies focal pain, confusion, disorientation, syncope. He has had ongoing belching as well, though this is improved today compared to yesterday. He has multiple medical issues including lymphoma, denies history of intrinsic pulmonary disease. Since onset minimal relief with OTC medication. Past Medical History:  Diagnosis Date  . ALLERGIC RHINITIS 08/03/2007  . BACK PAIN 09/18/2009  . 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 CAROTID BRUIT, LEFT 08/07/2008  . 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 Foot drop    rt foot  . GERD 02/18/2007  . HAMMER TOE 08/03/2007  . 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 KNEE PAIN, LEFT, CHRONIC 08/07/2008  . Lymphoma ([place])    right hip  . Rash, skin    torso  . Skin cancer   . Sleeping difficulty     Patient Active Problem List   Diagnosis Date Noted  . Routine general medical examination at a health care facility 03/23/2017  . Need for prophylactic vaccination and inoculation against influenza 03/23/2017  . 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 Foot drop 02/03/2015  . 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 KNEE PAIN, LEFT, CHRONIC 08/07/2008  . CAROTID BRUIT, LEFT 08/07/2008  . Allergic rhinitis 08/03/2007  . 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 GERD 02/18/2007  . BPH associated with nocturia 02/18/2007     Past Surgical History:  Procedure Laterality Date  . CATARACT EXTRACTION W/ INTRAOCULAR LENS  IMPLANT, BILATERAL Bilateral   . KNEE ARTHROSCOPY     left  . 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 SHOULDER OPEN ROTATOR CUFF REPAIR  07/06/05   right  . TONSILLECTOMY AND ADENOIDECTOMY    . TRANSURETHRAL RESECTION OF PROSTATE          Home Medications    Prior to Admission medications   Medication Sig Start Date End Date Taking? Authorizing Provider  gabapentin (NEURONTIN) 100 MG capsule Take 1 capsule (100 mg total) by mouth 3 (three) times daily. 03/23/17   Dorena Cookey, MD  HYDROcodone-acetaminophen (NORCO) 5-325 MG tablet Take 1 tablet by mouth at bedtime as needed for moderate pain. 11/26/17   Ladell Pier, MD  LORazepam (ATIVAN) 0.5 MG tablet TAKE 1 TABLET BY MOUTH TWICE A DAY AS NEEDED 11/16/17   Dorena Cookey, MD  oxymetazoline (AFRIN) 0.05 % nasal spray Place 1 spray into both nostrils 2 (two) times daily as needed for congestion.    [provider]  Polyethyl Glycol-Propyl Glycol (SYSTANE OP) Apply 1 drop to eye daily as needed (dryness).    [provider]  vardenafil (LEVITRA) 20 MG tablet Take 1 tablet (20 mg total) by mouth daily as needed for erectile dysfunction.  03/23/17   Dorena Cookey, MD    Family History Family History  Problem Relation Age of Onset  . Pulmonary fibrosis Mother   . Uterine cancer Mother   . Prostate cancer Father   . Heart disease Brother   . Heart attack Other   . Aneurysm Other        aortic  . Coronary artery disease Sister     Social History Social History   Tobacco Use  . Smoking status: Never Smoker  . Smokeless tobacco: Never Used  Substance Use Topics  . Alcohol use: Yes    Alcohol/week: 0.0 oz    Comment: rarely   . Drug use: No     Allergies   Antihistamines,  diphenhydramine-type   Review of Systems Review of Systems  Constitutional:       Per HPI, otherwise negative  HENT:       Per HPI, otherwise negative  Respiratory:       Per HPI, otherwise negative  Cardiovascular:       Per HPI, otherwise negative  Gastrointestinal: Negative for vomiting.  Endocrine:       Negative aside from HPI  Genitourinary:       Neg aside from HPI   Musculoskeletal:       Per HPI, otherwise negative  Skin: Negative.   Neurological: Negative for syncope.     Physical Exam Updated Vital Signs BP 104/74   Pulse 79   Temp 97.8 F (36.6 C) (Oral)   Resp 15   Ht 6' (1.829 m)   Wt 76.2 kg (168 lb)   SpO2 99%   BMI 22.78 kg/m    Physical Exam  Constitutional: He is oriented to person, place, and time. He appears well-developed. No distress.  HENT:  Head: Normocephalic and atraumatic.  Eyes: Conjunctivae and EOM are normal.  Cardiovascular: Normal rate and regular rhythm.  Pulmonary/Chest: He has decreased breath sounds.  Abdominal: He exhibits no distension.  Musculoskeletal: He exhibits no edema.  Neurological: He is alert and oriented to person, place, and time.  Right foot drop, otherwise unremarkable  Skin: Skin is warm and dry.  Psychiatric: He has a normal mood and affect.  Vitals reviewed.    ED Treatments / Results  Labs (all labs ordered are listed, but only abnormal results are displayed) Labs Reviewed  COMPREHENSIVE METABOLIC PANEL - Abnormal; Notable for the following components:      Result Value   Potassium 3.3 (*)    Glucose, Bld 102 (*)    BUN 30 (*)    Creatinine, Ser 1.49 (*)    GFR calc non Af Amer 44 (*)    GFR calc Af Amer 51 (*)    All other components within normal limits  CBC WITH DIFFERENTIAL/PLATELET - Abnormal; Notable for the following components:   WBC 17.7 (*)    Neutro Abs 11.4 (*)    Monocytes Absolute 3.6 (*)    All other components within normal limits  TROPONIN I - Abnormal; Notable for the  following components:   Troponin I 0.03 (*)    All other components within normal limits  BRAIN NATRIURETIC PEPTIDE - Abnormal; Notable for the following components:   B Natriuretic Peptide 258.1 (*)    All other components within normal limits    EKG EKG Interpretation  Date/Time:  Sunday December 05 2017 11:56:57 EDT Ventricular Rate:  75 PR Interval:    QRS Duration: 98 QT Interval:  448 QTC Calculation: 501  R Axis:   -3 Text Interpretation:  Sinus rhythm Multiple ventricular premature complexes Low voltage, precordial leads Borderline repolarization abnormality Prolonged QT interval Baseline wander Abnormal ekg Confirmed by Carmin Muskrat [phone]) on 12/05/2017 12:10:56 PM   Radiology Dg Chest 2 View  Result Date: 12/05/2017 CLINICAL DATA:  Cough and congestion for the past week. EXAM: CHEST - 2 VIEW COMPARISON:  12/04/2017; 06/11/2015; 03/07/2015; chest CT-02/06/2015 FINDINGS: Grossly unchanged cardiac silhouette and mediastinal contours given slightly reduced lung volumes. Re demonstrated rather diffuse thickening of the pulmonary interstitium with bibasilar reticular opacities. No new focal airspace opacities. No pleural effusion or pneumothorax. No evidence of edema. Grossly unchanged severe (approximately 70%) compression deformity involving a midthoracic vertebral body. No acute osseus abnormalities. IMPRESSION: Findings most suggestive of airways disease, though note, pulmonary fibrosis could have a similar appearance. Clinical correlation is advised. A follow-up chest radiograph in 3 to 4 weeks after treatment is recommended to ensure resolution. If clinical concern persists for pulmonary fibrosis after the resolution of acute symptoms, further evaluation with ILD protocol chest CT could be performed as indicated. Electronically Signed   By: Sandi Mariscal M.D.   On: 12/05/2017 12:22   Dg Chest 2 View  Result Date: 12/04/2017 CLINICAL DATA:  Cough and fever EXAM: CHEST - 2 VIEW  COMPARISON:  06/11/2015 FINDINGS: Cardiac shadow is stable. The lungs are well aerated bilaterally with mild chronic interstitial changes stable from the previous exam. No focal infiltrate or sizable effusion is noted. Compression deformity is noted in the midthoracic spine stable from the prior study. No new focal abnormality is seen. IMPRESSION: Chronic changes without acute abnormality. Electronically Signed   By: Inez Catalina M.D.   On: 12/04/2017 15:19    Procedures Procedures (including critical care time)  Medications Ordered in ED Medications  sodium chloride 0.9 % bolus 1,000 mL (1,000 mLs Intravenous New Bag/Given 12/05/17 1418)  cefTRIAXone (ROCEPHIN) 1 g in sodium chloride 0.9 % 100 mL IVPB (has no administration in time range)  azithromycin (ZITHROMAX) 500 mg in sodium chloride 0.9 % 250 mL IVPB (has no administration in time range)     Initial Impression / Assessment and Plan / ED Course  I have reviewed the triage vital signs and the nursing notes.  Pertinent labs & imaging results that were available during my care of the patient were reviewed by me and considered in my medical decision making (see chart for details).     After initial evaluation I reviewed the patient's chart including remission from lymphoma, with planned repeat visit in 1 month with his oncologist, as well as notation of family history notable for maternal history with pulmonary fibrosis  2:51 PM On repeat exam the patient is awake and alert, afebrile, hemodynamically unremarkable. He continues to have mild cough, hoarse voice. I discussed all findings with him, his granddaughter, and his wife at length, including concern for infection. Labs not obvious for recurrence of lymphoma. Patient will start ceftriaxone, azithromycin, receive IV fluids for minor electrolyte abnormality, elevated creatinine.   Update:, Patient has tolerated initial therapy well.  On given his tolerance of antibiotics, fluids,  absence of distress, absence of evidence for bacteremia, sepsis, patient will be discharged with ongoing antibiotics to follow-up with primary care.  Final Clinical Impressions(s) / ED Diagnoses  Acute bronchitis   Carmin Muskrat, MD 12/05/17 1452   Carmin Muskrat, MD 12/05/17 [phone]

## 2017-12-05 NOTE — ED Triage Notes (Signed)
Pt complains of cough, sneezing, hoarseness for the past couple days. Pt came yesterday but states the wait was too long.

## 2017-12-07 ENCOUNTER — Telehealth: Payer: Self-pay

## 2017-12-07 NOTE — Telephone Encounter (Signed)
Call from wife informing that pt was in the ER with "a bad cough and they determined that it was bronchitis". Pt wife states they were instructed to follow up in 10 days wanted to know if they are to follow up with Dr. Benay Spice. Instrcuted pt ti f/u with PCP, wife voiced understanding.

## 2017-12-08 ENCOUNTER — Telehealth: Payer: Self-pay | Admitting: Oncology

## 2017-12-08 NOTE — Telephone Encounter (Signed)
Spoke w/ patient's wife, Early Chars regarding appt reschedule per GS from 7/18 to 8/1 @ 3:30 pm.  Mailed calendar.

## 2017-12-15 ENCOUNTER — Encounter: Payer: Self-pay | Admitting: Adult Health

## 2017-12-15 ENCOUNTER — Ambulatory Visit (INDEPENDENT_AMBULATORY_CARE_PROVIDER_SITE_OTHER): Payer: Medicare Other | Admitting: Adult Health

## 2017-12-15 VITALS — BP 100/74 | Temp 98.0°F | Wt 166.0 lb

## 2017-12-15 DIAGNOSIS — J209 Acute bronchitis, unspecified: Secondary | ICD-10-CM | POA: Diagnosis not present

## 2017-12-15 NOTE — Patient Instructions (Signed)
It was great seeing you today and I am glad you are feeling better.   At the end of the month, please go to the [facility] office for a repeat chest xray   [place]   Please let me know if you need anything

## 2017-12-15 NOTE — Progress Notes (Signed)
Subjective:    Patient ID: Perry Iha., male    DOB: July 28, 1940, 77 y.o.   MRN: [id number]  HPI 77 year old male who  has a past medical history of ALLERGIC RHINITIS (08/03/2007), BACK PAIN (09/18/2009), BPH associated with nocturia (02/18/2007), CAROTID BRUIT, LEFT (08/07/2008), ERECTILE DYSFUNCTION, MILD (08/07/2008), Foot drop, GERD (02/18/2007), HAMMER TOE (08/03/2007), Hepatitis (1977), KNEE PAIN, LEFT, CHRONIC (08/07/2008), Lymphoma ([place]), Rash, skin, Skin cancer, and Sleeping difficulty.  He is a patient of Dr. Sherren Mocha who I am seeing today for ER follow up. He was seen in the ER 10 days ago. He was diagnosed with acute bronchitis. While in the ER he was started on Ceftriaxone and Azithromycin IV. He was discharged with PO Abx with instructions for primary care f/u.   Chest xray in the ER showed:   IMPRESSION: Findings most suggestive of airways disease, though note, pulmonary fibrosis could have a similar appearance. Clinical correlation is advised. A follow-up chest radiograph in 3 to 4 weeks after treatment is recommended to ensure resolution. If clinical concern persists for pulmonary fibrosis after the resolution of acute symptoms, further evaluation with ILD protocol chest CT could be performed as indicated.  Labs did not show evidence of bacteremia or sepsis.   Today in the office he reports that he has finished his antibiotic cough and feels much improved. He is no longer coughing and does not feel acutely ill. He has not had any fevers or chills.   Review of Systems See HPI   Past Medical History:  Diagnosis Date  . ALLERGIC RHINITIS 08/03/2007  . BACK PAIN 09/18/2009  . 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 CAROTID BRUIT, LEFT 08/07/2008  . 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 Foot drop    rt foot  . GERD 02/18/2007  . HAMMER TOE 08/03/2007  . 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 KNEE PAIN, LEFT,  CHRONIC 08/07/2008  . Lymphoma ([place])    right hip  . Rash, skin    torso  . Skin cancer   . Sleeping difficulty     Social History   Socioeconomic History  . Marital status: Married    Spouse name: Not on file  . Number of children: 2  . Years of education: Not on file  . Highest education level: Not on file  Occupational History  . Occupation: retired    Comment: Pharmacist, hospital, coaching  Social Needs  . Financial resource strain: Not on file  . Food insecurity:    Worry: Not on file    Inability: Not on file  . Transportation needs:    Medical: Not on file    Non-medical: Not on file  Tobacco Use  . Smoking status: Never Smoker  . Smokeless tobacco: Never Used  Substance and Sexual Activity  . Alcohol use: Yes    Alcohol/week: 0.0 oz    Comment: rarely   . Drug use: No  . Sexual activity: Not on file  Lifestyle  . Physical activity:    Days per week: Not on file    Minutes per session: Not on file  . Stress: Not on file  Relationships  . Social connections:    Talks on phone: Not on file    Gets together: Not on file    Attends religious service: Not on file    Active member of club or organization: Not on  file    Attends meetings of clubs or organizations: Not on file    Relationship status: Not on file  . Intimate partner violence:    Fear of current or ex partner: Not on file    Emotionally abused: Not on file    Physically abused: Not on file    Forced sexual activity: Not on file  Other Topics Concern  . Not on file  Social History Narrative   Married.  Very active.      Past Surgical History:  Procedure Laterality Date  . CATARACT EXTRACTION W/ INTRAOCULAR LENS  IMPLANT, BILATERAL Bilateral   . KNEE ARTHROSCOPY     left  . 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 SHOULDER OPEN ROTATOR CUFF REPAIR  07/06/05   right  .  TONSILLECTOMY AND ADENOIDECTOMY    . TRANSURETHRAL RESECTION OF PROSTATE      Family History  Problem Relation Age of Onset  . Pulmonary fibrosis Mother   . Uterine cancer Mother   . Prostate cancer Father   . Heart disease Brother   . Heart attack Other   . Aneurysm Other        aortic  . Coronary artery disease Sister     Allergies  Allergen Reactions  . Antihistamines, Diphenhydramine-Type     CAUSES PROSTATE TO SWELL & as a child had hyperactivity when took Benadryl Pt took Pepcid w/ Benadryl PO and did fine per Pt & his wife    Current Outpatient Medications on File Prior to Visit  Medication Sig Dispense Refill  . benzonatate (TESSALON) 100 MG capsule Take 1 capsule (100 mg total) by mouth every 8 (eight) hours. 21 capsule 0  . gabapentin (NEURONTIN) 100 MG capsule Take 1 capsule (100 mg total) by mouth 3 (three) times daily. 300 capsule 4  . HYDROcodone-acetaminophen (NORCO) 5-325 MG tablet Take 1 tablet by mouth at bedtime as needed for moderate pain. 30 tablet 0  . LORazepam (ATIVAN) 0.5 MG tablet TAKE 1 TABLET BY MOUTH TWICE A DAY AS NEEDED 200 tablet 2  . oxymetazoline (AFRIN) 0.05 % nasal spray Place 1 spray into both nostrils 2 (two) times daily as needed for congestion.    Vladimir Faster Glycol-Propyl Glycol (SYSTANE OP) Apply 1 drop to eye daily as needed (dryness).    . vardenafil (LEVITRA) 20 MG tablet Take 1 tablet (20 mg total) by mouth daily as needed for erectile dysfunction. 20 tablet 10   No current facility-administered medications on file prior to visit.     BP 100/74   Temp 98 F (36.7 C) (Oral)   Wt 166 lb (75.3 kg)   BMI 22.51 kg/m       Objective:   Physical Exam  Constitutional: He is oriented to person, place, and time. He appears well-developed and well-nourished. No distress.  HENT:  Head: Normocephalic and atraumatic.  Right Ear: External ear normal.  Left Ear: External ear normal.  Nose: Nose normal.  Mouth/Throat: Oropharynx is  clear and moist. No oropharyngeal exudate.  Cardiovascular: Normal rate, regular rhythm, normal heart sounds and intact distal pulses. Exam reveals no gallop and no friction rub.  No murmur heard. Pulmonary/Chest: Effort normal and breath sounds normal. No stridor. No respiratory distress. He has no wheezes. He has no rales. He exhibits no tenderness.  Neurological: He is alert and oriented to person, place, and time.  Right foot drop    Skin: Skin is warm. He is not diaphoretic.  Psychiatric: He has a normal mood and affect. His behavior is normal. Judgment and thought content normal.  Nursing note and vitals reviewed.     Assessment & Plan:  1. Acute bronchitis, unspecified organism - Appears to have recovered well. Will get repeat chest xray in a few weeks as advised.  - DG Chest 2 View; Future - Follow up as needed  Dorothyann Peng, AGNP

## 2017-12-28 ENCOUNTER — Other Ambulatory Visit: Payer: Self-pay | Admitting: *Deleted

## 2017-12-28 DIAGNOSIS — C8580 Other specified types of non-Hodgkin lymphoma, unspecified site: Secondary | ICD-10-CM

## 2017-12-28 MED ORDER — HYDROCODONE-ACETAMINOPHEN 5-325 MG PO TABS: 1.0000 | ORAL_TABLET | Freq: Every evening | ORAL | 0 refills | Status: DC | PRN

## 2018-01-03 ENCOUNTER — Other Ambulatory Visit: Payer: Self-pay

## 2018-01-03 DIAGNOSIS — L820 Inflamed seborrheic keratosis: Secondary | ICD-10-CM | POA: Diagnosis not present

## 2018-01-03 DIAGNOSIS — L570 Actinic keratosis: Secondary | ICD-10-CM | POA: Diagnosis not present

## 2018-01-03 DIAGNOSIS — D485 Neoplasm of uncertain behavior of skin: Secondary | ICD-10-CM | POA: Diagnosis not present

## 2018-01-03 DIAGNOSIS — L821 Other seborrheic keratosis: Secondary | ICD-10-CM | POA: Diagnosis not present

## 2018-01-20 ENCOUNTER — Ambulatory Visit: Payer: Self-pay | Admitting: Oncology

## 2018-01-25 ENCOUNTER — Telehealth: Payer: Self-pay | Admitting: *Deleted

## 2018-01-25 ENCOUNTER — Other Ambulatory Visit: Payer: Self-pay | Admitting: *Deleted

## 2018-01-25 DIAGNOSIS — C8580 Other specified types of non-Hodgkin lymphoma, unspecified site: Secondary | ICD-10-CM

## 2018-01-25 MED ORDER — HYDROCODONE-ACETAMINOPHEN 5-325 MG PO TABS: 1.0000 | ORAL_TABLET | Freq: Every evening | ORAL | 0 refills | Status: DC | PRN

## 2018-01-25 NOTE — Telephone Encounter (Signed)
Received call from wife Early Chars requesting refill of Hydrocodone. [name]    Phone     [phone].

## 2018-01-25 NOTE — Telephone Encounter (Signed)
Spoke with wife Early Chars , and informed her that script for Hydrocodone is ready for pick up.  Early Chars voiced understanding.

## 2018-02-03 ENCOUNTER — Inpatient Hospital Stay: Payer: Medicare Other | Attending: Oncology | Admitting: Nurse Practitioner

## 2018-02-03 ENCOUNTER — Telehealth: Payer: Self-pay | Admitting: Nurse Practitioner

## 2018-02-03 ENCOUNTER — Encounter: Payer: Self-pay | Admitting: Nurse Practitioner

## 2018-02-03 VITALS — BP 125/91 | HR 76 | Temp 98.2°F | Resp 18 | Ht 72.0 in | Wt 170.7 lb

## 2018-02-03 DIAGNOSIS — C8580 Other specified types of non-Hodgkin lymphoma, unspecified site: Secondary | ICD-10-CM | POA: Diagnosis not present

## 2018-02-03 DIAGNOSIS — Z86718 Personal history of other venous thrombosis and embolism: Secondary | ICD-10-CM | POA: Diagnosis not present

## 2018-02-03 DIAGNOSIS — M21371 Foot drop, right foot: Secondary | ICD-10-CM

## 2018-02-03 NOTE — Progress Notes (Addendum)
[facility] OFFICE PROGRESS NOTE   Diagnosis: Non-Hodgkin's lymphoma  INTERVAL HISTORY:   Perry Nielsen returns as scheduled.  He overall feels well.  No fevers or sweats.  He has a good energy level.  He swims most days.  He reports appetite and weight are stable.  He continues to have a right foot drop.  The right foot "aches" at nighttime.  He takes Vicodin with overall good relief.  He reports being diagnosed with bronchitis in June.  Symptoms have resolved.  Objective:  Vital signs in last 24 hours:  Blood pressure (!) 125/91, pulse 76, temperature 98.2 F (36.8 C), temperature source Oral, resp. rate 18, height 6' (1.829 m), weight 170 lb 11.2 oz (77.4 kg), SpO2 97 %.    HEENT: Neck without mass. Lymphatics: No palpable cervical, supraclavicular, axillary or inguinal lymph nodes. Resp: Lungs clear bilaterally. Cardio: Regular rate and rhythm. GI: Abdomen soft and nontender.  No hepatomegaly.  No splenomegaly. Vascular: Trace bilateral ankle edema. Neuro: Decreased strength with dorsiflexion at the right foot.   Lab Results:  Lab Results  Component Value Date   WBC 17.7 (H) 12/05/2017   HGB 14.0 12/05/2017   HCT 41.4 12/05/2017   MCV 96.7 12/05/2017   PLT 186 12/05/2017   NEUTROABS 11.4 (H) 12/05/2017    Imaging:  No results found.  Medications: I have reviewed the patient's current medications.  Assessment/Plan: 1. 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 History of thrombocytopenia predating chemotherapy 3. T7 compression fracture-likely pathologic 4. 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 Right foot pain-likely secondary to nerve compression from the sacral mass, improved 6. Diffuse erythematous rash, appearance consistent with a "drug" rash-resolved 7. History of low-grade fever likely secondary to atelectasis/postop fever versus tumor fever 8. Hepatitis B core antibody positive 10. Admission 04/18/2015 through 04/23/2015 presenting with fever; amylase and lipase elevated.   Abdominal CT showed inflammation in the left upper quadrant and associated loculated fluid collections status post drainage procedure, fluid with high levels of amylase and lipase; cultures negative; question pancreatitis.   04/26/2015 lipase mildly elevated, amylase normal.   CT abdomen/pelvis 05/14/2015 with a persistent 6.5 x 3.8 cm posterior pancreatic tail fluid collection similar in size to the 04/18/2015 CT study. Additional smaller fluid collections in the far lateral left upper quadrant; extensive patchy groundglass opacity at the lung bases, significantly increased; stable expansile lytic lesion in the upper right sacrum. 11. 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 Severe neutropenia 05/13/2015. Likely delayed effect of Rituxan. Resolved 13. Chest x-ray 05/15/2015 with bilateral mid to lower lung interstitial thickening with some hazy lower lung zone groundglass opacity. Partially improved on the chest x-ray 05/24/2015 14. New-onset rapid atrial fibrillation 05/29/2015 15. Admission with acute onset upper abdomen/low anterior chest pain 07/07/2015-most likely  related to Neulasta, spontaneously resolved 16. Altered mental status- Potentially related to persistent delirium  following a prolonged critical illness , depression, dementia, or psychosis. Brain MRI 10/22/2015 with no acute or metastatic intracranial abnormality. Stable appearance of the brain since 2016. Mental status improved 10/24/2015. He is followed by neurology    Disposition: Perry Nielsen remains in clinical remission from non-Hodgkin's lymphoma.  He will return for a follow-up visit in 6 months.    We reminded him to contact us or his PCP for the influenza vaccine this fall.  He will continue Vicodin for the right foot pain at nighttime.  Patient seen with Dr. Benay Spice.  Ned Card ANP/GNP-BC   02/03/2018  3:24 PM  This was a shared visit with Ned Card.  Perry Nielsen was interviewed and examined. He is in remission from NHL.  Julieanne Manson, MD

## 2018-02-03 NOTE — Telephone Encounter (Signed)
Scheduled appt per 8/1 los - gave patient AVS and calender per los.  

## 2018-02-21 ENCOUNTER — Telehealth: Payer: Self-pay

## 2018-02-21 DIAGNOSIS — C8580 Other specified types of non-Hodgkin lymphoma, unspecified site: Secondary | ICD-10-CM

## 2018-02-21 MED ORDER — HYDROCODONE-ACETAMINOPHEN 5-325 MG PO TABS: 1.0000 | ORAL_TABLET | Freq: Every evening | ORAL | 0 refills | Status: DC | PRN

## 2018-02-21 NOTE — Telephone Encounter (Signed)
Called to inform that hydrocodone script paper is available for pickup. 90 day supply written per MD.

## 2018-02-24 DIAGNOSIS — H2513 Age-related nuclear cataract, bilateral: Secondary | ICD-10-CM | POA: Diagnosis not present

## 2018-02-28 DIAGNOSIS — H26492 Other secondary cataract, left eye: Secondary | ICD-10-CM | POA: Diagnosis not present

## 2018-02-28 DIAGNOSIS — H26493 Other secondary cataract, bilateral: Secondary | ICD-10-CM | POA: Diagnosis not present

## 2018-03-01 ENCOUNTER — Telehealth: Payer: Self-pay | Admitting: Family Medicine

## 2018-03-01 ENCOUNTER — Telehealth: Payer: Self-pay

## 2018-03-01 ENCOUNTER — Ambulatory Visit (INDEPENDENT_AMBULATORY_CARE_PROVIDER_SITE_OTHER): Payer: Medicare Other

## 2018-03-01 VITALS — BP 124/64 | HR 72 | Ht 72.0 in | Wt 174.0 lb

## 2018-03-01 DIAGNOSIS — F411 Generalized anxiety disorder: Secondary | ICD-10-CM

## 2018-03-01 DIAGNOSIS — Z0000 Encounter for general adult medical examination without abnormal findings: Secondary | ICD-10-CM | POA: Diagnosis not present

## 2018-03-01 NOTE — Telephone Encounter (Signed)
Perry Nielsen in for AWV 08/27  Is requesting refill of Lorazepam 0.5 - take one by mouth bid as needed. The patient states Dr. Sherren Mocha told him he can have tid   Dispense 200 with 2 refills on 11/16/2017   Please advise on refill   2. Also would like vardenafil refilled  Please advise   Send to local pharmacy as given  Wynetta Fines RN

## 2018-03-01 NOTE — Patient Instructions (Addendum)
Perry Nielsen , Thank you for taking time to come for your Medicare Wellness Visit. I appreciate your ongoing commitment to your health goals. Please review the following plan we discussed and let me know if I can assist you in the future.   Check with BCBS plan (state plan) to see if they cover the shingrix in the pharmacy or the doctor's office and at what amount, is there a copay Shingrix is a vaccine for the prevention of Shingles in Adults 50 and older.  If you are on Medicare, the shingrix is covered under your Part D plan, so you will take both of the vaccines in the series at your pharmacy. Please check with your benefits regarding applicable copays or out of pocket expenses.  The Shingrix is given in 2 vaccines approx 8 weeks apart. You must receive the 2nd dose prior to 6 months from receipt of the first. Please have the pharmacist print out you Immunization  dates for our office records   To consider another hearing screen    These are the goals we discussed: Goals    . patient     Keep swimming and staying active        This is a list of the screening recommended for you and due dates:  Health Maintenance  Topic Date Due  . Flu Shot  02/03/2018  . Tetanus Vaccine  03/24/2027  . Pneumonia vaccines  Completed      Fall Prevention in the Home Falls can cause injuries. They can happen to people of all ages. There are many things you can do to make your home safe and to help prevent falls. What can I do on the outside of my home?  Regularly fix the edges of walkways and driveways and fix any cracks.  Remove anything that might make you trip as you walk through a door, such as a raised step or threshold.  Trim any bushes or trees on the path to your home.  Use bright outdoor lighting.  Clear any walking paths of anything that might make someone trip, such as rocks or tools.  Regularly check to see if handrails are loose or broken. Make sure that both sides of any  steps have handrails.  Any raised decks and porches should have guardrails on the edges.  Have any leaves, snow, or ice cleared regularly.  Use sand or salt on walking paths during winter.  Clean up any spills in your garage right away. This includes oil or grease spills. What can I do in the bathroom?  Use night lights.  Install grab bars by the toilet and in the tub and shower. Do not use towel bars as grab bars.  Use non-skid mats or decals in the tub or shower.  If you need to sit down in the shower, use a plastic, non-slip stool.  Keep the floor dry. Clean up any water that spills on the floor as soon as it happens.  Remove soap buildup in the tub or shower regularly.  Attach bath mats securely with double-sided non-slip rug tape.  Do not have throw rugs and other things on the floor that can make you trip. What can I do in the bedroom?  Use night lights.  Make sure that you have a light by your bed that is easy to reach.  Do not use any sheets or blankets that are too big for your bed. They should not hang down onto the floor.  Have a firm  chair that has side arms. You can use this for support while you get dressed.  Do not have throw rugs and other things on the floor that can make you trip. What can I do in the kitchen?  Clean up any spills right away.  Avoid walking on wet floors.  Keep items that you use a lot in easy-to-reach places.  If you need to reach something above you, use a strong step stool that has a grab bar.  Keep electrical cords out of the way.  Do not use floor polish or wax that makes floors slippery. If you must use wax, use non-skid floor wax.  Do not have throw rugs and other things on the floor that can make you trip. What can I do with my stairs?  Do not leave any items on the stairs.  Make sure that there are handrails on both sides of the stairs and use them. Fix handrails that are broken or loose. Make sure that handrails are  as long as the stairways.  Check any carpeting to make sure that it is firmly attached to the stairs. Fix any carpet that is loose or worn.  Avoid having throw rugs at the top or bottom of the stairs. If you do have throw rugs, attach them to the floor with carpet tape.  Make sure that you have a light switch at the top of the stairs and the bottom of the stairs. If you do not have them, ask someone to add them for you. What else can I do to help prevent falls?  Wear shoes that: ? Do not have high heels. ? Have rubber bottoms. ? Are comfortable and fit you well. ? Are closed at the toe. Do not wear sandals.  If you use a stepladder: ? Make sure that it is fully opened. Do not climb a closed stepladder. ? Make sure that both sides of the stepladder are locked into place. ? Ask someone to hold it for you, if possible.  Clearly mark and make sure that you can see: ? Any grab bars or handrails. ? First and last steps. ? Where the edge of each step is.  Use tools that help you move around (mobility aids) if they are needed. These include: ? Canes. ? Walkers. ? Scooters. ? Crutches.  Turn on the lights when you go into a dark area. Replace any light bulbs as soon as they burn out.  Set up your furniture so you have a clear path. Avoid moving your furniture around.  If any of your floors are uneven, fix them.  If there are any pets around you, be aware of where they are.  Review your medicines with your doctor. Some medicines can make you feel dizzy. This can increase your chance of falling. Ask your doctor what other things that you can do to help prevent falls. This information is not intended to replace advice given to you by your health care provider. Make sure you discuss any questions you have with your health care provider. Document Released: 04/18/2009 Document Revised: 11/28/2015 Document Reviewed: 07/27/2014 Elsevier Interactive Patient Education  2018 [place] Maintenance, Male A healthy lifestyle and preventive care is important for your health and wellness. Ask your health care provider about what schedule of regular examinations is right for you. What should I know about weight and diet? Eat a Healthy Diet  Eat plenty of vegetables, fruits, whole grains, low-fat dairy products, and lean  protein.  Do not eat a lot of foods high in solid fats, added sugars, or salt.  Maintain a Healthy Weight Regular exercise can help you achieve or maintain a healthy weight. You should:  Do at least 150 minutes of exercise each week. The exercise should increase your heart rate and make you sweat (moderate-intensity exercise).  Do strength-training exercises at least twice a week.  Watch Your Levels of Cholesterol and Blood Lipids  Have your blood tested for lipids and cholesterol every 5 years starting at 76 years of age. If you are at high risk for heart disease, you should start having your blood tested when you are 76 years old. You may need to have your cholesterol levels checked more often if: ? Your lipid or cholesterol levels are high. ? You are older than 76 years of age. ? You are at high risk for heart disease.  What should I know about cancer screening? Many types of cancers can be detected early and may often be prevented. Lung Cancer  You should be screened every year for lung cancer if: ? You are a current smoker who has smoked for at least 30 years. ? You are a former smoker who has quit within the past 15 years.  Talk to your health care provider about your screening options, when you should start screening, and how often you should be screened.  Colorectal Cancer  Routine colorectal cancer screening usually begins at 76 years of age and should be repeated every 5-10 years until you are 76 years old. You may need to be screened more often if early forms of precancerous polyps or small growths are found. Your health  care provider may recommend screening at an earlier age if you have risk factors for colon cancer.  Your health care provider may recommend using home test kits to check for hidden blood in the stool.  A small camera at the end of a tube can be used to examine your colon (sigmoidoscopy or colonoscopy). This checks for the earliest forms of colorectal cancer.  Prostate and Testicular Cancer  Depending on your age and overall health, your health care provider may do certain tests to screen for prostate and testicular cancer.  Talk to your health care provider about any symptoms or concerns you have about testicular or prostate cancer.  Skin Cancer  Check your skin from head to toe regularly.  Tell your health care provider about any new moles or changes in moles, especially if: ? There is a change in a mole's size, shape, or color. ? You have a mole that is larger than a pencil eraser.  Always use sunscreen. Apply sunscreen liberally and repeat throughout the day.  Protect yourself by wearing long sleeves, pants, a wide-brimmed hat, and sunglasses when outside.  What should I know about heart disease, diabetes, and high blood pressure?  If you are 20-14 years of age, have your blood pressure checked every 3-5 years. If you are 46 years of age or older, have your blood pressure checked every year. You should have your blood pressure measured twice-once when you are at a hospital or clinic, and once when you are not at a hospital or clinic. Record the average of the two measurements. To check your blood pressure when you are not at a hospital or clinic, you can use: ? An automated blood pressure machine at a pharmacy. ? A home blood pressure monitor.  Talk to your health care provider about your  target blood pressure.  If you are between 48-14 years old, ask your health care provider if you should take aspirin to prevent heart disease.  Have regular diabetes screenings by checking your  fasting blood sugar level. ? If you are at a normal weight and have a low risk for diabetes, have this test once every three years after the age of 91. ? If you are overweight and have a high risk for diabetes, consider being tested at a younger age or more often.  A one-time screening for abdominal aortic aneurysm (AAA) by ultrasound is recommended for men aged 25-75 years who are current or former smokers. What should I know about preventing infection? Hepatitis B If you have a higher risk for hepatitis B, you should be screened for this virus. Talk with your health care provider to find out if you are at risk for hepatitis B infection. Hepatitis C Blood testing is recommended for:  Everyone born from [birth year] through 1964.  Anyone with known risk factors for hepatitis C.  Sexually Transmitted Diseases (STDs)  You should be screened each year for STDs including gonorrhea and chlamydia if: ? You are sexually active and are younger than 76 years of age. ? You are older than 76 years of age and your health care provider tells you that you are at risk for this type of infection. ? Your sexual activity has changed since you were last screened and you are at an increased risk for chlamydia or gonorrhea. Ask your health care provider if you are at risk.  Talk with your health care provider about whether you are at high risk of being infected with HIV. Your health care provider may recommend a prescription medicine to help prevent HIV infection.  What else can I do?  Schedule regular health, dental, and eye exams.  Stay current with your vaccines (immunizations).  Do not use any tobacco products, such as cigarettes, chewing tobacco, and e-cigarettes. If you need help quitting, ask your health care provider.  Limit alcohol intake to no more than 2 drinks per day. One drink equals 12 ounces of beer, 5 ounces of wine, or 1 ounces of hard liquor.  Do not use street drugs.  Do not share  needles.  Ask your health care provider for help if you need support or information about quitting drugs.  Tell your health care provider if you often feel depressed.  Tell your health care provider if you have ever been abused or do not feel safe at home. This information is not intended to replace advice given to you by your health care provider. Make sure you discuss any questions you have with your health care provider. Document Released: 12/19/2007 Document Revised: 02/19/2016 Document Reviewed: 03/26/2015 Elsevier Interactive Patient Education  Jaclyn Schein.

## 2018-03-01 NOTE — Progress Notes (Addendum)
Subjective:   Perry Nielsen. is a 77 y.o. male who presents for Medicare Annual/Subsequent preventive examination.  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 INFLUENZA VACCINE  02/03/2018   Flu vaccine not available at this time  PSA 01/2015  Colonoscopy 06/2016   Educated regarding shigrix    meds  [name] was told by Dr. Sherren Mocha to take 3 times a day Vardenafil needs a refill     Objective:    Vitals: BP 124/64   Pulse 72   Ht 6' (1.829 m)   Wt 174 lb (78.9 kg)   SpO2 96%   BMI 23.60 kg/m   Body mass index is 23.6 kg/m.  Advanced Directives 03/01/2018 07/23/2017 09/22/2016 06/17/2016 05/26/2016 01/30/2016 12/11/2015  Does Patient Have a Medical Advance Directive? Yes Yes Yes Yes Yes Yes No  Type of Advance Directive - Living will [place];Living will - [place];Living will [place];Living will -  Does patient want to make changes to medical advance directive? - - - - - - -  Copy of [place] in Chart? - - Yes - Yes - -  Would patient like information on creating a medical advance directive? - - - - - - -    Tobacco Social History   Tobacco Use  Smoking Status Never Smoker  Smokeless Tobacco Never Used     Counseling given:  Yes   Clinical Intake:     Past Medical History:  Diagnosis Date  . ALLERGIC RHINITIS 08/03/2007  . BACK PAIN 09/18/2009  . 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 CAROTID BRUIT, LEFT 08/07/2008  . 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 Foot drop    rt foot  . GERD 02/18/2007  . HAMMER TOE 08/03/2007  . 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 KNEE PAIN, LEFT, CHRONIC 08/07/2008  . Lymphoma ([place])    right hip  . Rash, skin    torso  . Skin cancer   . Sleeping difficulty    Past Surgical History:  Procedure Laterality Date  . CATARACT EXTRACTION W/ INTRAOCULAR LENS  IMPLANT, BILATERAL Bilateral   . KNEE ARTHROSCOPY     left  . 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 SHOULDER OPEN ROTATOR CUFF REPAIR  07/06/05   right  . TONSILLECTOMY AND ADENOIDECTOMY    . TRANSURETHRAL RESECTION OF PROSTATE     Family History  Problem Relation Age of Onset  . Pulmonary fibrosis  Mother   . Uterine cancer Mother   . Prostate cancer Father   . Heart disease Brother   . Heart attack Other   . Aneurysm Other        aortic  . Coronary artery disease Sister    Social History   Socioeconomic History  . Marital status: Married    Spouse name: Not on file  . Number of children: 2  . Years of education: Not on file  . Highest education level: Not on file  Occupational History  . Occupation: retired    Comment: Pharmacist, hospital, coaching  Social Needs  . Financial resource strain: Not on file  . Food insecurity:    Worry: Not on file    Inability: Not on file  . Transportation needs:    Medical: Not on file    Non-medical: Not on file  Tobacco Use  . Smoking status: Never Smoker  . Smokeless tobacco: Never Used  Substance and Sexual Activity  . Alcohol use: Yes    Alcohol/week: 0.0 standard drinks     Comment: rarely   . Drug use: No  . Sexual activity: Not on file  Lifestyle  . Physical activity:    Days per week: Not on file    Minutes per session: Not on file  . Stress: Not on file  Relationships  . Social connections:    Talks on phone: Not on file    Gets together: Not on file    Attends religious service: Not on file    Active member of club or organization: Not on file    Attends meetings of clubs or organizations: Not on file    Relationship status: Not on file  Other Topics Concern  . Not on file  Social History Narrative   Married.  Very active.      Outpatient Encounter Medications as of 03/01/2018  Medication Sig  . gabapentin (NEURONTIN) 100 MG capsule Take 1 capsule (100 mg total) by mouth 3 (three) times daily.  Marland Kitchen HYDROcodone-acetaminophen (NORCO) 5-325 MG tablet Take 1 tablet by mouth at bedtime as needed for moderate pain.  Marland Kitchen LORazepam (ATIVAN) 0.5 MG tablet TAKE 1 TABLET BY MOUTH TWICE A DAY AS NEEDED  . oxymetazoline (AFRIN) 0.05 % nasal spray Place 1 spray into both nostrils 2 (two) times daily as needed for congestion.  Vladimir Faster Glycol-Propyl Glycol (SYSTANE OP) Apply 1 drop to eye daily as needed (dryness).  . vardenafil (LEVITRA) 20 MG tablet Take 1 tablet (20 mg total) by mouth daily as needed for erectile dysfunction.   No facility-administered encounter medications on file as of 03/01/2018.     Activities of Daily Living In your present state of health, do you have any difficulty performing the following activities: 03/01/2018  Hearing? N  Comment mild hearing loss  Vision? N  Difficulty concentrating or making decisions? N  Walking or climbing stairs? N  Dressing or bathing? N  Doing errands, shopping? N  Preparing Food and eating ? N  Using the Toilet? N  In the past six months, have you accidently leaked urine? N  Managing your Finances? N  Housekeeping or managing your Housekeeping? N  Some recent data might be hidden    Patient  Care Team: Dorena Cookey, MD as PCP - General   Assessment:   This is a routine wellness examination for Perry Nielsen.  Exercise Activities and Dietary recommendations Current Exercise Habits: Structured exercise class, Type of exercise: Other - see comments(swimming ),  Time (Minutes): 60, Frequency (Times/Week): 5, Weekly Exercise (Minutes/Week): 300  Goals    . patient     Keep swimming and staying active        Fall Risk Fall Risk  03/01/2018 12/04/2017 04/01/2016 10/23/2014 10/17/2013  Falls in the past year? No No No No No     Depression Screen PHQ 2/9 Scores 03/01/2018 12/04/2017 04/01/2016 10/23/2014  PHQ - 2 Score 0 0 0 0    Cognitive Function   Montreal Cognitive Assessment  10/14/2015  Visuospatial/ Executive (0/5) 2  Naming (0/3) 3  Attention: Read list of digits (0/2) 2  Attention: Read list of letters (0/1) 1  Attention: Serial 7 subtraction starting at 100 (0/3) 3  Language: Repeat phrase (0/2) 2  Language : Fluency (0/1) 1  Abstraction (0/2) 2  Delayed Recall (0/5) 0  Orientation (0/6) 5  Total 21  Adjusted Score (based on education) 21      Immunization History  Administered Date(s) Administered  . H1N1 08/07/2008  . HiB (PRP-OMP) 03/01/2015  . Influenza Whole 07/06/2005, 04/06/2007, 04/05/2009  . Influenza, High Dose Seasonal PF 04/01/2016, 03/23/2017  . Influenza,inj,Quad PF,6+ Mos 04/03/2014, 08/16/2015  . Influenza-Unspecified 04/05/2013  . Pneumococcal Conjugate-13 10/17/2013  . Pneumococcal Polysaccharide-23 04/05/2006, 04/05/2008, 05/26/2016  . Td 07/07/2003  . Tdap 03/23/2017  . Tetanus 10/17/2013  . Zoster 08/22/2010      Screening Tests Health Maintenance  Topic Date Due  . INFLUENZA VACCINE  02/03/2018  . TETANUS/TDAP  03/24/2027  . PNA vac Low Risk Adult  Completed         Plan:      PCP Notes   Health Maintenance Colonoscopy 06/2016   Educated regarding shigrix    meds  [name] was told by Dr. Sherren Mocha to take 3 times a  day Note discussed and sent to [facility] for refill   Vardenafil needs a refill as well  Scheduled to see Dr. Sherren Mocha on on 9/25     Abnormal Screens  no  Referrals  none  Patient concerns; As noted   Nurse Concerns; As noted  Next PCP apt 03/30/3018      I have personally reviewed and noted the following in the patient's chart:   . Medical and social history . Use of alcohol, tobacco or illicit drugs  . Current medications and supplements . Functional ability and status . Nutritional status . Physical activity . Advanced directives . List of other physicians . Hospitalizations, surgeries, and ER visits in previous 12 months . Vitals . Screenings to include cognitive, depression, and falls . Referrals and appointments  In addition, I have reviewed and discussed with patient certain preventive protocols, quality metrics, and best practice recommendations. A written personalized care plan for preventive services as well as general preventive health recommendations were provided to patient.     [id number],VZDGL, RN  03/01/2018   I have reviewed the documentation for the [facility] and [place] provided by the health coach and agree with their documentation. I was immediately available for any questions  Eulas Post MD [facility]

## 2018-03-01 NOTE — Telephone Encounter (Signed)
Copied from [place]. Topic: General - Other >> Mar 01, 2018  4:37 PM Yvette Rack wrote: Reason for CRM: Pt wife Early Chars called in requesting a call back from [place]. Cb# [phone]

## 2018-03-02 MED ORDER — LORAZEPAM 0.5 MG PO TABS: 0.5000 mg | ORAL_TABLET | Freq: Three times a day (TID) | ORAL | 0 refills | Status: DC | PRN

## 2018-03-02 MED ORDER — VARDENAFIL HCL 20 MG PO TABS: 20.0000 mg | ORAL_TABLET | Freq: Every day | ORAL | 10 refills | Status: DC | PRN

## 2018-03-02 NOTE — Telephone Encounter (Signed)
Spoke with Early Chars pt wife stated not to refill more Rx until pt sees Dr Sherren Mocha.

## 2018-03-02 NOTE — Telephone Encounter (Signed)
Refills called/sent in per Dr Sherren Mocha

## 2018-03-30 ENCOUNTER — Ambulatory Visit (INDEPENDENT_AMBULATORY_CARE_PROVIDER_SITE_OTHER): Payer: Medicare Other | Admitting: Family Medicine

## 2018-03-30 ENCOUNTER — Encounter: Payer: Self-pay | Admitting: Family Medicine

## 2018-03-30 VITALS — BP 120/82 | HR 68 | Temp 98.3°F | Ht 71.0 in | Wt 173.9 lb

## 2018-03-30 DIAGNOSIS — Z0000 Encounter for general adult medical examination without abnormal findings: Secondary | ICD-10-CM | POA: Diagnosis not present

## 2018-03-30 DIAGNOSIS — F411 Generalized anxiety disorder: Secondary | ICD-10-CM

## 2018-03-30 DIAGNOSIS — Z23 Encounter for immunization: Secondary | ICD-10-CM

## 2018-03-30 DIAGNOSIS — F528 Other sexual dysfunction not due to a substance or known physiological condition: Secondary | ICD-10-CM

## 2018-03-30 DIAGNOSIS — N401 Enlarged prostate with lower urinary tract symptoms: Secondary | ICD-10-CM | POA: Diagnosis not present

## 2018-03-30 DIAGNOSIS — C7951 Secondary malignant neoplasm of bone: Secondary | ICD-10-CM

## 2018-03-30 DIAGNOSIS — R0789 Other chest pain: Secondary | ICD-10-CM | POA: Diagnosis not present

## 2018-03-30 DIAGNOSIS — R351 Nocturia: Secondary | ICD-10-CM | POA: Diagnosis not present

## 2018-03-30 DIAGNOSIS — C8580 Other specified types of non-Hodgkin lymphoma, unspecified site: Secondary | ICD-10-CM

## 2018-03-30 LAB — HEPATIC FUNCTION PANEL
ALK PHOS: 64 U/L (ref 39–117)
ALT: 20 U/L (ref 0–53)
AST: 20 U/L (ref 0–37)
Albumin: 4.3 g/dL (ref 3.5–5.2)
BILIRUBIN DIRECT: 0.1 mg/dL (ref 0.0–0.3)
BILIRUBIN TOTAL: 0.7 mg/dL (ref 0.2–1.2)
TOTAL PROTEIN: 6.7 g/dL (ref 6.0–8.3)

## 2018-03-30 LAB — POCT URINALYSIS DIPSTICK
Bilirubin, UA: NEGATIVE
Glucose, UA: NEGATIVE
Ketones, UA: NEGATIVE
LEUKOCYTES UA: NEGATIVE
Nitrite, UA: NEGATIVE
PH UA: 6 (ref 5.0–8.0)
Protein, UA: NEGATIVE
RBC UA: NEGATIVE
Spec Grav, UA: 1.015 (ref 1.010–1.025)
UROBILINOGEN UA: 0.2 U/dL

## 2018-03-30 LAB — BASIC METABOLIC PANEL
BUN: 13 mg/dL (ref 6–23)
CALCIUM: 9.4 mg/dL (ref 8.4–10.5)
CO2: 30 mEq/L (ref 19–32)
Chloride: 103 mEq/L (ref 96–112)
Creatinine, Ser: 0.9 mg/dL (ref 0.40–1.50)
GFR: 87.05 mL/min (ref >60.00)
Glucose, Bld: 81 mg/dL (ref 70–99)
Potassium: 4.6 mEq/L (ref 3.5–5.1)
Sodium: 140 mEq/L (ref 135–145)

## 2018-03-30 LAB — CBC WITH DIFFERENTIAL/PLATELET
Basophils Absolute: 0.1 10*3/uL (ref 0.0–0.1)
Basophils Relative: 1.3 % (ref 0.0–3.0)
EOS ABS: 0.7 10*3/uL (ref 0.0–0.7)
EOS PCT: 8 % — AB (ref 0.0–5.0)
HCT: 49.3 % (ref 39.0–52.0)
HEMOGLOBIN: 16.2 g/dL (ref 13.0–17.0)
Lymphocytes Relative: 36 % (ref 12.0–46.0)
Lymphs Abs: 3.1 10*3/uL (ref 0.7–4.0)
MCHC: 32.8 g/dL (ref 30.0–36.0)
MCV: 97.8 fl (ref 78.0–100.0)
MONO ABS: 1.1 10*3/uL — AB (ref 0.1–1.0)
Monocytes Relative: 13.3 % — ABNORMAL HIGH (ref 3.0–12.0)
NEUTROS ABS: 3.5 10*3/uL (ref 1.4–7.7)
Neutrophils Relative %: 41.4 % — ABNORMAL LOW (ref 43.0–77.0)
Platelets: 204 10*3/uL (ref 150.0–400.0)
RBC: 5.05 Mil/uL (ref 4.22–5.81)
RDW: 14.4 % (ref 11.5–15.5)
WBC: 8.5 10*3/uL (ref 4.0–10.5)

## 2018-03-30 LAB — LIPID PANEL
CHOLESTEROL: 241 mg/dL — AB (ref 0–200)
HDL: 46.5 mg/dL (ref >39.00)
LDL Cholesterol: 158 mg/dL — ABNORMAL HIGH (ref 0–99)
NonHDL: 194.02
TRIGLYCERIDES: 182 mg/dL — AB (ref 0.0–149.0)
Total CHOL/HDL Ratio: 5
VLDL: 36.4 mg/dL (ref 0.0–40.0)

## 2018-03-30 LAB — PSA: PSA: 3.64 ng/mL (ref 0.10–4.00)

## 2018-03-30 LAB — TSH: TSH: 5.26 u[IU]/mL — AB (ref 0.35–4.50)

## 2018-03-30 MED ORDER — LORAZEPAM 0.5 MG PO TABS: 0.5000 mg | ORAL_TABLET | Freq: Three times a day (TID) | ORAL | 4 refills | Status: DC | PRN

## 2018-03-30 MED ORDER — VARDENAFIL HCL 20 MG PO TABS: 20.0000 mg | ORAL_TABLET | Freq: Every day | ORAL | 10 refills | Status: DC | PRN

## 2018-03-30 MED ORDER — GABAPENTIN 100 MG PO CAPS: 100.0000 mg | ORAL_CAPSULE | Freq: Three times a day (TID) | ORAL | 4 refills | Status: DC

## 2018-03-30 NOTE — Patient Instructions (Signed)
Labs today........... I will call you if there is anything abnormal  Continue current meds  We discussed the shingles vaccine  Return in 1 year for general physical exam sooner if any problems  Thank you for coming to see me all these years

## 2018-03-30 NOTE — Progress Notes (Signed)
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 Atlee Abide notes.  He sees them now every 6 months because of the possibility of a recurrence.  He is done well except he has persistent pain and weakness in his right lower extremity.  He is not able to play golf.  He swims 6 days a week.  He gets routine eye care, dental care, colonoscopy 2017 was normal.  Vaccinations up-to-date.  Seasonal flu shot given today information given on the shingles.  He takes Neurontin 100 mg 3 times daily, Vicodin 5 mg-325 nightly, Ativan 0.5 3 times daily because of the sequelae of the lymphoma.  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  BP 120/82 (BP Location: Right Arm, Patient Position: Sitting, Cuff Size: Large)   Pulse 68   Temp 98.3 F (36.8 C) (Oral)   Ht 5\' 11"  (1.803 m)   Wt 173 lb 14.4 oz (78.9 kg)   SpO2 95%   BMI 24.25 kg/m  Well-developed well-nourished male no acute distress vital signs stable he is afebrile.  He walks with a cane and has a brace on his right lower extremity.  Examination HEENT were negative neck was supple thyroid is not enlarged no carotid bruits.  Cardiopulmonary exam normal.  Abdominal exam normal.  Genitalia normal circumcised male.  Rectum normal stool guaiac negative prostate smooth nonnodular 1+ BPH.  Extremities normal skin normal peripheral pulses normal except for multiple seborrheic keratosis on his back in the foot drop right lower extremity.  1.  Status post lymphoma........ continue follow-up  with Dr. Atlee Abide every 6 months  2.  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 continue Neurontin, Vicodin and Ativan  3.  Mild ED......Marland Kitchen refill Levitra

## 2018-05-09 DIAGNOSIS — M79675 Pain in left toe(s): Secondary | ICD-10-CM | POA: Diagnosis not present

## 2018-05-09 DIAGNOSIS — L02612 Cutaneous abscess of left foot: Secondary | ICD-10-CM | POA: Diagnosis not present

## 2018-05-09 DIAGNOSIS — L03032 Cellulitis of left toe: Secondary | ICD-10-CM | POA: Diagnosis not present

## 2018-05-18 ENCOUNTER — Other Ambulatory Visit: Payer: Self-pay | Admitting: Nurse Practitioner

## 2018-05-18 ENCOUNTER — Telehealth: Payer: Self-pay | Admitting: *Deleted

## 2018-05-18 DIAGNOSIS — C8580 Other specified types of non-Hodgkin lymphoma, unspecified site: Secondary | ICD-10-CM

## 2018-05-18 MED ORDER — HYDROCODONE-ACETAMINOPHEN 5-325 MG PO TABS: 1.0000 | ORAL_TABLET | Freq: Every evening | ORAL | 0 refills | Status: DC | PRN

## 2018-05-18 NOTE — Telephone Encounter (Signed)
Wife called to request electronic refill on his hydrocodone-apap 5/325.

## 2018-05-23 DIAGNOSIS — L03032 Cellulitis of left toe: Secondary | ICD-10-CM | POA: Diagnosis not present

## 2018-06-13 ENCOUNTER — Other Ambulatory Visit: Payer: Self-pay | Admitting: Family Medicine

## 2018-06-13 NOTE — Telephone Encounter (Unsigned)
Copied from [place] [id number]. Topic: Quick Communication - Rx Refill/Question >> Jun 13, 2018 12:20 PM Percell Belt A wrote: Medication: gabapentin (NEURONTIN) 100 MG capsule [[id number]]   Has the patient contacted their pharmacy? Yes, she stated that they called it in on Dec 4th and the the pharmacy is tell her they have not hear back  (Agent: If no, request that the patient contact the pharmacy for the refill.) (Agent: If yes, when and what did the pharmacy advise?)  Preferred Pharmacy (with phone number or street name): [facility] [place], [address] AT [place] [phone] (Phone)   Agent: Please be advised that RX refills may take up to 3 business days. We ask that you follow-up with your pharmacy.

## 2018-06-15 ENCOUNTER — Other Ambulatory Visit: Payer: Self-pay | Admitting: *Deleted

## 2018-06-15 MED ORDER — GABAPENTIN 100 MG PO CAPS: 100.0000 mg | ORAL_CAPSULE | Freq: Three times a day (TID) | ORAL | 0 refills | Status: DC

## 2018-06-15 NOTE — Telephone Encounter (Signed)
Call to pharmacy- they have received the Rx and are filling it for patient now. Left message for patient regarding the Rx- and to please call office to make appointment with another provider to continue care.

## 2018-06-24 ENCOUNTER — Ambulatory Visit (INDEPENDENT_AMBULATORY_CARE_PROVIDER_SITE_OTHER): Payer: Medicare Other | Admitting: Internal Medicine

## 2018-06-24 ENCOUNTER — Encounter: Payer: Self-pay | Admitting: *Deleted

## 2018-06-24 VITALS — BP 132/84 | HR 76 | Ht 70.5 in | Wt 177.5 lb

## 2018-06-24 DIAGNOSIS — C8580 Other specified types of non-Hodgkin lymphoma, unspecified site: Secondary | ICD-10-CM

## 2018-06-24 DIAGNOSIS — Z79899 Other long term (current) drug therapy: Secondary | ICD-10-CM

## 2018-06-24 DIAGNOSIS — F411 Generalized anxiety disorder: Secondary | ICD-10-CM

## 2018-06-24 DIAGNOSIS — F528 Other sexual dysfunction not due to a substance or known physiological condition: Secondary | ICD-10-CM | POA: Diagnosis not present

## 2018-06-24 DIAGNOSIS — K219 Gastro-esophageal reflux disease without esophagitis: Secondary | ICD-10-CM | POA: Diagnosis not present

## 2018-06-24 DIAGNOSIS — M21371 Foot drop, right foot: Secondary | ICD-10-CM | POA: Diagnosis not present

## 2018-06-24 MED ORDER — LORAZEPAM 0.5 MG PO TABS: 0.5000 mg | ORAL_TABLET | Freq: Three times a day (TID) | ORAL | 2 refills | Status: DC | PRN

## 2018-06-24 NOTE — Patient Instructions (Signed)
-  It was nice meeting you today!  -Schedule follow up in 6 months for routine medical care.

## 2018-06-24 NOTE — Progress Notes (Signed)
Established Patient Office Visit     CC/Reason for Visit: Establish care, follow-up on chronic medical conditions  HPI: Perry Nielsen. is a 77 y.o. adult who is coming in today for the above mentioned reasons.  Due for annual physical exam in September 2020.  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 76-month schedule has developed right foot drop as a consequence.  He has been maintained on gabapentin, lorazepam and hydrocodone for this.  He receives gabapentin and lorazepam through this office, hydrocodone through the oncology office.  He also has a history of erectile dysfunction and takes Levitra.  He also has a history of GERD, however this is well controlled off medication.  He has no acute complaints at today's visit.   Past Medical/Surgical History: Past Medical History:  Diagnosis Date  . ALLERGIC RHINITIS 08/03/2007  . BACK PAIN 09/18/2009  . 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 CAROTID BRUIT, LEFT 08/07/2008  . 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 Foot drop    rt foot  . GERD 02/18/2007  . HAMMER TOE 08/03/2007  . 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 KNEE PAIN, LEFT, CHRONIC 08/07/2008  . Lymphoma ([place])    right hip  . Rash, skin    torso  . Skin cancer   . Sleeping difficulty     Past Surgical History:  Procedure Laterality Date  . CATARACT EXTRACTION W/ INTRAOCULAR LENS  IMPLANT, BILATERAL Bilateral   . KNEE ARTHROSCOPY     left  . 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 SHOULDER OPEN ROTATOR CUFF REPAIR  07/06/05   right  . TONSILLECTOMY AND ADENOIDECTOMY    . TRANSURETHRAL RESECTION OF PROSTATE      Social History:  reports that she has never smoked. She has never used smokeless tobacco. She reports  current alcohol use. She reports that she does not use drugs.  Allergies: Allergies  Allergen Reactions  . Antihistamines, Diphenhydramine-Type     CAUSES PROSTATE TO SWELL & as a child had hyperactivity when took Benadryl Pt took Pepcid w/ Benadryl PO and did fine per Pt & his wife    Family History:  Family History  Problem Relation Age of Onset  . Pulmonary fibrosis Mother   . Uterine cancer Mother   . Prostate cancer Father   . Heart disease Brother   . Heart attack Other   . Aneurysm Other        aortic  . Coronary artery disease Sister      Current Outpatient Medications:  .  gabapentin (NEURONTIN) 100 MG capsule, Take 1 capsule (100 mg total) by mouth 3 (three) times daily. Please schedule with new PCP, Disp: 270 capsule, Rfl: 0 .  HYDROcodone-acetaminophen (NORCO) 5-325 MG tablet, Take 1 tablet by mouth at bedtime as needed for moderate pain., Disp: 90 tablet, Rfl: 0 .  LORazepam (ATIVAN) 0.5 MG tablet, Take 1 tablet (0.5 mg total) by mouth 3 (three) times daily as needed., Disp: 90 tablet, Rfl: 2 .  oxymetazoline (AFRIN) 0.05 % nasal spray, Place 1 spray into both nostrils 2 (two) times daily as needed for congestion., Disp: , Rfl:  .  Polyethyl Glycol-Propyl Glycol (SYSTANE OP), Apply 1 drop to eye  daily as needed (dryness)., Disp: , Rfl:  .  vardenafil (LEVITRA) 20 MG tablet, Take 1 tablet (20 mg total) by mouth daily as needed for erectile dysfunction., Disp: 20 tablet, Rfl: 10  Review of Systems:  Constitutional: Denies fever, chills, diaphoresis, appetite change and fatigue.  HEENT: Denies photophobia, eye pain, redness, hearing loss, ear pain, congestion, sore throat, rhinorrhea, sneezing, mouth sores, trouble swallowing, neck pain, neck stiffness and tinnitus.   Respiratory: Denies SOB, DOE, cough, chest tightness,  and wheezing.   Cardiovascular: Denies chest pain, palpitations and leg swelling.  Gastrointestinal: Denies nausea, vomiting, abdominal pain,  diarrhea, constipation, blood in stool and abdominal distention.  Genitourinary: Denies dysuria, urgency, frequency, hematuria, flank pain and difficulty urinating.  Endocrine: Denies: hot or cold intolerance, sweats, changes in hair or nails, polyuria, polydipsia. Musculoskeletal: Denies myalgias, back pain, joint swelling, arthralgias and gait problem.  Skin: Denies pallor, rash and wound.  Neurological: Denies dizziness, seizures, syncope, weakness, light-headedness, numbness and headaches.  Hematological: Denies adenopathy. Easy bruising, personal or family bleeding history  Psychiatric/Behavioral: Denies suicidal ideation, mood changes, confusion, nervousness, sleep disturbance and agitation    Physical Exam: Vitals:   06/24/18 0829  BP: 132/84  Pulse: 76  SpO2: 96%  Weight: 177 lb 8 oz (80.5 kg)  Height: 5' 10.5" (1.791 m)    Body mass index is 25.11 kg/m.   Constitutional: NAD, calm, comfortable Eyes: PERRL, lids and conjunctivae normal ENMT: Mucous membranes are moist.  Neck: normal, supple, no masses, no thyromegaly Respiratory: clear to auscultation bilaterally, no wheezing, no crackles. Normal respiratory effort. No accessory muscle use.  Cardiovascular: Regular rate and rhythm, no murmurs / rubs / gallops. No extremity edema. 2+ pedal pulses. No carotid bruits.  Abdomen: no tenderness, no masses palpated. No hepatosplenomegaly. Bowel sounds positive.  Musculoskeletal: no clubbing / cyanosis. No joint deformity upper and lower extremities. Good ROM, no contractures. Normal muscle tone.  Neurologic: CN 2-12 grossly intact. Sensation intact, right foot drop  Psychiatric: Normal judgment and insight. Alert and oriented x 3. Normal mood.    Impression and Plan:  Lymphoma, large cell ([place]) -Followed by oncology.  Anxiety state  Right foot drop Encounter for long-term current use of high risk medication -As a consequence from his lymphoma. -Is on Neurontin,  lorazepam and hydrocodone for these issues. -Hydrocodone as prescribed by oncology, benzodiazepine is prescribed through this office as well as Neurontin. -He is requesting alprazolam refill today. -Controlled substance database has been reviewed, he has no red flags, his overdose risk score is 280.  I will go ahead and refill his lorazepam to 0.5 mg 3 times daily for 1 month with 2 refills. -He will sign a benzodiazepine management contract with me today.  ERECTILE DYSFUNCTION, MILD -On Levitra  Gastroesophageal reflux disease without esophagitis -Controlled, not on medications      Patient Instructions  -It was nice meeting you today!  -Schedule follow up in 6 months for routine medical care.     Lelon Frohlich, MD [facility] at [facility]

## 2018-08-10 ENCOUNTER — Other Ambulatory Visit: Payer: Self-pay | Admitting: Nurse Practitioner

## 2018-08-10 DIAGNOSIS — C8580 Other specified types of non-Hodgkin lymphoma, unspecified site: Secondary | ICD-10-CM

## 2018-08-10 MED ORDER — HYDROCODONE-ACETAMINOPHEN 5-325 MG PO TABS: 1.0000 | ORAL_TABLET | Freq: Every evening | ORAL | 0 refills | Status: DC | PRN

## 2018-08-11 ENCOUNTER — Telehealth: Payer: Self-pay | Admitting: Oncology

## 2018-08-11 ENCOUNTER — Inpatient Hospital Stay: Payer: Medicare Other | Attending: Oncology | Admitting: Oncology

## 2018-08-11 VITALS — BP 146/90 | HR 68 | Temp 97.6°F | Resp 19 | Ht 70.5 in | Wt 178.4 lb

## 2018-08-11 DIAGNOSIS — M25551 Pain in right hip: Secondary | ICD-10-CM | POA: Insufficient documentation

## 2018-08-11 DIAGNOSIS — M21371 Foot drop, right foot: Secondary | ICD-10-CM | POA: Insufficient documentation

## 2018-08-11 DIAGNOSIS — C8580 Other specified types of non-Hodgkin lymphoma, unspecified site: Secondary | ICD-10-CM | POA: Diagnosis not present

## 2018-08-11 DIAGNOSIS — Z86718 Personal history of other venous thrombosis and embolism: Secondary | ICD-10-CM | POA: Diagnosis not present

## 2018-08-11 NOTE — Telephone Encounter (Signed)
Scheduled appt per 2/6 .los - gave patient AVS and calender per los   

## 2018-08-11 NOTE — Progress Notes (Signed)
[place] OFFICE PROGRESS NOTE   Diagnosis: Non-Hodgkin's lymphoma  INTERVAL HISTORY:   [name] returns as scheduled.  He feels well.  He swims 4 days/week.  He continues to have a right foot drop.  He has right foot pain, chiefly in the evening.  He takes 1 hydrocodone tablet at night.  He has noted discomfort near the right "hip "for the past several weeks.  This is worse with position change.  No back pain.  Objective:  Vital signs in last 24 hours:  Blood pressure (!) 129/96, pulse 72, temperature 97.6 F (36.4 C), temperature source Oral, resp. rate 19, height 5' 10.5" (1.791 m), weight 178 lb 6.4 oz (80.9 kg), SpO2 100 %.    HEENT: Neck without mass Lymphatics: No cervical, supraclavicular, axillary, or inguinal nodes Resp: Lungs clear bilaterally Cardio: Regular rate and rhythm, 2/6 systolic murmur GI: No hepatomegaly Vascular: No leg edema Neuro: Decreased strength with dorsiflexion at the right foot Musculoskeletal: No pain with motion of the right hip.  No spine tenderness, no tenderness at the right trochanter   Medications: I have reviewed the patient's current medications.  Assessment/Plan: 1. 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 History of thrombocytopenia predating chemotherapy 3. T7 compression fracture-likely pathologic 4. 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 Right foot pain-likely secondary to nerve compression from the sacral mass, improved 6. Diffuse erythematous rash, appearance consistent with a "drug" rash-resolved 7. History of low-grade fever likely secondary to atelectasis/postop fever versus tumor fever 8. Hepatitis B core antibody positive 10. Admission 04/18/2015 through 04/23/2015 presenting with fever; amylase and lipase elevated.   Abdominal CT showed inflammation in the left upper quadrant and associated loculated fluid collections status post drainage procedure, fluid with high levels of amylase and lipase; cultures negative; question pancreatitis.   04/26/2015 lipase mildly elevated, amylase normal.   CT abdomen/pelvis 05/14/2015 with a persistent 6.5 x 3.8 cm posterior pancreatic tail fluid collection similar in size to the 04/18/2015 CT study. Additional smaller fluid collections in the far lateral left upper quadrant; extensive patchy groundglass opacity at the lung bases, significantly increased; stable expansile lytic lesion in the upper right sacrum. 11. 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 Severe neutropenia 05/13/2015. Likely delayed effect of Rituxan. Resolved 13. Chest x-ray 05/15/2015 with bilateral mid to lower lung interstitial thickening with some hazy lower lung zone groundglass opacity. Partially improved on the chest x-ray 05/24/2015 14. New-onset rapid atrial fibrillation 05/29/2015 15. Admission with acute onset upper abdomen/low anterior chest pain 07/07/2015-most likely related to Neulasta, spontaneously resolved 16. Altered mental status- Potentially related to persistent delirium following a prolonged critical illness , depression, dementia, or psychosis. Brain MRI 10/22/2015 with no acute  or metastatic intracranial abnormality. Stable appearance of the brain since 2016. Mental status improved 10/24/2015. He is followed by neurology    Disposition: Perry Nielsen is in  clinical remission from non-Hodgkin's lymphoma.  He will return for an office visit in 6 months.  The right "hip "discomfort is likely related to a benign musculoskeletal condition.  He will seek medical attention for worsening of the pain.  15 minutes were spent with the patient today.  The majority of the time was used for counseling and coordination of care.  Betsy Coder, MD  08/11/2018  3:49 PM

## 2018-09-27 ENCOUNTER — Telehealth: Payer: Self-pay | Admitting: Internal Medicine

## 2018-09-27 NOTE — Telephone Encounter (Signed)
Copied from [place] [id number]. Topic: General - Other >> Sep 27, 2018  3:54 PM Celene Kras A wrote: Reason for CRM: gabapentin (NEURONTIN) 100 MG capsule [[id number]]  needs refill.

## 2018-09-27 NOTE — Telephone Encounter (Signed)
[facility] [address]/ [address]

## 2018-09-28 ENCOUNTER — Other Ambulatory Visit: Payer: Self-pay | Admitting: Internal Medicine

## 2018-09-28 DIAGNOSIS — C8580 Other specified types of non-Hodgkin lymphoma, unspecified site: Secondary | ICD-10-CM

## 2018-09-28 DIAGNOSIS — M21371 Foot drop, right foot: Secondary | ICD-10-CM

## 2018-09-28 MED ORDER — GABAPENTIN 100 MG PO CAPS: 100.0000 mg | ORAL_CAPSULE | Freq: Three times a day (TID) | ORAL | 0 refills | Status: DC

## 2018-09-28 NOTE — Telephone Encounter (Signed)
Rx for gabapentin sent

## 2018-11-04 ENCOUNTER — Other Ambulatory Visit: Payer: Self-pay | Admitting: Nurse Practitioner

## 2018-11-04 ENCOUNTER — Telehealth: Payer: Self-pay | Admitting: *Deleted

## 2018-11-04 ENCOUNTER — Other Ambulatory Visit: Payer: Self-pay | Admitting: Internal Medicine

## 2018-11-04 DIAGNOSIS — C8580 Other specified types of non-Hodgkin lymphoma, unspecified site: Secondary | ICD-10-CM

## 2018-11-04 DIAGNOSIS — M21371 Foot drop, right foot: Secondary | ICD-10-CM

## 2018-11-04 DIAGNOSIS — F411 Generalized anxiety disorder: Secondary | ICD-10-CM

## 2018-11-04 MED ORDER — LORAZEPAM 0.5 MG PO TABS: 0.5000 mg | ORAL_TABLET | Freq: Three times a day (TID) | ORAL | 0 refills | Status: DC | PRN

## 2018-11-04 MED ORDER — HYDROCODONE-ACETAMINOPHEN 5-325 MG PO TABS: 1.0000 | ORAL_TABLET | Freq: Every evening | ORAL | 0 refills | Status: DC | PRN

## 2018-11-04 NOTE — Telephone Encounter (Signed)
Patient has scheduled a telephone visit 11/08/2018.  He take his last Ativan tonight.

## 2018-11-04 NOTE — Telephone Encounter (Signed)
Copied from [place] [id number]. Topic: Quick Communication - Rx Refill/Question >> Nov 04, 2018  1:06 PM Selinda Flavin B, NT wrote: Medication: LORazepam (ATIVAN) 0.5 MG tablet  Has the patient contacted their pharmacy? yes (Agent: If no, request that the patient contact the pharmacy for the refill.) (Agent: If yes, when and what did the pharmacy advise?)  Preferred Pharmacy (with phone number or street name): [facility] [place], [address] AT [address]: Please be advised that RX refills may take up to 3 business days. We ask that you follow-up with your pharmacy.

## 2018-11-04 NOTE — Telephone Encounter (Signed)
E visit please since I haven't seen him since Dec. If he doesn't have enough to last thru weekend, can refill enough for 1 week.

## 2018-11-04 NOTE — Telephone Encounter (Signed)
Patient's wife called requesting a refill of Hydrocodone to be sent to [facility] on [address]

## 2018-11-08 ENCOUNTER — Ambulatory Visit (INDEPENDENT_AMBULATORY_CARE_PROVIDER_SITE_OTHER): Payer: Medicare Other | Admitting: Internal Medicine

## 2018-11-08 ENCOUNTER — Other Ambulatory Visit: Payer: Self-pay

## 2018-11-08 DIAGNOSIS — K219 Gastro-esophageal reflux disease without esophagitis: Secondary | ICD-10-CM

## 2018-11-08 DIAGNOSIS — C8580 Other specified types of non-Hodgkin lymphoma, unspecified site: Secondary | ICD-10-CM | POA: Diagnosis not present

## 2018-11-08 DIAGNOSIS — M21371 Foot drop, right foot: Secondary | ICD-10-CM | POA: Diagnosis not present

## 2018-11-08 DIAGNOSIS — F411 Generalized anxiety disorder: Secondary | ICD-10-CM

## 2018-11-08 MED ORDER — LORAZEPAM 0.5 MG PO TABS: 0.5000 mg | ORAL_TABLET | Freq: Two times a day (BID) | ORAL | 2 refills | Status: DC | PRN

## 2018-11-08 NOTE — Progress Notes (Signed)
Virtual Visit via Telephone Note  I connected with Perry Nielsen. on 11/08/18 at  2:30 PM EDT by telephone and verified that I am speaking with the correct person using two identifiers.   I discussed the limitations, risks, security and privacy concerns of performing an evaluation and management service by telephone and the availability of in person appointments. I also discussed with the patient that there may be a patient responsible charge related to this service. The patient expressed understanding and agreed to proceed.  We initially attempted to connect via video chat but were unable to due to technical difficulties on the patient's end, so we converted this visit to a phone visit.  Location patient: home Location provider: work office Participants present for the call: patient, provider Patient did not have a visit in the prior 7 days to address this/these issue(s).   History of Present Illness:  He has scheduled this visit mainly for medication refills, he needs a refill of his Ativan.  This was last filled in December, he was given an additional 9 tablets on May 1 to last until this appointment.  He has a history of lymphoma followed by oncology, right foot drop, anxiety and GERD.  He receives hydrocodone through oncology.  He states he is "bored" during the COVID 19 pandemic.  He is trying to remain physically active by walking 15 to 20 minutes every day around the neighborhood, however he has a foot drop and walking is difficult.  He is used to swimming 5 days a week but has been unable to.  He takes Ativan twice a day for anxiety.   Observations/Objective: Patient sounds cheerful and well on the phone. I do not appreciate any increased work of breathing. Speech and thought processing are grossly intact. Patient reported vitals: None reported   Current Outpatient Medications:  .  gabapentin (NEURONTIN) 100 MG capsule, Take 1 capsule (100 mg total) by mouth 3 (three)  times daily. Please schedule with new PCP, Disp: 270 capsule, Rfl: 0 .  HYDROcodone-acetaminophen (NORCO) 5-325 MG tablet, Take 1 tablet by mouth at bedtime as needed for moderate pain., Disp: 90 tablet, Rfl: 0 .  LORazepam (ATIVAN) 0.5 MG tablet, Take 1 tablet (0.5 mg total) by mouth 2 (two) times daily as needed., Disp: 60 tablet, Rfl: 2 .  oxymetazoline (AFRIN) 0.05 % nasal spray, Place 1 spray into both nostrils 2 (two) times daily as needed for congestion., Disp: , Rfl:  .  Polyethyl Glycol-Propyl Glycol (SYSTANE OP), Apply 1 drop to eye daily as needed (dryness)., Disp: , Rfl:  .  vardenafil (LEVITRA) 20 MG tablet, Take 1 tablet (20 mg total) by mouth daily as needed for erectile dysfunction., Disp: 20 tablet, Rfl: 10  Review of Systems:  Constitutional: Denies fever, chills, diaphoresis, appetite change and fatigue.  HEENT: Denies photophobia, eye pain, redness, hearing loss, ear pain, congestion, sore throat, rhinorrhea, sneezing, mouth sores, trouble swallowing, neck pain, neck stiffness and tinnitus.   Respiratory: Denies SOB, DOE, cough, chest tightness,  and wheezing.   Cardiovascular: Denies chest pain, palpitations and leg swelling.  Gastrointestinal: Denies nausea, vomiting, abdominal pain, diarrhea, constipation, blood in stool and abdominal distention.  Genitourinary: Denies dysuria, urgency, frequency, hematuria, flank pain and difficulty urinating.  Endocrine: Denies: hot or cold intolerance, sweats, changes in hair or nails, polyuria, polydipsia. Musculoskeletal: Denies myalgias, back pain, joint swelling, arthralgias and gait problem.  Skin: Denies pallor, rash and wound.  Neurological: Denies dizziness, seizures, syncope, weakness,  light-headedness, numbness and headaches.  Hematological: Denies adenopathy. Easy bruising, personal or family bleeding history  Psychiatric/Behavioral: Denies suicidal ideation, mood changes, confusion, nervousness, sleep disturbance and  agitation   Assessment and Plan:  Lymphoma, large cell (Perry Nielsen) -Followed by oncology.  Anxiety state  -PDMP has been reviewed, overdose risk score is 260, he has no red flags. -His active prescriptions include Ativan as well as hydrocodone this last one prescribed by oncology. -Will refill Ativan today 60 tablets a month with 2 refills.  Right foot drop  -Noted  Gastroesophageal reflux disease without esophagitis -Asymptomatic, not currently on medications.    I discussed the assessment and treatment plan with the patient. The patient was provided an opportunity to ask questions and all were answered. The patient agreed with the plan and demonstrated an understanding of the instructions.   The patient was advised to call back or seek an in-person evaluation if the symptoms worsen or if the condition fails to improve as anticipated.  I provided 15 minutes of non-face-to-face time during this encounter.   Lelon Frohlich, MD [facility] at [facility]

## 2018-11-29 DIAGNOSIS — Z961 Presence of intraocular lens: Secondary | ICD-10-CM | POA: Diagnosis not present

## 2018-11-29 DIAGNOSIS — H26491 Other secondary cataract, right eye: Secondary | ICD-10-CM | POA: Diagnosis not present

## 2019-01-03 ENCOUNTER — Other Ambulatory Visit: Payer: Self-pay | Admitting: Internal Medicine

## 2019-01-03 DIAGNOSIS — C8580 Other specified types of non-Hodgkin lymphoma, unspecified site: Secondary | ICD-10-CM

## 2019-01-03 DIAGNOSIS — M21371 Foot drop, right foot: Secondary | ICD-10-CM

## 2019-01-04 DIAGNOSIS — D229 Melanocytic nevi, unspecified: Secondary | ICD-10-CM | POA: Diagnosis not present

## 2019-01-04 DIAGNOSIS — L821 Other seborrheic keratosis: Secondary | ICD-10-CM | POA: Diagnosis not present

## 2019-01-05 DIAGNOSIS — Z1159 Encounter for screening for other viral diseases: Secondary | ICD-10-CM | POA: Diagnosis not present

## 2019-01-30 ENCOUNTER — Other Ambulatory Visit: Payer: Self-pay | Admitting: Nurse Practitioner

## 2019-01-30 DIAGNOSIS — C8580 Other specified types of non-Hodgkin lymphoma, unspecified site: Secondary | ICD-10-CM

## 2019-01-30 MED ORDER — HYDROCODONE-ACETAMINOPHEN 5-325 MG PO TABS: 1.0000 | ORAL_TABLET | Freq: Every evening | ORAL | 0 refills | Status: DC | PRN

## 2019-02-01 ENCOUNTER — Other Ambulatory Visit: Payer: Self-pay | Admitting: Internal Medicine

## 2019-02-01 DIAGNOSIS — F411 Generalized anxiety disorder: Secondary | ICD-10-CM

## 2019-02-01 DIAGNOSIS — M21371 Foot drop, right foot: Secondary | ICD-10-CM

## 2019-02-06 ENCOUNTER — Other Ambulatory Visit: Payer: Self-pay | Admitting: Internal Medicine

## 2019-02-06 DIAGNOSIS — F411 Generalized anxiety disorder: Secondary | ICD-10-CM

## 2019-02-06 DIAGNOSIS — M21371 Foot drop, right foot: Secondary | ICD-10-CM

## 2019-02-07 ENCOUNTER — Other Ambulatory Visit: Payer: Self-pay

## 2019-02-07 ENCOUNTER — Inpatient Hospital Stay: Payer: Medicare Other | Attending: Oncology | Admitting: Oncology

## 2019-02-07 VITALS — BP 120/89 | HR 85 | Temp 98.9°F | Resp 18 | Ht 70.5 in | Wt 171.3 lb

## 2019-02-07 DIAGNOSIS — K5900 Constipation, unspecified: Secondary | ICD-10-CM | POA: Diagnosis not present

## 2019-02-07 DIAGNOSIS — C8580 Other specified types of non-Hodgkin lymphoma, unspecified site: Secondary | ICD-10-CM | POA: Diagnosis not present

## 2019-02-07 DIAGNOSIS — Z86718 Personal history of other venous thrombosis and embolism: Secondary | ICD-10-CM | POA: Diagnosis not present

## 2019-02-07 DIAGNOSIS — M21371 Foot drop, right foot: Secondary | ICD-10-CM | POA: Insufficient documentation

## 2019-02-07 NOTE — Progress Notes (Signed)
[place] OFFICE PROGRESS NOTE   Diagnosis: Non-Hodgkin lymphoma  INTERVAL HISTORY:   Perry Nielsen returns for scheduled visit.  He feels well.  He is swimming 6 days/week.  He reports constipation for the past 2 weeks.  No bleeding.  He started Metamucil.  He continues to have a right foot drop and wears a brace.  He ambulates with a cane.  He takes hydrocodone, gabapentin, and lorazepam in the evening.  He relates weight loss to eating lunch late in the day and not eating as much at night.  He is also exercising more regularly.  Objective:  Vital signs in last 24 hours:  Blood pressure 120/89, pulse 85, temperature 98.9 F (37.2 C), temperature source Oral, resp. rate 18, height 5' 10.5" (1.791 m), weight 171 lb 4.8 oz (77.7 kg), SpO2 97 %.   Limited physical examination secondary to distancing with the COVID pandemic HEENT: Neck without mass Lymphatics: No cervical, supraclavicular, axillary, or inguinal nodes GI: No hepatosplenomegaly, no mass, nontender Vascular: No leg edema Neuro: 3/5 strength with dorsiflexion of the right foot   Lab Results:  Lab Results  Component Value Date   WBC 8.5 03/30/2018   HGB 16.2 03/30/2018   HCT 49.3 03/30/2018   MCV 97.8 03/30/2018   PLT 204.0 03/30/2018   NEUTROABS 3.5 03/30/2018    CMP  Lab Results  Component Value Date   NA 140 03/30/2018   K 4.6 03/30/2018   CL 103 03/30/2018   CO2 30 03/30/2018   GLUCOSE 81 03/30/2018   BUN 13 03/30/2018   CREATININE 0.90 03/30/2018   CALCIUM 9.4 03/30/2018   PROT 6.7 03/30/2018   ALBUMIN 4.3 03/30/2018   AST 20 03/30/2018   ALT 20 03/30/2018   ALKPHOS 64 03/30/2018   BILITOT 0.7 03/30/2018   GFRNONAA 44 (L) 12/05/2017   GFRAA 51 (L) 12/05/2017     Medications: I have reviewed the patient's current medications.   Assessment/Plan:  . 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 History of thrombocytopenia predating chemotherapy 3. T7 compression fracture-likely pathologic 4. 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 Right foot pain-likely secondary to nerve compression from the sacral mass, improved 6. Diffuse erythematous rash, appearance consistent with a "drug" rash-resolved 7. History of low-grade fever likely secondary to atelectasis/postop fever versus tumor fever 8. Hepatitis B core antibody positive 10. Admission 04/18/2015 through 04/23/2015 presenting with fever; amylase and lipase elevated.   Abdominal CT showed inflammation in the left upper quadrant and associated loculated fluid collections status post drainage procedure, fluid with high levels of amylase and lipase; cultures negative; question pancreatitis.   04/26/2015 lipase mildly elevated, amylase normal.   CT abdomen/pelvis 05/14/2015 with a persistent 6.5 x 3.8 cm posterior pancreatic tail fluid collection similar in size to the 04/18/2015 CT study. Additional smaller fluid collections in the far lateral left upper quadrant; extensive patchy groundglass  opacity at the lung bases, significantly increased; stable expansile lytic lesion in the upper right sacrum. 11. Bilateral lower extremity DVTs on a Doppler  04/18/2015 12. Severe neutropenia 05/13/2015. Likely delayed effect of Rituxan. Resolved 13. Chest x-ray 05/15/2015 with bilateral mid to lower lung interstitial thickening with some hazy lower lung zone groundglass opacity. Partially improved on the chest x-ray 05/24/2015 14. New-onset rapid atrial fibrillation 05/29/2015 15. Admission with acute onset upper abdomen/low anterior chest pain 07/07/2015-most likely related to Neulasta, spontaneously resolved 16. Altered mental status- Potentially related to persistent delirium following a prolonged critical illness , depression, dementia, or psychosis. Brain MRI 10/22/2015 with no acute or metastatic intracranial abnormality. Stable appearance of the brain since 2016. Mental status improved 10/24/2015. He is followed by neurology    Disposition: Perry Nielsen remains in remission from non-Hodgkin's lymphoma.  He takes gabapentin and hydrocodone in the evening for discomfort in the right foot. We will refer him to Ortho to see if a different foot brace will improve his ambulation.  He will return for an office visit in 6 months.  Betsy Coder, MD  02/07/2019  3:57 PM

## 2019-02-21 ENCOUNTER — Telehealth: Payer: Self-pay | Admitting: *Deleted

## 2019-02-21 NOTE — Telephone Encounter (Signed)
Script written and faxed to [facility] [phone] (phone [phone]) for his new brace fitting for right foot drop.  Location: [address] in [place]. Left VM for patient to call back for phone # and location of office to make appointment.

## 2019-04-02 ENCOUNTER — Other Ambulatory Visit: Payer: Self-pay | Admitting: Internal Medicine

## 2019-04-02 DIAGNOSIS — M21371 Foot drop, right foot: Secondary | ICD-10-CM

## 2019-04-02 DIAGNOSIS — F411 Generalized anxiety disorder: Secondary | ICD-10-CM

## 2019-04-24 ENCOUNTER — Other Ambulatory Visit: Payer: Self-pay | Admitting: Nurse Practitioner

## 2019-04-24 DIAGNOSIS — C8580 Other specified types of non-Hodgkin lymphoma, unspecified site: Secondary | ICD-10-CM

## 2019-04-24 MED ORDER — HYDROCODONE-ACETAMINOPHEN 5-325 MG PO TABS: 1.0000 | ORAL_TABLET | Freq: Every evening | ORAL | 0 refills | Status: DC | PRN

## 2019-04-25 ENCOUNTER — Other Ambulatory Visit: Payer: Self-pay | Admitting: Oncology

## 2019-04-25 ENCOUNTER — Telehealth: Payer: Self-pay | Admitting: *Deleted

## 2019-04-25 DIAGNOSIS — C8580 Other specified types of non-Hodgkin lymphoma, unspecified site: Secondary | ICD-10-CM

## 2019-04-25 MED ORDER — HYDROCODONE-ACETAMINOPHEN 5-325 MG PO TABS: 1.0000 | ORAL_TABLET | Freq: Two times a day (BID) | ORAL | 0 refills | Status: DC | PRN

## 2019-04-25 NOTE — Telephone Encounter (Signed)
Pharmacy will not allow them to fill the Norco script until 04/27/19 and he is out of medication. She reports he has taken pain med twice daily on occasion when his leg pain is worse. Informed her this is why he ran out early and pharmacy can only fill based on directions MD ordered, which was daily. She requests 30 day supply and change directions to take #2/day if needed.

## 2019-05-11 ENCOUNTER — Other Ambulatory Visit: Payer: Self-pay | Admitting: Physician Assistant

## 2019-05-23 ENCOUNTER — Telehealth: Payer: Self-pay | Admitting: Internal Medicine

## 2019-05-23 ENCOUNTER — Other Ambulatory Visit: Payer: Self-pay | Admitting: *Deleted

## 2019-05-23 MED ORDER — VARDENAFIL HCL 20 MG PO TABS: 20.0000 mg | ORAL_TABLET | Freq: Every day | ORAL | 2 refills | Status: DC | PRN

## 2019-05-23 NOTE — Telephone Encounter (Signed)
Requested medication (s) are due for refill today: yes  Requested medication (s) are on the active medication list: yes  Last refill:  03/30/2018  Future visit scheduled: no  Notes to clinic:  Last filled by different provider Review for refill   Requested Prescriptions  Pending Prescriptions Disp Refills   vardenafil (LEVITRA) 20 MG tablet 20 tablet 10    Sig: Take 1 tablet (20 mg total) by mouth daily as needed for erectile dysfunction.     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

## 2019-05-23 NOTE — Telephone Encounter (Signed)
Medication Refill - Medication:  vardenafil (LEVITRA) 20 MG tablet   Has the patient contacted their pharmacy?  Yes advised to call office.   Preferred Pharmacy (with phone number or street name):  [place] phone [phone]  fax [phone]  Agent: Please be advised that RX refills may take up to 3 business days. We ask that you follow-up with your pharmacy.

## 2019-05-29 NOTE — Telephone Encounter (Signed)
On 11.17.20 Pt requested refill for :  vardenafil (LEVITRA) 20 MG tablet   Be sent to  Preferred Pharmacy   [facility] phone [phone]   fax [phone]  Please advise

## 2019-05-30 MED ORDER — VARDENAFIL HCL 20 MG PO TABS: 20.0000 mg | ORAL_TABLET | Freq: Every day | ORAL | 2 refills | Status: DC | PRN

## 2019-05-30 NOTE — Telephone Encounter (Signed)
Rx faxed and confirmed to [facility]

## 2019-05-30 NOTE — Addendum Note (Signed)
Addended by: Westley Hummer B on: 05/30/2019 01:27 PM   Modules accepted: Orders

## 2019-05-30 NOTE — Addendum Note (Signed)
Addended by: Westley Hummer B on: 05/30/2019 07:23 AM   Modules accepted: Orders

## 2019-05-30 NOTE — Telephone Encounter (Signed)
Patient would like a call regarding his medication that was originally sent to the wrong pharmacy.

## 2019-05-31 ENCOUNTER — Other Ambulatory Visit: Payer: Self-pay | Admitting: Internal Medicine

## 2019-05-31 ENCOUNTER — Other Ambulatory Visit: Payer: Self-pay | Admitting: *Deleted

## 2019-05-31 DIAGNOSIS — M21371 Foot drop, right foot: Secondary | ICD-10-CM

## 2019-05-31 DIAGNOSIS — F411 Generalized anxiety disorder: Secondary | ICD-10-CM

## 2019-05-31 DIAGNOSIS — F528 Other sexual dysfunction not due to a substance or known physiological condition: Secondary | ICD-10-CM

## 2019-05-31 MED ORDER — VARDENAFIL HCL 20 MG PO TABS: 20.0000 mg | ORAL_TABLET | Freq: Every day | ORAL | 2 refills | Status: DC | PRN

## 2019-05-31 NOTE — Telephone Encounter (Signed)
Pt wife called in and stated that [facility] told her that the fax was to dark to read and they would need it refaxed lighter or she would like to know if she could pick up a paper script ?   Best number  [phone]

## 2019-05-31 NOTE — Telephone Encounter (Signed)
Signed.

## 2019-05-31 NOTE — Telephone Encounter (Signed)
Rx printed off for Dr. Jerilee Hoh signature. Wife will come pick up Rx.

## 2019-06-07 NOTE — Telephone Encounter (Signed)
Last filled 04/04/19 Last OV 11/08/2018

## 2019-06-28 ENCOUNTER — Other Ambulatory Visit: Payer: Self-pay | Admitting: Physician Assistant

## 2019-07-11 ENCOUNTER — Other Ambulatory Visit: Payer: Self-pay | Admitting: Internal Medicine

## 2019-07-11 DIAGNOSIS — M21371 Foot drop, right foot: Secondary | ICD-10-CM

## 2019-07-11 DIAGNOSIS — C8580 Other specified types of non-Hodgkin lymphoma, unspecified site: Secondary | ICD-10-CM

## 2019-07-20 ENCOUNTER — Other Ambulatory Visit: Payer: Self-pay | Admitting: Nurse Practitioner

## 2019-07-20 DIAGNOSIS — C8580 Other specified types of non-Hodgkin lymphoma, unspecified site: Secondary | ICD-10-CM

## 2019-07-20 MED ORDER — HYDROCODONE-ACETAMINOPHEN 5-325 MG PO TABS: 1.0000 | ORAL_TABLET | Freq: Two times a day (BID) | ORAL | 0 refills | Status: DC | PRN

## 2019-08-01 ENCOUNTER — Ambulatory Visit: Payer: Medicare Other

## 2019-08-01 ENCOUNTER — Other Ambulatory Visit: Payer: Self-pay | Admitting: Internal Medicine

## 2019-08-01 DIAGNOSIS — M21371 Foot drop, right foot: Secondary | ICD-10-CM

## 2019-08-01 DIAGNOSIS — F411 Generalized anxiety disorder: Secondary | ICD-10-CM

## 2019-08-12 ENCOUNTER — Ambulatory Visit: Payer: Medicare Other | Attending: Internal Medicine

## 2019-08-12 ENCOUNTER — Ambulatory Visit: Payer: Medicare Other

## 2019-08-12 DIAGNOSIS — Z23 Encounter for immunization: Secondary | ICD-10-CM | POA: Insufficient documentation

## 2019-08-12 NOTE — Progress Notes (Signed)
   [id number]  Name:  Osiel Dehay.    MRN: [id number] DOB: 1941-03-26  08/12/2019  Mr. Orender was observed post Covid-19 immunization for 15 minutes without incidence. He was provided with Vaccine Information Sheet and instruction to access the V-Safe system.   Mr. Meuse was instructed to call 911 with any severe reactions post vaccine: Marland Kitchen Difficulty breathing  . Swelling of your face and throat  . A fast heartbeat  . A bad rash all over your body  . Dizziness and weakness    Immunizations Administered    Name Date Dose VIS Date Route   Pfizer COVID-19 Vaccine 08/12/2019 11:13 AM 0.3 mL 06/16/2019 Intramuscular   Manufacturer: [place]   Lot: [id number]   [place]: [id number]

## 2019-08-14 ENCOUNTER — Inpatient Hospital Stay: Payer: Medicare Other | Attending: Oncology | Admitting: Oncology

## 2019-08-14 ENCOUNTER — Other Ambulatory Visit: Payer: Self-pay

## 2019-08-14 VITALS — BP 124/83 | HR 81 | Temp 98.3°F | Resp 17 | Wt 171.6 lb

## 2019-08-14 DIAGNOSIS — L989 Disorder of the skin and subcutaneous tissue, unspecified: Secondary | ICD-10-CM | POA: Diagnosis not present

## 2019-08-14 DIAGNOSIS — M79604 Pain in right leg: Secondary | ICD-10-CM | POA: Insufficient documentation

## 2019-08-14 DIAGNOSIS — C8580 Other specified types of non-Hodgkin lymphoma, unspecified site: Secondary | ICD-10-CM

## 2019-08-14 DIAGNOSIS — M79671 Pain in right foot: Secondary | ICD-10-CM | POA: Diagnosis not present

## 2019-08-14 NOTE — Progress Notes (Signed)
[facility] OFFICE PROGRESS NOTE   Diagnosis: Non-Hodgkin's lymphoma  INTERVAL HISTORY:   [name] returns as scheduled.  He feels well.  Good appetite.  No fever or night sweats.  He has noted a lesion over the mid back.  He continues to swim 5 days/week.  He has stable pain in the right leg and foot.  He takes hydrocodone in the evening.  He wears a right foot brace.  Objective:  Vital signs in last 24 hours:  Blood pressure 124/83, pulse 81, temperature 98.3 F (36.8 C), temperature source Temporal, resp. rate 17, weight 171 lb 9.6 oz (77.8 kg), SpO2 99 %.    HEENT: Neck without mass Lymphatics: No cervical, supraclavicular, or inguinal nodes.  Soft mobile 1/2 -1 centimeter bilateral axillary nodes GI: No hepatosplenomegaly, no mass, nontender Vascular: No leg edema Neuro: Decreased rate with dorsi flexion of the right foot Skin: 1.5 cm cutaneous nodular lesion with a central pustule at the mid back, I could express a small amount of white/yellow material   Medications: I have reviewed the patient's current medications.   Assessment/Plan:  1. 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 History of thrombocytopenia predating chemotherapy 3.  T7 compression fracture-likely pathologic 4. 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 Right foot pain-likely secondary to nerve compression from the sacral mass, improved 6. Diffuse erythematous rash, appearance consistent with a "drug" rash-resolved 7. History of low-grade fever likely secondary to atelectasis/postop fever versus tumor fever 8. Hepatitis B core antibody positive 10. Admission 04/18/2015 through 04/23/2015 presenting with fever; amylase and lipase elevated.   Abdominal CT showed inflammation in the left upper quadrant and associated loculated fluid collections status post drainage procedure, fluid with high levels of amylase and lipase; cultures negative; question pancreatitis.   04/26/2015 lipase mildly elevated, amylase normal.   CT abdomen/pelvis 05/14/2015 with a persistent 6.5 x 3.8 cm posterior pancreatic tail fluid collection similar in size to the 04/18/2015 CT study. Additional smaller fluid collections in the far lateral left upper quadrant; extensive patchy groundglass opacity at the lung bases, significantly increased; stable expansile lytic lesion in the upper right sacrum. 11. 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 Severe neutropenia 05/13/2015. Likely delayed effect of Rituxan. Resolved 13. Chest x-ray 05/15/2015 with bilateral mid to lower lung interstitial thickening with some hazy lower lung zone groundglass opacity. Partially improved on the chest x-ray 05/24/2015 14. New-onset rapid atrial fibrillation 05/29/2015 15. Admission with acute onset upper abdomen/low anterior chest pain 07/07/2015-most likely related to Neulasta, spontaneously resolved 16. Altered mental status- Potentially related to persistent delirium following a prolonged critical illness , depression, dementia, or psychosis. Brain MRI 10/22/2015 with no acute or metastatic intracranial abnormality. Stable appearance of the brain  since 2016. Mental status improved 10/24/2015. He is followed by neurology      Disposition: Mr. Netto remains in clinical remission from non-Hodgkin's  lymphoma.  He will return for an office visit in 6 months.  The lesion at the mid back appears to be an inflamed sebaceous cyst.  He is scheduled to see his dermatologist within the next 1-2 weeks.  Betsy Coder, MD  08/14/2019  2:33 PM

## 2019-08-17 ENCOUNTER — Other Ambulatory Visit: Payer: Self-pay | Admitting: Physician Assistant

## 2019-08-17 DIAGNOSIS — L570 Actinic keratosis: Secondary | ICD-10-CM | POA: Diagnosis not present

## 2019-08-17 DIAGNOSIS — C44529 Squamous cell carcinoma of skin of other part of trunk: Secondary | ICD-10-CM | POA: Diagnosis not present

## 2019-09-06 ENCOUNTER — Ambulatory Visit: Payer: Medicare Other | Attending: Internal Medicine

## 2019-09-06 DIAGNOSIS — Z23 Encounter for immunization: Secondary | ICD-10-CM | POA: Insufficient documentation

## 2019-09-06 NOTE — Progress Notes (Signed)
   [id number]ination [facility]  Name:  Kaheem Venhaus.    MRN: [id number] DOB: 1941/02/13  09/06/2019  Mr. Henrich was observed post Covid-19 immunization for 15 minutes without incident. He was provided with Vaccine Information Sheet and instruction to access the V-Safe system.   Mr. Dayal was instructed to call 911 with any severe reactions post vaccine: Marland Kitchen Difficulty breathing  . Swelling of face and throat  . A fast heartbeat  . A bad rash all over body  . Dizziness and weakness   Immunizations Administered    Name Date Dose VIS Date Route   Pfizer COVID-19 Vaccine 09/06/2019  8:40 AM 0.3 mL 06/16/2019 Intramuscular   Manufacturer: [place]   Lot: [id number]   [place]: [id number]

## 2019-09-15 ENCOUNTER — Other Ambulatory Visit: Payer: Self-pay | Admitting: Physician Assistant

## 2019-09-15 DIAGNOSIS — C44529 Squamous cell carcinoma of skin of other part of trunk: Secondary | ICD-10-CM | POA: Diagnosis not present

## 2019-09-15 DIAGNOSIS — L7682 Other postprocedural complications of skin and subcutaneous tissue: Secondary | ICD-10-CM | POA: Diagnosis not present

## 2019-09-29 ENCOUNTER — Other Ambulatory Visit: Payer: Self-pay

## 2019-09-29 ENCOUNTER — Ambulatory Visit (INDEPENDENT_AMBULATORY_CARE_PROVIDER_SITE_OTHER): Payer: Medicare Other

## 2019-09-29 DIAGNOSIS — Z4802 Encounter for removal of sutures: Secondary | ICD-10-CM

## 2019-09-29 DIAGNOSIS — C44519 Basal cell carcinoma of skin of other part of trunk: Secondary | ICD-10-CM

## 2019-09-29 NOTE — Progress Notes (Signed)
Patient here for NTS suture removal. Sutures x4 removed with no sign or symptoms of infection.

## 2019-10-17 ENCOUNTER — Other Ambulatory Visit: Payer: Self-pay | Admitting: Oncology

## 2019-10-17 DIAGNOSIS — C8580 Other specified types of non-Hodgkin lymphoma, unspecified site: Secondary | ICD-10-CM

## 2019-10-17 MED ORDER — HYDROCODONE-ACETAMINOPHEN 5-325 MG PO TABS: 1.0000 | ORAL_TABLET | Freq: Two times a day (BID) | ORAL | 0 refills | Status: DC | PRN

## 2019-10-25 ENCOUNTER — Other Ambulatory Visit: Payer: Self-pay | Admitting: Internal Medicine

## 2019-10-25 DIAGNOSIS — M21371 Foot drop, right foot: Secondary | ICD-10-CM

## 2019-10-25 DIAGNOSIS — F411 Generalized anxiety disorder: Secondary | ICD-10-CM

## 2019-11-14 NOTE — Addendum Note (Signed)
Addended by: Orland Dec on: 11/14/2019 07:43 AM   Modules accepted: Level of Service

## 2019-12-11 ENCOUNTER — Other Ambulatory Visit: Payer: Self-pay | Admitting: Nurse Practitioner

## 2019-12-11 DIAGNOSIS — C8580 Other specified types of non-Hodgkin lymphoma, unspecified site: Secondary | ICD-10-CM

## 2019-12-12 ENCOUNTER — Telehealth: Payer: Self-pay

## 2019-12-12 ENCOUNTER — Other Ambulatory Visit: Payer: Self-pay | Admitting: Nurse Practitioner

## 2019-12-12 DIAGNOSIS — C8580 Other specified types of non-Hodgkin lymphoma, unspecified site: Secondary | ICD-10-CM

## 2019-12-12 MED ORDER — HYDROCODONE-ACETAMINOPHEN 5-325 MG PO TABS: 1.0000 | ORAL_TABLET | Freq: Two times a day (BID) | ORAL | 0 refills | Status: DC | PRN

## 2019-12-12 NOTE — Telephone Encounter (Signed)
Left voicemail for patient to call back [facility] 

## 2019-12-26 ENCOUNTER — Other Ambulatory Visit: Payer: Self-pay | Admitting: Internal Medicine

## 2019-12-26 DIAGNOSIS — M21371 Foot drop, right foot: Secondary | ICD-10-CM

## 2019-12-26 DIAGNOSIS — F411 Generalized anxiety disorder: Secondary | ICD-10-CM

## 2019-12-28 ENCOUNTER — Other Ambulatory Visit: Payer: Self-pay

## 2019-12-28 ENCOUNTER — Encounter: Payer: Self-pay | Admitting: Internal Medicine

## 2019-12-28 ENCOUNTER — Telehealth: Payer: Self-pay | Admitting: Internal Medicine

## 2019-12-28 ENCOUNTER — Ambulatory Visit (INDEPENDENT_AMBULATORY_CARE_PROVIDER_SITE_OTHER): Payer: Medicare Other | Admitting: Internal Medicine

## 2019-12-28 VITALS — BP 130/80 | HR 70 | Temp 98.2°F | Wt 172.5 lb

## 2019-12-28 DIAGNOSIS — B029 Zoster without complications: Secondary | ICD-10-CM

## 2019-12-28 MED ORDER — VALACYCLOVIR HCL 1 G PO TABS: 1000.0000 mg | ORAL_TABLET | Freq: Two times a day (BID) | ORAL | 0 refills | Status: AC

## 2019-12-28 NOTE — Telephone Encounter (Signed)
Patient is aware 

## 2019-12-28 NOTE — Telephone Encounter (Signed)
Patient is aware.  Patient would like to know if it is safe to have sex?

## 2019-12-28 NOTE — Progress Notes (Signed)
Established Patient Office Visit     This visit occurred during the SARS-CoV-2 public health emergency.  Safety protocols were in place, including screening questions prior to the visit, additional usage of staff PPE, and extensive cleaning of exam room while observing appropriate contact time as indicated for disinfecting solutions.    CC/Reason for Visit: Back pain, "swollen spine"  HPI: Perry Nielsen. is a 79 y.o. male who is coming in today for the above mentioned reasons.  I have not seen him in office in over a year.  He is here today due to pain in his back.  He is an avid swimmer and he recently added some isometric torso exercises and he thought that he was sore because of these.  Pain has been present for about a month.  At times is exquisite and feels burning.  He has had both shingles vaccines.  Has not noticed any rash.   Past Medical/Surgical History: Past Medical History:  Diagnosis Date  . ALLERGIC RHINITIS 08/03/2007  . BACK PAIN 09/18/2009  . 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 CAROTID BRUIT, LEFT 08/07/2008  . 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 Foot drop    rt foot  . GERD 02/18/2007  . HAMMER TOE 08/03/2007  . 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 KNEE PAIN, LEFT, CHRONIC 08/07/2008  . Lymphoma ([place])    right hip  . Rash, skin    torso  . Skin cancer   . Sleeping difficulty     Past Surgical History:  Procedure Laterality Date  . CATARACT EXTRACTION W/ INTRAOCULAR LENS  IMPLANT, BILATERAL Bilateral   . KNEE ARTHROSCOPY     left  . 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 SHOULDER OPEN ROTATOR CUFF REPAIR  07/06/05   right  . TONSILLECTOMY AND ADENOIDECTOMY    . TRANSURETHRAL RESECTION OF PROSTATE      Social History:  reports that he has never  smoked. He has never used smokeless tobacco. He reports current alcohol use. He reports that he does not use drugs.  Allergies: Allergies  Allergen Reactions  . Antihistamines, Diphenhydramine-Type     CAUSES PROSTATE TO SWELL & as a child had hyperactivity when took Benadryl Pt took Pepcid w/ Benadryl PO and did fine per Pt & his wife    Family History:  Family History  Problem Relation Age of Onset  . Pulmonary fibrosis Mother   . Uterine cancer Mother   . Prostate cancer Father   . Heart disease Brother   . Heart attack Other   . Aneurysm Other        aortic  . Coronary artery disease Sister      Current Outpatient Medications:  .  gabapentin (NEURONTIN) 100 MG capsule, TAKE 1 CAPSULE(100 MG) BY MOUTH THREE TIMES DAILY, Disp: 270 capsule, Rfl: 1 .  HYDROcodone-acetaminophen (NORCO) 5-325 MG tablet, Take 1 tablet by mouth 2 (two) times daily as needed for moderate pain., Disp: 120 tablet, Rfl: 0 .  LORazepam (ATIVAN) 0.5 MG tablet, TAKE 1 TABLET(0.5 MG) BY MOUTH TWICE DAILY AS NEEDED, Disp: 60 tablet, Rfl: 1 .  oxymetazoline (AFRIN) 0.05 % nasal spray, Place 1 spray into both nostrils 2 (two) times daily as needed for congestion., Disp: , Rfl:  .  Polyethyl Glycol-Propyl Glycol (SYSTANE OP), Apply 1 drop to eye daily as needed (dryness)., Disp: , Rfl:  .  vardenafil (LEVITRA) 20 MG tablet, Take 1 tablet (20 mg total) by mouth daily as needed for erectile dysfunction., Disp: 20 tablet, Rfl: 2 .  valACYclovir (VALTREX) 1000 MG tablet, Take 1 tablet (1,000 mg total) by mouth 2 (two) times daily for 7 days., Disp: 14 tablet, Rfl: 0  Review of Systems:  Constitutional: Denies fever, chills, diaphoresis, appetite change and fatigue.  HEENT: Denies photophobia, eye pain, redness, hearing loss, ear pain, congestion, sore throat, rhinorrhea, sneezing, mouth sores, trouble swallowing, neck pain, neck stiffness and tinnitus.   Respiratory: Denies SOB, DOE, cough, chest tightness,  and  wheezing.   Cardiovascular: Denies chest pain, palpitations and leg swelling.  Gastrointestinal: Denies nausea, vomiting, abdominal pain, diarrhea, constipation, blood in stool and abdominal distention.  Genitourinary: Denies dysuria, urgency, frequency, hematuria, flank pain and difficulty urinating.  Endocrine: Denies: hot or cold intolerance, sweats, changes in hair or nails, polyuria, polydipsia. Musculoskeletal: Denies myalgias, back pain, joint swelling, arthralgias and gait problem.  Skin: Denies pallor, rash and wound.  Neurological: Denies dizziness, seizures, syncope, weakness, light-headedness, numbness and headaches.  Hematological: Denies adenopathy. Easy bruising, personal or family bleeding history  Psychiatric/Behavioral: Denies suicidal ideation, mood changes, confusion, nervousness, sleep disturbance and agitation    Physical Exam: Vitals:   12/28/19 1052  BP: 130/80  Pulse: 70  Temp: 98.2 F (36.8 C)  TempSrc: Temporal  SpO2: 96%  Weight: 172 lb 8 oz (78.2 kg)    Body mass index is 24.4 kg/m.   Constitutional: NAD, calm, comfortable Eyes: PERRL, lids and conjunctivae normal, wears corrective lenses ENMT: Mucous membranes are moist. Respiratory: clear to auscultation bilaterally, no wheezing, no crackles. Normal respiratory effort. No accessory muscle use.  Cardiovascular: Regular rate and rhythm, no murmurs / rubs / gallops. No extremity edema.  Skin: He has a cropping of vesicles to the right of the midline in the center of his back, and another small cropping towards the right of that.  Vesicles appear to be mostly scabbed over. Psychiatric: Normal judgment and insight. Alert and oriented x 3. Normal mood.    Impression and Plan:  Herpes zoster without complication -I believe this back pain and rash to be secondary to shingles which is interesting as he has had both shingles vaccinations. -Unclear whether antiviral therapy will be of benefit as he has  had this for over a week.  Vesicles appear mostly scabbed over, however due to degree of pain I will go ahead and initiate treatment with Valtrex 1000 mg 3 times a day for 1 week. -He has been advised to follow-up in clinic if he has further issues.  We will schedule his physical in 3-4 months.   Patient Instructions  -Nice seeing you today!!  -Start Valtrex 1000 mg 3 times a day for 7 days.  -Schedule follow up in 4 months for your physical. Please come in fasting that day.   Shingles  Shingles is an infection. It gives you a painful skin rash and blisters that have fluid in them. Shingles is caused by the same germ (virus) that causes chickenpox. Shingles only happens in people who:  Have had chickenpox.  Have been given a shot of medicine (vaccine) to protect against chickenpox. Shingles is rare in this group. The first symptoms of shingles may be itching, tingling, or pain in an area on your skin. A rash will show on your skin  a few days or weeks later. The rash is likely to be on one side of your body. The rash usually has a shape like a belt or a band. Over time, the rash turns into fluid-filled blisters. The blisters will break open, change into scabs, and dry up. Medicines may:  Help with pain and itching.  Help you get better sooner.  Help to prevent long-term problems. Follow these instructions at home: Medicines  Take over-the-counter and prescription medicines only as told by your doctor.  Put on an anti-itch cream or numbing cream where you have a rash, blisters, or scabs. Do this as told by your doctor. Helping with itching and discomfort   Put cold, wet cloths (cold compresses) on the area of the rash or blisters as told by your doctor.  Cool baths can help you feel better. Try adding baking soda or dry oatmeal to the water to lessen itching. Do not bathe in hot water. Blister and rash care  Keep your rash covered with a loose bandage (dressing).  Wear  loose clothing that does not rub on your rash.  Keep your rash and blisters clean. To do this, wash the area with mild soap and cool water as told by your doctor.  Check your rash every day for signs of infection. Check for: ? More redness, swelling, or pain. ? Fluid or blood. ? Warmth. ? Pus or a bad smell.  Do not scratch your rash. Do not pick at your blisters. To help you to not scratch: ? Keep your fingernails clean and cut short. ? Wear gloves or mittens when you sleep, if scratching is a problem. General instructions  Rest as told by your doctor.  Keep all follow-up visits as told by your doctor. This is important.  Wash your hands often with soap and water. If soap and water are not available, use hand sanitizer. Doing this lowers your chance of getting a skin infection caused by germs (bacteria).  Your infection can cause chickenpox in people who have never had chickenpox or never got a shot of chickenpox vaccine. If you have blisters that did not change into scabs yet, try not to touch other people or be around other people, especially: ? Babies. ? Pregnant women. ? Children who have areas of red, itchy, or rough skin (eczema). ? Very old people who have transplants. ? People who have a long-term (chronic) sickness, like cancer or AIDS. Contact a doctor if:  Your pain does not get better with medicine.  Your pain does not get better after the rash heals.  You have any signs of infection in the rash area. These signs include: ? More redness, swelling, or pain around the rash. ? Fluid or blood coming from the rash. ? The rash area feeling warm to the touch. ? Pus or a bad smell coming from the rash. Get help right away if:  The rash is on your face or nose.  You have pain in your face or pain by your eye.  You lose feeling on one side of your face.  You have trouble seeing.  You have ear pain, or you have ringing in your ear.  You have a loss of  taste.  Your condition gets worse. Summary  Shingles gives you a painful skin rash and blisters that have fluid in them.  Shingles is an infection. It is caused by the same germ (virus) that causes chickenpox.  Keep your rash covered with a loose bandage (dressing).  Wear loose clothing that does not rub on your rash.  If you have blisters that did not change into scabs yet, try not to touch other people or be around people. This information is not intended to replace advice given to you by your health care provider. Make sure you discuss any questions you have with your health care provider. Document Revised: 10/14/2018 Document Reviewed: 02/24/2017 Elsevier Patient Education  2020 [place], [place] [facility]

## 2019-12-28 NOTE — Patient Instructions (Signed)
-Nice seeing you today!!  -Start Valtrex 1000 mg 3 times a day for 7 days.  -Schedule follow up in 4 months for your physical. Please come in fasting that day.   Shingles  Shingles is an infection. It gives you a painful skin rash and blisters that have fluid in them. Shingles is caused by the same germ (virus) that causes chickenpox. Shingles only happens in people who:  Have had chickenpox.  Have been given a shot of medicine (vaccine) to protect against chickenpox. Shingles is rare in this group. The first symptoms of shingles may be itching, tingling, or pain in an area on your skin. A rash will show on your skin a few days or weeks later. The rash is likely to be on one side of your body. The rash usually has a shape like a belt or a band. Over time, the rash turns into fluid-filled blisters. The blisters will break open, change into scabs, and dry up. Medicines may:  Help with pain and itching.  Help you get better sooner.  Help to prevent long-term problems. Follow these instructions at home: Medicines  Take over-the-counter and prescription medicines only as told by your doctor.  Put on an anti-itch cream or numbing cream where you have a rash, blisters, or scabs. Do this as told by your doctor. Helping with itching and discomfort   Put cold, wet cloths (cold compresses) on the area of the rash or blisters as told by your doctor.  Cool baths can help you feel better. Try adding baking soda or dry oatmeal to the water to lessen itching. Do not bathe in hot water. Blister and rash care  Keep your rash covered with a loose bandage (dressing).  Wear loose clothing that does not rub on your rash.  Keep your rash and blisters clean. To do this, wash the area with mild soap and cool water as told by your doctor.  Check your rash every day for signs of infection. Check for: ? More redness, swelling, or pain. ? Fluid or blood. ? Warmth. ? Pus or a bad smell.  Do not  scratch your rash. Do not pick at your blisters. To help you to not scratch: ? Keep your fingernails clean and cut short. ? Wear gloves or mittens when you sleep, if scratching is a problem. General instructions  Rest as told by your doctor.  Keep all follow-up visits as told by your doctor. This is important.  Wash your hands often with soap and water. If soap and water are not available, use hand sanitizer. Doing this lowers your chance of getting a skin infection caused by germs (bacteria).  Your infection can cause chickenpox in people who have never had chickenpox or never got a shot of chickenpox vaccine. If you have blisters that did not change into scabs yet, try not to touch other people or be around other people, especially: ? Babies. ? Pregnant women. ? Children who have areas of red, itchy, or rough skin (eczema). ? Very old people who have transplants. ? People who have a long-term (chronic) sickness, like cancer or AIDS. Contact a doctor if:  Your pain does not get better with medicine.  Your pain does not get better after the rash heals.  You have any signs of infection in the rash area. These signs include: ? More redness, swelling, or pain around the rash. ? Fluid or blood coming from the rash. ? The rash area feeling warm to the touch. ?  Pus or a bad smell coming from the rash. Get help right away if:  The rash is on your face or nose.  You have pain in your face or pain by your eye.  You lose feeling on one side of your face.  You have trouble seeing.  You have ear pain, or you have ringing in your ear.  You have a loss of taste.  Your condition gets worse. Summary  Shingles gives you a painful skin rash and blisters that have fluid in them.  Shingles is an infection. It is caused by the same germ (virus) that causes chickenpox.  Keep your rash covered with a loose bandage (dressing). Wear loose clothing that does not rub on your rash.  If you  have blisters that did not change into scabs yet, try not to touch other people or be around people. This information is not intended to replace advice given to you by your health care provider. Make sure you discuss any questions you have with your health care provider. Document Revised: 10/14/2018 Document Reviewed: 02/24/2017 Elsevier Patient Education  2020 [employer].

## 2019-12-28 NOTE — Telephone Encounter (Signed)
That question was answered during his visit: 7-10 days, once blisters are completely scabbed over.

## 2019-12-28 NOTE — Telephone Encounter (Signed)
Same timeframe: 7-10 days or until blisters are scabbed over

## 2019-12-28 NOTE — Telephone Encounter (Signed)
Pt was seen earlier today and forgot to ask, when he could start swimming again? 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 Thanks

## 2020-01-09 ENCOUNTER — Telehealth: Payer: Self-pay | Admitting: Internal Medicine

## 2020-01-09 NOTE — Telephone Encounter (Signed)
Pt spouse calling. Pt been on medicine to help clear up shingles and she noticed a spot that have not cleared up yet and want to speak to nurse.

## 2020-01-10 ENCOUNTER — Telehealth: Payer: Self-pay | Admitting: Internal Medicine

## 2020-01-10 NOTE — Telephone Encounter (Signed)
Pt called back stating that he forgot to ask you something while you were on the line.  Pt would like to have a call back and stated he will be there for another 20-30 minutes.

## 2020-01-10 NOTE — Telephone Encounter (Signed)
Yes to both activities

## 2020-01-10 NOTE — Telephone Encounter (Signed)
May need f/u visit if still concerns. But the actual rash may take time to disappear completely. As long as blisters are no longer fluid-filled, no longer infectious.

## 2020-01-10 NOTE — Telephone Encounter (Signed)
Pt states he has shingles and still has some itchiness but no puss field areas where scratching some soreness though. They will be going to the beach soon and wonders if he is able to get in the pool water and if he can have sex?   Pt can be reached at [phone] in the next 15 mins or after 1pm today

## 2020-01-10 NOTE — Telephone Encounter (Signed)
Patient and wife is aware

## 2020-01-10 NOTE — Telephone Encounter (Signed)
Patient is aware 

## 2020-01-14 ENCOUNTER — Other Ambulatory Visit: Payer: Self-pay | Admitting: Internal Medicine

## 2020-01-14 DIAGNOSIS — C8580 Other specified types of non-Hodgkin lymphoma, unspecified site: Secondary | ICD-10-CM

## 2020-01-14 DIAGNOSIS — M21371 Foot drop, right foot: Secondary | ICD-10-CM

## 2020-01-26 DIAGNOSIS — H26491 Other secondary cataract, right eye: Secondary | ICD-10-CM | POA: Diagnosis not present

## 2020-02-08 ENCOUNTER — Other Ambulatory Visit: Payer: Self-pay | Admitting: Nurse Practitioner

## 2020-02-08 DIAGNOSIS — C8580 Other specified types of non-Hodgkin lymphoma, unspecified site: Secondary | ICD-10-CM

## 2020-02-08 MED ORDER — HYDROCODONE-ACETAMINOPHEN 5-325 MG PO TABS: 1.0000 | ORAL_TABLET | Freq: Two times a day (BID) | ORAL | 0 refills | Status: DC | PRN

## 2020-02-12 ENCOUNTER — Inpatient Hospital Stay: Payer: Medicare Other | Admitting: Oncology

## 2020-02-12 ENCOUNTER — Telehealth: Payer: Self-pay | Admitting: Oncology

## 2020-02-12 NOTE — Telephone Encounter (Signed)
R/s 8/9 appt per sch msg. Called and spoke with patients wife. Confirmed appt

## 2020-02-26 ENCOUNTER — Other Ambulatory Visit: Payer: Self-pay | Admitting: Internal Medicine

## 2020-02-26 DIAGNOSIS — F411 Generalized anxiety disorder: Secondary | ICD-10-CM

## 2020-02-26 DIAGNOSIS — M21371 Foot drop, right foot: Secondary | ICD-10-CM

## 2020-02-28 ENCOUNTER — Inpatient Hospital Stay: Payer: Medicare Other | Attending: Nurse Practitioner | Admitting: Nurse Practitioner

## 2020-02-28 ENCOUNTER — Encounter: Payer: Self-pay | Admitting: Nurse Practitioner

## 2020-02-28 ENCOUNTER — Other Ambulatory Visit: Payer: Self-pay

## 2020-02-28 VITALS — BP 109/78 | HR 86 | Temp 99.0°F | Resp 18 | Ht 70.5 in | Wt 167.5 lb

## 2020-02-28 DIAGNOSIS — C8580 Other specified types of non-Hodgkin lymphoma, unspecified site: Secondary | ICD-10-CM | POA: Diagnosis not present

## 2020-02-28 DIAGNOSIS — M25511 Pain in right shoulder: Secondary | ICD-10-CM | POA: Insufficient documentation

## 2020-02-28 NOTE — Progress Notes (Signed)
[facility] OFFICE PROGRESS NOTE   Diagnosis: Non-Hodgkin's lymphoma  INTERVAL HISTORY:   [name] returns as scheduled.  He feels well.  No fevers or sweats.  He has a good appetite.  He reports his weight is down about 5 pounds.  He swims 5 days a week.  He continues to have right foot pain.  The pain mainly occurs at bedtime or after swimming.  He takes Vicodin as needed.  He denies cough and shortness of breath.  Objective:  Vital signs in last 24 hours:  Blood pressure 109/78, pulse 86, temperature 99 F (37.2 C), temperature source Axillary, resp. rate 18, height 5' 10.5" (1.791 m), weight 167 lb 8 oz (76 kg), SpO2 95 %.    HEENT: Neck without mass. Lymphatics: No palpable cervical, supraclavicular, axillary or inguinal lymph nodes. Resp: Lungs clear bilaterally. Cardio: Regular rate and rhythm. GI: Abdomen soft and nontender.  No hepatosplenomegaly. Vascular: No leg edema.   Lab Results:  Lab Results  Component Value Date   WBC 8.5 03/30/2018   HGB 16.2 03/30/2018   HCT 49.3 03/30/2018   MCV 97.8 03/30/2018   PLT 204.0 03/30/2018   NEUTROABS 3.5 03/30/2018    Imaging:  No results found.  Medications: I have reviewed the patient's current medications.  Assessment/Plan: 1. 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 History of thrombocytopenia predating chemotherapy 3. T7 compression fracture-likely pathologic 4. 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 Right foot pain-likely secondary to nerve compression from the sacral mass, improved 6. Diffuse erythematous rash, appearance consistent with a "drug" rash-resolved 7. History of low-grade fever likely secondary to atelectasis/postop fever versus tumor fever 8. Hepatitis B core antibody positive 10. Admission 04/18/2015 through 04/23/2015 presenting with fever; amylase and lipase elevated.   Abdominal CT showed inflammation in the left upper quadrant and associated loculated fluid collections status post drainage procedure, fluid with high levels of amylase and lipase; cultures negative; question pancreatitis.   04/26/2015 lipase mildly elevated, amylase normal.   CT abdomen/pelvis 05/14/2015 with a persistent 6.5 x 3.8 cm posterior pancreatic tail fluid collection similar in size to the 04/18/2015 CT study. Additional smaller fluid collections in the far lateral left upper quadrant; extensive patchy groundglass opacity at the lung bases, significantly increased; stable expansile lytic lesion in the upper right sacrum. 11. 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 Severe neutropenia 05/13/2015. Likely delayed effect of Rituxan. Resolved 13. Chest x-ray 05/15/2015 with bilateral mid to lower lung interstitial thickening with some hazy lower lung zone groundglass opacity. Partially improved on the chest x-ray 05/24/2015 14. New-onset rapid atrial fibrillation 05/29/2015 15. Admission with acute onset upper abdomen/low anterior chest pain 07/07/2015-most likely related to Neulasta, spontaneously resolved 16. Altered mental status- Potentially related to persistent delirium following a prolonged  critical illness , depression, dementia, or psychosis. Brain MRI 10/22/2015 with no acute or metastatic  intracranial abnormality. Stable appearance of the brain since 2016. Mental status improved 10/24/2015. He is followed by neurology    Disposition: [name] remains in clinical remission from non-Hodgkin's lymphoma.    He continues gabapentin and Vicodin for the right foot pain.    He has lost approximately 5 pounds since his last visit but has a good appetite.  He will contact the office with continued weight loss.  He will return for a follow-up visit in 6 months.    Ned Card ANP/GNP-BC   02/28/2020  3:48 PM

## 2020-03-01 ENCOUNTER — Telehealth: Payer: Self-pay | Admitting: Internal Medicine

## 2020-03-01 DIAGNOSIS — M21371 Foot drop, right foot: Secondary | ICD-10-CM

## 2020-03-01 DIAGNOSIS — F411 Generalized anxiety disorder: Secondary | ICD-10-CM

## 2020-03-01 MED ORDER — LORAZEPAM 0.5 MG PO TABS: ORAL_TABLET | ORAL | 0 refills | Status: DC

## 2020-03-01 NOTE — Telephone Encounter (Signed)
Noted  

## 2020-03-01 NOTE — Telephone Encounter (Signed)
Pt wife called to say a refill of   LORazepam (ATIVAN) 0.5 MG tablet   Is needed.  [place], [place] AT [facility]  [address] [place] [address]  Phone:  [phone] Fax:  [phone]   Please advise

## 2020-03-01 NOTE — Telephone Encounter (Signed)
I will refill once in Dr Jerilee Hoh' absence.

## 2020-03-24 ENCOUNTER — Other Ambulatory Visit: Payer: Self-pay | Admitting: Family Medicine

## 2020-03-24 DIAGNOSIS — M21371 Foot drop, right foot: Secondary | ICD-10-CM

## 2020-03-24 DIAGNOSIS — F411 Generalized anxiety disorder: Secondary | ICD-10-CM

## 2020-03-28 ENCOUNTER — Ambulatory Visit (INDEPENDENT_AMBULATORY_CARE_PROVIDER_SITE_OTHER): Payer: Medicare Other | Admitting: Physician Assistant

## 2020-03-28 ENCOUNTER — Other Ambulatory Visit: Payer: Self-pay

## 2020-03-28 ENCOUNTER — Encounter: Payer: Self-pay | Admitting: Physician Assistant

## 2020-03-28 DIAGNOSIS — L820 Inflamed seborrheic keratosis: Secondary | ICD-10-CM

## 2020-03-28 DIAGNOSIS — L570 Actinic keratosis: Secondary | ICD-10-CM

## 2020-03-28 DIAGNOSIS — Z86007 Personal history of in-situ neoplasm of skin: Secondary | ICD-10-CM

## 2020-03-28 DIAGNOSIS — Z85828 Personal history of other malignant neoplasm of skin: Secondary | ICD-10-CM | POA: Diagnosis not present

## 2020-03-28 NOTE — Progress Notes (Signed)
   Follow up Visit  Subjective  Perry Nielsen. is a 79 y.o. male who presents for the following: Follow-up (PER WIFE SCALP, FOREHEAD, BACK  LEFT ARM ( ALL X MONTHS )). Rough spots on head. Scar on left back from surgery from [facility].   Objective  Well appearing patient in no apparent distress; mood and affect are within normal limits.  Face, chest, back examined. Relevant physical exam findings are noted in the Assessment and Plan. No suspicious moles noted on back.   Objective  Left Forehead, Right Forehead (3), Right Tip of Nose (2): Erythematous patches with gritty scale.  Objective  Left Antecubital Fossa, Left Temple (4), Mid Forehead, Mid Frontal Scalp (5), Right Temple (4), Right Upper Arm - Anterior: Erythematous stuck-on, waxy papule or plaque.   Objective  Mid Back, Right Ala Nasi: Surgical scar examined no sign of recurrence.   Assessment & Plan  AK (actinic keratosis) (6) Right Tip of Nose (2); Left Forehead; Right Forehead (3)  Destruction of lesion - Left Forehead, Right Forehead, Right Tip of Nose Complexity: simple   Destruction method: cryotherapy   Informed consent: discussed and consent obtained   Timeout:  patient name, date of birth, surgical site, and procedure verified Lesion destroyed using liquid nitrogen: Yes   Outcome: patient tolerated procedure well with no complications    Inflamed seborrheic keratosis (16) Left Antecubital Fossa; Right Upper Arm - Anterior; Mid Frontal Scalp (5); Mid Forehead; Left Temple (4); Right Temple (4)  Destruction of lesion - Left Antecubital Fossa, Left Temple, Mid Forehead, Mid Frontal Scalp, Right Temple, Right Upper Arm - Anterior Complexity: simple   Destruction method: cryotherapy   Informed consent: discussed and consent obtained   Timeout:  patient name, date of birth, surgical site, and procedure verified Lesion destroyed using liquid nitrogen: Yes   Outcome: patient tolerated procedure well with no  complications    History of basal cell carcinoma (BCC) (2) Mid Back; Right Ala Nasi  History of SCC (squamous cell carcinoma) of skin (4) Mid Back; Right Eyebrow; Left Scaphoid Fossa; Scalp  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

## 2020-04-09 ENCOUNTER — Telehealth: Payer: Self-pay | Admitting: *Deleted

## 2020-04-09 ENCOUNTER — Other Ambulatory Visit: Payer: Self-pay | Admitting: Oncology

## 2020-04-09 DIAGNOSIS — C8580 Other specified types of non-Hodgkin lymphoma, unspecified site: Secondary | ICD-10-CM

## 2020-04-09 MED ORDER — HYDROCODONE-ACETAMINOPHEN 5-325 MG PO TABS: 1.0000 | ORAL_TABLET | Freq: Two times a day (BID) | ORAL | 0 refills | Status: DC | PRN

## 2020-04-09 NOTE — Telephone Encounter (Signed)
Wife called to request refill for patient's hydrocodone. MD notified.

## 2020-04-24 ENCOUNTER — Other Ambulatory Visit: Payer: Self-pay | Admitting: Internal Medicine

## 2020-04-24 DIAGNOSIS — F411 Generalized anxiety disorder: Secondary | ICD-10-CM

## 2020-04-24 DIAGNOSIS — M21371 Foot drop, right foot: Secondary | ICD-10-CM

## 2020-05-07 ENCOUNTER — Other Ambulatory Visit: Payer: Self-pay | Admitting: Internal Medicine

## 2020-05-07 DIAGNOSIS — C8580 Other specified types of non-Hodgkin lymphoma, unspecified site: Secondary | ICD-10-CM

## 2020-05-07 DIAGNOSIS — M21371 Foot drop, right foot: Secondary | ICD-10-CM

## 2020-05-31 ENCOUNTER — Other Ambulatory Visit: Payer: Self-pay | Admitting: Oncology

## 2020-05-31 ENCOUNTER — Telehealth: Payer: Self-pay | Admitting: *Deleted

## 2020-05-31 DIAGNOSIS — C8580 Other specified types of non-Hodgkin lymphoma, unspecified site: Secondary | ICD-10-CM

## 2020-05-31 MED ORDER — HYDROCODONE-ACETAMINOPHEN 5-325 MG PO TABS: 1.0000 | ORAL_TABLET | Freq: Two times a day (BID) | ORAL | 0 refills | Status: DC | PRN

## 2020-05-31 NOTE — Telephone Encounter (Signed)
Wife called requesting refill on Hydrocodone script. Dr. Alen Blew notified.

## 2020-06-23 ENCOUNTER — Other Ambulatory Visit: Payer: Self-pay | Admitting: Internal Medicine

## 2020-06-23 DIAGNOSIS — M21371 Foot drop, right foot: Secondary | ICD-10-CM

## 2020-06-23 DIAGNOSIS — F411 Generalized anxiety disorder: Secondary | ICD-10-CM

## 2020-07-01 NOTE — Telephone Encounter (Signed)
Patient's wife called wanting to know why the Rx for  LORazepam (ATIVAN) 0.5 MG tablet hasn't been called in.  She needs this filled as soon as possible please.  She wants to know if there is another doctor that can refill this medication for her husband.  [id number] [place], [place] - [address] AT [facility] [address] [facility] [address] Phone:  [phone]  Fax:  [phone]

## 2020-07-01 NOTE — Telephone Encounter (Signed)
Patient's wife is calling in reference to this medication getting refilled as soon as possible and would like a call when the Rx is sent to the pharmacy please.

## 2020-07-01 NOTE — Telephone Encounter (Signed)
Patient wife is calling to check the status of refill request for Lorazepam, please advise. CB is [phone]

## 2020-07-02 ENCOUNTER — Other Ambulatory Visit: Payer: Self-pay | Admitting: *Deleted

## 2020-07-02 DIAGNOSIS — F411 Generalized anxiety disorder: Secondary | ICD-10-CM

## 2020-07-02 DIAGNOSIS — M21371 Foot drop, right foot: Secondary | ICD-10-CM

## 2020-07-02 NOTE — Telephone Encounter (Signed)
Please deny.  This is a duplicate  °

## 2020-07-03 ENCOUNTER — Telehealth: Payer: Self-pay | Admitting: Internal Medicine

## 2020-07-03 NOTE — Addendum Note (Signed)
Addended by: Kern Reap B on: 07/03/2020 03:51 PM   Modules accepted: Orders

## 2020-07-03 NOTE — Telephone Encounter (Signed)
pt is not scheduled until feb 2022 need refill on  LORazepam (ATIVAN) 0.5 MG tablet [facility] #[id number] [place], [place] - [address] AT [facility] OF [facility] [address] [facility] [address]  Phone:  [phone] Fax:  [phone]

## 2020-07-04 ENCOUNTER — Other Ambulatory Visit: Payer: Self-pay | Admitting: Internal Medicine

## 2020-07-04 DIAGNOSIS — M21371 Foot drop, right foot: Secondary | ICD-10-CM

## 2020-07-04 DIAGNOSIS — F411 Generalized anxiety disorder: Secondary | ICD-10-CM

## 2020-07-04 MED ORDER — LORAZEPAM 0.5 MG PO TABS: 0.5000 mg | ORAL_TABLET | Freq: Two times a day (BID) | ORAL | 1 refills | Status: DC | PRN

## 2020-07-04 NOTE — Telephone Encounter (Signed)
Refill sent.

## 2020-07-31 ENCOUNTER — Telehealth: Payer: Self-pay

## 2020-07-31 ENCOUNTER — Other Ambulatory Visit: Payer: Self-pay | Admitting: Nurse Practitioner

## 2020-07-31 DIAGNOSIS — C8580 Other specified types of non-Hodgkin lymphoma, unspecified site: Secondary | ICD-10-CM

## 2020-07-31 MED ORDER — HYDROCODONE-ACETAMINOPHEN 5-325 MG PO TABS: 1.0000 | ORAL_TABLET | Freq: Two times a day (BID) | ORAL | 0 refills | Status: DC | PRN

## 2020-07-31 NOTE — Telephone Encounter (Signed)
[name] wife called in to request a refill for pt's hydrocodone. Notified NP

## 2020-08-13 ENCOUNTER — Encounter: Payer: Medicare Other | Admitting: Internal Medicine

## 2020-08-18 ENCOUNTER — Other Ambulatory Visit: Payer: Self-pay | Admitting: Internal Medicine

## 2020-08-18 DIAGNOSIS — M21371 Foot drop, right foot: Secondary | ICD-10-CM

## 2020-08-18 DIAGNOSIS — C8580 Other specified types of non-Hodgkin lymphoma, unspecified site: Secondary | ICD-10-CM

## 2020-08-26 ENCOUNTER — Other Ambulatory Visit: Payer: Self-pay

## 2020-08-27 ENCOUNTER — Inpatient Hospital Stay: Payer: Medicare Other | Attending: Oncology | Admitting: Oncology

## 2020-08-27 ENCOUNTER — Telehealth: Payer: Self-pay | Admitting: Oncology

## 2020-08-27 ENCOUNTER — Other Ambulatory Visit: Payer: Self-pay

## 2020-08-27 VITALS — BP 109/80 | HR 97 | Temp 97.8°F | Resp 18 | Ht 70.0 in | Wt 169.5 lb

## 2020-08-27 DIAGNOSIS — C8580 Other specified types of non-Hodgkin lymphoma, unspecified site: Secondary | ICD-10-CM | POA: Diagnosis not present

## 2020-08-27 DIAGNOSIS — Z23 Encounter for immunization: Secondary | ICD-10-CM

## 2020-08-27 NOTE — Telephone Encounter (Signed)
Scheduled appointment per 2/22 los. Spoke to patient who is aware of appointment date and time. Gave patient calendar print out.

## 2020-08-27 NOTE — Progress Notes (Signed)
Perry Nielsen   Diagnosis: Non-Hodgkin's lipoma  INTERVAL HISTORY:   Perry Nielsen returns as scheduled.  He is here with his wife.  He feels well.  He swims 4 days/week.  Good appetite.  No fever or night sweats.  He continues to have pain and cold sensitivity in the right foot.  He walks with a cane.  The pain is worse at night and in cold weather.  He takes hydrocodone in the evening.  He is not sure whether gabapentin is helping. He has received the COVID-19 and influenza vaccines. Objective:  Vital signs in last 24 hours:  Blood pressure 109/80, pulse 97, temperature 97.8 F (36.6 C), temperature source Tympanic, resp. rate 18, height 5\' 10"  (1.778 m), weight 169 lb 8 oz (76.9 kg), SpO2 96 %.    Lymphatics: No cervical, supraclavicular, axillary, or inguinal nodes Resp: Lungs clear bilaterally Cardio: Regular rate and rhythm GI: No hepatosplenomegaly, no mass Vascular: No leg edema Neuro: 4+/5 strength with dorsi flexion of the right foot   Lab Results:  Lab Results  Component Value Date   WBC 8.5 03/30/2018   HGB 16.2 03/30/2018   HCT 49.3 03/30/2018   MCV 97.8 03/30/2018   PLT 204.0 03/30/2018   NEUTROABS 3.5 03/30/2018    CMP  Lab Results  Component Value Date   NA 140 03/30/2018   K 4.6 03/30/2018   CL 103 03/30/2018   CO2 30 03/30/2018   GLUCOSE 81 03/30/2018   BUN 13 03/30/2018   CREATININE 0.90 03/30/2018   CALCIUM 9.4 03/30/2018   PROT 6.7 03/30/2018   ALBUMIN 4.3 03/30/2018   AST 20 03/30/2018   ALT 20 03/30/2018   ALKPHOS 64 03/30/2018   BILITOT 0.7 03/30/2018   GFRNONAA 44 (L) 12/05/2017   GFRAA 51 (L) 12/05/2017     Medications: I have reviewed the patient's current medications.   Assessment/Plan:  1. 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 History of thrombocytopenia predating chemotherapy 3. T7 compression fracture-likely pathologic 4. 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 Right foot pain-likely secondary to nerve compression from the sacral mass, improved 6. Diffuse erythematous rash, appearance consistent with a "drug" rash-resolved 7. History of low-grade fever likely secondary to atelectasis/postop fever versus tumor fever 8. Hepatitis B core antibody positive 10. Admission 04/18/2015 through 04/23/2015 presenting with fever; amylase and lipase elevated.   Abdominal CT showed inflammation in the left upper quadrant and associated loculated fluid collections status post drainage procedure, fluid with high levels of amylase and lipase; cultures negative; question pancreatitis.   04/26/2015 lipase mildly elevated, amylase normal.   CT abdomen/pelvis 05/14/2015 with a persistent 6.5 x 3.8 cm posterior pancreatic tail fluid collection similar in size to the 04/18/2015 CT study. Additional smaller fluid collections in the far lateral left upper quadrant; extensive patchy groundglass opacity at the lung bases, significantly increased; stable  expansile lytic lesion in the upper right sacrum. 11. 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 Severe neutropenia 05/13/2015. Likely delayed effect of Rituxan.  Resolved 13. Chest x-ray 05/15/2015 with bilateral mid to lower lung interstitial thickening with some hazy lower lung zone groundglass opacity. Partially improved on the chest x-ray 05/24/2015 14. New-onset rapid atrial fibrillation 05/29/2015 15. Admission with acute onset upper abdomen/low anterior chest pain 07/07/2015-most likely related to Neulasta, spontaneously resolved 16. Altered mental status- Potentially related to persistent delirium following a prolonged critical illness , depression, dementia, or psychosis. Brain MRI 10/22/2015 with no acute or metastatic intracranial abnormality. Stable appearance of the brain since 2016. Mental status improved 10/24/2015. He is followed by neurology     Disposition: Perry Nielsen is in remission from [place].  He continues to have neuropathic pain in the right foot related to the right sacral nerve injury.  The foot drop appears partially improved.  He will continue hydrocodone as needed.  He will wean the gabapentin over the next several days to nighttime dosing only.  Perry Nielsen will return for an office visit in 6 months.  We will administer pneumococcal vaccine when he returns in 6 months.  Betsy Coder, MD  08/27/2020  3:27 PM

## 2020-08-29 ENCOUNTER — Ambulatory Visit (INDEPENDENT_AMBULATORY_CARE_PROVIDER_SITE_OTHER): Payer: Medicare Other | Admitting: Internal Medicine

## 2020-08-29 ENCOUNTER — Other Ambulatory Visit: Payer: Self-pay

## 2020-08-29 ENCOUNTER — Encounter: Payer: Self-pay | Admitting: Internal Medicine

## 2020-08-29 VITALS — BP 120/84 | HR 74 | Temp 97.8°F | Ht 71.0 in | Wt 170.3 lb

## 2020-08-29 DIAGNOSIS — M21371 Foot drop, right foot: Secondary | ICD-10-CM | POA: Diagnosis not present

## 2020-08-29 DIAGNOSIS — F528 Other sexual dysfunction not due to a substance or known physiological condition: Secondary | ICD-10-CM | POA: Diagnosis not present

## 2020-08-29 DIAGNOSIS — F411 Generalized anxiety disorder: Secondary | ICD-10-CM

## 2020-08-29 DIAGNOSIS — K219 Gastro-esophageal reflux disease without esophagitis: Secondary | ICD-10-CM

## 2020-08-29 DIAGNOSIS — Z0000 Encounter for general adult medical examination without abnormal findings: Secondary | ICD-10-CM

## 2020-08-29 DIAGNOSIS — C8580 Other specified types of non-Hodgkin lymphoma, unspecified site: Secondary | ICD-10-CM

## 2020-08-29 MED ORDER — VARDENAFIL HCL 20 MG PO TABS: 20.0000 mg | ORAL_TABLET | Freq: Every day | ORAL | 2 refills | Status: DC | PRN

## 2020-08-29 MED ORDER — LORAZEPAM 0.5 MG PO TABS: 0.5000 mg | ORAL_TABLET | Freq: Two times a day (BID) | ORAL | 1 refills | Status: DC | PRN

## 2020-08-29 NOTE — Patient Instructions (Signed)
-Nice seeing you today!!  -Return fasting for your labs.  -Schedule follow up in 1 year or sooner as needed.   Preventive Care 79 Years and Older, Male Preventive care refers to lifestyle choices and visits with your health care provider that can promote health and wellness. This includes:  A yearly physical exam. This is also called an annual wellness visit.  Regular dental and eye exams.  Immunizations.  Screening for certain conditions.  Healthy lifestyle choices, such as: ? Eating a healthy diet. ? Getting regular exercise. ? Not using drugs or products that contain nicotine and tobacco. ? Limiting alcohol use. What can I expect for my preventive care visit? Physical exam Your health care provider will check your:  Height and weight. These may be used to calculate your BMI (body mass index). BMI is a measurement that tells if you are at a healthy weight.  Heart rate and blood pressure.  Body temperature.  Skin for abnormal spots. Counseling Your health care provider may ask you questions about your:  Past medical problems.  Family's medical history.  Alcohol, tobacco, and drug use.  Emotional well-being.  Home life and relationship well-being.  Sexual activity.  Diet, exercise, and sleep habits.  History of falls.  Memory and ability to understand (cognition).  Work and work Statistician.  Access to firearms. What immunizations do I need? Vaccines are usually given at various ages, according to a schedule. Your health care provider will recommend vaccines for you based on your age, medical history, and lifestyle or other factors, such as travel or where you work.   What tests do I need? Blood tests  Lipid and cholesterol levels. These may be checked every 5 years, or more often depending on your overall health.  Hepatitis C test.  Hepatitis B test. Screening  Lung cancer screening. You may have this screening every year starting at age 55 if  you have a 30-pack-year history of smoking and currently smoke or have quit within the past 15 years.  Colorectal cancer screening. ? All adults should have this screening starting at age 79 and continuing until age 64. ? Your health care provider may recommend screening at age 42 if you are at increased risk. ? You will have tests every 1-10 years, depending on your results and the type of screening test.  Prostate cancer screening. Recommendations will vary depending on your family history and other risks.  Genital exam to check for testicular cancer or hernias.  Diabetes screening. ? This is done by checking your blood sugar (glucose) after you have not eaten for a while (fasting). ? You may have this done every 1-3 years.  Abdominal aortic aneurysm (AAA) screening. You may need this if you are a current or former smoker.  STD (sexually transmitted disease) testing, if you are at risk. Follow these instructions at home: Eating and drinking  Eat a diet that includes fresh fruits and vegetables, whole grains, lean protein, and low-fat dairy products. Limit your intake of foods with high amounts of sugar, saturated fats, and salt.  Take vitamin and mineral supplements as recommended by your health care provider.  Do not drink alcohol if your health care provider tells you not to drink.  If you drink alcohol: ? Limit how much you have to 0-2 drinks a day. ? Be aware of how much alcohol is in your drink. In the [place] one drink equals one 12 oz bottle of beer (355 mL), one 5 oz glass of  wine (148 mL), or one 1 oz glass of hard liquor (44 mL).   Lifestyle  Take daily care of your teeth and gums. Brush your teeth every morning and night with fluoride toothpaste. Floss one time each day.  Stay active. Exercise for at least 30 minutes 5 or more days each week.  Do not use any products that contain nicotine or tobacco, such as cigarettes, e-cigarettes, and chewing tobacco. If you need  help quitting, ask your health care provider.  Do not use drugs.  If you are sexually active, practice safe sex. Use a condom or other form of protection to prevent STIs (sexually transmitted infections).  Talk with your health care provider about taking a low-dose aspirin or statin.  Find healthy ways to cope with stress, such as: ? Meditation, yoga, or listening to music. ? Journaling. ? Talking to a trusted person. ? Spending time with friends and family. Safety  Always wear your seat belt while driving or riding in a vehicle.  Do not drive: ? If you have been drinking alcohol. Do not ride with someone who has been drinking. ? When you are tired or distracted. ? While texting.  Wear a helmet and other protective equipment during sports activities.  If you have firearms in your house, make sure you follow all gun safety procedures. What's next?  Visit your health care provider once a year for an annual wellness visit.  Ask your health care provider how often you should have your eyes and teeth checked.  Stay up to date on all vaccines. This information is not intended to replace advice given to you by your health care provider. Make sure you discuss any questions you have with your health care provider. Document Revised: 03/21/2019 Document Reviewed: 06/16/2018 Elsevier Patient Education  2021 [employer].

## 2020-08-29 NOTE — Progress Notes (Signed)
Established Patient Office Visit     This visit occurred during the SARS-CoV-2 public health emergency.  Safety protocols were in place, including screening questions prior to the visit, additional usage of staff PPE, and extensive cleaning of exam room while observing appropriate contact time as indicated for disinfecting solutions.    CC/Reason for Visit: Annual preventive exam and subsequent Medicare wellness visit  HPI: Perry Nielsen. is a 80 y.o. male who is coming in today for the above mentioned reasons. 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  He has a history of erectile dysfunction for which he takes vardenafil as needed.  He also takes gabapentin and hydrocodone that are prescribed by his oncologist.  He has routine eye and dental care.  He has no perceived hearing issues.  He swims a mile 4 days a week.  He has no acute issues today.   Past Medical/Surgical History: Past Medical History:  Diagnosis Date  . ALLERGIC RHINITIS 08/03/2007  . BACK PAIN 09/18/2009  . 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 CAROTID BRUIT, LEFT 08/07/2008  . 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 Foot drop    rt foot  . GERD 02/18/2007  . HAMMER TOE 08/03/2007  . 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 KNEE PAIN, LEFT, CHRONIC 08/07/2008  . Lymphoma ([place])    right hip  . Rash, skin    torso  . Skin cancer   . Sleeping difficulty     Past Surgical History:  Procedure Laterality Date  . CATARACT EXTRACTION W/ INTRAOCULAR LENS  IMPLANT, BILATERAL Bilateral   . KNEE ARTHROSCOPY     left  . 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 SHOULDER OPEN ROTATOR CUFF REPAIR  07/06/05   right  . TONSILLECTOMY AND  ADENOIDECTOMY    . TRANSURETHRAL RESECTION OF PROSTATE      Social History:  reports that he has never smoked. He has never used smokeless tobacco. He reports current alcohol use. He reports that he does not use drugs.  Allergies: Allergies  Allergen Reactions  . Antihistamines, Diphenhydramine-Type     CAUSES PROSTATE TO SWELL & as a child had hyperactivity when took Benadryl Pt took Pepcid w/ Benadryl PO and did fine per Pt & his wife    Family History:  Family History  Problem Relation Age of Onset  . Pulmonary fibrosis Mother   . Uterine cancer Mother   . Prostate cancer Father   . Heart disease Brother   . Heart attack Other   . Aneurysm Other        aortic  . Coronary artery disease Sister      Current Outpatient Medications:  .  HYDROcodone-acetaminophen (NORCO) 5-325 MG tablet, Take 1 tablet by mouth 2 (two) times daily as needed for moderate pain., Disp: 120 tablet, Rfl: 0 .  oxymetazoline (AFRIN) 0.05 % nasal spray, Place 1 spray into both nostrils 2 (two) times daily as needed for congestion., Disp: , Rfl:  .  Polyethyl Glycol-Propyl Glycol (SYSTANE OP), Apply 1 drop to eye daily as needed (dryness)., Disp: , Rfl:  .  Polyethylene Glycol 3350 (MIRALAX PO), Take 17 g by mouth daily as needed., Disp: ,  Rfl:  .  LORazepam (ATIVAN) 0.5 MG tablet, Take 1 tablet (0.5 mg total) by mouth 2 (two) times daily as needed for anxiety., Disp: 60 tablet, Rfl: 1 .  vardenafil (LEVITRA) 20 MG tablet, Take 1 tablet (20 mg total) by mouth daily as needed for erectile dysfunction., Disp: 20 tablet, Rfl: 2  Review of Systems:  Constitutional: Denies fever, chills, diaphoresis, appetite change and fatigue.  HEENT: Denies photophobia, eye pain, redness, hearing loss, ear pain, congestion, sore throat, rhinorrhea, sneezing, mouth sores, trouble swallowing, neck pain, neck stiffness and tinnitus.   Respiratory: Denies SOB, DOE, cough, chest tightness,  and wheezing.   Cardiovascular:  Denies chest pain, palpitations and leg swelling.  Gastrointestinal: Denies nausea, vomiting, abdominal pain, diarrhea, constipation, blood in stool and abdominal distention.  Genitourinary: Denies dysuria, urgency, frequency, hematuria, flank pain and difficulty urinating.  Endocrine: Denies: hot or cold intolerance, sweats, changes in hair or nails, polyuria, polydipsia. Musculoskeletal: Denies myalgias, back pain, joint swelling, arthralgias. Skin: Denies pallor, rash and wound.  Neurological: Denies dizziness, seizures, syncope, weakness, light-headedness, numbness and headaches.  Hematological: Denies adenopathy. Easy bruising, personal or family bleeding history  Psychiatric/Behavioral: Denies suicidal ideation, mood changes, confusion, nervousness, sleep disturbance and agitation    Physical Exam: Vitals:   08/29/20 1354  BP: 120/84  Pulse: 74  Temp: 97.8 F (36.6 C)  TempSrc: Oral  SpO2: 95%  Weight: 170 lb 4.8 oz (77.2 kg)  Height: 5\' 11"  (1.803 m)    Body mass index is 23.75 kg/m.   Constitutional: NAD, calm, comfortable ambulates with a cane Eyes: PERRL, lids and conjunctivae normal, wears corrective lenses ENMT: Mucous membranes are moist. Posterior pharynx clear of any exudate or lesions. Normal dentition. Tympanic membrane is pearly white, no erythema or bulging. Neck: normal, supple, no masses, no thyromegaly Respiratory: clear to auscultation bilaterally, no wheezing, no crackles. Normal respiratory effort. No accessory muscle use.  Cardiovascular: Regular rate and rhythm, no murmurs / rubs / gallops. No extremity edema. 2+ pedal pulses.   Abdomen: no tenderness, no masses palpated. No hepatosplenomegaly. Bowel sounds positive.  Musculoskeletal: no clubbing / cyanosis.  He has a right foot drop and is wearing a brace. Skin: no rashes, lesions, ulcers. No induration Neurologic: CN 2-12 grossly intact. Sensation intact, DTR normal. Strength 5/5 in all 4.   Psychiatric: Normal judgment and insight. Alert and oriented x 3. Normal mood.    Subsequent Medicare wellness visit   1. Risk factors, based on past  M,S,F -cardiovascular disease risk factors include age, gender   2.  Physical activities: He is quite physically active for his age and swims half a mile to 1 mile 4 days a week   3.  Depression/mood:  Stable, not depressed   4.  Hearing:  No perceived issues   5.  ADL's: Independent in all ADLs   6.  Fall risk:  Moderate to high fall risk due to his foot drop   7.  Home safety: No problems identified   8.  Height weight, and visual acuity: height and weight as above, vision:   Visual Acuity Screening   Right eye Left eye Both eyes  Without correction:     With correction: 20/20 20/20 20/32      9.  Counseling:  All health maintenance items are up-to-date, advised to continue physical activity as tolerated   10. Lab orders based on risk factors: Laboratory update will be reviewed   11. Referral :  None today   12.  Care plan:  Follow-up with me in 12 months   13. Cognitive assessment:  No cognitive impairment   14. Screening: Patient provided with a written and personalized 5-10 year screening schedule in the AVS.   yes   15. Provider List Update:   PCP, oncologist  48. Advance Directives: Full code   17. Opioids: Patient is on chronic opioids.  These are prescribed through his oncologist.  Has not displayed any signs of an opioid-use disorder.   L[facility] Office Visit from 08/29/2020 in [facility]  PHQ-9 Total Score 1      Fall Risk  08/29/2020 03/30/2018 03/01/2018 12/04/2017 04/01/2016  Falls in the past year? 0 No No No No  Number falls in past yr: 0 - - - -  Injury with Fall? 0 - - - -     Impression and Plan:  Encounter for preventive health examination  -He has routine eye and dental care. -All immunizations are up-to-date including COVID and flu. -Screening labs today. -Healthy  lifestyle discussed in detail. -He has elected to defer all cancer screening due to his age, I agree.  Anxiety state  - Plan: LORazepam (ATIVAN) 0.5 MG tablet, he takes this only as needed very sporadically.  Right foot drop  -Noted  ERECTILE DYSFUNCTION, MILD  - Plan: vardenafil (LEVITRA) 20 MG tablet  Lymphoma, large cell (H[facility])  -Stable, followed by oncology.  Gastroesophageal reflux disease without esophagitis -Well-controlled, not on daily medication.   Patient Instructions   -Nice seeing you today!!  -Return fasting for your labs.  -Schedule follow up in 1 year or sooner as needed.   Preventive Care 59 Years and Older, Male Preventive care refers to lifestyle choices and visits with your health care provider that can promote health and wellness. This includes:  A yearly physical exam. This is also called an annual wellness visit.  Regular dental and eye exams.  Immunizations.  Screening for certain conditions.  Healthy lifestyle choices, such as: ? Eating a healthy diet. ? Getting regular exercise. ? Not using drugs or products that contain nicotine and tobacco. ? Limiting alcohol use. What can I expect for my preventive care visit? Physical exam Your health care provider will check your:  Height and weight. These may be used to calculate your BMI (body mass index). BMI is a measurement that tells if you are at a healthy weight.  Heart rate and blood pressure.  Body temperature.  Skin for abnormal spots. Counseling Your health care provider may ask you questions about your:  Past medical problems.  Family's medical history.  Alcohol, tobacco, and drug use.  Emotional well-being.  Home life and relationship well-being.  Sexual activity.  Diet, exercise, and sleep habits.  History of falls.  Memory and ability to understand (cognition).  Work and work Statistician.  Access to firearms. What immunizations do I need? Vaccines are usually  given at various ages, according to a schedule. Your health care provider will recommend vaccines for you based on your age, medical history, and lifestyle or other factors, such as travel or where you work.   What tests do I need? Blood tests  Lipid and cholesterol levels. These may be checked every 5 years, or more often depending on your overall health.  Hepatitis C test.  Hepatitis B test. Screening  Lung cancer screening. You may have this screening every year starting at age 67 if you have a 30-pack-year history of smoking and currently smoke or have quit  within the past 15 years.  Colorectal cancer screening. ? All adults should have this screening starting at age 55 and continuing until age 8. ? Your health care provider may recommend screening at age 68 if you are at increased risk. ? You will have tests every 1-10 years, depending on your results and the type of screening test.  Prostate cancer screening. Recommendations will vary depending on your family history and other risks.  Genital exam to check for testicular cancer or hernias.  Diabetes screening. ? This is done by checking your blood sugar (glucose) after you have not eaten for a while (fasting). ? You may have this done every 1-3 years.  Abdominal aortic aneurysm (AAA) screening. You may need this if you are a current or former smoker.  STD (sexually transmitted disease) testing, if you are at risk. Follow these instructions at home: Eating and drinking  Eat a diet that includes fresh fruits and vegetables, whole grains, lean protein, and low-fat dairy products. Limit your intake of foods with high amounts of sugar, saturated fats, and salt.  Take vitamin and mineral supplements as recommended by your health care provider.  Do not drink alcohol if your health care provider tells you not to drink.  If you drink alcohol: ? Limit how much you have to 0-2 drinks a day. ? Be aware of how much alcohol is in  your drink. In the [place] one drink equals one 12 oz bottle of beer (355 mL), one 5 oz glass of wine (148 mL), or one 1 oz glass of hard liquor (44 mL).   Lifestyle  Take daily care of your teeth and gums. Brush your teeth every morning and night with fluoride toothpaste. Floss one time each day.  Stay active. Exercise for at least 30 minutes 5 or more days each week.  Do not use any products that contain nicotine or tobacco, such as cigarettes, e-cigarettes, and chewing tobacco. If you need help quitting, ask your health care provider.  Do not use drugs.  If you are sexually active, practice safe sex. Use a condom or other form of protection to prevent STIs (sexually transmitted infections).  Talk with your health care provider about taking a low-dose aspirin or statin.  Find healthy ways to cope with stress, such as: ? Meditation, yoga, or listening to music. ? Journaling. ? Talking to a trusted person. ? Spending time with friends and family. Safety  Always wear your seat belt while driving or riding in a vehicle.  Do not drive: ? If you have been drinking alcohol. Do not ride with someone who has been drinking. ? When you are tired or distracted. ? While texting.  Wear a helmet and other protective equipment during sports activities.  If you have firearms in your house, make sure you follow all gun safety procedures. What's next?  Visit your health care provider once a year for an annual wellness visit.  Ask your health care provider how often you should have your eyes and teeth checked.  Stay up to date on all vaccines. This information is not intended to replace advice given to you by your health care provider. Make sure you discuss any questions you have with your health care provider. Document Revised: 03/21/2019 Document Reviewed: 06/16/2018 Elsevier Patient Education  2021 [place], MD [facility]  [facility]

## 2020-09-03 ENCOUNTER — Other Ambulatory Visit: Payer: Self-pay

## 2020-09-03 ENCOUNTER — Encounter: Payer: Self-pay | Admitting: Internal Medicine

## 2020-09-03 ENCOUNTER — Encounter: Payer: Self-pay | Admitting: Dermatology

## 2020-09-03 ENCOUNTER — Other Ambulatory Visit (INDEPENDENT_AMBULATORY_CARE_PROVIDER_SITE_OTHER): Payer: Medicare Other

## 2020-09-03 ENCOUNTER — Ambulatory Visit: Payer: Medicare Other | Admitting: Dermatology

## 2020-09-03 ENCOUNTER — Other Ambulatory Visit: Payer: Self-pay | Admitting: Internal Medicine

## 2020-09-03 DIAGNOSIS — C8580 Other specified types of non-Hodgkin lymphoma, unspecified site: Secondary | ICD-10-CM | POA: Diagnosis not present

## 2020-09-03 DIAGNOSIS — Z1283 Encounter for screening for malignant neoplasm of skin: Secondary | ICD-10-CM

## 2020-09-03 DIAGNOSIS — L820 Inflamed seborrheic keratosis: Secondary | ICD-10-CM | POA: Diagnosis not present

## 2020-09-03 DIAGNOSIS — L821 Other seborrheic keratosis: Secondary | ICD-10-CM

## 2020-09-03 DIAGNOSIS — Z0000 Encounter for general adult medical examination without abnormal findings: Secondary | ICD-10-CM | POA: Diagnosis not present

## 2020-09-03 DIAGNOSIS — E039 Hypothyroidism, unspecified: Secondary | ICD-10-CM | POA: Insufficient documentation

## 2020-09-03 DIAGNOSIS — E538 Deficiency of other specified B group vitamins: Secondary | ICD-10-CM | POA: Insufficient documentation

## 2020-09-03 DIAGNOSIS — R7989 Other specified abnormal findings of blood chemistry: Secondary | ICD-10-CM | POA: Diagnosis not present

## 2020-09-03 LAB — CBC WITH DIFFERENTIAL/PLATELET
Basophils Absolute: 0.1 10*3/uL (ref 0.0–0.1)
Basophils Relative: 1 % (ref 0.0–3.0)
Eosinophils Absolute: 0.4 10*3/uL (ref 0.0–0.7)
Eosinophils Relative: 4.5 % (ref 0.0–5.0)
HCT: 44.1 % (ref 39.0–52.0)
Hemoglobin: 14.6 g/dL (ref 13.0–17.0)
Lymphocytes Relative: 32.5 % (ref 12.0–46.0)
Lymphs Abs: 2.9 10*3/uL (ref 0.7–4.0)
MCHC: 33.2 g/dL (ref 30.0–36.0)
MCV: 96.9 fl (ref 78.0–100.0)
Monocytes Absolute: 2.1 10*3/uL — ABNORMAL HIGH (ref 0.1–1.0)
Monocytes Relative: 23.3 % — ABNORMAL HIGH (ref 3.0–12.0)
Neutro Abs: 3.4 10*3/uL (ref 1.4–7.7)
Neutrophils Relative %: 38.7 % — ABNORMAL LOW (ref 43.0–77.0)
Platelets: 232 10*3/uL (ref 150.0–400.0)
RBC: 4.55 Mil/uL (ref 4.22–5.81)
RDW: 13.9 % (ref 11.5–15.5)
WBC: 8.9 10*3/uL (ref 4.0–10.5)

## 2020-09-03 LAB — COMPREHENSIVE METABOLIC PANEL
ALT: 16 U/L (ref 0–53)
AST: 17 U/L (ref 0–37)
Albumin: 4 g/dL (ref 3.5–5.2)
Alkaline Phosphatase: 63 U/L (ref 39–117)
BUN: 15 mg/dL (ref 6–23)
CO2: 29 mEq/L (ref 19–32)
Calcium: 9.2 mg/dL (ref 8.4–10.5)
Chloride: 102 mEq/L (ref 96–112)
Creatinine, Ser: 0.95 mg/dL (ref 0.40–1.50)
GFR: 76.36 mL/min (ref >60.00)
Glucose, Bld: 87 mg/dL (ref 70–99)
Potassium: 4 mEq/L (ref 3.5–5.1)
Sodium: 139 mEq/L (ref 135–145)
Total Bilirubin: 0.8 mg/dL (ref 0.2–1.2)
Total Protein: 6.3 g/dL (ref 6.0–8.3)

## 2020-09-03 LAB — VITAMIN B12: Vitamin B-12: 122 pg/mL — ABNORMAL LOW (ref 211–911)

## 2020-09-03 LAB — HEMOGLOBIN A1C: Hgb A1c MFr Bld: 5.7 % (ref 4.6–6.5)

## 2020-09-03 LAB — LIPID PANEL
Cholesterol: 192 mg/dL (ref 0–200)
HDL: 42.9 mg/dL (ref >39.00)
LDL Cholesterol: 126 mg/dL — ABNORMAL HIGH (ref 0–99)
NonHDL: 149.43
Total CHOL/HDL Ratio: 4
Triglycerides: 115 mg/dL (ref 0.0–149.0)
VLDL: 23 mg/dL (ref 0.0–40.0)

## 2020-09-03 LAB — TSH: TSH: 5.79 u[IU]/mL — ABNORMAL HIGH (ref 0.35–4.50)

## 2020-09-03 LAB — VITAMIN D 25 HYDROXY (VIT D DEFICIENCY, FRACTURES): VITD: 41.92 ng/mL (ref 30.00–100.00)

## 2020-09-03 MED ORDER — CYANOCOBALAMIN 1000 MCG/ML IJ SOLN: INTRAMUSCULAR | 3 refills | Status: DC

## 2020-09-03 MED ORDER — BD SAFETYGLIDE SYRINGE/NEEDLE 25G X 1" 3 ML MISC
11 refills | Status: DC
Start: 2020-09-03 — End: 2022-06-11

## 2020-09-04 ENCOUNTER — Other Ambulatory Visit: Payer: Self-pay | Admitting: Internal Medicine

## 2020-09-04 ENCOUNTER — Encounter: Payer: Self-pay | Admitting: Internal Medicine

## 2020-09-04 DIAGNOSIS — E039 Hypothyroidism, unspecified: Secondary | ICD-10-CM

## 2020-09-04 LAB — T4, FREE: Free T4: 0.79 ng/dL (ref 0.60–1.60)

## 2020-09-04 LAB — T3, FREE: T3, Free: 3.5 pg/mL (ref 2.3–4.2)

## 2020-09-04 MED ORDER — LEVOTHYROXINE SODIUM 25 MCG PO TABS: 25.0000 ug | ORAL_TABLET | Freq: Every day | ORAL | 1 refills | Status: DC

## 2020-09-06 ENCOUNTER — Telehealth: Payer: Self-pay | Admitting: Internal Medicine

## 2020-09-06 NOTE — Telephone Encounter (Signed)
pts spouse is calling in wanting to know if the pt can come here and get his B-12 inj instead of them doing it themselves they are just uncomfortable with doing so per spouse she stated that they went to the pharmacy to have it done.  Pt would like to have a call back.

## 2020-09-06 NOTE — Telephone Encounter (Signed)
Nurse visit scheduled for B12 injection

## 2020-09-12 ENCOUNTER — Other Ambulatory Visit: Payer: Self-pay

## 2020-09-12 ENCOUNTER — Ambulatory Visit (INDEPENDENT_AMBULATORY_CARE_PROVIDER_SITE_OTHER): Payer: Medicare Other | Admitting: *Deleted

## 2020-09-12 DIAGNOSIS — E538 Deficiency of other specified B group vitamins: Secondary | ICD-10-CM

## 2020-09-12 MED ORDER — CYANOCOBALAMIN 1000 MCG/ML IJ SOLN
1000.0000 ug | Freq: Once | INTRAMUSCULAR | Status: AC
  Administered 2020-09-12: 1000 ug via INTRAMUSCULAR

## 2020-09-12 NOTE — Progress Notes (Signed)
Per orders of Dr. Hernandez, injection of Cyanocobalamin 1000mcg given by Funderburk, Jo A. Patient tolerated injection well. 

## 2020-09-13 ENCOUNTER — Encounter: Payer: Self-pay | Admitting: Dermatology

## 2020-09-13 NOTE — Progress Notes (Signed)
° °  Follow-Up Visit   Subjective  Perry Nielsen. is a 80 y.o. male who presents for the following: Skin Problem and Annual Exam (Patient has bumps around neck, face, and chest. Per patient their dry and ugly x years. Per patient, he has one big bump on left side of face above the ear that had previously been frozen by Dr. Denna Haggard that continues to be bothersome. ).  Annual skin check Location: Spots on chest above left ear sometimes itch. Duration:  Quality:  Associated Signs/Symptoms: Modifying Factors:  Severity:  Timing: Context:   Objective  Well appearing patient in no apparent distress; mood and affect are within normal limits. Objective  Head - Anterior (Face): Head to waist exam today no signs of atypical moles, melanoma or non mole skin cancer.   Objective  Mid Back: Dozens of noninflamed flattopped brown 2 to 8 mm papules  Objective  Right Breast: Lightly inflamed pinktan textured papule.  Similar lesion on left parietal area.    All skin waist up examined.   Assessment & Plan    Skin exam for malignant neoplasm Head - Anterior (Face)  Yearly skin check  Seborrheic keratosis Mid Back  Intervention not currently necessary.  Inflamed seborrheic keratosis Right Breast  No treatment today, patients wife will watch and if any change the patient's wife will contact the office.      I, Lavonna Monarch, MD, have reviewed all documentation for this visit.  The documentation on 09/13/20 for the exam, diagnosis, procedures, and orders are all accurate and complete.

## 2020-09-17 DIAGNOSIS — H04123 Dry eye syndrome of bilateral lacrimal glands: Secondary | ICD-10-CM | POA: Diagnosis not present

## 2020-09-17 DIAGNOSIS — Z961 Presence of intraocular lens: Secondary | ICD-10-CM | POA: Diagnosis not present

## 2020-09-19 ENCOUNTER — Ambulatory Visit: Payer: Medicare Other

## 2020-09-19 DIAGNOSIS — M21371 Foot drop, right foot: Secondary | ICD-10-CM | POA: Diagnosis not present

## 2020-09-19 DIAGNOSIS — M6281 Muscle weakness (generalized): Secondary | ICD-10-CM | POA: Diagnosis not present

## 2020-09-19 DIAGNOSIS — L03032 Cellulitis of left toe: Secondary | ICD-10-CM | POA: Diagnosis not present

## 2020-09-20 ENCOUNTER — Other Ambulatory Visit: Payer: Self-pay

## 2020-09-20 ENCOUNTER — Ambulatory Visit (INDEPENDENT_AMBULATORY_CARE_PROVIDER_SITE_OTHER): Payer: Medicare Other | Admitting: *Deleted

## 2020-09-20 ENCOUNTER — Ambulatory Visit: Payer: Medicare Other

## 2020-09-20 DIAGNOSIS — E538 Deficiency of other specified B group vitamins: Secondary | ICD-10-CM

## 2020-09-20 MED ORDER — CYANOCOBALAMIN 1000 MCG/ML IJ SOLN
1000.0000 ug | Freq: Once | INTRAMUSCULAR | Status: AC
  Administered 2020-09-20: 1000 ug via INTRAMUSCULAR

## 2020-09-20 NOTE — Progress Notes (Signed)
Per orders of Dr. Jerilee Hoh, injection of B12 given by Westley Hummer. Patient tolerated injection well. Patient brought his own B12

## 2020-09-27 ENCOUNTER — Ambulatory Visit: Payer: Medicare Other

## 2020-09-27 ENCOUNTER — Ambulatory Visit (INDEPENDENT_AMBULATORY_CARE_PROVIDER_SITE_OTHER): Payer: Medicare Other

## 2020-09-27 ENCOUNTER — Telehealth: Payer: Self-pay | Admitting: *Deleted

## 2020-09-27 ENCOUNTER — Other Ambulatory Visit: Payer: Self-pay | Admitting: Nurse Practitioner

## 2020-09-27 ENCOUNTER — Other Ambulatory Visit: Payer: Self-pay

## 2020-09-27 DIAGNOSIS — E538 Deficiency of other specified B group vitamins: Secondary | ICD-10-CM

## 2020-09-27 DIAGNOSIS — C8580 Other specified types of non-Hodgkin lymphoma, unspecified site: Secondary | ICD-10-CM

## 2020-09-27 MED ORDER — HYDROCODONE-ACETAMINOPHEN 5-325 MG PO TABS: 1.0000 | ORAL_TABLET | Freq: Two times a day (BID) | ORAL | 0 refills | Status: DC | PRN

## 2020-09-27 MED ORDER — CYANOCOBALAMIN 1000 MCG/ML IJ SOLN
1000.0000 ug | Freq: Once | INTRAMUSCULAR | Status: AC
  Administered 2020-09-27: 1000 ug via INTRAMUSCULAR

## 2020-09-27 NOTE — Telephone Encounter (Signed)
Pt left a message stating he needs a refill of hydrocodone by Tuesday,  10/01/20

## 2020-09-27 NOTE — Progress Notes (Signed)
Per orders of Dr. Isaac Bliss, injection of Cyanocobalamin given by Mykal L Good. Patient tolerated injection well. Patient supplied Cyanocobalamin

## 2020-09-30 DIAGNOSIS — M6281 Muscle weakness (generalized): Secondary | ICD-10-CM | POA: Diagnosis not present

## 2020-09-30 DIAGNOSIS — M21371 Foot drop, right foot: Secondary | ICD-10-CM | POA: Diagnosis not present

## 2020-09-30 DIAGNOSIS — L03032 Cellulitis of left toe: Secondary | ICD-10-CM | POA: Diagnosis not present

## 2020-10-03 ENCOUNTER — Other Ambulatory Visit: Payer: Self-pay

## 2020-10-04 ENCOUNTER — Ambulatory Visit (INDEPENDENT_AMBULATORY_CARE_PROVIDER_SITE_OTHER): Payer: Medicare Other | Admitting: *Deleted

## 2020-10-04 DIAGNOSIS — E538 Deficiency of other specified B group vitamins: Secondary | ICD-10-CM

## 2020-10-04 MED ORDER — CYANOCOBALAMIN 1000 MCG/ML IJ SOLN
1000.0000 ug | Freq: Once | INTRAMUSCULAR | Status: AC
  Administered 2020-10-04: 1000 ug via INTRAMUSCULAR

## 2020-10-04 NOTE — Progress Notes (Signed)
Per orders of Dr. Jerilee Hoh, injection of B12 given by Westley Hummer. Patient tolerated injection well.  Patient supplied own B12

## 2020-10-15 ENCOUNTER — Other Ambulatory Visit: Payer: Self-pay

## 2020-10-16 ENCOUNTER — Other Ambulatory Visit (INDEPENDENT_AMBULATORY_CARE_PROVIDER_SITE_OTHER): Payer: Medicare Other

## 2020-10-16 DIAGNOSIS — E039 Hypothyroidism, unspecified: Secondary | ICD-10-CM | POA: Diagnosis not present

## 2020-10-16 LAB — TSH: TSH: 1.7 u[IU]/mL (ref 0.35–4.50)

## 2020-10-17 ENCOUNTER — Telehealth: Payer: Self-pay | Admitting: Internal Medicine

## 2020-10-17 ENCOUNTER — Ambulatory Visit: Payer: Medicare Other | Admitting: Physician Assistant

## 2020-10-17 NOTE — Telephone Encounter (Signed)
Pt is returning your call and want a call back. 

## 2020-10-17 NOTE — Telephone Encounter (Signed)
Reviewed lab results with patient.

## 2020-10-23 ENCOUNTER — Ambulatory Visit: Payer: Medicare Other

## 2020-10-23 ENCOUNTER — Other Ambulatory Visit: Payer: Self-pay

## 2020-10-23 DIAGNOSIS — Z0000 Encounter for general adult medical examination without abnormal findings: Secondary | ICD-10-CM

## 2020-10-23 NOTE — Patient Instructions (Signed)
Perry Nielsen , Thank you for taking time to come for your Medicare Wellness Visit. I appreciate your ongoing commitment to your health goals. Please review the following plan we discussed and let me know if I can assist you in the future.   Screening recommendations/referrals: Colonoscopy: No longer required  Recommended yearly ophthalmology/optometry visit for glaucoma screening and checkup Recommended yearly dental visit for hygiene and checkup  Vaccinations: Influenza vaccine: Up to date Pneumococcal vaccine: Up to date Tdap vaccine: Up to date Shingles vaccine: Completed 05/10/18 & 07/12/18   Covid-19: Completed 2/6, 3/3, & 03/06/20  Advanced directives: Please bring a copy of your health care power of attorney and living will to the office at your convenience.  Conditions/risks identified: None at this time   Next appointment: Follow up in one year for your annual wellness visit.   Preventive Care 79 Years and Older, Male Preventive care refers to lifestyle choices and visits with your health care provider that can promote health and wellness. What does preventive care include?  A yearly physical exam. This is also called an annual well check.  Dental exams once or twice a year.  Routine eye exams. Ask your health care provider how often you should have your eyes checked.  Personal lifestyle choices, including:  Daily care of your teeth and gums.  Regular physical activity.  Eating a healthy diet.  Avoiding tobacco and drug use.  Limiting alcohol use.  Practicing safe sex.  Taking low doses of aspirin every day.  Taking vitamin and mineral supplements as recommended by your health care provider. What happens during an annual well check? The services and screenings done by your health care provider during your annual well check will depend on your age, overall health, lifestyle risk factors, and family history of disease. Counseling  Your health care provider may ask  you questions about your:  Alcohol use.  Tobacco use.  Drug use.  Emotional well-being.  Home and relationship well-being.  Sexual activity.  Eating habits.  History of falls.  Memory and ability to understand (cognition).  Work and work Statistician. Screening  You may have the following tests or measurements:  Height, weight, and BMI.  Blood pressure.  Lipid and cholesterol levels. These may be checked every 5 years, or more frequently if you are over 49 years old.  Skin check.  Lung cancer screening. You may have this screening every year starting at age 76 if you have a 30-pack-year history of smoking and currently smoke or have quit within the past 15 years.  Fecal occult blood test (FOBT) of the stool. You may have this test every year starting at age 47.  Flexible sigmoidoscopy or colonoscopy. You may have a sigmoidoscopy every 5 years or a colonoscopy every 10 years starting at age 79.  Prostate cancer screening. Recommendations will vary depending on your family history and other risks.  Hepatitis C blood test.  Hepatitis B blood test.  Sexually transmitted disease (STD) testing.  Diabetes screening. This is done by checking your blood sugar (glucose) after you have not eaten for a while (fasting). You may have this done every 1-3 years.  Abdominal aortic aneurysm (AAA) screening. You may need this if you are a current or former smoker.  Osteoporosis. You may be screened starting at age 2 if you are at high risk. Talk with your health care provider about your test results, treatment options, and if necessary, the need for more tests. Vaccines  Your health care  provider may recommend certain vaccines, such as:  Influenza vaccine. This is recommended every year.  Tetanus, diphtheria, and acellular pertussis (Tdap, Td) vaccine. You may need a Td booster every 10 years.  Zoster vaccine. You may need this after age 27.  Pneumococcal 13-valent conjugate  (PCV13) vaccine. One dose is recommended after age 54.  Pneumococcal polysaccharide (PPSV23) vaccine. One dose is recommended after age 54. Talk to your health care provider about which screenings and vaccines you need and how often you need them. This information is not intended to replace advice given to you by your health care provider. Make sure you discuss any questions you have with your health care provider. Document Released: 07/19/2015 Document Revised: 03/11/2016 Document Reviewed: 04/23/2015 Elsevier Interactive Patient Education  2017 [place] Prevention in the Home Falls can cause injuries. They can happen to people of all ages. There are many things you can do to make your home safe and to help prevent falls. What can I do on the outside of my home?  Regularly fix the edges of walkways and driveways and fix any cracks.  Remove anything that might make you trip as you walk through a door, such as a raised step or threshold.  Trim any bushes or trees on the path to your home.  Use bright outdoor lighting.  Clear any walking paths of anything that might make someone trip, such as rocks or tools.  Regularly check to see if handrails are loose or broken. Make sure that both sides of any steps have handrails.  Any raised decks and porches should have guardrails on the edges.  Have any leaves, snow, or ice cleared regularly.  Use sand or salt on walking paths during winter.  Clean up any spills in your garage right away. This includes oil or grease spills. What can I do in the bathroom?  Use night lights.  Install grab bars by the toilet and in the tub and shower. Do not use towel bars as grab bars.  Use non-skid mats or decals in the tub or shower.  If you need to sit down in the shower, use a plastic, non-slip stool.  Keep the floor dry. Clean up any water that spills on the floor as soon as it happens.  Remove soap buildup in the tub or shower  regularly.  Attach bath mats securely with double-sided non-slip rug tape.  Do not have throw rugs and other things on the floor that can make you trip. What can I do in the bedroom?  Use night lights.  Make sure that you have a light by your bed that is easy to reach.  Do not use any sheets or blankets that are too big for your bed. They should not hang down onto the floor.  Have a firm chair that has side arms. You can use this for support while you get dressed.  Do not have throw rugs and other things on the floor that can make you trip. What can I do in the kitchen?  Clean up any spills right away.  Avoid walking on wet floors.  Keep items that you use a lot in easy-to-reach places.  If you need to reach something above you, use a strong step stool that has a grab bar.  Keep electrical cords out of the way.  Do not use floor polish or wax that makes floors slippery. If you must use wax, use non-skid floor wax.  Do not have throw  rugs and other things on the floor that can make you trip. What can I do with my stairs?  Do not leave any items on the stairs.  Make sure that there are handrails on both sides of the stairs and use them. Fix handrails that are broken or loose. Make sure that handrails are as long as the stairways.  Check any carpeting to make sure that it is firmly attached to the stairs. Fix any carpet that is loose or worn.  Avoid having throw rugs at the top or bottom of the stairs. If you do have throw rugs, attach them to the floor with carpet tape.  Make sure that you have a light switch at the top of the stairs and the bottom of the stairs. If you do not have them, ask someone to add them for you. What else can I do to help prevent falls?  Wear shoes that:  Do not have high heels.  Have rubber bottoms.  Are comfortable and fit you well.  Are closed at the toe. Do not wear sandals.  If you use a stepladder:  Make sure that it is fully  opened. Do not climb a closed stepladder.  Make sure that both sides of the stepladder are locked into place.  Ask someone to hold it for you, if possible.  Clearly mark and make sure that you can see:  Any grab bars or handrails.  First and last steps.  Where the edge of each step is.  Use tools that help you move around (mobility aids) if they are needed. These include:  Canes.  Walkers.  Scooters.  Crutches.  Turn on the lights when you go into a dark area. Replace any light bulbs as soon as they burn out.  Set up your furniture so you have a clear path. Avoid moving your furniture around.  If any of your floors are uneven, fix them.  If there are any pets around you, be aware of where they are.  Review your medicines with your doctor. Some medicines can make you feel dizzy. This can increase your chance of falling. Ask your doctor what other things that you can do to help prevent falls. This information is not intended to replace advice given to you by your health care provider. Make sure you discuss any questions you have with your health care provider. Document Released: 04/18/2009 Document Revised: 11/28/2015 Document Reviewed: 07/27/2014 Elsevier Interactive Patient Education  2017 [employer].

## 2020-10-23 NOTE — Progress Notes (Signed)
Virtual Visit via Telephone Note  I connected with  Perry Nielsen. on 10/23/20 at  2:30 PM EDT by telephone and verified that I am speaking with the correct person using two identifiers.  Medicare Annual Wellness visit completed telephonically due to Covid-19 pandemic.   Persons participating in this call: This Health Coach and this patient and wife Perry Nielsen   Location: Patient: Home Provider: Office   I discussed the limitations, risks, security and privacy concerns of performing an evaluation and management service by telephone and the availability of in person appointments. The patient expressed understanding and agreed to proceed.  Unable to perform video visit due to video visit attempted and failed and/or patient does not have video capability.   Some vital signs may be absent or patient reported.   Willette Brace, LPN    Subjective:   Perry Meadow. is a 80 y.o. male who presents for Medicare Annual/Subsequent preventive examination.  Review of Systems     Cardiac Risk Factors include: advanced age (>11men, >58 women);male gender     Objective:    There were no vitals filed for this visit. There is no height or weight on file to calculate BMI.  Advanced Directives 10/23/2020 08/27/2020 08/14/2019 08/11/2018 03/01/2018 07/23/2017 09/22/2016  Does Patient Have a Medical Advance Directive? Yes Yes Yes Yes Yes Yes Yes  Type of Paramedic of [place];Living will [place];Living will Living will - Living will [place];Living will  Does patient want to make changes to medical advance directive? - No - Patient declined - No - Patient declined - - -  Copy of [place] in Chart? No - copy requested No - copy requested No - copy requested - - - Yes  Would patient like information on creating a medical advance directive? - - - - - - -    Current Medications  (verified) Outpatient Encounter Medications as of 10/23/2020  Medication Sig  . cyanocobalamin (,VITAMIN B-12,) 1000 MCG/ML injection Inject 66ml in deltoid once weekly for 4 weeks, then inject 1 ml once a month thereafter  . HYDROcodone-acetaminophen (NORCO) 5-325 MG tablet Take 1 tablet by mouth 2 (two) times daily as needed for moderate pain.  Marland Kitchen levothyroxine (SYNTHROID) 25 MCG tablet Take 1 tablet (25 mcg total) by mouth daily before breakfast.  . LORazepam (ATIVAN) 0.5 MG tablet Take 1 tablet (0.5 mg total) by mouth 2 (two) times daily as needed for anxiety.  Marland Kitchen oxymetazoline (AFRIN) 0.05 % nasal spray Place 1 spray into both nostrils 2 (two) times daily as needed for congestion.  Vladimir Faster Glycol-Propyl Glycol (SYSTANE OP) Apply 1 drop to eye daily as needed (dryness).  . SYRINGE-NEEDLE, DISP, 3 ML (BD SAFETYGLIDE SYRINGE/NEEDLE) 25G X 1" 3 ML MISC Use for B12 injections  . vardenafil (LEVITRA) 20 MG tablet Take 1 tablet (20 mg total) by mouth daily as needed for erectile dysfunction.  . Polyethylene Glycol 3350 (MIRALAX PO) Take 17 g by mouth daily as needed. (Patient not taking: Reported on 10/23/2020)   No facility-administered encounter medications on file as of 10/23/2020.    Allergies (verified) Antihistamines, diphenhydramine-type   History: Past Medical History:  Diagnosis Date  . ALLERGIC RHINITIS 08/03/2007  . BACK PAIN 09/18/2009  . Basal cell carcinoma 08/21/2004   right wing nose ([facility])  . BCC (basal cell carcinoma of skin) 06/07/2001   mid upper back  . BCC (basal cell  carcinoma) 03/08/2013   right forehead (curet & carter)  . 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 CAROTID BRUIT, LEFT 08/07/2008  . 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 Foot drop    rt foot  . GERD 02/18/2007  . HAMMER TOE 08/03/2007  . 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 KNEE PAIN, LEFT, CHRONIC 08/07/2008  . 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 Rash, skin    torso  . 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 SCC (squamous cell carcinoma) 11/24/2016   left upper arm (curetx3, 39fu)  . SCC (squamous cell carcinoma) 04/20/2017   mid upper forehead  (tx after bx)  . SCC (squamous cell carcinoma) 05/11/2019   left scapha  . SCC (squamous cell carcinoma) 08/17/2019   mid back  . Skin cancer   . Sleeping difficulty   . Squamous cell carcinoma of skin 07/31/2003   right brow (curetx3, 56fu)   Past Surgical History:  Procedure Laterality Date  . CATARACT EXTRACTION W/ INTRAOCULAR LENS  IMPLANT, BILATERAL Bilateral   . KNEE ARTHROSCOPY     left  . 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 SHOULDER OPEN ROTATOR CUFF REPAIR  07/06/05   right  . TONSILLECTOMY AND ADENOIDECTOMY    . TRANSURETHRAL RESECTION OF PROSTATE     Family History  Problem Relation Age of Onset  . Pulmonary fibrosis Mother   . Uterine cancer Mother   . Prostate cancer Father   . Heart disease Brother   . Heart attack Other   . Aneurysm Other        aortic  . Coronary artery disease Sister    Social History   Socioeconomic History  . Marital status: Married    Spouse name: Not on file  . Number of children: 2  . Years of education: Not on file  . Highest education level: Not on file  Occupational History  . Occupation: retired    Comment: Pharmacist, hospital, coaching  Tobacco Use  . Smoking status: Never Smoker  . Smokeless tobacco: Never Used  Vaping Use  . Vaping Use: Never used  Substance and Sexual Activity  . Alcohol use: Yes    Alcohol/week: 0.0 standard drinks    Comment: rarely   . Drug use: No  . Sexual activity: Not on file  Other Topics Concern  . Not on file  Social History Narrative   Married.  Very active.     Social Determinants of Health   Financial Resource  Strain: Low Risk   . Difficulty of Paying Living Expenses: Not hard at all  Food Insecurity: No Food Insecurity  . Worried About Charity fundraiser in the Last Year: Never true  . Ran Out of Food in the Last Year: Never true  Transportation Needs: No Transportation Needs  . Lack of Transportation (Medical): No  . Lack of Transportation (Non-Medical): No  Physical Activity: Sufficiently Active  . Days of Exercise per Week: 5 days  . Minutes of Exercise per Session: 40 min  Stress: No Stress Concern Present  . Feeling of Stress : Not at all  Social Connections: Moderately Integrated  . Frequency of Communication with Friends and Family: More than three times a week  . Frequency  of Social Gatherings with Friends and Family: More than three times a week  . Attends Religious Services: More than 4 times per year  . Active Member of Clubs or Organizations: No  . Attends Archivist Meetings: Never  . Marital Status: Married    Tobacco Counseling Counseling given: Not Answered   Clinical Intake:  Pre-visit preparation completed: Yes  Pain : No/denies pain     BMI - recorded: 23.76 Nutritional Status: BMI of 19-24  Normal Nutritional Risks: Nausea/ vomitting/ diarrhea (loose stool for a couple days) Diabetes: No  How often do you need to have someone help you when you read instructions, pamphlets, or other written materials from your doctor or pharmacy?: 1 - Never  Diabetic?No  Interpreter Needed?: No  Information entered by :: Charlott Rakes, LPN   Activities of Daily Living In your present state of health, do you have any difficulty performing the following activities: 10/23/2020  Hearing? Y  Comment mild loss  Vision? N  Difficulty concentrating or making decisions? Y  Comment memory at times  Walking or climbing stairs? Y  Comment take time  Dressing or bathing? N  Doing errands, shopping? N  Preparing Food and eating ? N  Using the Toilet? N  In the  past six months, have you accidently leaked urine? Y  Comment frequency at times  Do you have problems with loss of bowel control? N  Managing your Medications? N  Managing your Finances? N  Housekeeping or managing your Housekeeping? N  Some recent data might be hidden    Patient Care Team: Isaac Bliss, Rayford Halsted, MD as PCP - General (Internal Medicine) Lavonna Monarch, MD as Consulting Physician (Dermatology)  Indicate any recent Medical Services you may have received from other than [facility] providers in the past year (date may be approximate).     Assessment:   This is a routine wellness examination for [place].  Hearing/Vision screen  Hearing Screening   125Hz  250Hz  500Hz  1000Hz  2000Hz  3000Hz  4000Hz  6000Hz  8000Hz   Right ear:           Left ear:           Comments: Pt has mild loss   Vision Screening Comments: Pt follows up with Dr Alanda Slim for annual eye exams   Dietary issues and exercise activities discussed: Current Exercise Habits: Home exercise routine, Type of exercise: Other - see comments (swimming), Time (Minutes): 45, Frequency (Times/Week): 5, Weekly Exercise (Minutes/Week): 225  Goals    . patient     Keep swimming and staying active     . Patient Stated     None at this time       Depression Screen PHQ 2/9 Scores 10/23/2020 08/29/2020 03/30/2018 03/01/2018 12/04/2017 04/01/2016 10/23/2014  PHQ - 2 Score 1 0 0 0 0 0 0  PHQ- 9 Score - 1 - - - - -    Fall Risk Fall Risk  10/23/2020 08/29/2020 03/30/2018 03/01/2018 12/04/2017  Falls in the past year? 0 0 No No No  Number falls in past yr: 0 0 - - -  Injury with Fall? 0 0 - - -  Risk for fall due to : Impaired balance/gait;Impaired vision - - - -  Follow up Falls prevention discussed - - - -    FALL RISK PREVENTION PERTAINING TO THE HOME:  Any stairs in or around the home? Yes  If so, are there any without handrails? No  Home free of loose throw rugs in  walkways, pet beds, electrical cords, etc? Yes  Adequate  lighting in your home to reduce risk of falls? Yes   ASSISTIVE DEVICES UTILIZED TO PREVENT FALLS:  Life alert? No  Use of a cane, walker or w/c? Yes  Grab bars in the bathroom? Yes  Shower chair or bench in shower? No  Elevated toilet seat or a handicapped toilet? No   TIMED UP AND GO:  Was the test performed? No .     Cognitive Function:   Montreal Cognitive Assessment  10/14/2015  Visuospatial/ Executive (0/5) 2  Naming (0/3) 3  Attention: Read list of digits (0/2) 2  Attention: Read list of letters (0/1) 1  Attention: Serial 7 subtraction starting at 100 (0/3) 3  Language: Repeat phrase (0/2) 2  Language : Fluency (0/1) 1  Abstraction (0/2) 2  Delayed Recall (0/5) 0  Orientation (0/6) 5  Total 21  Adjusted Score (based on education) 21   6CIT Screen 10/23/2020  What Year? 4 points  What month? 0 points  Count back from 20 0 points  Months in reverse 0 points  Repeat phrase 10 points    Immunizations Immunization History  Administered Date(s) Administered  . H1N1 08/07/2008  . HiB (PRP-OMP) 03/01/2015  . Influenza Whole 07/06/2005, 04/06/2007, 04/05/2009  . Influenza, High Dose Seasonal PF 04/01/2016, 03/23/2017, 03/15/2019, 03/26/2020  . Influenza,inj,Quad PF,6+ Mos 04/03/2014, 08/16/2015, 03/30/2018  . Influenza-Unspecified 04/05/2013  . PFIZER(Purple Top)SARS-COV-2 Vaccination 08/12/2019, 09/06/2019, 03/06/2020  . Pneumococcal Conjugate-13 10/17/2013  . Pneumococcal Polysaccharide-23 04/05/2006, 04/05/2008, 05/26/2016  . Td 07/07/2003  . Tdap 03/23/2017  . Tetanus 10/17/2013  . Zoster 08/22/2010  . Zoster Recombinat (Shingrix) 05/10/2018, 07/12/2018    TDAP status: Up to date  Flu Vaccine status: Up to date  Pneumococcal vaccine status: Up to date  Covid-19 vaccine status: Completed vaccines  Qualifies for Shingles Vaccine? Yes   Zostavax completed Yes   Shingrix Completed?: Yes  Screening Tests Health Maintenance  Topic Date Due  .  Hepatitis C Screening  Never done  . COVID-19 Vaccine (4 - Booster for Pfizer series) 09/03/2020  . INFLUENZA VACCINE  02/03/2021  . TETANUS/TDAP  03/24/2027  . PNA vac Low Risk Adult  Completed  . HPV VACCINES  Aged Out    Health Maintenance  Health Maintenance Due  Topic Date Due  . Hepatitis C Screening  Never done  . COVID-19 Vaccine (4 - Booster for Pfizer series) 09/03/2020    Colorectal cancer screening: No longer required.     Additional Screening:  Hepatitis C Screening: does qualify  Vision Screening: Recommended annual ophthalmology exams for Perry detection of glaucoma and other disorders of the eye. Is the patient up to date with their annual eye exam?  Yes  Who is the provider or what is the name of the office in which the patient attends annual eye exams? Dr Alanda Slim  If pt is not established with a provider, would they like to be referred to a provider to establish care? No .   Dental Screening: Recommended annual dental exams for proper oral hygiene  Community Resource Referral / Chronic Care Management: CRR required this visit?  No   CCM required this visit?  No      Plan:     I have personally reviewed and noted the following in the patient's chart:   . Medical and social history . Use of alcohol, tobacco or illicit drugs  . Current medications and supplements . Functional ability and status . Nutritional  status . Physical activity . Advanced directives . List of other physicians . Hospitalizations, surgeries, and ER visits in previous 12 months . Vitals . Screenings to include cognitive, depression, and falls . Referrals and appointments  In addition, I have reviewed and discussed with patient certain preventive protocols, quality metrics, and best practice recommendations. A written personalized care plan for preventive services as well as general preventive health recommendations were provided to patient.     Willette Brace,  LPN   [id number]   Nurse Notes: None

## 2020-10-28 ENCOUNTER — Other Ambulatory Visit: Payer: Self-pay | Admitting: Internal Medicine

## 2020-10-28 DIAGNOSIS — M21371 Foot drop, right foot: Secondary | ICD-10-CM

## 2020-10-28 DIAGNOSIS — F411 Generalized anxiety disorder: Secondary | ICD-10-CM

## 2020-10-31 ENCOUNTER — Other Ambulatory Visit: Payer: Self-pay

## 2020-11-01 ENCOUNTER — Ambulatory Visit: Payer: Self-pay

## 2020-11-01 ENCOUNTER — Ambulatory Visit (INDEPENDENT_AMBULATORY_CARE_PROVIDER_SITE_OTHER): Payer: Medicare Other

## 2020-11-01 DIAGNOSIS — E538 Deficiency of other specified B group vitamins: Secondary | ICD-10-CM | POA: Diagnosis not present

## 2020-11-01 MED ORDER — CYANOCOBALAMIN 1000 MCG/ML IJ SOLN
1000.0000 ug | Freq: Once | INTRAMUSCULAR | Status: AC
  Administered 2020-11-01: 1000 ug via INTRAMUSCULAR

## 2020-11-01 NOTE — Progress Notes (Signed)
Per orders of Dr. Jordan, injection of B12 given by Malayia Spizzirri E Leira Regino. Patient tolerated injection well.  

## 2020-11-20 ENCOUNTER — Other Ambulatory Visit: Payer: Self-pay | Admitting: Nurse Practitioner

## 2020-11-20 DIAGNOSIS — C8580 Other specified types of non-Hodgkin lymphoma, unspecified site: Secondary | ICD-10-CM

## 2020-11-20 MED ORDER — HYDROCODONE-ACETAMINOPHEN 5-325 MG PO TABS: 1.0000 | ORAL_TABLET | Freq: Two times a day (BID) | ORAL | 0 refills | Status: DC | PRN

## 2020-12-03 ENCOUNTER — Telehealth: Payer: Self-pay | Admitting: *Deleted

## 2020-12-03 ENCOUNTER — Ambulatory Visit (INDEPENDENT_AMBULATORY_CARE_PROVIDER_SITE_OTHER): Payer: Medicare Other | Admitting: *Deleted

## 2020-12-03 ENCOUNTER — Other Ambulatory Visit: Payer: Self-pay

## 2020-12-03 DIAGNOSIS — E538 Deficiency of other specified B group vitamins: Secondary | ICD-10-CM

## 2020-12-03 MED ORDER — CYANOCOBALAMIN 1000 MCG/ML IJ SOLN
1000.0000 ug | Freq: Once | INTRAMUSCULAR | Status: AC
  Administered 2020-12-03: 1000 ug via INTRAMUSCULAR

## 2020-12-03 NOTE — Telephone Encounter (Signed)
Patient would like to know when should he have his B12 levels checked?

## 2020-12-03 NOTE — Progress Notes (Signed)
Per orders of Dr. Hernandez, injection of Cyanocobalamin 1000mcg given by Koree Schopf A. Patient tolerated injection well. 

## 2020-12-03 NOTE — Telephone Encounter (Signed)
Patient is aware 

## 2020-12-17 ENCOUNTER — Ambulatory Visit: Payer: Medicare Other | Admitting: Dermatology

## 2020-12-17 ENCOUNTER — Other Ambulatory Visit: Payer: Self-pay

## 2020-12-17 DIAGNOSIS — L821 Other seborrheic keratosis: Secondary | ICD-10-CM

## 2020-12-17 DIAGNOSIS — L570 Actinic keratosis: Secondary | ICD-10-CM

## 2020-12-17 DIAGNOSIS — L820 Inflamed seborrheic keratosis: Secondary | ICD-10-CM

## 2020-12-17 DIAGNOSIS — Z1283 Encounter for screening for malignant neoplasm of skin: Secondary | ICD-10-CM

## 2020-12-17 NOTE — Progress Notes (Signed)
   Follow-Up Visit   Subjective  Perry Nielsen. is a 80 y.o. male who presents for the following: Skin Problem (Patient here to have a few spots on his scalp. The spot he wanted looked at is now gone per patient. ).  General skin examination, several new crust on scalp Location:  Duration:  Quality:  Associated Signs/Symptoms: Modifying Factors:  Severity:  Timing: Context:   Objective  Well appearing patient in no apparent distress; mood and affect are within normal limits. Mid Frontal Scalp (3), Scalp Multiple pink hornlike 2 to 3 mm crusts  Chest - Medial (Center) Mid chest x1 inflamed 6 mm pink textured papule  Mid Back Dozens of 4 to 15 mm brown flattopped textured papules on torso  Left Inguinal Area No atypical moles found today on waist up examination    All skin waist up examined.   Assessment & Plan    AK (actinic keratosis) (4) Mid Frontal Scalp (3); Scalp  Destruction of lesion - Mid Frontal Scalp, Scalp Complexity: simple   Destruction method: cryotherapy   Informed consent: discussed and consent obtained   Timeout:  patient name, date of birth, surgical site, and procedure verified Lesion destroyed using liquid nitrogen: Yes   Cryotherapy cycles:  3 Outcome: patient tolerated procedure well with no complications   Post-procedure details: wound care instructions given    Inflamed seborrheic keratosis Chest - Medial (Center)  Destruction of lesion - Chest - Medial (Center) Complexity: simple   Destruction method: cryotherapy   Cryotherapy cycles:  3 Hemostasis achieved with:  ferric subsulfate  Seborrheic keratosis Mid Back  Leave if stable  Screening exam for skin cancer Left Inguinal Area  Annual skin examination      I, Lavonna Monarch, MD, have reviewed all documentation for this visit.  The documentation on 12/21/20 for the exam, diagnosis, procedures, and orders are all accurate and complete.   Follow-Up  Visit   Subjective  Perry Nielsen. is a 80 y.o. male who presents for the following: Skin Problem (Patient here to have a few spots on his scalp. The spot he wanted looked at is now gone per patient. ).  General skin examination, several new growths on scalp Location:  Duration:  Quality:  Associated Signs/Symptoms: Modifying Factors:  Severity:  Timing: Context:   Objective  Well appearing patient in no apparent distress; mood and affect are within normal limits. Chest - Medial (Center) Mid chest x1 inflamed   Mid Back Back and chest many   Left Inguinal Area No atypical moles found today     All skin waist up examined.   Assessment & Plan    AK (actinic keratosis) (4) Mid Frontal Scalp (3); Scalp  Destruction of lesion - Mid Frontal Scalp, Scalp Complexity: simple   Destruction method: cryotherapy   Informed consent: discussed and consent obtained   Timeout:  patient name, date of birth, surgical site, and procedure verified Lesion destroyed using liquid nitrogen: Yes   Cryotherapy cycles:  3 Outcome: patient tolerated procedure well with no complications   Post-procedure details: wound care instructions given    Inflamed seborrheic keratosis Chest - Medial (Center)  Seborrheic keratosis Mid Back  Screening exam for skin cancer Left Inguinal Area   LN2 x5 scalp, x3 forehead.   I, Lavonna Monarch, MD, have reviewed all documentation for this visit.  The documentation on 12/17/20 for the exam, diagnosis, procedures, and orders are all accurate and complete.

## 2020-12-21 ENCOUNTER — Encounter: Payer: Self-pay | Admitting: Dermatology

## 2021-01-02 ENCOUNTER — Other Ambulatory Visit: Payer: Self-pay

## 2021-01-03 ENCOUNTER — Ambulatory Visit (INDEPENDENT_AMBULATORY_CARE_PROVIDER_SITE_OTHER): Payer: Medicare Other

## 2021-01-03 DIAGNOSIS — E538 Deficiency of other specified B group vitamins: Secondary | ICD-10-CM | POA: Diagnosis not present

## 2021-01-03 MED ORDER — CYANOCOBALAMIN 1000 MCG/ML IJ SOLN
1000.0000 ug | Freq: Once | INTRAMUSCULAR | Status: AC
  Administered 2021-01-03: 1000 ug via INTRAMUSCULAR

## 2021-01-03 NOTE — Progress Notes (Signed)
Per orders of Dr. Hernandez , injection of Cyanocobalamin 1,000 mcg/mL given by Cahlil Sattar N Shakeira Rhee. °Patient tolerated injection well. ° °

## 2021-01-22 ENCOUNTER — Other Ambulatory Visit: Payer: Self-pay | Admitting: Oncology

## 2021-01-22 DIAGNOSIS — C8580 Other specified types of non-Hodgkin lymphoma, unspecified site: Secondary | ICD-10-CM

## 2021-01-22 MED ORDER — HYDROCODONE-ACETAMINOPHEN 5-325 MG PO TABS
1.0000 | ORAL_TABLET | Freq: Two times a day (BID) | ORAL | 0 refills | Status: DC | PRN
Start: 2021-01-22 — End: 2021-03-20

## 2021-01-23 ENCOUNTER — Other Ambulatory Visit: Payer: Self-pay | Admitting: Internal Medicine

## 2021-01-23 DIAGNOSIS — M21371 Foot drop, right foot: Secondary | ICD-10-CM

## 2021-01-23 DIAGNOSIS — F411 Generalized anxiety disorder: Secondary | ICD-10-CM

## 2021-02-03 ENCOUNTER — Other Ambulatory Visit: Payer: Self-pay

## 2021-02-03 ENCOUNTER — Ambulatory Visit (INDEPENDENT_AMBULATORY_CARE_PROVIDER_SITE_OTHER): Payer: Medicare Other

## 2021-02-03 DIAGNOSIS — E538 Deficiency of other specified B group vitamins: Secondary | ICD-10-CM

## 2021-02-03 MED ORDER — CYANOCOBALAMIN 1000 MCG/ML IJ SOLN
1000.0000 ug | Freq: Once | INTRAMUSCULAR | Status: AC
  Administered 2021-02-03: 1000 ug via INTRAMUSCULAR

## 2021-02-03 NOTE — Progress Notes (Signed)
Per orders from Dr Jerilee Hoh, pt was given B12 injection by Madaline Guthrie, pt tolerated well.

## 2021-02-23 ENCOUNTER — Other Ambulatory Visit: Payer: Self-pay | Admitting: Internal Medicine

## 2021-02-23 DIAGNOSIS — E039 Hypothyroidism, unspecified: Secondary | ICD-10-CM

## 2021-02-25 ENCOUNTER — Inpatient Hospital Stay: Payer: Medicare Other | Attending: Oncology | Admitting: Oncology

## 2021-02-25 ENCOUNTER — Other Ambulatory Visit: Payer: Self-pay

## 2021-02-25 VITALS — BP 129/83 | HR 67 | Temp 98.0°F | Resp 18 | Ht 71.0 in | Wt 167.8 lb

## 2021-02-25 DIAGNOSIS — R918 Other nonspecific abnormal finding of lung field: Secondary | ICD-10-CM | POA: Insufficient documentation

## 2021-02-25 DIAGNOSIS — I4891 Unspecified atrial fibrillation: Secondary | ICD-10-CM | POA: Diagnosis not present

## 2021-02-25 DIAGNOSIS — M79671 Pain in right foot: Secondary | ICD-10-CM | POA: Diagnosis not present

## 2021-02-25 DIAGNOSIS — Z9081 Acquired absence of spleen: Secondary | ICD-10-CM | POA: Diagnosis not present

## 2021-02-25 DIAGNOSIS — M79606 Pain in leg, unspecified: Secondary | ICD-10-CM | POA: Diagnosis not present

## 2021-02-25 DIAGNOSIS — Z86718 Personal history of other venous thrombosis and embolism: Secondary | ICD-10-CM | POA: Insufficient documentation

## 2021-02-25 DIAGNOSIS — C8580 Other specified types of non-Hodgkin lymphoma, unspecified site: Secondary | ICD-10-CM

## 2021-02-25 NOTE — Progress Notes (Signed)
[facility] OFFICE PROGRESS NOTE   Diagnosis: Non-Hodgkin's lymphoma  INTERVAL HISTORY:   Perry Nielsen returns as scheduled.  He continues to have pain in the right foot and leg.  He takes hydrocodone in the evening.  He swims 4 to 5 days/week.  No other complaint.  Objective:  Vital signs in last 24 hours:  Blood pressure 129/83, pulse 67, temperature 98 F (36.7 C), temperature source Oral, resp. rate 18, height '5\' 11"'  (1.803 m), weight 167 lb 12.8 oz (76.1 kg), SpO2 100 %.   Lymphatics: No cervical, supraclavicular, or inguinal nodes.  "Shotty "bilateral axillary nodes. Resp: Lungs clear bilaterally Cardio: Regular rate and rhythm GI: No hepatosplenomegaly Vascular: No leg edema Neuro: 3/5 strength with dorsi flexion of the right foot    Lab Results:  Lab Results  Component Value Date   WBC 8.9 09/03/2020   HGB 14.6 09/03/2020   HCT 44.1 09/03/2020   MCV 96.9 09/03/2020   PLT 232.0 09/03/2020   NEUTROABS 3.4 09/03/2020    CMP  Lab Results  Component Value Date   NA 139 09/03/2020   K 4.0 09/03/2020   CL 102 09/03/2020   CO2 29 09/03/2020   GLUCOSE 87 09/03/2020   BUN 15 09/03/2020   CREATININE 0.95 09/03/2020   CALCIUM 9.2 09/03/2020   PROT 6.3 09/03/2020   ALBUMIN 4.0 09/03/2020   AST 17 09/03/2020   ALT 16 09/03/2020   ALKPHOS 63 09/03/2020   BILITOT 0.8 09/03/2020   GFRNONAA 44 (L) 12/05/2017   GFRAA 51 (L) 12/05/2017    Lab Results  Component Value Date   CEA 2.0 07/08/2015     Medications: I have reviewed the patient's current medications.   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 History of thrombocytopenia predating chemotherapy 3. T7 compression fracture-likely pathologic 4. 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 Right foot pain-likely secondary to nerve compression from the sacral mass, improved 6. Diffuse erythematous rash, appearance consistent with a "drug" rash-resolved 7. History of low-grade fever likely secondary to atelectasis/postop fever versus tumor fever 8. Hepatitis B core antibody positive 10. Admission 04/18/2015 through 04/23/2015 presenting with fever; amylase and lipase elevated.   Abdominal CT showed inflammation in the left upper quadrant and associated loculated fluid collections status post drainage procedure, fluid with high levels of amylase and lipase; cultures negative; question pancreatitis.   04/26/2015 lipase mildly elevated, amylase normal.   CT abdomen/pelvis 05/14/2015 with a persistent 6.5 x 3.8 cm posterior pancreatic tail fluid collection similar in size to the 04/18/2015 CT study. Additional smaller fluid collections in the far lateral left upper quadrant; extensive patchy groundglass opacity at the lung bases, significantly increased; stable expansile lytic lesion in the upper right sacrum. 11. 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 Severe neutropenia 05/13/2015. Likely delayed effect of Rituxan. Resolved 13. Chest x-ray 05/15/2015 with bilateral mid to lower lung interstitial thickening with some hazy  lower lung zone groundglass opacity. Partially improved on the chest x-ray  05/24/2015 14. New-onset rapid atrial fibrillation 05/29/2015 15. Admission with acute onset upper abdomen/low anterior chest pain 07/07/2015-most likely related to Neulasta, spontaneously resolved 16.  Altered mental status-  Potentially related to persistent delirium following a prolonged critical illness , depression, dementia, or psychosis. Brain MRI 10/22/2015 with no acute or metastatic intracranial abnormality. Stable appearance of the brain since 2016. Mental status improved 10/24/2015. He is followed by neurology        Disposition: Perry Nielsen is in remission from non-Hodgkin's lymphoma.  He has a good prognosis for long-term disease-free survival.  He would like to continue follow-up at the Cancer center.  He will return for an office visit in 8 months.  He will continue hydrocodone as needed for pain in the right leg and foot.  I cautioned him against taking hydrocodone while driving.  Betsy Coder, MD  02/25/2021  4:07 PM

## 2021-02-26 ENCOUNTER — Other Ambulatory Visit: Payer: Self-pay | Admitting: Internal Medicine

## 2021-02-26 DIAGNOSIS — F411 Generalized anxiety disorder: Secondary | ICD-10-CM

## 2021-02-26 DIAGNOSIS — M21371 Foot drop, right foot: Secondary | ICD-10-CM

## 2021-03-05 ENCOUNTER — Other Ambulatory Visit: Payer: Self-pay

## 2021-03-06 ENCOUNTER — Ambulatory Visit (INDEPENDENT_AMBULATORY_CARE_PROVIDER_SITE_OTHER): Payer: Medicare Other

## 2021-03-06 DIAGNOSIS — E538 Deficiency of other specified B group vitamins: Secondary | ICD-10-CM | POA: Diagnosis not present

## 2021-03-06 DIAGNOSIS — Z0000 Encounter for general adult medical examination without abnormal findings: Secondary | ICD-10-CM

## 2021-03-06 MED ORDER — CYANOCOBALAMIN 1000 MCG/ML IJ SOLN
1000.0000 ug | Freq: Once | INTRAMUSCULAR | Status: AC
  Administered 2021-03-06: 1000 ug via INTRAMUSCULAR

## 2021-03-06 NOTE — Progress Notes (Signed)
Per orders of Dr. Hernandez, injection of Cyanocobalamin 1000 mcg given by Josimar Corning L Hobie Kohles. Patient tolerated injection well.  

## 2021-03-20 ENCOUNTER — Other Ambulatory Visit: Payer: Self-pay | Admitting: Nurse Practitioner

## 2021-03-20 DIAGNOSIS — C8580 Other specified types of non-Hodgkin lymphoma, unspecified site: Secondary | ICD-10-CM

## 2021-03-20 MED ORDER — HYDROCODONE-ACETAMINOPHEN 5-325 MG PO TABS: 1.0000 | ORAL_TABLET | Freq: Two times a day (BID) | ORAL | 0 refills | Status: DC | PRN

## 2021-04-07 ENCOUNTER — Ambulatory Visit (INDEPENDENT_AMBULATORY_CARE_PROVIDER_SITE_OTHER): Payer: Medicare Other

## 2021-04-07 ENCOUNTER — Ambulatory Visit: Payer: Medicare Other

## 2021-04-07 ENCOUNTER — Other Ambulatory Visit: Payer: Self-pay

## 2021-04-07 DIAGNOSIS — E538 Deficiency of other specified B group vitamins: Secondary | ICD-10-CM | POA: Diagnosis not present

## 2021-04-07 MED ORDER — CYANOCOBALAMIN 1000 MCG/ML IJ SOLN
1000.0000 ug | Freq: Once | INTRAMUSCULAR | Status: AC
  Administered 2021-04-07: 1000 ug via INTRAMUSCULAR

## 2021-04-07 NOTE — Progress Notes (Signed)
Per orders of Isaac Bliss, Rayford Halsted, MD, injection of B12 given in right  deltoid by Dennette Faulconer D Billee Balcerzak. Patient tolerated injection well.  Lab Results  Component Value Date   VITAMINB12 122 (L) 09/03/2020

## 2021-04-23 ENCOUNTER — Other Ambulatory Visit: Payer: Self-pay | Admitting: Internal Medicine

## 2021-04-23 DIAGNOSIS — M21371 Foot drop, right foot: Secondary | ICD-10-CM

## 2021-04-23 DIAGNOSIS — F411 Generalized anxiety disorder: Secondary | ICD-10-CM

## 2021-05-20 ENCOUNTER — Encounter: Payer: Self-pay | Admitting: *Deleted

## 2021-05-20 ENCOUNTER — Other Ambulatory Visit: Payer: Self-pay | Admitting: Nurse Practitioner

## 2021-05-20 DIAGNOSIS — C8580 Other specified types of non-Hodgkin lymphoma, unspecified site: Secondary | ICD-10-CM

## 2021-05-20 MED ORDER — HYDROCODONE-ACETAMINOPHEN 5-325 MG PO TABS
1.0000 | ORAL_TABLET | Freq: Two times a day (BID) | ORAL | 0 refills | Status: DC | PRN
Start: 2021-05-20 — End: 2021-07-16

## 2021-05-20 NOTE — Progress Notes (Unsigned)
Patient's wife called requesting a refill for hydroocodone. Refill request given to Ned Card NP.

## 2021-05-25 ENCOUNTER — Other Ambulatory Visit: Payer: Self-pay | Admitting: Internal Medicine

## 2021-05-25 DIAGNOSIS — F411 Generalized anxiety disorder: Secondary | ICD-10-CM

## 2021-05-25 DIAGNOSIS — M21371 Foot drop, right foot: Secondary | ICD-10-CM

## 2021-06-15 DIAGNOSIS — U071 COVID-19: Secondary | ICD-10-CM | POA: Diagnosis not present

## 2021-06-15 DIAGNOSIS — R6889 Other general symptoms and signs: Secondary | ICD-10-CM | POA: Diagnosis not present

## 2021-06-23 ENCOUNTER — Other Ambulatory Visit: Payer: Self-pay | Admitting: Adult Health

## 2021-06-23 DIAGNOSIS — M21371 Foot drop, right foot: Secondary | ICD-10-CM

## 2021-06-23 DIAGNOSIS — F411 Generalized anxiety disorder: Secondary | ICD-10-CM

## 2021-07-15 ENCOUNTER — Telehealth: Payer: Self-pay | Admitting: Internal Medicine

## 2021-07-15 NOTE — Telephone Encounter (Signed)
Patient's wife called because insurance has stated that they will cover the installation  of an  additional railing in their home for their stairs, but will need authorization from a doctor. Wife wants to know if Dr.Hernandez will put that authorization in for him.    Good callback number for [facility] [phone]     Good callback for patients wife [phone]   Please advise

## 2021-07-16 ENCOUNTER — Other Ambulatory Visit: Payer: Self-pay | Admitting: Nurse Practitioner

## 2021-07-16 DIAGNOSIS — C8580 Other specified types of non-Hodgkin lymphoma, unspecified site: Secondary | ICD-10-CM

## 2021-07-16 MED ORDER — HYDROCODONE-ACETAMINOPHEN 5-325 MG PO TABS
1.0000 | ORAL_TABLET | Freq: Two times a day (BID) | ORAL | 0 refills | Status: DC | PRN
Start: 2021-07-16 — End: 2021-08-27

## 2021-07-17 NOTE — Telephone Encounter (Signed)
Form is filled out & in your folder to be signed.

## 2021-07-22 ENCOUNTER — Telehealth (HOSPITAL_COMMUNITY): Payer: Self-pay | Admitting: Emergency Medicine

## 2021-07-22 NOTE — Telephone Encounter (Signed)
Spoke with pts wife about hydrocodone needing prior authorization.  They have paid for this script out of pocket.  When authorization is completed, it states that pt eligibility can not be found.  Called [employer] and they cant find the pt with the new member ID [id number].  Will re-try in a few days to send authorization back through.  Wife verbalized understanding.

## 2021-07-24 ENCOUNTER — Telehealth: Payer: Self-pay

## 2021-07-24 NOTE — Telephone Encounter (Signed)
[employer] called and left a voicemail stating the patient have an active policy start on 49/49/4473.

## 2021-07-27 ENCOUNTER — Other Ambulatory Visit: Payer: Self-pay | Admitting: Internal Medicine

## 2021-07-27 DIAGNOSIS — M21371 Foot drop, right foot: Secondary | ICD-10-CM

## 2021-07-27 DIAGNOSIS — F411 Generalized anxiety disorder: Secondary | ICD-10-CM

## 2021-07-28 NOTE — Telephone Encounter (Signed)
Last refill per controlled substance database: 06/27/22 Last OV: 08/29/20 Next OV: 09/01/21

## 2021-07-30 NOTE — Telephone Encounter (Signed)
On 07/24/21 received fax notification from [facility] that "no prior authorization is required for the procedure & dx codes listed on the fax. The reference number is [id number]. Please call customer service at [phone] to verify members coverage & eligibility"

## 2021-08-12 ENCOUNTER — Encounter: Payer: Self-pay | Admitting: Physician Assistant

## 2021-08-12 ENCOUNTER — Other Ambulatory Visit: Payer: Self-pay

## 2021-08-12 ENCOUNTER — Ambulatory Visit: Payer: Medicare Other | Admitting: Physician Assistant

## 2021-08-12 DIAGNOSIS — B079 Viral wart, unspecified: Secondary | ICD-10-CM

## 2021-08-12 DIAGNOSIS — L820 Inflamed seborrheic keratosis: Secondary | ICD-10-CM

## 2021-08-12 DIAGNOSIS — Z85828 Personal history of other malignant neoplasm of skin: Secondary | ICD-10-CM | POA: Diagnosis not present

## 2021-08-12 DIAGNOSIS — D485 Neoplasm of uncertain behavior of skin: Secondary | ICD-10-CM

## 2021-08-12 NOTE — Patient Instructions (Signed)

## 2021-08-25 ENCOUNTER — Other Ambulatory Visit: Payer: Self-pay | Admitting: Internal Medicine

## 2021-08-25 DIAGNOSIS — E039 Hypothyroidism, unspecified: Secondary | ICD-10-CM

## 2021-08-27 ENCOUNTER — Other Ambulatory Visit: Payer: Self-pay | Admitting: Nurse Practitioner

## 2021-08-27 DIAGNOSIS — C8580 Other specified types of non-Hodgkin lymphoma, unspecified site: Secondary | ICD-10-CM

## 2021-08-27 MED ORDER — HYDROCODONE-ACETAMINOPHEN 5-325 MG PO TABS: 1.0000 | ORAL_TABLET | Freq: Two times a day (BID) | ORAL | 0 refills | Status: DC | PRN

## 2021-08-31 ENCOUNTER — Encounter: Payer: Self-pay | Admitting: Physician Assistant

## 2021-08-31 NOTE — Progress Notes (Signed)
° °  Follow-Up Visit   Subjective  Perry Nielsen. is a 81 y.o. male who presents for the following: Skin Problem (Patient here today with his wife for lesion on his forehead and scalp x months no bleeding, crusty, no pain. Per patient the lesion on his forehead is growing. Personal history of non mole skin cancer. No family history of atypical moles, melanoma or non mole skin cancer. ).   The following portions of the chart were reviewed this encounter and updated as appropriate:  Tobacco   Allergies   Meds   Problems   Med Hx   Surg Hx   Fam Hx       Objective  Well appearing patient in no apparent distress; mood and affect are within normal limits.  A focused examination was performed including face. Relevant physical exam findings are noted in the Assessment and Plan.  Mid Frontal Scalp Thick, dark crust  Mid Forehead Thick crusted plaque   Assessment & Plan  Neoplasm of uncertain behavior of skin (2) Mid Frontal Scalp  Skin / nail biopsy Type of biopsy: tangential   Informed consent: discussed and consent obtained   Timeout: patient name, date of birth, surgical site, and procedure verified   Procedure prep:  Patient was prepped and draped in usual sterile fashion (Non sterile) Prep type:  Chlorhexidine Anesthesia: the lesion was anesthetized in a standard fashion   Anesthetic:  1% lidocaine w/ epinephrine 1-100,000 local infiltration Instrument used: flexible razor blade   Outcome: patient tolerated procedure well   Post-procedure details: wound care instructions given    Specimen 1 - Surgical pathology Differential Diagnosis: r/o sk  Check Margins: No  Mid Forehead  Skin / nail biopsy Type of biopsy: tangential   Informed consent: discussed and consent obtained   Timeout: patient name, date of birth, surgical site, and procedure verified   Procedure prep:  Patient was prepped and draped in usual sterile fashion (Non sterile) Prep type:   Chlorhexidine Anesthesia: the lesion was anesthetized in a standard fashion   Anesthetic:  1% lidocaine w/ epinephrine 1-100,000 local infiltration Instrument used: flexible razor blade   Outcome: patient tolerated procedure well   Post-procedure details: wound care instructions given    Specimen 2 - Surgical pathology Differential Diagnosis: r/o sk  Check Margins: No    I, Taheerah Guldin, PA-C, have reviewed all documentation's for this visit.  The documentation on 08/31/21 for the exam, diagnosis, procedures and orders are all accurate and complete.

## 2021-09-01 ENCOUNTER — Ambulatory Visit (INDEPENDENT_AMBULATORY_CARE_PROVIDER_SITE_OTHER): Payer: BC Managed Care – PPO | Admitting: Internal Medicine

## 2021-09-01 ENCOUNTER — Encounter: Payer: Self-pay | Admitting: Internal Medicine

## 2021-09-01 VITALS — BP 98/78 | HR 94 | Temp 97.6°F | Ht 70.0 in | Wt 165.6 lb

## 2021-09-01 DIAGNOSIS — Z0000 Encounter for general adult medical examination without abnormal findings: Secondary | ICD-10-CM | POA: Diagnosis not present

## 2021-09-01 DIAGNOSIS — M21371 Foot drop, right foot: Secondary | ICD-10-CM

## 2021-09-01 DIAGNOSIS — N529 Male erectile dysfunction, unspecified: Secondary | ICD-10-CM | POA: Diagnosis not present

## 2021-09-01 DIAGNOSIS — E538 Deficiency of other specified B group vitamins: Secondary | ICD-10-CM

## 2021-09-01 DIAGNOSIS — C8580 Other specified types of non-Hodgkin lymphoma, unspecified site: Secondary | ICD-10-CM

## 2021-09-01 DIAGNOSIS — R351 Nocturia: Secondary | ICD-10-CM

## 2021-09-01 DIAGNOSIS — N401 Enlarged prostate with lower urinary tract symptoms: Secondary | ICD-10-CM

## 2021-09-01 DIAGNOSIS — E039 Hypothyroidism, unspecified: Secondary | ICD-10-CM | POA: Diagnosis not present

## 2021-09-01 NOTE — Progress Notes (Signed)
Established Patient Office Visit     This visit occurred during the SARS-CoV-2 public health emergency.  Safety protocols were in place, including screening questions prior to the visit, additional usage of staff PPE, and extensive cleaning of exam room while observing appropriate contact time as indicated for disinfecting solutions.    CC/Reason for Visit: Annual preventive exam and subsequent Medicare wellness visit  HPI: Gen Clagg. is a 81 y.o. male who is coming in today for the above mentioned reasons. 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  He has a history of erectile dysfunction for which he takes vardenafil as needed.  He also takes gabapentin and hydrocodone that are prescribed by his oncologist.  He tells me that he is about to be released from oncology care and is wondering whether I can take over the prescription for hydrocodone and gabapentin.  He continues to swim about 4 days a week.  No acute issues today.  He has routine eye and dental care.  All immunizations are up-to-date.   Past Medical/Surgical History: Past Medical History:  Diagnosis Date   ALLERGIC RHINITIS 08/03/2007   BACK PAIN 09/18/2009   Basal cell carcinoma 08/21/2004   right wing nose ([facility])   BCC (basal cell carcinoma of skin) 06/07/2001   mid upper back   BCC (basal cell carcinoma) 03/08/2013   right forehead (curet & carter)   BPH associated with nocturia 02/18/2007   Qualifier: Diagnosis of  By: Tiney Rouge CMA, [place], LEFT 08/07/2008   ERECTILE DYSFUNCTION, MILD 08/07/2008   Foot drop    rt foot   GERD 02/18/2007   HAMMER TOE 08/03/2007   Hepatitis 1977   no residual problems, type A   KNEE PAIN, LEFT, CHRONIC 08/07/2008   Lymphoma ([place])    right hip   Rash, skin    torso   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   right nose (caulery, 52fu)   SCC (squamous cell carcinoma) 11/24/2016    left upper arm (curetx3, 66fu)   SCC (squamous cell carcinoma) 04/20/2017   mid upper forehead  (tx after bx)   SCC (squamous cell carcinoma) 05/11/2019   left scapha   SCC (squamous cell carcinoma) 08/17/2019   mid back   Skin cancer    Sleeping difficulty    Squamous cell carcinoma of skin 07/31/2003   right brow (curetx3, 65fu)    Past Surgical History:  Procedure Laterality Date   CATARACT EXTRACTION W/ INTRAOCULAR LENS  IMPLANT, BILATERAL Bilateral    KNEE ARTHROSCOPY     left   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   Procedure: LAPAROSCOPIC SPLENECTOMY;  Surgeon: Excell Seltzer, MD;  Location: [facility];  Service: General;  Laterality: N/A;   SHOULDER OPEN ROTATOR CUFF REPAIR  07/06/05   right   TONSILLECTOMY AND ADENOIDECTOMY     TRANSURETHRAL RESECTION OF PROSTATE      Social History:  reports that he has never smoked. He has never used smokeless tobacco. He reports current alcohol use. He reports that he does not use drugs.  Allergies: Allergies  Allergen Reactions   Antihistamines, Diphenhydramine-Type     CAUSES PROSTATE TO SWELL & as a child had hyperactivity when took Benadryl Pt took Pepcid w/ Benadryl PO and did fine per Pt & his wife    Family History:  Family History  Problem Relation Age of Onset  Pulmonary fibrosis Mother    Uterine cancer Mother    Prostate cancer Father    Heart disease Brother    Heart attack Other    Aneurysm Other        aortic   Coronary artery disease Sister      Current Outpatient Medications:    amitriptyline (ELAVIL) 50 MG tablet, Take 50 mg by mouth at bedtime., Disp: , Rfl:    cyanocobalamin (,VITAMIN B-12,) 1000 MCG/ML injection, Inject 27ml in deltoid once weekly for 4 weeks, then inject 1 ml once a month thereafter, Disp: 6 mL, Rfl: 3   HYDROcodone-acetaminophen (NORCO) 5-325 MG tablet, Take 1 tablet by mouth 2 (two) times daily as needed for moderate pain., Disp: 60 tablet, Rfl: 0   levothyroxine (SYNTHROID) 25  MCG tablet, TAKE 1 TABLET(25 MCG) BY MOUTH DAILY BEFORE BREAKFAST, Disp: 90 tablet, Rfl: 0   LORazepam (ATIVAN) 0.5 MG tablet, TAKE 1 TABLET(0.5 MG) BY MOUTH TWICE DAILY AS NEEDED FOR ANXIETY, Disp: 60 tablet, Rfl: 1   oxymetazoline (AFRIN) 0.05 % nasal spray, Place 1 spray into both nostrils 2 (two) times daily as needed for congestion., Disp: , Rfl:    Polyethyl Glycol-Propyl Glycol (SYSTANE OP), Apply 1 drop to eye daily as needed (dryness)., Disp: , Rfl:    Polyethylene Glycol 3350 (MIRALAX PO), Take 17 g by mouth daily as needed., Disp: , Rfl:    SYRINGE-NEEDLE, DISP, 3 ML (BD SAFETYGLIDE SYRINGE/NEEDLE) 25G X 1" 3 ML MISC, Use for B12 injections, Disp: 100 each, Rfl: 11   vardenafil (LEVITRA) 20 MG tablet, Take 1 tablet (20 mg total) by mouth daily as needed for erectile dysfunction., Disp: 20 tablet, Rfl: 2  Review of Systems:  Constitutional: Denies fever, chills, diaphoresis, appetite change and fatigue.  HEENT: Denies photophobia, eye pain, redness, hearing loss, ear pain, congestion, sore throat, rhinorrhea, sneezing, mouth sores, trouble swallowing, neck pain, neck stiffness and tinnitus.   Respiratory: Denies SOB, DOE, cough, chest tightness,  and wheezing.   Cardiovascular: Denies chest pain, palpitations and leg swelling.  Gastrointestinal: Denies nausea, vomiting, abdominal pain, diarrhea, constipation, blood in stool and abdominal distention.  Genitourinary: Denies dysuria, urgency, frequency, hematuria, flank pain and difficulty urinating.  Endocrine: Denies: hot or cold intolerance, sweats, changes in hair or nails, polyuria, polydipsia. Musculoskeletal: Denies myalgias, back pain, joint swelling, arthralgias. Skin: Denies pallor, rash and wound.  Neurological: Denies dizziness, seizures, syncope, weakness, light-headedness, numbness and headaches.  Hematological: Denies adenopathy. Easy bruising, personal or family bleeding history  Psychiatric/Behavioral: Denies suicidal  ideation, mood changes, confusion, nervousness, sleep disturbance and agitation    Physical Exam: Vitals:   09/01/21 1522  BP: 98/78  Pulse: 94  Temp: 97.6 F (36.4 C)  TempSrc: Oral  SpO2: 98%  Weight: 165 lb 9.6 oz (75.1 kg)  Height: 5\' 10"  (1.778 m)    Body mass index is 23.76 kg/m.   Constitutional: NAD, calm, comfortable Eyes: PERRL, lids and conjunctivae normal, wears corrective lenses ENMT: Mucous membranes are moist. Posterior pharynx clear of any exudate or lesions. Normal dentition. Tympanic membrane is pearly white, no erythema or bulging. Neck: normal, supple, no masses, no thyromegaly Respiratory: clear to auscultation bilaterally, no wheezing, no crackles. Normal respiratory effort. No accessory muscle use.  Cardiovascular: Regular rate and rhythm, no murmurs / rubs / gallops. No extremity edema. 2+ pedal pulses. No carotid bruits.  Abdomen: no tenderness, no masses palpated. No hepatosplenomegaly. Bowel sounds positive.  Musculoskeletal: no clubbing / cyanosis. No joint deformity upper  and lower extremities. Good ROM, no contractures. Normal muscle tone.  Skin: no rashes, lesions, ulcers. No induration Neurologic: CN 2-12 grossly intact. Sensation intact, DTR normal. Strength 5/5 in all 4.  Psychiatric: Normal judgment and insight. Alert and oriented x 3. Normal mood.    Subsequent Medicare wellness visit   1. Risk factors, based on past  M,S,F -cardiovascular disease risk factors include age, gender   2.  Physical activities: He swims 4 days a week   3.  Depression/mood: Stable, not depressed   4.  Hearing: Mild decreased hearing but not interested in audiology or ENT referral   5.  ADL's: Mostly independent for all ADLs   6.  Fall risk: Moderate fall risk due to foot drop   7.  Home safety: No problems identified   8.  Height weight, and visual acuity: height and weight as above, vision:  Vision Screening   Right eye Left eye Both eyes  Without  correction     With correction 20/25 20/25      9.  Counseling: He is advised to continue healthy lifestyle changes   10. Lab orders based on risk factors: Laboratory update will be reviewed   11. Referral : None today   12. Care plan: Follow-up with me in 6 months   13. Cognitive assessment: No cognitive impairment   14. Screening: Patient provided with a written and personalized 5-10 year screening schedule in the AVS. yes   15. Provider List Update: PCP, oncology, dermatology  16. Advance Directives: Full code   17. Opioids: Patient is on chronic opioids. She has a signed pain contract with me. Has not displayed any signs of an opioid-use disorder.   [place] Office Visit from 08/29/2020 in [place] [facility]  PHQ-9 Total Score 1       Fall Risk 08/27/2020 08/29/2020 10/23/2020 02/25/2021 09/01/2021  Falls in the past year? - 0 0 - 1  Was there an injury with Fall? - 0 0 - 0  Fall Risk Category Calculator - 0 0 - 1  Fall Risk Category - Low Low - Low  Patient Fall Risk Level High fall risk - - High fall risk Low fall risk  Patient at Risk for Falls Due to - - Impaired balance/gait;Impaired vision - History of fall(s)  Fall risk Follow up - - Falls prevention discussed - Falls evaluation completed     Impression and Plan:  Encounter for preventive health examination  -Recommend routine eye and dental care. -Immunizations: All immunizations are up-to-date and age-appropriate -Healthy lifestyle discussed in detail. -Labs to be updated today. -Colon cancer screening: 06/2016, no further due to age -Breast cancer screening: Not applicable -Cervical cancer screening: Not applicable -Lung cancer screening: Not applicable -Prostate cancer screening: PSA today -DEXA: Not applicable  Erectile dysfunction, unspecified erectile dysfunction type -Levitra as needed  Acquired hypothyroidism  - Plan: TSH  Lymphoma, large cell ([place])  - Plan: CBC with  Differential/Platelet, Comprehensive metabolic panel -Followed by oncology, I am okay with taking prescription of oxycodone and gabapentin once they are no longer filled by oncology.  Right foot drop -Chronic, noted  Vitamin B12 deficiency  - Plan: Vitamin B12  BPH associated with nocturia  - Plan: PSA    Patient Instructions  -Nice seeing you today!!  -Return fasting for labs.  -Schedule follow up in 6 months.      Lelon Frohlich, MD [facility]

## 2021-09-01 NOTE — Patient Instructions (Signed)
-  Nice seeing you today!!  -Return fasting for labs.  -Schedule follow up in 6 months. 

## 2021-09-02 ENCOUNTER — Other Ambulatory Visit (INDEPENDENT_AMBULATORY_CARE_PROVIDER_SITE_OTHER): Payer: Medicare Other

## 2021-09-02 DIAGNOSIS — Z0000 Encounter for general adult medical examination without abnormal findings: Secondary | ICD-10-CM | POA: Diagnosis not present

## 2021-09-02 DIAGNOSIS — N401 Enlarged prostate with lower urinary tract symptoms: Secondary | ICD-10-CM

## 2021-09-02 DIAGNOSIS — R351 Nocturia: Secondary | ICD-10-CM | POA: Diagnosis not present

## 2021-09-02 DIAGNOSIS — C8580 Other specified types of non-Hodgkin lymphoma, unspecified site: Secondary | ICD-10-CM | POA: Diagnosis not present

## 2021-09-02 DIAGNOSIS — E538 Deficiency of other specified B group vitamins: Secondary | ICD-10-CM | POA: Diagnosis not present

## 2021-09-02 DIAGNOSIS — E039 Hypothyroidism, unspecified: Secondary | ICD-10-CM | POA: Diagnosis not present

## 2021-09-02 LAB — COMPREHENSIVE METABOLIC PANEL
ALT: 26 U/L (ref 0–53)
AST: 20 U/L (ref 0–37)
Albumin: 3.9 g/dL (ref 3.5–5.2)
Alkaline Phosphatase: 78 U/L (ref 39–117)
BUN: 9 mg/dL (ref 6–23)
CO2: 29 mEq/L (ref 19–32)
Calcium: 9.3 mg/dL (ref 8.4–10.5)
Chloride: 103 mEq/L (ref 96–112)
Creatinine, Ser: 1 mg/dL (ref 0.40–1.50)
GFR: 71.3 mL/min (ref >60.00)
Glucose, Bld: 92 mg/dL (ref 70–99)
Potassium: 4.3 mEq/L (ref 3.5–5.1)
Sodium: 142 mEq/L (ref 135–145)
Total Bilirubin: 0.8 mg/dL (ref 0.2–1.2)
Total Protein: 6.3 g/dL (ref 6.0–8.3)

## 2021-09-02 LAB — CBC WITH DIFFERENTIAL/PLATELET
Basophils Absolute: 0.1 10*3/uL (ref 0.0–0.1)
Basophils Relative: 1.2 % (ref 0.0–3.0)
Eosinophils Absolute: 0.3 10*3/uL (ref 0.0–0.7)
Eosinophils Relative: 3.3 % (ref 0.0–5.0)
HCT: 44.3 % (ref 39.0–52.0)
Hemoglobin: 14.5 g/dL (ref 13.0–17.0)
Lymphocytes Relative: 31.1 % (ref 12.0–46.0)
Lymphs Abs: 2.8 10*3/uL (ref 0.7–4.0)
MCHC: 32.6 g/dL (ref 30.0–36.0)
MCV: 97.5 fl (ref 78.0–100.0)
Monocytes Absolute: 1.8 10*3/uL — ABNORMAL HIGH (ref 0.1–1.0)
Monocytes Relative: 20.1 % — ABNORMAL HIGH (ref 3.0–12.0)
Neutro Abs: 4 10*3/uL (ref 1.4–7.7)
Neutrophils Relative %: 44.3 % (ref 43.0–77.0)
Platelets: 214 10*3/uL (ref 150.0–400.0)
RBC: 4.54 Mil/uL (ref 4.22–5.81)
RDW: 14.3 % (ref 11.5–15.5)
WBC: 9.1 10*3/uL (ref 4.0–10.5)

## 2021-09-02 LAB — LIPID PANEL
Cholesterol: 187 mg/dL (ref 0–200)
HDL: 42.9 mg/dL (ref >39.00)
LDL Cholesterol: 118 mg/dL — ABNORMAL HIGH (ref 0–99)
NonHDL: 144.4
Total CHOL/HDL Ratio: 4
Triglycerides: 133 mg/dL (ref 0.0–149.0)
VLDL: 26.6 mg/dL (ref 0.0–40.0)

## 2021-09-02 LAB — PSA: PSA: 5.9 ng/mL — ABNORMAL HIGH (ref 0.10–4.00)

## 2021-09-02 LAB — VITAMIN B12: Vitamin B-12: 340 pg/mL (ref 211–911)

## 2021-09-02 LAB — TSH: TSH: 5.12 u[IU]/mL (ref 0.35–5.50)

## 2021-09-29 ENCOUNTER — Telehealth: Payer: Self-pay

## 2021-09-29 ENCOUNTER — Other Ambulatory Visit: Payer: Self-pay | Admitting: Internal Medicine

## 2021-09-29 ENCOUNTER — Other Ambulatory Visit: Payer: Self-pay | Admitting: Nurse Practitioner

## 2021-09-29 DIAGNOSIS — F411 Generalized anxiety disorder: Secondary | ICD-10-CM

## 2021-09-29 DIAGNOSIS — C8580 Other specified types of non-Hodgkin lymphoma, unspecified site: Secondary | ICD-10-CM

## 2021-09-29 DIAGNOSIS — M21371 Foot drop, right foot: Secondary | ICD-10-CM

## 2021-09-29 MED ORDER — HYDROCODONE-ACETAMINOPHEN 5-325 MG PO TABS: 1.0000 | ORAL_TABLET | Freq: Two times a day (BID) | ORAL | 0 refills | Status: DC | PRN

## 2021-09-29 NOTE — Telephone Encounter (Signed)
Patient 's wife called and request a refill of his Norco, the request was placed on th NP's desk ?

## 2021-10-27 ENCOUNTER — Inpatient Hospital Stay: Payer: Medicare Other | Admitting: Oncology

## 2021-10-28 ENCOUNTER — Other Ambulatory Visit: Payer: Self-pay | Admitting: Internal Medicine

## 2021-10-28 DIAGNOSIS — Z961 Presence of intraocular lens: Secondary | ICD-10-CM | POA: Diagnosis not present

## 2021-10-28 DIAGNOSIS — C8580 Other specified types of non-Hodgkin lymphoma, unspecified site: Secondary | ICD-10-CM

## 2021-10-28 NOTE — Telephone Encounter (Signed)
Pt wife is calling and [place] md is no longer refill pt HYDROcodone-acetaminophen (NORCO) 5-325 MG tablet and would like to speak to rachel to see about getting a refill on medication  ?[facility] [address], [place] AT [address] Phone:  [phone]  ?Fax:  [phone]  ?  ? ?

## 2021-10-29 ENCOUNTER — Ambulatory Visit: Payer: Medicare Other

## 2021-10-29 ENCOUNTER — Ambulatory Visit (INDEPENDENT_AMBULATORY_CARE_PROVIDER_SITE_OTHER): Payer: Medicare Other

## 2021-10-29 ENCOUNTER — Other Ambulatory Visit: Payer: Self-pay | Admitting: Internal Medicine

## 2021-10-29 VITALS — Ht 72.0 in | Wt 165.0 lb

## 2021-10-29 DIAGNOSIS — Z0000 Encounter for general adult medical examination without abnormal findings: Secondary | ICD-10-CM

## 2021-10-29 DIAGNOSIS — C8580 Other specified types of non-Hodgkin lymphoma, unspecified site: Secondary | ICD-10-CM

## 2021-10-29 MED ORDER — HYDROCODONE-ACETAMINOPHEN 5-325 MG PO TABS: 1.0000 | ORAL_TABLET | Freq: Two times a day (BID) | ORAL | 0 refills | Status: DC | PRN

## 2021-10-29 NOTE — Telephone Encounter (Signed)
Wife returned call and stated that Dr Jerilee Hoh was okay with filling the Hydrocodone.  He does not want the gabapentin. ? ?Office note 09/01/21 ? ? He also takes gabapentin and hydrocodone that are prescribed by his oncologist.  He tells me that he is about to be released from oncology care and is wondering whether I can take over the prescription for hydrocodone and gabapentin.  ?

## 2021-10-29 NOTE — Addendum Note (Signed)
Addended by: Westley Hummer B on: 10/29/2021 03:04 PM ? ? Modules accepted: Orders ? ?

## 2021-10-29 NOTE — Patient Instructions (Addendum)
?Perry Nielsen , ?Thank you for taking time to come for your Medicare Wellness Visit. I appreciate your ongoing commitment to your health goals. Please review the following plan we discussed and let me know if I can assist you in the future.  ? ?These are the goals we discussed: ? Goals   ? ?   patient (pt-stated)   ?   Keep swimming and staying active. ?  ?   Patient Stated   ?   None at this time  ?  ? ?  ?  ?This is a list of the screening recommended for you and due dates:  ?Health Maintenance  ?Topic Date Due  ? Flu Shot  02/03/2022  ? Tetanus Vaccine  03/24/2027  ? Pneumonia Vaccine  Completed  ? COVID-19 Vaccine  Completed  ? Zoster (Shingles) Vaccine  Completed  ? HPV Vaccine  Aged Out  ?  ?Opioid Pain Medicine Management ?Opioids are powerful medicines that are used to treat moderate to severe pain. When used for short periods of time, they can help you to: ?Sleep better. ?Do better in physical or occupational therapy. ?Feel better in the first few days after an injury. ?Recover from surgery. ?Opioids should be taken with the supervision of a trained health care provider. They should be taken for the shortest period of time possible. This is because opioids can be addictive, and the longer you take opioids, the greater your risk of addiction. This addiction can also be called opioid use disorder. ?What are the risks? ?Using opioid pain medicines for longer than 3 days increases your risk of side effects. Side effects include: ?Constipation. ?Nausea and vomiting. ?Breathing difficulties (respiratory depression). ?Drowsiness. ?Confusion. ?Opioid use disorder. ?Itching. ?Taking opioid pain medicine for a long period of time can affect your ability to do daily tasks. It also puts you at risk for: ?Motor vehicle crashes. ?Depression. ?Suicide. ?Heart attack. ?Overdose, which can be life-threatening. ?What is a pain treatment plan? ?A pain treatment plan is an agreement between you and your health care provider.  Pain is unique to each person, and treatments vary depending on your condition. To manage your pain, you and your health care provider need to work together. To help you do this: ?Discuss the goals of your treatment, including how much pain you might expect to have and how you will manage the pain. ?Review the risks and benefits of taking opioid medicines. ?Remember that a good treatment plan uses more than one approach and minimizes the chance of side effects. ?Be honest about the amount of medicines you take and about any drug or alcohol use. ?Get pain medicine prescriptions from only one health care provider. ?Pain can be managed with many types of alternative treatments. Ask your health care provider to refer you to one or more specialists who can help you manage pain through: ?Physical or occupational therapy. ?Counseling (cognitive behavioral therapy). ?Good nutrition. ?Biofeedback. ?Massage. ?Meditation. ?Non-opioid medicine. ?Following a gentle exercise program. ?How to use opioid pain medicine ?Taking medicine ?Take your pain medicine exactly as told by your health care provider. Take it only when you need it. ?If your pain gets less severe, you may take less than your prescribed dose if your health care provider approves. ?If you are not having pain, do nottake pain medicine unless your health care provider tells you to take it. ?If your pain is severe, do nottry to treat it yourself by taking more pills than instructed on your prescription. Contact your health  care provider for help. ?Write down the times when you take your pain medicine. It is easy to become confused while on pain medicine. Writing the time can help you avoid overdose. ?Take other over-the-counter or prescription medicines only as told by your health care provider. ?Keeping yourself and others safe ? ?While you are taking opioid pain medicine: ?Do not drive, use machinery, or power tools. ?Do not sign legal documents. ?Do not drink  alcohol. ?Do not take sleeping pills. ?Do not supervise children by yourself. ?Do not do activities that require climbing or being in high places. ?Do not go to a lake, river, ocean, spa, or swimming pool. ?Do not share your pain medicine with anyone. ?Keep pain medicine in a locked cabinet or in a secure area where pets and children cannot reach it. ?Stopping your use of opioids ?If you have been taking opioid medicine for more than a few weeks, you may need to slowly decrease (taper) how much you take until you stop completely. Tapering your use of opioids can decrease your risk of symptoms of withdrawal, such as: ?Pain and cramping in the abdomen. ?Nausea. ?Sweating. ?Sleepiness. ?Restlessness. ?Uncontrollable shaking (tremors). ?Cravings for the medicine. ?Do not attempt to taper your use of opioids on your own. Talk with your health care provider about how to do this. Your health care provider may prescribe a step-down schedule based on how much medicine you are taking and how long you have been taking it. ?Getting rid of leftover pills ?Do not save any leftover pills. Get rid of leftover pills safely by: ?Taking the medicine to a prescription take-back program. This is usually offered by the county or law enforcement. ?Bringing them to a pharmacy that has a drug disposal container. ?Flushing them down the toilet. Check the label or package insert of your medicine to see whether this is safe to do. ?Throwing them out in the trash. Check the label or package insert of your medicine to see whether this is safe to do. ?If it is safe to throw it out, remove the medicine from the original container, put it into a sealable bag or container, and mix it with used coffee grounds, food scraps, dirt, or cat litter before putting it in the trash. ?Follow these instructions at home: ?Activity ?Do exercises as told by your health care provider. ?Avoid activities that make your pain worse. ?Return to your normal activities as  told by your health care provider. Ask your health care provider what activities are safe for you. ?General instructions ?You may need to take these actions to prevent or treat constipation: ?Drink enough fluid to keep your urine pale yellow. ?Take over-the-counter or prescription medicines. ?Eat foods that are high in fiber, such as beans, whole grains, and fresh fruits and vegetables. ?Limit foods that are high in fat and processed sugars, such as fried or sweet foods. ?Keep all follow-up visits. This is important. ?Where to find support ?If you have been taking opioids for a long time, you may benefit from receiving support for quitting from a local support group or counselor. Ask your health care provider for a referral to these resources in your area. ?Where to find more information ?Centers for Disease Control and Prevention (CDC): http://www.wolf.info/ ?[place] Food and Drug Administration (FDA): [place].ch ?Get help right away if: ?You may have taken too much of an opioid (overdosed). Common symptoms of an overdose: ?Your breathing is slower or more shallow than normal. ?You have a very slow heartbeat (pulse). ?You  have slurred speech. ?You have nausea and vomiting. ?Your pupils become very small. ?You have other potential symptoms: ?You are very confused. ?You faint or feel like you will faint. ?You have cold, clammy skin. ?You have blue lips or fingernails. ?You have thoughts of harming yourself or harming others. ?These symptoms may represent a serious problem that is an emergency. Do not wait to see if the symptoms will go away. Get medical help right away. Call your local emergency services (911 in the [place] Do not drive yourself to the hospital.  ?If you ever feel like you may hurt yourself or others, or have thoughts about taking your own life, get help right away. Go to your nearest emergency department or: ?Call your local emergency services (911 in the [place] ?Call the [facility]  [phone] in the [place] ?Call a suicide crisis helpline, such as the [place] at (863) 678-1383 or 988 in the [place]. This is open 24 hours a day in the [place] ?Text the Crisis Text Nicoletta Dress

## 2021-10-29 NOTE — Telephone Encounter (Signed)
Left detailed message on machine for patient to schedule an office visit. ?

## 2021-10-29 NOTE — Telephone Encounter (Signed)
Would you like an VV or office visit? ?

## 2021-10-29 NOTE — Progress Notes (Signed)
? ?Subjective:  ? Perry Nielsen. is a 81 y.o. male who presents for Medicare Annual/Subsequent preventive examination. ? ?Review of Systems    ?Virtual Visit via Telephone Note ? ?I connected with  Perry Nielsen. on 10/29/21 at  2:30 PM EDT by telephone and verified that I am speaking with the correct person using two identifiers. ? ?Location: ?Patient: Home ?Provider: Office ?Persons participating in the virtual visit: patient/Nurse Health Advisor ?  ?I discussed the limitations, risks, security and privacy concerns of performing an evaluation and management service by telephone and the availability of in person appointments. The patient expressed understanding and agreed to proceed. ? ?Interactive audio and video telecommunications were attempted between this nurse and patient, however failed, due to patient having technical difficulties OR patient did not have access to video capability.  We continued and completed visit with audio only. ? ?Some vital signs may be absent or patient reported.  ? ?Criselda Peaches, LPN  ?Cardiac Risk Factors include: advanced age (>32mn, >>17women);male gender ? ?   ?Objective:  ?  ?Today's Vitals  ? 10/29/21 1431  ?Weight: 165 lb (74.8 kg)  ?Height: 6' (1.829 m)  ? ?Body mass index is 22.38 kg/m?. ? ? ?  10/29/2021  ?  2:39 PM 10/23/2020  ?  2:48 PM 08/27/2020  ?  3:07 PM 08/14/2019  ? 12:12 PM 08/11/2018  ?  3:36 PM 03/01/2018  ?  3:42 PM 07/23/2017  ?  9:57 AM  ?Advanced Directives  ?Does Patient Have a Medical Advance Directive? Yes Yes Yes Yes Yes Yes Yes  ?Type of [place] Healthcare Power of [place] will Living will  Living will  ?Does patient want to make changes to medical advance directive? No - Patient declined  No - Patient declined  No - Patient declined    ?Copy of [place] Chart? No - copy requested No - copy  requested No - copy requested No - copy requested     ? ? ?Current Medications (verified) ?Outpatient Encounter Medications as of 10/29/2021  ?Medication Sig  ? amitriptyline (ELAVIL) 50 MG tablet Take 50 mg by mouth at bedtime.  ? cyanocobalamin (,VITAMIN B-12,) 1000 MCG/ML injection Inject 165min deltoid once weekly for 4 weeks, then inject 1 ml once a month thereafter  ? HYDROcodone-acetaminophen (NORCO) 5-325 MG tablet Take 1 tablet by mouth 2 (two) times daily as needed for moderate pain.  ? levothyroxine (SYNTHROID) 25 MCG tablet TAKE 1 TABLET(25 MCG) BY MOUTH DAILY BEFORE BREAKFAST  ? LORazepam (ATIVAN) 0.5 MG tablet TAKE 1 TABLET(0.5 MG) BY MOUTH TWICE DAILY AS NEEDED FOR ANXIETY  ? oxymetazoline (AFRIN) 0.05 % nasal spray Place 1 spray into both nostrils 2 (two) times daily as needed for congestion.  ? Polyethyl Glycol-Propyl Glycol (SYSTANE OP) Apply 1 drop to eye daily as needed (dryness).  ? Polyethylene Glycol 3350 (MIRALAX PO) Take 17 g by mouth daily as needed.  ? SYRINGE-NEEDLE, DISP, 3 ML (BD SAFETYGLIDE SYRINGE/NEEDLE) 25G X 1" 3 ML MISC Use for B12 injections  ? vardenafil (LEVITRA) 20 MG tablet Take 1 tablet (20 mg total) by mouth daily as needed for erectile dysfunction.  ? ?No facility-administered encounter medications on file as of 10/29/2021.  ? ? ?Allergies (verified) ?Antihistamines, diphenhydramine-type  ? ?History: ?Past Medical History:  ?Diagnosis Date  ? ALLERGIC RHINITIS 08/03/2007  ? BACK PAIN 09/18/2009  ?  Basal cell carcinoma 08/21/2004  ? right wing nose ([facility])  ? BCC (basal cell carcinoma of skin) 06/07/2001  ? mid upper back  ? BCC (basal cell carcinoma) 03/08/2013  ? right forehead (curet & carter)  ? 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 Qualifier: Diagnosis of  By: Tiney Rouge CMA, Ellison Hughs    ? CAROTID BRUIT, LEFT 08/07/2008  ? 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 Foot drop   ? rt foot  ? GERD 02/18/2007  ? HAMMER TOE 08/03/2007  ? Hepatitis 1977  ? no residual problems, type A  ?  KNEE PAIN, LEFT, CHRONIC 08/07/2008  ? 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 Rash, skin   ? torso  ? 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 right nose (caulery, 37f)  ? SCC (squamous cell carcinoma) 11/24/2016  ? left upper arm (curetx3, 561f  ? SCC (squamous cell carcinoma) 04/20/2017  ? mid upper forehead  (tx after bx)  ? SCC (squamous cell carcinoma) 05/11/2019  ? left scapha  ? SCC (squamous cell carcinoma) 08/17/2019  ? mid back  ? Skin cancer   ? Sleeping difficulty   ? Squamous cell carcinoma of skin 07/31/2003  ? right brow (curetx3, 69f58f ? ?Past Surgical History:  ?Procedure Laterality Date  ? CATARACT EXTRACTION W/ INTRAOCULAR LENS  IMPLANT, BILATERAL Bilateral   ? KNEE ARTHROSCOPY    ? left  ? 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 Procedure: LAPAROSCOPIC SPLENECTOMY;  Surgeon: BenExcell SeltzerD;  Location: [facility];  Service: General;  Laterality: N/A;  ? SHOULDER OPEN ROTATOR CUFF REPAIR  07/06/05  ? right  ? TONSILLECTOMY AND ADENOIDECTOMY    ? TRANSURETHRAL RESECTION OF PROSTATE    ? ?Family History  ?Problem Relation Age of Onset  ? Pulmonary fibrosis Mother   ? Uterine cancer Mother   ? Prostate cancer Father   ? Heart disease Brother   ? Heart attack Other   ? Aneurysm Other   ?     aortic  ? Coronary artery disease Sister   ? ?Social History  ? ?Socioeconomic History  ? Marital status: Married  ?  Spouse name: Not on file  ? Number of children: 2  ? Years of education: Not on file  ? Highest education level: Not on file  ?Occupational History  ? 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 Smoking status: Never  ? Smokeless tobacco: Never  ?Vaping Use  ? Vaping Use: Never used  ?Substance and Sexual Activity  ? Alcohol use: Yes  ?  Alcohol/week: 0.0 standard drinks  ?  Comment: rarely   ? Drug use: No  ? Sexual activity: Not on file  ?Other Topics Concern  ? Not on file  ?Social History Narrative  ? Married.  Very active.    ? ?Social Determinants of Health  ? ?Financial  Resource Strain: Low Risk   ? Difficulty of Paying Living Expenses: Not hard at all  ?Food Insecurity: No Food Insecurity  ? Worried About RunCharity fundraiser the Last Year: Never true  ? Ran Out of Food in the Last Year: Never true  ?Transportation Needs: No Transportation Needs  ? Lack of Transportation (Medical): No  ? Lack of Transportation (Non-Medical): No  ?Physical Activity: Sufficiently Active  ? Days of Exercise per Week: 5 days  ? Minutes of Exercise per Session: 60 min  ?Stress: No Stress Concern Present  ? Feeling of Stress :  Not at all  ?Social Connections: Socially Integrated  ? Frequency of Communication with Friends and Family: More than three times a week  ? Frequency of Social Gatherings with Friends and Family: More than three times a week  ? Attends Religious Services: More than 4 times per year  ? Active Member of Clubs or Organizations: Yes  ? Attends Archivist Meetings: More than 4 times per year  ? Marital Status: Married  ? ?Clinical Intake: ? ?Pre-visit preparation completed: No ?Diabetic?  No ? ?Interpreter Needed?: No ?Activities of Daily Living ? ?  10/29/2021  ?  2:38 PM  ?In your present state of health, do you have any difficulty performing the following activities:  ?Hearing? 0  ?Vision? 0  ?Difficulty concentrating or making decisions? 0  ?Walking or climbing stairs? 0  ?Dressing or bathing? 0  ?Doing errands, shopping? 0  ?Preparing Food and eating ? N  ?Using the Toilet? N  ?In the past six months, have you accidently leaked urine? N  ?Do you have problems with loss of bowel control? N  ?Managing your Medications? N  ?Managing your Finances? N  ?Housekeeping or managing your Housekeeping? N  ? ? ?Patient Care Team: ?Isaac Bliss, Rayford Halsted, MD as PCP - General (Internal Medicine) ?Lavonna Monarch, MD as Consulting Physician (Dermatology) ?Starlyn Skeans as Librarian, academic (Dermatology) ? ?Indicate any recent Medical Services you may have received from  other than [facility] providers in the past year (date may be approximate). ? ?   ?Assessment:  ? This is a routine wellness examination for Winn. ? ?Hearing/Vision screen ?Hearing Screening - Comments:: No he

## 2021-10-30 ENCOUNTER — Telehealth: Payer: Self-pay | Admitting: *Deleted

## 2021-10-30 NOTE — Telephone Encounter (Signed)
PA started for ?Hydrocodone 5/325 ?Key: [id number] ?Spoke with Lattie Haw a [place] ?PA not needed. ?

## 2021-11-17 ENCOUNTER — Encounter: Payer: Self-pay | Admitting: Internal Medicine

## 2021-11-17 ENCOUNTER — Ambulatory Visit (INDEPENDENT_AMBULATORY_CARE_PROVIDER_SITE_OTHER): Payer: Medicare Other | Admitting: Internal Medicine

## 2021-11-17 VITALS — BP 120/90 | HR 91 | Temp 98.2°F | Wt 169.3 lb

## 2021-11-17 DIAGNOSIS — C8580 Other specified types of non-Hodgkin lymphoma, unspecified site: Secondary | ICD-10-CM | POA: Diagnosis not present

## 2021-11-17 DIAGNOSIS — K5903 Drug induced constipation: Secondary | ICD-10-CM | POA: Diagnosis not present

## 2021-11-17 DIAGNOSIS — C7951 Secondary malignant neoplasm of bone: Secondary | ICD-10-CM

## 2021-11-17 MED ORDER — HYDROCODONE-ACETAMINOPHEN 5-325 MG PO TABS: 1.0000 | ORAL_TABLET | Freq: Two times a day (BID) | ORAL | 0 refills | Status: DC | PRN

## 2021-11-17 NOTE — Progress Notes (Signed)
? ? ? ?Established Patient Office Visit ? ? ? ? ?CC/Reason for Visit: Medication refills ? ?HPI: Perry Zechman. is a 81 y.o. male who is coming in today for the above mentioned reasons. Past Medical History is significant for: Large B-cell lymphoma with spinal metastases.  He is being released from oncology care who had been prescribing his narcotics and they want me to take over his prescription.  He is prescribed hydrocodone 5/325 mg that he takes 1 tablet twice daily as needed.  He also has significant constipation and has not been taking any precautions. ? ? ?Past Medical/Surgical History: ?Past Medical History:  ?Diagnosis Date  ? ALLERGIC RHINITIS 08/03/2007  ? BACK PAIN 09/18/2009  ? Basal cell carcinoma 08/21/2004  ? right wing nose (MOHS)  ? BCC (basal cell carcinoma of skin) 06/07/2001  ? mid upper back  ? BCC (basal cell carcinoma) 03/08/2013  ? right forehead (curet & carter)  ? 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 Qualifier: Diagnosis of  By: Tiney Rouge CMA, Ellison Hughs    ? CAROTID BRUIT, LEFT 08/07/2008  ? 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 Foot drop   ? rt foot  ? GERD 02/18/2007  ? HAMMER TOE 08/03/2007  ? Hepatitis 1977  ? no residual problems, type A  ? KNEE PAIN, LEFT, CHRONIC 08/07/2008  ? Lymphoma ([place])   ? right hip  ? Rash, skin   ? torso  ? 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 right nose (caulery, 21f)  ? SCC (squamous cell carcinoma) 11/24/2016  ? left upper arm (curetx3, 566f  ? SCC (squamous cell carcinoma) 04/20/2017  ? mid upper forehead  (tx after bx)  ? SCC (squamous cell carcinoma) 05/11/2019  ? left scapha  ? SCC (squamous cell carcinoma) 08/17/2019  ? mid back  ? Skin cancer   ? Sleeping difficulty   ? Squamous cell carcinoma of skin 07/31/2003  ? right brow (curetx3, 25f62f ? ? ?Past Surgical History:  ?Procedure Laterality Date  ? CATARACT EXTRACTION W/ INTRAOCULAR LENS  IMPLANT, BILATERAL Bilateral   ? KNEE ARTHROSCOPY    ? left  ? 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  Procedure: LAPAROSCOPIC SPLENECTOMY;  Surgeon: BenExcell SeltzerD;  Location: [facility];  Service: General;  Laterality: N/A;  ? SHOULDER OPEN ROTATOR CUFF REPAIR  07/06/05  ? right  ? TONSILLECTOMY AND ADENOIDECTOMY    ? TRANSURETHRAL RESECTION OF PROSTATE    ? ? ?Social History: ? reports that he has never smoked. He has never used smokeless tobacco. He reports current alcohol use. He reports that he does not use drugs. ? ?Allergies: ?Allergies  ?Allergen Reactions  ? Antihistamines, Diphenhydramine-Type   ?  CAUSES PROSTATE TO SWELL & as a child had hyperactivity when took Benadryl ?Pt took Pepcid w/ Benadryl PO and did fine per Pt & his wife  ? ? ?Family History:  ?Family History  ?Problem Relation Age of Onset  ? Pulmonary fibrosis Mother   ? Uterine cancer Mother   ? Prostate cancer Father   ? Heart disease Brother   ? Heart attack Other   ? Aneurysm Other   ?     aortic  ? Coronary artery disease Sister   ? ? ? ?Current Outpatient Medications:  ?  amitriptyline (ELAVIL) 50 MG tablet, Take 50 mg by mouth at bedtime., Disp: , Rfl:  ?  cyanocobalamin (,VITAMIN B-12,) 1000 MCG/ML injection, Inject 1ml2m deltoid once weekly for 4 weeks, then inject  1 ml once a month thereafter, Disp: 6 mL, Rfl: 3 ?  HYDROcodone-acetaminophen (NORCO/VICODIN) 5-325 MG tablet, Take 1 tablet by mouth 2 (two) times daily as needed for moderate pain., Disp: 60 tablet, Rfl: 0 ?  HYDROcodone-acetaminophen (NORCO/VICODIN) 5-325 MG tablet, Take 1 tablet by mouth 2 (two) times daily as needed for moderate pain., Disp: 60 tablet, Rfl: 0 ?  levothyroxine (SYNTHROID) 25 MCG tablet, TAKE 1 TABLET(25 MCG) BY MOUTH DAILY BEFORE BREAKFAST, Disp: 90 tablet, Rfl: 0 ?  LORazepam (ATIVAN) 0.5 MG tablet, TAKE 1 TABLET(0.5 MG) BY MOUTH TWICE DAILY AS NEEDED FOR ANXIETY, Disp: 60 tablet, Rfl: 2 ?  oxymetazoline (AFRIN) 0.05 % nasal spray, Place 1 spray into both nostrils 2 (two) times daily as needed for congestion., Disp: , Rfl:  ?  Polyethyl  Glycol-Propyl Glycol (SYSTANE OP), Apply 1 drop to eye daily as needed (dryness)., Disp: , Rfl:  ?  Polyethylene Glycol 3350 (MIRALAX PO), Take 17 g by mouth daily as needed., Disp: , Rfl:  ?  SYRINGE-NEEDLE, DISP, 3 ML (BD SAFETYGLIDE SYRINGE/NEEDLE) 25G X 1" 3 ML MISC, Use for B12 injections, Disp: 100 each, Rfl: 11 ?  vardenafil (LEVITRA) 20 MG tablet, Take 1 tablet (20 mg total) by mouth daily as needed for erectile dysfunction., Disp: 20 tablet, Rfl: 2 ?  HYDROcodone-acetaminophen (NORCO) 5-325 MG tablet, Take 1 tablet by mouth 2 (two) times daily as needed for moderate pain., Disp: 60 tablet, Rfl: 0 ? ?Review of Systems:  ?Constitutional: Denies fever, chills, diaphoresis, appetite change and fatigue.  ?HEENT: Denies photophobia, eye pain, redness, hearing loss, ear pain, congestion, sore throat, rhinorrhea, sneezing, mouth sores, trouble swallowing, neck pain, neck stiffness and tinnitus.   ?Respiratory: Denies SOB, DOE, cough, chest tightness,  and wheezing.   ?Cardiovascular: Denies chest pain, palpitations and leg swelling.  ?Gastrointestinal: Denies nausea, vomiting, abdominal pain, diarrhea, constipation, blood in stool and abdominal distention.  ?Genitourinary: Denies dysuria, urgency, frequency, hematuria, flank pain and difficulty urinating.  ?Endocrine: Denies: hot or cold intolerance, sweats, changes in hair or nails, polyuria, polydipsia. ?Musculoskeletal: Denies myalgias, back pain, joint swelling, arthralgias and gait problem.  ?Skin: Denies pallor, rash and wound.  ?Neurological: Denies dizziness, seizures, syncope, weakness, light-headedness, numbness and headaches.  ?Hematological: Denies adenopathy. Easy bruising, personal or family bleeding history  ?Psychiatric/Behavioral: Denies suicidal ideation, mood changes, confusion, nervousness, sleep disturbance and agitation ? ? ? ?Physical Exam: ?Vitals:  ? 11/17/21 1512 11/17/21 1516  ?BP: (!) 130/100 120/90  ?Pulse: 91   ?Temp: 98.2 ?F (36.8 ?C)    ?TempSrc: Oral   ?SpO2: 91%   ?Weight: 169 lb 4.8 oz (76.8 kg)   ? ? ?Body mass index is 22.96 kg/m?. ? ? ?Constitutional: NAD, calm, comfortable ?Eyes: PERRL, lids and conjunctivae normal, wears corrective lenses ?ENMT: Mucous membranes are moist.   ?Psychiatric: Normal judgment and insight. Alert and oriented x 3. Normal mood.  ? ? ?Impression and Plan: ? ?Metastasis to spinal column ([place]) ? ?Lymphoma, large cell ([place]) - Plan: HYDROcodone-acetaminophen (NORCO) 5-325 MG tablet, HYDROcodone-acetaminophen (NORCO/VICODIN) 5-325 MG tablet, HYDROcodone-acetaminophen (NORCO/VICODIN) 5-325 MG tablet ? ?Drug-induced constipation ? ?-PDMP has been reviewed, overdose risk score is 180.  He has 1 red flag for greater than 5 providers in the last 2 years.  All of these providers have been through the oncology office. ?-I will refill hydrocodone 5/325 mg to take 1 tablet every 12 hours as needed for pain for total of 60 tablets a month x3 months. ?-Pain contract has been  signed today by patient. ?-Have advised that he take a daily stool softener while on opioids. ? ?Time spent:22 minutes reviewing chart, interviewing and examining patient and formulating plan of care. ? ? ? ? ?Lelon Frohlich, MD ?[facility] ? ? ?

## 2021-11-23 ENCOUNTER — Other Ambulatory Visit: Payer: Self-pay | Admitting: Internal Medicine

## 2021-11-23 DIAGNOSIS — E039 Hypothyroidism, unspecified: Secondary | ICD-10-CM

## 2021-11-24 ENCOUNTER — Telehealth: Payer: Self-pay | Admitting: Internal Medicine

## 2021-11-24 NOTE — Telephone Encounter (Addendum)
Pt wife is calling and was only able to pick up #14 HYDROcodone-acetaminophen (NORCO) 5-325 MG tablet on 11-19-2021. Pt wife md needs to call the [facility], [address] AT [address] Phone:  [phone]  Fax:  [phone]

## 2021-11-24 NOTE — Telephone Encounter (Signed)
Spoke to wife, Early Chars. Made her aware that we spoke to pharmacy, [facility]. It would need a PA. She would get a notification from the pharmacy.

## 2021-11-24 NOTE — Telephone Encounter (Signed)
PA started for Hydrocodone Key: [id number]

## 2021-11-26 NOTE — Telephone Encounter (Signed)
PA was approved 11/25/21 - 11/26/22. Left message on machine for patient.

## 2021-12-11 ENCOUNTER — Encounter: Payer: Self-pay | Admitting: Internal Medicine

## 2021-12-11 ENCOUNTER — Ambulatory Visit (INDEPENDENT_AMBULATORY_CARE_PROVIDER_SITE_OTHER): Payer: Medicare Other | Admitting: Internal Medicine

## 2021-12-11 VITALS — BP 110/82 | HR 90 | Temp 97.8°F | Wt 162.8 lb

## 2021-12-11 DIAGNOSIS — R413 Other amnesia: Secondary | ICD-10-CM | POA: Diagnosis not present

## 2021-12-11 NOTE — Progress Notes (Signed)
Established Patient Office Visit     CC/Reason for Visit: Memory loss  HPI: Perry Nielsen. is a 81 y.o. male who is coming in today for the above mentioned reasons.  His wife brings him in today because she is concerned about progressive memory loss.  She states that she has noticed this for about 2 years.  He will be constantly repeating things.  He forgets the names of close friends.  She states that his close family and friends have also noticed the same.  As we are finishing the visit he states "were you in my same class at [employer]?".  Past Medical/Surgical History: Past Medical History:  Diagnosis Date   ALLERGIC RHINITIS 08/03/2007   BACK PAIN 09/18/2009   Basal cell carcinoma 08/21/2004   right wing nose (MOHS)   BCC (basal cell carcinoma of skin) 06/07/2001   mid upper back   BCC (basal cell carcinoma) 03/08/2013   right forehead (curet & carter)   BPH associated with nocturia 02/18/2007   Qualifier: Diagnosis of  By: Tiney Rouge CMA, [place], LEFT 08/07/2008   ERECTILE DYSFUNCTION, MILD 08/07/2008   Foot drop    rt foot   GERD 02/18/2007   HAMMER TOE 08/03/2007   Hepatitis 1977   no residual problems, type A   KNEE PAIN, LEFT, CHRONIC 08/07/2008   Lymphoma ([place])    right hip   Rash, skin    torso   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   right nose (caulery, 103f)   SCC (squamous cell carcinoma) 11/24/2016   left upper arm (curetx3, 520f   SCC (squamous cell carcinoma) 04/20/2017   mid upper forehead  (tx after bx)   SCC (squamous cell carcinoma) 05/11/2019   left scapha   SCC (squamous cell carcinoma) 08/17/2019   mid back   Skin cancer    Sleeping difficulty    Squamous cell carcinoma of skin 07/31/2003   right brow (curetx3, 62f24f   Past Surgical History:  Procedure Laterality Date   CATARACT EXTRACTION W/ INTRAOCULAR LENS  IMPLANT, BILATERAL Bilateral    KNEE ARTHROSCOPY     left   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 He has never used smokeless tobacco. He reports current alcohol use. He reports that he does not use drugs.  Allergies: Allergies  Allergen Reactions   Antihistamines, Diphenhydramine-Type     CAUSES PROSTATE TO SWELL & as a child had hyperactivity when took Benadryl Pt took Pepcid w/ Benadryl PO and did fine per Pt & his wife    Family History:  Family History  Problem Relation Age of Onset   Pulmonary fibrosis Mother    Uterine cancer Mother    Prostate cancer Father    Heart disease Brother    Heart attack Other    Aneurysm Other        aortic   Coronary artery disease Sister      Current Outpatient Medications:    amitriptyline (ELAVIL) 50 MG tablet, Take 50 mg by mouth at bedtime., Disp: , Rfl:    HYDROcodone-acetaminophen (NORCO) 5-325 MG tablet, Take 1  tablet by mouth 2 (two) times daily as needed for moderate pain., Disp: 60 tablet, Rfl: 0   HYDROcodone-acetaminophen (NORCO/VICODIN) 5-325 MG tablet, Take 1 tablet by mouth 2 (two) times daily as needed for moderate pain., Disp: 60 tablet, Rfl: 0   HYDROcodone-acetaminophen (NORCO/VICODIN) 5-325 MG tablet, Take 1 tablet by mouth 2 (two) times daily as needed for moderate pain., Disp: 60 tablet, Rfl: 0   levothyroxine (SYNTHROID) 25 MCG tablet, TAKE 1 TABLET(25 MCG) BY MOUTH DAILY BEFORE BREAKFAST, Disp: 90 tablet, Rfl: 0   LORazepam (ATIVAN) 0.5 MG tablet, TAKE 1 TABLET(0.5 MG) BY MOUTH TWICE DAILY AS NEEDED FOR ANXIETY, Disp: 60 tablet, Rfl: 2   oxymetazoline (AFRIN) 0.05 % nasal spray, Place 1 spray into both nostrils 2 (two) times daily as needed for congestion., Disp: , Rfl:    Polyethyl Glycol-Propyl Glycol  (SYSTANE OP), Apply 1 drop to eye daily as needed (dryness)., Disp: , Rfl:    Polyethylene Glycol 3350 (MIRALAX PO), Take 17 g by mouth daily as needed., Disp: , Rfl:    SYRINGE-NEEDLE, DISP, 3 ML (BD SAFETYGLIDE SYRINGE/NEEDLE) 25G X 1" 3 ML MISC, Use for B12 injections, Disp: 100 each, Rfl: 11   cyanocobalamin (,VITAMIN B-12,) 1000 MCG/ML injection, Inject 85m in deltoid once weekly for 4 weeks, then inject 1 ml once a month thereafter (Patient not taking: Reported on 12/11/2021), Disp: 6 mL, Rfl: 3   vardenafil (LEVITRA) 20 MG tablet, Take 1 tablet (20 mg total) by mouth daily as needed for erectile dysfunction. (Patient not taking: Reported on 12/11/2021), Disp: 20 tablet, Rfl: 2  Review of Systems:  Constitutional: Denies fever, chills, diaphoresis, appetite change and fatigue.  HEENT: Denies photophobia, eye pain, redness, hearing loss, ear pain, congestion, sore throat, rhinorrhea, sneezing, mouth sores, trouble swallowing, neck pain, neck stiffness and tinnitus.   Respiratory: Denies SOB, DOE, cough, chest tightness,  and wheezing.   Cardiovascular: Denies chest pain, palpitations and leg swelling.  Gastrointestinal: Denies nausea, vomiting, abdominal pain, diarrhea, constipation, blood in stool and abdominal distention.  Genitourinary: Denies dysuria, urgency, frequency, hematuria, flank pain and difficulty urinating.  Endocrine: Denies: hot or cold intolerance, sweats, changes in hair or nails, polyuria, polydipsia. Musculoskeletal: Denies myalgias, back pain, joint swelling, arthralgias and gait problem.  Skin: Denies pallor, rash and wound.  Neurological: Denies dizziness, seizures, syncope, weakness, light-headedness, numbness and headaches.  Hematological: Denies adenopathy. Easy bruising, personal or family bleeding history  Psychiatric/Behavioral: Denies suicidal ideation, mood changes, confusion, nervousness, sleep disturbance and agitation    Physical Exam: Vitals:   12/11/21  1459  BP: 110/82  Pulse: 90  Temp: 97.8 F (36.6 C)  TempSrc: Oral  SpO2: 97%  Weight: 162 lb 12.8 oz (73.8 kg)    Body mass index is 22.08 kg/m.     12/11/2021    3:09 PM  MMSE - Mini Mental State Exam  Orientation to time 0  Orientation to Place 5  Registration 3  Attention/ Calculation 2  Recall 0  Language- name 2 objects 2  Language- repeat 1  Language- follow 3 step command 3  Language- read & follow direction 1  Write a sentence 1  Copy design 0  Total score 18       Constitutional: NAD, calm, comfortable ambulates with a walker Eyes: PERRL, lids and conjunctivae normal, wears corrective lenses Psychiatric: Normal judgment and insight. Alert and oriented x 3. Normal mood.    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  He could not answer any of the orientation questions, could not complete intersecting drawing, did not have recall. -Will refer to neurology for further evaluation.  Time spent:34 minutes reviewing chart, interviewing and examining patient and formulating plan of care.     Lelon Frohlich, MD [facility]

## 2021-12-12 ENCOUNTER — Encounter: Payer: Self-pay | Admitting: Physician Assistant

## 2021-12-18 ENCOUNTER — Telehealth: Payer: Self-pay | Admitting: *Deleted

## 2021-12-18 NOTE — Telephone Encounter (Signed)
Spoke with pharmacist Orvil Feil, and refill will be filled today.

## 2021-12-18 NOTE — Telephone Encounter (Signed)
Patient's wife is calling because they are going out of town for a week.  She is requesting an early refill for HYDROcodone-acetaminophen (NORCO/VICODIN) 5-325 MG tablet. Okay to refill?

## 2021-12-29 ENCOUNTER — Other Ambulatory Visit: Payer: Self-pay | Admitting: Internal Medicine

## 2021-12-29 DIAGNOSIS — M21371 Foot drop, right foot: Secondary | ICD-10-CM

## 2021-12-29 DIAGNOSIS — F411 Generalized anxiety disorder: Secondary | ICD-10-CM

## 2021-12-31 ENCOUNTER — Encounter: Payer: Self-pay | Admitting: Physician Assistant

## 2021-12-31 ENCOUNTER — Ambulatory Visit: Payer: Medicare Other | Admitting: Physician Assistant

## 2021-12-31 ENCOUNTER — Other Ambulatory Visit (INDEPENDENT_AMBULATORY_CARE_PROVIDER_SITE_OTHER): Payer: Medicare Other

## 2021-12-31 VITALS — BP 109/76 | HR 96 | Resp 18 | Ht 72.0 in | Wt 165.0 lb

## 2021-12-31 DIAGNOSIS — R413 Other amnesia: Secondary | ICD-10-CM | POA: Diagnosis not present

## 2021-12-31 DIAGNOSIS — G309 Alzheimer's disease, unspecified: Secondary | ICD-10-CM | POA: Diagnosis not present

## 2021-12-31 DIAGNOSIS — F0280 Dementia in other diseases classified elsewhere without behavioral disturbance: Secondary | ICD-10-CM

## 2021-12-31 LAB — VITAMIN B12: Vitamin B-12: 310 pg/mL (ref 211–911)

## 2021-12-31 MED ORDER — DONEPEZIL HCL 10 MG PO TABS: ORAL_TABLET | ORAL | 11 refills | Status: DC

## 2021-12-31 NOTE — Patient Instructions (Addendum)
It was a pleasure to see you today at our office.   Recommendations: MRI of the brain, the radiology office will call you to arrange you appointment Check labs today Follow up in 1 month  Whom to call:  Memory  decline, memory medications: Call our office [phone]   For psychiatric meds, mood meds: Please have your primary care physician manage these medications.   Counseling regarding caregiver distress, including caregiver depression, anxiety and issues regarding community resources, adult day care programs, adult living facilities, or memory care questions:   Feel free to contact [facility], Social Worker at [phone]   For assessment of decision of mental capacity and competency:  Call Dr. Anthoney Harada, geriatric psychiatrist at [phone]  For guidance in geriatric dementia issues please call [facility] [phone]  For guidance regarding [facility] and if placement were needed at the facility, contact Arnell Asal, Social Worker tel: [phone]  If you have any severe symptoms of a stroke, or other severe issues such as confusion,severe chills or fever, etc call 911 or go to the ER as you may need to be evaluated further   Feel free to visit Facebook page " [email or web address]" for tips of how to care for people with memory problems.     RECOMMENDATIONS FOR ALL PATIENTS WITH MEMORY PROBLEMS: 1. Continue to exercise (Recommend 30 minutes of walking everyday, or 3 hours every week) 2. Increase social interactions - continue going to [place] and enjoy social gatherings with friends and family 3. Eat healthy, avoid fried foods and eat more fruits and vegetables 4. Maintain adequate blood pressure, blood sugar, and blood cholesterol level. Reducing the risk of stroke and cardiovascular disease also helps promoting better memory. 5. Avoid stressful situations. Live a simple life and avoid aggravations. Organize your time and prepare for  the next day in anticipation. 6. Sleep well, avoid any interruptions of sleep and avoid any distractions in the bedroom that may interfere with adequate sleep quality 7. Avoid sugar, avoid sweets as there is a strong link between excessive sugar intake, diabetes, and cognitive impairment We discussed the Mediterranean diet, which has been shown to help patients reduce the risk of progressive memory disorders and reduces cardiovascular risk. This includes eating fish, eat fruits and green leafy vegetables, nuts like almonds and hazelnuts, walnuts, and also use olive oil. Avoid fast foods and fried foods as much as possible. Avoid sweets and sugar as sugar use has been linked to worsening of memory function.  There is always a concern of gradual progression of memory problems. If this is the case, then we may need to adjust level of care according to patient needs. Support, both to the patient and caregiver, should then be put into place.     FALL PRECAUTIONS: Be cautious when walking. Scan the area for obstacles that may increase the risk of trips and falls. When getting up in the mornings, sit up at the edge of the bed for a few minutes before getting out of bed. Consider elevating the bed at the head end to avoid drop of blood pressure when getting up. Walk always in a well-lit room (use night lights in the walls). Avoid area rugs or power cords from appliances in the middle of the walkways. Use a walker or a cane if necessary and consider physical therapy for balance exercise. Get your eyesight checked regularly.  FINANCIAL OVERSIGHT: Supervision, especially oversight when making financial decisions or transactions is also recommended.  HOME SAFETY: Consider the safety of the kitchen when operating appliances like stoves, microwave oven, and blender. Consider having supervision and share cooking responsibilities until no longer able to participate in those. Accidents with firearms and other hazards in  the house should be identified and addressed as well.   ABILITY TO BE LEFT ALONE: If patient is unable to contact 911 operator, consider using LifeLine, or when the need is there, arrange for someone to stay with patients. Smoking is a fire hazard, consider supervision or cessation. Risk of wandering should be assessed by caregiver and if detected at any point, supervision and safe proof recommendations should be instituted.  MEDICATION SUPERVISION: Inability to self-administer medication needs to be constantly addressed. Implement a mechanism to ensure safe administration of the medications.   DRIVING: Regarding driving, in patients with progressive memory problems, driving will be impaired. We advise to have someone else do the driving if trouble finding directions or if minor accidents are reported. Independent driving assessment is available to determine safety of driving.   If you are interested in the driving assessment, you can contact the following:  [employer] in [place]  [place] [phone] or [phone]    [place]orth refers to food and lifestyle choices that are based on the traditions of countries located on the [employer]. This way of eating has been shown to help prevent certain conditions and improve outcomes for people who have chronic diseases, like kidney disease and heart disease. What are tips for following this plan? Lifestyle  Cook and eat meals together with your family, when possible. Drink enough fluid to keep your urine clear or pale yellow. Be physically active every day. This includes: Aerobic exercise like running or swimming. Leisure activities like gardening, walking, or housework. Get 7-8 hours of sleep each night. If recommended by your health care provider, drink red wine in moderation. This  means 1 glass a day for nonpregnant women and 2 glasses a day for men. A glass of wine equals 5 oz (150 mL). Reading food labels  Check the serving size of packaged foods. For foods such as rice and pasta, the serving size refers to the amount of cooked product, not dry. Check the total fat in packaged foods. Avoid foods that have saturated fat or trans fats. Check the ingredients list for added sugars, such as corn syrup. Shopping  At the grocery store, buy most of your food from the areas near the walls of the store. This includes: Fresh fruits and vegetables (produce). Grains, beans, nuts, and seeds. Some of these may be available in unpackaged forms or large amounts (in bulk). Fresh seafood. Poultry and eggs. Low-fat dairy products. Buy whole ingredients instead of prepackaged foods. Buy fresh fruits and vegetables in-season from local farmers markets. Buy frozen fruits and vegetables in resealable bags. If you do not have access to quality fresh seafood, buy precooked frozen shrimp or canned fish, such as tuna, salmon, or sardines. Buy small amounts of raw or cooked vegetables, salads, or olives from the deli or salad bar at your store. Stock your pantry so you always have certain foods on hand, such as olive oil, canned tuna, canned tomatoes, rice, pasta, and beans. Cooking  Cook foods with extra-virgin olive oil instead of using butter or other vegetable oils. Have meat as a side dish, and have vegetables or grains as your main dish. This means  having meat in small portions or adding small amounts of meat to foods like pasta or stew. Use beans or vegetables instead of meat in common dishes like chili or lasagna. Experiment with different cooking methods. Try roasting or broiling vegetables instead of steaming or sauteing them. Add frozen vegetables to soups, stews, pasta, or rice. Add nuts or seeds for added healthy fat at each meal. You can add these to yogurt, salads, or vegetable  dishes. Marinate fish or vegetables using olive oil, lemon juice, garlic, and fresh herbs. Meal planning  Plan to eat 1 vegetarian meal one day each week. Try to work up to 2 vegetarian meals, if possible. Eat seafood 2 or more times a week. Have healthy snacks readily available, such as: Vegetable sticks with hummus. Greek yogurt. Fruit and nut trail mix. Eat balanced meals throughout the week. This includes: Fruit: 2-3 servings a day Vegetables: 4-5 servings a day Low-fat dairy: 2 servings a day Fish, poultry, or lean meat: 1 serving a day Beans and legumes: 2 or more servings a week Nuts and seeds: 1-2 servings a day Whole grains: 6-8 servings a day Extra-virgin olive oil: 3-4 servings a day Limit red meat and sweets to only a few servings a month What are my food choices? Mediterranean diet Recommended Grains: Whole-grain pasta. Brown rice. Bulgar wheat. Polenta. Couscous. Whole-wheat bread. [place]. Vegetables: Artichokes. Beets. Broccoli. Cabbage. Carrots. Eggplant. Green beans. Chard. Kale. Spinach. Onions. Leeks. Peas. Squash. Tomatoes. Peppers. Radishes. Fruits: Apples. Apricots. Avocado. Berries. Bananas. Cherries. Dates. Figs. Grapes. Lemons. Melon. Oranges. Peaches. Plums. Pomegranate. Meats and other protein foods: Beans. Almonds. Sunflower seeds. Pine nuts. Peanuts. [place]. Salmon. Scallops. Shrimp. [place]. Tilapia. Clams. Oysters. Eggs. Dairy: Low-fat milk. Cheese. Greek yogurt. Beverages: Water. Red wine. Herbal tea. Fats and oils: Extra virgin olive oil. Avocado oil. Grape seed oil. Sweets and desserts: [place] yogurt with honey. Baked apples. Poached pears. Trail mix. Seasoning and other foods: Basil. Cilantro. Coriander. Cumin. Mint. Parsley. Sage. Rosemary. Tarragon. Garlic. Oregano. Thyme. Pepper. Balsalmic vinegar. Tahini. Hummus. Tomato sauce. Olives. Mushrooms. Limit these Grains: Prepackaged pasta or rice dishes. Prepackaged cereal with added  sugar. Vegetables: Deep fried potatoes (french fries). Fruits: Fruit canned in syrup. Meats and other protein foods: Beef. Pork. Lamb. Poultry with skin. Hot dogs. Berniece Salines. Dairy: Ice cream. Sour cream. Whole milk. Beverages: Juice. Sugar-sweetened soft drinks. Beer. Liquor and spirits. Fats and oils: Butter. Canola oil. Vegetable oil. Beef fat (tallow). Lard. Sweets and desserts: Cookies. Cakes. Pies. Candy. Seasoning and other foods: Mayonnaise. Premade sauces and marinades. The items listed may not be a complete list. Talk with your dietitian about what dietary choices are right for you. Summary The Mediterranean diet includes both food and lifestyle choices. Eat a variety of fresh fruits and vegetables, beans, nuts, seeds, and whole grains. Limit the amount of red meat and sweets that you eat. Talk with your health care provider about whether it is safe for you to drink red wine in moderation. This means 1 glass a day for nonpregnant women and 2 glasses a day for men. A glass of wine equals 5 oz (150 mL). This information is not intended to replace advice given to you by your health care provider. Make sure you discuss any questions you have with your health care provider. Document Released: 02/13/2016 Document Revised: 03/17/2016 Document Reviewed: 02/13/2016 Elsevier Interactive Patient Education  2017 [employer].

## 2021-12-31 NOTE — Progress Notes (Signed)
Assessment/Plan:    The patient is seen in neurologic consultation at the request of Leotis Shames* for the evaluation of memory.  Perry Iha. is a very pleasant 81 y.o. year old RH male with  a history of hypertension, hyperlipidemia, IBS, vitamin B12 deficiency remote history of lymphoma, insomnia, hypothyroidism seen today for evaluation of memory loss. MoCA today is 10/30, with delayed recall 1/5, visuospatial executive 0/5.  Findings are suspicious for Alzheimer's disease.  Recommendations:   Dementia due to Alzheimer's disease  MRI brain with/without contrast to assess for underlying structural abnormality and assess vascular load  Start donepezil 10 mg, take half tablet for 2 weeks, then increase to 1 tablet daily.  Side effects discussed Check B12 Folllow up  in 1 month   Subjective:    The patient is accompanied by his wife who supplements the history.  How long did patient have memory difficulties? 2 years ago he began repeating himself.  Then he began having problems with short-term memory.  He also began forgetting the names of his close friends.  The other day he went to a funeral, and along his way home he has his wife "who died?  ".  Sometimes he asked his wife who lives with them?. Patient lives with: Spouse   repeats oneself? Endorsed, "he is asking the same questions 1 minute apart "-wife says. Disoriented when walking into a room?  A few months ago, he was concerned because he was not at home (while he was).  He was looking for his house in [place] (he never had a house there).  Leaving objects in unusual places?  Patient denies   Ambulates  with difficulty?  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  Patient denies   Any head injuries?  Patient denies   History of seizures?   Patient denies   Wandering behavior?  Patient denies   Patient drives?  The patient was a Scientist, research (physical sciences),  and continues to drive around town without any issues.  He denies getting lost.  Any mood changes. He is more argumentative than before .  Any history of depression?:  Patient denies   Hallucinations?  Patient denies  Paranoia?  Patient denies   Patient reports that he sleeps well without vivid dreams, REM behavior or sleepwalking    History of sleep apnea?  Patient denies   Any hygiene concerns?  Patient denies   Independent of bathing and dressing?  Endorsed  Does the patient needs help with medications? Wife in charge  Who is in charge of the finances?  Patient is in charge  Any changes in appetite?  Patient denies   Patient have trouble swallowing? Patient denies   Does the patient cook?  Patient denies   Any kitchen accidents such as leaving the stove on? Patient denies   Any headaches?  Patient denies   The double vision? Patient denies   Any focal numbness or tingling?  Patient denies   Chronic back pain Patient denies   Unilateral weakness?  He has a history of chronic right foot drop, but no other areas of numbness or weakness. Any tremors?  R hand minimal tremor  Any history of anosmia?  Patient denies   Any incontinence of urine?  Patient denies   Any bowel dysfunction?   He has a history of IBS, occasionally he has constipation and diarrhea. History of heavy alcohol  intake?  Patient denies   History of heavy tobacco use?  Patient denies   Family history of dementia?  Strong family history of Alzheimer's disease in father, sister and brother.    Allergies  Allergen Reactions   Antihistamines, Diphenhydramine-Type     CAUSES PROSTATE TO SWELL & as a child had hyperactivity when took Benadryl Pt took Pepcid w/ Benadryl PO and did fine per Pt & his wife    Current Outpatient Medications  Medication Instructions   amitriptyline (ELAVIL) 50 mg, Oral, Daily at bedtime   cyanocobalamin (,VITAMIN B-12,) 1000 MCG/ML injection Inject 53m in deltoid once weekly for 4 weeks, then  inject 1 ml once a month thereafter   HYDROcodone-acetaminophen (NORCO) 5-325 MG tablet 1 tablet, Oral, 2 times daily PRN   HYDROcodone-acetaminophen (NORCO/VICODIN) 5-325 MG tablet 1 tablet, Oral, 2 times daily PRN   HYDROcodone-acetaminophen (NORCO/VICODIN) 5-325 MG tablet 1 tablet, Oral, 2 times daily PRN   levothyroxine (SYNTHROID) 25 MCG tablet TAKE 1 TABLET(25 MCG) BY MOUTH DAILY BEFORE BREAKFAST   LORazepam (ATIVAN) 0.5 MG tablet TAKE 1 TABLET(0.5 MG) BY MOUTH TWICE DAILY AS NEEDED FOR ANXIETY   oxymetazoline (AFRIN) 0.05 % nasal spray 1 spray, Each Nare, 2 times daily PRN   Polyethyl Glycol-Propyl Glycol (SYSTANE OP) 1 drop, Ophthalmic, Daily PRN   Polyethylene Glycol 3350 (MIRALAX PO) 17 g, Oral, Daily PRN   SYRINGE-NEEDLE, DISP, 3 ML (BD SAFETYGLIDE SYRINGE/NEEDLE) 25G X 1" 3 ML MISC Use for B12 injections   vardenafil (LEVITRA) 20 mg, Oral, Daily PRN     VITALS:   Vitals:   12/31/21 1328  BP: 109/76  Pulse: 96  Resp: 18  SpO2: 96%  Weight: 165 lb (74.8 kg)  Height: 6' (1.829 m)     PHYSICAL EXAM   HEENT:  Normocephalic, atraumatic. The mucous membranes are moist. The superficial temporal arteries are without ropiness or tenderness. Cardiovascular: Regular rate and rhythm. Lungs: Clear to auscultation bilaterally. Neck: There are no carotid bruits noted bilaterally.  NEUROLOGICAL:    10/14/2015    1:06 PM  Montreal Cognitive Assessment   Visuospatial/ Executive (0/5) 2  Naming (0/3) 3  Attention: Read list of digits (0/2) 2  Attention: Read list of letters (0/1) 1  Attention: Serial 7 subtraction starting at 100 (0/3) 3  Language: Repeat phrase (0/2) 2  Language : Fluency (0/1) 1  Abstraction (0/2) 2  Delayed Recall (0/5) 0  Orientation (0/6) 5  Total 21  Adjusted Score (based on education) 21       12/11/2021    3:09 PM  MMSE - Mini Mental State Exam  Orientation to time 0  Orientation to Place 5  Registration 3  Attention/ Calculation 2  Recall 0   Language- name 2 objects 2  Language- repeat 1  Language- follow 3 step command 3  Language- read & follow direction 1  Write a sentence 1  Copy design 0  Total score 18     Orientation:  Alert and oriented to person, place and not to unable to tell the month or the day, it is 2022.  Time. No aphasia or dysarthria. Fund of knowledge is appropriate. Recent memory impaired and remote memory normal attention and concentration are normal.  Able to name objects and repeat phrases 1/2. Delayed recall 1/5 Cranial nerves: There is good facial symmetry. Extraocular muscles are intact and visual fields are full to confrontational testing. Speech is fluent and clear. Soft palate rises symmetrically and there is no tongue  deviation. Hearing is intact to conversational tone. Tone: Tone is good throughout. Sensation: Sensation is intact to light touch and pinprick throughout. Vibration is intact at the bilateral big toe.There is no extinction with double simultaneous stimulation. There is no sensory dermatomal level identified. Coordination: The patient has no difficulty with RAM's or FNF bilaterally. Normal finger to nose  Motor: Strength is 5/5 in the bilateral upper and lower extremities, except for right foot with drop (chronic) there is no pronator drift. There are no fasciculations noted. DTR's: Deep tendon reflexes are 2/4 at the bilateral biceps, triceps, brachioradialis, patella and achilles.  Plantar responses are downgoing bilaterally. Gait and Station: The patient is able to ambulate with some difficulty due to right foot drop.  The patient is unable to heel toe walk without any difficulty.The patient is able to ambulate in a tandem fashion. The patient is able to stand in the Romberg position.     Thank you for allowing [place] the opportunity to participate in the care of this nice patient. Please do not hesitate to contact us for any questions or concerns.   Total time spent on today's visit was  50 minutes dedicated to this patient today, preparing to see patient, examining the patient, ordering tests and/or medications and counseling the patient, documenting clinical information in the EHR or other health record, independently interpreting results and communicating results to the patient/family, discussing treatment and goals, answering patient's questions and coordinating care.  Cc:  Isaac Bliss, Rayford Halsted, MD  Sharene Butters 12/31/2021 2:09 PM

## 2022-01-02 DIAGNOSIS — F0280 Dementia in other diseases classified elsewhere without behavioral disturbance: Secondary | ICD-10-CM | POA: Insufficient documentation

## 2022-01-12 ENCOUNTER — Telehealth: Payer: Self-pay | Admitting: Internal Medicine

## 2022-01-12 NOTE — Telephone Encounter (Signed)
Wife states that the patient is "getting lightheaded" after taking the medication.

## 2022-01-12 NOTE — Telephone Encounter (Signed)
[name] wife called to say she thinks the dosage on the below mentioned medication may need to be lowered    amitriptyline (ELAVIL) 50 MG tablet  Please advise  [facility] [place], [address] AT [address] [place] Phone:  [phone]  Fax:  [phone]

## 2022-01-13 MED ORDER — AMITRIPTYLINE HCL 25 MG PO TABS: 25.0000 mg | ORAL_TABLET | Freq: Every day | ORAL | 1 refills | Status: DC

## 2022-01-13 NOTE — Addendum Note (Signed)
Addended by: Westley Hummer B on: 01/13/2022 11:02 AM   Modules accepted: Orders

## 2022-01-13 NOTE — Telephone Encounter (Signed)
Left message on machine with Dr Ledell Noss recommendation.

## 2022-01-13 NOTE — Telephone Encounter (Signed)
Pt wife is calling back and due to coating on pill she is unable to cut in half. Please advise

## 2022-01-13 NOTE — Telephone Encounter (Signed)
Rx sent to the pharmacy.

## 2022-01-16 ENCOUNTER — Ambulatory Visit
Admission: RE | Admit: 2022-01-16 | Discharge: 2022-01-16 | Disposition: A | Discharge status: Home / Self Care | Payer: Medicare Other | Source: Ambulatory Visit | Attending: Physician Assistant | Admitting: Physician Assistant

## 2022-01-16 DIAGNOSIS — G319 Degenerative disease of nervous system, unspecified: Secondary | ICD-10-CM | POA: Diagnosis not present

## 2022-01-16 DIAGNOSIS — I639 Cerebral infarction, unspecified: Secondary | ICD-10-CM | POA: Diagnosis not present

## 2022-01-16 DIAGNOSIS — R413 Other amnesia: Secondary | ICD-10-CM

## 2022-01-16 DIAGNOSIS — Q283 Other malformations of cerebral vessels: Secondary | ICD-10-CM | POA: Diagnosis not present

## 2022-01-29 ENCOUNTER — Other Ambulatory Visit: Payer: Self-pay | Admitting: Internal Medicine

## 2022-01-29 DIAGNOSIS — M21371 Foot drop, right foot: Secondary | ICD-10-CM

## 2022-01-29 DIAGNOSIS — F411 Generalized anxiety disorder: Secondary | ICD-10-CM

## 2022-02-02 ENCOUNTER — Ambulatory Visit: Payer: Medicare Other | Admitting: Physician Assistant

## 2022-02-02 ENCOUNTER — Encounter: Payer: Self-pay | Admitting: Physician Assistant

## 2022-02-02 VITALS — Resp 20 | Ht 72.0 in | Wt 159.0 lb

## 2022-02-02 DIAGNOSIS — F01A0 Vascular dementia, mild, without behavioral disturbance, psychotic disturbance, mood disturbance, and anxiety: Secondary | ICD-10-CM

## 2022-02-02 DIAGNOSIS — G309 Alzheimer's disease, unspecified: Secondary | ICD-10-CM | POA: Diagnosis not present

## 2022-02-02 DIAGNOSIS — F0280 Dementia in other diseases classified elsewhere without behavioral disturbance: Secondary | ICD-10-CM

## 2022-02-02 NOTE — Patient Instructions (Signed)
It was a pleasure to see you today at our office.   Recommendations:  Follow up in 6 month Continue taking donepezil 10 mg daily Good control of the cardiovascular risks factors  Baby aspirin daily   Whom to call:  Memory  decline, memory medications: Call our office [phone]   For psychiatric meds, mood meds: Please have your primary care physician manage these medications.   Counseling regarding caregiver distress, including caregiver depression, anxiety and issues regarding community resources, adult day care programs, adult living facilities, or memory care questions:   Feel free to contact [facility], Social Worker at [phone]   For assessment of decision of mental capacity and competency:  Call Dr. Anthoney Harada, geriatric psychiatrist at [phone]  For guidance in geriatric dementia issues please call [facility] [phone]  For guidance regarding [facility] and if placement were needed at the facility, contact Arnell Asal, Social Worker tel: [phone]  If you have any severe symptoms of a stroke, or other severe issues such as confusion,severe chills or fever, etc call 911 or go to the ER as you may need to be evaluated further      RECOMMENDATIONS FOR ALL PATIENTS WITH MEMORY PROBLEMS: 1. Continue to exercise (Recommend 30 minutes of walking everyday, or 3 hours every week) 2. Increase social interactions - continue going to [place] and enjoy social gatherings with friends and family 3. Eat healthy, avoid fried foods and eat more fruits and vegetables 4. Maintain adequate blood pressure, blood sugar, and blood cholesterol level. Reducing the risk of stroke and cardiovascular disease also helps promoting better memory. 5. Avoid stressful situations. Live a simple life and avoid aggravations. Organize your time and prepare for the next day in anticipation. 6. Sleep well, avoid any interruptions of sleep and avoid  any distractions in the bedroom that may interfere with adequate sleep quality 7. Avoid sugar, avoid sweets as there is a strong link between excessive sugar intake, diabetes, and cognitive impairment We discussed the Mediterranean diet, which has been shown to help patients reduce the risk of progressive memory disorders and reduces cardiovascular risk. This includes eating fish, eat fruits and green leafy vegetables, nuts like almonds and hazelnuts, walnuts, and also use olive oil. Avoid fast foods and fried foods as much as possible. Avoid sweets and sugar as sugar use has been linked to worsening of memory function.  There is always a concern of gradual progression of memory problems. If this is the case, then we may need to adjust level of care according to patient needs. Support, both to the patient and caregiver, should then be put into place.     FALL PRECAUTIONS: Be cautious when walking. Scan the area for obstacles that may increase the risk of trips and falls. When getting up in the mornings, sit up at the edge of the bed for a few minutes before getting out of bed. Consider elevating the bed at the head end to avoid drop of blood pressure when getting up. Walk always in a well-lit room (use night lights in the walls). Avoid area rugs or power cords from appliances in the middle of the walkways. Use a walker or a cane if necessary and consider physical therapy for balance exercise. Get your eyesight checked regularly.  FINANCIAL OVERSIGHT: Supervision, especially oversight when making financial decisions or transactions is also recommended.  HOME SAFETY: Consider the safety of the kitchen when operating appliances like stoves, microwave oven, and blender. Consider  having supervision and share cooking responsibilities until no longer able to participate in those. Accidents with firearms and other hazards in the house should be identified and addressed as well.   ABILITY TO BE LEFT ALONE: If  patient is unable to contact 911 operator, consider using LifeLine, or when the need is there, arrange for someone to stay with patients. Smoking is a fire hazard, consider supervision or cessation. Risk of wandering should be assessed by caregiver and if detected at any point, supervision and safe proof recommendations should be instituted.  MEDICATION SUPERVISION: Inability to self-administer medication needs to be constantly addressed. Implement a mechanism to ensure safe administration of the medications.   DRIVING: Regarding driving, in patients with progressive memory problems, driving will be impaired. We advise to have someone else do the driving if trouble finding directions or if minor accidents are reported. Independent driving assessment is available to determine safety of driving.   If you are interested in the driving assessment, you can contact the following:  [employer] in [place]  [place] [phone] or [phone]    [place] refers to food and lifestyle choices that are based on the traditions of countries located on the [employer]. This way of eating has been shown to help prevent certain conditions and improve outcomes for people who have chronic diseases, like kidney disease and heart disease. What are tips for following this plan? Lifestyle  Cook and eat meals together with your family, when possible. Drink enough fluid to keep your urine clear or pale yellow. Be physically active every day. This includes: Aerobic exercise like running or swimming. Leisure activities like gardening, walking, or housework. Get 7-8 hours of sleep each night. If recommended by your health care provider, drink red wine in moderation. This means 1 glass a day for nonpregnant women and 2 glasses a day for men. A glass of wine  equals 5 oz (150 mL). Reading food labels  Check the serving size of packaged foods. For foods such as rice and pasta, the serving size refers to the amount of cooked product, not dry. Check the total fat in packaged foods. Avoid foods that have saturated fat or trans fats. Check the ingredients list for added sugars, such as corn syrup. Shopping  At the grocery store, buy most of your food from the areas near the walls of the store. This includes: Fresh fruits and vegetables (produce). Grains, beans, nuts, and seeds. Some of these may be available in unpackaged forms or large amounts (in bulk). Fresh seafood. Poultry and eggs. Low-fat dairy products. Buy whole ingredients instead of prepackaged foods. Buy fresh fruits and vegetables in-season from local farmers markets. Buy frozen fruits and vegetables in resealable bags. If you do not have access to quality fresh seafood, buy precooked frozen shrimp or canned fish, such as tuna, salmon, or sardines. Buy small amounts of raw or cooked vegetables, salads, or olives from the deli or salad bar at your store. Stock your pantry so you always have certain foods on hand, such as olive oil, canned tuna, canned tomatoes, rice, pasta, and beans. Cooking  Cook foods with extra-virgin olive oil instead of using butter or other vegetable oils. Have meat as a side dish, and have vegetables or grains as your main dish. This means having meat in small portions or adding small amounts of meat to foods like pasta or stew. Use  beans or vegetables instead of meat in common dishes like chili or lasagna. Experiment with different cooking methods. Try roasting or broiling vegetables instead of steaming or sauteing them. Add frozen vegetables to soups, stews, pasta, or rice. Add nuts or seeds for added healthy fat at each meal. You can add these to yogurt, salads, or vegetable dishes. Marinate fish or vegetables using olive oil, lemon juice, garlic, and fresh  herbs. Meal planning  Plan to eat 1 vegetarian meal one day each week. Try to work up to 2 vegetarian meals, if possible. Eat seafood 2 or more times a week. Have healthy snacks readily available, such as: Vegetable sticks with hummus. Greek yogurt. Fruit and nut trail mix. Eat balanced meals throughout the week. This includes: Fruit: 2-3 servings a day Vegetables: 4-5 servings a day Low-fat dairy: 2 servings a day Fish, poultry, or lean meat: 1 serving a day Beans and legumes: 2 or more servings a week Nuts and seeds: 1-2 servings a day Whole grains: 6-8 servings a day Extra-virgin olive oil: 3-4 servings a day Limit red meat and sweets to only a few servings a month What are my food choices? Mediterranean diet Recommended Grains: Whole-grain pasta. Brown rice. Bulgar wheat. Polenta. Couscous. Whole-wheat bread. [place]. Vegetables: Artichokes. Beets. Broccoli. Cabbage. Carrots. Eggplant. Green beans. Chard. Kale. Spinach. Onions. Leeks. Peas. Squash. Tomatoes. Peppers. Radishes. Fruits: Apples. Apricots. Avocado. Berries. Bananas. Cherries. Dates. Figs. Grapes. Lemons. Melon. Oranges. Peaches. Plums. Pomegranate. Meats and other protein foods: Beans. Almonds. Sunflower seeds. Pine nuts. Peanuts. [place]. Salmon. Scallops. Shrimp. [place]. Tilapia. Clams. Oysters. Eggs. Dairy: Low-fat milk. Cheese. Greek yogurt. Beverages: Water. Red wine. Herbal tea. Fats and oils: Extra virgin olive oil. Avocado oil. Grape seed oil. Sweets and desserts: [place] yogurt with honey. Baked apples. Poached pears. Trail mix. Seasoning and other foods: Basil. Cilantro. Coriander. Cumin. Mint. Parsley. Sage. Rosemary. Tarragon. Garlic. Oregano. Thyme. Pepper. Balsalmic vinegar. Tahini. Hummus. Tomato sauce. Olives. Mushrooms. Limit these Grains: Prepackaged pasta or rice dishes. Prepackaged cereal with added sugar. Vegetables: Deep fried potatoes (french fries). Fruits: Fruit canned in syrup. Meats and  other protein foods: Beef. Pork. Lamb. Poultry with skin. Hot dogs. Bernie[place] Salines. Dairy: Ice cream. Sour cream. Whole milk. Beverages: Juice. Sugar-sweetened soft drinks. Beer. Liquor and spirits. Fats and oils: Butter. Canola oil. Vegetable oil. Beef fat (tallow). Lard. Sweets and desserts: Cookies. Cakes. Pies. Candy. Seasoning and other foods: Mayonnaise. Premade sauces and marinades. The items listed may not be a complete list. Talk with your dietitian about what dietary choices are right for you. Summary The Mediterranean diet includes both food and lifestyle choices. Eat a variety of fresh fruits and vegetables, beans, nuts, seeds, and whole grains. Limit the amount of red meat and sweets that you eat. Talk with your health care provider about whether it is safe for you to drink red wine in moderation. This means 1 glass a day for nonpregnant women and 2 glasses a day for men. A glass of wine equals 5 oz (150 mL). This information is not intended to replace advice given to you by your health care provider. Make sure you discuss any questions you have with your health care provider. Document Released: 02/13/2016 Document Revised: 03/17/2016 Document Reviewed: 02/13/2016 Elsevier Interactive Patient Education  2017 [employer].

## 2022-02-02 NOTE — Progress Notes (Signed)
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 is a very pleasant 81 y.o. RH male with a history of hypertension, hyperlipidemia, IBS, vitamin B12 deficiency, remote history of lymphoma, insomnia, hypothyroidism seen today in follow up for memory loss.  Last MoCA on 12/31/2021 was 10/30.  MRI of the brain without evidence of acute intracranial abnormality, but mild chronic small vessel ischemic disease has slightly progressed from prior MRI in 2017.  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  Patient is currently on donepezil 10 mg daily, tolerating well.   Recommendations:    Continue  10 mg   Side effects were discussed Recommend good control of cardiovascular risk factors Recommend resume baby aspirin daily. Follow-up in 62-month  Case discussed with Dr. ADelice Leschwho agrees with the plan   Subjective:    This patient is accompanied in the office by his wife who supplements the history.  Previous records as well as any outside records available were reviewed prior to todays visit.Patient was last seen at our office on 12/31/2021 at which time his MoCA was 10/30    Any changes in memory since last visit?  "Memory is better with Aricept. More clear thought.  ".  He is resuming word finding and crossword puzzles Patient lives with: Wife repeats oneself?  Endorsed Disoriented when walking into a room?  Patient denies   Leaving objects in unusual places?  Patient denies   Ambulates  with difficulty?  He has chronic right foot drop since 2016 recent falls?  Patient denies   Any head injuries?  Patient denies   History of seizures?   Patient denies   Wandering behavior?  Patient denies   Patient drives?   Patient no longer drives  Any mood changes?  Denies Any history of depression?:  Patient denies   Hallucinations?  Patient  denies   Paranoia?  Patient denies   Patient reports that he sleeps well without vivid dreams, REM behavior or sleepwalking   History of sleep apnea?  Patient denies   Any hygiene concerns?  Patient denies   Independent of bathing and dressing?  Endorsed  Does the patient needs help with medications?  Denies Who is in charge of the finances?   is in charge    Any changes in appetite?  Patient denies   Patient have trouble swallowing? Patient denies   Does the patient cook?  Patient denies   Any kitchen accidents such as leaving the stove on? Patient denies   Any headaches?  Patient denies   Double vision? Patient denies   Any focal numbness or tingling?  Patient denies   Chronic back pain Patient denies   Unilateral weakness?  Patient denies   Any tremors?  Patient denies   Any history of anosmia?  Patient denies   Any incontinence of urine?  Patient denies   Any bowel dysfunction?   He has a history of IBS so sometimes he has diarrhea and sometimes constipation  Initial visit 12/31/2021   How long did patient have memory difficulties? 2 years ago he began repeating himself.  Then he began having problems with short-term memory.  He also began forgetting the names of his close friends.  The other day he went to a funeral, and along his way home he has his wife "who died?  ".  Sometimes he asked his wife who lives with  them?. Patient lives with: Spouse   repeats oneself? Endorsed, "he is asking the same questions 1 minute apart "-wife says. Disoriented when walking into a room?  A few months ago, he was concerned because he was not at home (while he was).  He was looking for his house in [place] (he never had a house there).  Leaving objects in unusual places?  Patient denies   Ambulates  with difficulty?  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  Patient denies   Any head injuries?  Patient denies   History of seizures?    Patient denies   Wandering behavior?  Patient denies   Patient drives?  The patient was a Scientist, research (physical sciences), and continues to drive around town without any issues.  He denies getting lost.  Any mood changes. He is more argumentative than before .  Any history of depression?:  Patient denies   Hallucinations?  Patient denies  Paranoia?  Patient denies   Patient reports that he sleeps well without vivid dreams, REM behavior or sleepwalking    History of sleep apnea?  Patient denies   Any hygiene concerns?  Patient denies   Independent of bathing and dressing?  Endorsed  Does the patient needs help with medications? Wife in charge  Who is in charge of the finances?  Patient is in charge  Any changes in appetite?  Patient denies   Patient have trouble swallowing? Patient denies   Does the patient cook?  Patient denies   Any kitchen accidents such as leaving the stove on? Patient denies   Any headaches?  Patient denies   The double vision? Patient denies   Any focal numbness or tingling?  Patient denies   Chronic back pain Patient denies   Unilateral weakness?  He has a history of chronic right foot drop, but no other areas of numbness or weakness. Any tremors?  R hand minimal tremor  Any history of anosmia?  Patient denies   Any incontinence of urine?  Patient denies   Any bowel dysfunction?   He has a history of IBS, occasionally he has constipation and diarrhea. History of heavy alcohol intake?  Patient denies   History of heavy tobacco use?  Patient denies   Family history of dementia?  Strong family history of Alzheimer's disease in father, sister and brother.      PREVIOUS MEDICATIONS:   CURRENT MEDICATIONS:  Outpatient Encounter Medications as of 02/02/2022  Medication Sig   amitriptyline (ELAVIL) 25 MG tablet Take 1 tablet (25 mg total) by mouth at bedtime.   donepezil (ARICEPT) 10 MG tablet Take half tablet (5 mg) daily for 2 weeks, then increase to the full tablet at 10  mg daily   HYDROcodone-acetaminophen (NORCO) 5-325 MG tablet Take 1 tablet by mouth 2 (two) times daily as needed for moderate pain.   HYDROcodone-acetaminophen (NORCO/VICODIN) 5-325 MG tablet Take 1 tablet by mouth 2 (two) times daily as needed for moderate pain.   HYDROcodone-acetaminophen (NORCO/VICODIN) 5-325 MG tablet Take 1 tablet by mouth 2 (two) times daily as needed for moderate pain.   levothyroxine (SYNTHROID) 25 MCG tablet TAKE 1 TABLET(25 MCG) BY MOUTH DAILY BEFORE BREAKFAST   LORazepam (ATIVAN) 0.5 MG tablet TAKE 1 TABLET(0.5 MG) BY MOUTH TWICE DAILY AS NEEDED FOR ANXIETY   oxymetazoline (AFRIN) 0.05 % nasal spray Place 1 spray into both nostrils 2 (two) times daily as needed for congestion.  Polyethyl Glycol-Propyl Glycol (SYSTANE OP) Apply 1 drop to eye daily as needed (dryness).   Polyethylene Glycol 3350 (MIRALAX PO) Take 17 g by mouth daily as needed.   SYRINGE-NEEDLE, DISP, 3 ML (BD SAFETYGLIDE SYRINGE/NEEDLE) 25G X 1" 3 ML MISC Use for B12 injections   vardenafil (LEVITRA) 20 MG tablet Take 1 tablet (20 mg total) by mouth daily as needed for erectile dysfunction.   cyanocobalamin (,VITAMIN B-12,) 1000 MCG/ML injection Inject 31m in deltoid once weekly for 4 weeks, then inject 1 ml once a month thereafter (Patient not taking: Reported on 02/02/2022)   No facility-administered encounter medications on file as of 02/02/2022.       12/11/2021    3:09 PM  MMSE - Mini Mental State Exam  Orientation to time 0  Orientation to Place 5  Registration 3  Attention/ Calculation 2  Recall 0  Language- name 2 objects 2  Language- repeat 1  Language- follow 3 step command 3  Language- read & follow direction 1  Write a sentence 1  Copy design 0  Total score 18      10/14/2015    1:06 PM  Montreal Cognitive Assessment   Visuospatial/ Executive (0/5) 2  Naming (0/3) 3  Attention: Read list of digits (0/2) 2  Attention: Read list of letters (0/1) 1  Attention: Serial 7  subtraction starting at 100 (0/3) 3  Language: Repeat phrase (0/2) 2  Language : Fluency (0/1) 1  Abstraction (0/2) 2  Delayed Recall (0/5) 0  Orientation (0/6) 5  Total 21  Adjusted Score (based on education) 21    Objective:     PHYSICAL EXAMINATION:    VITALS:   Vitals:   02/02/22 1431  Resp: 20  Weight: 159 lb (72.1 kg)  Height: 6' (1.829 m)    GEN:  The patient appears stated age and is in NAD. HEENT:  Normocephalic, atraumatic.   Neurological examination:  General: NAD, well-groomed, appears stated age. Orientation: The patient is alert. Oriented to person, place and not to date. Cranial nerves: There is good facial symmetry.The speech is fluent and clear. No aphasia or dysarthria. Fund of knowledge is appropriate. Recent and remote memory are impaired. Attention and concentration are reduced.  Able to name objects and repeat phrases.  Hearing is intact to conversational tone.    Sensation: Sensation is intact to light touch throughout Motor: Strength is 5/5 in the bilateral and upper lower extremities except for right foot drop (chronic) , no pronator drift.  No fasciculations noted Tremors: none  DTR's 2/4 in UE/LE     Movement examination: Tone: There is normal tone in the UE/LE Abnormal movements:  no tremor.  No myoclonus.  No asterixis.   Coordination:  There is no decremation with RAM's. Normal finger to nose  Gait and Station: The patient is able to ambulate with some difficulty due to right foot drop, unable to heel toe, able to ambulate in a tandem fashion.  Able to stand in the Romberg position.  Thank you for allowing [facility] opportunity to participate in the care of this nice patient. Please do not hesitate to contact [place] any questions or concerns.   Total time spent on today's visit was 30 minutes dedicated to this patient today, preparing to see patient, examining the patient, ordering tests and/or medications and counseling the patient, documenting  clinical information in the EHR or other health record, independently interpreting results and communicating results to the patient/family, discussing treatment and  goals, answering patient's questions and coordinating care.  Cc:  Isaac Bliss, Rayford Halsted, MD  Sharene Butters 02/02/2022 3:24 PM

## 2022-02-09 ENCOUNTER — Other Ambulatory Visit: Payer: Self-pay | Admitting: Internal Medicine

## 2022-02-09 DIAGNOSIS — C8580 Other specified types of non-Hodgkin lymphoma, unspecified site: Secondary | ICD-10-CM

## 2022-02-09 MED ORDER — HYDROCODONE-ACETAMINOPHEN 5-325 MG PO TABS
1.0000 | ORAL_TABLET | Freq: Two times a day (BID) | ORAL | 0 refills | Status: DC | PRN
Start: 2022-02-09 — End: 2022-03-12

## 2022-02-09 NOTE — Telephone Encounter (Signed)
Pt requesting refill of HYDROcodone-acetaminophen (NORCO) 5-325 MG tablet/HYDROcodone-acetaminophen (NORCO/VICODIN) 5-325 MG tablet

## 2022-02-09 NOTE — Telephone Encounter (Signed)
Refill sent.

## 2022-02-09 NOTE — Telephone Encounter (Signed)
Last refill 11/17/21 Last office visit 12/11/21 Next office visit 03/02/22

## 2022-02-25 ENCOUNTER — Other Ambulatory Visit: Payer: Self-pay | Admitting: Internal Medicine

## 2022-02-25 DIAGNOSIS — E039 Hypothyroidism, unspecified: Secondary | ICD-10-CM

## 2022-02-27 ENCOUNTER — Other Ambulatory Visit: Payer: Self-pay

## 2022-02-27 NOTE — Patient Outreach (Signed)
  Care Coordination   02/27/2022 Name: Perry Nielsen. MRN: [id number] DOB: [birth year]   Care Coordination Outreach Attempts:  An unsuccessful telephone outreach was attempted today to offer the patient information about available care coordination services as a benefit of their health plan.   Follow Up Plan:  Additional outreach attempts will be made to offer the patient care coordination information and services.   Encounter Outcome:  No Answer  Care Coordination Interventions Activated:  No   Care Coordination Interventions:  No, not indicated    Peter Garter RN, BSN,CCM, [facility] [phone]

## 2022-02-28 ENCOUNTER — Other Ambulatory Visit: Payer: Self-pay | Admitting: Internal Medicine

## 2022-02-28 DIAGNOSIS — M21371 Foot drop, right foot: Secondary | ICD-10-CM

## 2022-02-28 DIAGNOSIS — F411 Generalized anxiety disorder: Secondary | ICD-10-CM

## 2022-03-02 ENCOUNTER — Ambulatory Visit (INDEPENDENT_AMBULATORY_CARE_PROVIDER_SITE_OTHER): Payer: Medicare Other | Admitting: Internal Medicine

## 2022-03-02 ENCOUNTER — Encounter: Payer: Self-pay | Admitting: Internal Medicine

## 2022-03-02 DIAGNOSIS — M21371 Foot drop, right foot: Secondary | ICD-10-CM | POA: Diagnosis not present

## 2022-03-02 DIAGNOSIS — F411 Generalized anxiety disorder: Secondary | ICD-10-CM

## 2022-03-02 MED ORDER — LORAZEPAM 0.5 MG PO TABS: ORAL_TABLET | ORAL | 2 refills | Status: DC

## 2022-03-02 NOTE — Progress Notes (Signed)
Established Patient Office Visit     CC/Reason for Visit: Medication refill  HPI: Perry Nielsen. is a 81 y.o. male who is coming in today for the above mentioned reasons.  He has a history of anxiety and is on lorazepam.  Is requesting refills today.  Here with his wife.  Since I last saw him he has been seen a few times by neurology diagnosed with dementia and started on medication.  Past Medical/Surgical History: Past Medical History:  Diagnosis Date   ALLERGIC RHINITIS 08/03/2007   BACK PAIN 09/18/2009   Basal cell carcinoma 08/21/2004   right wing nose (MOHS)   BCC (basal cell carcinoma of skin) 06/07/2001   mid upper back   BCC (basal cell carcinoma) 03/08/2013   right forehead (curet & carter)   BPH associated with nocturia 02/18/2007   Qualifier: Diagnosis of  By: Tiney Rouge CMA, [place], LEFT 08/07/2008   ERECTILE DYSFUNCTION, MILD 08/07/2008   Foot drop    rt foot   GERD 02/18/2007   HAMMER TOE 08/03/2007   Hepatitis 1977   no residual problems, type A   KNEE PAIN, LEFT, CHRONIC 08/07/2008   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   Rash, skin    torso   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   right nose (caulery, 21f)   SCC (squamous cell carcinoma) 11/24/2016   left upper arm (curetx3, 534f   SCC (squamous cell carcinoma) 04/20/2017   mid upper forehead  (tx after bx)   SCC (squamous cell carcinoma) 05/11/2019   left scapha   SCC (squamous cell carcinoma) 08/17/2019   mid back   Skin cancer    Sleeping difficulty    Squamous cell carcinoma of skin 07/31/2003   right brow (curetx3, 60f6f   Past Surgical History:  Procedure Laterality Date   CATARACT EXTRACTION W/ INTRAOCULAR LENS  IMPLANT, BILATERAL Bilateral    KNEE ARTHROSCOPY     left   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 He has never used smokeless tobacco. He reports current alcohol use. He reports that he does not use drugs.  Allergies: Allergies  Allergen Reactions   Antihistamines, Diphenhydramine-Type     CAUSES PROSTATE TO SWELL & as a child had hyperactivity when took Benadryl Pt took Pepcid w/ Benadryl PO and did fine per Pt & his wife    Family History:  Family History  Problem Relation Age of Onset   Pulmonary fibrosis Mother    Uterine cancer Mother    Prostate cancer Father    Heart disease Brother    Heart attack Other    Aneurysm Other        aortic   Coronary artery disease Sister      Current Outpatient Medications:    amitriptyline (ELAVIL) 25 MG tablet, Take 1 tablet (25 mg total) by mouth at bedtime., Disp: 90 tablet, Rfl: 1   cyanocobalamin (,VITAMIN B-12,) 1000 MCG/ML injection, Inject 1ml37m deltoid once weekly for 4 weeks, then inject 1 ml once a month thereafter, Disp: 6 mL, Rfl: 3   donepezil (ARICEPT) 10 MG tablet,  Take half tablet (5 mg) daily for 2 weeks, then increase to the full tablet at 10 mg daily, Disp: 30 tablet, Rfl: 11   HYDROcodone-acetaminophen (NORCO) 5-325 MG tablet, Take 1 tablet by mouth 2 (two) times daily as needed for moderate pain., Disp: 60 tablet, Rfl: 0   HYDROcodone-acetaminophen (NORCO/VICODIN) 5-325 MG tablet, Take 1 tablet by mouth 2 (two) times daily as needed for moderate pain., Disp: 60 tablet, Rfl: 0   HYDROcodone-acetaminophen (NORCO/VICODIN) 5-325 MG tablet, Take 1 tablet by mouth 2 (two) times daily as needed for moderate pain., Disp: 60 tablet, Rfl: 0   levothyroxine (SYNTHROID) 25 MCG tablet, TAKE 1 TABLET(25 MCG) BY MOUTH DAILY BEFORE BREAKFAST, Disp: 90 tablet, Rfl: 1   oxymetazoline (AFRIN) 0.05 % nasal spray, Place 1 spray into both nostrils 2 (two) times daily as needed for  congestion., Disp: , Rfl:    Polyethyl Glycol-Propyl Glycol (SYSTANE OP), Apply 1 drop to eye daily as needed (dryness)., Disp: , Rfl:    Polyethylene Glycol 3350 (MIRALAX PO), Take 17 g by mouth daily as needed., Disp: , Rfl:    SYRINGE-NEEDLE, DISP, 3 ML (BD SAFETYGLIDE SYRINGE/NEEDLE) 25G X 1" 3 ML MISC, Use for B12 injections, Disp: 100 each, Rfl: 11   vardenafil (LEVITRA) 20 MG tablet, Take 1 tablet (20 mg total) by mouth daily as needed for erectile dysfunction., Disp: 20 tablet, Rfl: 2   LORazepam (ATIVAN) 0.5 MG tablet, TAKE 1 TABLET(0.5 MG) BY MOUTH TWICE DAILY AS NEEDED FOR ANXIETY, Disp: 60 tablet, Rfl: 2  Review of Systems:  Constitutional: Denies fever, chills, diaphoresis, appetite change and fatigue.  HEENT: Denies photophobia, eye pain, redness, hearing loss, ear pain, congestion, sore throat, rhinorrhea, sneezing, mouth sores, trouble swallowing, neck pain, neck stiffness and tinnitus.   Respiratory: Denies SOB, DOE, cough, chest tightness,  and wheezing.   Cardiovascular: Denies chest pain, palpitations and leg swelling.  Gastrointestinal: Denies nausea, vomiting, abdominal pain, diarrhea, constipation, blood in stool and abdominal distention.  Genitourinary: Denies dysuria, urgency, frequency, hematuria, flank pain and difficulty urinating.  Endocrine: Denies: hot or cold intolerance, sweats, changes in hair or nails, polyuria, polydipsia. Musculoskeletal: Denies myalgias, back pain, joint swelling, arthralgias and gait problem.  Skin: Denies pallor, rash and wound.  Neurological: Denies dizziness, seizures, syncope, weakness, light-headedness, numbness and headaches.  Hematological: Denies adenopathy. Easy bruising, personal or family bleeding history  Psychiatric/Behavioral: Denies suicidal ideation, mood changes, confusion, nervousness, sleep disturbance and agitation    Physical Exam: Vitals:   03/02/22 1513  BP: 120/80  Pulse: 85  Temp: (!) 97.3 F (36.3 C)   TempSrc: Oral  SpO2: 98%  Weight: 161 lb 1.6 oz (73.1 kg)    Body mass index is 21.85 kg/m.   Constitutional: NAD, calm, comfortable Eyes: PERRL, lids and conjunctivae normal, wears corrective lenses ENMT: Mucous membranes are moist.  Respiratory: clear to auscultation bilaterally, no wheezing, no crackles. Normal respiratory effort. No accessory muscle use.  Cardiovascular: Regular rate and rhythm, no murmurs / rubs / gallops. No extremity edema. Marland Kitchen  Psychiatric: Normal judgment and insight. Alert and oriented x 3. Normal mood.    Impression and Plan:  Anxiety state - Plan: LORazepam (ATIVAN) 0.5 MG tablet -PDMP reviewed, refill of lorazepam sent.    Time spent:21 minutes reviewing chart, interviewing and examining patient and formulating plan of care.      Lelon Frohlich, MD [facility] at [facility]

## 2022-03-11 ENCOUNTER — Telehealth: Payer: Self-pay

## 2022-03-11 NOTE — Patient Outreach (Signed)
  Care Coordination   Initial Visit Note   03/11/2022 Name: Perry Nielsen. MRN: [id number] DOB: 1941-03-10  Perry Iha. is a 81 y.o. year old male who sees Isaac Bliss, Rayford Halsted, MD for primary care. I  Spoke with wife Perry Nielsen  What matters to the patients health and wellness today?  No concerns today    Goals Addressed             This Visit's Progress    Care Coordination Activities - no follow up required       Care Coordination Interventions: Advised patient to keep Annual Wellness Visit scheduled 11/02/22 Provided education to patient re: Annual Wellness Visit, care coordination services  Assessed social determinant of health barriers          SDOH assessments and interventions completed:  Yes  SDOH Interventions Today    Flowsheet Row Most Recent Value  SDOH Interventions   Food Insecurity Interventions Intervention Not Indicated  Housing Interventions Intervention Not Indicated  Transportation Interventions Intervention Not Indicated  Utilities Interventions Intervention Not Indicated  Financial Strain Interventions Intervention Not Indicated        Care Coordination Interventions Activated:  Yes  Care Coordination Interventions:  Yes, provided   Follow up plan: No further intervention required.   Encounter Outcome:  Pt. Visit Completed  Peter Garter RN, BSN,CCM, CDE Care Management Coordinator [place] [phone]

## 2022-03-11 NOTE — Patient Instructions (Signed)
Visit Information  Thank you for taking time to visit with me today. Please don't hesitate to contact me if I can be of assistance to you.   Following are the goals we discussed today:   Goals Addressed             This Visit's Progress    Care Coordination Activities - no follow up required       Care Coordination Interventions: Advised patient to keep Annual Wellness Visit scheduled 11/02/22 Provided education to patient re: Annual Wellness Visit, care coordination services  Assessed social determinant of health barriers           If you are experiencing a Mental Health or [place] or need someone to talk to, please call the Suicide and Crisis Lifeline: 988 call the [place] National Suicide Prevention Lifeline: (762) 819-1112 or TTY: 531-466-1124 TTY 812-375-9118) to talk to a trained counselor call 1-800-273-TALK (toll free, 24 hour hotline) go to [facility] [address] [place] [phone]) call 911   The patient verbalized understanding of instructions, educational materials, and care plan provided today and DECLINED offer to receive copy of patient instructions, educational materials, and care plan.   No further follow up required:    Peter Garter RN, Jackquline Denmark, [place] [facility] [phone]

## 2022-03-12 ENCOUNTER — Other Ambulatory Visit: Payer: Self-pay | Admitting: Internal Medicine

## 2022-03-12 DIAGNOSIS — C8580 Other specified types of non-Hodgkin lymphoma, unspecified site: Secondary | ICD-10-CM

## 2022-03-12 MED ORDER — HYDROCODONE-ACETAMINOPHEN 5-325 MG PO TABS: 1.0000 | ORAL_TABLET | Freq: Two times a day (BID) | ORAL | 0 refills | Status: DC | PRN

## 2022-03-12 NOTE — Telephone Encounter (Signed)
Pt wife is calling and pt needs a refill on HYDROcodone-acetaminophen (NORCO) 5-325 MG tablet. Pt wife is aware can take up to 3 business days  [facility], [place] AT [address] Phone:  [phone]  Fax:  [phone]

## 2022-03-12 NOTE — Telephone Encounter (Signed)
Patient had an appointment 03/02/22

## 2022-03-12 NOTE — Telephone Encounter (Signed)
Refill sent.

## 2022-04-10 ENCOUNTER — Telehealth: Payer: Self-pay | Admitting: Internal Medicine

## 2022-04-10 NOTE — Telephone Encounter (Signed)
[name] spouse called to request a refill of the HYDROcodone-acetaminophen (NORCO) 5-325 MG tablet  Spouse was informed MD is off on Fridays.  LOV:  03/02/22  [facility] #[id number] [address], [place] AT [address] Phone:  [phone]  Fax:  [phone]

## 2022-04-13 NOTE — Telephone Encounter (Signed)
Last OV 03/02/22.  Upon review of chart: pt should have refills.   LVM that pt should have refills of the requested medication at the [facility] on [address].

## 2022-05-11 ENCOUNTER — Telehealth: Payer: Self-pay | Admitting: Internal Medicine

## 2022-05-11 NOTE — Telephone Encounter (Signed)
[name] spouse called to request a refill of the: HYDROcodone-acetaminophen ([place]) 5-325 MG tablet   [facility], [address] AT [address] Phone: [phone]  Fax: [phone]      LOV:  03/02/22

## 2022-05-12 NOTE — Telephone Encounter (Signed)
Spoke with pharmacist and the refill will be available today 05/12/22. Left detailed message on machine for patient

## 2022-05-25 ENCOUNTER — Other Ambulatory Visit: Payer: Self-pay | Admitting: Internal Medicine

## 2022-05-25 DIAGNOSIS — F411 Generalized anxiety disorder: Secondary | ICD-10-CM

## 2022-05-25 DIAGNOSIS — M21371 Foot drop, right foot: Secondary | ICD-10-CM

## 2022-06-08 ENCOUNTER — Telehealth: Payer: Self-pay | Admitting: Internal Medicine

## 2022-06-08 NOTE — Telephone Encounter (Signed)
[name] wife called to make an appt for Pt.  MD does not have anything available until 06/15/22.  They are wondering if they can see another provider to get the refill?  Please advise.  LOV:  03/02/22  [facility] #[id number] [address], [address] AT [address] Phone: [phone]  Fax: [phone]

## 2022-06-08 NOTE — Telephone Encounter (Signed)
Pt need a refill on HYDROcodone-acetaminophen (NORCO/VICODIN) 5-325 MG tablet   [facility] #[id number] [address], [address] AT [address] Phone: [phone]  Fax: [phone]

## 2022-06-08 NOTE — Telephone Encounter (Signed)
Thank you :)

## 2022-06-08 NOTE — Telephone Encounter (Signed)
Last filled 05/12/22 Last office visit 03/02/22 Left message on machine for patient to schedule an appointment

## 2022-06-08 NOTE — Telephone Encounter (Signed)
Okay to use Wednesday or Thursday at 4 pm

## 2022-06-11 ENCOUNTER — Ambulatory Visit (INDEPENDENT_AMBULATORY_CARE_PROVIDER_SITE_OTHER): Payer: Medicare Other | Admitting: Internal Medicine

## 2022-06-11 ENCOUNTER — Encounter: Payer: Self-pay | Admitting: Internal Medicine

## 2022-06-11 DIAGNOSIS — C8580 Other specified types of non-Hodgkin lymphoma, unspecified site: Secondary | ICD-10-CM | POA: Diagnosis not present

## 2022-06-11 MED ORDER — HYDROCODONE-ACETAMINOPHEN 5-325 MG PO TABS: 1.0000 | ORAL_TABLET | Freq: Two times a day (BID) | ORAL | 0 refills | Status: DC | PRN

## 2022-06-11 NOTE — Progress Notes (Signed)
Established Patient Office Visit     CC/Reason for Visit: Medication refills  HPI: Perry Nielsen. is a 81 y.o. male who is coming in today for the above mentioned reasons.  He has a history of lymphoma.  In the last 6 months I was asked to take over his narcotic prescription.  He is on hydrocodone 5/325 mg that he takes 1 tablet twice a day and has been doing this for many years.  He is feeling well and has no acute concerns or complaints.  He is still swimming every day.   Past Medical/Surgical History: Past Medical History:  Diagnosis Date   ALLERGIC RHINITIS 08/03/2007   BACK PAIN 09/18/2009   Basal cell carcinoma 08/21/2004   right wing nose (MOHS)   BCC (basal cell carcinoma of skin) 06/07/2001   mid upper back   BCC (basal cell carcinoma) 03/08/2013   right forehead (curet & carter)   BPH associated with nocturia 02/18/2007   Qualifier: Diagnosis of  By: Tiney Rouge CMA, [place], LEFT 08/07/2008   ERECTILE DYSFUNCTION, MILD 08/07/2008   Foot drop    rt foot   GERD 02/18/2007   HAMMER TOE 08/03/2007   Hepatitis 1977   no residual problems, type A   KNEE PAIN, LEFT, CHRONIC 08/07/2008   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   Rash, skin    torso   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   right nose (caulery, 49f)   SCC (squamous cell carcinoma) 11/24/2016   left upper arm (curetx3, 571f   SCC (squamous cell carcinoma) 04/20/2017   mid upper forehead  (tx after bx)   SCC (squamous cell carcinoma) 05/11/2019   left scapha   SCC (squamous cell carcinoma) 08/17/2019   mid back   Skin cancer    Sleeping difficulty    Squamous cell carcinoma of skin 07/31/2003   right brow (curetx3, 66f48f   Past Surgical History:  Procedure Laterality Date   CATARACT EXTRACTION W/ INTRAOCULAR LENS  IMPLANT, BILATERAL Bilateral    KNEE ARTHROSCOPY     left   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 He has never used smokeless tobacco. He reports current alcohol use. He reports that he does not use drugs.  Allergies: Allergies  Allergen Reactions   Antihistamines, Diphenhydramine-Type     CAUSES PROSTATE TO SWELL & as a child had hyperactivity when took Benadryl Pt took Pepcid w/ Benadryl PO and did fine per Pt & his wife    Family History:  Family History  Problem Relation Age of Onset   Pulmonary fibrosis Mother    Uterine cancer Mother    Prostate cancer Father    Heart disease Brother    Heart attack Other    Aneurysm Other        aortic   Coronary artery disease Sister      Current Outpatient Medications:    amitriptyline (ELAVIL) 25 MG tablet, Take 1 tablet (25 mg total) by mouth at bedtime., Disp: 90 tablet, Rfl: 1   donepezil (ARICEPT) 10 MG tablet, Take half tablet (5 mg) daily for 2  weeks, then increase to the full tablet at 10 mg daily, Disp: 30 tablet, Rfl: 11   levothyroxine (SYNTHROID) 25 MCG tablet, TAKE 1 TABLET(25 MCG) BY MOUTH DAILY BEFORE BREAKFAST, Disp: 90 tablet, Rfl: 1   LORazepam (ATIVAN) 0.5 MG tablet, TAKE 1 TABLET(0.5 MG) BY MOUTH TWICE DAILY AS NEEDED FOR ANXIETY, Disp: 60 tablet, Rfl: 1   oxymetazoline (AFRIN) 0.05 % nasal spray, Place 1 spray into both nostrils 2 (two) times daily as needed for congestion., Disp: , Rfl:    Polyethyl Glycol-Propyl Glycol (SYSTANE OP), Apply 1 drop to eye daily as needed (dryness)., Disp: , Rfl:    Polyethylene Glycol 3350 (MIRALAX PO), Take 17 g by mouth daily as needed., Disp: , Rfl:    HYDROcodone-acetaminophen (NORCO) 5-325 MG tablet, Take 1 tablet by mouth 2 (two) times daily as needed for moderate pain., Disp: 60 tablet, Rfl: 0   HYDROcodone-acetaminophen (NORCO/VICODIN)  5-325 MG tablet, Take 1 tablet by mouth 2 (two) times daily as needed for moderate pain., Disp: 60 tablet, Rfl: 0   HYDROcodone-acetaminophen (NORCO/VICODIN) 5-325 MG tablet, Take 1 tablet by mouth 2 (two) times daily as needed for moderate pain., Disp: 60 tablet, Rfl: 0  Review of Systems:  Constitutional: Denies fever, chills, diaphoresis, appetite change and fatigue.  HEENT: Denies photophobia, eye pain, redness, hearing loss, ear pain, congestion, sore throat, rhinorrhea, sneezing, mouth sores, trouble swallowing, neck pain, neck stiffness and tinnitus.   Respiratory: Denies SOB, DOE, cough, chest tightness,  and wheezing.   Cardiovascular: Denies chest pain, palpitations and leg swelling.  Gastrointestinal: Denies nausea, vomiting, abdominal pain, diarrhea, constipation, blood in stool and abdominal distention.  Genitourinary: Denies dysuria, urgency, frequency, hematuria, flank pain and difficulty urinating.  Endocrine: Denies: hot or cold intolerance, sweats, changes in hair or nails, polyuria, polydipsia. Musculoskeletal: Denies myalgias, back pain, joint swelling, arthralgias and gait problem.  Skin: Denies pallor, rash and wound.  Neurological: Denies dizziness, seizures, syncope, weakness, light-headedness, numbness and headaches.  Hematological: Denies adenopathy. Easy bruising, personal or family bleeding history  Psychiatric/Behavioral: Denies suicidal ideation, mood changes, confusion, nervousness, sleep disturbance and agitation    Physical Exam: Vitals:   06/11/22 1610  BP: 110/80  Pulse: 88  Temp: 97.7 F (36.5 C)  TempSrc: Oral  SpO2: 99%  Weight: 167 lb 1.6 oz (75.8 kg)    Body mass index is 22.66 kg/m.   Constitutional: NAD, calm, comfortable, ambulates with a cane Eyes: PERRL, lids and conjunctivae normal, wears corrective lenses ENMT: Mucous membranes are moist.  Psychiatric: Normal judgment and insight. Alert and oriented x 3. Normal mood.     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 -Refill hydrocodone 5/325 mg to take 1 tablet twice a day for total of 60 tablets a month x 3 months.  Time spent:21 minutes reviewing chart, interviewing and examining patient and formulating plan of care.      Lelon Frohlich, MD [facility] at [facility]

## 2022-06-26 ENCOUNTER — Other Ambulatory Visit: Payer: Self-pay | Admitting: Internal Medicine

## 2022-07-07 ENCOUNTER — Telehealth: Payer: Self-pay | Admitting: Internal Medicine

## 2022-07-07 NOTE — Telephone Encounter (Signed)
HYDROcodone-acetaminophen ([place]) 5-325 MG tablet   [facility] [place], [address] AT [address] Phone: [phone]  Fax: [phone]

## 2022-07-07 NOTE — Telephone Encounter (Signed)
Spoke with pharmacist and his refill will be available 07/11/21. Wife is aware.

## 2022-07-26 ENCOUNTER — Other Ambulatory Visit: Payer: Self-pay | Admitting: Internal Medicine

## 2022-07-26 DIAGNOSIS — M21371 Foot drop, right foot: Secondary | ICD-10-CM

## 2022-07-26 DIAGNOSIS — F411 Generalized anxiety disorder: Secondary | ICD-10-CM

## 2022-08-05 ENCOUNTER — Encounter: Payer: Self-pay | Admitting: Physician Assistant

## 2022-08-05 ENCOUNTER — Ambulatory Visit: Payer: Medicare Other | Admitting: Physician Assistant

## 2022-08-05 VITALS — BP 128/84 | HR 104 | Resp 18 | Wt 165.0 lb

## 2022-08-05 DIAGNOSIS — R413 Other amnesia: Secondary | ICD-10-CM | POA: Diagnosis not present

## 2022-08-05 MED ORDER — DONEPEZIL HCL 10 MG PO TABS: ORAL_TABLET | ORAL | 11 refills | Status: DC

## 2022-08-05 NOTE — Progress Notes (Addendum)
Assessment/Plan:   Memory Impairment  Perry Nielsen. is a very pleasant 82 y.o. RH male with a history of hypertension, hyperlipidemia, IBS, vitamin B12 deficiency, remote history of lymphoma, insomnia, hypothyroidism seen today in follow up for memory lossseen today in follow up for memory loss. Patient is currently on . MRI brain personally reviewed was without evidence of acute intracranial abnormality, but mild chronic small vessel ischemic disease has slightly progressed from prior MRI in 2017.  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  Patient is currently on donepezil 10 mg daily, tolerating well. MMSE today is 24/30 His memory is stable. He remains active and able to perform ADLs  Follow up in 6  months. Continue donepezil 10 mg daily     Subjective:    This patient is accompanied in the office by his wife who supplements the history.  Previous records as well as any outside records available were reviewed prior to todays visit. Patient was last seen on 02/02/22 at which time his MoCA was 10/30    Any changes in memory since last visit?" Good", no significant changes He enjoys doing crossword puzzles  repeats oneself?  Occasionally  Disoriented when walking into a room?  Patient denies   Leaving objects in unusual places?    denies   Wandering behavior?  denies   Any personality changes since last visit?  denies   Any worsening depression?:  denies   Hallucinations or paranoia?  denies   Seizures?    denies    Any sleep changes?   I have stuff on my mind sometimes, so I take lorazepam and an amitritpyline for it. Denies vivid dreams, REM behavior or sleepwalking   Sleep apnea?   denies   Any hygiene concerns?    denies   Independent of bathing and dressing?  Endorsed  Does the patient needs help with medications? Wife is in charge   Who is  in charge of the finances? Wife  is in charge     Any changes in appetite?  Denies, admits not  drinking enough water      Patient have trouble swallowing?  denies   Does the patient cook? No   Any headaches?   denies   Chronic back pain  denies   Ambulates with difficulty? He has chronic L foot drop,"I Swim often and helps with sleeping and mood" Recent falls or head injuries? denies     Unilateral weakness, numbness or tingling? Stays half way numb most of the time, so I try to do swimming, feels good .  Any tremors?  denies   Any anosmia?  Patient denies   Any incontinence of urine?  denies   Any bowel dysfunction?  Constipation some times  Patient lives  wife  Does the patient drive? No issues driving    Initial visit 12/31/2021   How long did patient have memory difficulties? 2 years ago he began repeating himself.  Then he began having problems with short-term memory.  He also began forgetting the names of his close friends.  The other day he went to a funeral, and along his way home he has his wife "who died?  ".  Sometimes he asked his wife who lives with them?. Patient lives with: Spouse   repeats oneself? Endorsed, "he is asking the same questions 1 minute apart "-wife says. Disoriented when walking into a room?  A few months ago, he was concerned because he was not at home (while he was).  He was looking for his house in [place] (he never had a house there).  Leaving objects in unusual places?  Patient denies   Ambulates  with difficulty?  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  Patient denies   Any head injuries?  Patient denies   History of seizures?   Patient denies   Wandering behavior?  Patient denies   Patient drives?  The patient was a Scientist, research (physical sciences), and continues to drive around town without any issues.  He denies getting lost.  Any mood changes. He is more argumentative than before .  Any history of  depression?:  Patient denies   Hallucinations?  Patient denies  Paranoia?  Patient denies   Patient reports that he sleeps well without vivid dreams, REM behavior or sleepwalking    History of sleep apnea?  Patient denies   Any hygiene concerns?  Patient denies   Independent of bathing and dressing?  Endorsed  Does the patient needs help with medications? Wife in charge  Who is in charge of the finances?  Patient is in charge  Any changes in appetite?  Patient denies   Patient have trouble swallowing? Patient denies   Does the patient cook?  Patient denies   Any kitchen accidents such as leaving the stove on? Patient denies   Any headaches?  Patient denies   The double vision? Patient denies   Any focal numbness or tingling?  Patient denies   Chronic back pain Patient denies   Unilateral weakness?  He has a history of chronic right foot drop, but no other areas of numbness or weakness. Any tremors?  R hand minimal tremor  Any history of anosmia?  Patient denies   Any incontinence of urine?  Patient denies   Any bowel dysfunction?   He has a history of IBS, occasionally he has constipation and diarrhea. History of heavy alcohol intake?  Patient denies   History of heavy tobacco use?  Patient denies   Family history of dementia?  Strong family history of Alzheimer's disease in father, sister and brother.     PREVIOUS MEDICATIONS:   CURRENT MEDICATIONS:  Outpatient Encounter Medications as of 08/05/2022  Medication Sig   amitriptyline (ELAVIL) 25 MG tablet TAKE 1 TABLET(25 MG) BY MOUTH AT BEDTIME   HYDROcodone-acetaminophen (NORCO) 5-325 MG tablet Take 1 tablet by mouth 2 (two) times daily as needed for moderate pain.   HYDROcodone-acetaminophen (NORCO/VICODIN) 5-325 MG tablet Take 1 tablet by mouth 2 (two) times daily as needed for moderate pain.   HYDROcodone-acetaminophen (NORCO/VICODIN) 5-325 MG tablet Take 1 tablet by mouth 2 (two) times daily as needed for moderate pain.    levothyroxine (SYNTHROID) 25 MCG tablet TAKE 1 TABLET(25 MCG) BY MOUTH DAILY BEFORE BREAKFAST   LORazepam (ATIVAN) 0.5 MG tablet TAKE 1 TABLET(0.5 MG) BY MOUTH TWICE DAILY AS NEEDED FOR ANXIETY   oxymetazoline (AFRIN) 0.05 % nasal spray Place 1 spray into both nostrils 2 (two) times daily as needed for congestion.   Polyethyl Glycol-Propyl Glycol (SYSTANE OP) Apply 1 drop to eye daily as needed (dryness).   Polyethylene Glycol 3350 (MIRALAX PO) Take 17 g by mouth daily as needed.   [DISCONTINUED] donepezil (ARICEPT) 10 MG tablet Take half tablet (5 mg) daily for 2 weeks, then increase to the full tablet at 10 mg daily  donepezil (ARICEPT) 10 MG tablet Take one tablet at 10 mg daily   No facility-administered encounter medications on file as of 08/05/2022.       08/05/2022    6:00 PM 12/11/2021    3:09 PM  MMSE - Mini Mental State Exam  Orientation to time 2 0  Orientation to Place 5 5  Registration 3 3  Attention/ Calculation 4 2  Recall 1 0  Language- name 2 objects 2 2  Language- repeat 1 1  Language- follow 3 step command 3 3  Language- read & follow direction 1 1  Write a sentence 1 1  Copy design 1 0  Total score 24 18      10/14/2015    1:06 PM  Montreal Cognitive Assessment   Visuospatial/ Executive (0/5) 2  Naming (0/3) 3  Attention: Read list of digits (0/2) 2  Attention: Read list of letters (0/1) 1  Attention: Serial 7 subtraction starting at 100 (0/3) 3  Language: Repeat phrase (0/2) 2  Language : Fluency (0/1) 1  Abstraction (0/2) 2  Delayed Recall (0/5) 0  Orientation (0/6) 5  Total 21  Adjusted Score (based on education) 21    Objective:     PHYSICAL EXAMINATION:    VITALS:   Vitals:   08/05/22 1527  BP: 128/84  Pulse: (!) 104  Resp: 18  SpO2: 98%  Weight: 165 lb (74.8 kg)    GEN:  The patient appears stated age and is in NAD. HEENT:  Normocephalic, atraumatic.   Neurological examination:  General: NAD, well-groomed, appears stated  age. Orientation: The patient is alert. Oriented to person, place and not to date Cranial nerves: There is good facial symmetry.The speech is fluent and clear. No aphasia or dysarthria. Fund of knowledge is appropriate. Recent and remote memory are impaired. Attention and concentration are reduced.  Able to name objects and repeat phrases.  Hearing is intact to conversational tone.    Sensation: Sensation is intact to light touch throughout Motor: Strength is at least antigravity x4. R foot drop, chronic.  DTR's 2/4 in UE/LE     Movement examination: Tone: There is normal tone in the UE/LE Abnormal movements:  no tremor.  No myoclonus.  No asterixis.   Coordination:  There is no decremation with RAM's. Normal finger to nose  Gait and Station: The patient has some difficulty arising out of a deep-seated chair without the use of the hands. The patient's stride length is slightly difficult due to R foot drop, unable to heel toe, able to ammbulate in tandem fashion and to stand in Romberg position .  Gait is cautious and narrow.    Thank you for allowing [place] the opportunity to participate in the care of this nice patient. Please do not hesitate to contact us for any questions or concerns.   Total time spent on today's visit was 31 minutes dedicated to this patient today, preparing to see patient, examining the patient, ordering tests and/or medications and counseling the patient, documenting clinical information in the EHR or other health record, independently interpreting results and communicating results to the patient/family, discussing treatment and goals, answering patient's questions and coordinating care.  Cc:  Isaac Bliss, Rayford Halsted, MD  Sharene Butters 08/09/2022 12:44 PM

## 2022-08-05 NOTE — Patient Instructions (Signed)
It was a pleasure to see you today at our office.   Recommendations:  Follow up in 6 month Continue taking donepezil 10 mg daily    Whom to call:  Memory  decline, memory medications: Call our office [phone]   For psychiatric meds, mood meds: Please have your primary care physician manage these medications.   Counseling regarding caregiver distress, including caregiver depression, anxiety and issues regarding community resources, adult day care programs, adult living facilities, or memory care questions:   Feel free to contact [place], Social Worker at [phone]   For assessment of decision of mental capacity and competency:  Call Dr. Anthoney Harada, geriatric psychiatrist at [phone]  For guidance in geriatric dementia issues please call [facility] [phone]  For guidance regarding [facility] and if placement were needed at the facility, contact Arnell Asal, Social Worker tel: [phone]  If you have any severe symptoms of a stroke, or other severe issues such as confusion,severe chills or fever, etc call 911 or go to the ER as you may need to be evaluated further      RECOMMENDATIONS FOR ALL PATIENTS WITH MEMORY PROBLEMS: 1. Continue to exercise (Recommend 30 minutes of walking everyday, or 3 hours every week) 2. Increase social interactions - continue going to [place] and enjoy social gatherings with friends and family 3. Eat healthy, avoid fried foods and eat more fruits and vegetables 4. Maintain adequate blood pressure, blood sugar, and blood cholesterol level. Reducing the risk of stroke and cardiovascular disease also helps promoting better memory. 5. Avoid stressful situations. Live a simple life and avoid aggravations. Organize your time and prepare for the next day in anticipation. 6. Sleep well, avoid any interruptions of sleep and avoid any distractions in the bedroom that may interfere with adequate  sleep quality 7. Avoid sugar, avoid sweets as there is a strong link between excessive sugar intake, diabetes, and cognitive impairment We discussed the Mediterranean diet, which has been shown to help patients reduce the risk of progressive memory disorders and reduces cardiovascular risk. This includes eating fish, eat fruits and green leafy vegetables, nuts like almonds and hazelnuts, walnuts, and also use olive oil. Avoid fast foods and fried foods as much as possible. Avoid sweets and sugar as sugar use has been linked to worsening of memory function.  There is always a concern of gradual progression of memory problems. If this is the case, then we may need to adjust level of care according to patient needs. Support, both to the patient and caregiver, should then be put into place.     FALL PRECAUTIONS: Be cautious when walking. Scan the area for obstacles that may increase the risk of trips and falls. When getting up in the mornings, sit up at the edge of the bed for a few minutes before getting out of bed. Consider elevating the bed at the head end to avoid drop of blood pressure when getting up. Walk always in a well-lit room (use night lights in the walls). Avoid area rugs or power cords from appliances in the middle of the walkways. Use a walker or a cane if necessary and consider physical therapy for balance exercise. Get your eyesight checked regularly.  FINANCIAL OVERSIGHT: Supervision, especially oversight when making financial decisions or transactions is also recommended.  HOME SAFETY: Consider the safety of the kitchen when operating appliances like stoves, microwave oven, and blender. Consider having supervision and share cooking responsibilities until no longer able  to participate in those. Accidents with firearms and other hazards in the house should be identified and addressed as well.   ABILITY TO BE LEFT ALONE: If patient is unable to contact 911 operator, consider using  LifeLine, or when the need is there, arrange for someone to stay with patients. Smoking is a fire hazard, consider supervision or cessation. Risk of wandering should be assessed by caregiver and if detected at any point, supervision and safe proof recommendations should be instituted.  MEDICATION SUPERVISION: Inability to self-administer medication needs to be constantly addressed. Implement a mechanism to ensure safe administration of the medications.   DRIVING: Regarding driving, in patients with progressive memory problems, driving will be impaired. We advise to have someone else do the driving if trouble finding directions or if minor accidents are reported. Independent driving assessment is available to determine safety of driving.   If you are interested in the driving assessment, you can contact the following:  [employer] in [place]  [place] [phone] or [phone]    [place] refers to food and lifestyle choices that are based on the traditions of countries located on the [employer]. This way of eating has been shown to help prevent certain conditions and improve outcomes for people who have chronic diseases, like kidney disease and heart disease. What are tips for following this plan? Lifestyle  Cook and eat meals together with your family, when possible. Drink enough fluid to keep your urine clear or pale yellow. Be physically active every day. This includes: Aerobic exercise like running or swimming. Leisure activities like gardening, walking, or housework. Get 7-8 hours of sleep each night. If recommended by your health care provider, drink red wine in moderation. This means 1 glass a day for nonpregnant women and 2 glasses a day for men. A glass of wine equals 5 oz (150 mL). Reading food labels  Check the  serving size of packaged foods. For foods such as rice and pasta, the serving size refers to the amount of cooked product, not dry. Check the total fat in packaged foods. Avoid foods that have saturated fat or trans fats. Check the ingredients list for added sugars, such as corn syrup. Shopping  At the grocery store, buy most of your food from the areas near the walls of the store. This includes: Fresh fruits and vegetables (produce). Grains, beans, nuts, and seeds. Some of these may be available in unpackaged forms or large amounts (in bulk). Fresh seafood. Poultry and eggs. Low-fat dairy products. Buy whole ingredients instead of prepackaged foods. Buy fresh fruits and vegetables in-season from local farmers markets. Buy frozen fruits and vegetables in resealable bags. If you do not have access to quality fresh seafood, buy precooked frozen shrimp or canned fish, such as tuna, salmon, or sardines. Buy small amounts of raw or cooked vegetables, salads, or olives from the deli or salad bar at your store. Stock your pantry so you always have certain foods on hand, such as olive oil, canned tuna, canned tomatoes, rice, pasta, and beans. Cooking  Cook foods with extra-virgin olive oil instead of using butter or other vegetable oils. Have meat as a side dish, and have vegetables or grains as your main dish. This means having meat in small portions or adding small amounts of meat to foods like pasta or stew. Use beans or vegetables instead of meat in common dishes like  chili or lasagna. Experiment with different cooking methods. Try roasting or broiling vegetables instead of steaming or sauteing them. Add frozen vegetables to soups, stews, pasta, or rice. Add nuts or seeds for added healthy fat at each meal. You can add these to yogurt, salads, or vegetable dishes. Marinate fish or vegetables using olive oil, lemon juice, garlic, and fresh herbs. Meal planning  Plan to eat 1 vegetarian meal one  day each week. Try to work up to 2 vegetarian meals, if possible. Eat seafood 2 or more times a week. Have healthy snacks readily available, such as: Vegetable sticks with hummus. Greek yogurt. Fruit and nut trail mix. Eat balanced meals throughout the week. This includes: Fruit: 2-3 servings a day Vegetables: 4-5 servings a day Low-fat dairy: 2 servings a day Fish, poultry, or lean meat: 1 serving a day Beans and legumes: 2 or more servings a week Nuts and seeds: 1-2 servings a day Whole grains: 6-8 servings a day Extra-virgin olive oil: 3-4 servings a day Limit red meat and sweets to only a few servings a month What are my food choices? Mediterranean diet Recommended Grains: Whole-grain pasta. Brown rice. Bulgar wheat. Polenta. Couscous. Whole-wheat bread. [place]. Vegetables: Artichokes. Beets. Broccoli. Cabbage. Carrots. Eggplant. Green beans. Chard. Kale. Spinach. Onions. Leeks. Peas. Squash. Tomatoes. Peppers. Radishes. Fruits: Apples. Apricots. Avocado. Berries. Bananas. Cherries. Dates. Figs. Grapes. Lemons. Melon. Oranges. Peaches. Plums. Pomegranate. Meats and other protein foods: Beans. Almonds. Sunflower seeds. Pine nuts. Peanuts. [place]. Salmon. Scallops. Shrimp. [place]. Tilapia. Clams. Oysters. Eggs. Dairy: Low-fat milk. Cheese. Greek yogurt. Beverages: Water. Red wine. Herbal tea. Fats and oils: Extra virgin olive oil. Avocado oil. Grape seed oil. Sweets and desserts: [place] yogurt with honey. Baked apples. Poached pears. Trail mix. Seasoning and other foods: Basil. Cilantro. Coriander. Cumin. Mint. Parsley. Sage. Rosemary. Tarragon. Garlic. Oregano. Thyme. Pepper. Balsalmic vinegar. Tahini. Hummus. Tomato sauce. Olives. Mushrooms. Limit these Grains: Prepackaged pasta or rice dishes. Prepackaged cereal with added sugar. Vegetables: Deep fried potatoes (french fries). Fruits: Fruit canned in syrup. Meats and other protein foods: Beef. Pork. Lamb. Poultry with skin.  Hot dogs. Berniece Salines. Dairy: Ice cream. Sour cream. Whole milk. Beverages: Juice. Sugar-sweetened soft drinks. Beer. Liquor and spirits. Fats and oils: Butter. Canola oil. Vegetable oil. Beef fat (tallow). Lard. Sweets and desserts: Cookies. Cakes. Pies. Candy. Seasoning and other foods: Mayonnaise. Premade sauces and marinades. The items listed may not be a complete list. Talk with your dietitian about what dietary choices are right for you. Summary The Mediterranean diet includes both food and lifestyle choices. Eat a variety of fresh fruits and vegetables, beans, nuts, seeds, and whole grains. Limit the amount of red meat and sweets that you eat. Talk with your health care provider about whether it is safe for you to drink red wine in moderation. This means 1 glass a day for nonpregnant women and 2 glasses a day for men. A glass of wine equals 5 oz (150 mL). This information is not intended to replace advice given to you by your health care provider. Make sure you discuss any questions you have with your health care provider. Document Released: 02/13/2016 Document Revised: 03/17/2016 Document Reviewed: 02/13/2016 Elsevier Interactive Patient Education  2017 [employer].

## 2022-08-06 ENCOUNTER — Telehealth: Payer: Self-pay | Admitting: Internal Medicine

## 2022-08-06 NOTE — Telephone Encounter (Signed)
Spoke with pharmacist [place].  Rx will be ready today.  Wife is aware.

## 2022-08-06 NOTE — Telephone Encounter (Signed)
Prescription Request  08/06/2022  Is this a "Controlled Substance" medicine? Yes  LOV: 06/11/2022  What is the name of the medication or equipment? HYDROcodone-acetaminophen (NORCO) 5-325 MG tablet HYDROcodone-acetaminophen (NORCO/VICODIN) 5-325 MG tablet  HYDROcodone-acetaminophen (NORCO/VICODIN) 5-325 MG tablet     Have you contacted your pharmacy to request a refill? No   Which pharmacy would you like this sent to?  [facility], [address] AT [address] [place] [address] Phone: [phone] Fax: [phone]     Patient notified that their request is being sent to the clinical staff for review and that they should receive a response within 2 business days.   Please advise at Mobile [phone] (mobile)

## 2022-08-21 ENCOUNTER — Other Ambulatory Visit: Payer: Self-pay | Admitting: Internal Medicine

## 2022-08-21 DIAGNOSIS — E039 Hypothyroidism, unspecified: Secondary | ICD-10-CM

## 2022-08-31 ENCOUNTER — Other Ambulatory Visit: Payer: Self-pay | Admitting: Internal Medicine

## 2022-08-31 DIAGNOSIS — C8580 Other specified types of non-Hodgkin lymphoma, unspecified site: Secondary | ICD-10-CM

## 2022-08-31 NOTE — Telephone Encounter (Signed)
Prescription Request  08/31/2022  Is this a "Controlled Substance" medicine? No  LOV: 06/11/2022  What is the name of the medication or equipment? HYDROcodone-acetaminophen (NORCO) 5-325 MG tablet   Have you contacted your pharmacy to request a refill? No   Which pharmacy would you like this sent to?  [facility], [address] AT [address] [place] [address] Phone: [phone] Fax: [phone]   Patient notified that their request is being sent to the clinical staff for review and that they should receive a response within 2 business days.   Please advise at Mobile [phone] (mobile)

## 2022-09-02 NOTE — Telephone Encounter (Signed)
Spoke with pharmacist and next refill is due 09/08/22.  A new prescription will be needed.

## 2022-09-03 NOTE — Telephone Encounter (Signed)
Patient's spouse called to check progress. Advised that message was received and the pharmacy was aware

## 2022-09-08 MED ORDER — HYDROCODONE-ACETAMINOPHEN 5-325 MG PO TABS: 1.0000 | ORAL_TABLET | Freq: Two times a day (BID) | ORAL | 0 refills | Status: DC | PRN

## 2022-09-08 NOTE — Addendum Note (Signed)
Addended by: Westley Hummer B on: 09/08/2022 10:37 AM   Modules accepted: Orders

## 2022-09-08 NOTE — Telephone Encounter (Signed)
Refill sent.

## 2022-09-08 NOTE — Telephone Encounter (Signed)
Pt wife is calling checking on the hydrocodone refill

## 2022-10-06 ENCOUNTER — Telehealth: Payer: Self-pay | Admitting: Internal Medicine

## 2022-10-06 NOTE — Telephone Encounter (Signed)
Prescription Request  10/06/2022  LOV: 06/11/2022  What is the name of the medication or equipment? HYDROcodone-acetaminophen (NORCO) 5-325 MG tablet HYDROcodone-acetaminophen (NORCO/VICODIN) 5-325 MG tablet HYDROcodone-acetaminophen (NORCO/VICODIN) 5-325 MG tablet  Have you contacted your pharmacy to request a refill? No   Which pharmacy would you like this sent to?  [facility], [address] AT [address] [place] [address] Phone: [phone] Fax: [phone]      Patient notified that their request is being sent to the clinical staff for review and that they should receive a response within 2 business days.   Please advise at Mobile [phone] (mobile) She is also requesting a call to make sure it gets done

## 2022-10-07 NOTE — Telephone Encounter (Signed)
Spoke with pharmacist Saralyn Pilar and prescription will be ready 10/07/21.  Wife is aware.

## 2022-10-09 ENCOUNTER — Observation Stay (HOSPITAL_COMMUNITY): Payer: Medicare Other

## 2022-10-09 ENCOUNTER — Encounter (HOSPITAL_COMMUNITY): Payer: Self-pay | Admitting: Emergency Medicine

## 2022-10-09 ENCOUNTER — Other Ambulatory Visit: Payer: Self-pay

## 2022-10-09 ENCOUNTER — Inpatient Hospital Stay (HOSPITAL_COMMUNITY)
Admission: EM | Admit: 2022-10-09 | Discharge: 2022-10-13 | DRG: 690 | Disposition: A | Discharge status: Home / Self Care | Payer: Medicare Other | Attending: Internal Medicine | Admitting: Internal Medicine

## 2022-10-09 DIAGNOSIS — Z85828 Personal history of other malignant neoplasm of skin: Secondary | ICD-10-CM

## 2022-10-09 DIAGNOSIS — R339 Retention of urine, unspecified: Secondary | ICD-10-CM

## 2022-10-09 DIAGNOSIS — Z79899 Other long term (current) drug therapy: Secondary | ICD-10-CM | POA: Diagnosis not present

## 2022-10-09 DIAGNOSIS — Z8249 Family history of ischemic heart disease and other diseases of the circulatory system: Secondary | ICD-10-CM | POA: Diagnosis not present

## 2022-10-09 DIAGNOSIS — R5383 Other fatigue: Secondary | ICD-10-CM | POA: Diagnosis present

## 2022-10-09 DIAGNOSIS — R531 Weakness: Secondary | ICD-10-CM

## 2022-10-09 DIAGNOSIS — Z79890 Hormone replacement therapy (postmenopausal): Secondary | ICD-10-CM | POA: Diagnosis not present

## 2022-10-09 DIAGNOSIS — R351 Nocturia: Secondary | ICD-10-CM | POA: Diagnosis present

## 2022-10-09 DIAGNOSIS — I10 Essential (primary) hypertension: Secondary | ICD-10-CM | POA: Diagnosis not present

## 2022-10-09 DIAGNOSIS — K219 Gastro-esophageal reflux disease without esophagitis: Secondary | ICD-10-CM | POA: Diagnosis present

## 2022-10-09 DIAGNOSIS — Z9189 Other specified personal risk factors, not elsewhere classified: Secondary | ICD-10-CM | POA: Diagnosis not present

## 2022-10-09 DIAGNOSIS — F0280 Dementia in other diseases classified elsewhere without behavioral disturbance: Secondary | ICD-10-CM | POA: Diagnosis present

## 2022-10-09 DIAGNOSIS — G8929 Other chronic pain: Secondary | ICD-10-CM | POA: Diagnosis present

## 2022-10-09 DIAGNOSIS — N401 Enlarged prostate with lower urinary tract symptoms: Secondary | ICD-10-CM | POA: Diagnosis present

## 2022-10-09 DIAGNOSIS — E039 Hypothyroidism, unspecified: Secondary | ICD-10-CM | POA: Diagnosis not present

## 2022-10-09 DIAGNOSIS — N390 Urinary tract infection, site not specified: Principal | ICD-10-CM | POA: Diagnosis present

## 2022-10-09 DIAGNOSIS — Z9081 Acquired absence of spleen: Secondary | ICD-10-CM

## 2022-10-09 DIAGNOSIS — D72825 Bandemia: Secondary | ICD-10-CM | POA: Diagnosis present

## 2022-10-09 DIAGNOSIS — C8580 Other specified types of non-Hodgkin lymphoma, unspecified site: Secondary | ICD-10-CM | POA: Diagnosis not present

## 2022-10-09 DIAGNOSIS — C8590 Non-Hodgkin lymphoma, unspecified, unspecified site: Secondary | ICD-10-CM | POA: Diagnosis not present

## 2022-10-09 DIAGNOSIS — G459 Transient cerebral ischemic attack, unspecified: Secondary | ICD-10-CM | POA: Diagnosis not present

## 2022-10-09 DIAGNOSIS — N3000 Acute cystitis without hematuria: Secondary | ICD-10-CM

## 2022-10-09 DIAGNOSIS — K5900 Constipation, unspecified: Secondary | ICD-10-CM | POA: Diagnosis not present

## 2022-10-09 DIAGNOSIS — Z79891 Long term (current) use of opiate analgesic: Secondary | ICD-10-CM | POA: Diagnosis not present

## 2022-10-09 DIAGNOSIS — B9620 Unspecified Escherichia coli [E. coli] as the cause of diseases classified elsewhere: Secondary | ICD-10-CM | POA: Diagnosis present

## 2022-10-09 DIAGNOSIS — Z8049 Family history of malignant neoplasm of other genital organs: Secondary | ICD-10-CM

## 2022-10-09 DIAGNOSIS — R338 Other retention of urine: Secondary | ICD-10-CM | POA: Diagnosis present

## 2022-10-09 DIAGNOSIS — Z743 Need for continuous supervision: Secondary | ICD-10-CM | POA: Diagnosis not present

## 2022-10-09 DIAGNOSIS — I4891 Unspecified atrial fibrillation: Secondary | ICD-10-CM | POA: Diagnosis not present

## 2022-10-09 DIAGNOSIS — I499 Cardiac arrhythmia, unspecified: Secondary | ICD-10-CM | POA: Diagnosis not present

## 2022-10-09 DIAGNOSIS — G309 Alzheimer's disease, unspecified: Secondary | ICD-10-CM | POA: Diagnosis present

## 2022-10-09 DIAGNOSIS — Z9079 Acquired absence of other genital organ(s): Secondary | ICD-10-CM

## 2022-10-09 DIAGNOSIS — N39490 Overflow incontinence: Secondary | ICD-10-CM | POA: Diagnosis present

## 2022-10-09 DIAGNOSIS — M6281 Muscle weakness (generalized): Secondary | ICD-10-CM | POA: Diagnosis not present

## 2022-10-09 DIAGNOSIS — Z8042 Family history of malignant neoplasm of prostate: Secondary | ICD-10-CM

## 2022-10-09 LAB — URINALYSIS, ROUTINE W REFLEX MICROSCOPIC
Bilirubin Urine: NEGATIVE
Glucose, UA: NEGATIVE mg/dL
Ketones, ur: NEGATIVE mg/dL
Nitrite: POSITIVE — AB
Protein, ur: NEGATIVE mg/dL
Specific Gravity, Urine: 1.011 (ref 1.005–1.030)
WBC, UA: >50 WBC/hpf (ref 0–5)
pH: 5 (ref 5.0–8.0)

## 2022-10-09 LAB — CBC WITH DIFFERENTIAL/PLATELET
Abs Immature Granulocytes: 0.23 10*3/uL — ABNORMAL HIGH (ref 0.00–0.07)
Basophils Absolute: 0.1 10*3/uL (ref 0.0–0.1)
Basophils Relative: 0 %
Eosinophils Absolute: 0 10*3/uL (ref 0.0–0.5)
Eosinophils Relative: 0 %
HCT: 43.6 % (ref 39.0–52.0)
Hemoglobin: 14.8 g/dL (ref 13.0–17.0)
Immature Granulocytes: 1 %
Lymphocytes Relative: 5 %
Lymphs Abs: 1.5 10*3/uL (ref 0.7–4.0)
MCH: 32.6 pg (ref 26.0–34.0)
MCHC: 33.9 g/dL (ref 30.0–36.0)
MCV: 96 fL (ref 80.0–100.0)
Monocytes Absolute: 4.5 10*3/uL — ABNORMAL HIGH (ref 0.1–1.0)
Monocytes Relative: 16 %
Neutro Abs: 22.4 10*3/uL — ABNORMAL HIGH (ref 1.7–7.7)
Neutrophils Relative %: 78 %
Platelets: 162 10*3/uL (ref 150–400)
RBC: 4.54 MIL/uL (ref 4.22–5.81)
RDW: 14.3 % (ref 11.5–15.5)
WBC: 28.8 10*3/uL — ABNORMAL HIGH (ref 4.0–10.5)
nRBC: 0 % (ref 0.0–0.2)

## 2022-10-09 LAB — BASIC METABOLIC PANEL
Anion gap: 10 (ref 5–15)
BUN: 11 mg/dL (ref 8–23)
CO2: 24 mmol/L (ref 22–32)
Calcium: 8.6 mg/dL — ABNORMAL LOW (ref 8.9–10.3)
Chloride: 106 mmol/L (ref 98–111)
Creatinine, Ser: 1.01 mg/dL (ref 0.61–1.24)
GFR, Estimated: >60 mL/min (ref >60)
Glucose, Bld: 111 mg/dL — ABNORMAL HIGH (ref 70–99)
Potassium: 3.7 mmol/L (ref 3.5–5.1)
Sodium: 140 mmol/L (ref 135–145)

## 2022-10-09 MED ORDER — POLYVINYL ALCOHOL 1.4 % OP SOLN: 1.0000 [drp] | Freq: Every day | OPHTHALMIC | Status: DC | PRN

## 2022-10-09 MED ORDER — ACETAMINOPHEN 325 MG PO TABS
650.0000 mg | ORAL_TABLET | Freq: Four times a day (QID) | ORAL | Status: DC | PRN
  Administered 2022-10-09 – 2022-10-13 (×3): 650 mg via ORAL
  Filled 2022-10-09 (×4): qty 2

## 2022-10-09 MED ORDER — ENOXAPARIN SODIUM 40 MG/0.4ML IJ SOSY
40.0000 mg | PREFILLED_SYRINGE | INTRAMUSCULAR | Status: DC
  Administered 2022-10-09 – 2022-10-12 (×4): 40 mg via SUBCUTANEOUS
  Filled 2022-10-09 (×4): qty 0.4

## 2022-10-09 MED ORDER — SODIUM CHLORIDE 0.9 % IV SOLN
1.0000 g | INTRAVENOUS | Status: DC
  Administered 2022-10-10 – 2022-10-12 (×3): 1 g via INTRAVENOUS
  Filled 2022-10-09 (×3): qty 10

## 2022-10-09 MED ORDER — ALBUTEROL SULFATE (2.5 MG/3ML) 0.083% IN NEBU: 2.5000 mg | INHALATION_SOLUTION | RESPIRATORY_TRACT | Status: DC | PRN

## 2022-10-09 MED ORDER — AMITRIPTYLINE HCL 25 MG PO TABS
25.0000 mg | ORAL_TABLET | Freq: Every day | ORAL | Status: DC
  Administered 2022-10-09 – 2022-10-12 (×4): 25 mg via ORAL
  Filled 2022-10-09 (×4): qty 1

## 2022-10-09 MED ORDER — LORAZEPAM 0.5 MG PO TABS
0.5000 mg | ORAL_TABLET | ORAL | Status: DC | PRN
  Administered 2022-10-09 – 2022-10-12 (×6): 0.5 mg via ORAL
  Filled 2022-10-09 (×6): qty 1

## 2022-10-09 MED ORDER — PHENAZOPYRIDINE HCL 100 MG PO TABS: 100.0000 mg | ORAL_TABLET | Freq: Three times a day (TID) | ORAL | Status: DC

## 2022-10-09 MED ORDER — SODIUM CHLORIDE 0.9 % IV SOLN
1.0000 g | Freq: Once | INTRAVENOUS | Status: AC
  Administered 2022-10-09: 1 g via INTRAVENOUS
  Filled 2022-10-09: qty 10

## 2022-10-09 MED ORDER — ACETAMINOPHEN 650 MG RE SUPP: 650.0000 mg | Freq: Four times a day (QID) | RECTAL | Status: DC | PRN

## 2022-10-09 MED ORDER — POLYETHYLENE GLYCOL 3350 17 G PO PACK
17.0000 g | PACK | Freq: Every day | ORAL | Status: DC | PRN
  Administered 2022-10-10: 17 g via ORAL

## 2022-10-09 MED ORDER — LEVOTHYROXINE SODIUM 25 MCG PO TABS
25.0000 ug | ORAL_TABLET | Freq: Every day | ORAL | Status: DC
  Administered 2022-10-10 – 2022-10-13 (×4): 25 ug via ORAL
  Filled 2022-10-09 (×4): qty 1

## 2022-10-09 MED ORDER — DONEPEZIL HCL 10 MG PO TABS
10.0000 mg | ORAL_TABLET | Freq: Every day | ORAL | Status: DC
  Administered 2022-10-09 – 2022-10-12 (×4): 10 mg via ORAL
  Filled 2022-10-09 (×4): qty 1

## 2022-10-09 MED ORDER — POTASSIUM CHLORIDE IN NACL 20-0.9 MEQ/L-% IV SOLN
INTRAVENOUS | Status: AC
  Filled 2022-10-09 (×2): qty 1000

## 2022-10-09 MED ORDER — DOCUSATE SODIUM 100 MG PO CAPS
100.0000 mg | ORAL_CAPSULE | Freq: Two times a day (BID) | ORAL | Status: DC
  Filled 2022-10-09: qty 1

## 2022-10-09 MED ORDER — OXYMETAZOLINE HCL 0.05 % NA SOLN: 1.0000 | Freq: Two times a day (BID) | NASAL | Status: AC | PRN

## 2022-10-09 MED ORDER — OXYBUTYNIN CHLORIDE 5 MG PO TABS
5.0000 mg | ORAL_TABLET | Freq: Three times a day (TID) | ORAL | Status: DC | PRN
  Administered 2022-10-09: 5 mg via ORAL
  Filled 2022-10-09: qty 1

## 2022-10-09 MED ORDER — PHENAZOPYRIDINE HCL 100 MG PO TABS
100.0000 mg | ORAL_TABLET | Freq: Three times a day (TID) | ORAL | Status: AC
  Administered 2022-10-09 – 2022-10-11 (×6): 100 mg via ORAL
  Filled 2022-10-09 (×6): qty 1

## 2022-10-09 MED ORDER — HYDROCODONE-ACETAMINOPHEN 5-325 MG PO TABS
1.0000 | ORAL_TABLET | Freq: Two times a day (BID) | ORAL | Status: DC | PRN
  Administered 2022-10-09 – 2022-10-13 (×7): 1 via ORAL
  Filled 2022-10-09 (×7): qty 1

## 2022-10-09 NOTE — ED Triage Notes (Signed)
Patient presents due to urinary retention which started today. He complains of a dull ache due to retention.     EMS vitas: 128/74 BP 118 HR 97% SPO2 on room air 135 CBG

## 2022-10-09 NOTE — ED Provider Notes (Addendum)
Perry Nielsen   CSN: [id number] Arrival date & time: 10/09/22  1204     History  Chief Complaint  Patient presents with   Urinary Retention    [name] is a 82 y.o. male.  82 year old male brought in by EMS to the emergency room for evaluation of his urinary retention x 1 day.  Patient reports he has no past medical history, however chart review does reveal diagnosis of Alzheimer's dementia, BPH, large cell lymphoma, hypertension, insomnia, hypothyroidism basal cell carcinoma, squamous cell carcinoma, and splenectomy.  Patient admits that over the last few days he has noticed increased difficulty initiating his urine stream and emptying his bladder.  However, over the past day he has noticed an inability to urinate and is only experiencing some overflow incontinence.  He does admit to pelvic discomfort and feels as though he needs to urinate.  He denies any recent changes in medications, surgeries, known toxic exposures, fevers, painful urination, back pain, or history of UTIs.   History is limited secondary to the patient's dementia.  He does not have any many in the emergency room with him and past medical history had to be obtained through patient records.  He is very pleasant and only admits to mild discomfort at this time.  Patient reports he does not have a local urologist.  The history is provided by the patient. History limited by: Dx dementia.       Home Medications Prior to Admission medications   Medication Sig Start Date End Date Taking? Authorizing Provider  amitriptyline (ELAVIL) 25 MG tablet TAKE 1 TABLET(25 MG) BY MOUTH AT BEDTIME 06/29/22   [name], [name], MD  donepezil (ARICEPT) 10 MG tablet Take one tablet at 10 mg daily 08/05/22   Marcos EkeWertman, [name], PA-C  HYDROcodone-acetaminophen (NORCO) 5-325 MG tablet Take 1 tablet by mouth 2 (two) times daily as needed for moderate pain. 09/08/22    [name], [name], MD  HYDROcodone-acetaminophen (NORCO/VICODIN) 5-325 MG tablet Take 1 tablet by mouth 2 (two) times daily as needed for moderate pain. 09/08/22   [name], [name], MD  HYDROcodone-acetaminophen (NORCO/VICODIN) 5-325 MG tablet Take 1 tablet by mouth 2 (two) times daily as needed for moderate pain. 09/08/22   [name], [name], MD  levothyroxine (SYNTHROID) 25 MCG tablet TAKE 1 TABLET(25 MCG) BY MOUTH DAILY BEFORE BREAKFAST 08/24/22   [name], [name], MD  LORazepam (ATIVAN) 0.5 MG tablet TAKE 1 TABLET(0.5 MG) BY MOUTH TWICE DAILY AS NEEDED FOR ANXIETY 07/27/22   [name], [name], MD  oxymetazoline (AFRIN) 0.05 % nasal spray Place 1 spray into both nostrils 2 (two) times daily as needed for congestion.    [provider]  Polyethyl Glycol-Propyl Glycol (SYSTANE OP) Apply 1 drop to eye daily as needed (dryness).    [provider]  Polyethylene Glycol 3350 (MIRALAX PO) Take 17 g by mouth daily as needed.    [provider]      Allergies    Antihistamines, diphenhydramine-type    Review of Systems   Review of Systems  Constitutional:  Negative for fatigue.  Genitourinary:  Positive for difficulty urinating. Negative for hematuria, penile pain and testicular pain.  All other systems reviewed and are negative.   Physical Exam Updated Vital Signs BP 111/76   Pulse 94   Temp 98.3 F (36.8 C) (Oral)   Resp 18   SpO2 97%  Physical  Exam Vitals and nursing Nielsen reviewed.  Constitutional:      General: He is not in acute distress. HENT:     Head: Normocephalic.     Nose: Nose normal.     Mouth/Throat:     Mouth: Mucous membranes are moist.  Eyes:     Extraocular Movements: Extraocular movements intact.     Conjunctiva/sclera: Conjunctivae normal.     Pupils: Pupils are equal, round, and reactive to light.  Cardiovascular:     Rate and Rhythm: Normal rate.     Pulses: Normal pulses.  Pulmonary:      Effort: Pulmonary effort is normal.  Abdominal:     General: Abdomen is flat. There is distension.     Palpations: Abdomen is soft.  Genitourinary:    Penis: Normal.   Musculoskeletal:        General: Normal range of motion.     Cervical back: Normal range of motion.  Skin:    General: Skin is warm.     Capillary Refill: Capillary refill takes less than 2 seconds.  Neurological:     Mental Status: He is alert. Mental status is at baseline.     ED Results / Procedures / Treatments   Labs (all labs ordered are listed, but only abnormal results are displayed) Labs Reviewed  URINALYSIS, ROUTINE W REFLEX MICROSCOPIC - Abnormal; Notable for the following components:      Result Value   APPearance HAZY (*)    Hgb urine dipstick SMALL (*)    Nitrite POSITIVE (*)    Leukocytes,Ua LARGE (*)    Bacteria, UA RARE (*)    All other components within normal limits  BASIC METABOLIC PANEL - Abnormal; Notable for the following components:   Glucose, Bld 111 (*)    Calcium 8.6 (*)    All other components within normal limits  CBC WITH DIFFERENTIAL/PLATELET - Abnormal; Notable for the following components:   WBC 28.8 (*)    Neutro Abs 22.4 (*)    Monocytes Absolute 4.5 (*)    Abs Immature Granulocytes 0.23 (*)    All other components within normal limits    EKG None  Radiology No results found.  Procedures Procedures    Medications Ordered in ED Medications  cefTRIAXone (ROCEPHIN) 1 g in sodium chloride 0.9 % 100 mL IVPB (has no administration in time range)    ED Course/ Medical Decision Making/ A&P Clinical Course as of 10/09/22 1454  Fri Oct 09, 2022  1437 Patient's wife is now at bedside and able to provide more HPI.  She states that the patient has been extremely weak over the last few days and they were unable to get him to stand up this morning.  They were concerned about some right-sided weakness that had resolved.  We will go ahead and CT his head with this  information.  Neuroexam remains unchanged without focal deficits.  He does have a foot drop in his right foot, however this reportedly has been present for the last 8 years. [AL]    Clinical Course User Index [AL] Rashanda Magloire, DO                             Medical Decision Making 82 year old male presenting to the emergency department for new onset acute urinary retention likely secondary to BPH.  He does have a history of having a prior TURP per chart review.  He reports 1 day of  difficulty initiating his urinary stream and inability to completely empty his bladder.  He is having some overflow incontinence. We proceeded with a bladder scan which revealed .  A Foley catheter was placed without complication.  Labs reveal elevated white count at 28. Urinalysis reveals nitrates, leukocytes, and white blood cells with rare bacteria.  Patient remains afebrile with stable vitals.  We will begin treating for a UTI.  His wife joined [place] at bedside and reported that her main concern was his generalized weakness.  She reports he is unable to get up this morning and could not perform activities of daily living.  We will add on a CT head.  No focal deficits or right-sided weakness appreciated on neuroexam here in the ED.  However, plan to admit him for further evaluation.  Rocephin started in the emergency department.  Urinary retention requiring Foley placement Generalized weakness with inability to ambulate Urinary tract infection Leukocytosis Baseline dementia, poor historian  Amount and/or Complexity of Data Reviewed Labs: ordered. Radiology: ordered.    Final Clinical Impression(s) / ED Diagnoses Final diagnoses:  Urinary retention  Acute UTI    Rx / DC Orders ED Discharge Orders     None         Willliam Pettet, DO 10/09/22 1454

## 2022-10-09 NOTE — H&P (Signed)
History and Physical  Perry Nielsen. UEA:[id number] DOB: 07/12/1940 DOA: 10/09/2022  PCP: Philip Aspen, Limmie Patricia, MD   Chief Complaint: Weakness  HPI: Perry Nielsen. is a 82 y.o. male with medical history significant for Alzheimer's dementia, BPH, large cell lymphoma, hypertension, hypothyroidism be admitted to the hospital with UTI.  History is provided by the patient and his wife, wife says he has been notably weak for the last few days, and this morning he was very lethargic, leaning to 1 side.  Patient is able to tell me that he feels like he has been having trouble urinating for the last couple of days.  Denies any recent changes in medication, surgery, fevers, dysuria, back pain or history of UTI.  In the emergency department  ED Course: In the emergency department, he has normal vital signs, labs are unremarkable except for white blood cell count of 29,000, and urinalysis consistent with UTI.  Foley catheter was placed, patient was empirically given IV Rocephin, and hospitalist was contacted for admission.  Review of Systems: Please see HPI for pertinent positives and negatives. A complete 10 system review of systems are otherwise negative.  Past Medical History:  Diagnosis Date   ALLERGIC RHINITIS 08/03/2007   BACK PAIN 09/18/2009   Basal cell carcinoma 08/21/2004   right wing nose ([facility])   BCC (basal cell carcinoma of skin) 06/07/2001   mid upper back   BCC (basal cell carcinoma) 03/08/2013   right forehead (curet & carter)   BPH associated with nocturia 02/18/2007   Qualifier: Diagnosis of  By: Briscoe Burns CMA, Alvy Beal     CAROTID BRUIT, LEFT 08/07/2008   ERECTILE DYSFUNCTION, MILD 08/07/2008   Foot drop    rt foot   GERD 02/18/2007   HAMMER TOE 08/03/2007   Hepatitis 1977   no residual problems, type A   KNEE PAIN, LEFT, CHRONIC 08/07/2008   Lymphoma    right hip   Rash, skin    torso   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   right nose (caulery, 74fu)   SCC  (squamous cell carcinoma) 11/24/2016   left upper arm (curetx3, 35fu)   SCC (squamous cell carcinoma) 04/20/2017   mid upper forehead  (tx after bx)   SCC (squamous cell carcinoma) 05/11/2019   left scapha   SCC (squamous cell carcinoma) 08/17/2019   mid back   Skin cancer    Sleeping difficulty    Squamous cell carcinoma of skin 07/31/2003   right brow (curetx3, 91fu)   Past Surgical History:  Procedure Laterality Date   CATARACT EXTRACTION W/ INTRAOCULAR LENS  IMPLANT, BILATERAL Bilateral    KNEE ARTHROSCOPY     left   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 He has never used smokeless tobacco. He reports current alcohol use. He reports that he does not use drugs.   Allergies  Allergen Reactions   Antihistamines, Diphenhydramine-Type     CAUSES PROSTATE TO SWELL & as a child had hyperactivity when took Benadryl Pt took Pepcid w/ Benadryl PO and did fine per Pt & his wife    Family History  Problem Relation Age of Onset   Pulmonary fibrosis  Mother    Uterine cancer Mother    Prostate cancer Father    Heart disease Brother    Heart attack Other    Aneurysm Other        aortic   Coronary artery disease Sister      Prior to Admission medications   Medication Sig Start Date End Date Taking? Authorizing Provider  amitriptyline (ELAVIL) 25 MG tablet TAKE 1 TABLET(25 MG) BY MOUTH AT BEDTIME 06/29/22   [name], [name], MD  donepezil (ARICEPT) 10 MG tablet Take one tablet at 10 mg daily 08/05/22   Marcos EkeWertman, [name], PA-C  HYDROcodone-acetaminophen (NORCO) 5-325 MG tablet Take 1 tablet by mouth 2 (two) times daily as needed for moderate pain. 09/08/22   Philip AspenHernandez  Acosta, [name], MD  HYDROcodone-acetaminophen (NORCO/VICODIN) 5-325 MG tablet Take 1 tablet by mouth 2 (two) times daily as needed for moderate pain. 09/08/22   [name], [name], MD  HYDROcodone-acetaminophen (NORCO/VICODIN) 5-325 MG tablet Take 1 tablet by mouth 2 (two) times daily as needed for moderate pain. 09/08/22   [name], [name], MD  levothyroxine (SYNTHROID) 25 MCG tablet TAKE 1 TABLET(25 MCG) BY MOUTH DAILY BEFORE BREAKFAST 08/24/22   [name], [name], MD  LORazepam (ATIVAN) 0.5 MG tablet TAKE 1 TABLET(0.5 MG) BY MOUTH TWICE DAILY AS NEEDED FOR ANXIETY 07/27/22   [name], [name], MD  oxymetazoline (AFRIN) 0.05 % nasal spray Place 1 spray into both nostrils 2 (two) times daily as needed for congestion.    [provider]  Polyethyl Glycol-Propyl Glycol (SYSTANE OP) Apply 1 drop to eye daily as needed (dryness).    [provider]  Polyethylene Glycol 3350 (MIRALAX PO) Take 17 g by mouth daily as needed.    [provider]    Physical Exam: BP 130/84   Pulse 86   Temp 98.2 F (36.8 C) (Oral)   Resp 18   SpO2 96%   General: Elderly gentleman appearing stated age, looks comfortable, pleasant and cooperative.  He is oriented to self and place.  Unable to provide much history. Eyes: EOMI, clear conjuctivae, white sclerea Neck: supple, no masses, trachea mildline  Cardiovascular: RRR, no murmurs or rubs, no peripheral edema  Respiratory: clear to auscultation bilaterally, no wheezes, no crackles  Abdomen: soft, nontender, nondistended, normal bowel tones heard  Skin: dry, no rashes  GU: Foley catheter in place with clear yellow urine. Musculoskeletal: no joint effusions, normal range of motion  Psychiatric: appropriate affect, normal speech  Neurologic: extraocular muscles intact, clear speech, moving all extremities with intact sensorium          Labs on Admission:  Basic Metabolic Panel: Recent Labs  Lab  10/09/22 1302  NA 140  K 3.7  CL 106  CO2 24  GLUCOSE 111*  BUN 11  CREATININE 1.01  CALCIUM 8.6*   Liver Function Tests: No results for input(s): "AST", "ALT", "ALKPHOS", "BILITOT", "PROT", "ALBUMIN" in the last 168 hours. No results for input(s): "LIPASE", "AMYLASE" in the last 168 hours. No results for input(s): "AMMONIA" in the last 168 hours. CBC: Recent Labs  Lab 10/09/22 1302  WBC 28.8*  NEUTROABS 22.4*  HGB 14.8  HCT 43.6  MCV 96.0  PLT 162   Cardiac Enzymes: No results for input(s): "CKTOTAL", "CKMB", "CKMBINDEX", "TROPONINI" in the last 168 hours.  BNP (last 3 results) No results for input(s): "BNP" in the last 8760 hours.  ProBNP (last 3 results) No results for input(s): "PROBNP"  in the last 8760 hours.  CBG: No results for input(s): "GLUCAP" in the last 168 hours.  Radiological Exams on Admission: No results found.  Assessment/Plan Principal Problem:   UTI (urinary tract infection)-patient presents with retention, leukocytosis, abnormal urinalysis.  He is not meeting sepsis criteria. -Observation admission -Continue IV Rocephin -Follow urine and blood culture -Regular diet  Active Problems, appropriate home medications have been resumed:   GERD   BPH associated with nocturia   Lymphoma, large cell (HCC)   Hypothyroidism   Dementia due to Alzheimer's disease  DVT prophylaxis: Lovenox     Code Status: Full Code, confirmed by patient.  Plan of care discussed in detail with the patient, as well as his wife and daughter at the bedside this afternoon.  All questions were answered to their satisfaction.  Consults called: None  Admission status: Obs   Time spent: 45 minutes  Janequa Kipnis Sharlette Dense MD [facility] Pager [phone]  If 7PM-7AM, please contact night-coverage [email or web address] Password [facility]  10/09/2022, 4:41 PM

## 2022-10-10 DIAGNOSIS — R531 Weakness: Secondary | ICD-10-CM

## 2022-10-10 DIAGNOSIS — I4891 Unspecified atrial fibrillation: Secondary | ICD-10-CM | POA: Diagnosis present

## 2022-10-10 DIAGNOSIS — Z8249 Family history of ischemic heart disease and other diseases of the circulatory system: Secondary | ICD-10-CM | POA: Diagnosis not present

## 2022-10-10 DIAGNOSIS — F0280 Dementia in other diseases classified elsewhere without behavioral disturbance: Secondary | ICD-10-CM | POA: Diagnosis present

## 2022-10-10 DIAGNOSIS — K219 Gastro-esophageal reflux disease without esophagitis: Secondary | ICD-10-CM

## 2022-10-10 DIAGNOSIS — C8580 Other specified types of non-Hodgkin lymphoma, unspecified site: Secondary | ICD-10-CM

## 2022-10-10 DIAGNOSIS — Z9189 Other specified personal risk factors, not elsewhere classified: Secondary | ICD-10-CM | POA: Diagnosis not present

## 2022-10-10 DIAGNOSIS — K5900 Constipation, unspecified: Secondary | ICD-10-CM | POA: Diagnosis present

## 2022-10-10 DIAGNOSIS — N401 Enlarged prostate with lower urinary tract symptoms: Secondary | ICD-10-CM | POA: Diagnosis present

## 2022-10-10 DIAGNOSIS — E039 Hypothyroidism, unspecified: Secondary | ICD-10-CM

## 2022-10-10 DIAGNOSIS — R351 Nocturia: Secondary | ICD-10-CM

## 2022-10-10 DIAGNOSIS — R5383 Other fatigue: Secondary | ICD-10-CM | POA: Diagnosis present

## 2022-10-10 DIAGNOSIS — B9620 Unspecified Escherichia coli [E. coli] as the cause of diseases classified elsewhere: Secondary | ICD-10-CM | POA: Diagnosis present

## 2022-10-10 DIAGNOSIS — Z85828 Personal history of other malignant neoplasm of skin: Secondary | ICD-10-CM | POA: Diagnosis not present

## 2022-10-10 DIAGNOSIS — Z9081 Acquired absence of spleen: Secondary | ICD-10-CM | POA: Diagnosis not present

## 2022-10-10 DIAGNOSIS — N3000 Acute cystitis without hematuria: Secondary | ICD-10-CM | POA: Diagnosis not present

## 2022-10-10 DIAGNOSIS — R339 Retention of urine, unspecified: Secondary | ICD-10-CM | POA: Diagnosis present

## 2022-10-10 DIAGNOSIS — Z79899 Other long term (current) drug therapy: Secondary | ICD-10-CM | POA: Diagnosis not present

## 2022-10-10 DIAGNOSIS — G309 Alzheimer's disease, unspecified: Secondary | ICD-10-CM | POA: Diagnosis present

## 2022-10-10 DIAGNOSIS — R338 Other retention of urine: Secondary | ICD-10-CM | POA: Diagnosis present

## 2022-10-10 DIAGNOSIS — G8929 Other chronic pain: Secondary | ICD-10-CM | POA: Diagnosis present

## 2022-10-10 DIAGNOSIS — Z8049 Family history of malignant neoplasm of other genital organs: Secondary | ICD-10-CM | POA: Diagnosis not present

## 2022-10-10 DIAGNOSIS — N390 Urinary tract infection, site not specified: Secondary | ICD-10-CM | POA: Diagnosis present

## 2022-10-10 DIAGNOSIS — Z79891 Long term (current) use of opiate analgesic: Secondary | ICD-10-CM | POA: Diagnosis not present

## 2022-10-10 DIAGNOSIS — Z79890 Hormone replacement therapy (postmenopausal): Secondary | ICD-10-CM | POA: Diagnosis not present

## 2022-10-10 DIAGNOSIS — C8590 Non-Hodgkin lymphoma, unspecified, unspecified site: Secondary | ICD-10-CM | POA: Diagnosis present

## 2022-10-10 DIAGNOSIS — I10 Essential (primary) hypertension: Secondary | ICD-10-CM | POA: Diagnosis present

## 2022-10-10 DIAGNOSIS — Z8042 Family history of malignant neoplasm of prostate: Secondary | ICD-10-CM | POA: Diagnosis not present

## 2022-10-10 DIAGNOSIS — D72825 Bandemia: Secondary | ICD-10-CM | POA: Insufficient documentation

## 2022-10-10 LAB — CBC
HCT: 42.2 % (ref 39.0–52.0)
Hemoglobin: 14 g/dL (ref 13.0–17.0)
MCH: 32.6 pg (ref 26.0–34.0)
MCHC: 33.2 g/dL (ref 30.0–36.0)
MCV: 98.4 fL (ref 80.0–100.0)
Platelets: 148 10*3/uL — ABNORMAL LOW (ref 150–400)
RBC: 4.29 MIL/uL (ref 4.22–5.81)
RDW: 14.6 % (ref 11.5–15.5)
WBC: 33 10*3/uL — ABNORMAL HIGH (ref 4.0–10.5)
nRBC: 0 % (ref 0.0–0.2)

## 2022-10-10 LAB — BASIC METABOLIC PANEL
Anion gap: 8 (ref 5–15)
BUN: 13 mg/dL (ref 8–23)
CO2: 24 mmol/L (ref 22–32)
Calcium: 8.3 mg/dL — ABNORMAL LOW (ref 8.9–10.3)
Chloride: 106 mmol/L (ref 98–111)
Creatinine, Ser: 0.96 mg/dL (ref 0.61–1.24)
GFR, Estimated: >60 mL/min (ref >60)
Glucose, Bld: 111 mg/dL — ABNORMAL HIGH (ref 70–99)
Potassium: 3.5 mmol/L (ref 3.5–5.1)
Sodium: 138 mmol/L (ref 135–145)

## 2022-10-10 LAB — CULTURE, BLOOD (ROUTINE X 2): Culture: NO GROWTH

## 2022-10-10 MED ORDER — TAMSULOSIN HCL 0.4 MG PO CAPS
0.4000 mg | ORAL_CAPSULE | Freq: Every day | ORAL | Status: DC
  Administered 2022-10-10 – 2022-10-12 (×3): 0.4 mg via ORAL
  Filled 2022-10-10 (×3): qty 1

## 2022-10-10 MED ORDER — SENNOSIDES-DOCUSATE SODIUM 8.6-50 MG PO TABS
1.0000 | ORAL_TABLET | Freq: Every day | ORAL | Status: DC
  Administered 2022-10-11 – 2022-10-12 (×2): 1 via ORAL
  Filled 2022-10-10 (×4): qty 1

## 2022-10-10 MED ORDER — CHLORHEXIDINE GLUCONATE CLOTH 2 % EX PADS
6.0000 | MEDICATED_PAD | Freq: Every day | CUTANEOUS | Status: DC
  Administered 2022-10-10: 6 via TOPICAL

## 2022-10-10 MED ORDER — POTASSIUM CHLORIDE CRYS ER 20 MEQ PO TBCR
40.0000 meq | EXTENDED_RELEASE_TABLET | Freq: Once | ORAL | Status: AC
  Administered 2022-10-10: 40 meq via ORAL
  Filled 2022-10-10: qty 2

## 2022-10-10 MED ORDER — BETHANECHOL CHLORIDE 10 MG PO TABS
25.0000 mg | ORAL_TABLET | Freq: Three times a day (TID) | ORAL | Status: DC
  Administered 2022-10-10: 25 mg via ORAL
  Filled 2022-10-10: qty 3

## 2022-10-10 NOTE — Evaluation (Addendum)
Physical Therapy Evaluation Patient Details Name: Perry SeashoreHenry F Cristiano Nielsen. MRN: [id number] DOB: [birth year] Today's Date: 10/10/2022  History of Present Illness  Pt is an 82yo [facility].   PMH: dementia, BPH, large cell lymphoma, HTN, hypothyroidism, back pain, GERD, L knee pain, R-RCR, R foot drop   Clinical Impression  Pt admitted with problems above and functional impairments below. Pt demonstrated modified independence for bed mobility, min guard for transfers and short-distance ambulation with R-AFO and RW ~7725ft, distance limited by fatigue and dizziness, RN notified. Pt presents with reduced strength RLE grossly, light [facility] to [facility] compared to [facility]. At baseline, pt mostly completes household ambulation [facility] and goes out on occasion to lunch with friends. Pt currently with functional limitations due to the deficits listed below (see PT Problem List). Pt will benefit from acute skilled PT to increase their independence and safety with mobility to allow discharge.         Recommendations for follow up therapy are one component of a multi-disciplinary discharge planning process, led by the attending physician.  Recommendations may be updated based on patient status, additional functional criteria and insurance authorization.  Assistance Recommended at Discharge Intermittent Supervision/Assistance  Patient can return home with the following  A little help with walking and/or transfers;A little help with bathing/dressing/bathroom;Assistance with cooking/housework;Assist for transportation;Help with stairs or ramp for entrance    Equipment Recommendations Rolling walker (2 wheels) (Pt reports he has standard walker (4-point walker); if this is the case he would benefit from 2WRW)  Recommendations for Other Services       Functional Status Assessment Patient has had a recent decline in their functional status and demonstrates the ability to make significant  improvements in function in a reasonable and predictable amount of time.     Precautions / Restrictions Precautions Precautions: Fall Precaution Comments: hx of falls Restrictions Weight Bearing Restrictions: No      Mobility  Bed Mobility Overal bed mobility: Modified Independent             General bed mobility comments: increased time    Transfers Overall transfer level: Needs assistance Equipment used: Rolling walker (2 wheels) Transfers: Sit to/from Stand, Bed to chair/wheelchair/BSC Sit to Stand: Min guard   Step pivot transfers: Min guard       General transfer comment: Pt required min guard for safety, no physical assist required or overt LOB noted, utilized RW, minimal cuing.    Ambulation/Gait Ambulation/Gait assistance: Min guard Gait Distance (Feet): 25 Feet Assistive device: Rolling walker (2 wheels) Gait Pattern/deviations: Step-to pattern, Decreased step length - right, Decreased step length - left, Decreased dorsiflexion - right, Step-through pattern Gait velocity: decreased     General Gait Details: Pt ambulated with RW and R-AFO, distance limited by fatigue and pt report of dizziness. Shortened step length, verbal cues for upright posture and visualizing pathway.  Stairs            Wheelchair Mobility    Modified Rankin (Stroke Patients Only)       Balance Overall balance assessment: Needs assistance, History of Falls Sitting-balance support: Feet supported, No upper extremity supported Sitting balance-Leahy Scale: Good     Standing balance support: Reliant on assistive device for balance, During functional activity, Bilateral upper extremity supported Standing balance-Leahy Scale: Poor  Pertinent Vitals/Pain Pain Assessment Pain Assessment: 0-10 Pain Score: 5  Pain Location: penis/bladder Pain Descriptors / Indicators: Operative site guarding Pain Intervention(s): Monitored during  session, Repositioned, Limited activity within patient's tolerance    Home Living Family/patient expects to be discharged to:: Private residence Living Arrangements: Spouse/significant other Available Help at Discharge: Family;Available 24 hours/day Type of Home: House Home Access: Stairs to enter Entrance Stairs-Rails: Can reach both Entrance Stairs-Number of Steps: 3   Home Layout: Two level;Bed/bath upstairs Home Equipment: Standard Walker;Cane - single point;Grab bars - tub/shower;Shower seat      Prior Function Prior Level of Function : History of Falls (last six months);Independent/Modified Independent             Mobility Comments: Uses SPC at all times, had a fall while getting dressed, swims 4-5x/week at aquatic center ADLs Comments: IND     Hand Dominance        Extremity/Trunk Assessment   Upper Extremity Assessment Upper Extremity Assessment: Defer to OT evaluation    Lower Extremity Assessment Lower Extremity Assessment: RLE deficits/detail;LLE deficits/detail RLE Deficits / Details: Grossly reduced strength,MMT hip/knee 3+/5, ankle DF absent (PTA), PF 4/5 RLE Sensation: WNL LLE Deficits / Details: MMT grossly hip/knee/ankle 4-./5 LLE Sensation: WNL    Cervical / Trunk Assessment Cervical / Trunk Assessment: Normal  Communication   Communication: No difficulties  Cognition Arousal/Alertness: Awake/alert Behavior During Therapy: WFL for tasks assessed/performed Overall Cognitive Status: Within Functional Limits for tasks assessed                                          General Comments General comments (skin integrity, edema, etc.): Wife [name] present    Exercises     Assessment/Plan    PT Assessment Patient needs continued PT services  PT Problem List Decreased strength;Decreased range of motion;Decreased activity tolerance;Decreased balance;Decreased mobility       PT Treatment Interventions DME instruction;Gait  training;Stair training;Functional mobility training;Therapeutic activities;Therapeutic exercise;Balance training;Neuromuscular re-education;Patient/family education    PT Goals (Current goals can be found in the Care Plan section)  Acute Rehab PT Goals Patient Stated Goal: to go home, reduce pain PT Goal Formulation: With patient Time For Goal Achievement: 10/24/22 Potential to Achieve Goals: Good    Frequency Min 3X/week     Co-evaluation               AM-PAC PT "6 Clicks" Mobility  Outcome Measure Help needed turning from your back to your side while in a flat bed without using bedrails?: None Help needed moving from lying on your back to sitting on the side of a flat bed without using bedrails?: A Little Help needed moving to and from a bed to a chair (including a wheelchair)?: A Little Help needed standing up from a chair using your arms (e.g., wheelchair or bedside chair)?: A Little Help needed to walk in hospital room?: A Little Help needed climbing 3-5 steps with a railing? : A Little 6 Click Score: 19    End of Session Equipment Utilized During Treatment: Gait belt Activity Tolerance: Patient tolerated treatment well;No increased pain Patient left: in bed;with call bell/phone within reach;with chair alarm set;with family/visitor present Nurse Communication: Mobility status PT Visit Diagnosis: Difficulty in walking, not elsewhere classified (R26.2);History of falling (Z91.81)    Time: 5027-7412 PT Time Calculation (min) (ACUTE ONLY): 28 min   Charges:   PT  Evaluation $PT Eval Low Complexity: 1 Low PT Treatments $Gait Training: 8-22 mins        Jamesetta Geralds, PT, DPT [facility] Rehabilitation Department Office: [phone] Weekend pager: [phone]  Jamesetta Geralds 10/10/2022, 11:29 AM

## 2022-10-10 NOTE — Progress Notes (Signed)
Occupational Therapy Treatment Patient Details Name: Perry Nielsen. MRN: [id number] DOB: Jan 05, 1941 Today's Date: 10/10/2022   History of present illness Pt is an 82yo male presenting to [facility] ED on 4/5 with UTI.   PMH: dementia, BPH, large cell lymphoma, HTN, hypothyroidism, back pain, GERD, L knee pain, R-RCR, R foot drop   OT comments  Pt needed encouragement to walk but did! Pt did want to return to bed after toileting and walking in hall with PT and OT.   Recommendations for follow up therapy are one component of a multi-disciplinary discharge planning process, led by the attending physician.  Recommendations may be updated based on patient status, additional functional criteria and insurance authorization.    Assistance Recommended at Discharge Frequent or constant Supervision/Assistance  Patient can return home with the following  A little help with walking and/or transfers;A little help with bathing/dressing/bathroom;Assistance with cooking/housework   Equipment Recommendations  BSC/3in1       Precautions / Restrictions Precautions Precautions: Fall Precaution Comments: hx of falls Restrictions Weight Bearing Restrictions: No       Mobility Bed Mobility Overal bed mobility: Needs Assistance Bed Mobility: Sit to Supine       Sit to supine: Min assist   General bed mobility comments: increased time    Transfers Overall transfer level: Needs assistance Equipment used: Rolling walker (2 wheels) Transfers: Sit to/from Stand, Bed to chair/wheelchair/BSC Sit to Stand: Min assist Stand pivot transfers: Min assist   Step pivot transfers: Min assist     General transfer comment: Pt required min guard for safety, no physical assist required or overt LOB noted, utilized RW, minimal cuing.     Balance Overall balance assessment: Needs assistance, History of Falls Sitting-balance support: Feet supported, No upper extremity supported Sitting balance-Leahy Scale:  Good     Standing balance support: Reliant on assistive device for balance, During functional activity, Bilateral upper extremity supported Standing balance-Leahy Scale: Poor                             ADL either performed or assessed with clinical judgement   ADL Overall ADL's : Needs assistance/impaired Eating/Feeding: Set up;Sitting   Grooming: Set up;Sitting   Upper Body Bathing: Minimal assistance;Sitting   Lower Body Bathing: Maximal assistance;Sit to/from stand;Cueing for safety;Cueing for sequencing   Upper Body Dressing : Minimal assistance;Sitting   Lower Body Dressing: Sit to/from stand;Cueing for safety;Cueing for sequencing;Maximal assistance Lower Body Dressing Details (indicate cue type and reason): shoes and AFO Toilet Transfer: Moderate assistance;Stand-pivot;BSC/3in1   Toileting- Clothing Manipulation and Hygiene: Maximal assistance;Sit to/from stand         General ADL Comments: wife present and did not seem concerned regarding pts mobility    Extremity/Trunk Assessment Upper Extremity Assessment Upper Extremity Assessment: Generalized weakness   Lower Extremity Assessment Lower Extremity Assessment: Generalized weakness        Vision Baseline Vision/History: 0 No visual deficits Patient Visual Report: No change from baseline            Cognition Arousal/Alertness: Awake/alert Behavior During Therapy: WFL for tasks assessed/performed Overall Cognitive Status: Within Functional Limits for tasks assessed  Pertinent Vitals/ Pain       Pain Assessment Pain Score: 6  Pain Location: penis/bladder Pain Descriptors / Indicators: Operative site guarding Pain Intervention(s): Limited activity within patient's tolerance, Monitored during session, Repositioned  Home Living Family/patient expects to be discharged to:: Private residence Living Arrangements:  Spouse/significant other Available Help at Discharge: Family;Available 24 hours/day Type of Home: House Home Access: Stairs to enter [employer] of Steps: 3 Entrance Stairs-Rails: Can reach both Home Layout: Two level;Bed/bath upstairs     Bathroom Shower/Tub: Producer, television/film/video: Handicapped height Bathroom Accessibility: Yes   Home Equipment: Standard Walker;Cane - single point;Grab bars - tub/shower;Shower seat              Frequency  Min 2X/week        Progress Toward Goals  OT Goals(current goals can now be found in the care plan section)  Progress towards OT goals: Progressing toward goals  Acute Rehab OT Goals Patient Stated Goal: get home and back to swimming OT Goal Formulation: With patient Time For Goal Achievement: 10/24/22 Potential to Achieve Goals: Good ADL Goals Pt Will Perform Grooming: with supervision;standing Pt Will Perform Upper Body Dressing: with supervision;standing Pt Will Perform Lower Body Dressing: with min assist;sit to/from stand Pt Will Transfer to Toilet: with modified independence;ambulating;grab bars Pt Will Perform Toileting - Clothing Manipulation and hygiene: with modified independence;sit to/from stand  Plan         AM-PAC OT "6 Clicks" Daily Activity     Outcome Measure   Help from another person eating meals?: None Help from another person taking care of personal grooming?: A Little Help from another person toileting, which includes using toliet, bedpan, or urinal?: A Lot Help from another person bathing (including washing, rinsing, drying)?: A Lot Help from another person to put on and taking off regular upper body clothing?: A Little Help from another person to put on and taking off regular lower body clothing?: A Lot 6 Click Score: 16    End of Session Equipment Utilized During Treatment: Rolling walker (2 wheels)  OT Visit Diagnosis: Unsteadiness on feet (R26.81);History of falling  (Z91.81);Other abnormalities of gait and mobility (R26.89);Muscle weakness (generalized) (M62.81)   Activity Tolerance Patient limited by fatigue   Patient Left with call bell/phone within reach;with chair alarm set;in bed;with family/visitor present   Nurse Communication Mobility status        Time: 8413-2440 OT Time Calculation (min): 28 min  Charges: OT General Charges $OT Visit: 1 Visit OT Evaluation $OT Eval Moderate Complexity: 1 Mod OT Treatments $Self Care/Home Management : 23-37 mins  Lise Auer, OT  [phone]     Einar Crow D 10/10/2022, 4:02 PM

## 2022-10-10 NOTE — Evaluation (Signed)
Occupational Therapy Evaluation Patient Details Name: Perry Nielsen. MRN: [id number] DOB: 1940/08/26 Today's Date: 10/10/2022   History of Present Illness Pt is an 82yo male presenting to [facility] ED on 4/5 with UTI.   PMH: dementia, BPH, large cell lymphoma, HTN, hypothyroidism, back pain, GERD, L knee pain, R-RCR, R foot drop   Clinical Impression   Pt admitted with UTI. Pt currently with functional limitations due to the deficits listed below (see OT Problem List).  Pt will benefit from acute skilled OT to increase their safety and independence with ADL and functional mobility for ADL to facilitate discharge.        Recommendations for follow up therapy are one component of a multi-disciplinary discharge planning process, led by the attending physician.  Recommendations may be updated based on patient status, additional functional criteria and insurance authorization.   Assistance Recommended at Discharge Frequent or constant Supervision/Assistance  Patient can return home with the following A little help with walking and/or transfers;A little help with bathing/dressing/bathroom;Assistance with cooking/housework    Functional Status Assessment  Patient has had a recent decline in their functional status and demonstrates the ability to make significant improvements in function in a reasonable and predictable amount of time.  Equipment Recommendations  BSC/3in1    Recommendations for Other Services       Precautions / Restrictions Precautions Precautions: Fall Precaution Comments: hx of falls Restrictions Weight Bearing Restrictions: No      Mobility Bed Mobility Overal bed mobility: Modified Independent             General bed mobility comments: increased time    Transfers Overall transfer level: Needs assistance Equipment used: Rolling walker (2 wheels) Transfers: Sit to/from Stand, Bed to chair/wheelchair/BSC Sit to Stand: Min guard     Step pivot transfers:  Min guard     General transfer comment: Pt required min guard for safety, no physical assist required or overt LOB noted, utilized RW, minimal cuing.      Balance Overall balance assessment: Needs assistance, History of Falls Sitting-balance support: Feet supported, No upper extremity supported Sitting balance-Leahy Scale: Good     Standing balance support: Reliant on assistive device for balance, During functional activity, Bilateral upper extremity supported Standing balance-Leahy Scale: Poor                             ADL either performed or assessed with clinical judgement   ADL Overall ADL's : Needs assistance/impaired Eating/Feeding: Set up;Sitting   Grooming: Set up;Sitting   Upper Body Bathing: Minimal assistance;Sitting   Lower Body Bathing: Maximal assistance;Sit to/from stand;Cueing for safety;Cueing for sequencing   Upper Body Dressing : Minimal assistance;Sitting   Lower Body Dressing: Sit to/from stand;Cueing for safety;Cueing for sequencing;Maximal assistance   Toilet Transfer: Moderate assistance;Stand-pivot;BSC/3in1   Toileting- Clothing Manipulation and Hygiene: Maximal assistance;Sit to/from stand         General ADL Comments: Pt will need A with ADL's at home. wife will A as needed as well as son per pt     Vision Patient Visual Report: No change from baseline              Pertinent Vitals/Pain Pain Assessment Pain Score: 5  Pain Location: penis/bladder Pain Descriptors / Indicators: Operative site guarding Pain Intervention(s): Limited activity within patient's tolerance        Extremity/Trunk Assessment Upper Extremity Assessment Upper Extremity Assessment: Generalized weakness  Communication Communication Communication: No difficulties   Cognition Arousal/Alertness: Awake/alert Behavior During Therapy: WFL for tasks assessed/performed Overall Cognitive Status: Within Functional Limits for tasks  assessed                                                  Home Living Family/patient expects to be discharged to:: Private residence Living Arrangements: Spouse/significant other Available Help at Discharge: Family;Available 24 hours/day Type of Home: House Home Access: Stairs to enter [employer] of Steps: 3 Entrance Stairs-Rails: Can reach both Home Layout: Two level;Bed/bath upstairs     Bathroom Shower/Tub: Producer, television/film/video: Handicapped height Bathroom Accessibility: Yes   Home Equipment: Standard Walker;Cane - single point;Grab bars - tub/shower;Shower seat          Prior Functioning/Environment Prior Level of Function : History of Falls (last six months);Independent/Modified Independent             Mobility Comments: Uses SPC at all times, had a fall while getting dressed, swims 4-5x/week at aquatic center ADLs Comments: IND        OT Problem List: Decreased strength;Decreased range of motion;Impaired balance (sitting and/or standing);Decreased activity tolerance      OT Treatment/Interventions: Self-care/ADL training;Therapeutic activities;DME and/or AE instruction;Patient/family education    OT Goals(Current goals can be found in the care plan section) Acute Rehab OT Goals Patient Stated Goal: get home and back to swimming OT Goal Formulation: With patient Time For Goal Achievement: 10/24/22 Potential to Achieve Goals: Good ADL Goals Pt Will Perform Grooming: with supervision;standing Pt Will Perform Upper Body Dressing: with supervision;standing Pt Will Perform Lower Body Dressing: with min assist;sit to/from stand Pt Will Transfer to Toilet: with modified independence;ambulating;grab bars Pt Will Perform Toileting - Clothing Manipulation and hygiene: with modified independence;sit to/from stand  OT Frequency: Min 2X/week       AM-PAC OT "6 Clicks" Daily Activity     Outcome Measure Help from another  person eating meals?: None Help from another person taking care of personal grooming?: A Little Help from another person toileting, which includes using toliet, bedpan, or urinal?: A Lot Help from another person bathing (including washing, rinsing, drying)?: A Lot Help from another person to put on and taking off regular upper body clothing?: A Little Help from another person to put on and taking off regular lower body clothing?: A Lot 6 Click Score: 16   End of Session Equipment Utilized During Treatment: Rolling walker (2 wheels) Nurse Communication: Mobility status  Activity Tolerance: Patient tolerated treatment well Patient left: with call bell/phone within reach;in chair;with chair alarm set  OT Visit Diagnosis: Unsteadiness on feet (R26.81);History of falling (Z91.81);Other abnormalities of gait and mobility (R26.89);Muscle weakness (generalized) (M62.81)                Time: 1610-9604 OT Time Calculation (min): 39 min Charges:  OT General Charges $OT Visit: 1 Visit OT Evaluation $OT Eval Moderate Complexity: 1 Mod OT Treatments $Self Care/Home Management : 8-22 mins  Lise Auer, OT Acute Rehabilitation Services [phone] Office- [phone]     Anslee Micheletti, Karin Golden D 10/10/2022, 3:55 PM

## 2022-10-10 NOTE — Progress Notes (Signed)
PROGRESS NOTE  Perry SeashoreHenry F Matera Jr. MVH:[id number] DOB: [birth year]   PCP: [name], [name], MD  Patient is from: Home.  DOA: 10/09/2022 LOS: 0  Chief complaints Chief Complaint  Patient presents with   Urinary Retention     Brief Narrative / Interim history: 82 year old M with PMH of Alzheimer's dementia, large cell lymphoma, HTN, hypothyroidism, BPH, chronic pain on opiates and GERD presenting with generalized weakness, lethargy and "trouble urinating for the last couple of days, and admitted for possible UTI.  He has leukocytosis to 29,000 with left shift.  UA concerning for UTI.  Foley catheter was placed and patient was started on IV ceftriaxone.  Urine culture and blood culture pending.   Subjective: Seen and examined earlier this morning.  No major events overnight of this morning.  No complaints but not a reliable historian.  He is oriented to self and place but no insight into why he is in the hospital.  He states he is in the hospital due to right foot drop.  Denies pain.  Objective: Vitals:   10/09/22 1745 10/09/22 2207 10/10/22 0148 10/10/22 0547  BP: 131/83 118/73 120/85 117/76  Pulse: 91 98 93 (!) 106  Resp: 18 16 16 16   Temp:  98.1 F (36.7 C) 100.1 F (37.8 C) 99.3 F (37.4 C)  TempSrc:  Oral Oral Oral  SpO2: 99% 95% 93% 95%  Weight: 72.3 kg     Height: 6' (1.829 m)       Examination:  GENERAL: No apparent distress.  Nontoxic. HEENT: MMM.  Vision and hearing grossly intact.  NECK: Supple.  No apparent JVD.  RESP:  No IWOB.  Fair aeration bilaterally. CVS:  RRR. Heart sounds normal.  ABD/GI/GU: BS+. Abd soft, NTND.  Indwelling Foley in place. MSK/EXT:  Moves extremities. No apparent deformity. No edema.  SKIN: no apparent skin lesion or wound NEURO: Awake and alert.  Oriented to self and place.  Limited insight.  No apparent focal neuro deficit. PSYCH: Calm. Normal affect.   Procedures:  None  Microbiology summarized: Blood cultures  NGTD Urine culture pending  Assessment and plan: Principal Problem:   UTI (urinary tract infection) Active Problems:   GERD   BPH associated with nocturia   Lymphoma, large cell (HCC)   Hypothyroidism   Dementia due to Alzheimer's disease   Chronic pain   Generalized weakness   Bandemia   At risk for polypharmacy  Possible urinary tract infection: Presents with urinary tension, leukocytosis, abnormal UA with large LE, positive nitrite, small Hgb and rare bacteria.  Foley catheter inserted. -Continue IV ceftriaxone pending cultures. -Continue Foley catheter -Continue home Flomax -Discontinue oxybutynin. -Voiding trial prior to discharge  Chronic pain on opiates -Continue home Norco -Bowel regimen to counteract constipation  Also has dementia without behavioral disturbance: Awake and alert and oriented x 2. -Reorientation and delirium precaution -Continue home Aricept  Leukocytosis/bandemia: Seems worse despite antibiotics.  Has history of large cell lymphoma in remission per oncology note in 2022.  WBC was normal about a year ago. -Hematology consult if no improvement in the next 24 hours   GERD -Continue PPI  Hypothyroidism -Continue Synthroid  At risk for polypharmacy: On Norco, Elavil, Ativan  Lethargy/generalized weakness: Multifactorial. -Treat treatable causes -PT/OT   Body mass index is 21.62 kg/m.          DVT prophylaxis:  enoxaparin (LOVENOX) injection 40 mg Start: 10/09/22 2200 SCDs Start: 10/09/22 1640  Code Status: Full code Family Communication: None at  bedside Level of care: Med-Surg Status is: Observation The patient will require care spanning > 2 midnights and should be moved to inpatient because: UTI requiring IV antibiotics   Final disposition: TBD Consultants:  None  55 minutes with more than 50% spent in reviewing records, counseling patient/family and coordinating care.   Sch Meds:  Scheduled Meds:  amitriptyline  25 mg  Oral QHS   Chlorhexidine Gluconate Cloth  6 each Topical Daily   donepezil  10 mg Oral QHS   enoxaparin (LOVENOX) injection  40 mg Subcutaneous Q24H   levothyroxine  25 mcg Oral Q0600   phenazopyridine  100 mg Oral TID WC   potassium chloride  40 mEq Oral Once   senna-docusate  1 tablet Oral Daily   tamsulosin  0.4 mg Oral QPC supper   Continuous Infusions:  cefTRIAXone (ROCEPHIN)  IV     PRN Meds:.acetaminophen **OR** acetaminophen, albuterol, HYDROcodone-acetaminophen, LORazepam, oxymetazoline, polyethylene glycol, polyvinyl alcohol  Antimicrobials: Anti-infectives (From admission, onward)    Start     Dose/Rate Route Frequency Ordered Stop   10/10/22 1500  cefTRIAXone (ROCEPHIN) 1 g in sodium chloride 0.9 % 100 mL IVPB        1 g 200 mL/hr over 30 Minutes Intravenous Every 24 hours 10/09/22 1647     10/09/22 1500  cefTRIAXone (ROCEPHIN) 1 g in sodium chloride 0.9 % 100 mL IVPB        1 g 200 mL/hr over 30 Minutes Intravenous  Once 10/09/22 1454 10/09/22 1758        I have personally reviewed the following labs and images: CBC: Recent Labs  Lab 10/09/22 1302 10/10/22 0647  WBC 28.8* 33.0*  NEUTROABS 22.4*  --   HGB 14.8 14.0  HCT 43.6 42.2  MCV 96.0 98.4  PLT 162 148*   BMP &GFR Recent Labs  Lab 10/09/22 1302 10/10/22 0647  NA 140 138  K 3.7 3.5  CL 106 106  CO2 24 24  GLUCOSE 111* 111*  BUN 11 13  CREATININE 1.01 0.96  CALCIUM 8.6* 8.3*   Estimated Creatinine Clearance: 61.7 mL/min (by C-G formula based on SCr of 0.96 mg/dL). Liver & Pancreas: No results for input(s): "AST", "ALT", "ALKPHOS", "BILITOT", "PROT", "ALBUMIN" in the last 168 hours. No results for input(s): "LIPASE", "AMYLASE" in the last 168 hours. No results for input(s): "AMMONIA" in the last 168 hours. Diabetic: No results for input(s): "HGBA1C" in the last 72 hours. No results for input(s): "GLUCAP" in the last 168 hours. Cardiac Enzymes: No results for input(s): "CKTOTAL", "CKMB",  "CKMBINDEX", "TROPONINI" in the last 168 hours. No results for input(s): "PROBNP" in the last 8760 hours. Coagulation Profile: No results for input(s): "INR", "PROTIME" in the last 168 hours. Thyroid Function Tests: No results for input(s): "TSH", "T4TOTAL", "FREET4", "T3FREE", "THYROIDAB" in the last 72 hours. Lipid Profile: No results for input(s): "CHOL", "HDL", "LDLCALC", "TRIG", "CHOLHDL", "LDLDIRECT" in the last 72 hours. Anemia Panel: No results for input(s): "VITAMINB12", "FOLATE", "FERRITIN", "TIBC", "IRON", "RETICCTPCT" in the last 72 hours. Urine analysis:    Component Value Date/Time   COLORURINE YELLOW 10/09/2022 1312   APPEARANCEUR HAZY (A) 10/09/2022 1312   LABSPEC 1.011 10/09/2022 1312   PHURINE 5.0 10/09/2022 1312   GLUCOSEU NEGATIVE 10/09/2022 1312   HGBUR SMALL (A) 10/09/2022 1312   BILIRUBINUR NEGATIVE 10/09/2022 1312   BILIRUBINUR Negative 03/30/2018 1202   KETONESUR NEGATIVE 10/09/2022 1312   PROTEINUR NEGATIVE 10/09/2022 1312   UROBILINOGEN 0.2 03/30/2018 1202   UROBILINOGEN 0.2  04/21/2015 1439   NITRITE POSITIVE (A) 10/09/2022 1312   LEUKOCYTESUR LARGE (A) 10/09/2022 1312   Sepsis Labs: Invalid input(s): "PROCALCITONIN", "LACTICIDVEN"  Microbiology: Recent Results (from the past 240 hour(s))  Culture, blood (routine x 2)     Status: None (Preliminary result)   Collection Time: 10/09/22  1:50 PM   Specimen: BLOOD RIGHT FOREARM  Result Value Ref Range Status   Specimen Description   Final    BLOOD RIGHT FOREARM Performed at [facility], [address], [place], [place] [address]    Special Requests   Final    BOTTLES DRAWN AEROBIC AND ANAEROBIC Blood Culture adequate volume Performed at [facility], [address], [place], [place] [address]    Culture   Final    NO GROWTH < 24 HOURS Performed at [facility], [address], [place], [place] [address]    Report Status PENDING  Incomplete  Culture, blood (routine  x 2)     Status: None (Preliminary result)   Collection Time: 10/09/22  3:50 PM   Specimen: BLOOD  Result Value Ref Range Status   Specimen Description   Final    BLOOD RIGHT ANTECUBITAL Performed at [facility], [address] [place], [place] [address]    Special Requests   Final    BOTTLES DRAWN AEROBIC AND ANAEROBIC Blood Culture adequate volume Performed at [facility], [address] [place], [place] [address]    Culture   Final    NO GROWTH < 24 HOURS Performed at [facility], [address], [place], [place] [address]    Report Status PENDING  Incomplete    Radiology Studies: CT Head Wo Contrast  Result Date: 10/09/2022 CLINICAL DATA:  TIA EXAM: CT HEAD WITHOUT CONTRAST TECHNIQUE: Contiguous axial images were obtained from the base of the skull through the vertex without intravenous contrast. RADIATION DOSE REDUCTION: This exam was performed according to the departmental dose-optimization program which includes automated exposure control, adjustment of the mA and/or kV according to patient size and/or use of iterative reconstruction technique. COMPARISON:  03/24/2015 CT head, correlation is also made with 01/16/2022 MRI head FINDINGS: Brain: No evidence of acute infarction, hemorrhage, mass, mass effect, or midline shift. No hydrocephalus or extra-axial fluid collection. Periventricular white matter changes, likely the sequela of chronic small vessel ischemic disease. Age related cerebral atrophy. Vascular: No hyperdense vessel. Skull: Negative for fracture or focal lesion. Sinuses/Orbits: No acute finding. Status post bilateral lens replacements. Other: The mastoid air cells are well aerated. IMPRESSION: No acute intracranial process. Electronically Signed   By: Wiliam Ke M.D.   On: 10/09/2022 22:07      Gloria Ricardo T. Emanuelle Bastos [facility]  If 7PM-7AM, please contact night-coverage [email or web address] 10/10/2022, 11:04 AM

## 2022-10-11 ENCOUNTER — Inpatient Hospital Stay (HOSPITAL_COMMUNITY): Payer: Medicare Other

## 2022-10-11 DIAGNOSIS — Z9189 Other specified personal risk factors, not elsewhere classified: Secondary | ICD-10-CM | POA: Diagnosis not present

## 2022-10-11 DIAGNOSIS — R531 Weakness: Secondary | ICD-10-CM | POA: Diagnosis not present

## 2022-10-11 DIAGNOSIS — D72825 Bandemia: Secondary | ICD-10-CM | POA: Diagnosis not present

## 2022-10-11 DIAGNOSIS — I4891 Unspecified atrial fibrillation: Secondary | ICD-10-CM | POA: Insufficient documentation

## 2022-10-11 DIAGNOSIS — N3000 Acute cystitis without hematuria: Secondary | ICD-10-CM | POA: Diagnosis not present

## 2022-10-11 LAB — CBC WITH DIFFERENTIAL/PLATELET
Abs Immature Granulocytes: 0 10*3/uL (ref 0.00–0.07)
Basophils Absolute: 0 10*3/uL (ref 0.0–0.1)
Basophils Relative: 0 %
Eosinophils Absolute: 0.3 10*3/uL (ref 0.0–0.5)
Eosinophils Relative: 1 %
HCT: 42.5 % (ref 39.0–52.0)
Hemoglobin: 13.8 g/dL (ref 13.0–17.0)
Lymphocytes Relative: 5 %
Lymphs Abs: 1.3 10*3/uL (ref 0.7–4.0)
MCH: 32.2 pg (ref 26.0–34.0)
MCHC: 32.5 g/dL (ref 30.0–36.0)
MCV: 99.3 fL (ref 80.0–100.0)
Monocytes Absolute: 2.3 10*3/uL — ABNORMAL HIGH (ref 0.1–1.0)
Monocytes Relative: 9 %
Neutro Abs: 21.4 10*3/uL — ABNORMAL HIGH (ref 1.7–7.7)
Neutrophils Relative %: 85 %
Platelets: 143 10*3/uL — ABNORMAL LOW (ref 150–400)
RBC: 4.28 MIL/uL (ref 4.22–5.81)
RDW: 14.8 % (ref 11.5–15.5)
WBC: 25.2 10*3/uL — ABNORMAL HIGH (ref 4.0–10.5)
nRBC: 0 % (ref 0.0–0.2)

## 2022-10-11 LAB — ECHOCARDIOGRAM COMPLETE
AR max vel: 3.03 cm2
AV Area VTI: 2.78 cm2
AV Area mean vel: 2.98 cm2
AV Mean grad: 2 mmHg
AV Peak grad: 4.5 mmHg
AV Vena cont: 0.2 cm
Ao pk vel: 1.06 m/s
Area-P 1/2: 3.72 cm2
Height: 72 in
S' Lateral: 2.4 cm
Weight: 2550.28 oz

## 2022-10-11 LAB — RENAL FUNCTION PANEL
Albumin: 3.5 g/dL (ref 3.5–5.0)
Anion gap: 10 (ref 5–15)
BUN: 14 mg/dL (ref 8–23)
CO2: 27 mmol/L (ref 22–32)
Calcium: 8.6 mg/dL — ABNORMAL LOW (ref 8.9–10.3)
Chloride: 100 mmol/L (ref 98–111)
Creatinine, Ser: 1.1 mg/dL (ref 0.61–1.24)
GFR, Estimated: >60 mL/min (ref >60)
Glucose, Bld: 113 mg/dL — ABNORMAL HIGH (ref 70–99)
Phosphorus: 1.9 mg/dL — ABNORMAL LOW (ref 2.5–4.6)
Potassium: 3.7 mmol/L (ref 3.5–5.1)
Sodium: 137 mmol/L (ref 135–145)

## 2022-10-11 LAB — TSH: TSH: 4.094 u[IU]/mL (ref 0.350–4.500)

## 2022-10-11 LAB — CULTURE, BLOOD (ROUTINE X 2): Special Requests: ADEQUATE

## 2022-10-11 LAB — MAGNESIUM: Magnesium: 1.9 mg/dL (ref 1.7–2.4)

## 2022-10-11 MED ORDER — SODIUM CHLORIDE 0.9 % IV BOLUS
500.0000 mL | Freq: Once | INTRAVENOUS | Status: AC
  Administered 2022-10-11: 500 mL via INTRAVENOUS

## 2022-10-11 MED ORDER — METOPROLOL TARTRATE 25 MG PO TABS
12.5000 mg | ORAL_TABLET | Freq: Two times a day (BID) | ORAL | Status: DC
  Administered 2022-10-11 – 2022-10-13 (×5): 12.5 mg via ORAL
  Filled 2022-10-11 (×5): qty 1

## 2022-10-11 MED ORDER — BETHANECHOL CHLORIDE 10 MG PO TABS: 10.0000 mg | ORAL_TABLET | Freq: Three times a day (TID) | ORAL | Status: DC

## 2022-10-11 NOTE — Progress Notes (Addendum)
Patient frequently requesting to go to the bathroom to urinate. Only going a couple dribbles. States he is having pain in his bladder area and it burns when he tries to urinate. Dr. Alanda Slim notified. He ordered to continue to bladder scan and monitor urine output. Bladderscan @1550 -258 ml. Dr. Alanda Slim notified. Patient continues to go to the rest room frequently. Normal Saline bolus 500 ML given. Encouraging patient to increase fluid intake, he's not drinking much. Bed alarm remains on.

## 2022-10-11 NOTE — Progress Notes (Signed)
Occupational Therapy Treatment Patient Details Name: Perry Nielsen. MRN: [id number] DOB: October 30, 1940 Today's Date: 10/11/2022   History of present illness Pt is an 82yo male presenting to [facility] ED on 4/5 with UTI.   PMH: dementia, BPH, large cell lymphoma, HTN, hypothyroidism, back pain, GERD, L knee pain, R-RCR, R foot drop   OT comments   Pt very pleasant. Pt is distracted by needing to often urinate. Worked on using urinal in the chair     Recommendations for follow up therapy are one component of a multi-disciplinary discharge planning process, led by the attending physician.  Recommendations may be updated based on patient status, additional functional criteria and insurance authorization.    Assistance Recommended at Discharge Frequent or constant Supervision/Assistance  Patient can return home with the following  A little help with walking and/or transfers;A little help with bathing/dressing/bathroom;Assistance with cooking/housework   Equipment Recommendations  BSC/3in1    Recommendations for Other Services      Precautions / Restrictions Precautions Precautions: Fall Precaution Comments: hx of falls Restrictions Weight Bearing Restrictions: No       Mobility Bed Mobility Overal bed mobility: Needs Assistance Bed Mobility: Supine to Sit     Supine to sit: Min assist          Transfers Overall transfer level: Needs assistance Equipment used: Rolling walker (2 wheels)   Sit to Stand: Min assist           General transfer comment: Educated RN pt does better with mobility with AFO     Balance Overall balance assessment: Needs assistance, History of Falls Sitting-balance support: Feet supported, No upper extremity supported Sitting balance-Leahy Scale: Good     Standing balance support: Reliant on assistive device for balance, During functional activity, Bilateral upper extremity supported Standing balance-Leahy Scale: Poor                              ADL either performed or assessed with clinical judgement   ADL Overall ADL's : Needs assistance/impaired Eating/Feeding: Set up;Sitting   Grooming: Set up;Sitting;Oral care;Wash/dry face;Wash/dry hands   Upper Body Bathing: Set up;Sitting   Lower Body Bathing: Sit to/from stand;Cueing for safety;Cueing for sequencing;Maximal assistance   Upper Body Dressing : Set up;Sitting   Lower Body Dressing: Sit to/from stand;Cueing for safety;Cueing for sequencing;Maximal assistance       Toileting- Clothing Manipulation and Hygiene: Maximal assistance;Sit to/from stand         General ADL Comments: pt so pleasant and willing to participate.  Depending on progress may need increased level of care    Extremity/Trunk Assessment     Lower Extremity Assessment Lower Extremity Assessment: Generalized weakness        Vision Patient Visual Report: No change from baseline            Cognition Arousal/Alertness: Awake/alert Behavior During Therapy: WFL for tasks assessed/performed Overall Cognitive Status: Within Functional Limits for tasks assessed                                                     Pertinent Vitals/ Pain       Pain Assessment Pain Score: 4  Pain Location: penis/bladder Pain Descriptors / Indicators: Discomfort Pain Intervention(s): Limited activity within patient's tolerance  Frequency  Min 2X/week           Plan Discharge plan remains appropriate       AM-PAC OT "6 Clicks" Daily Activity     Outcome Measure   Help from another person eating meals?: None Help from another person taking care of personal grooming?: A Little Help from another person toileting, which includes using toliet, bedpan, or urinal?: A Lot Help from another person bathing (including washing, rinsing, drying)?: A Lot Help from another person to put on and taking off regular upper body clothing?: A Little Help from another person  to put on and taking off regular lower body clothing?: A Lot 6 Click Score: 16    End of Session Equipment Utilized During Treatment: Rolling walker (2 wheels)  OT Visit Diagnosis: Unsteadiness on feet (R26.81);History of falling (Z91.81);Other abnormalities of gait and mobility (R26.89);Muscle weakness (generalized) (M62.81)   Activity Tolerance Patient tolerated treatment well   Patient Left with call bell/phone within reach;with chair alarm set;in bed   Nurse Communication Mobility status        Time: 1310-1330 OT Time Calculation (min): 20 min  Charges: OT General Charges $OT Visit: 1 Visit OT Treatments $Self Care/Home Management : 8-22 mins  Lise Auer, [place]  [phone]     Einar Crow D 10/11/2022, 2:32 PM

## 2022-10-11 NOTE — Progress Notes (Addendum)
PROGRESS NOTE  Perry SeashoreHenry F Voth Jr. Perry Nielsen DOB: Nov 17, 1940   PCP: [name], [name], MD  Patient is from: Home.  DOA: 10/09/2022 LOS: 1  Chief complaints Chief Complaint  Patient presents with   Urinary Retention     Brief Narrative / Interim history: 82 year old M with PMH of Alzheimer's dementia, large cell lymphoma, HTN, hypothyroidism, BPH, chronic pain on opiates and GERD presenting with generalized weakness, lethargy and "trouble urinating for the last couple of days, and admitted for possible UTI.  He has leukocytosis to 29,000 with left shift.  UA concerning for UTI.  Foley catheter was placed and patient was started on IV ceftriaxone.    Blood cultures NGTD.  Urine culture with E. coli.  Patient remains on IV ceftriaxone.  Patient had discomfort with Foley catheter, hence removed.  Started on bethanechol.   Continues to report sense of urination but minimal urine output.  Bladder scan minimal as well.  Bethanechol discontinued.   Subjective: Seen and examined earlier this morning.  No major events overnight of this morning.  Foley catheter removed yesterday.  Patient reports sense of frequent urination but minimal urine output.  No other complaints.  Objective: Vitals:   10/10/22 0547 10/10/22 1427 10/10/22 2125 10/11/22 0545  BP: 117/76 109/74 103/67 117/75  Pulse: (!) 106 94 93 88  Resp: 16 15 18 18   Temp: 99.3 F (37.4 C) 99 F (37.2 C) 98 F (36.7 C) 98.4 F (36.9 C)  TempSrc: Oral Oral Oral Oral  SpO2: 95% 97% 96% 96%  Weight:      Height:        Examination: GENERAL: No apparent distress.  Nontoxic. HEENT: MMM.  Vision and hearing grossly intact.  NECK: Supple.  No apparent JVD.  RESP:  No IWOB.  Fair aeration bilaterally. CVS: Irregular rhythm.  Fast heart rate.  Heart sounds normal.  ABD/GI/GU: BS+. Abd soft.  Mild suprapubic tenderness. MSK/EXT:   No apparent deformity. Moves extremities. No edema.  SKIN: no apparent skin lesion or  wound NEURO: Awake and alert. Oriented to self and place.  Limited insight.  No apparent focal neuro deficit. PSYCH: Calm. Normal affect.   Procedures:  None  Microbiology summarized: Blood cultures NGTD Urine culture with E. coli.  Assessment and plan: Principal Problem:   UTI (urinary tract infection) Active Problems:   GERD   BPH associated with nocturia   Lymphoma, large cell (HCC)   Hypothyroidism   Dementia due to Alzheimer's disease   Chronic pain   Generalized weakness   Bandemia   At risk for polypharmacy  E. coli UTI: Presents with urinary tension, leukocytosis, abnormal UA with large LE, positive nitrite, small Hgb and rare bacteria.  Foley catheter inserted.  Urine culture with E. coli. -Continue IV ceftriaxone pending culture sensitivity -Foley catheter removed due to discomfort and pain.  Started on bethanechol. Now with frequent sense of urination after removal of Foley.  Discontinued bethanechol.  May have to resume home oxybutynin if symptoms persist. -Continue bladder scan  New onset atrial fibrillation with RVR: Does not seem to have history of this.  EKG in 2019 sinus rhythm.  Has not had another EKG since then.  He is currently in mild RVR.  CHA2DS2-VASc score 2 for age.  -Start low-dose metoprolol. -Defer anticoagulation given his age, dementia and risk for fall. -Cardiac monitor -Optimize electrolytes -Check TSH and echocardiogram  Chronic pain on opiates -Continue home Norco -Bowel regimen to counteract constipation  Also has dementia  without behavioral disturbance: Awake and alert and oriented x 2. -Reorientation and delirium precaution -Continue home Aricept  Leukocytosis/bandemia: Has history of large cell lymphoma in remission per oncology note in 2022.  WBC was normal about a year ago.  Seems to be improving. -Hematology consult if no improvement in the next 24 hours   GERD -Continue PPI  Hypothyroidism -Continue Synthroid  At risk  for polypharmacy: On Norco, Elavil, Ativan  Lethargy/generalized weakness: Multifactorial. -Treat treatable causes -PT/OT   Body mass index is 21.62 kg/m.          DVT prophylaxis:  enoxaparin (LOVENOX) injection 40 mg Start: 10/09/22 2200 SCDs Start: 10/09/22 1640  Code Status: Full code Family Communication: Attempted to call patient's wife for update but no answer. Level of care: Telemetry Status is: Inpatient The patient will remain inpatient because: UTI requiring IV antibiotics, new onset A-fib with RVR   Final disposition: TBD Consultants:  None  55 minutes with more than 50% spent in reviewing records, counseling patient/family and coordinating care.   Sch Meds:  Scheduled Meds:  amitriptyline  25 mg Oral QHS   donepezil  10 mg Oral QHS   enoxaparin (LOVENOX) injection  40 mg Subcutaneous Q24H   levothyroxine  25 mcg Oral Q0600   metoprolol tartrate  12.5 mg Oral BID   phenazopyridine  100 mg Oral TID WC   senna-docusate  1 tablet Oral Daily   tamsulosin  0.4 mg Oral QPC supper   Continuous Infusions:  cefTRIAXone (ROCEPHIN)  IV Stopped (10/10/22 1510)   PRN Meds:.acetaminophen **OR** acetaminophen, albuterol, HYDROcodone-acetaminophen, LORazepam, oxymetazoline, polyethylene glycol, polyvinyl alcohol  Antimicrobials: Anti-infectives (From admission, onward)    Start     Dose/Rate Route Frequency Ordered Stop   10/10/22 1500  cefTRIAXone (ROCEPHIN) 1 g in sodium chloride 0.9 % 100 mL IVPB        1 g 200 mL/hr over 30 Minutes Intravenous Every 24 hours 10/09/22 1647     10/09/22 1500  cefTRIAXone (ROCEPHIN) 1 g in sodium chloride 0.9 % 100 mL IVPB        1 g 200 mL/hr over 30 Minutes Intravenous  Once 10/09/22 1454 10/09/22 1758        I have personally reviewed the following labs and images: CBC: Recent Labs  Lab 10/09/22 1302 10/10/22 0647 10/11/22 0533  WBC 28.8* 33.0* 25.2*  NEUTROABS 22.4*  --  21.4*  HGB 14.8 14.0 13.8  HCT 43.6 42.2  42.5  MCV 96.0 98.4 99.3  PLT 162 148* 143*   BMP &GFR Recent Labs  Lab 10/09/22 1302 10/10/22 0647  NA 140 138  K 3.7 3.5  CL 106 106  CO2 24 24  GLUCOSE 111* 111*  BUN 11 13  CREATININE 1.01 0.96  CALCIUM 8.6* 8.3*   Estimated Creatinine Clearance: 61.7 mL/min (by C-G formula based on SCr of 0.96 mg/dL). Liver & Pancreas: No results for input(s): "AST", "ALT", "ALKPHOS", "BILITOT", "PROT", "ALBUMIN" in the last 168 hours. No results for input(s): "LIPASE", "AMYLASE" in the last 168 hours. No results for input(s): "AMMONIA" in the last 168 hours. Diabetic: No results for input(s): "HGBA1C" in the last 72 hours. No results for input(s): "GLUCAP" in the last 168 hours. Cardiac Enzymes: No results for input(s): "CKTOTAL", "CKMB", "CKMBINDEX", "TROPONINI" in the last 168 hours. No results for input(s): "PROBNP" in the last 8760 hours. Coagulation Profile: No results for input(s): "INR", "PROTIME" in the last 168 hours. Thyroid Function Tests: No results for input(s): "TSH", "  T4TOTAL", "FREET4", "T3FREE", "THYROIDAB" in the last 72 hours. Lipid Profile: No results for input(s): "CHOL", "HDL", "LDLCALC", "TRIG", "CHOLHDL", "LDLDIRECT" in the last 72 hours. Anemia Panel: No results for input(s): "VITAMINB12", "FOLATE", "FERRITIN", "TIBC", "IRON", "RETICCTPCT" in the last 72 hours. Urine analysis:    Component Value Date/Time   COLORURINE YELLOW 10/09/2022 1312   APPEARANCEUR HAZY (A) 10/09/2022 1312   LABSPEC 1.011 10/09/2022 1312   PHURINE 5.0 10/09/2022 1312   GLUCOSEU NEGATIVE 10/09/2022 1312   HGBUR SMALL (A) 10/09/2022 1312   BILIRUBINUR NEGATIVE 10/09/2022 1312   BILIRUBINUR Negative 03/30/2018 1202   KETONESUR NEGATIVE 10/09/2022 1312   PROTEINUR NEGATIVE 10/09/2022 1312   UROBILINOGEN 0.2 03/30/2018 1202   UROBILINOGEN 0.2 04/21/2015 1439   NITRITE POSITIVE (A) 10/09/2022 1312   LEUKOCYTESUR LARGE (A) 10/09/2022 1312   Sepsis Labs: Invalid input(s):  "PROCALCITONIN", "LACTICIDVEN"  Microbiology: Recent Results (from the past 240 hour(s))  Urine Culture     Status: Abnormal (Preliminary result)   Collection Time: 10/09/22  1:12 PM   Specimen: Urine, Catheterized  Result Value Ref Range Status   Specimen Description   Final    URINE, CATHETERIZED Performed at [facility], [address] [place], [place] [address]    Special Requests   Final    NONE Performed at [facility], [address] [place], [place] [address]    Culture (A)  Final    >=100,000 COLONIES/mL ESCHERICHIA COLI SUSCEPTIBILITIES TO FOLLOW Performed at [facility], [address], [place], [place] [address]    Report Status PENDING  Incomplete  Culture, blood (routine x 2)     Status: None (Preliminary result)   Collection Time: 10/09/22  1:50 PM   Specimen: BLOOD RIGHT FOREARM  Result Value Ref Range Status   Specimen Description   Final    BLOOD RIGHT FOREARM Performed at [facility], [address], [place], [place] [address]    Special Requests   Final    BOTTLES DRAWN AEROBIC AND ANAEROBIC Blood Culture adequate volume Performed at [facility], [address] [place], [place] [address]    Culture   Final    NO GROWTH 2 DAYS Performed at [facility], [address] [place], [place] [address]    Report Status PENDING  Incomplete  Culture, blood (routine x 2)     Status: None (Preliminary result)   Collection Time: 10/09/22  3:50 PM   Specimen: BLOOD  Result Value Ref Range Status   Specimen Description   Final    BLOOD RIGHT ANTECUBITAL Performed at [facility], [address], [place], [place] [address]    Special Requests   Final    BOTTLES DRAWN AEROBIC AND ANAEROBIC Blood Culture adequate volume Performed at [facility], [address], [place], [place] [address]    Culture   Final    NO GROWTH 2 DAYS Performed at  [facility], [address] [place], [place] [address]    Report Status PENDING  Incomplete    Radiology Studies: No results found.    Kylin Genna T. Viren Lebeau [facility]  If 7PM-7AM, please contact night-coverage [email or web address] 10/11/2022, 12:22 PM

## 2022-10-12 DIAGNOSIS — R531 Weakness: Secondary | ICD-10-CM | POA: Diagnosis not present

## 2022-10-12 DIAGNOSIS — R338 Other retention of urine: Secondary | ICD-10-CM

## 2022-10-12 DIAGNOSIS — N3000 Acute cystitis without hematuria: Secondary | ICD-10-CM | POA: Diagnosis not present

## 2022-10-12 DIAGNOSIS — Z9189 Other specified personal risk factors, not elsewhere classified: Secondary | ICD-10-CM | POA: Diagnosis not present

## 2022-10-12 DIAGNOSIS — D72825 Bandemia: Secondary | ICD-10-CM | POA: Diagnosis not present

## 2022-10-12 LAB — CBC WITH DIFFERENTIAL/PLATELET
Abs Immature Granulocytes: 0.07 10*3/uL (ref 0.00–0.07)
Basophils Absolute: 0.1 10*3/uL (ref 0.0–0.1)
Basophils Relative: 0 %
Eosinophils Absolute: 0.1 10*3/uL (ref 0.0–0.5)
Eosinophils Relative: 1 %
HCT: 37.3 % — ABNORMAL LOW (ref 39.0–52.0)
Hemoglobin: 12.3 g/dL — ABNORMAL LOW (ref 13.0–17.0)
Immature Granulocytes: 0 %
Lymphocytes Relative: 13 %
Lymphs Abs: 2.1 10*3/uL (ref 0.7–4.0)
MCH: 32.3 pg (ref 26.0–34.0)
MCHC: 33 g/dL (ref 30.0–36.0)
MCV: 97.9 fL (ref 80.0–100.0)
Monocytes Absolute: 2.7 10*3/uL — ABNORMAL HIGH (ref 0.1–1.0)
Monocytes Relative: 17 %
Neutro Abs: 11 10*3/uL — ABNORMAL HIGH (ref 1.7–7.7)
Neutrophils Relative %: 69 %
Platelets: 135 10*3/uL — ABNORMAL LOW (ref 150–400)
RBC: 3.81 MIL/uL — ABNORMAL LOW (ref 4.22–5.81)
RDW: 14.4 % (ref 11.5–15.5)
WBC: 15.9 10*3/uL — ABNORMAL HIGH (ref 4.0–10.5)
nRBC: 0 % (ref 0.0–0.2)

## 2022-10-12 LAB — RENAL FUNCTION PANEL
Albumin: 3.1 g/dL — ABNORMAL LOW (ref 3.5–5.0)
Anion gap: 9 (ref 5–15)
BUN: 10 mg/dL (ref 8–23)
CO2: 22 mmol/L (ref 22–32)
Calcium: 8.1 mg/dL — ABNORMAL LOW (ref 8.9–10.3)
Chloride: 106 mmol/L (ref 98–111)
Creatinine, Ser: 0.78 mg/dL (ref 0.61–1.24)
GFR, Estimated: >60 mL/min (ref >60)
Glucose, Bld: 100 mg/dL — ABNORMAL HIGH (ref 70–99)
Phosphorus: 3.1 mg/dL (ref 2.5–4.6)
Potassium: 3.2 mmol/L — ABNORMAL LOW (ref 3.5–5.1)
Sodium: 137 mmol/L (ref 135–145)

## 2022-10-12 LAB — MAGNESIUM: Magnesium: 1.8 mg/dL (ref 1.7–2.4)

## 2022-10-12 LAB — URINE CULTURE: Culture: >=100000 — AB

## 2022-10-12 LAB — CULTURE, BLOOD (ROUTINE X 2): Special Requests: ADEQUATE

## 2022-10-12 MED ORDER — POTASSIUM CHLORIDE CRYS ER 20 MEQ PO TBCR
40.0000 meq | EXTENDED_RELEASE_TABLET | ORAL | Status: AC
  Administered 2022-10-12 (×2): 40 meq via ORAL
  Filled 2022-10-12 (×2): qty 2

## 2022-10-12 MED ORDER — MELATONIN 5 MG PO TABS
5.0000 mg | ORAL_TABLET | Freq: Once | ORAL | Status: AC
  Administered 2022-10-12: 5 mg via ORAL
  Filled 2022-10-12: qty 1

## 2022-10-12 NOTE — TOC Initial Note (Signed)
Transition of Care (TOC) - Initial/Assessment Note    Patient Details  Name: Perry Nielsen. MRN: [id number] Date of Birth: 1941-05-03  Transition of Care [facility]) CM/SW Contact:    Erin Sons, LCSW Phone Number: 10/12/2022, 10:20 AM  Clinical Narrative:                  Pt admitted with UTI, on IV abx. New onset afib w/ RVR. TOC will follow to assist with potential DC needs.        Patient Goals and CMS Choice            Expected Discharge Plan and Services                                              Prior Living Arrangements/Services                       Activities of Daily Living Home Assistive Devices/Equipment: Cane (specify quad or straight) (straight) ADL Screening (condition at time of admission) Patient's cognitive ability adequate to safely complete daily activities?: Yes Is the patient deaf or have difficulty hearing?: No Does the patient have difficulty seeing, even when wearing glasses/contacts?: No Does the patient have difficulty concentrating, remembering, or making decisions?: Yes Patient able to express need for assistance with ADLs?: Yes Does the patient have difficulty dressing or bathing?: Yes Independently performs ADLs?: No Communication: Independent Dressing (OT): Needs assistance (pt states "about a week ago, I started being unable to dress myself properly") Is this a change from baseline?: Pre-admission baseline Grooming: Independent Feeding: Independent Bathing: Needs assistance Is this a change from baseline?: Pre-admission baseline Toileting: Independent In/Out Bed: Independent Walks in Home: Independent (uses a cane at home) Does the patient have difficulty walking or climbing stairs?: Yes Weakness of Legs: Right Weakness of Arms/Hands: None  Permission Sought/Granted                  Emotional Assessment              Admission diagnosis:  Urinary retention [R33.9] UTI (urinary tract  infection) [N39.0] Acute UTI [N39.0] Patient Active Problem List   Diagnosis Date Noted   New onset atrial fibrillation 10/11/2022   Chronic pain 10/10/2022   Generalized weakness 10/10/2022   Bandemia 10/10/2022   At risk for polypharmacy 10/10/2022   UTI (urinary tract infection) 10/09/2022   Mild vascular dementia without behavioral disturbance, psychotic disturbance, mood disturbance, or anxiety 02/02/2022   Dementia due to Alzheimer's disease 01/02/2022   Vitamin B12 deficiency 09/03/2020   Hypothyroidism 09/03/2020   Routine general medical examination at a health care facility 03/23/2017   Need for prophylactic vaccination and inoculation against influenza 03/23/2017   Lymphoma, 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   Foot drop 02/03/2015   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   ED (erectile dysfunction) 08/07/2008   KNEE PAIN, LEFT, CHRONIC 08/07/2008   CAROTID BRUIT, LEFT 08/07/2008   Allergic rhinitis 08/03/2007   DYSFNCT ASSO W/SLEEP STGES/AROUSAL FRM SLEEP 08/03/2007   GERD 02/18/2007   BPH associated with nocturia 02/18/2007   PCP:  Philip Aspen, Limmie Patricia, MD Pharmacy:   [facility] [phone] [place], [place] - [address] AT [facility] [address] [facility] [address] [place] [address] Phone: [phone] Fax: [phone]     Social  Determinants of Health (SDOH) Social History: SDOH Screenings   Food Insecurity: No Food Insecurity (10/09/2022)  Housing: Low Risk  (10/09/2022)  Transportation Needs: No Transportation Needs (10/09/2022)  Utilities: Not At Risk (10/09/2022)  Alcohol Screen: Low Risk  (10/29/2021)  Depression (PHQ2-9): Low Risk  (06/11/2022)  Financial Resource Strain: Low Risk  (03/11/2022)  Physical Activity: Sufficiently Active (10/29/2021)  Social Connections: Socially Integrated (10/29/2021)  Stress: No Stress Concern Present (10/29/2021)  Tobacco Use: Low Risk  (10/09/2022)   SDOH Interventions:     Readmission Risk  Interventions     No data to display

## 2022-10-12 NOTE — Progress Notes (Addendum)
Physical Therapy Treatment Patient Details Name: Perry Nielsen. MRN: [id number] DOB: 1941/01/30 Today's Date: 10/12/2022   History of Present Illness Pt is an 82yo male presenting to [facility] ED on 4/5 with UTI.   PMH: dementia, BPH, large cell lymphoma, HTN, hypothyroidism, back pain, GERD, L knee pain, R-RCR, R foot drop    PT Comments     Pt admitted with above diagnosis.  Pt currently with functional limitations due to the deficits listed below (see PT Problem List). Pt in bed when PT arrived. Pt is c/o of B foot pain R > L, abdominal and pain with urination as well as perseverating on need to void with catheter in place. PT reviewed eval notes with indication of dizziness with gait tasks. Pt c/o dizziness today and assessed for orthostatic hypotension please see below. Pt required S and increased time for supine to sit, pt required min guard for STS from EOB, pt tolerated static standing 4:49 with B UE support at RW and wide BOS, pt required A to don shoes and R AFO, gait tasks in hallway with improved gait pattern and tolerance of 85 feet with RW. PT recommends RW for home setting for safety and stability. Pt is under impression d/c today, however d/c at this time anticipated for 4/9. Pt elected to return to bed required cues for side step to [facility] with use of RW, assist to doff B shoes and R AFO indicating increased pain with trunk flexion. Pt left in bed, all needs in place. PT communicated with nurse in regards to pain report and orthostatics with MD recommendation for compression stockings when OOB. Pt will benefit from acute skilled PT to increase their independence and safety with mobility to allow discharge.    Supine 120/75 PR 73 Seated EOB 124/86 PR 94 Initial standing 110/72 PR 98 Standing s/p 3 mins 99/54 PR 100   Recommendations for follow up therapy are one component of a multi-disciplinary discharge planning process, led by the attending physician.  Recommendations may be updated  based on patient status, additional functional criteria and insurance authorization.  Follow Up Recommendations       Assistance Recommended at Discharge Intermittent Supervision/Assistance  Patient can return home with the following A little help with walking and/or transfers;A little help with bathing/dressing/bathroom;Assistance with cooking/housework;Assist for transportation;Help with stairs or ramp for entrance   Equipment Recommendations  Rolling walker (2 wheels) (pt will benefit from RW in home setting)    Recommendations for Other Services       Precautions / Restrictions Precautions Precautions: Fall Precaution Comments: hx of falls Restrictions Weight Bearing Restrictions: No     Mobility  Bed Mobility Overal bed mobility: Needs Assistance Bed Mobility: Supine to Sit, Sit to Supine     Supine to sit: Supervision Sit to supine: Supervision   General bed mobility comments: increased time    Transfers Overall transfer level: Needs assistance Equipment used: Rolling walker (2 wheels) Transfers: Sit to/from Stand, Bed to chair/wheelchair/BSC Sit to Stand: Min guard           General transfer comment: cues for proper UE placement, pt required A to don shoes and R AFO    Ambulation/Gait Ambulation/Gait assistance: Min guard Gait Distance (Feet): 85 Feet Assistive device: Rolling walker (2 wheels) Gait Pattern/deviations: Decreased step length - right, Decreased step length - left, Decreased dorsiflexion - right (step almost through) Gait velocity: decreased     General Gait Details: R AFO donned   Stairs  Wheelchair Mobility    Modified Rankin (Stroke Patients Only)       Balance Overall balance assessment: Needs assistance, History of Falls Sitting-balance support: Feet supported, No upper extremity supported Sitting balance-Leahy Scale: Good     Standing balance support: Reliant on assistive device for balance, During  functional activity, Bilateral upper extremity supported Standing balance-Leahy Scale: Poor                              Cognition Arousal/Alertness: Awake/alert Behavior During Therapy: WFL for tasks assessed/performed Overall Cognitive Status: Within Functional Limits for tasks assessed                                          Exercises      General Comments        Pertinent Vitals/Pain Pain Assessment Pain Assessment: Faces Faces Pain Scale: Hurts even more Pain Location: penis/bladder and B feet R > L Pain Descriptors / Indicators: Discomfort Pain Intervention(s): Monitored during session, Repositioned    Home Living                          Prior Function            PT Goals (current goals can now be found in the care plan section) Acute Rehab PT Goals Patient Stated Goal: to go home, reduce pain PT Goal Formulation: With patient Time For Goal Achievement: 10/24/22 Potential to Achieve Goals: Good    Frequency    Min 3X/week      PT Plan      Co-evaluation              AM-PAC PT "6 Clicks" Mobility   Outcome Measure  Help needed turning from your back to your side while in a flat bed without using bedrails?: None Help needed moving from lying on your back to sitting on the side of a flat bed without using bedrails?: A Little Help needed moving to and from a bed to a chair (including a wheelchair)?: A Little Help needed standing up from a chair using your arms (e.g., wheelchair or bedside chair)?: A Little Help needed to walk in hospital room?: A Little Help needed climbing 3-5 steps with a railing? : A Little 6 Click Score: 19    End of Session Equipment Utilized During Treatment: Gait belt Activity Tolerance: Patient tolerated treatment well;No increased pain Patient left: in bed;with call bell/phone within reach;with chair alarm set Nurse Communication: Mobility status PT Visit Diagnosis:  Difficulty in walking, not elsewhere classified (R26.2);History of falling (Z91.81)     Time: [id number] PT Time Calculation (min) (ACUTE ONLY): 32 min  Charges:  $Gait Training: 8-22 mins $Therapeutic Activity: 8-22 mins                     Rica MoteMartha Haydee Jabbour, PT    Jacqualyn PoseyMartha H Chasta Deshpande 10/12/2022, 11:32 AM

## 2022-10-12 NOTE — Progress Notes (Signed)
PROGRESS NOTE  Perry Nielsen. HCW:[id number] DOB: 1940/08/10   PCP: Philip Aspen, Limmie Patricia, MD  Patient is from: Home.  DOA: 10/09/2022 LOS: 2  Chief complaints Chief Complaint  Patient presents with   Urinary Retention     Brief Narrative / Interim history: 82 year old M with PMH of Alzheimer's dementia, large cell lymphoma, HTN, hypothyroidism, BPH, chronic pain on opiates and GERD presenting with generalized weakness, lethargy and "trouble urinating for the last couple of days, and admitted for possible UTI.  He has leukocytosis to 29,000 with left shift.  UA concerning for UTI.  Foley catheter was placed and patient was started on IV ceftriaxone.    Blood cultures NGTD.  Urine culture with E. coli.  Patient remains on IV ceftriaxone.  Patient had discomfort with Foley catheter, hence removed.  Started on bethanechol.   Continues to report frequent urge to urinate with minimal urine output.  Bethanechol discontinued, and patient was not able to void.  Foley catheter inserted the night of 4/7.   Patient also had new onset A-fib with mild RVR on 4/7 but converted to sinus rhythm with p.o. metoprolol.   Subjective: Seen and examined earlier this morning.  No major events overnight or this morning.  No complaints somewhat anxious.  He feels like his wife and daughter left him here.  He know he is in the hospital but thinks it is because of his right foot drop.  He denies pain.  Foley catheter in place.  Objective: Vitals:   10/11/22 1333 10/11/22 1344 10/11/22 2030 10/12/22 0458  BP: 107/72 103/76 111/72 130/77  Pulse: (!) 107 (!) 57 76 78  Resp:  18 18   Temp:  98.6 F (37 C) 98.9 F (37.2 C) 98.2 F (36.8 C)  TempSrc:  Oral Oral Oral  SpO2:  96% 97% 98%  Weight:      Height:        Examination:  GENERAL: No apparent distress.  Nontoxic. HEENT: MMM.  Vision and hearing grossly intact.  NECK: Supple.  No apparent JVD.  RESP:  No IWOB.  Fair aeration  bilaterally. CVS:  RRR. Heart sounds normal.  ABD/GI/GU: BS+. Abd soft, NTND.  MSK/EXT:   No apparent deformity. Moves extremities. No edema.  SKIN: no apparent skin lesion or wound NEURO: Awake and alert. Oriented to self and place but not time.  Very limited insight.  No apparent focal neuro deficit. PSYCH: Calm. Normal affect.   Procedures:  None  Microbiology summarized: Blood cultures NGTD Urine culture with E. coli.  Assessment and plan: Principal Problem:   UTI (urinary tract infection) Active Problems:   GERD   BPH associated with nocturia   Lymphoma, large cell (HCC)   Hypothyroidism   Dementia due to Alzheimer's disease   Chronic pain   Generalized weakness   Bandemia   At risk for polypharmacy   New onset atrial fibrillation  E. coli UTI: Presents with urinary tension, leukocytosis, abnormal UA with large LE, positive nitrite, small Hgb and rare bacteria.  Foley catheter inserted.  Urine culture with E. coli.  Sensitivity pending. -Continue IV ceftriaxone pending culture sensitivity -Continue Foley catheter until outpatient follow-up with urology  New onset atrial fibrillation with RVR: Confirmed on EKG.  Prior EKG in 2019 sinus rhythm.  Has not had another EKG since then. CHA2DS2-VASc score 2 for age.  Converted to sinus rhythm after low-dose metoprolol.  TSH normal.  TTE without significant finding. -Continue low-dose Toprol -Defer anticoagulation given  his age, dementia and risk for fall. -Optimize electrolytes-p.o. KCl for already every 3 hours x 2  Chronic pain on opiates -Continue home Norco -Bowel regimen to counteract constipation  Also has dementia without behavioral disturbance: Awake and alert and oriented x 2.  Very limited insight -Reorientation and delirium precaution -Continue home Aricept  Leukocytosis/bandemia: Likely due to infection.  Improving. -Continue monitor  Acute urinary retention: Failed voiding trial. -Continue Foley catheter  on discharge.  Outpatient follow-up with urology for voiding trial. -Continue Flomax. -Discontinue oxybutynin   GERD -Continue PPI  Hypothyroidism -Continue Synthroid  At risk for polypharmacy: On Norco, Elavil, Ativan  Lethargy/generalized weakness: Multifactorial. -Treat treatable causes -PT/OT   Body mass index is 21.62 kg/m.          DVT prophylaxis:  enoxaparin (LOVENOX) injection 40 mg Start: 10/09/22 2200 SCDs Start: 10/09/22 1640  Code Status: Full code Family Communication: Updated patient's wife over the phone. Level of care: Telemetry Status is: Inpatient The patient will remain inpatient because: UTI requiring IV antibiotics   Final disposition: Home with home health Consultants:  None  35 minutes with more than 50% spent in reviewing records, counseling patient/family and coordinating care.   Sch Meds:  Scheduled Meds:  amitriptyline  25 mg Oral QHS   donepezil  10 mg Oral QHS   enoxaparin (LOVENOX) injection  40 mg Subcutaneous Q24H   levothyroxine  25 mcg Oral Q0600   metoprolol tartrate  12.5 mg Oral BID   potassium chloride  40 mEq Oral Q3H   senna-docusate  1 tablet Oral Daily   tamsulosin  0.4 mg Oral QPC supper   Continuous Infusions:  cefTRIAXone (ROCEPHIN)  IV 1 g (10/11/22 1508)   PRN Meds:.acetaminophen **OR** acetaminophen, albuterol, HYDROcodone-acetaminophen, LORazepam, oxymetazoline, polyethylene glycol, polyvinyl alcohol  Antimicrobials: Anti-infectives (From admission, onward)    Start     Dose/Rate Route Frequency Ordered Stop   10/10/22 1500  cefTRIAXone (ROCEPHIN) 1 g in sodium chloride 0.9 % 100 mL IVPB        1 g 200 mL/hr over 30 Minutes Intravenous Every 24 hours 10/09/22 1647     10/09/22 1500  cefTRIAXone (ROCEPHIN) 1 g in sodium chloride 0.9 % 100 mL IVPB        1 g 200 mL/hr over 30 Minutes Intravenous  Once 10/09/22 1454 10/09/22 1758        I have personally reviewed the following labs and  images: CBC: Recent Labs  Lab 10/09/22 1302 10/10/22 0647 10/11/22 0533 10/12/22 0442  WBC 28.8* 33.0* 25.2* 15.9*  NEUTROABS 22.4*  --  21.4* 11.0*  HGB 14.8 14.0 13.8 12.3*  HCT 43.6 42.2 42.5 37.3*  MCV 96.0 98.4 99.3 97.9  PLT 162 148* 143* 135*   BMP &GFR Recent Labs  Lab 10/09/22 1302 10/10/22 0647 10/11/22 1312 10/12/22 0442  NA 140 138 137 137  K 3.7 3.5 3.7 3.2*  CL 106 106 100 106  CO2 GLUCOSE 111* 111* 113* 100*  BUN CREATININE 1.01 0.96 1.10 0.78  CALCIUM 8.6* 8.3* 8.6* 8.1*  MG  --   --  1.9 1.8  PHOS  --   --  1.9* 3.1   Estimated Creatinine Clearance: 74.1 mL/min (by C-G formula based on SCr of 0.78 mg/dL). Liver & Pancreas: Recent Labs  Lab 10/11/22 1312 10/12/22 0442  ALBUMIN 3.5 3.1*   No results for input(s): "LIPASE", "AMYLASE" in the last 168 hours. No  results for input(s): "AMMONIA" in the last 168 hours. Diabetic: No results for input(s): "HGBA1C" in the last 72 hours. No results for input(s): "GLUCAP" in the last 168 hours. Cardiac Enzymes: No results for input(s): "CKTOTAL", "CKMB", "CKMBINDEX", "TROPONINI" in the last 168 hours. No results for input(s): "PROBNP" in the last 8760 hours. Coagulation Profile: No results for input(s): "INR", "PROTIME" in the last 168 hours. Thyroid Function Tests: Recent Labs    10/11/22 1312  TSH 4.094   Lipid Profile: No results for input(s): "CHOL", "HDL", "LDLCALC", "TRIG", "CHOLHDL", "LDLDIRECT" in the last 72 hours. Anemia Panel: No results for input(s): "VITAMINB12", "FOLATE", "FERRITIN", "TIBC", "IRON", "RETICCTPCT" in the last 72 hours. Urine analysis:    Component Value Date/Time   COLORURINE YELLOW 10/09/2022 1312   APPEARANCEUR HAZY (A) 10/09/2022 1312   LABSPEC 1.011 10/09/2022 1312   PHURINE 5.0 10/09/2022 1312   GLUCOSEU NEGATIVE 10/09/2022 1312   HGBUR SMALL (A) 10/09/2022 1312   BILIRUBINUR NEGATIVE 10/09/2022 1312   BILIRUBINUR Negative 03/30/2018  1202   KETONESUR NEGATIVE 10/09/2022 1312   PROTEINUR NEGATIVE 10/09/2022 1312   UROBILINOGEN 0.2 03/30/2018 1202   UROBILINOGEN 0.2 04/21/2015 1439   NITRITE POSITIVE (A) 10/09/2022 1312   LEUKOCYTESUR LARGE (A) 10/09/2022 1312   Sepsis Labs: Invalid input(s): "PROCALCITONIN", "LACTICIDVEN"  Microbiology: Recent Results (from the past 240 hour(s))  Urine Culture     Status: Abnormal (Preliminary result)   Collection Time: 10/09/22  1:12 PM   Specimen: Urine, Catheterized  Result Value Ref Range Status   Specimen Description   Final    URINE, CATHETERIZED Performed at [facility], [address] [place], [place] [address]    Special Requests   Final    NONE Performed at [facility], [address] [place], [place] [address]    Culture (A)  Final    >=100,000 COLONIES/mL ESCHERICHIA COLI SUSCEPTIBILITIES TO FOLLOW Performed at [facility], [address] [place], [place] [address]    Report Status PENDING  Incomplete  Culture, blood (routine x 2)     Status: None (Preliminary result)   Collection Time: 10/09/22  1:50 PM   Specimen: BLOOD RIGHT FOREARM  Result Value Ref Range Status   Specimen Description   Final    BLOOD RIGHT FOREARM Performed at [facility], [address] [place], [place] [address]    Special Requests   Final    BOTTLES DRAWN AEROBIC AND ANAEROBIC Blood Culture adequate volume Performed at [facility], [address] [place], [place] [address]    Culture   Final    NO GROWTH 3 DAYS Performed at [facility], [address] [place], [place] [address]    Report Status PENDING  Incomplete  Culture, blood (routine x 2)     Status: None (Preliminary result)   Collection Time: 10/09/22  3:50 PM   Specimen: BLOOD  Result Value Ref Range Status   Specimen Description   Final    BLOOD RIGHT ANTECUBITAL Performed at [facility], [address]  [place], [place] [address]    Special Requests   Final    BOTTLES DRAWN AEROBIC AND ANAEROBIC Blood Culture adequate volume Performed at [facility], [address] [place], [place] [address]    Culture   Final    NO GROWTH 3 DAYS Performed at [facility], [address] [place], [place] [address]    Report  Status PENDING  Incomplete    Radiology Studies: ECHOCARDIOGRAM COMPLETE  Result Date: 10/11/2022    ECHOCARDIOGRAM REPORT   Patient Name:   Perry Nielsen. Date of Exam: 10/11/2022 Medical Rec #:  [id number]            Height:       72.0 in Accession #:    [id number]           Weight:       159.4 lb Date of Birth:  06/15/1941            BSA:          1.935 m Patient Age:    82 years             BP:           103/76 mmHg Patient Gender: M                    HR:           97 bpm. Exam Location:  Inpatient Procedure: 2D Echo, Cardiac Doppler and Color Doppler Indications:    I48.91 A-fib  History:        Patient has prior history of Echocardiogram examinations, most                 recent 03/08/2015. Arrythmias:Atrial Fibrillation; Risk                 Factors:Non-Smoker.  Sonographer:    Dondra Prader RVT RCS Referring Phys: [id number] Boyce Medici Lila Lufkin  Sonographer Comments: Technically challenging study due to limited acoustic windows, Technically difficult study due to poor echo windows, suboptimal parasternal window, suboptimal apical window and no subcostal window. IMPRESSIONS  1. Left ventricular ejection fraction, by estimation, is 60 to 65%. The left ventricle has normal function. The left ventricle has no regional wall motion abnormalities. Left ventricular diastolic parameters are consistent with Grade I diastolic dysfunction (impaired relaxation).  2. Right ventricular systolic function is normal. The right ventricular size is mildly enlarged.  3. The mitral valve is normal in structure. No evidence of mitral valve regurgitation. No evidence of mitral stenosis.  4. The aortic  valve is normal in structure. There is mild calcification of the aortic valve. Aortic valve regurgitation is trivial. Aortic valve sclerosis/calcification is present, without any evidence of aortic stenosis.  5. The inferior vena cava is normal in size with greater than 50% respiratory variability, suggesting right atrial pressure of 3 mmHg. Comparison(s): Prior images unable to be directly viewed, comparison made by report only. Conclusion(s)/Recommendation(s): The patient's rhythm converted from atrial fibrillation to normal sinus rhythm during the echo study. FINDINGS  Left Ventricle: Left ventricular ejection fraction, by estimation, is 60 to 65%. The left ventricle has normal function. The left ventricle has no regional wall motion abnormalities. The left ventricular internal cavity size was normal in size. There is  no left ventricular hypertrophy. Left ventricular diastolic parameters are consistent with Grade I diastolic dysfunction (impaired relaxation). Right Ventricle: The right ventricular size is mildly enlarged. No increase in right ventricular wall thickness. Right ventricular systolic function is normal. Left Atrium: Left atrial size was normal in size. Right Atrium: Right atrial size was normal in size. Prominent Eustachian valve. Pericardium: There is no evidence of pericardial effusion. Mitral Valve: The mitral valve is normal in structure. Mild mitral annular calcification. No evidence of mitral valve regurgitation. No evidence of mitral valve stenosis. Tricuspid Valve: The tricuspid valve is normal  in structure. Tricuspid valve regurgitation is mild . No evidence of tricuspid stenosis. Aortic Valve: The aortic valve is normal in structure. There is mild calcification of the aortic valve. Aortic valve regurgitation is trivial. Aortic valve sclerosis/calcification is present, without any evidence of aortic stenosis. Aortic valve mean gradient measures 2.0 mmHg. Aortic valve peak gradient measures  4.5 mmHg. Aortic valve area, by VTI measures 2.78 cm. Pulmonic Valve: The pulmonic valve was normal in structure. Pulmonic valve regurgitation is not visualized. No evidence of pulmonic stenosis. Aorta: The aortic root and ascending aorta are structurally normal, with no evidence of dilitation. Venous: The inferior vena cava was not well visualized. The inferior vena cava is normal in size with greater than 50% respiratory variability, suggesting right atrial pressure of 3 mmHg. IAS/Shunts: No atrial level shunt detected by color flow Doppler.  LEFT VENTRICLE PLAX 2D LVIDd:         4.40 cm   Diastology LVIDs:         2.40 cm   LV e' medial:    5.77 cm/s LV PW:         1.30 cm   LV E/e' medial:  5.5 LV IVS:        1.10 cm   LV e' lateral:   8.38 cm/s LVOT diam:     2.20 cm   LV E/e' lateral: 3.8 LV SV:         52 LV SV Index:   27 LVOT Area:     3.80 cm  RIGHT VENTRICLE RV S prime:     14.90 cm/s TAPSE (M-mode): 2.1 cm LEFT ATRIUM           Index LA diam:      3.80 cm 1.96 cm/m LA Vol (A2C): 83.7 ml 43.26 ml/m  AORTIC VALVE                    PULMONIC VALVE AV Area (Vmax):    3.03 cm     PV Vmax:       0.85 m/s AV Area (Vmean):   2.98 cm     PV Peak grad:  2.9 mmHg AV Area (VTI):     2.78 cm AV Vmax:           106.00 cm/s AV Vmean:          71.100 cm/s AV VTI:            0.186 m AV Peak Grad:      4.5 mmHg AV Mean Grad:      2.0 mmHg LVOT Vmax:         84.40 cm/s LVOT Vmean:        55.700 cm/s LVOT VTI:          0.136 m LVOT/AV VTI ratio: 0.73 AR Vena Contracta: 0.20 cm  AORTA Ao Root diam: 3.60 cm Ao Asc diam:  3.70 cm Ao Arch diam: 3.3 cm MITRAL VALVE               TRICUSPID VALVE MV Area (PHT): 3.72 cm    TR Peak grad:   31.8 mmHg MV Decel Time: 204 msec    TR Vmax:        282.00 cm/s MV E velocity: 31.45 cm/s MV A velocity: 69.05 cm/s  SHUNTS MV E/A ratio:  0.46        Systemic VTI:  0.14 m  Systemic Diam: 2.20 cm Thurmon Fair MD Electronically signed by Thurmon Fair MD  Signature Date/Time: 10/11/2022/5:09:24 PM    Final       Isadore Bokhari T. Laken Rog [facility]  If 7PM-7AM, please contact night-coverage [email or web address] 10/12/2022, 10:33 AM

## 2022-10-13 DIAGNOSIS — Z9189 Other specified personal risk factors, not elsewhere classified: Secondary | ICD-10-CM | POA: Diagnosis not present

## 2022-10-13 DIAGNOSIS — I4891 Unspecified atrial fibrillation: Secondary | ICD-10-CM | POA: Diagnosis not present

## 2022-10-13 DIAGNOSIS — N3000 Acute cystitis without hematuria: Secondary | ICD-10-CM | POA: Diagnosis not present

## 2022-10-13 DIAGNOSIS — G309 Alzheimer's disease, unspecified: Secondary | ICD-10-CM | POA: Diagnosis not present

## 2022-10-13 LAB — RENAL FUNCTION PANEL
Albumin: 3.2 g/dL — ABNORMAL LOW (ref 3.5–5.0)
Anion gap: 11 (ref 5–15)
BUN: 8 mg/dL (ref 8–23)
CO2: 23 mmol/L (ref 22–32)
Calcium: 8.7 mg/dL — ABNORMAL LOW (ref 8.9–10.3)
Chloride: 104 mmol/L (ref 98–111)
Creatinine, Ser: 0.89 mg/dL (ref 0.61–1.24)
GFR, Estimated: >60 mL/min (ref >60)
Glucose, Bld: 91 mg/dL (ref 70–99)
Phosphorus: 2.9 mg/dL (ref 2.5–4.6)
Potassium: 4.5 mmol/L (ref 3.5–5.1)
Sodium: 138 mmol/L (ref 135–145)

## 2022-10-13 LAB — CBC
HCT: 39.7 % (ref 39.0–52.0)
Hemoglobin: 13.2 g/dL (ref 13.0–17.0)
MCH: 32.6 pg (ref 26.0–34.0)
MCHC: 33.2 g/dL (ref 30.0–36.0)
MCV: 98 fL (ref 80.0–100.0)
Platelets: 155 10*3/uL (ref 150–400)
RBC: 4.05 MIL/uL — ABNORMAL LOW (ref 4.22–5.81)
RDW: 14.2 % (ref 11.5–15.5)
WBC: 12.7 10*3/uL — ABNORMAL HIGH (ref 4.0–10.5)
nRBC: 0 % (ref 0.0–0.2)

## 2022-10-13 LAB — CULTURE, BLOOD (ROUTINE X 2): Culture: NO GROWTH

## 2022-10-13 LAB — MAGNESIUM: Magnesium: 1.7 mg/dL (ref 1.7–2.4)

## 2022-10-13 MED ORDER — SULFAMETHOXAZOLE-TRIMETHOPRIM 800-160 MG PO TABS: 1.0000 | ORAL_TABLET | Freq: Two times a day (BID) | ORAL | 0 refills | Status: AC

## 2022-10-13 MED ORDER — TAMSULOSIN HCL 0.4 MG PO CAPS: 0.4000 mg | ORAL_CAPSULE | Freq: Every day | ORAL | 2 refills | Status: DC

## 2022-10-13 MED ORDER — METOPROLOL TARTRATE 25 MG PO TABS: 12.5000 mg | ORAL_TABLET | Freq: Two times a day (BID) | ORAL | 3 refills | Status: AC

## 2022-10-13 MED ORDER — SULFAMETHOXAZOLE-TRIMETHOPRIM 800-160 MG PO TABS
1.0000 | ORAL_TABLET | Freq: Two times a day (BID) | ORAL | Status: DC
  Administered 2022-10-13: 1 via ORAL
  Filled 2022-10-13: qty 1

## 2022-10-13 NOTE — Plan of Care (Signed)
Discharge orders placed and printed. Awaiting rolling walker delivery to bedside for discharge.   Problem: Education: Goal: Knowledge of General Education information will improve Description: Including pain rating scale, medication(s)/side effects and non-pharmacologic comfort measures Outcome: Completed/Met   Problem: Health Behavior/Discharge Planning: Goal: Ability to manage health-related needs will improve Outcome: Completed/Met   Problem: Clinical Measurements: Goal: Ability to maintain clinical measurements within normal limits will improve Outcome: Completed/Met Goal: Will remain free from infection Outcome: Completed/Met Goal: Diagnostic test results will improve Outcome: Completed/Met Goal: Respiratory complications will improve Outcome: Completed/Met Goal: Cardiovascular complication will be avoided Outcome: Completed/Met   Problem: Activity: Goal: Risk for activity intolerance will decrease Outcome: Completed/Met   Problem: Nutrition: Goal: Adequate nutrition will be maintained Outcome: Completed/Met   Problem: Coping: Goal: Level of anxiety will decrease Outcome: Completed/Met   Problem: Elimination: Goal: Will not experience complications related to bowel motility Outcome: Completed/Met Goal: Will not experience complications related to urinary retention Outcome: Completed/Met   Problem: Pain Managment: Goal: General experience of comfort will improve Outcome: Completed/Met   Problem: Safety: Goal: Ability to remain free from injury will improve Outcome: Completed/Met   Problem: Skin Integrity: Goal: Risk for impaired skin integrity will decrease Outcome: Completed/Met

## 2022-10-13 NOTE — Discharge Summary (Signed)
Physician Discharge Summary   Perry Nielsen. ZOX:[id number] DOB: 07/09/1940 DOA: 10/09/2022  PCP: Philip Aspen, Limmie Patricia, MD  Admit date: 10/09/2022 Discharge date: 10/13/2022   Admitted From: Home Disposition:  Home Discharging physician: Lewie Chamber, MD Barriers to discharge: none  Recommendations for Outpatient Follow-up:  Follow up with urology for voiding trial   Discharge Condition: stable CODE STATUS: Full Diet recommendation:  Diet Orders (From admission, onward)     Start     Ordered   10/13/22 0000  Diet general        10/13/22 1115   10/09/22 1641  Diet regular Room service appropriate? Yes; Fluid consistency: Thin  Diet effective now       Question Answer Comment  Room service appropriate? Yes   Fluid consistency: Thin      10/09/22 1640            Hospital Course: E. coli UTI: Presents with urinary retention, leukocytosis, abnormal UA with large LE, positive nitrite, small Hgb and rare bacteria.  Foley catheter inserted.  Urine culture with E. coli. -Treated with Rocephin during hospitalization de-escalated to Bactrim to complete course at discharge -Continue Foley catheter until outpatient follow-up with urology   New onset atrial fibrillation with RVR: Confirmed on EKG.  Prior EKG in 2019 sinus rhythm.  Has not had another EKG since then. CHA2DS2-VASc score 2 for age.  Converted to sinus rhythm after low-dose metoprolol.  TSH normal.  TTE without significant finding. -Continue low-dose Toprol -Defer anticoagulation given his age, dementia and risk for fall.  Takes baby aspirin at home   Chronic pain on opiates -Continue home Norco -Bowel regimen to counteract constipation   Also has dementia without behavioral disturbance: Awake and alert and oriented x 2.  Very limited insight -Reorientation and delirium precaution -Continue home Aricept   Leukocytosis/bandemia: Likely due to infection.  Improving.   Acute urinary retention: Failed  voiding trial. -Continue Foley catheter on discharge.  Outpatient follow-up with urology for voiding trial. -Continue Flomax.   GERD -Continue PPI   Hypothyroidism -Continue Synthroid   At risk for polypharmacy: On Norco, Elavil, Ativan   Lethargy/generalized weakness: Multifactorial. -Treat treatable causes -PT/OT; wife wished to take patient home at discharge   The patient's chronic medical conditions were treated accordingly per the patient's home medication regimen except as noted.  On day of discharge, patient was felt deemed stable for discharge. Patient/family member advised to call PCP or come back to ER if needed.   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     Discharge Instructions     Ambulatory referral to Physical Therapy   Complete by: As directed    Diet general   Complete by: As directed    Increase activity slowly   Complete by: As directed       Allergies as of 10/13/2022       Reactions   Antihistamines, Diphenhydramine-type Swelling, Other (See Comments)   CAUSED PROSTATE TO SWELL &  as a child had hyperactivity when took Benadryl Pt took Pepcid w/ Benadryl PO and did fine per Pt & his wife        Medication List     TAKE these medications    amitriptyline 25 MG tablet Commonly known as: ELAVIL TAKE 1 TABLET(25 MG) BY MOUTH AT BEDTIME What changed: See the new instructions.   aspirin EC 81 MG tablet Take 81 mg  by mouth in the morning. Swallow whole.   donepezil 10 MG tablet Commonly known as: ARICEPT Take one tablet at 10 mg daily What changed:  how much to take how to take this when to take this   HYDROcodone-acetaminophen 5-325 MG tablet Commonly known as: Norco Take 1 tablet by mouth 2 (two) times daily as needed for moderate pain. What changed: Another medication with the same name was removed. Continue taking this medication, and follow the directions you see here.   ibuprofen 200 MG tablet Commonly known as: ADVIL Take 1,000-1,200 mg by mouth daily as needed (for pain).   levothyroxine 25 MCG tablet Commonly known as: SYNTHROID TAKE 1 TABLET(25 MCG) BY MOUTH DAILY BEFORE BREAKFAST What changed: See the new instructions.   LORazepam 0.5 MG tablet Commonly known as: ATIVAN TAKE 1 TABLET(0.5 MG) BY MOUTH TWICE DAILY AS NEEDED FOR ANXIETY What changed: See the new instructions.   metoprolol tartrate 25 MG tablet Commonly known as: LOPRESSOR Take 0.5 tablets (12.5 mg total) by mouth 2 (two) times daily. Hold dose if blood pressure is less than 90/60 or if heart rate is less than 60   MIRALAX PO Take 17 g by mouth daily in the afternoon.   NON FORMULARY Apply 1 application  topically See admin instructions. Nervive Pain Relieving Cream- Apply to the right foot every 8 hours as needed for pain   oxymetazoline 0.05 % nasal spray Commonly known as: AFRIN Place 1 spray into both nostrils 2 (two) times daily as needed for congestion.   sulfamethoxazole-trimethoprim 800-160 MG tablet Commonly known as: BACTRIM DS Take 1 tablet by mouth every 12 (twelve) hours for 3 days.   SYSTANE OP Place 1 drop into both eyes daily as needed (for dryness).   tamsulosin 0.4 MG Caps capsule Commonly known as: FLOMAX Take 1 capsule (0.4 mg total) by mouth daily after supper.               Durable Medical Equipment  (From admission, onward)           Start     Ordered    10/13/22 1130  For home use only DME Walker rolling  Once       Question Answer Comment  Walker: With 5 Inch Wheels   Patient needs a walker to treat with the following condition Difficulty walking      10/13/22 1130   10/13/22 1116  For home use only DME Walker rolling  Once       Question Answer Comment  Walker: With 5 Inch Wheels   Patient needs a walker to treat with the following condition Generalized muscle weakness      10/13/22 1115            Follow-up Information     [facility]. Schedule an appointment as soon as possible for a visit.   Why: They will contact you to schedule outpatient physical therapy. Contact information: [address] [place], [place] [phone]  Allergies  Allergen Reactions   Antihistamines, Diphenhydramine-Type Swelling and Other (See Comments)    CAUSED PROSTATE TO SWELL & as a child had hyperactivity when took Benadryl Pt took Pepcid w/ Benadryl PO and did fine per Pt & his wife    Consultations:   Procedures:   Discharge Exam: BP 118/89 (BP Location: Right Arm)   Pulse 78   Temp 98.5 F (36.9 C) (Oral)   Resp 18   Ht 6' (1.829 m)   Wt 72.3 kg   SpO2 95%   BMI 21.62 kg/m  Physical Exam Constitutional:      General: He is not in acute distress.    Appearance: Normal appearance.     Comments: Confusion appreciated  HENT:     Head: Normocephalic and atraumatic.     Mouth/Throat:     Mouth: Mucous membranes are moist.  Eyes:     Extraocular Movements: Extraocular movements intact.  Cardiovascular:     Rate and Rhythm: Normal rate and regular rhythm.     Heart sounds: Normal heart sounds.  Pulmonary:     Effort: Pulmonary effort is normal. No respiratory distress.     Breath sounds: Normal breath sounds. No wheezing.  Abdominal:     General: Bowel sounds are normal. There is no distension.     Palpations: Abdomen is soft.     Tenderness: There is no abdominal  tenderness.  Genitourinary:    Comments: Foley in place with yellow urine noted in bag Musculoskeletal:        General: Normal range of motion.     Cervical back: Normal range of motion and neck supple.  Skin:    General: Skin is warm and dry.  Neurological:     Mental Status: He is alert. He is disoriented.     Comments: Dementia appreciated  Psychiatric:        Mood and Affect: Mood normal.        Behavior: Behavior normal.      The results of significant diagnostics from this hospitalization (including imaging, microbiology, ancillary and laboratory) are listed below for reference.   Microbiology: Recent Results (from the past 240 hour(s))  Urine Culture     Status: Abnormal   Collection Time: 10/09/22  1:12 PM   Specimen: Urine, Catheterized  Result Value Ref Range Status   Specimen Description   Final    URINE, CATHETERIZED Performed at [facility], [address], [place], [place] [address]    Special Requests   Final    NONE Performed at [facility], [address], [place], [place] [address]    Culture >=100,000 COLONIES/mL ESCHERICHIA COLI (A)  Final   Report Status 10/12/2022 FINAL  Final   Organism ID, Bacteria ESCHERICHIA COLI (A)  Final      Susceptibility   Escherichia coli - MIC*    AMPICILLIN >=32 RESISTANT Resistant     CEFAZOLIN >=64 RESISTANT Resistant     CEFEPIME <=0.12 SENSITIVE Sensitive     CIPROFLOXACIN <=0.25 SENSITIVE Sensitive     GENTAMICIN <=1 SENSITIVE Sensitive     IMIPENEM <=0.25 SENSITIVE Sensitive     NITROFURANTOIN <=16 SENSITIVE Sensitive     TRIMETH/SULFA <=20 SENSITIVE Sensitive     AMPICILLIN/SULBACTAM >=32 RESISTANT Resistant     PIP/TAZO 8 SENSITIVE Sensitive     * >=100,000 COLONIES/mL ESCHERICHIA COLI  Culture, blood (routine x 2)     Status: None (Preliminary result)   Collection Time: 10/09/22  1:50 PM   Specimen: BLOOD RIGHT FOREARM  Result Value Ref Range Status   Specimen  Description   Final    BLOOD RIGHT FOREARM Performed at [facility], [address] [place], [place] [address]    Special Requests   Final    BOTTLES DRAWN AEROBIC AND ANAEROBIC Blood Culture adequate volume Performed at [facility], [address], [place], [place] [address]    Culture   Final    NO GROWTH 4 DAYS Performed at [facility], [address], [place], [place] [address]    Report Status PENDING  Incomplete  Culture, blood (routine x 2)     Status: None (Preliminary result)   Collection Time: 10/09/22  3:50 PM   Specimen: BLOOD  Result Value Ref Range Status   Specimen Description   Final    BLOOD RIGHT ANTECUBITAL Performed at [facility], [address] [place], [place] [address]    Special Requests   Final    BOTTLES DRAWN AEROBIC AND ANAEROBIC Blood Culture adequate volume Performed at [facility], [address], [place], [place] [address]    Culture   Final    NO GROWTH 4 DAYS Performed at [facility], [address] [place], [place] [address]    Report Status PENDING  Incomplete     Labs: BNP (last 3 results) No results for input(s): "BNP" in the last 8760 hours. Basic Metabolic Panel: Recent Labs  Lab 10/09/22 1302 10/10/22 0647 10/11/22 1312 10/12/22 0442 10/13/22 0522  NA 140 138 137 137 138  K 3.7 3.5 3.7 3.2* 4.5  CL 106 106 100 106 104  CO2 24 24 27 22 23   GLUCOSE 111* 111* 113* 100* 91  BUN 11 13 14 10 8   CREATININE 1.01 0.96 1.10 0.78 0.89  CALCIUM 8.6* 8.3* 8.6* 8.1* 8.7*  MG  --   --  1.9 1.8 1.7  PHOS  --   --  1.9* 3.1 2.9   Liver Function Tests: Recent Labs  Lab 10/11/22 1312 10/12/22 0442 10/13/22 0522  ALBUMIN 3.5 3.1* 3.2*   No results for input(s): "LIPASE", "AMYLASE" in the last 168 hours. No results for input(s): "AMMONIA" in the last 168 hours. CBC: Recent Labs  Lab 10/09/22 1302 10/10/22 0647 10/11/22 0533 10/12/22 0442  10/13/22 0522  WBC 28.8* 33.0* 25.2* 15.9* 12.7*  NEUTROABS 22.4*  --  21.4* 11.0*  --   HGB 14.8 14.0 13.8 12.3* 13.2  HCT 43.6 42.2 42.5 37.3* 39.7  MCV 96.0 98.4 99.3 97.9 98.0  PLT 162 148* 143* 135* 155   Cardiac Enzymes: No results for input(s): "CKTOTAL", "CKMB", "CKMBINDEX", "TROPONINI" in the last 168 hours. BNP: Invalid input(s): "POCBNP" CBG: No results for input(s): "GLUCAP" in the last 168 hours. D-Dimer No results for input(s): "DDIMER" in the last 72 hours. Hgb A1c No results for input(s): "HGBA1C" in the last 72 hours. Lipid Profile No results for input(s): "CHOL", "HDL", "LDLCALC", "TRIG", "CHOLHDL", "LDLDIRECT" in the last 72 hours. Thyroid function studies Recent Labs    10/11/22 1312  TSH 4.094   Anemia work up No results for input(s): "VITAMINB12", "FOLATE", "FERRITIN", "TIBC", "IRON", "RETICCTPCT" in the last 72 hours. Urinalysis    Component Value Date/Time   COLORURINE YELLOW 10/09/2022 1312   APPEARANCEUR HAZY (A) 10/09/2022 1312   LABSPEC 1.011 10/09/2022 1312   PHURINE 5.0 10/09/2022 1312   GLUCOSEU NEGATIVE 10/09/2022 1312   HGBUR  SMALL (A) 10/09/2022 1312   BILIRUBINUR NEGATIVE 10/09/2022 1312   BILIRUBINUR Negative 03/30/2018 1202   KETONESUR NEGATIVE 10/09/2022 1312   PROTEINUR NEGATIVE 10/09/2022 1312   UROBILINOGEN 0.2 03/30/2018 1202   UROBILINOGEN 0.2 04/21/2015 1439   NITRITE POSITIVE (A) 10/09/2022 1312   LEUKOCYTESUR LARGE (A) 10/09/2022 1312   Sepsis Labs Recent Labs  Lab 10/10/22 0647 10/11/22 0533 10/12/22 0442 10/13/22 0522  WBC 33.0* 25.2* 15.9* 12.7*   Microbiology Recent Results (from the past 240 hour(s))  Urine Culture     Status: Abnormal   Collection Time: 10/09/22  1:12 PM   Specimen: Urine, Catheterized  Result Value Ref Range Status   Specimen Description   Final    URINE, CATHETERIZED Performed at [facility], [address], [place], [place] [address]    Special Requests   Final     NONE Performed at [facility], [address] [place], [place] [address]    Culture >=100,000 COLONIES/mL ESCHERICHIA COLI (A)  Final   Report Status 10/12/2022 FINAL  Final   Organism ID, Bacteria ESCHERICHIA COLI (A)  Final      Susceptibility   Escherichia coli - MIC*    AMPICILLIN >=32 RESISTANT Resistant     CEFAZOLIN >=64 RESISTANT Resistant     CEFEPIME <=0.12 SENSITIVE Sensitive     CIPROFLOXACIN <=0.25 SENSITIVE Sensitive     GENTAMICIN <=1 SENSITIVE Sensitive     IMIPENEM <=0.25 SENSITIVE Sensitive     NITROFURANTOIN <=16 SENSITIVE Sensitive     TRIMETH/SULFA <=20 SENSITIVE Sensitive     AMPICILLIN/SULBACTAM >=32 RESISTANT Resistant     PIP/TAZO 8 SENSITIVE Sensitive     * >=100,000 COLONIES/mL ESCHERICHIA COLI  Culture, blood (routine x 2)     Status: None (Preliminary result)   Collection Time: 10/09/22  1:50 PM   Specimen: BLOOD RIGHT FOREARM  Result Value Ref Range Status   Specimen Description   Final    BLOOD RIGHT FOREARM Performed at [facility], [address] [place], [place] [address]    Special Requests   Final    BOTTLES DRAWN AEROBIC AND ANAEROBIC Blood Culture adequate volume Performed at [facility], [address] [place], [place] [address]    Culture   Final    NO GROWTH 4 DAYS Performed at [facility], [address] [place], [place] [address]    Report Status PENDING  Incomplete  Culture, blood (routine x 2)     Status: None (Preliminary result)   Collection Time: 10/09/22  3:50 PM   Specimen: BLOOD  Result Value Ref Range Status   Specimen Description   Final    BLOOD RIGHT ANTECUBITAL Performed at [facility], [address], [place], [place] [address]    Special Requests   Final    BOTTLES DRAWN AEROBIC AND ANAEROBIC Blood Culture adequate volume Performed at [facility], [address], [place], [place] [address]    Culture   Final     NO GROWTH 4 DAYS Performed at [facility], [address] [place], [place] [address]    Report Status PENDING  Incomplete    Procedures/Studies: ECHOCARDIOGRAM COMPLETE  Result Date: 10/11/2022    ECHOCARDIOGRAM REPORT   Patient Name:   Perry Nielsen. Date of Exam: 10/11/2022 Medical Rec #:  [id number]            Height:  72.0 in Accession #:    [id number]           Weight:       159.4 lb Date of Birth:  1941/02/04            BSA:          1.935 m Patient Age:    82 years             BP:           103/76 mmHg Patient Gender: M                    HR:           97 bpm. Exam Location:  Inpatient Procedure: 2D Echo, Cardiac Doppler and Color Doppler Indications:    I48.91 A-fib  History:        Patient has prior history of Echocardiogram examinations, most                 recent 03/08/2015. Arrythmias:Atrial Fibrillation; Risk                 Factors:Non-Smoker.  Sonographer:    Dondra Prader RVT RCS Referring Phys: [id number] Boyce Medici GONFA  Sonographer Comments: Technically challenging study due to limited acoustic windows, Technically difficult study due to poor echo windows, suboptimal parasternal window, suboptimal apical window and no subcostal window. IMPRESSIONS  1. Left ventricular ejection fraction, by estimation, is 60 to 65%. The left ventricle has normal function. The left ventricle has no regional wall motion abnormalities. Left ventricular diastolic parameters are consistent with Grade I diastolic dysfunction (impaired relaxation).  2. Right ventricular systolic function is normal. The right ventricular size is mildly enlarged.  3. The mitral valve is normal in structure. No evidence of mitral valve regurgitation. No evidence of mitral stenosis.  4. The aortic valve is normal in structure. There is mild calcification of the aortic valve. Aortic valve regurgitation is trivial. Aortic valve sclerosis/calcification is present, without any evidence of aortic stenosis.  5. The inferior vena cava  is normal in size with greater than 50% respiratory variability, suggesting right atrial pressure of 3 mmHg. Comparison(s): Prior images unable to be directly viewed, comparison made by report only. Conclusion(s)/Recommendation(s): The patient's rhythm converted from atrial fibrillation to normal sinus rhythm during the echo study. FINDINGS  Left Ventricle: Left ventricular ejection fraction, by estimation, is 60 to 65%. The left ventricle has normal function. The left ventricle has no regional wall motion abnormalities. The left ventricular internal cavity size was normal in size. There is  no left ventricular hypertrophy. Left ventricular diastolic parameters are consistent with Grade I diastolic dysfunction (impaired relaxation). Right Ventricle: The right ventricular size is mildly enlarged. No increase in right ventricular wall thickness. Right ventricular systolic function is normal. Left Atrium: Left atrial size was normal in size. Right Atrium: Right atrial size was normal in size. Prominent Eustachian valve. Pericardium: There is no evidence of pericardial effusion. Mitral Valve: The mitral valve is normal in structure. Mild mitral annular calcification. No evidence of mitral valve regurgitation. No evidence of mitral valve stenosis. Tricuspid Valve: The tricuspid valve is normal in structure. Tricuspid valve regurgitation is mild . No evidence of tricuspid stenosis. Aortic Valve: The aortic valve is normal in structure. There is mild calcification of the aortic valve. Aortic valve regurgitation is trivial. Aortic valve sclerosis/calcification is present, without any evidence of aortic stenosis. Aortic valve mean gradient measures 2.0 mmHg. Aortic valve peak gradient  measures 4.5 mmHg. Aortic valve area, by VTI measures 2.78 cm. Pulmonic Valve: The pulmonic valve was normal in structure. Pulmonic valve regurgitation is not visualized. No evidence of pulmonic stenosis. Aorta: The aortic root and ascending  aorta are structurally normal, with no evidence of dilitation. Venous: The inferior vena cava was not well visualized. The inferior vena cava is normal in size with greater than 50% respiratory variability, suggesting right atrial pressure of 3 mmHg. IAS/Shunts: No atrial level shunt detected by color flow Doppler.  LEFT VENTRICLE PLAX 2D LVIDd:         4.40 cm   Diastology LVIDs:         2.40 cm   LV e' medial:    5.77 cm/s LV PW:         1.30 cm   LV E/e' medial:  5.5 LV IVS:        1.10 cm   LV e' lateral:   8.38 cm/s LVOT diam:     2.20 cm   LV E/e' lateral: 3.8 LV SV:         52 LV SV Index:   27 LVOT Area:     3.80 cm  RIGHT VENTRICLE RV S prime:     14.90 cm/s TAPSE (M-mode): 2.1 cm LEFT ATRIUM           Index LA diam:      3.80 cm 1.96 cm/m LA Vol (A2C): 83.7 ml 43.26 ml/m  AORTIC VALVE                    PULMONIC VALVE AV Area (Vmax):    3.03 cm     PV Vmax:       0.85 m/s AV Area (Vmean):   2.98 cm     PV Peak grad:  2.9 mmHg AV Area (VTI):     2.78 cm AV Vmax:           106.00 cm/s AV Vmean:          71.100 cm/s AV VTI:            0.186 m AV Peak Grad:      4.5 mmHg AV Mean Grad:      2.0 mmHg LVOT Vmax:         84.40 cm/s LVOT Vmean:        55.700 cm/s LVOT VTI:          0.136 m LVOT/AV VTI ratio: 0.73 AR Vena Contracta: 0.20 cm  AORTA Ao Root diam: 3.60 cm Ao Asc diam:  3.70 cm Ao Arch diam: 3.3 cm MITRAL VALVE               TRICUSPID VALVE MV Area (PHT): 3.72 cm    TR Peak grad:   31.8 mmHg MV Decel Time: 204 msec    TR Vmax:        282.00 cm/s MV E velocity: 31.45 cm/s MV A velocity: 69.05 cm/s  SHUNTS MV E/A ratio:  0.46        Systemic VTI:  0.14 m                            Systemic Diam: 2.20 cm Rachelle Hora Croitoru MD Electronically signed by Thurmon Fair MD Signature Date/Time: 10/11/2022/5:09:24 PM    Final    CT Head Wo Contrast  Result Date: 10/09/2022 CLINICAL DATA:  TIA EXAM: CT HEAD WITHOUT CONTRAST TECHNIQUE: Contiguous  axial images were obtained from the base of the skull through the  vertex without intravenous contrast. RADIATION DOSE REDUCTION: This exam was performed according to the departmental dose-optimization program which includes automated exposure control, adjustment of the mA and/or kV according to patient size and/or use of iterative reconstruction technique. COMPARISON:  03/24/2015 CT head, correlation is also made with 01/16/2022 MRI head FINDINGS: Brain: No evidence of acute infarction, hemorrhage, mass, mass effect, or midline shift. No hydrocephalus or extra-axial fluid collection. Periventricular white matter changes, likely the sequela of chronic small vessel ischemic disease. Age related cerebral atrophy. Vascular: No hyperdense vessel. Skull: Negative for fracture or focal lesion. Sinuses/Orbits: No acute finding. Status post bilateral lens replacements. Other: The mastoid air cells are well aerated. IMPRESSION: No acute intracranial process. Electronically Signed   By: Wiliam Ke M.D.   On: 10/09/2022 22:07     Time coordinating discharge: Over 30 minutes    Lewie Chamber, MD  [facility] 10/13/2022, 3:01 PM

## 2022-10-13 NOTE — TOC Initial Note (Addendum)
Transition of Care (TOC) - Initial/Assessment Note    Patient Details  Name: Perry Nielsen. MRN: [id number] Date of Birth: 04-Mar-1941  Transition of Care [facility]) CM/SW Contact:    Erin Sons, LCSW Phone Number: 10/13/2022, 9:34 AM  Clinical Narrative:                  PT recommending OPPT and rolling walker. Pt Ox2. CSW called spouse and explained recs. Pt lives at home with spouse in [place]. Spouse states pt already has a rolling walker at home. She is agreeable to OPPT referral. Spouse states she will provide transportation on day of DC.   1120: CSW is informed that pt's walker at home does not have wheels. CSW confirmed with spouse and ordered rolling walker with [employer] to be delivered to room. Spouse also states they already have a 3-in-1.    Expected Discharge Plan: OP Rehab Barriers to Discharge: Continued Medical Work up   Patient Goals and CMS Choice            Expected Discharge Plan and Services       Living arrangements for the past 2 months: Apartment                                      Prior Living Arrangements/Services Living arrangements for the past 2 months: Apartment Lives with:: Spouse                   Activities of Daily Living Home Assistive Devices/Equipment: Cane (specify quad or straight) (straight) ADL Screening (condition at time of admission) Patient's cognitive ability adequate to safely complete daily activities?: Yes Is the patient deaf or have difficulty hearing?: No Does the patient have difficulty seeing, even when wearing glasses/contacts?: No Does the patient have difficulty concentrating, remembering, or making decisions?: Yes Patient able to express need for assistance with ADLs?: Yes Does the patient have difficulty dressing or bathing?: Yes Independently performs ADLs?: No Communication: Independent Dressing (OT): Needs assistance (pt states "about a week ago, I started being unable to dress myself  properly") Is this a change from baseline?: Pre-admission baseline Grooming: Independent Feeding: Independent Bathing: Needs assistance Is this a change from baseline?: Pre-admission baseline Toileting: Independent In/Out Bed: Independent Walks in Home: Independent (uses a cane at home) Does the patient have difficulty walking or climbing stairs?: Yes Weakness of Legs: Right Weakness of Arms/Hands: None  Permission Sought/Granted                  Emotional Assessment       Orientation: : Oriented to Self, Oriented to Place Alcohol / Substance Use: Not Applicable Psych Involvement: No (comment)  Admission diagnosis:  Urinary retention [R33.9] UTI (urinary tract infection) [N39.0] Acute UTI [N39.0] Patient Active Problem List   Diagnosis Date Noted   New onset atrial fibrillation 10/11/2022   Chronic pain 10/10/2022   Generalized weakness 10/10/2022   Bandemia 10/10/2022   At risk for polypharmacy 10/10/2022   UTI (urinary tract infection) 10/09/2022   Mild vascular dementia without behavioral disturbance, psychotic disturbance, mood disturbance, or anxiety 02/02/2022   Dementia due to Alzheimer's disease 01/02/2022   Vitamin B12 deficiency 09/03/2020   Hypothyroidism 09/03/2020   Routine general medical examination at a health care facility 03/23/2017   Need for prophylactic vaccination and inoculation against influenza 03/23/2017   Lymphoma, 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  Foot drop 02/03/2015   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   ED (erectile dysfunction) 08/07/2008   KNEE PAIN, LEFT, CHRONIC 08/07/2008   CAROTID BRUIT, LEFT 08/07/2008   Allergic rhinitis 08/03/2007   DYSFNCT ASSO W/SLEEP STGES/AROUSAL FRM SLEEP 08/03/2007   GERD 02/18/2007   BPH associated with nocturia 02/18/2007   PCP:  Philip Aspen, Limmie Patricia, MD Pharmacy:   [facility] [phone] [place], [place] - [address] AT [facility] OF [address] & [facility] [address] [place] [address] Phone: [phone] Fax: [phone]     Social Determinants of Health (SDOH) Social History: SDOH Screenings   Food Insecurity: No Food Insecurity (10/09/2022)  Housing: Low Risk  (10/09/2022)  Transportation Needs: No Transportation Needs (10/09/2022)  Utilities: Not At Risk (10/09/2022)  Alcohol Screen: Low Risk  (10/29/2021)  Depression (PHQ2-9): Low Risk  (06/11/2022)  Financial Resource Strain: Low Risk  (03/11/2022)  Physical Activity: Sufficiently Active (10/29/2021)  Social Connections: Socially Integrated (10/29/2021)  Stress: No Stress Concern Present (10/29/2021)  Tobacco Use: Low Risk  (10/09/2022)   SDOH Interventions:     Readmission Risk Interventions     No data to display

## 2022-10-13 NOTE — Progress Notes (Signed)
Mobility Specialist - Progress Note   10/13/22 0941  Mobility  Activity Ambulated with assistance to bathroom  Level of Assistance Modified independent, requires aide device or extra time  Assistive Device Front wheel walker  Distance Ambulated (ft) 20 ft  Activity Response Tolerated well  Mobility Referral Yes  $Mobility charge 1 Mobility   Pt received in bed and refused mobility but agreed to assistance to transfer, pt went to restroom over claims of potential BM. Pt returned to chair with all needs met, alarm on and staff in room.   Marilynne Halsted Mobility Specialist

## 2022-10-13 NOTE — Hospital Course (Signed)
E. coli UTI: Presents with urinary retention, leukocytosis, abnormal UA with large LE, positive nitrite, small Hgb and rare bacteria.  Foley catheter inserted.  Urine culture with E. coli. -Treated with Rocephin during hospitalization de-escalated to Bactrim to complete course at discharge -Continue Foley catheter until outpatient follow-up with urology   New onset atrial fibrillation with RVR: Confirmed on EKG.  Prior EKG in 2019 sinus rhythm.  Has not had another EKG since then. CHA2DS2-VASc score 2 for age.  Converted to sinus rhythm after low-dose metoprolol.  TSH normal.  TTE without significant finding. -Continue low-dose Toprol -Defer anticoagulation given his age, dementia and risk for fall.  Takes baby aspirin at home   Chronic pain on opiates -Continue home Norco -Bowel regimen to counteract constipation   Also has dementia without behavioral disturbance: Awake and alert and oriented x 2.  Very limited insight -Reorientation and delirium precaution -Continue home Aricept   Leukocytosis/bandemia: Likely due to infection.  Improving.   Acute urinary retention: Failed voiding trial. -Continue Foley catheter on discharge.  Outpatient follow-up with urology for voiding trial. -Continue Flomax.   GERD -Continue PPI   Hypothyroidism -Continue Synthroid   At risk for polypharmacy: On Norco, Elavil, Ativan   Lethargy/generalized weakness: Multifactorial. -Treat treatable causes -PT/OT; wife wished to take patient home at discharge

## 2022-10-13 NOTE — Care Management Important Message (Signed)
Important Message  Patient Details IM Letter placed in Patient's room. Name: Perry Nielsen. MRN: [id number] Date of Birth: [birth year]   Medicare Important Message Given:  Yes     Caren Macadam 10/13/2022, 9:02 AM

## 2022-10-13 NOTE — Progress Notes (Signed)
Occupational Therapy Treatment Patient Details Name: Perry Nielsen. MRN: [id number] DOB: 1941/04/22 Today's Date: 10/13/2022   History of present illness Pt is an 82yo male presenting to [facility] ED on 4/5 with UTI.   PMH: dementia, BPH, large cell lymphoma, HTN, hypothyroidism, back pain, GERD, L knee pain, R-RCR, R foot drop   OT comments  Pt progressing towards OT goals this session. Pt distracted by pain from catheter pulling on penis. Attempted difference positions with Pt performing transfers with RW at min guard level. Pt able to complete self-feeding, toilet transfers, peri care at min guard level. Pt has a stool that he sits on in downstairs half bath where he performs grooming and bathing at home. At this time OT recommendations are appropriate - continued acute care and Home health OT to see Pt function in natural environment and for home safety set up. Wife Perry Nielsen in agreement.    Recommendations for follow up therapy are one component of a multi-disciplinary discharge planning process, led by the attending physician.  Recommendations may be updated based on patient status, additional functional criteria and insurance authorization.    Assistance Recommended at Discharge Frequent or constant Supervision/Assistance  Patient can return home with the following  A little help with walking and/or transfers;A little help with bathing/dressing/bathroom;Assistance with cooking/housework   Equipment Recommendations  BSC/3in1    Recommendations for Other Services      Precautions / Restrictions Precautions Precautions: Fall Precaution Comments: hx of falls Restrictions Weight Bearing Restrictions: No       Mobility Bed Mobility Overal bed mobility: Needs Assistance Bed Mobility: Supine to Sit, Sit to Supine     Supine to sit: Supervision Sit to supine: Supervision   General bed mobility comments: increased time    Transfers Overall transfer level: Needs  assistance Equipment used: Rolling walker (2 wheels) Transfers: Sit to/from Stand, Bed to chair/wheelchair/BSC Sit to Stand: Min guard Stand pivot transfers: Min guard   Step pivot transfers: Min guard     General transfer comment: good power upvc for safe hand placement     Balance Overall balance assessment: Needs assistance, History of Falls Sitting-balance support: Feet supported, No upper extremity supported Sitting balance-Leahy Scale: Good     Standing balance support: Reliant on assistive device for balance, During functional activity, Bilateral upper extremity supported Standing balance-Leahy Scale: Poor                             ADL either performed or assessed with clinical judgement   ADL Overall ADL's : Needs assistance/impaired Eating/Feeding: Modified independent;Sitting   Grooming: Min guard;Wash/dry hands;Standing                   Statistician: Min guard;Ambulation;Rolling walker (2 wheels) Toilet Transfer Details (indicate cue type and reason): into bathroom Toileting- Clothing Manipulation and Hygiene: Min guard;Sit to/from stand       Functional mobility during ADLs: Min guard;Rolling walker (2 wheels) General ADL Comments: distracted by catheter pulling on penis and causing pain    Extremity/Trunk Assessment              Vision       Perception     Praxis      Cognition Arousal/Alertness: Awake/alert Behavior During Therapy: WFL for tasks assessed/performed Overall Cognitive Status: Within Functional Limits for tasks assessed  Exercises      Shoulder Instructions       General Comments      Pertinent Vitals/ Pain       Pain Assessment Pain Assessment: Faces Faces Pain Scale: Hurts whole lot Pain Location: penis/bladder and B feet R > L Pain Descriptors / Indicators: Discomfort Pain Intervention(s): Monitored during session, Other (comment)  (getting RN to reposition catheter anchor)  Home Living                                          Prior Functioning/Environment              Frequency  Min 2X/week        Progress Toward Goals  OT Goals(current goals can now be found in the care plan section)  Progress towards OT goals: Progressing toward goals  Acute Rehab OT Goals Patient Stated Goal: get home and be safe OT Goal Formulation: With patient Time For Goal Achievement: 10/24/22 Potential to Achieve Goals: Good  Plan Discharge plan remains appropriate    Co-evaluation                 AM-PAC OT "6 Clicks" Daily Activity     Outcome Measure   Help from another person eating meals?: None Help from another person taking care of personal grooming?: A Little Help from another person toileting, which includes using toliet, bedpan, or urinal?: A Little Help from another person bathing (including washing, rinsing, drying)?: A Lot Help from another person to put on and taking off regular upper body clothing?: A Little Help from another person to put on and taking off regular lower body clothing?: A Lot 6 Click Score: 17    End of Session Equipment Utilized During Treatment: Rolling walker (2 wheels)  OT Visit Diagnosis: Unsteadiness on feet (R26.81);History of falling (Z91.81);Other abnormalities of gait and mobility (R26.89);Muscle weakness (generalized) (M62.81)   Activity Tolerance Patient tolerated treatment well   Patient Left with call bell/phone within reach;with chair alarm set;in bed;with family/visitor present   Nurse Communication Mobility status (catheter anchor needs to be repositioned)        Time: 1157-2620 OT Time Calculation (min): 27 min  Charges: OT General Charges $OT Visit: 1 Visit OT Treatments $Self Care/Home Management : 8-22 mins  Nyoka Cowden OTR/L Acute Rehabilitation Services Office: [phone]  [facility] 10/13/2022, 1:55 PM

## 2022-10-14 ENCOUNTER — Telehealth: Payer: Self-pay

## 2022-10-14 LAB — CULTURE, BLOOD (ROUTINE X 2)

## 2022-10-14 NOTE — Transitions of Care (Post Inpatient/ED Visit) (Signed)
10/14/2022  Name: Perry Nielsen. MRN: [id number] DOB: Jun 21, 1941  Today's TOC FU Call Status: Today's TOC FU Call Status:: Successful TOC FU Call Competed TOC FU Call Complete Date: 10/14/22 (Incoming call from spouse returning RN CM call.)  Transition Care Management Follow-up Telephone Call Date of Discharge: 10/13/22 Discharge Facility: [facility]) Type of Discharge: Inpatient Admission Primary Inpatient Discharge Diagnosis:: "urinary retention" How have you been since you were released from the hospital?: Better Any questions or concerns?: Yes Patient Questions/Concerns:: Spouse states pt has dementia. She woke up this morning to find that patient had pulled out catheter during the night. catheter intact. Wife assessed genital area while on phone and reported no bleeding,swelling or other issues. Since then pt has voided several times and voided during this call. He complains of no pain/discomfort. Urine has been clear yellow. Patient Questions/Concerns Addressed: Notified Provider of Patient Questions/Concerns (RN CM contacted [facility]-spoke with Jennifer,RN to advise that catheter has come out. Advised that as long as patient is voiding without issuse then MD will assess at appt tomorrow. Spouse made aware of this and voices understanding.)  Items Reviewed: Did you receive and understand the discharge instructions provided?: Yes Medications obtained and verified?: Yes (Medications Reviewed) Any new allergies since your discharge?: No Dietary orders reviewed?: Yes Type of Diet Ordered:: low salt/heart healthy Do you have support at home?: Yes People in Home: spouse Name of Support/Comfort Primary Source: [name]-wife  Home Care and Equipment/Supplies: Were Home Health Services Ordered?: NA (Outpt rehab referral was placed. Spouse aware to expect a call from them. If she has not heard from them in a few days-she is aware to call and follow up. She has contact  info.) Any new equipment or medical supplies ordered?: Yes Name of Medical supply agency?: [employer]-rolling walker Were you able to get the equipment/medical supplies?: Yes Do you have any questions related to the use of the equipment/supplies?: No  Functional Questionnaire: Do you need assistance with bathing/showering or dressing?: Yes Do you need assistance with meal preparation?: Yes Do you need assistance with eating?: No Do you have difficulty maintaining continence: Yes Do you need assistance with getting out of bed/getting out of a chair/moving?: Yes Do you have difficulty managing or taking your medications?: Yes  Follow up appointments reviewed: PCP Follow-up appointment confirmed?: Yes Date of PCP follow-up appointment?: 10/21/22 (pt already had appt scheduled with provider prior to admission-spouse prefers to keep this appt time/date) Follow-up Provider: Dr. Garfield Cornea Follow-up appointment confirmed?: Yes Date of Specialist follow-up appointment?: 10/15/22 Follow-Up Specialty Provider:: Dr. Benancio Deeds Do you need transportation to your follow-up appointment?: No (spouse states she is able to take pt to appt) Do you understand care options if your condition(s) worsen?: Yes-patient verbalized understanding  SDOH Interventions Today    Flowsheet Row Most Recent Value  SDOH Interventions   Food Insecurity Interventions Intervention Not Indicated  Transportation Interventions Intervention Not Indicated      TOC Interventions Today    Flowsheet Row Most Recent Value  TOC Interventions   TOC Interventions Discussed/Reviewed TOC Interventions Discussed, Contacted provider for patient needs      Interventions Today    Flowsheet Row Most Recent Value  General Interventions   General Interventions Discussed/Reviewed General Interventions Discussed, Doctor Visits  Doctor Visits Discussed/Reviewed Doctor Visits Discussed, Specialist, PCP   PCP/Specialist Visits Compliance with follow-up visit  Communication with PCP/Specialists  Education Interventions   Education Provided Provided Education  Provided Verbal Education On Nutrition,  When to see the doctor, Medication  Nutrition Interventions   Nutrition Discussed/Reviewed Nutrition Discussed, Adding fruits and vegetables, Decreasing salt  Pharmacy Interventions   Pharmacy Dicussed/Reviewed Pharmacy Topics Discussed, Medications and their functions  Safety Interventions   Safety Discussed/Reviewed Safety Discussed       Alessandra Grout [facility] Care Management Care Management Community Coordinator Direct Phone: [phone] Toll Free: [phone] Fax: [phone]

## 2022-10-14 NOTE — Transitions of Care (Post Inpatient/ED Visit) (Signed)
   10/14/2022  Name: [name]. MRN: [id number] DOB: 03-Oct-1940  Today's TOC FU Call Status: Today's TOC FU Call Status:: Unsuccessul Call (1st Attempt) Unsuccessful Call (1st Attempt) Date: 10/14/22  Attempted to reach the patient regarding the most recent Inpatient/ED visit.  Follow Up Plan: Additional outreach attempts will be made to reach the patient to complete the Transitions of Care (Post Inpatient/ED visit) call.     Antionette Fairy, RN,BSN,CCM [facility] Care Management Care Management Community Coordinator Direct Phone: [phone] Toll Free: [phone] Fax: [phone]

## 2022-10-15 DIAGNOSIS — R338 Other retention of urine: Secondary | ICD-10-CM | POA: Diagnosis not present

## 2022-10-21 ENCOUNTER — Ambulatory Visit (INDEPENDENT_AMBULATORY_CARE_PROVIDER_SITE_OTHER): Payer: Medicare Other | Admitting: Internal Medicine

## 2022-10-21 ENCOUNTER — Encounter: Payer: Self-pay | Admitting: Internal Medicine

## 2022-10-21 VITALS — BP 102/68 | HR 76 | Temp 98.1°F | Wt 160.5 lb

## 2022-10-21 DIAGNOSIS — I480 Paroxysmal atrial fibrillation: Secondary | ICD-10-CM | POA: Diagnosis not present

## 2022-10-21 DIAGNOSIS — N390 Urinary tract infection, site not specified: Secondary | ICD-10-CM

## 2022-10-21 DIAGNOSIS — R269 Unspecified abnormalities of gait and mobility: Secondary | ICD-10-CM

## 2022-10-21 DIAGNOSIS — Z9181 History of falling: Secondary | ICD-10-CM | POA: Diagnosis not present

## 2022-10-21 DIAGNOSIS — Z09 Encounter for follow-up examination after completed treatment for conditions other than malignant neoplasm: Secondary | ICD-10-CM

## 2022-10-21 DIAGNOSIS — B9620 Unspecified Escherichia coli [E. coli] as the cause of diseases classified elsewhere: Secondary | ICD-10-CM

## 2022-10-21 DIAGNOSIS — G309 Alzheimer's disease, unspecified: Secondary | ICD-10-CM | POA: Diagnosis not present

## 2022-10-21 DIAGNOSIS — F0280 Dementia in other diseases classified elsewhere without behavioral disturbance: Secondary | ICD-10-CM

## 2022-10-21 NOTE — Progress Notes (Signed)
Hospital follow-up visit     CC/Reason for Visit: Hospitalization follow-up  HPI: Perry Poitra. is a 82 y.o. male who is coming in today for the above mentioned reasons, specifically transitional care services face-to-face visit.    Dates hospitalized: 10/09/2022-10/13/2022 Days since discharge from hospital: 8 Patient was discharged from the hospital to: Home Reason for admission to hospital: UTI Date of interactive phone contact with patient and/or caregiver: 10/14/2022  I have reviewed in detail patient's discharge summary plus pertinent specific notes, labs, and images from the hospitalization.  Yes  Patient was provided to the hospital due to lethargy and confusion.  Was found to have E. coli UTI and was treated with antibiotics.  He developed urinary retention and was discharged home with a Foley catheter.  He has already followed up with urology and is passed his voiding trial.  While hospitalized he was found to be in new onset atrial fibrillation with RVR.  They elected not to anticoagulate him due to his age and fall risk.  Medication reconciliation was done today and patient is taking meds as recommended by discharging hospitalist/specialist.  Yes   Past Medical/Surgical History: Past Medical History:  Diagnosis Date   ALLERGIC RHINITIS 08/03/2007   BACK PAIN 09/18/2009   Basal cell carcinoma 08/21/2004   right wing nose (MOHS)   BCC (basal cell carcinoma of skin) 06/07/2001   mid upper back   BCC (basal cell carcinoma) 03/08/2013   right forehead (curet & carter)   BPH associated with nocturia 02/18/2007   Qualifier: Diagnosis of  By: Briscoe Burns CMA, Alvy Beal     CAROTID BRUIT, LEFT 08/07/2008   ERECTILE DYSFUNCTION, MILD 08/07/2008   Foot drop    rt foot   GERD 02/18/2007   HAMMER TOE 08/03/2007   Hepatitis 1977   no residual problems, type A   KNEE PAIN, LEFT, CHRONIC 08/07/2008   Lymphoma    right hip   Rash, skin    torso   SCC (squamous cell carcinoma)  11/23/2007   right nose (caulery, 51fu)   SCC (squamous cell carcinoma) 11/24/2016   left upper arm (curetx3, 68fu)   SCC (squamous cell carcinoma) 04/20/2017   mid upper forehead  (tx after bx)   SCC (squamous cell carcinoma) 05/11/2019   left scapha   SCC (squamous cell carcinoma) 08/17/2019   mid back   Skin cancer    Sleeping difficulty    Squamous cell carcinoma of skin 07/31/2003   right brow (curetx3, 42fu)    Past Surgical History:  Procedure Laterality Date   CATARACT EXTRACTION W/ INTRAOCULAR LENS  IMPLANT, BILATERAL Bilateral    KNEE ARTHROSCOPY     left   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 He has never used smokeless tobacco. He reports current alcohol use. He reports that he does not use drugs.  Allergies: Allergies  Allergen Reactions   Antihistamines, Diphenhydramine-Type Swelling and Other (See Comments)    CAUSED PROSTATE TO SWELL & as a child had hyperactivity when took Benadryl Pt took Pepcid w/ Benadryl PO and did fine per Pt & his wife    Family History:  Family History  Problem Relation Age of Onset   Pulmonary fibrosis Mother    Uterine cancer Mother    Prostate cancer Father    Heart disease Brother    Heart attack Other    Aneurysm Other        aortic   Coronary artery disease Sister      Current Outpatient Medications:    amitriptyline (ELAVIL) 25 MG tablet, TAKE 1 TABLET(25 MG) BY MOUTH AT BEDTIME (Patient taking differently: Take 25 mg by mouth at bedtime.), Disp: 90 tablet, Rfl: 1   aspirin EC 81 MG tablet, Take 81 mg by mouth in the morning. Swallow whole., Disp: , Rfl:    donepezil (ARICEPT) 10 MG tablet, Take one tablet at  10 mg daily (Patient taking differently: Take 10 mg by mouth in the morning. Take one tablet at 10 mg daily), Disp: 30 tablet, Rfl: 11   HYDROcodone-acetaminophen (NORCO) 5-325 MG tablet, Take 1 tablet by mouth 2 (two) times daily as needed for moderate pain., Disp: 60 tablet, Rfl: 0   ibuprofen (ADVIL) 200 MG tablet, Take 1,000-1,200 mg by mouth daily as needed (for pain)., Disp: , Rfl:    levothyroxine (SYNTHROID) 25 MCG tablet, TAKE 1 TABLET(25 MCG) BY MOUTH DAILY BEFORE BREAKFAST (Patient taking differently: Take 25 mcg by mouth daily before breakfast.), Disp: 90 tablet, Rfl: 0   LORazepam (ATIVAN) 0.5 MG tablet, TAKE 1 TABLET(0.5 MG) BY MOUTH TWICE DAILY AS NEEDED FOR ANXIETY (Patient taking differently: Take 0.5 mg by mouth in the morning and at bedtime. TAKE 1 TABLET(0.5 MG) BY MOUTH TWICE DAILY AS NEEDED FOR ANXIETY), Disp: 60 tablet, Rfl: 2   metoprolol tartrate (LOPRESSOR) 25 MG tablet, Take 0.5 tablets (12.5 mg total) by mouth 2 (two) times daily. Hold dose if blood pressure is less than 90/60 or if heart rate is less than 60, Disp: 60 tablet, Rfl: 3   NON FORMULARY, Apply 1 application  topically See admin instructions. Nervive Pain Relieving Cream- Apply to the right foot every 8 hours as needed for pain, Disp: , Rfl:    Polyethyl Glycol-Propyl Glycol (SYSTANE OP), Place 1 drop into both eyes daily as needed (for dryness)., Disp: , Rfl:    Polyethylene Glycol 3350 (MIRALAX PO), Take 17 g by mouth daily in the afternoon., Disp: , Rfl:    silodosin (RAPAFLO) 8 MG CAPS capsule, Take 8 mg by mouth daily., Disp: , Rfl:   Review of Systems:  Negative except as mentioned in HPI.    Physical Exam: Vitals:   10/21/22 1457  BP: 102/68  Pulse: 76  Temp: 98.1 F (36.7 C)  TempSrc: Oral  SpO2: 97%  Weight: 160 lb 8 oz (72.8 kg)    Body mass index is 21.77 kg/m.   Physical Exam Vitals reviewed.  Constitutional:      Appearance: Normal appearance.  HENT:     Head: Normocephalic and  atraumatic.  Eyes:     Conjunctiva/sclera: Conjunctivae normal.     Pupils: Pupils are equal, round, and reactive to light.  Cardiovascular:     Rate and Rhythm: Normal rate and regular rhythm.  Pulmonary:     Effort: Pulmonary effort is normal.     Breath sounds: Normal breath sounds.  Skin:    General: Skin is warm and dry.  Neurological:     General: No focal deficit present.     Mental Status: He is alert and oriented to person, place, and time.  Psychiatric:  Mood and Affect: Mood normal.        Behavior: Behavior normal.        Thought Content: Thought content normal.        Judgment: Judgment normal.     Impression and Plan:  Hospital discharge follow-up  E. coli UTI  Paroxysmal atrial fibrillation  Dementia due to Alzheimer's disease  [facility] charts reviewed in detail. -He was admitted with an E. coli UTI and was successfully treated with antibiotics. -He had some issues with urinary retention but he passed a voiding trial and has seen urology as an outpatient. -He had new onset A-fib while hospitalized.  His chads VASC score was 2, however it was elected not to place him on anticoagulation given his age, dementia and fall risk. -His only complaint is continued weakness that is worse since coming home.  It appears home health was not arranged after hospital, will place order for home health PT.  Medical decision making of medium complexity was utilized today.      Chaya Jan, MD [place] [facility]

## 2022-10-22 DIAGNOSIS — R3914 Feeling of incomplete bladder emptying: Secondary | ICD-10-CM | POA: Diagnosis not present

## 2022-10-27 ENCOUNTER — Other Ambulatory Visit: Payer: Self-pay | Admitting: Internal Medicine

## 2022-10-27 DIAGNOSIS — F411 Generalized anxiety disorder: Secondary | ICD-10-CM

## 2022-10-27 DIAGNOSIS — M21371 Foot drop, right foot: Secondary | ICD-10-CM

## 2022-10-28 DIAGNOSIS — I480 Paroxysmal atrial fibrillation: Secondary | ICD-10-CM | POA: Diagnosis not present

## 2022-10-28 DIAGNOSIS — F01A0 Vascular dementia, mild, without behavioral disturbance, psychotic disturbance, mood disturbance, and anxiety: Secondary | ICD-10-CM | POA: Diagnosis not present

## 2022-10-28 DIAGNOSIS — C7951 Secondary malignant neoplasm of bone: Secondary | ICD-10-CM | POA: Diagnosis not present

## 2022-10-28 DIAGNOSIS — G309 Alzheimer's disease, unspecified: Secondary | ICD-10-CM | POA: Diagnosis not present

## 2022-10-28 DIAGNOSIS — F02A0 Dementia in other diseases classified elsewhere, mild, without behavioral disturbance, psychotic disturbance, mood disturbance, and anxiety: Secondary | ICD-10-CM | POA: Diagnosis not present

## 2022-10-28 DIAGNOSIS — C8586 Other specified types of non-Hodgkin lymphoma, intrapelvic lymph nodes: Secondary | ICD-10-CM | POA: Diagnosis not present

## 2022-10-30 ENCOUNTER — Telehealth: Payer: Self-pay | Admitting: Internal Medicine

## 2022-10-30 NOTE — Telephone Encounter (Signed)
Perry Nielsen from [facility] call and stated she need PT orders for 1 wk x 1 ,2 x for 3 wk's and 1 for 3 wk's  after that to work on gait balance and strength.Perry Nielsen want a call back at # [phone].

## 2022-11-02 NOTE — Telephone Encounter (Signed)
Liji informed of ok for orders

## 2022-11-03 ENCOUNTER — Ambulatory Visit: Payer: Medicare Other

## 2022-11-04 ENCOUNTER — Other Ambulatory Visit: Payer: Self-pay | Admitting: Internal Medicine

## 2022-11-04 DIAGNOSIS — I480 Paroxysmal atrial fibrillation: Secondary | ICD-10-CM | POA: Diagnosis not present

## 2022-11-04 DIAGNOSIS — C8586 Other specified types of non-Hodgkin lymphoma, intrapelvic lymph nodes: Secondary | ICD-10-CM | POA: Diagnosis not present

## 2022-11-04 DIAGNOSIS — C7951 Secondary malignant neoplasm of bone: Secondary | ICD-10-CM | POA: Diagnosis not present

## 2022-11-04 DIAGNOSIS — F02A0 Dementia in other diseases classified elsewhere, mild, without behavioral disturbance, psychotic disturbance, mood disturbance, and anxiety: Secondary | ICD-10-CM | POA: Diagnosis not present

## 2022-11-04 DIAGNOSIS — G309 Alzheimer's disease, unspecified: Secondary | ICD-10-CM | POA: Diagnosis not present

## 2022-11-04 DIAGNOSIS — F01A0 Vascular dementia, mild, without behavioral disturbance, psychotic disturbance, mood disturbance, and anxiety: Secondary | ICD-10-CM | POA: Diagnosis not present

## 2022-11-04 DIAGNOSIS — C8580 Other specified types of non-Hodgkin lymphoma, unspecified site: Secondary | ICD-10-CM

## 2022-11-04 MED ORDER — HYDROCODONE-ACETAMINOPHEN 5-325 MG PO TABS
1.0000 | ORAL_TABLET | Freq: Two times a day (BID) | ORAL | 0 refills | Status: DC | PRN
Start: 2022-11-04 — End: 2023-03-15

## 2022-11-04 MED ORDER — HYDROCODONE-ACETAMINOPHEN 5-325 MG PO TABS
1.0000 | ORAL_TABLET | Freq: Two times a day (BID) | ORAL | 0 refills | Status: DC | PRN
Start: 2022-11-04 — End: 2023-02-03

## 2022-11-04 NOTE — Telephone Encounter (Signed)
Refills sent

## 2022-11-04 NOTE — Telephone Encounter (Signed)
Prescription Request  11/04/2022  LOV: 10/21/2022  What is the name of the medication or equipment? HYDROcodone-acetaminophen (NORCO) 5-325 MG tablet   Have you contacted your pharmacy to request a refill? No   Which pharmacy would you like this sent to?  [id number] [place], [place] - [address] AT [facility] OF [facility] [address] & [facility] [address] [place] [address] Phone: [phone] Fax: [phone]    Patient notified that their request is being sent to the clinical staff for review and that they should receive a response within 2 business days.   Please advise at Mobile [phone] (mobile)

## 2022-11-06 DIAGNOSIS — C8586 Other specified types of non-Hodgkin lymphoma, intrapelvic lymph nodes: Secondary | ICD-10-CM | POA: Diagnosis not present

## 2022-11-06 DIAGNOSIS — G309 Alzheimer's disease, unspecified: Secondary | ICD-10-CM | POA: Diagnosis not present

## 2022-11-06 DIAGNOSIS — F02A0 Dementia in other diseases classified elsewhere, mild, without behavioral disturbance, psychotic disturbance, mood disturbance, and anxiety: Secondary | ICD-10-CM | POA: Diagnosis not present

## 2022-11-06 DIAGNOSIS — I480 Paroxysmal atrial fibrillation: Secondary | ICD-10-CM | POA: Diagnosis not present

## 2022-11-06 DIAGNOSIS — C7951 Secondary malignant neoplasm of bone: Secondary | ICD-10-CM | POA: Diagnosis not present

## 2022-11-06 DIAGNOSIS — F01A0 Vascular dementia, mild, without behavioral disturbance, psychotic disturbance, mood disturbance, and anxiety: Secondary | ICD-10-CM | POA: Diagnosis not present

## 2022-11-10 DIAGNOSIS — C8586 Other specified types of non-Hodgkin lymphoma, intrapelvic lymph nodes: Secondary | ICD-10-CM | POA: Diagnosis not present

## 2022-11-10 DIAGNOSIS — C7951 Secondary malignant neoplasm of bone: Secondary | ICD-10-CM | POA: Diagnosis not present

## 2022-11-10 DIAGNOSIS — F02A0 Dementia in other diseases classified elsewhere, mild, without behavioral disturbance, psychotic disturbance, mood disturbance, and anxiety: Secondary | ICD-10-CM | POA: Diagnosis not present

## 2022-11-10 DIAGNOSIS — I480 Paroxysmal atrial fibrillation: Secondary | ICD-10-CM | POA: Diagnosis not present

## 2022-11-10 DIAGNOSIS — G309 Alzheimer's disease, unspecified: Secondary | ICD-10-CM | POA: Diagnosis not present

## 2022-11-10 DIAGNOSIS — F01A0 Vascular dementia, mild, without behavioral disturbance, psychotic disturbance, mood disturbance, and anxiety: Secondary | ICD-10-CM | POA: Diagnosis not present

## 2022-11-16 DIAGNOSIS — I480 Paroxysmal atrial fibrillation: Secondary | ICD-10-CM | POA: Diagnosis not present

## 2022-11-16 DIAGNOSIS — G309 Alzheimer's disease, unspecified: Secondary | ICD-10-CM | POA: Diagnosis not present

## 2022-11-16 DIAGNOSIS — C8586 Other specified types of non-Hodgkin lymphoma, intrapelvic lymph nodes: Secondary | ICD-10-CM | POA: Diagnosis not present

## 2022-11-16 DIAGNOSIS — F02A0 Dementia in other diseases classified elsewhere, mild, without behavioral disturbance, psychotic disturbance, mood disturbance, and anxiety: Secondary | ICD-10-CM | POA: Diagnosis not present

## 2022-11-16 DIAGNOSIS — C7951 Secondary malignant neoplasm of bone: Secondary | ICD-10-CM | POA: Diagnosis not present

## 2022-11-16 DIAGNOSIS — F01A0 Vascular dementia, mild, without behavioral disturbance, psychotic disturbance, mood disturbance, and anxiety: Secondary | ICD-10-CM | POA: Diagnosis not present

## 2022-11-19 DIAGNOSIS — I480 Paroxysmal atrial fibrillation: Secondary | ICD-10-CM | POA: Diagnosis not present

## 2022-11-19 DIAGNOSIS — C7951 Secondary malignant neoplasm of bone: Secondary | ICD-10-CM | POA: Diagnosis not present

## 2022-11-19 DIAGNOSIS — F02A0 Dementia in other diseases classified elsewhere, mild, without behavioral disturbance, psychotic disturbance, mood disturbance, and anxiety: Secondary | ICD-10-CM | POA: Diagnosis not present

## 2022-11-19 DIAGNOSIS — F01A0 Vascular dementia, mild, without behavioral disturbance, psychotic disturbance, mood disturbance, and anxiety: Secondary | ICD-10-CM | POA: Diagnosis not present

## 2022-11-19 DIAGNOSIS — C8586 Other specified types of non-Hodgkin lymphoma, intrapelvic lymph nodes: Secondary | ICD-10-CM | POA: Diagnosis not present

## 2022-11-19 DIAGNOSIS — G309 Alzheimer's disease, unspecified: Secondary | ICD-10-CM | POA: Diagnosis not present

## 2022-11-23 ENCOUNTER — Telehealth: Payer: Self-pay | Admitting: Internal Medicine

## 2022-11-23 DIAGNOSIS — G309 Alzheimer's disease, unspecified: Secondary | ICD-10-CM | POA: Diagnosis not present

## 2022-11-23 DIAGNOSIS — C8586 Other specified types of non-Hodgkin lymphoma, intrapelvic lymph nodes: Secondary | ICD-10-CM | POA: Diagnosis not present

## 2022-11-23 DIAGNOSIS — C7951 Secondary malignant neoplasm of bone: Secondary | ICD-10-CM | POA: Diagnosis not present

## 2022-11-23 DIAGNOSIS — F02A0 Dementia in other diseases classified elsewhere, mild, without behavioral disturbance, psychotic disturbance, mood disturbance, and anxiety: Secondary | ICD-10-CM | POA: Diagnosis not present

## 2022-11-23 DIAGNOSIS — F01A0 Vascular dementia, mild, without behavioral disturbance, psychotic disturbance, mood disturbance, and anxiety: Secondary | ICD-10-CM | POA: Diagnosis not present

## 2022-11-23 DIAGNOSIS — I480 Paroxysmal atrial fibrillation: Secondary | ICD-10-CM | POA: Diagnosis not present

## 2022-11-23 NOTE — Telephone Encounter (Signed)
Dianca with [facility] [phone] Calling with BP concerns and to request advice.  [name] BP is 98/68 at rest - Pt has been steady at lower BP since 10/28/22  Pt is currently taking  metoprolol tartrate metoprolol tartrate (LOPRESSOR) 25 MG tablet  Most Recent BPs: 4/24 118/60 5/1 124/86 5/3 118/74 5/07 104/66 5/13 112/80  PLEASE ADVISE.

## 2022-11-24 NOTE — Telephone Encounter (Signed)
Dr Hardie Shackleton recommendation given to  Perry Nielsen with [facility]

## 2022-12-01 ENCOUNTER — Other Ambulatory Visit: Payer: Self-pay | Admitting: Internal Medicine

## 2022-12-01 ENCOUNTER — Telehealth: Payer: Self-pay | Admitting: Internal Medicine

## 2022-12-01 DIAGNOSIS — E039 Hypothyroidism, unspecified: Secondary | ICD-10-CM

## 2022-12-01 NOTE — Telephone Encounter (Signed)
Extend home health PT 1x4

## 2022-12-01 NOTE — Telephone Encounter (Signed)
Left detailed message on machine for Perry Nielsen with verbal orders for PT.

## 2022-12-02 DIAGNOSIS — G309 Alzheimer's disease, unspecified: Secondary | ICD-10-CM | POA: Diagnosis not present

## 2022-12-02 DIAGNOSIS — C7951 Secondary malignant neoplasm of bone: Secondary | ICD-10-CM | POA: Diagnosis not present

## 2022-12-02 DIAGNOSIS — I480 Paroxysmal atrial fibrillation: Secondary | ICD-10-CM | POA: Diagnosis not present

## 2022-12-02 DIAGNOSIS — F02A0 Dementia in other diseases classified elsewhere, mild, without behavioral disturbance, psychotic disturbance, mood disturbance, and anxiety: Secondary | ICD-10-CM | POA: Diagnosis not present

## 2022-12-02 DIAGNOSIS — F01A0 Vascular dementia, mild, without behavioral disturbance, psychotic disturbance, mood disturbance, and anxiety: Secondary | ICD-10-CM | POA: Diagnosis not present

## 2022-12-02 DIAGNOSIS — C8586 Other specified types of non-Hodgkin lymphoma, intrapelvic lymph nodes: Secondary | ICD-10-CM | POA: Diagnosis not present

## 2022-12-07 DIAGNOSIS — F02A0 Dementia in other diseases classified elsewhere, mild, without behavioral disturbance, psychotic disturbance, mood disturbance, and anxiety: Secondary | ICD-10-CM | POA: Diagnosis not present

## 2022-12-07 DIAGNOSIS — F01A0 Vascular dementia, mild, without behavioral disturbance, psychotic disturbance, mood disturbance, and anxiety: Secondary | ICD-10-CM | POA: Diagnosis not present

## 2022-12-07 DIAGNOSIS — C8586 Other specified types of non-Hodgkin lymphoma, intrapelvic lymph nodes: Secondary | ICD-10-CM | POA: Diagnosis not present

## 2022-12-07 DIAGNOSIS — C7951 Secondary malignant neoplasm of bone: Secondary | ICD-10-CM | POA: Diagnosis not present

## 2022-12-07 DIAGNOSIS — G309 Alzheimer's disease, unspecified: Secondary | ICD-10-CM | POA: Diagnosis not present

## 2022-12-07 DIAGNOSIS — I480 Paroxysmal atrial fibrillation: Secondary | ICD-10-CM | POA: Diagnosis not present

## 2022-12-10 ENCOUNTER — Telehealth: Payer: Self-pay | Admitting: Internal Medicine

## 2022-12-10 NOTE — Telephone Encounter (Signed)
Spoke with pharmacist and refill will be ready today. Left detailed message on machine for patient.

## 2022-12-10 NOTE — Telephone Encounter (Signed)
Prescription Request  12/10/2022  LOV: 10/21/2022  What is the name of the medication or equipment? HYDROcodone-acetaminophen (NORCO) 5-325 MG tablet   Have you contacted your pharmacy to request a refill? No   Which pharmacy would you like this sent to?  [facility] #[id number] [place], [place] - [address] AT [facility] OF [facility] [address] & [facility] [address] [place] [address] Phone: [phone] Fax: [phone]    Patient notified that their request is being sent to the clinical staff for review and that they should receive a response within 2 business days.   Please advise at Mobile [phone] (mobile)

## 2022-12-14 ENCOUNTER — Ambulatory Visit: Payer: Medicare Other | Admitting: Physical Therapy

## 2022-12-17 DIAGNOSIS — C7951 Secondary malignant neoplasm of bone: Secondary | ICD-10-CM | POA: Diagnosis not present

## 2022-12-17 DIAGNOSIS — C8586 Other specified types of non-Hodgkin lymphoma, intrapelvic lymph nodes: Secondary | ICD-10-CM | POA: Diagnosis not present

## 2022-12-17 DIAGNOSIS — G309 Alzheimer's disease, unspecified: Secondary | ICD-10-CM | POA: Diagnosis not present

## 2022-12-17 DIAGNOSIS — F01A0 Vascular dementia, mild, without behavioral disturbance, psychotic disturbance, mood disturbance, and anxiety: Secondary | ICD-10-CM | POA: Diagnosis not present

## 2022-12-17 DIAGNOSIS — F02A0 Dementia in other diseases classified elsewhere, mild, without behavioral disturbance, psychotic disturbance, mood disturbance, and anxiety: Secondary | ICD-10-CM | POA: Diagnosis not present

## 2022-12-17 DIAGNOSIS — I480 Paroxysmal atrial fibrillation: Secondary | ICD-10-CM | POA: Diagnosis not present

## 2022-12-20 ENCOUNTER — Other Ambulatory Visit: Payer: Self-pay | Admitting: Internal Medicine

## 2022-12-22 ENCOUNTER — Ambulatory Visit (INDEPENDENT_AMBULATORY_CARE_PROVIDER_SITE_OTHER): Payer: Medicare Other | Admitting: Internal Medicine

## 2022-12-22 ENCOUNTER — Encounter: Payer: Self-pay | Admitting: Internal Medicine

## 2022-12-22 VITALS — BP 120/80 | HR 113 | Temp 97.9°F | Wt 157.2 lb

## 2022-12-22 DIAGNOSIS — M21371 Foot drop, right foot: Secondary | ICD-10-CM

## 2022-12-22 NOTE — Addendum Note (Signed)
Addended by: Kern Reap B on: 12/22/2022 04:26 PM   Modules accepted: Orders

## 2022-12-22 NOTE — Progress Notes (Signed)
Established Patient Office Visit     CC/Reason for Visit: "I need a new AFO brace"  HPI: Perry Nielsen. is a 82 y.o. male who is coming in today for the above mentioned reasons. 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  He has worn a right AFO brace for 8 to 10 years.  He recently started seeing a physical therapist who advised him to see me for a new AFO prescription.   Past Medical/Surgical History: Past Medical History:  Diagnosis Date   ALLERGIC RHINITIS 08/03/2007   BACK PAIN 09/18/2009   Basal cell carcinoma 08/21/2004   right wing nose (MOHS)   BCC (basal cell carcinoma of skin) 06/07/2001   mid upper back   BCC (basal cell carcinoma) 03/08/2013   right forehead (curet & carter)   BPH associated with nocturia 02/18/2007   Qualifier: Diagnosis of  By: Briscoe Burns CMA, Alvy Beal     CAROTID BRUIT, LEFT 08/07/2008   ERECTILE DYSFUNCTION, MILD 08/07/2008   Foot drop    rt foot   GERD 02/18/2007   HAMMER TOE 08/03/2007   Hepatitis 1977   no residual problems, type A   KNEE PAIN, LEFT, CHRONIC 08/07/2008   Lymphoma (HCC)    right hip   Rash, skin    torso   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   right nose (caulery, 43fu)   SCC (squamous cell carcinoma) 11/24/2016   left upper arm (curetx3, 26fu)   SCC (squamous cell carcinoma) 04/20/2017   mid upper forehead  (tx after bx)   SCC (squamous cell carcinoma) 05/11/2019   left scapha   SCC (squamous cell carcinoma) 08/17/2019   mid back   Skin cancer    Sleeping difficulty    Squamous cell carcinoma of skin 07/31/2003   right brow (curetx3, 19fu)    Past Surgical History:  Procedure Laterality Date   CATARACT EXTRACTION W/ INTRAOCULAR LENS  IMPLANT, BILATERAL Bilateral    KNEE ARTHROSCOPY     left   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 He has never used smokeless tobacco. He reports current alcohol use. He reports that he does not use drugs.  Allergies: Allergies  Allergen Reactions   Antihistamines, Diphenhydramine-Type Swelling and Other (See Comments)    CAUSED PROSTATE TO SWELL & as a child had hyperactivity when took Benadryl Pt took Pepcid w/ Benadryl PO and did fine per Pt & his wife    Family History:  Family History  Problem Relation Age of Onset   Pulmonary fibrosis Mother    Uterine cancer Mother    Prostate cancer Father    Heart disease Brother    Heart attack Other    Aneurysm Other        aortic   Coronary artery disease Sister      Current Outpatient Medications:    amitriptyline (ELAVIL) 25 MG tablet, TAKE 1 TABLET(25 MG) BY MOUTH AT BEDTIME, Disp: 90 tablet, Rfl: 1   aspirin EC 81 MG tablet, Take 81 mg by mouth in the morning. Swallow whole., Disp: , Rfl:  donepezil (ARICEPT) 10 MG tablet, Take one tablet at 10 mg daily (Patient taking differently: Take 10 mg by mouth in the morning. Take one tablet at 10 mg daily), Disp: 30 tablet, Rfl: 11   HYDROcodone-acetaminophen (NORCO) 5-325 MG tablet, Take 1 tablet by mouth 2 (two) times daily as needed for moderate pain., Disp: 60 tablet, Rfl: 0   HYDROcodone-acetaminophen (NORCO) 5-325 MG tablet, Take 1 tablet by mouth 2 (two) times daily as needed for moderate pain., Disp: 60 tablet, Rfl: 0   HYDROcodone-acetaminophen (NORCO) 5-325 MG tablet, Take 1 tablet by mouth 2 (two) times daily as needed for moderate pain., Disp: 60 tablet, Rfl: 0   ibuprofen (ADVIL) 200 MG tablet, Take 1,000-1,200 mg by mouth daily as needed (for pain)., Disp: , Rfl:    levothyroxine (SYNTHROID) 25 MCG tablet, TAKE 1 TABLET(25 MCG) BY MOUTH DAILY BEFORE BREAKFAST,  Disp: 90 tablet, Rfl: 0   LORazepam (ATIVAN) 0.5 MG tablet, TAKE 1 TABLET(0.5 MG) BY MOUTH TWICE DAILY AS NEEDED FOR ANXIETY, Disp: 60 tablet, Rfl: 1   metoprolol tartrate (LOPRESSOR) 25 MG tablet, Take 0.5 tablets (12.5 mg total) by mouth 2 (two) times daily. Hold dose if blood pressure is less than 90/60 or if heart rate is less than 60, Disp: 60 tablet, Rfl: 3   NON FORMULARY, Apply 1 application  topically See admin instructions. Nervive Pain Relieving Cream- Apply to the right foot every 8 hours as needed for pain, Disp: , Rfl:    Polyethyl Glycol-Propyl Glycol (SYSTANE OP), Place 1 drop into both eyes daily as needed (for dryness)., Disp: , Rfl:    Polyethylene Glycol 3350 (MIRALAX PO), Take 17 g by mouth daily in the afternoon., Disp: , Rfl:    silodosin (RAPAFLO) 8 MG CAPS capsule, Take 8 mg by mouth daily., Disp: , Rfl:   Review of Systems:  Negative unless indicated in HPI.   Physical Exam: Vitals:   12/22/22 1531  BP: 120/80  Pulse: (!) 113  Temp: 97.9 F (36.6 C)  TempSrc: Oral  SpO2: 96%  Weight: 157 lb 3.2 oz (71.3 kg)    Body mass index is 21.32 kg/m.   Physical Exam Vitals reviewed.  Constitutional:      Appearance: Normal appearance.  HENT:     Head: Normocephalic and atraumatic.  Eyes:     Conjunctiva/sclera: Conjunctivae normal.     Pupils: Pupils are equal, round, and reactive to light.  Skin:    General: Skin is warm and dry.  Neurological:     General: No focal deficit present.     Mental Status: He is alert and oriented to person, place, and time.  Psychiatric:        Mood and Affect: Mood normal.        Behavior: Behavior normal.        Thought Content: Thought content normal.        Judgment: Judgment normal.      Impression and Plan:  Right foot drop  -He has a chronic right foot drop and as such needs an AFO brace.   Time spent:20 minutes reviewing chart, interviewing and examining patient and formulating plan of  care.     Chaya Jan, MD [facility]

## 2022-12-30 ENCOUNTER — Other Ambulatory Visit: Payer: Self-pay | Admitting: Internal Medicine

## 2022-12-30 DIAGNOSIS — F411 Generalized anxiety disorder: Secondary | ICD-10-CM

## 2022-12-30 DIAGNOSIS — M21371 Foot drop, right foot: Secondary | ICD-10-CM

## 2023-01-18 ENCOUNTER — Telehealth: Payer: Self-pay | Admitting: Internal Medicine

## 2023-01-18 NOTE — Telephone Encounter (Signed)
Spoke to the pharmacist and the refill will be ready this afternoon.  Wife is aware.

## 2023-01-18 NOTE — Telephone Encounter (Signed)
Prescription Request  01/18/2023  LOV: 12/22/2022  What is the name of the medication or equipment? HYDROcodone-acetaminophen (NORCO) 5-325 MG tablet   Have you contacted your pharmacy to request a refill? No   Which pharmacy would you like this sent to?  [id number] [place], [place] - [address] AT [facility] OF [facility] [address] & [facility] [address] [place] [address] Phone: [phone] Fax: [phone]    Patient notified that their request is being sent to the clinical staff for review and that they should receive a response within 2 business days.   Please advise at Mobile [phone] (mobile)

## 2023-02-03 ENCOUNTER — Ambulatory Visit: Payer: Medicare Other | Admitting: Physician Assistant

## 2023-02-03 ENCOUNTER — Encounter: Payer: Self-pay | Admitting: Physician Assistant

## 2023-02-03 VITALS — BP 88/57 | HR 74 | Resp 20 | Ht 71.5 in | Wt 157.0 lb

## 2023-02-03 DIAGNOSIS — G309 Alzheimer's disease, unspecified: Secondary | ICD-10-CM

## 2023-02-03 DIAGNOSIS — F0280 Dementia in other diseases classified elsewhere without behavioral disturbance: Secondary | ICD-10-CM

## 2023-02-03 NOTE — Patient Instructions (Addendum)
It was a pleasure to see you today at our office.   Recommendations:  Follow up in 3 month Continue taking donepezil 10 mg daily    Whom to call:  Memory  decline, memory medications: Call our office [phone]   For psychiatric meds, mood meds: Please have your primary care physician manage these medications.   Counseling regarding caregiver distress, including caregiver depression, anxiety and issues regarding community resources, adult day care programs, adult living facilities, or memory care questions:   Feel free to contact Misty Lisabeth Register, Social Worker at [phone]   For assessment of decision of mental capacity and competency:  Call Dr. Erick Blinks, geriatric psychiatrist at [phone]  For guidance in geriatric dementia issues please call [facility] [phone]  For guidance regarding [facility] and if placement were needed at the facility, contact Sidney Ace, Social Worker tel: [phone]  If you have any severe symptoms of a stroke, or other severe issues such as confusion,severe chills or fever, etc call 911 or go to the ER as you may need to be evaluated further      RECOMMENDATIONS FOR ALL PATIENTS WITH MEMORY PROBLEMS: 1. Continue to exercise (Recommend 30 minutes of walking everyday, or 3 hours every week) 2. Increase social interactions - continue going to [place] and enjoy social gatherings with friends and family 3. Eat healthy, avoid fried foods and eat more fruits and vegetables 4. Maintain adequate blood pressure, blood sugar, and blood cholesterol level. Reducing the risk of stroke and cardiovascular disease also helps promoting better memory. 5. Avoid stressful situations. Live a simple life and avoid aggravations. Organize your time and prepare for the next day in anticipation. 6. Sleep well, avoid any interruptions of sleep and avoid any distractions in the bedroom that may interfere with adequate  sleep quality 7. Avoid sugar, avoid sweets as there is a strong link between excessive sugar intake, diabetes, and cognitive impairment We discussed the Mediterranean diet, which has been shown to help patients reduce the risk of progressive memory disorders and reduces cardiovascular risk. This includes eating fish, eat fruits and green leafy vegetables, nuts like almonds and hazelnuts, walnuts, and also use olive oil. Avoid fast foods and fried foods as much as possible. Avoid sweets and sugar as sugar use has been linked to worsening of memory function.  There is always a concern of gradual progression of memory problems. If this is the case, then we may need to adjust level of care according to patient needs. Support, both to the patient and caregiver, should then be put into place.     FALL PRECAUTIONS: Be cautious when walking. Scan the area for obstacles that may increase the risk of trips and falls. When getting up in the mornings, sit up at the edge of the bed for a few minutes before getting out of bed. Consider elevating the bed at the head end to avoid drop of blood pressure when getting up. Walk always in a well-lit room (use night lights in the walls). Avoid area rugs or power cords from appliances in the middle of the walkways. Use a walker or a cane if necessary and consider physical therapy for balance exercise. Get your eyesight checked regularly.  FINANCIAL OVERSIGHT: Supervision, especially oversight when making financial decisions or transactions is also recommended.  HOME SAFETY: Consider the safety of the kitchen when operating appliances like stoves, microwave oven, and blender. Consider having supervision and share cooking responsibilities until no longer able  to participate in those. Accidents with firearms and other hazards in the house should be identified and addressed as well.   ABILITY TO BE LEFT ALONE: If patient is unable to contact 911 operator, consider using  Life[facility], or when the need is there, arrange for someone to stay with patients. Smoking is a fire hazard, consider supervision or cessation. Risk of wandering should be assessed by caregiver and if detected at any point, supervision and safe proof recommendations should be instituted.  MEDICATION SUPERVISION: Inability to self-administer medication needs to be constantly addressed. Implement a mechanism to ensure safe administration of the medications.   DRIVING: Regarding driving, in patients with progressive memory problems, driving will be impaired. We advise to have someone else do the driving if trouble finding directions or if minor accidents are reported. Independent driving assessment is available to determine safety of driving.   If you are interested in the driving assessment, you can contact the following:  [employer] in [place] [phone]  [facility] [phone]  [facility] [phone] or [phone]    Mediterranean Diet A Mediterranean diet refers to food and lifestyle choices that are based on the traditions of countries located on the [employer]. This way of eating has been shown to help prevent certain conditions and improve outcomes for people who have chronic diseases, like kidney disease and heart disease. What are tips for following this plan? Lifestyle  Cook and eat meals together with your family, when possible. Drink enough fluid to keep your urine clear or pale yellow. Be physically active every day. This includes: Aerobic exercise like running or swimming. Leisure activities like gardening, walking, or housework. Get 7-8 hours of sleep each night. If recommended by your health care provider, drink red wine in moderation. This means 1 glass a day for nonpregnant women and 2 glasses a day for men. A glass of wine equals 5 oz (150 mL). Reading food labels  Check the  serving size of packaged foods. For foods such as rice and pasta, the serving size refers to the amount of cooked product, not dry. Check the total fat in packaged foods. Avoid foods that have saturated fat or trans fats. Check the ingredients list for added sugars, such as corn syrup. Shopping  At the grocery store, buy most of your food from the areas near the walls of the store. This includes: Fresh fruits and vegetables (produce). Grains, beans, nuts, and seeds. Some of these may be available in unpackaged forms or large amounts (in bulk). Fresh seafood. Poultry and eggs. Low-fat dairy products. Buy whole ingredients instead of prepackaged foods. Buy fresh fruits and vegetables in-season from local farmers markets. Buy frozen fruits and vegetables in resealable bags. If you do not have access to quality fresh seafood, buy precooked frozen shrimp or canned fish, such as tuna, salmon, or sardines. Buy small amounts of raw or cooked vegetables, salads, or olives from the deli or salad bar at your store. Stock your pantry so you always have certain foods on hand, such as olive oil, canned tuna, canned tomatoes, rice, pasta, and beans. Cooking  Cook foods with extra-virgin olive oil instead of using butter or other vegetable oils. Have meat as a side dish, and have vegetables or grains as your main dish. This means having meat in small portions or adding small amounts of meat to foods like pasta or stew. Use beans or vegetables instead of meat in common dishes like  chili or lasagna. Experiment with different cooking methods. Try roasting or broiling vegetables instead of steaming or sauteing them. Add frozen vegetables to soups, stews, pasta, or rice. Add nuts or seeds for added healthy fat at each meal. You can add these to yogurt, salads, or vegetable dishes. Marinate fish or vegetables using olive oil, lemon juice, garlic, and fresh herbs. Meal planning  Plan to eat 1 vegetarian meal one  day each week. Try to work up to 2 vegetarian meals, if possible. Eat seafood 2 or more times a week. Have healthy snacks readily available, such as: Vegetable sticks with hummus. Greek yogurt. Fruit and nut trail mix. Eat balanced meals throughout the week. This includes: Fruit: 2-3 servings a day Vegetables: 4-5 servings a day Low-fat dairy: 2 servings a day Fish, poultry, or lean meat: 1 serving a day Beans and legumes: 2 or more servings a week Nuts and seeds: 1-2 servings a day Whole grains: 6-8 servings a day Extra-virgin olive oil: 3-4 servings a day Limit red meat and sweets to only a few servings a month What are my food choices? Mediterranean diet Recommended Grains: Whole-grain pasta. Brown rice. Bulgar wheat. Polenta. Couscous. Whole-wheat bread. Orpah Cobb. Vegetables: Artichokes. Beets. Broccoli. Cabbage. Carrots. Eggplant. Green beans. Chard. Kale. Spinach. Onions. Leeks. Peas. Squash. Tomatoes. Peppers. Radishes. Fruits: Apples. Apricots. Avocado. Berries. Bananas. Cherries. Dates. Figs. Grapes. Lemons. Melon. Oranges. Peaches. Plums. Pomegranate. Meats and other protein foods: Beans. Almonds. Sunflower seeds. Pine nuts. Peanuts. Cod. Salmon. Scallops. Shrimp. Tuna. Tilapia. Clams. Oysters. Eggs. Dairy: Low-fat milk. Cheese. Greek yogurt. Beverages: Water. Red wine. Herbal tea. Fats and oils: Extra virgin olive oil. Avocado oil. Grape seed oil. Sweets and desserts: [place] yogurt with honey. Baked apples. Poached pears. Trail mix. Seasoning and other foods: Basil. Cilantro. Coriander. Cumin. Mint. Parsley. Sage. Rosemary. Tarragon. Garlic. Oregano. Thyme. Pepper. Balsalmic vinegar. Tahini. Hummus. Tomato sauce. Olives. Mushrooms. Limit these Grains: Prepackaged pasta or rice dishes. Prepackaged cereal with added sugar. Vegetables: Deep fried potatoes (french fries). Fruits: Fruit canned in syrup. Meats and other protein foods: Beef. Pork. Lamb. Poultry with skin.  Hot dogs. Tomasa Blase. Dairy: Ice cream. Sour cream. Whole milk. Beverages: Juice. Sugar-sweetened soft drinks. Beer. Liquor and spirits. Fats and oils: Butter. Canola oil. Vegetable oil. Beef fat (tallow). Lard. Sweets and desserts: Cookies. Cakes. Pies. Candy. Seasoning and other foods: Mayonnaise. Premade sauces and marinades. The items listed may not be a complete list. Talk with your dietitian about what dietary choices are right for you. Summary The Mediterranean diet includes both food and lifestyle choices. Eat a variety of fresh fruits and vegetables, beans, nuts, seeds, and whole grains. Limit the amount of red meat and sweets that you eat. Talk with your health care provider about whether it is safe for you to drink red wine in moderation. This means 1 glass a day for nonpregnant women and 2 glasses a day for men. A glass of wine equals 5 oz (150 mL). This information is not intended to replace advice given to you by your health care provider. Make sure you discuss any questions you have with your health care provider. Document Released: 02/13/2016 Document Revised: 03/17/2016 Document Reviewed: 02/13/2016 Elsevier Interactive Patient Education  2017 [employer].

## 2023-02-03 NOTE — Progress Notes (Incomplete)
Assessment/Plan:   Memory Impairment   Perry Nielsen. is a very pleasant 82 y.o. RH male with a history ofhypertension, hyperlipidemia, IBS, vitamin B12 deficiency, remote history of lymphoma, insomnia, hypothyroidism  presenting today in follow-up for evaluation of memory loss. Patient is on donepezil 10 mg daily.***     Recommendations:   Follow up in 6  months. Recommend good control of cardiovascular risk factors Continue to control mood as per PCP    Subjective:   This patient is accompanied in the office by his wife***  who supplements the history. Previous records as well as any outside records available were reviewed prior to todays visit.   Patient was last seen on 08/05/2022 with MMSE 24/30***    Any changes in memory since last visit? "About the same". Patient has some difficulty remembering recent conversations and people names   He is not doing crossword puzzles. repeats oneself?  Endorsed Disoriented when walking into a room?  Patient denies   Leaving objects in unusual places?  Patient denies   Wandering behavior?   denies   Any personality changes since last visit?   denies   Any worsening depression?: has some moments. HE is not on antidepressants because of recent hospitalization for UTI requiring hospitialzation  Hallucinations or paranoia?  denies   Seizures?   denies    Any sleep changes? Sleeps well.  Denies vivid dreams, REM behavior or sleepwalking.  He takes amitriptyline and lorazepam which helps. Sleep apnea?   denies ***  Any hygiene concerns?   denies   Independent of bathing and dressing? *** Endorsed  Does the patient needs help with medications?  Wife is in charge *** Who is in charge of the finances?  Wife is in charge   *** Any changes in appetite?  denies.  Admits to not drinking enough water.***   Patient have trouble swallowing?  denies   Does the patient cook?  No    Any headaches?    denies   Vision changes? denies Chronic  pain?  denies   Ambulates with difficulty?  He has chronic R foot drop which may limit his mobility***  Recent falls or head injuries?    denies      Unilateral weakness, numbness or tingling?   denies   Any tremors?  He has a history of minimal right hand tremor Any anosmia?    denies   Any incontinence of urine?  denies   Any bowel dysfunction?  Occasional constipation Patient lives with his wife   *** Does the patient drive?  Yes, denies any issues.  Initial visit 12/31/2021   How long did patient have memory difficulties? 2 years ago he began repeating himself.  Then he began having problems with short-term memory.  He also began forgetting the names of his close friends.  The other day he went to a funeral, and along his way home he has his wife "who died?  ".  Sometimes he asked his wife who lives with them?. Patient lives with: Spouse   repeats oneself? Endorsed, "he is asking the same questions 1 minute apart "-wife says. Disoriented when walking into a room?  A few months ago, he was concerned because he was not at home (while he was).  He was looking for his house in [place] (he never had a house there).  Leaving objects in unusual places?  Patient denies   Ambulates  with difficulty?  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  Patient denies   Any head injuries?  Patient denies   History of seizures?   Patient denies   Wandering behavior?  Patient denies   Patient drives?  The patient was a Landscape architect, and continues to drive around town without any issues.  He denies getting lost.  Any mood changes. He is more argumentative than before .  Any history of depression?:  Patient denies   Hallucinations?  Patient denies  Paranoia?  Patient denies   Patient reports that he sleeps well without vivid dreams, REM behavior or sleepwalking    History of sleep apnea?  Patient denies   Any hygiene concerns?  Patient denies    Independent of bathing and dressing?  Endorsed  Does the patient needs help with medications? Wife in charge  Who is in charge of the finances?  Patient is in charge  Any changes in appetite?  Patient denies   Patient have trouble swallowing? Patient denies   Does the patient cook?  Patient denies   Any kitchen accidents such as leaving the stove on? Patient denies   Any headaches?  Patient denies   The double vision? Patient denies   Any focal numbness or tingling?  Patient denies   Chronic back pain Patient denies   Unilateral weakness?  He has a history of chronic right foot drop, but no other areas of numbness or weakness. Any tremors?  R hand minimal tremor  Any history of anosmia?  Patient denies   Any incontinence of urine?  Patient denies   Any bowel dysfunction?   He has a history of IBS, occasionally he has constipation and diarrhea. History of heavy alcohol intake?  Patient denies   History of heavy tobacco use?  Patient denies   Family history of dementia?  Strong family history of Alzheimer's disease in father, sister and brother.    MRI brain personally reviewed was without evidence of acute intracranial abnormality, but mild chronic small vessel ischemic disease has slightly progressed from prior MRI in 2017.  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   Past Medical History:  Diagnosis Date   ALLERGIC RHINITIS 08/03/2007   BACK PAIN 09/18/2009   Basal cell carcinoma 08/21/2004   right wing nose (MOHS)   BCC (basal cell carcinoma of skin) 06/07/2001   mid upper back   BCC (basal cell carcinoma) 03/08/2013   right forehead (curet & carter)   BPH associated with nocturia 02/18/2007   Qualifier: Diagnosis of  By: Briscoe Burns CMA, Alvy Beal     CAROTID BRUIT, LEFT 08/07/2008   ERECTILE DYSFUNCTION, MILD 08/07/2008   Foot drop    rt foot   GERD 02/18/2007    HAMMER TOE 08/03/2007   Hepatitis 1977   no residual problems, type A   KNEE PAIN, LEFT, CHRONIC 08/07/2008   Lymphoma (HCC)    right hip   Rash, skin    torso   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   right nose (caulery, 75fu)   SCC (squamous cell carcinoma) 11/24/2016   left upper arm (curetx3, 53fu)   SCC (squamous cell carcinoma) 04/20/2017   mid upper forehead  (tx after bx)   SCC (squamous cell carcinoma) 05/11/2019   left scapha   SCC (squamous cell carcinoma) 08/17/2019   mid back   Skin cancer    Sleeping difficulty  Squamous cell carcinoma of skin 07/31/2003   right brow (curetx3, 50fu)     Past Surgical History:  Procedure Laterality Date   CATARACT EXTRACTION W/ INTRAOCULAR LENS  IMPLANT, BILATERAL Bilateral    KNEE ARTHROSCOPY     left   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 Swallow whole.   donepezil (ARICEPT) 10 MG tablet Take one tablet at 10 mg daily (Patient taking differently: Take 10 mg by mouth in the morning. Take one tablet at 10 mg daily)   HYDROcodone-acetaminophen (NORCO) 5-325 MG tablet Take 1 tablet by mouth 2 (two) times daily as needed for moderate pain.   ibuprofen (ADVIL) 200 MG tablet Take 1,000-1,200 mg by mouth daily as needed (for pain).   levothyroxine (SYNTHROID) 25 MCG tablet TAKE 1 TABLET(25 MCG) BY MOUTH DAILY BEFORE BREAKFAST   LORazepam (ATIVAN) 0.5 MG tablet TAKE 1 TABLET(0.5 MG) BY MOUTH TWICE DAILY AS NEEDED FOR  ANXIETY   metoprolol tartrate (LOPRESSOR) 25 MG tablet Take 0.5 tablets (12.5 mg total) by mouth 2 (two) times daily. Hold dose if blood pressure is less than 90/60 or if heart rate is less than 60   NON FORMULARY Apply 1 application  topically See admin instructions. Nervive Pain Relieving Cream- Apply to the right foot every 8 hours as needed for pain   Polyethyl Glycol-Propyl Glycol (SYSTANE OP) Place 1 drop into both eyes daily as needed (for dryness).   Polyethylene Glycol 3350 (MIRALAX PO) Take 17 g by mouth daily in the afternoon.   silodosin (RAPAFLO) 8 MG CAPS capsule Take 8 mg by mouth daily.   [DISCONTINUED] HYDROcodone-acetaminophen (NORCO) 5-325 MG tablet Take 1 tablet by mouth 2 (two) times daily as needed for moderate pain.   [DISCONTINUED] HYDROcodone-acetaminophen (NORCO) 5-325 MG tablet Take 1 tablet by mouth 2 (two) times daily as needed for moderate pain.   No facility-administered encounter medications on file as of 02/03/2023.     Objective:     PHYSICAL EXAMINATION:    VITALS:   Vitals:   02/03/23 1503  BP: (!) 88/57  Pulse: 74  Resp: 20  SpO2: 96%  Weight: 157 lb (71.2 kg)  Height: 5' 11.5" (1.816 m)    GEN:  The patient appears stated age and is in NAD. HEENT:  Normocephalic, atraumatic.   Neurological examination:  General: NAD, well-groomed, appears stated age. Orientation: The patient is alert. Oriented to person, place and not to date Cranial nerves: There is good facial symmetry.The speech is fluent and clear. No aphasia or dysarthria. Fund of knowledge is appropriate. Recent memory impaired and remote memory is normal.  Attention and concentration are normal.  Able to name objects and repeat phrases.  Hearing is intact to conversational tone ***.   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  Naming (0/3) 3  Attention: Read list of digits (0/2) 2  Attention: Read list of letters (0/1) 1  Attention: Serial 7 subtraction starting at 100 (0/3) 3  Language: Repeat phrase (0/2) 2  Language : Fluency (0/1) 1  Abstraction (0/2) 2  Delayed Recall (0/5) 0  Orientation (0/6) 5  Total 21  Adjusted Score (based on education) 21       08/05/2022    6:00 PM 12/11/2021    3:09 PM  MMSE - Mini Mental State Exam  Orientation to time 2 0  Orientation to Place 5 5  Registration 3 3  Attention/ Calculation 4 2  Recall 1 0  Language- name 2 objects 2 2  Language- repeat 1 1  Language- follow 3 step command 3 3  Language- read & follow direction 1 1  Write a sentence 1 1  Copy design 1 0  Total score 24 18       Movement examination: Tone: There is normal tone in the UE/LE Abnormal movements:  no tremor.  No myoclonus.  No asterixis.   Coordination:  There is no decremation with RAM's. Normal finger to nose  Gait and Station: The patient has some difficulty arising out of a deep-seated chair without the use of the hands. The patient's stride length is slightly difficult due to right foot drop, unable to heel toe, able to ambulate in tandem fashion  Gait is cautious and narrow.   Thank you for allowing [place] the opportunity to participate in the care of this nice patient. Please do not hesitate to contact us for any questions or concerns.   Total time spent on today's visit was *** minutes dedicated to this patient today, preparing to see patient, examining the patient, ordering tests and/or medications and counseling the patient, documenting clinical information in the EHR or other health record, independently interpreting results and communicating results to the patient/family, discussing treatment and goals, answering patient's questions and coordinating care.  Cc:  Philip Aspen, Limmie Patricia, MD  Marlowe Kays 02/03/2023 3:15 PM

## 2023-02-06 ENCOUNTER — Other Ambulatory Visit: Payer: Self-pay | Admitting: Internal Medicine

## 2023-02-06 DIAGNOSIS — M21371 Foot drop, right foot: Secondary | ICD-10-CM

## 2023-02-06 DIAGNOSIS — F411 Generalized anxiety disorder: Secondary | ICD-10-CM

## 2023-02-08 ENCOUNTER — Telehealth: Payer: Self-pay | Admitting: Internal Medicine

## 2023-02-08 NOTE — Telephone Encounter (Signed)
Called pharm and patient has one refill left that ex at the end of the month

## 2023-02-08 NOTE — Telephone Encounter (Signed)
Prescription Request  02/08/2023  LOV: 12/22/2022  HYDROcodone-acetaminophen (NORCO) 5-325 MG tablet  What is the name of the medication or equipment?   Have you contacted your pharmacy to request a refill? No   Which pharmacy would you like this sent to?  [id number] [place], [place] - [address] AT [facility] [address] [facility] [address] [facility] [address] [place] [address] Phone: [phone] Fax: [phone]    Patient notified that their request is being sent to the clinical staff for review and that they should receive a response within 2 business days.   Please advise at [facility] [phone]

## 2023-02-09 NOTE — Telephone Encounter (Signed)
Spoke to the pharmacist and the earliest that the prescription can be filled is 02/16/23. Left detailed message on machine for patient with the above information.

## 2023-03-03 ENCOUNTER — Other Ambulatory Visit: Payer: Self-pay | Admitting: Internal Medicine

## 2023-03-03 DIAGNOSIS — F411 Generalized anxiety disorder: Secondary | ICD-10-CM

## 2023-03-03 DIAGNOSIS — E039 Hypothyroidism, unspecified: Secondary | ICD-10-CM

## 2023-03-03 DIAGNOSIS — M21371 Foot drop, right foot: Secondary | ICD-10-CM

## 2023-03-15 ENCOUNTER — Other Ambulatory Visit: Payer: Self-pay | Admitting: Internal Medicine

## 2023-03-15 DIAGNOSIS — C8580 Other specified types of non-Hodgkin lymphoma, unspecified site: Secondary | ICD-10-CM

## 2023-03-15 MED ORDER — HYDROCODONE-ACETAMINOPHEN 5-325 MG PO TABS
1.0000 | ORAL_TABLET | Freq: Two times a day (BID) | ORAL | 0 refills | Status: DC | PRN
Start: 2023-03-15 — End: 2023-07-13

## 2023-03-15 MED ORDER — HYDROCODONE-ACETAMINOPHEN 5-325 MG PO TABS
1.0000 | ORAL_TABLET | Freq: Two times a day (BID) | ORAL | 0 refills | Status: DC | PRN
Start: 2023-03-15 — End: 2023-04-15

## 2023-03-15 NOTE — Telephone Encounter (Signed)
Refill sent.

## 2023-03-15 NOTE — Telephone Encounter (Signed)
Prescription Request  03/15/2023  LOV: 12/22/2022  What is the name of the medication or equipment? HYDROcodone-acetaminophen (NORCO) 5-325 MG tablet   Have you contacted your pharmacy to request a refill? No   Which pharmacy would you like this sent to?  [facility] #[id number] G[place] Otto, [place] - [address] AT [facility] OF [facility] [address] & [facility] [address] [place] [address] Phone: [phone] Fax: [phone]    Patient notified that their request is being sent to the clinical staff for review and that they should receive a response within 2 business days.   Please advise at [facility] [phone]

## 2023-03-15 NOTE — Telephone Encounter (Signed)
Last office visit was 12/22/22. Okay to refill?

## 2023-04-05 ENCOUNTER — Other Ambulatory Visit: Payer: Self-pay | Admitting: Internal Medicine

## 2023-04-05 DIAGNOSIS — F411 Generalized anxiety disorder: Secondary | ICD-10-CM

## 2023-04-05 DIAGNOSIS — M21371 Foot drop, right foot: Secondary | ICD-10-CM

## 2023-04-13 ENCOUNTER — Other Ambulatory Visit: Payer: Self-pay | Admitting: Internal Medicine

## 2023-04-13 DIAGNOSIS — C8580 Other specified types of non-Hodgkin lymphoma, unspecified site: Secondary | ICD-10-CM

## 2023-04-13 NOTE — Telephone Encounter (Signed)
Prescription Request  04/13/2023  LOV: 12/22/2022  What is the name of the medication or equipment? HYDROcodone-acetaminophen HYDROcodone-acetaminophen (NORCO) 5-325 MG tablet  Have you contacted your pharmacy to request a refill? No   Which pharmacy would you like this sent to?  [facility] #[id number] Ginette Otto, [place] - [address] AT [facility] OF [facility] [address] & [facility] [address] [place] [address] Phone: [phone] Fax: [phone]    Patient notified that their request is being sent to the clinical staff for review and that they should receive a response within 2 business days.   Please advise at Mobile [phone] (mobile)

## 2023-04-15 MED ORDER — HYDROCODONE-ACETAMINOPHEN 5-325 MG PO TABS
1.0000 | ORAL_TABLET | Freq: Two times a day (BID) | ORAL | 0 refills | Status: DC | PRN
Start: 2023-04-15 — End: 2023-07-13

## 2023-04-30 DIAGNOSIS — R3914 Feeling of incomplete bladder emptying: Secondary | ICD-10-CM | POA: Diagnosis not present

## 2023-05-05 ENCOUNTER — Ambulatory Visit: Payer: Medicare Other

## 2023-05-05 VITALS — BP 120/62 | HR 95 | Temp 97.6°F | Ht 71.5 in | Wt 153.6 lb

## 2023-05-05 DIAGNOSIS — Z0000 Encounter for general adult medical examination without abnormal findings: Secondary | ICD-10-CM | POA: Diagnosis not present

## 2023-05-05 NOTE — Progress Notes (Signed)
Subjective:   Perry Erk. is a 82 y.o. male who presents for Medicare Annual/Subsequent preventive examination.  Visit Complete: In person   Cardiac Risk Factors include: advanced age (>27men, >54 women);male gender     Objective:    Today's Vitals   05/05/23 1543  BP: 120/62  Pulse: 95  Temp: 97.6 F (36.4 C)  TempSrc: Oral  SpO2: 96%  Weight: 153 lb 9.6 oz (69.7 kg)  Height: 5' 11.5" (1.816 m)   Body mass index is 21.12 kg/m.     05/05/2023    3:56 PM 02/03/2023    3:02 PM 10/09/2022    6:00 PM 08/05/2022    3:28 PM 02/02/2022    2:30 PM 12/31/2021    1:43 PM 10/29/2021    2:39 PM  Advanced Directives  Does Patient Have a Medical Advance Directive? Yes Yes Yes Yes Yes Yes Yes  Type of Estate agent of [place];Living will Healthcare Power of [place] Living will    Healthcare Power of [place];Living will  Does patient want to make changes to medical advance directive?  No - Patient declined Yes (Inpatient - patient defers changing a medical advance directive at this time - Information given)    No - Patient declined  Copy of Healthcare Power of Attorney in Chart? No - copy requested No - copy requested     No - copy requested    Current Medications (verified) Outpatient Encounter Medications as of 05/05/2023  Medication Sig   amitriptyline (ELAVIL) 25 MG tablet TAKE 1 TABLET(25 MG) BY MOUTH AT BEDTIME   aspirin EC 81 MG tablet Take 81 mg by mouth in the morning. Swallow whole.   donepezil (ARICEPT) 10 MG tablet Take one tablet at 10 mg daily (Patient taking differently: Take 10 mg by mouth in the morning. Take one tablet at 10 mg daily)   HYDROcodone-acetaminophen (NORCO) 5-325 MG tablet Take 1 tablet by mouth 2 (two) times daily as needed for moderate pain.   HYDROcodone-acetaminophen (NORCO) 5-325 MG tablet Take 1 tablet by mouth 2 (two) times daily as needed for moderate pain.   HYDROcodone-acetaminophen (NORCO) 5-325 MG tablet Take 1  tablet by mouth 2 (two) times daily as needed for moderate pain.   ibuprofen (ADVIL) 200 MG tablet Take 1,000-1,200 mg by mouth daily as needed (for pain).   levothyroxine (SYNTHROID) 25 MCG tablet TAKE 1 TABLET(25 MCG) BY MOUTH DAILY BEFORE BREAKFAST   LORazepam (ATIVAN) 0.5 MG tablet TAKE 1 TABLET(0.5 MG) BY MOUTH TWICE DAILY AS NEEDED FOR ANXIETY   metoprolol tartrate (LOPRESSOR) 25 MG tablet Take 0.5 tablets (12.5 mg total) by mouth 2 (two) times daily. Hold dose if blood pressure is less than 90/60 or if heart rate is less than 60   NON FORMULARY Apply 1 application  topically See admin instructions. Nervive Pain Relieving Cream- Apply to the right foot every 8 hours as needed for pain   Polyethyl Glycol-Propyl Glycol (SYSTANE OP) Place 1 drop into both eyes daily as needed (for dryness).   Polyethylene Glycol 3350 (MIRALAX PO) Take 17 g by mouth daily in the afternoon.   silodosin (RAPAFLO) 8 MG CAPS capsule Take 8 mg by mouth daily.   No facility-administered encounter medications on file as of 05/05/2023.    Allergies (verified) Antihistamines, diphenhydramine-type   History: Past Medical History:  Diagnosis Date   ALLERGIC RHINITIS 08/03/2007   BACK PAIN 09/18/2009   Basal cell carcinoma 08/21/2004   right wing nose (MOHS)  BCC (basal cell carcinoma of skin) 06/07/2001   mid upper back   BCC (basal cell carcinoma) 03/08/2013   right forehead (curet & carter)   BPH associated with nocturia 02/18/2007   Qualifier: Diagnosis of  By: Briscoe Burns CMA, Alvy Beal     CAROTID BRUIT, LEFT 08/07/2008   ERECTILE DYSFUNCTION, MILD 08/07/2008   Foot drop    rt foot   GERD 02/18/2007   HAMMER TOE 08/03/2007   Hepatitis 1977   no residual problems, type A   KNEE PAIN, LEFT, CHRONIC 08/07/2008   Lymphoma (HCC)    right hip   Rash, skin    torso   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   right nose (caulery, 43fu)   SCC (squamous cell carcinoma) 11/24/2016   left upper arm (curetx3, 74fu)   SCC  (squamous cell carcinoma) 04/20/2017   mid upper forehead  (tx after bx)   SCC (squamous cell carcinoma) 05/11/2019   left scapha   SCC (squamous cell carcinoma) 08/17/2019   mid back   Skin cancer    Sleeping difficulty    Squamous cell carcinoma of skin 07/31/2003   right brow (curetx3, 84fu)   Past Surgical History:  Procedure Laterality Date   CATARACT EXTRACTION W/ INTRAOCULAR LENS  IMPLANT, BILATERAL Bilateral    KNEE ARTHROSCOPY     left   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   Procedure: LAPAROSCOPIC SPLENECTOMY;  Surgeon: Glenna Fellows, MD;  Location: [facility];  Service: General;  Laterality: N/A;   SHOULDER OPEN ROTATOR CUFF REPAIR  07/06/05   right   TONSILLECTOMY AND ADENOIDECTOMY     TRANSURETHRAL RESECTION OF PROSTATE     Family History  Problem Relation Age of Onset   Pulmonary fibrosis Mother    Uterine cancer Mother    Prostate cancer Father    Heart disease Brother    Heart attack Other    Aneurysm Other        aortic   Coronary artery disease Sister    Social History   Socioeconomic History   Marital status: Married    Spouse name: Not on file   Number of children: 2   Years of education: 16   Highest education level: Not on file  Occupational History   Occupation: retired    Comment: Runner, broadcasting/film/video, coaching  Tobacco Use   Smoking status: Never   Smokeless tobacco: Never  Vaping Use   Vaping status: Never Used  Substance and Sexual Activity   Alcohol use: Yes    Alcohol/week: 0.0 standard drinks of alcohol    Comment: rarely    Drug use: No   Sexual activity: Not on file  Other Topics Concern   Not on file  Social History Narrative   Married.  Very active.     Right handed   Drinks caffeine    Previous Community education officer,   Social Determinants of Health   Financial Resource Strain: Low Risk  (05/05/2023)   Overall Financial Resource Strain (CARDIA)    Difficulty of Paying Living Expenses: Not hard at all  Food Insecurity: No Food  Insecurity (05/05/2023)   Hunger Vital Sign    Worried About Running Out of Food in the Last Year: Never true    Ran Out of Food in the Last Year: Never true  Transportation Needs: No Transportation Needs (05/05/2023)   PRAPARE - Administrator, Civil Service (Medical): No    Lack of Transportation (Non-Medical): No  Physical Activity: Inactive (05/05/2023)  Exercise Vital Sign    Days of Exercise per Week: 0 days    Minutes of Exercise per Session: 0 min  Stress: No Stress Concern Present (05/05/2023)   [employer] of Occupational Health - Occupational Stress Questionnaire    Feeling of Stress : Not at all  Social Connections: Moderately Integrated (05/05/2023)   Social Connection and Isolation Panel [NHANES]    Frequency of Communication with Friends and Family: More than three times a week    Frequency of Social Gatherings with Friends and Family: More than three times a week    Attends Religious Services: Never    Database administrator or Organizations: Yes    Attends Banker Meetings: Never    Marital Status: Married    Tobacco Counseling Counseling given: Not Answered   Clinical Intake:  Pre-visit preparation completed: Yes  Pain : No/denies pain     BMI - recorded: 21.12 Nutritional Status: BMI of 19-24  Normal Nutritional Risks: None Diabetes: No  How often do you need to have someone help you when you read instructions, pamphlets, or other written materials from your doctor or pharmacy?: 1 - Never  Interpreter Needed?: No  Information entered by :: Theresa Mulligan LPN   Activities of Daily Living    05/05/2023    3:52 PM 10/09/2022    6:00 PM  In your present state of health, do you have any difficulty performing the following activities:  Hearing? 0 0  Vision? 0 0  Difficulty concentrating or making decisions? 1 1  Comment Dx: Dementia   Walking or climbing stairs? 1 1  Comment Uses a Walker   Dressing or bathing? 0 1   Doing errands, shopping? 1 1  Comment Wife Advice worker and eating ? N   Using the Toilet? N   In the past six months, have you accidently leaked urine? N   Do you have problems with loss of bowel control? N   Managing your Medications? N   Managing your Finances? N   Housekeeping or managing your Housekeeping? N     Patient Care Team: Philip Aspen, Limmie Patricia, MD as PCP - General (Internal Medicine) Janalyn Harder, MD (Inactive) as Consulting Physician (Dermatology) Glyn Ade, PA-C as Physician Assistant (Dermatology)  Indicate any recent Medical Services you may have received from other than [facility] providers in the past year (date may be approximate).     Assessment:   This is a routine wellness examination for Jitsuo.  Hearing/Vision screen Hearing Screening - Comments:: Denies hearing difficulties   Vision Screening - Comments:: Wears rx glasses - up to date with routine eye exams with  [facility]   Goals Addressed               This Visit's Progress     Maintain good health (pt-stated)         Depression Screen    05/05/2023    3:51 PM 12/22/2022    4:22 PM 10/21/2022    3:03 PM 06/11/2022    4:30 PM 03/02/2022    3:34 PM 12/11/2021    3:12 PM 11/17/2021    3:18 PM  PHQ 2/9 Scores  PHQ - 2 Score 0 4 4 0 0  0  PHQ- 9 Score  7 7 4  0  3  Exception Documentation      Patient refusal     Fall Risk    05/05/2023    3:54 PM  02/03/2023    3:02 PM 12/22/2022    4:22 PM 10/21/2022    3:03 PM 08/05/2022    3:28 PM  Fall Risk   Falls in the past year? 1 0 0 1 0  Number falls in past yr: 0 0 0 1 0  Injury with Fall? 0 0 0 1 0  Risk for fall due to : No Fall Risks      Follow up Falls prevention discussed Falls evaluation completed Falls evaluation completed Falls evaluation completed Falls evaluation completed    MEDICARE RISK AT HOME: Medicare Risk at Home Any stairs in or around the home?: Yes If so, are there any without handrails?:  No Home free of loose throw rugs in walkways, pet beds, electrical cords, etc?: Yes Adequate lighting in your home to reduce risk of falls?: Yes Life alert?: No Use of a cane, walker or w/c?: Yes Grab bars in the bathroom?: Yes Shower chair or bench in shower?: Yes Elevated toilet seat or a handicapped toilet?: No  TIMED UP AND GO:  Was the test performed?  Yes  Length of time to ambulate 10 feet: 10 sec Gait slow and steady with assistive device    Cognitive Function:    02/04/2023    7:00 AM 08/05/2022    6:00 PM 12/11/2021    3:09 PM  MMSE - Mini Mental State Exam  Orientation to time 0 2 0  Orientation to Place 4 5 5   Registration 3 3 3   Attention/ Calculation 4 4 2   Recall 0 1 0  Language- name 2 objects 2 2 2   Language- repeat 1 1 1   Language- follow 3 step command 3 3 3   Language- read & follow direction 1 1 1   Write a sentence 0 1 1  Copy design 1 1 0  Total score 19 24 18       10/14/2015    1:06 PM  Montreal Cognitive Assessment   Visuospatial/ Executive (0/5) 2  Naming (0/3) 3  Attention: Read list of digits (0/2) 2  Attention: Read list of letters (0/1) 1  Attention: Serial 7 subtraction starting at 100 (0/3) 3  Language: Repeat phrase (0/2) 2  Language : Fluency (0/1) 1  Abstraction (0/2) 2  Delayed Recall (0/5) 0  Orientation (0/6) 5  Total 21  Adjusted Score (based on education) 21      05/05/2023    3:57 PM 10/29/2021    2:40 PM 10/23/2020    2:53 PM  6CIT Screen  What Year? 4 points 0 points 4 points  What month? 0 points 0 points 0 points  What time? 0 points 0 points   Count back from 20 0 points 0 points 0 points  Months in reverse 0 points 0 points 0 points  Repeat phrase 4 points 2 points 10 points  Total Score 8 points 2 points     Immunizations Immunization History  Administered Date(s) Administered   H1N1 08/07/2008   HIB (PRP-OMP) 03/01/2015   Influenza Whole 07/06/2005, 04/06/2007, 04/05/2009   Influenza, High Dose Seasonal PF  04/01/2016, 03/23/2017, 03/15/2019, 03/26/2020, 03/10/2021, 03/06/2022   Influenza,inj,Quad PF,6+ Mos 04/03/2014, 08/16/2015, 03/30/2018   Influenza-Unspecified 04/05/2013   PFIZER Comirnaty(Gray Top)Covid-19 Tri-Sucrose Vaccine 11/27/2020   PFIZER(Purple Top)SARS-COV-2 Vaccination 08/12/2019, 09/06/2019, 03/06/2020   Pfizer Covid-19 Vaccine Bivalent Booster 46yrs & up 03/10/2021   Pneumococcal Conjugate-13 10/17/2013   Pneumococcal Polysaccharide-23 04/05/2006, 04/05/2008, 05/26/2016   Td 07/07/2003   Tdap 03/23/2017   Tetanus 10/17/2013  Zoster Recombinant(Shingrix) 05/10/2018, 07/12/2018   Zoster, Live 08/22/2010    TDAP status: Up to date  Flu Vaccine status: Due, Education has been provided regarding the importance of this vaccine. Advised may receive this vaccine at local pharmacy or Health Dept. Aware to provide a copy of the vaccination record if obtained from local pharmacy or Health Dept. Verbalized acceptance and understanding.  Pneumococcal vaccine status: Up to date  Covid-19 vaccine status: Declined, Education has been provided regarding the importance of this vaccine but patient still declined. Advised may receive this vaccine at local pharmacy or Health Dept.or vaccine clinic. Aware to provide a copy of the vaccination record if obtained from local pharmacy or Health Dept. Verbalized acceptance and understanding.  Qualifies for Shingles Vaccine? Yes   Zostavax completed Yes   Shingrix Completed?: Yes  Screening Tests Health Maintenance  Topic Date Due   INFLUENZA VACCINE  02/04/2023   COVID-19 Vaccine (6 - 2023-24 season) 03/07/2023   Medicare Annual Wellness (AWV)  05/04/2024   DTaP/Tdap/Td (4 - Td or Tdap) 03/24/2027   Pneumonia Vaccine 24+ Years old  Completed   Zoster Vaccines- Shingrix  Completed   HPV VACCINES  Aged Out    Health Maintenance  Health Maintenance Due  Topic Date Due   INFLUENZA VACCINE  02/04/2023   COVID-19 Vaccine (6 - 2023-24  season) 03/07/2023     Additional Screening:    Vision Screening: Recommended annual ophthalmology exams for early detection of glaucoma and other disorders of the eye. Is the patient up to date with their annual eye exam?  Yes  Who is the provider or what is the name of the office in which the patient attends annual eye exams? [facility] If pt is not established with a provider, would they like to be referred to a provider to establish care? No .   Dental Screening: Recommended annual dental exams for proper oral hygiene    Community Resource Referral / Chronic Care Management:  CRR required this visit?  No   CCM required this visit?  No     Plan:     I have personally reviewed and noted the following in the patient's chart:   Medical and social history Use of alcohol, tobacco or illicit drugs  Current medications and supplements including opioid prescriptions. Patient is currently taking opioid prescriptions. Information provided to patient regarding non-opioid alternatives. Patient advised to discuss non-opioid treatment plan with their provider. Functional ability and status Nutritional status Physical activity Advanced directives List of other physicians Hospitalizations, surgeries, and ER visits in previous 12 months Vitals Screenings to include cognitive, depression, and falls Referrals and appointments  In addition, I have reviewed and discussed with patient certain preventive protocols, quality metrics, and best practice recommendations. A written personalized care plan for preventive services as well as general preventive health recommendations were provided to patient.     Tillie Rung, LPN   16/04/9603   After Visit Summary: Given  Nurse Notes: None

## 2023-05-05 NOTE — Patient Instructions (Addendum)
Perry Nielsen , Thank you for taking time to come for your Medicare Wellness Visit. I appreciate your ongoing commitment to your health goals. Please review the following plan we discussed and let me know if I can assist you in the future.   Referrals/Orders/Follow-Ups/Clinician Recommendations:   This is a list of the screening recommended for you and due dates:  Health Maintenance  Topic Date Due   Flu Shot  02/04/2023   COVID-19 Vaccine (6 - 2023-24 season) 03/07/2023   Medicare Annual Wellness Visit  05/04/2024   DTaP/Tdap/Td vaccine (4 - Td or Tdap) 03/24/2027   Pneumonia Vaccine  Completed   Zoster (Shingles) Vaccine  Completed   HPV Vaccine  Aged Out   Opioid Pain Medicine Management Opioids are powerful medicines that are used to treat moderate to severe pain. When used for short periods of time, they can help you to: Sleep better. Do better in physical or occupational therapy. Feel better in the first few days after an injury. Recover from surgery. Opioids should be taken with the supervision of a trained health care provider. They should be taken for the shortest period of time possible. This is because opioids can be addictive, and the longer you take opioids, the greater your risk of addiction. This addiction can also be called opioid use disorder. What are the risks? Using opioid pain medicines for longer than 3 days increases your risk of side effects. Side effects include: Constipation. Nausea and vomiting. Breathing difficulties (respiratory depression). Drowsiness. Confusion. Opioid use disorder. Itching. Taking opioid pain medicine for a long period of time can affect your ability to do daily tasks. It also puts you at risk for: Motor vehicle crashes. Depression. Suicide. Heart attack. Overdose, which can be life-threatening. What is a pain treatment plan? A pain treatment plan is an agreement between you and your health care provider. Pain is unique to each  person, and treatments vary depending on your condition. To manage your pain, you and your health care provider need to work together. To help you do this: Discuss the goals of your treatment, including how much pain you might expect to have and how you will manage the pain. Review the risks and benefits of taking opioid medicines. Remember that a good treatment plan uses more than one approach and minimizes the chance of side effects. Be honest about the amount of medicines you take and about any drug or alcohol use. Get pain medicine prescriptions from only one health care provider. Pain can be managed with many types of alternative treatments. Ask your health care provider to refer you to one or more specialists who can help you manage pain through: Physical or occupational therapy. Counseling (cognitive behavioral therapy). Good nutrition. Biofeedback. Massage. Meditation. Non-opioid medicine. Following a gentle exercise program. How to use opioid pain medicine Taking medicine Take your pain medicine exactly as told by your health care provider. Take it only when you need it. If your pain gets less severe, you may take less than your prescribed dose if your health care provider approves. If you are not having pain, do nottake pain medicine unless your health care provider tells you to take it. If your pain is severe, do nottry to treat it yourself by taking more pills than instructed on your prescription. Contact your health care provider for help. Write down the times when you take your pain medicine. It is easy to become confused while on pain medicine. Writing the time can help you avoid overdose.  Take other over-the-counter or prescription medicines only as told by your health care provider. Keeping yourself and others safe  While you are taking opioid pain medicine: Do not drive, use machinery, or power tools. Do not sign legal documents. Do not drink alcohol. Do not take  sleeping pills. Do not supervise children by yourself. Do not do activities that require climbing or being in high places. Do not go to a lake, river, ocean, spa, or swimming pool. Do not share your pain medicine with anyone. Keep pain medicine in a locked cabinet or in a secure area where pets and children cannot reach it. Stopping your use of opioids If you have been taking opioid medicine for more than a few weeks, you may need to slowly decrease (taper) how much you take until you stop completely. Tapering your use of opioids can decrease your risk of symptoms of withdrawal, such as: Pain and cramping in the abdomen. Nausea. Sweating. Sleepiness. Restlessness. Uncontrollable shaking (tremors). Cravings for the medicine. Do not attempt to taper your use of opioids on your own. Talk with your health care provider about how to do this. Your health care provider may prescribe a step-down schedule based on how much medicine you are taking and how long you have been taking it. Getting rid of leftover pills Do not save any leftover pills. Get rid of leftover pills safely by: Taking the medicine to a prescription take-back program. This is usually offered by the county or law enforcement. Bringing them to a pharmacy that has a drug disposal container. Flushing them down the toilet. Check the label or package insert of your medicine to see whether this is safe to do. Throwing them out in the trash. Check the label or package insert of your medicine to see whether this is safe to do. If it is safe to throw it out, remove the medicine from the original container, put it into a sealable bag or container, and mix it with used coffee grounds, food scraps, dirt, or cat litter before putting it in the trash. Follow these instructions at home: Activity Do exercises as told by your health care provider. Avoid activities that make your pain worse. Return to your normal activities as told by your health  care provider. Ask your health care provider what activities are safe for you. General instructions You may need to take these actions to prevent or treat constipation: Drink enough fluid to keep your urine pale yellow. Take over-the-counter or prescription medicines. Eat foods that are high in fiber, such as beans, whole grains, and fresh fruits and vegetables. Limit foods that are high in fat and processed sugars, such as fried or sweet foods. Keep all follow-up visits. This is important. Where to find support If you have been taking opioids for a long time, you may benefit from receiving support for quitting from a local support group or counselor. Ask your health care provider for a referral to these resources in your area. Where to find more information Centers for Disease Control and Prevention (CDC): FootballExhibition.com.br [place] Food and Drug Administration (FDA): PumpkinSearch.com.ee Get help right away if: You may have taken too much of an opioid (overdosed). Common symptoms of an overdose: Your breathing is slower or more shallow than normal. You have a very slow heartbeat (pulse). You have slurred speech. You have nausea and vomiting. Your pupils become very small. You have other potential symptoms: You are very confused. You faint or feel like you will faint. You have  cold, clammy skin. You have blue lips or fingernails. You have thoughts of harming yourself or harming others. These symptoms may represent a serious problem that is an emergency. Do not wait to see if the symptoms will go away. Get medical help right away. Call your local emergency services (911 in the [place] Do not drive yourself to the hospital.  If you ever feel like you may hurt yourself or others, or have thoughts about taking your own life, get help right away. Go to your nearest emergency department or: Call your local emergency services (911 in the [place] Call the [facility] ((573)605-6287 in the  [place] Call a suicide crisis helpline, such as the National Suicide Prevention Lifeline at 808 529 0145 or 988 in the [place] This is open 24 hours a day in the [place] Text the Crisis Text Line at (825)205-6889 (in the [place] Summary Opioid medicines can help you manage moderate to severe pain for a short period of time. A pain treatment plan is an agreement between you and your health care provider. Discuss the goals of your treatment, including how much pain you might expect to have and how you will manage the pain. If you think that you or someone else may have taken too much of an opioid, get medical help right away. This information is not intended to replace advice given to you by your health care provider. Make sure you discuss any questions you have with your health care provider. Document Revised: 01/15/2021 Document Reviewed: 10/02/2020 Elsevier Patient Education  2024 Elsevier Inc.  Advanced directives: (Copy Requested) Please bring a copy of your health care power of attorney and living will to the office to be added to your chart at your convenience.  Next Medicare Annual Wellness Visit scheduled for next year: Yes

## 2023-05-06 ENCOUNTER — Other Ambulatory Visit: Payer: Self-pay | Admitting: Adult Health

## 2023-05-06 ENCOUNTER — Ambulatory Visit: Payer: Self-pay | Admitting: Physician Assistant

## 2023-05-06 DIAGNOSIS — M21371 Foot drop, right foot: Secondary | ICD-10-CM

## 2023-05-06 DIAGNOSIS — F411 Generalized anxiety disorder: Secondary | ICD-10-CM

## 2023-05-11 ENCOUNTER — Telehealth: Payer: Self-pay | Admitting: Internal Medicine

## 2023-05-11 ENCOUNTER — Other Ambulatory Visit: Payer: Self-pay | Admitting: Internal Medicine

## 2023-05-11 DIAGNOSIS — R531 Weakness: Secondary | ICD-10-CM

## 2023-05-11 DIAGNOSIS — E538 Deficiency of other specified B group vitamins: Secondary | ICD-10-CM

## 2023-05-11 DIAGNOSIS — Z0000 Encounter for general adult medical examination without abnormal findings: Secondary | ICD-10-CM

## 2023-05-11 DIAGNOSIS — E039 Hypothyroidism, unspecified: Secondary | ICD-10-CM

## 2023-05-11 DIAGNOSIS — F411 Generalized anxiety disorder: Secondary | ICD-10-CM

## 2023-05-11 DIAGNOSIS — M21371 Foot drop, right foot: Secondary | ICD-10-CM

## 2023-05-11 DIAGNOSIS — N401 Enlarged prostate with lower urinary tract symptoms: Secondary | ICD-10-CM

## 2023-05-11 NOTE — Telephone Encounter (Signed)
Labs ordered.

## 2023-05-11 NOTE — Telephone Encounter (Signed)
Pt call and stated the pharmacy will not fill RX because it have [facility] name on it and need dr Ardyth Harps name on it

## 2023-05-11 NOTE — Telephone Encounter (Addendum)
[name] last CPE was 09/01/2021. Pt did not have a CPE for 2024. Pt just had a Medicare Well Visit on 05/05/23. [name] spouse called to ask if Pt can have a CPE for November/December. Spouse is asking if the AWV (already had this year) affect the CPE? Also, [facility] only has afternoon CPEs for the rest of 2024, and does not have any morning CPEs available until Feb. 2025. Spouse is asking if Pt can have his labs done early in the morning sometime in November or December, and come back in the afternoon on the same day for a CPE? Please advise.

## 2023-05-12 NOTE — Telephone Encounter (Signed)
Called Pt to schedule CPE.  Spouse said Pt was still sleeping, and would have him call back when he was ready to schedule.

## 2023-06-07 ENCOUNTER — Other Ambulatory Visit: Payer: Self-pay | Admitting: Internal Medicine

## 2023-06-07 DIAGNOSIS — M21371 Foot drop, right foot: Secondary | ICD-10-CM

## 2023-06-07 DIAGNOSIS — F411 Generalized anxiety disorder: Secondary | ICD-10-CM

## 2023-06-07 NOTE — Telephone Encounter (Signed)
Prescription Request  06/07/2023  LOV: 12/22/2022  What is the name of the medication or equipment? HYDROcodone-acetaminophen (NORCO) 5-325 MG tablet   Have you contacted your pharmacy to request a refill? No   Which pharmacy would you like this sent to?  [id number] [place], [place] - [address] AT [facility] [address] [facility] [address] [facility] [address] [place] [address] Phone: [phone] Fax: [phone]    Patient notified that their request is being sent to the clinical staff for review and that they should receive a response within 2 business days.   Please advise at Mobile [phone] (mobile)

## 2023-06-08 ENCOUNTER — Other Ambulatory Visit: Payer: Self-pay | Admitting: Internal Medicine

## 2023-06-09 ENCOUNTER — Ambulatory Visit: Payer: Medicare Other | Admitting: Dermatology

## 2023-06-12 ENCOUNTER — Other Ambulatory Visit: Payer: Self-pay | Admitting: Internal Medicine

## 2023-06-12 DIAGNOSIS — E039 Hypothyroidism, unspecified: Secondary | ICD-10-CM

## 2023-07-05 ENCOUNTER — Other Ambulatory Visit: Payer: Self-pay | Admitting: Internal Medicine

## 2023-07-05 DIAGNOSIS — M21371 Foot drop, right foot: Secondary | ICD-10-CM

## 2023-07-05 DIAGNOSIS — F411 Generalized anxiety disorder: Secondary | ICD-10-CM

## 2023-07-08 ENCOUNTER — Other Ambulatory Visit: Payer: Self-pay

## 2023-07-08 DIAGNOSIS — C8580 Other specified types of non-Hodgkin lymphoma, unspecified site: Secondary | ICD-10-CM

## 2023-07-08 NOTE — Telephone Encounter (Signed)
 Copied from CRM [id number]. Topic: Clinical - Medication Refill >> Jul 08, 2023  1:09 PM Gerardine PARAS wrote: Most Recent Primary Care Visit:  Provider: TANDA ROJELIO ORN  Department: [facility]  Visit Type: MEDICARE AWV, SEQUENTIAL  Date: 05/05/2023  Medication: HYDROcodone -acetaminophen  (NORCO) 5-325 MG tablet  Has the patient contacted their pharmacy? Yes, request a new prescription  (Agent: If no, request that the patient contact the pharmacy for the refill. If patient does not wish to contact the pharmacy document the reason why and proceed with request.) (Agent: If yes, when and what did the pharmacy advise?)  Is this the correct pharmacy for this prescription? Yes If no, delete pharmacy and type the correct one.  This is the patient's preferred pharmacy:  [facility] #[id number] [place], [place] - [address] AT [facility] [address] [facility] [address] & [facility] [address] [place] [address] Phone: [phone] Fax: [phone]   Has the prescription been filled recently? Yes  Is the patient out of the medication? No  Has the patient been seen for an appointment in the last year OR does the patient have an upcoming appointment? Yes  Can we respond through MyChart? No  Agent: Please be advised that Rx refills may take up to 3 business days. We ask that you follow-up with your pharmacy.

## 2023-07-13 NOTE — Telephone Encounter (Signed)
 Okay to pend

## 2023-07-13 NOTE — Addendum Note (Signed)
 Addended by: Kern Reap B on: 07/13/2023 04:04 PM   Modules accepted: Orders

## 2023-07-14 MED ORDER — HYDROCODONE-ACETAMINOPHEN 5-325 MG PO TABS
1.0000 | ORAL_TABLET | Freq: Two times a day (BID) | ORAL | 0 refills | Status: DC | PRN
Start: 2023-07-14 — End: 2023-10-04

## 2023-07-14 NOTE — Telephone Encounter (Signed)
 Refill was sent

## 2023-07-19 ENCOUNTER — Ambulatory Visit (INDEPENDENT_AMBULATORY_CARE_PROVIDER_SITE_OTHER): Payer: Medicare Other | Admitting: Internal Medicine

## 2023-07-19 ENCOUNTER — Encounter: Payer: Self-pay | Admitting: Internal Medicine

## 2023-07-19 VITALS — BP 120/84 | HR 101 | Temp 97.5°F | Wt 156.7 lb

## 2023-07-19 DIAGNOSIS — F4321 Adjustment disorder with depressed mood: Secondary | ICD-10-CM | POA: Diagnosis not present

## 2023-07-19 DIAGNOSIS — F338 Other recurrent depressive disorders: Secondary | ICD-10-CM | POA: Diagnosis not present

## 2023-07-19 NOTE — Progress Notes (Signed)
 Established Patient Office Visit     CC/Reason for Visit: Depression  HPI: Perry Nielsen. is a 83 y.o. male who is coming in today for the above mentioned reasons.  Patient is present with his wife today.  It is important to note that he has a component of dementia and is followed by neurology and is on donepezil .  Patient admits to being depressed.  This seems to stem from his foot drop and inability to be highly physically active.  Wife states strong component of seasonal depression has always been present during the winter months, as he is unable to sit on the front porch and leave the house as he is used to during warmer periods of time.  No suicidal ideation, appears well-groomed.   Past Medical/Surgical History: Past Medical History:  Diagnosis Date   ALLERGIC RHINITIS 08/03/2007   BACK PAIN 09/18/2009   Basal cell carcinoma 08/21/2004   right wing nose ([facility])   BCC (basal cell carcinoma of skin) 06/07/2001   mid upper back   BCC (basal cell carcinoma) 03/08/2013   right forehead (curet & carter)   BPH associated with nocturia 02/18/2007   Qualifier: Diagnosis of  By: Sherron CMA, Steen     CAROTID BRUIT, LEFT 08/07/2008   ERECTILE DYSFUNCTION, MILD 08/07/2008   Foot drop    rt foot   GERD 02/18/2007   HAMMER TOE 08/03/2007   Hepatitis 1977   no residual problems, type A   KNEE PAIN, LEFT, CHRONIC 08/07/2008   Lymphoma (HCC)    right hip   Rash, skin    torso   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   right nose (caulery, 71fu)   SCC (squamous cell carcinoma) 11/24/2016   left upper arm (curetx3, 37fu)   SCC (squamous cell carcinoma) 04/20/2017   mid upper forehead  (tx after bx)   SCC (squamous cell carcinoma) 05/11/2019   left scapha   SCC (squamous cell carcinoma) 08/17/2019   mid back   Skin cancer    Sleeping difficulty    Squamous cell carcinoma of skin 07/31/2003   right brow (curetx3, 56fu)    Past Surgical History:  Procedure Laterality Date    CATARACT EXTRACTION W/ INTRAOCULAR LENS  IMPLANT, BILATERAL Bilateral    KNEE ARTHROSCOPY     left   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 He has never used smokeless tobacco. He reports current alcohol  use. He reports that he does not use drugs.  Allergies: Allergies  Allergen Reactions   Antihistamines, Diphenhydramine -Type Swelling and Other (See Comments)    CAUSED PROSTATE TO SWELL & as a child had hyperactivity when took Benadryl  Pt took Pepcid  w/ Benadryl  PO and did fine per Pt & his wife    Family History:  Family History  Problem Relation Age of Onset   Pulmonary fibrosis Mother    Uterine cancer Mother    Prostate cancer Father    Heart disease Brother    Heart attack Other    Aneurysm Other        aortic   Coronary artery disease Sister      Current Outpatient Medications:  amitriptyline  (ELAVIL ) 25 MG tablet, TAKE 1 TABLET(25 MG) BY MOUTH AT BEDTIME, Disp: 90 tablet, Rfl: 0   aspirin  EC 81 MG tablet, Take 81 mg by mouth in the morning. Swallow whole., Disp: , Rfl:    donepezil  (ARICEPT ) 10 MG tablet, Take one tablet at 10 mg daily (Patient taking differently: Take 10 mg by mouth in the morning. Take one tablet at 10 mg daily), Disp: 30 tablet, Rfl: 11   HYDROcodone -acetaminophen  (NORCO) 5-325 MG tablet, Take 1 tablet by mouth 2 (two) times daily as needed for moderate pain (pain score 4-6)., Disp: 60 tablet, Rfl: 0   HYDROcodone -acetaminophen  (NORCO) 5-325 MG tablet, Take 1 tablet by mouth 2 (two) times daily as needed for moderate pain (pain score 4-6)., Disp: 60 tablet, Rfl: 0   HYDROcodone -acetaminophen  (NORCO) 5-325 MG tablet, Take 1 tablet by mouth 2  (two) times daily as needed for moderate pain (pain score 4-6)., Disp: 60 tablet, Rfl: 0   ibuprofen (ADVIL) 200 MG tablet, Take 1,000-1,200 mg by mouth daily as needed (for pain)., Disp: , Rfl:    levothyroxine  (SYNTHROID ) 25 MCG tablet, TAKE 1 TABLET(25 MCG) BY MOUTH DAILY BEFORE BREAKFAST, Disp: 90 tablet, Rfl: 0   LORazepam  (ATIVAN ) 0.5 MG tablet, TAKE 1 TABLET(0.5 MG) BY MOUTH TWICE DAILY AS NEEDED FOR ANXIETY, Disp: 60 tablet, Rfl: 0   metoprolol  tartrate (LOPRESSOR ) 25 MG tablet, Take 0.5 tablets (12.5 mg total) by mouth 2 (two) times daily. Hold dose if blood pressure is less than 90/60 or if heart rate is less than 60, Disp: 60 tablet, Rfl: 3   NON FORMULARY, Apply 1 application  topically See admin instructions. Nervive Pain Relieving Cream- Apply to the right foot every 8 hours as needed for pain, Disp: , Rfl:    Polyethyl Glycol-Propyl Glycol (SYSTANE OP), Place 1 drop into both eyes daily as needed (for dryness)., Disp: , Rfl:    Polyethylene Glycol 3350  (MIRALAX  PO), Take 17 g by mouth daily in the afternoon., Disp: , Rfl:    silodosin (RAPAFLO) 8 MG CAPS capsule, Take 8 mg by mouth daily., Disp: , Rfl:   Review of Systems:  Negative unless indicated in HPI.   Physical Exam: Vitals:   07/19/23 1434  BP: 120/84  Pulse: (!) 101  Temp: (!) 97.5 F (36.4 C)  TempSrc: Oral  SpO2: 96%  Weight: 156 lb 11.2 oz (71.1 kg)    Body mass index is 21.55 kg/m.   Physical Exam Psychiatric:        Behavior: Behavior normal.        Judgment: Judgment normal.      Impression and Plan:  Situational depression  Seasonal depression (HCC)   -Per wife a strong component of seasonal depression has always been present in him over the winter months.  His depressed mood seems to be mainly related to his foot drop and inability to be highly physically active as he used to be previously.  Have given him and his wife information on CBT.  We will follow closely for need for medication but  have elected not to start at this time.  Time spent:32 minutes reviewing chart, interviewing and examining patient and formulating plan of care.     Tully Theophilus Andrews, MD [facility]

## 2023-07-26 ENCOUNTER — Ambulatory Visit: Payer: Medicare Other | Admitting: Physician Assistant

## 2023-07-26 ENCOUNTER — Encounter: Payer: Self-pay | Admitting: Physician Assistant

## 2023-07-26 VITALS — BP 110/76 | HR 70 | Ht 71.5 in | Wt 159.0 lb

## 2023-07-26 DIAGNOSIS — F0280 Dementia in other diseases classified elsewhere without behavioral disturbance: Secondary | ICD-10-CM | POA: Diagnosis not present

## 2023-07-26 DIAGNOSIS — G309 Alzheimer's disease, unspecified: Secondary | ICD-10-CM

## 2023-07-26 NOTE — Progress Notes (Signed)
Assessment/Plan:   Dementia likely due to Alzheimer disease  Perry Nielsen. is a very pleasant 83 y.o. RH male with a history of hypertension, hyperlipidemia, IBS, vitamin B12 deficiency, remote history of lymphoma, insomnia, hypothyroidism seen today in follow up for memory loss. Patient is currently on donepezil 10 mg daily, tolerating well..      Follow up in 6  months. Continue donepezil 10 mg daily, side effects discussed.  BP still on the lower side, and the risk of dizziness if adding memantine is still present.  Will hold on until the BP is better controlled. Recommend good control of her cardiovascular risk factors Continue to control mood as per PCP   Subjective:    This patient is accompanied in the office by his wife who supplements the history.  Previous records as well as any outside records available were reviewed prior to todays visit. Patient was last seen on July 2024 with MMSE 19/30.   Any changes in memory since last visit?  "He has a good and bad days, today is a good day ".  He has difficulty remembering recent conversations and names, including that of his daughter (during Thanksgiving the patient did not recognize his daughter 's name). He is not doing any brain stimulating exercises such as crossword puzzles.  He is not very active. Repeats oneself?  Endorsed Disoriented when walking into a room?  Patient denies Leaving objects?  May misplace things but not in unusual places  Wandering behavior?  denies   Any personality changes since last visit?  denies   Any worsening depression?:  "He has some down moments "-wife says.  He also may experience winter blues.  Has not been on antidepressants due to side effects he does take amitriptyline and lorazepam for sleep which may help somewhat with mood. Hallucinations or paranoia?  Denies.   Seizures? denies    Any sleep changes?  Denies vivid dreams, REM behavior or sleepwalking   Sleep apnea?   Denies.   Any  hygiene concerns? Denies.  Independent of bathing and dressing?  Endorsed  Does the patient needs help with medications?  Wife is in charge Who is in charge of the finances?  Wife is in charge Any changes in appetite?  denies.  Admits to not drinking enough water. Patient have trouble swallowing? Denies.   Does the patient cook? No Any headaches?   denies   Chronic back pain  denies   Ambulates with difficulty?  He has a history of right foot drop since 2016 which may limit his mobility.  He needs a walker to ambulate Recent falls or head injuries? denies     Unilateral weakness, numbness or tingling? denies   Any tremors?  Denies   Any anosmia?  Denies   Any incontinence of urine?  Endorsed, takes Rapaflo, Any bowel dysfunction?  He has a history of IBS with occasional constipation and diarrhea. Patient lives with his wife Does the patient drive?  Yes, denies any issues.   Initial visit 12/31/2021   How long did patient have memory difficulties? 2 years ago he began repeating himself.  Then he began having problems with short-term memory.  He also began forgetting the names of his close friends.  The other day he went to a funeral, and along his way home he has his wife "who died?  ".  Sometimes he asked his wife who lives with them?. Patient lives with: Spouse   repeats oneself? Endorsed, "he is  asking the same questions 1 minute apart "-wife says. Disoriented when walking into a room?  A few months ago, he was concerned because he was not at home (while he was).  He was looking for his house in [place] (he never had a house there).  Leaving objects in unusual places?  Patient denies   Ambulates  with difficulty?  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  Patient denies   Any head injuries?  Patient denies   History of seizures?   Patient denies   Wandering behavior?  Patient denies   Patient drives?  The patient was a  Landscape architect, and continues to drive around town without any issues.  He denies getting lost.  Any mood changes. He is more argumentative than before .  Any history of depression?:  Patient denies   Hallucinations?  Patient denies  Paranoia?  Patient denies   Patient reports that he sleeps well without vivid dreams, REM behavior or sleepwalking    History of sleep apnea?  Patient denies   Any hygiene concerns?  Patient denies   Independent of bathing and dressing?  Endorsed  Does the patient needs help with medications? Wife in charge  Who is in charge of the finances?  Patient is in charge  Any changes in appetite?  Patient denies   Patient have trouble swallowing? Patient denies   Does the patient cook?  Patient denies   Any kitchen accidents such as leaving the stove on? Patient denies   Any headaches?  Patient denies   The double vision? Patient denies   Any focal numbness or tingling?  Patient denies   Chronic back pain Patient denies   Unilateral weakness?  He has a history of chronic right foot drop, but no other areas of numbness or weakness. Any tremors?  R hand minimal tremor  Any history of anosmia?  Patient denies   Any incontinence of urine?  Patient denies   Any bowel dysfunction?   He has a history of IBS, occasionally he has constipation and diarrhea. History of heavy alcohol intake?  Patient denies   History of heavy tobacco use?  Patient denies   Family history of dementia?  Strong family history of Alzheimer's disease in father, sister and brother.     MRI brain personally reviewed was without evidence of acute intracranial abnormality, but mild chronic small vessel ischemic disease has slightly progressed from prior MRI in 2017.  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  PREVIOUS MEDICATIONS:   CURRENT MEDICATIONS:  Outpatient  Encounter Medications as of 07/26/2023  Medication Sig   amitriptyline (ELAVIL) 25 MG tablet TAKE 1 TABLET(25 MG) BY MOUTH AT BEDTIME   aspirin EC 81 MG tablet Take 81 mg by mouth in the morning. Swallow whole.   donepezil (ARICEPT) 10 MG tablet Take one tablet at 10 mg daily (Patient taking differently: Take 10 mg by mouth in the morning. Take one tablet at 10 mg daily)   HYDROcodone-acetaminophen (NORCO) 5-325 MG tablet Take 1 tablet by mouth 2 (two) times daily as needed for moderate pain (pain score 4-6).   HYDROcodone-acetaminophen (NORCO) 5-325 MG tablet Take 1 tablet by mouth 2 (two) times daily as needed for moderate pain (pain score 4-6).   HYDROcodone-acetaminophen (NORCO) 5-325 MG tablet Take 1 tablet by mouth 2 (two) times daily as  needed for moderate pain (pain score 4-6).   ibuprofen (ADVIL) 200 MG tablet Take 1,000-1,200 mg by mouth daily as needed (for pain).   levothyroxine (SYNTHROID) 25 MCG tablet TAKE 1 TABLET(25 MCG) BY MOUTH DAILY BEFORE BREAKFAST   LORazepam (ATIVAN) 0.5 MG tablet TAKE 1 TABLET(0.5 MG) BY MOUTH TWICE DAILY AS NEEDED FOR ANXIETY   metoprolol tartrate (LOPRESSOR) 25 MG tablet Take 0.5 tablets (12.5 mg total) by mouth 2 (two) times daily. Hold dose if blood pressure is less than 90/60 or if heart rate is less than 60   NON FORMULARY Apply 1 application  topically See admin instructions. Nervive Pain Relieving Cream- Apply to the right foot every 8 hours as needed for pain   Polyethyl Glycol-Propyl Glycol (SYSTANE OP) Place 1 drop into both eyes daily as needed (for dryness).   Polyethylene Glycol 3350 (MIRALAX PO) Take 17 g by mouth daily in the afternoon.   silodosin (RAPAFLO) 8 MG CAPS capsule Take 8 mg by mouth daily.   No facility-administered encounter medications on file as of 07/26/2023.       02/04/2023    7:00 AM 08/05/2022    6:00 PM 12/11/2021    3:09 PM  MMSE - Mini Mental State Exam  Orientation to time 0 2 0  Orientation to Place 4 5 5    Registration 3 3 3   Attention/ Calculation 4 4 2   Recall 0 1 0  Language- name 2 objects 2 2 2   Language- repeat 1 1 1   Language- follow 3 step command 3 3 3   Language- read & follow direction 1 1 1   Write a sentence 0 1 1  Copy design 1 1 0  Total score 19 24 18       10/14/2015    1:06 PM  Montreal Cognitive Assessment   Visuospatial/ Executive (0/5) 2  Naming (0/3) 3  Attention: Read list of digits (0/2) 2  Attention: Read list of letters (0/1) 1  Attention: Serial 7 subtraction starting at 100 (0/3) 3  Language: Repeat phrase (0/2) 2  Language : Fluency (0/1) 1  Abstraction (0/2) 2  Delayed Recall (0/5) 0  Orientation (0/6) 5  Total 21  Adjusted Score (based on education) 21    Objective:     PHYSICAL EXAMINATION:    VITALS:   Vitals:   07/26/23 1537  BP: 110/76  Pulse: 70  SpO2: 99%  Weight: 159 lb (72.1 kg)  Height: 5' 11.5" (1.816 m)    GEN:  The patient appears stated age and is in NAD. HEENT:  Normocephalic, atraumatic.   Neurological examination:  General: NAD, well-groomed, appears stated age. Orientation: The patient is alert. Oriented to person, place and not to date Cranial nerves: There is good facial symmetry.The speech is fluent and clear. No aphasia or dysarthria. Fund of knowledge is reduced. Recent and remote memory are impaired. Attention and concentration are reduced.  Able to name objects and repeat phrases.  Hearing is decreased to conversational tone. Sensation: Sensation is intact to light touch throughout Motor: Strength is at least antigravity x4. DTR's 1/4 in UE/LE     Movement examination: Tone: There is normal tone in the UE/LE Abnormal movements:  no tremor.  No myoclonus.  No asterixis.   Coordination:  There is no decremation with RAM's. Normal finger to nose  Gait and Station: The patient has some difficulty arising out of a deep-seated chair without the use of the hands. The patient's stride length is somewhat difficult  due to right foot drop, unable to heel toe.  Uses a walker to ambulate.  Gait is cautious and narrow.    Thank you for allowing [place] the opportunity to participate in the care of this nice patient. Please do not hesitate to contact us for any questions or concerns.   Total time spent on today's visit was 20 minutes dedicated to this patient today, preparing to see patient, examining the patient, ordering tests and/or medications and counseling the patient, documenting clinical information in the EHR or other health record, independently interpreting results and communicating results to the patient/family, discussing treatment and goals, answering patient's questions and coordinating care.  Cc:  Perry Nielsen, Perry Patricia, MD  Perry Nielsen 07/26/2023 5:55 PM

## 2023-07-26 NOTE — Patient Instructions (Addendum)
Continue taking memantine 10 mg daily   Follow up in 6 months

## 2023-08-08 ENCOUNTER — Other Ambulatory Visit: Payer: Self-pay | Admitting: Internal Medicine

## 2023-08-08 DIAGNOSIS — F411 Generalized anxiety disorder: Secondary | ICD-10-CM

## 2023-08-08 DIAGNOSIS — M21371 Foot drop, right foot: Secondary | ICD-10-CM

## 2023-08-30 ENCOUNTER — Other Ambulatory Visit: Payer: Self-pay | Admitting: Internal Medicine

## 2023-08-31 ENCOUNTER — Other Ambulatory Visit: Payer: Self-pay | Admitting: Internal Medicine

## 2023-08-31 DIAGNOSIS — F411 Generalized anxiety disorder: Secondary | ICD-10-CM

## 2023-08-31 DIAGNOSIS — M21371 Foot drop, right foot: Secondary | ICD-10-CM

## 2023-09-13 ENCOUNTER — Other Ambulatory Visit: Payer: Self-pay | Admitting: Internal Medicine

## 2023-09-13 DIAGNOSIS — F411 Generalized anxiety disorder: Secondary | ICD-10-CM

## 2023-09-13 DIAGNOSIS — M21371 Foot drop, right foot: Secondary | ICD-10-CM

## 2023-09-13 NOTE — Telephone Encounter (Signed)
 Copied from CRM [id number]. Topic: Clinical - Medication Refill >> Sep 13, 2023 11:17 AM [name] wrote: Most Recent Primary Care Visit:  Provider: Henderson Cloud  Department: [facility]  Visit Type: ACUTE  Date: 07/19/2023  Medication: LORazepam   Has the patient contacted their pharmacy? Yes (Agent: If no, request that the patient contact the pharmacy for the refill. If patient does not wish to contact the pharmacy document the reason why and proceed with request.) (Agent: If yes, when and what did the pharmacy advise?)  Is this the correct pharmacy for this prescription? Yes If no, delete pharmacy and type the correct one.  This is the patient's preferred pharmacy:   [facility] #[id number] [place], [place] - [address] AT [facility] OF [facility] [address] & [facility] [address] [place] [address] Phone: [phone] Fax: [phone]   Has the prescription been filled recently? Yes  Is the patient out of the medication? Yes  Has the patient been seen for an appointment in the last year OR does the patient have an upcoming appointment? Yes  Can we respond through MyChart? No  Agent: Please be advised that Rx refills may take up to 3 business days. We ask that you follow-up with your pharmacy.

## 2023-09-15 ENCOUNTER — Other Ambulatory Visit: Payer: Self-pay | Admitting: Internal Medicine

## 2023-09-15 ENCOUNTER — Other Ambulatory Visit: Payer: Self-pay | Admitting: Physician Assistant

## 2023-09-15 DIAGNOSIS — E039 Hypothyroidism, unspecified: Secondary | ICD-10-CM

## 2023-09-23 ENCOUNTER — Ambulatory Visit: Admitting: Orthopedic Surgery

## 2023-10-04 ENCOUNTER — Other Ambulatory Visit: Payer: Self-pay | Admitting: Internal Medicine

## 2023-10-04 DIAGNOSIS — C8580 Other specified types of non-Hodgkin lymphoma, unspecified site: Secondary | ICD-10-CM

## 2023-10-04 MED ORDER — HYDROCODONE-ACETAMINOPHEN 5-325 MG PO TABS
1.0000 | ORAL_TABLET | Freq: Two times a day (BID) | ORAL | 0 refills | Status: DC | PRN
Start: 2023-10-04 — End: 2024-03-07

## 2023-10-04 MED ORDER — HYDROCODONE-ACETAMINOPHEN 5-325 MG PO TABS
1.0000 | ORAL_TABLET | Freq: Two times a day (BID) | ORAL | 0 refills | Status: DC | PRN
Start: 2023-10-04 — End: 2024-01-10

## 2023-10-04 MED ORDER — HYDROCODONE-ACETAMINOPHEN 5-325 MG PO TABS
1.0000 | ORAL_TABLET | Freq: Two times a day (BID) | ORAL | 0 refills | Status: DC | PRN
Start: 2023-10-04 — End: 2024-04-04

## 2023-10-04 NOTE — Telephone Encounter (Signed)
 Copied from CRM [id number]. Topic: Clinical - Medication Refill >> Oct 04, 2023  9:41 AM Raven B wrote: Most Recent Primary Care Visit:  Provider: Chaya Jan Y  Department: [facility]  Visit Type: ACUTE  Date: 07/19/2023  Medication: HYDROcodone-acetaminophen (NORCO) 5-325 MG tablet  Has the patient contacted their pharmacy? No (Agent: If no, request that the patient contact the pharmacy for the refill. If patient does not wish to contact the pharmacy document the reason why and proceed with request.) (Agent: If yes, when and what did the pharmacy advise?) has to be renewed   Is this the correct pharmacy for this prescription? Yes If no, delete pharmacy and type the correct one.  This is the patient's preferred pharmacy:  [facility] #[id number] [place], [place] - [address] AT [facility] OF [facility] [address] & [facility] [address] [place] [address] Phone: [phone] Fax: [phone]   Has the prescription been filled recently? No  Is the patient out of the medication? No   Has the patient been seen for an appointment in the last year OR does the patient have an upcoming appointment? Yes  Can we respond through MyChart? No  Agent: Please be advised that Rx refills may take up to 3 business days. We ask that you follow-up with your pharmacy.

## 2023-10-15 ENCOUNTER — Other Ambulatory Visit: Payer: Self-pay | Admitting: Internal Medicine

## 2023-10-15 DIAGNOSIS — M21371 Foot drop, right foot: Secondary | ICD-10-CM

## 2023-10-15 DIAGNOSIS — F411 Generalized anxiety disorder: Secondary | ICD-10-CM

## 2023-10-21 ENCOUNTER — Ambulatory Visit (INDEPENDENT_AMBULATORY_CARE_PROVIDER_SITE_OTHER): Admitting: Orthopedic Surgery

## 2023-10-21 ENCOUNTER — Other Ambulatory Visit (INDEPENDENT_AMBULATORY_CARE_PROVIDER_SITE_OTHER)

## 2023-10-21 DIAGNOSIS — M21371 Foot drop, right foot: Secondary | ICD-10-CM | POA: Diagnosis not present

## 2023-10-21 DIAGNOSIS — M25571 Pain in right ankle and joints of right foot: Secondary | ICD-10-CM

## 2023-10-26 ENCOUNTER — Encounter: Payer: Self-pay | Admitting: Orthopedic Surgery

## 2023-10-26 NOTE — Progress Notes (Signed)
 Office Visit Note   Patient: Perry Nielsen.           Date of Birth: [birth year]           MRN: [id number] Visit Date: 10/21/2023              Requested by: Zilphia Hilt, Charyl Coppersmith, MD [address] [place],  [place] [address] PCP: Zilphia Hilt, Charyl Coppersmith, MD  Chief Complaint  Patient presents with   Right Ankle - Pain      HPI: Patient is an 83 year old gentleman who presents with pain and numbness in the right foot and ankle.  He states he has had foot drop for about 8 years.  Currently uses a walker denies any recent trauma.  Assessment & Plan: Visit Diagnoses:  1. Pain in right ankle and joints of right foot   2. Foot drop, right     Plan: A prescription was provided for a new ankle-foot orthosis.  Recommended stopping Vicodin for his chronic symptoms.  He could try Voltaren gel.  Follow-Up Instructions: Return if symptoms worsen or fail to improve.   Ortho Exam  Patient is alert, oriented, no adenopathy, well-dressed, normal affect, normal respiratory effort. Examination patient has no active EHL function and no E version.  He does have posterior tibial tendon and anterior tibial tendon function.  Patient has a history of non-Hodgkin's lymphoma involving the sciatic nerve.  He currently has a broken posterior AFO.  Patient's foot is plantigrade no equinus contracture.  Imaging: No results found. No images are attached to the encounter.  Labs: Lab Results  Component Value Date   HGBA1C 5.7 09/03/2020   ESRSEDRATE 85 (H) 04/18/2015   CRP 19.6 (H) 04/18/2015   LABURIC 4.3 (L) 07/08/2015   REPTSTATUS 10/14/2022 FINAL 10/09/2022   GRAMSTAIN  04/19/2015    NO WBC SEEN NO ORGANISMS SEEN Performed at [facility]    GRAMSTAIN  04/19/2015    NO WBC SEEN NO ORGANISMS SEEN Performed at [facility]    CULT  10/09/2022    NO GROWTH 5 DAYS Performed at [facility], [address], [place], [place] [address]    LABORGA  ESCHERICHIA COLI (A) 10/09/2022     Lab Results  Component Value Date   ALBUMIN 3.2 (L) 10/13/2022   ALBUMIN 3.1 (L) 10/12/2022   ALBUMIN 3.5 10/11/2022    Lab Results  Component Value Date   MG 1.7 10/13/2022   MG 1.8 10/12/2022   MG 1.9 10/11/2022   Lab Results  Component Value Date   VD25OH 41.92 09/03/2020    No results found for: "PREALBUMIN"    Latest Ref Rng & Units 10/13/2022    5:22 AM 10/12/2022    4:42 AM 10/11/2022    5:33 AM  CBC EXTENDED  WBC 4.0 - 10.5 K/uL 12.7  15.9  25.2   RBC 4.22 - 5.81 MIL/uL 4.05  3.81  4.28   Hemoglobin 13.0 - 17.0 g/dL 82.9  56.2  13.0   HCT 39.0 - 52.0 % 39.7  37.3  42.5   Platelets 150 - 400 K/uL 155  135  143   NEUT# 1.7 - 7.7 K/uL  11.0  21.4   Lymph# 0.7 - 4.0 K/uL  2.1  1.3      There is no height or weight on file to calculate BMI.  Orders:  Orders Placed This Encounter  Procedures   XR Ankle Complete Right   No  orders of the defined types were placed in this encounter.    Procedures: No procedures performed  Clinical Data: No additional findings.  ROS:  All other systems negative, except as noted in the HPI. Review of Systems  Objective: Vital Signs: There were no vitals taken for this visit.  Specialty Comments:  No specialty comments available.  PMFS History: Patient Active Problem List   Diagnosis Date Noted   New onset atrial fibrillation (HCC) 10/11/2022   Chronic pain 10/10/2022   Generalized weakness 10/10/2022   Bandemia 10/10/2022   At risk for polypharmacy 10/10/2022   UTI (urinary tract infection) 10/09/2022   Mild vascular dementia without behavioral disturbance, psychotic disturbance, mood disturbance, or anxiety (HCC) 02/02/2022   Dementia due to Alzheimer's disease (HCC) 01/02/2022   Vitamin B12 deficiency 09/03/2020   Hypothyroidism 09/03/2020   Routine general medical examination at a health care facility 03/23/2017   Need for prophylactic vaccination and inoculation against  influenza 03/23/2017   Lymphoma, 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   Foot drop 02/03/2015   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/03/2015   ED (erectile dysfunction) 08/07/2008   KNEE PAIN, LEFT, CHRONIC 08/07/2008   CAROTID BRUIT, LEFT 08/07/2008   Allergic rhinitis 08/03/2007   DYSFNCT ASSO W/SLEEP STGES/AROUSAL FRM SLEEP 08/03/2007   GERD 02/18/2007   BPH associated with nocturia 02/18/2007   Past Medical History:  Diagnosis Date   ALLERGIC RHINITIS 08/03/2007   BACK PAIN 09/18/2009   Basal cell carcinoma 08/21/2004   right wing nose (MOHS)   BCC (basal cell carcinoma of skin) 06/07/2001   mid upper back   BCC (basal cell carcinoma) 03/08/2013   right forehead (curet & carter)   BPH associated with nocturia 02/18/2007   Qualifier: Diagnosis of  By: Foy Imam CMA, Nettie Barb     CAROTID BRUIT, LEFT 08/07/2008   ERECTILE DYSFUNCTION, MILD 08/07/2008   Foot drop    rt foot   GERD 02/18/2007   HAMMER TOE 08/03/2007   Hepatitis 1977   no residual problems, type A   KNEE PAIN, LEFT, CHRONIC 08/07/2008   Lymphoma (HCC)    right hip   Rash, skin    torso   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   right nose (caulery, 78fu)   SCC (squamous cell carcinoma) 11/24/2016   left upper arm (curetx3, 62fu)   SCC (squamous cell carcinoma) 04/20/2017   mid upper forehead  (tx after bx)   SCC (squamous cell carcinoma) 05/11/2019   left scapha   SCC (squamous cell carcinoma) 08/17/2019   mid back   Skin cancer    Sleeping difficulty    Squamous cell carcinoma of skin 07/31/2003   right brow (curetx3, 67fu)    Family History  Problem Relation Age of Onset   Pulmonary fibrosis Mother    Uterine cancer Mother    Prostate cancer Father    Heart disease Brother    Heart attack Other    Aneurysm Other        aortic   Coronary artery disease Sister     Past Surgical History:  Procedure Laterality Date   CATARACT EXTRACTION W/ INTRAOCULAR LENS  IMPLANT, BILATERAL Bilateral    KNEE ARTHROSCOPY      left   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   Procedure: LAPAROSCOPIC SPLENECTOMY;  Surgeon: Ayesha Lente, MD;  Location: [facility];  Service: General;  Laterality: N/A;   SHOULDER OPEN ROTATOR CUFF REPAIR  07/06/05   right   TONSILLECTOMY AND ADENOIDECTOMY  TRANSURETHRAL RESECTION OF PROSTATE     Social History   Occupational History   Occupation: retired    Comment: Runner, broadcasting/film/video, coaching  Tobacco Use   Smoking status: Never   Smokeless tobacco: Never  Vaping Use   Vaping status: Never Used  Substance and Sexual Activity   Alcohol  use: Yes    Alcohol /week: 0.0 standard drinks of alcohol     Comment: rarely    Drug use: No   Sexual activity: Not on file

## 2023-10-30 ENCOUNTER — Other Ambulatory Visit: Payer: Self-pay | Admitting: Internal Medicine

## 2023-11-18 ENCOUNTER — Ambulatory Visit: Payer: Medicare Other | Admitting: Dermatology

## 2023-11-25 ENCOUNTER — Other Ambulatory Visit: Payer: Self-pay | Admitting: Internal Medicine

## 2023-11-25 DIAGNOSIS — M21371 Foot drop, right foot: Secondary | ICD-10-CM

## 2023-11-25 DIAGNOSIS — F411 Generalized anxiety disorder: Secondary | ICD-10-CM

## 2023-12-01 ENCOUNTER — Encounter: Payer: Self-pay | Admitting: Physician Assistant

## 2023-12-27 ENCOUNTER — Other Ambulatory Visit: Payer: Self-pay | Admitting: Internal Medicine

## 2023-12-27 DIAGNOSIS — M21371 Foot drop, right foot: Secondary | ICD-10-CM

## 2023-12-27 DIAGNOSIS — F411 Generalized anxiety disorder: Secondary | ICD-10-CM

## 2024-01-10 ENCOUNTER — Other Ambulatory Visit: Payer: Self-pay | Admitting: Internal Medicine

## 2024-01-10 DIAGNOSIS — C8580 Other specified types of non-Hodgkin lymphoma, unspecified site: Secondary | ICD-10-CM

## 2024-01-10 NOTE — Telephone Encounter (Unsigned)
 Copied from CRM [id number]. Topic: Clinical - Medication Refill >> Jan 10, 2024  9:51 AM Suzen RAMAN wrote: Medication: HYDROcodone -acetaminophen  (NORCO) 5-325 MG tablet  Has the patient contacted their pharmacy? Yes  This is the patient's preferred pharmacy:  [facility] #[id number] [place], [place] - [address] AT [facility] OF [facility] [address] & [facility] [address] [place] [address] Phone: [phone] Fax: [phone]  Is this the correct pharmacy for this prescription? Yes If no, delete pharmacy and type the correct one.   Has the prescription been filled recently? No  Is the patient out of the medication? No- Last one today  Has the patient been seen for an appointment in the last year OR does the patient have an upcoming appointment? Yes  Can we respond through MyChart? Yes  Agent: Please be advised that Rx refills may take up to 3 business days. We ask that you follow-up with your pharmacy.

## 2024-01-10 NOTE — Telephone Encounter (Signed)
 10/04/23 x3 60 tabs/0 RF

## 2024-01-11 MED ORDER — HYDROCODONE-ACETAMINOPHEN 5-325 MG PO TABS
1.0000 | ORAL_TABLET | Freq: Two times a day (BID) | ORAL | 0 refills | Status: DC | PRN
Start: 2024-01-11 — End: 2024-02-09

## 2024-01-26 ENCOUNTER — Ambulatory Visit: Payer: Medicare Other | Admitting: Physician Assistant

## 2024-02-07 ENCOUNTER — Other Ambulatory Visit: Payer: Self-pay | Admitting: Internal Medicine

## 2024-02-09 ENCOUNTER — Other Ambulatory Visit: Payer: Self-pay | Admitting: Internal Medicine

## 2024-02-09 DIAGNOSIS — C8580 Other specified types of non-Hodgkin lymphoma, unspecified site: Secondary | ICD-10-CM

## 2024-02-09 MED ORDER — HYDROCODONE-ACETAMINOPHEN 5-325 MG PO TABS: 1.0000 | ORAL_TABLET | Freq: Two times a day (BID) | ORAL | 0 refills | Status: DC | PRN

## 2024-02-09 NOTE — Telephone Encounter (Signed)
 Copied from CRM #[id number]. Topic: Clinical - Medication Refill >> Feb 09, 2024 12:45 PM Burnard DEL wrote: Medication: HYDROcodone -acetaminophen  (NORCO) 5-325 MG tablet  Has the patient contacted their pharmacy? No (Agent: If no, request that the patient contact the pharmacy for the refill. If patient does not wish to contact the pharmacy document the reason why and proceed with request.) (Agent: If yes, when and what did the pharmacy advise?)  This is the patient's preferred pharmacy:  [facility] #[id number] [place], [place] - [address] AT [facility] OF [facility] [address] & [facility] [address] [place] [address] Phone: [phone] Fax: [phone]  Is this the correct pharmacy for this prescription? Yes If no, delete pharmacy and type the correct one.   Has the prescription been filled recently? No  Is the patient out of the medication? No  Has the patient been seen for an appointment in the last year OR does the patient have an upcoming appointment? Yes  Can we respond through MyChart? No **Patient wife would like to know if he could have a 3 month supply prescribed* Agent: Please be advised that Rx refills may take up to 3 business days. We ask that you follow-up with your pharmacy.

## 2024-02-15 ENCOUNTER — Telehealth: Payer: Self-pay

## 2024-02-15 NOTE — Telephone Encounter (Signed)
 Copied from CRM #[id number]. Topic: Clinical - Medication Question >> Feb 15, 2024 10:33 AM Maisie BROCKS wrote: Reason for CRM: Sherryle, pharmacist with [facility] called because the patient's medication for lorazepam and hydrocodone can lead to depression & she would like to discuss  alternative treatment plans. Please advise at [phone].

## 2024-02-21 ENCOUNTER — Other Ambulatory Visit: Payer: Self-pay | Admitting: Internal Medicine

## 2024-02-21 DIAGNOSIS — F411 Generalized anxiety disorder: Secondary | ICD-10-CM

## 2024-02-21 DIAGNOSIS — M21371 Foot drop, right foot: Secondary | ICD-10-CM

## 2024-03-07 ENCOUNTER — Other Ambulatory Visit: Payer: Self-pay | Admitting: Internal Medicine

## 2024-03-07 DIAGNOSIS — C8580 Other specified types of non-Hodgkin lymphoma, unspecified site: Secondary | ICD-10-CM

## 2024-03-07 MED ORDER — HYDROCODONE-ACETAMINOPHEN 5-325 MG PO TABS: 1.0000 | ORAL_TABLET | Freq: Two times a day (BID) | ORAL | 0 refills | Status: DC | PRN

## 2024-03-07 NOTE — Telephone Encounter (Signed)
 Copied from CRM #[id number]. Topic: Clinical - Medication Refill >> Mar 07, 2024  1:20 PM Mia F wrote: Medication: HYDROcodone -acetaminophen  (NORCO) 5-325 MG tablet   Has the patient contacted their pharmacy? Yes (Agent: If no, request that the patient contact the pharmacy for the refill. If patient does not wish to contact the pharmacy document the reason why and proceed with request.) (Agent: If yes, when and what did the pharmacy advise?)  This is the patient's preferred pharmacy:  [id number] [place], [place] - [address] AT [facility] OF [facility] [address] & [facility] [address] [place] [address] Phone: [phone] Fax: [phone]  Is this the correct pharmacy for this prescription? Yes If no, delete pharmacy and type the correct one.   Has the prescription been filled recently? Yes  Is the patient out of the medication? No  Has the patient been seen for an appointment in the last year OR does the patient have an upcoming appointment? Yes  Can we respond through MyChart? No  Agent: Please be advised that Rx refills may take up to 3 business days. We ask that you follow-up with your pharmacy.

## 2024-03-09 ENCOUNTER — Other Ambulatory Visit: Payer: Self-pay | Admitting: Internal Medicine

## 2024-03-09 DIAGNOSIS — E039 Hypothyroidism, unspecified: Secondary | ICD-10-CM

## 2024-03-15 ENCOUNTER — Telehealth: Payer: Self-pay | Admitting: *Deleted

## 2024-03-15 DIAGNOSIS — Z23 Encounter for immunization: Secondary | ICD-10-CM

## 2024-03-15 MED ORDER — COVID-19 MRNA VACC (MODERNA) 50 MCG/0.5ML IM SUSP: 0.5000 mL | Freq: Once | INTRAMUSCULAR | 0 refills | Status: AC

## 2024-03-15 NOTE — Telephone Encounter (Signed)
 Rx sent.

## 2024-03-15 NOTE — Telephone Encounter (Signed)
 Copied from CRM [id number]. Topic: Clinical - Medication Question >> Mar 15, 2024  2:46 PM Rea C wrote: Reason for CRM: Patient would like to request a prescription for a covid vaccine.  He's made an appointment with [facility] on 03/23/24.   [id number] [place], [place] - [address] AT [facility] OF [facility] [address] & [facility] [address] [place] [address] Phone: [phone] Fax: [phone] Hours: Not open 24 hours

## 2024-03-21 ENCOUNTER — Telehealth: Payer: Self-pay | Admitting: *Deleted

## 2024-03-21 NOTE — Telephone Encounter (Signed)
 Wife is aware that a prescription is no longer needed.

## 2024-03-21 NOTE — Telephone Encounter (Signed)
 Copied from CRM [id number]. Topic: Clinical - Medication Question >> Mar 15, 2024  2:46 PM Rea C wrote: Reason for CRM: Patient would like to request a prescription for a covid vaccine.  He's made an appointment with [facility] on 03/23/24.   [id number] [place],  - [address] AT [facility] [address] [facility] [address] & [facility] [address] [place] [address] Phone: [phone] Fax: [phone] Hours: Not open 24 hours >> Mar 21, 2024  2:51 PM Chiquita SQUIBB wrote: Patients wife is calling in stating that the pharmacy did not receive the prescription for the covid vaccine and is requesting it be sent over again. Please advise patient of any issue.

## 2024-03-22 ENCOUNTER — Ambulatory Visit: Admitting: Physician Assistant

## 2024-03-29 ENCOUNTER — Telehealth: Payer: Self-pay | Admitting: Neurology

## 2024-03-29 ENCOUNTER — Telehealth: Payer: Self-pay | Admitting: Physician Assistant

## 2024-03-29 NOTE — Telephone Encounter (Signed)
 Pt.s wife is saying he being vocally belligerent (not beliving this is his house or se is his wife and would like a call bk to discuss Rx to help

## 2024-03-29 NOTE — Telephone Encounter (Signed)
 Telephone note made in error, incorrect Doctor

## 2024-03-30 NOTE — Telephone Encounter (Signed)
 I left detailed message to call back to the office at 11:06am 03/30/2024

## 2024-03-30 NOTE — Telephone Encounter (Signed)
 I advised to contact PCP per Camie Sevin, PA-C. Patient is doing well the last two days. Thanked me for calling.

## 2024-04-04 ENCOUNTER — Other Ambulatory Visit: Payer: Self-pay | Admitting: Internal Medicine

## 2024-04-04 DIAGNOSIS — C8580 Other specified types of non-Hodgkin lymphoma, unspecified site: Secondary | ICD-10-CM

## 2024-04-04 NOTE — Telephone Encounter (Unsigned)
 Copied from CRM #[id number]. Topic: Clinical - Medication Refill >> Apr 04, 2024  1:52 PM Macario HERO wrote: Medication: HYDROcodone -acetaminophen  (NORCO) 5-325 MG tablet [[id number]]  Has the patient contacted their pharmacy? No (Agent: If no, request that the patient contact the pharmacy for the refill. If patient does not wish to contact the pharmacy document the reason why and proceed with request.) (Agent: If yes, when and what did the pharmacy advise?)  This is the patient's preferred pharmacy:  [facility] #[id number] [place], [place] - [address] AT [facility] OF [facility] [address] & [facility] [address] [place] [address] Phone: [phone] Fax: [phone]  Is this the correct pharmacy for this prescription? Yes If no, delete pharmacy and type the correct one.   Has the prescription been filled recently? Yes  Is the patient out of the medication? No  Has the patient been seen for an appointment in the last year OR does the patient have an upcoming appointment? Yes  Can we respond through MyChart? No  Agent: Please be advised that Rx refills may take up to 3 business days. We ask that you follow-up with your pharmacy.

## 2024-04-05 MED ORDER — HYDROCODONE-ACETAMINOPHEN 5-325 MG PO TABS: 1.0000 | ORAL_TABLET | Freq: Two times a day (BID) | ORAL | 0 refills | Status: DC | PRN

## 2024-04-24 ENCOUNTER — Other Ambulatory Visit: Payer: Self-pay | Admitting: Internal Medicine

## 2024-04-24 DIAGNOSIS — F411 Generalized anxiety disorder: Secondary | ICD-10-CM

## 2024-04-24 DIAGNOSIS — M21371 Foot drop, right foot: Secondary | ICD-10-CM

## 2024-05-01 ENCOUNTER — Telehealth: Payer: Self-pay | Admitting: Internal Medicine

## 2024-05-01 DIAGNOSIS — C8580 Other specified types of non-Hodgkin lymphoma, unspecified site: Secondary | ICD-10-CM

## 2024-05-01 NOTE — Telephone Encounter (Unsigned)
 Copied from CRM [id number]. Topic: Clinical - Medication Refill >> May 01, 2024  3:43 PM Lauren C wrote: Medication: HYDROcodone -acetaminophen  (NORCO) 5-325 MG tablet  Has the patient contacted their pharmacy? No (Agent: If no, request that the patient contact the pharmacy for the refill. If patient does not wish to contact the pharmacy document the reason why and proceed with request.) (Agent: If yes, when and what did the pharmacy advise?)  This is the patient's preferred pharmacy:  [facility] #[id number] [place], [place] - [address] AT [facility] OF [facility] [address] & [facility] [address] [place] [address] Phone: [phone] Fax: [phone]  Is this the correct pharmacy for this prescription? Yes If no, delete pharmacy and type the correct one.   Has the prescription been filled recently? Yes  Is the patient out of the medication? No  Has the patient been seen for an appointment in the last year OR does the patient have an upcoming appointment? Yes  Can we respond through MyChart? No  Agent: Please be advised that Rx refills may take up to 3 business days. We ask that you follow-up with your pharmacy.

## 2024-05-01 NOTE — Progress Notes (Incomplete)
 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 is a very pleasant 82 y.o. RH male with a history o fhypertension, hyperlipidemia, IBS, vitamin B12 deficiency, remote history of lymphoma, insomnia, hypothyroidism  presenting today in follow-up for evaluation of memory loss. Patient is on donepezil  10 mg daily, tolerating well***.  We discussed adding memantine if BP is better tolerated in view of side effects of dizziness related to the medication.***  Recommendations:   Follow up in   months. Continue donepezil  10 mg daily, side effects discussed ***Start memantine 5 mg twice daily, side effects discussed*** Recommend good control of cardiovascular risk factors Continue to control mood as per PCP ***    Subjective:   This patient is accompanied in the office by his wife***  who supplements the history. Previous records as well as any outside records available were reviewed prior to todays visit.   Patient was last seen on 07/26/2023.***.    Any changes in memory since last visit?   Some good and bad days .  He continues to have difficulties recognizing family members.  He does not do any brain stimulating exercises and he is not very active repeats oneself?  Endorsed Disoriented when walking into a room?  Patient denies ***  Misplacing objects?  He may misplace things but not in unusual places  Wandering behavior?   Denies. Any personality changes since last visit? Denies.   Any worsening depression?:  He does have some moments of feeling down.  He experiences winter blues . Hallucinations or paranoia?  Denies.   Seizures?   Denies.    Any sleep changes? Sleeps well***. Does not sleep very well***.   Denies vivid dreams, REM behavior or sleepwalking   Sleep apnea?   denies ***  Any hygiene concerns?   Denies.   Independent of bathing and dressing?  Endorsed  Does the patient needs help with medications?  Why is in charge *** Who is in charge of  the finances?  Wife is in charge   *** Any changes in appetite?  denies.  Admits not drinking enough water.***   Patient have trouble swallowing?  Denies.   Does the patient cook?  No  Any headaches?    Denies.   Vision changes? Denies. Chronic pain?  Denies.   Ambulates with difficulty?  Has a history of right foot drop since 2016 which may limit his mobility.  Needs a walker to ambulate. ***  Recent falls or head injuries?    Denies.      Unilateral weakness, numbness or tingling?  Denies.   Any tremors?  Denies.   Any anosmia?    Denies.   Any incontinence of urine?  Endorsed. Any bowel dysfunction?  He has a history of IBS with occasional constipation and diarrhea     Patient lives with his wife.*** Does the patient drive?***He continues to drive, denies any issues***  Initial visit 12/31/2021   How long did patient have memory difficulties? 2 years ago he began repeating himself.  Then he began having problems with short-term memory.  He also began forgetting the names of his close friends.  The other day he went to a funeral, and along his way home he has his wife who died?  .  Sometimes he asked his wife who lives with them?. Patient lives with: Spouse   repeats oneself? Endorsed, he is asking the same questions 1 minute apart -wife says. Disoriented when walking into a room?  A few months ago, he was concerned because he was not at home (while he was).  He was looking for his house in [place] (he never had a house there).  Leaving objects in unusual places?  Patient denies   Ambulates  with difficulty?  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  Patient denies   Any head injuries?  Patient denies   History of seizures?   Patient denies   Wandering behavior?  Patient denies   Patient drives?  The patient was a landscape architect, and continues to drive around town without any issues.  He denies getting lost.  Any mood  changes. He is more argumentative than before .  Any history of depression?:  Patient denies   Hallucinations?  Patient denies  Paranoia?  Patient denies   Patient reports that he sleeps well without vivid dreams, REM behavior or sleepwalking    History of sleep apnea?  Patient denies   Any hygiene concerns?  Patient denies   Independent of bathing and dressing?  Endorsed  Does the patient needs help with medications? Wife in charge  Who is in charge of the finances?  Patient is in charge  Any changes in appetite?  Patient denies   Patient have trouble swallowing? Patient denies   Does the patient cook?  Patient denies   Any kitchen accidents such as leaving the stove on? Patient denies   Any headaches?  Patient denies   The double vision? Patient denies   Any focal numbness or tingling?  Patient denies   Chronic back pain Patient denies   Unilateral weakness?  He has a history of chronic right foot drop, but no other areas of numbness or weakness. Any tremors?  R hand minimal tremor  Any history of anosmia?  Patient denies   Any incontinence of urine?  Patient denies   Any bowel dysfunction?   He has a history of IBS, occasionally he has constipation and diarrhea. History of heavy alcohol  intake?  Patient denies   History of heavy tobacco use?  Patient denies   Family history of dementia?  Strong family history of Alzheimer's disease in father, sister and brother.     MRI brain personally reviewed was without evidence of acute intracranial abnormality, but mild chronic small vessel ischemic disease has slightly progressed from prior MRI in 2017.  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    Past Medical History:  Diagnosis Date   ALLERGIC RHINITIS 08/03/2007   BACK PAIN 09/18/2009   Basal cell carcinoma 08/21/2004   right wing nose (MOHS)   BCC (basal  cell carcinoma of skin) 06/07/2001   mid upper back   BCC (basal cell carcinoma) 03/08/2013   right forehead (curet & carter)   BPH associated with nocturia 02/18/2007   Qualifier: Diagnosis of  By: Sherron CMA, Steen     CAROTID BRUIT, LEFT 08/07/2008   ERECTILE DYSFUNCTION, MILD 08/07/2008   Foot drop    rt foot   GERD 02/18/2007   HAMMER TOE 08/03/2007   Hepatitis 1977   no residual problems, type A   KNEE PAIN, LEFT, CHRONIC 08/07/2008   Lymphoma (HCC)    right hip   Rash, skin    torso   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   right nose (caulery, 83fu)   SCC (squamous cell carcinoma)  11/24/2016   left upper arm (curetx3, 35fu)   SCC (squamous cell carcinoma) 04/20/2017   mid upper forehead  (tx after bx)   SCC (squamous cell carcinoma) 05/11/2019   left scapha   SCC (squamous cell carcinoma) 08/17/2019   mid back   Skin cancer    Sleeping difficulty    Squamous cell carcinoma of skin 07/31/2003   right brow (curetx3, 14fu)     Past Surgical History:  Procedure Laterality Date   CATARACT EXTRACTION W/ INTRAOCULAR LENS  IMPLANT, BILATERAL Bilateral    KNEE ARTHROSCOPY     left   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 Swallow whole.   donepezil  (ARICEPT ) 10 MG tablet TAKE 1 TABLET(10 MG) BY MOUTH DAILY   HYDROcodone -acetaminophen  (NORCO) 5-325 MG tablet Take 1 tablet by mouth 2 (two) times daily as needed for moderate pain (pain score 4-6).    HYDROcodone -acetaminophen  (NORCO) 5-325 MG tablet Take 1 tablet by mouth 2 (two) times daily as needed for moderate pain (pain score 4-6).   HYDROcodone -acetaminophen  (NORCO) 5-325 MG tablet Take 1 tablet by mouth 2 (two) times daily as needed for moderate pain (pain score 4-6).   ibuprofen (ADVIL) 200 MG tablet Take 1,000-1,200 mg by mouth daily as needed (for pain).   levothyroxine  (SYNTHROID ) 25 MCG tablet TAKE 1 TABLET(25 MCG) BY MOUTH DAILY BEFORE BREAKFAST   LORazepam  (ATIVAN ) 0.5 MG tablet TAKE 1 TABLET(0.5 MG) BY MOUTH TWICE DAILY AS NEEDED FOR ANXIETY.   metoprolol  tartrate (LOPRESSOR ) 25 MG tablet Take 0.5 tablets (12.5 mg total) by mouth 2 (two) times daily. Hold dose if blood pressure is less than 90/60 or if heart rate is less than 60   NON FORMULARY Apply 1 application  topically See admin instructions. Nervive Pain Relieving Cream- Apply to the right foot every 8 hours as needed for pain   Polyethyl Glycol-Propyl Glycol (SYSTANE OP) Place 1 drop into both eyes daily as needed (for dryness).   Polyethylene Glycol 3350  (MIRALAX  PO) Take 17 g by mouth daily in the afternoon.   silodosin (RAPAFLO) 8 MG CAPS capsule Take 8 mg by mouth daily.   No facility-administered encounter medications on file as of 05/02/2024.     Objective:     PHYSICAL EXAMINATION:    VITALS:  There were no vitals filed for this visit.  GEN:  The patient appears stated age and is in NAD. HEENT:  Normocephalic, atraumatic.        10/14/2015    1:06 PM  Montreal Cognitive Assessment   Visuospatial/ Executive (0/5) 2  Naming (0/3) 3  Attention: Read list of digits (0/2) 2  Attention: Read list of letters (0/1) 1  Attention: Serial 7 subtraction starting at 100 (0/3) 3  Language: Repeat phrase (0/2) 2  Language : Fluency (0/1) 1  Abstraction (0/2) 2  Delayed Recall (0/5) 0  Orientation (0/6) 5  Total 21  Adjusted Score (based on education) 21  02/04/2023    7:00 AM 08/05/2022    6:00 PM  12/11/2021    3:09 PM  MMSE - Mini Mental State Exam  Orientation to time 0 2 0  Orientation to Place 4 5 5   Registration 3 3 3   Attention/ Calculation 4 4 2   Recall 0 1 0  Language- name 2 objects 2 2 2   Language- repeat 1 1 1   Language- follow 3 step command 3 3 3   Language- read & follow direction 1 1 1   Write a sentence 0 1 1  Copy design 1 1 0  Total score 19 24 18       Neurological examination:  Orientation: The patient is alert. Oriented to person, place and date.*** Cranial nerves: There is good facial symmetry.The speech is fluent and clear. No aphasia or dysarthria. Fund of knowledge is reduced. Recent and remote memory impaired.  Attention and concentration are reduced.  Able to name objects and repeat phrases.  Hearing is decreased to conversational tone ***.   Delayed recall *** Sensation: Sensation is intact to light touch throughout Motor: Strength is at least antigravity x4. DTR's 1/4 in UE/LE   Movement examination: Tone: There is normal tone in the UE/LE Abnormal movements:  no tremor.  No myoclonus.  No asterixis.   Coordination:  There is no decremation with RAM's. Normal finger to nose  Gait and Station: The patient has some difficulty arising out of a deep-seated chair without the use of the hands. The patient's stride length is shorter and difficult due to right foot drop.  Gait is cautious and narrow.   Thank you for allowing us  the opportunity to participate in the care of this nice patient. Please do not hesitate to contact us  for any questions or concerns.   Total time spent on today's visit was *** minutes dedicated to this patient today, preparing to see patient, examining the patient, ordering tests and/or medications and counseling the patient, documenting clinical information in the EHR or other health record, independently interpreting results and communicating results to the patient/family, discussing treatment and goals, answering patient's questions  and coordinating care.  Cc:  Theophilus Andrews, Tully GRADE, MD  Camie Sevin 05/01/2024 6:56 AM

## 2024-05-02 ENCOUNTER — Ambulatory Visit: Admitting: Physician Assistant

## 2024-05-02 ENCOUNTER — Encounter: Payer: Self-pay | Admitting: Physician Assistant

## 2024-05-02 MED ORDER — HYDROCODONE-ACETAMINOPHEN 5-325 MG PO TABS: 1.0000 | ORAL_TABLET | Freq: Two times a day (BID) | ORAL | 0 refills | Status: DC | PRN

## 2024-05-10 ENCOUNTER — Encounter: Payer: Self-pay | Admitting: Internal Medicine

## 2024-05-10 ENCOUNTER — Ambulatory Visit (INDEPENDENT_AMBULATORY_CARE_PROVIDER_SITE_OTHER): Admitting: Internal Medicine

## 2024-05-10 VITALS — BP 110/68 | HR 100 | Temp 97.5°F | Wt 157.9 lb

## 2024-05-10 DIAGNOSIS — C8580 Other specified types of non-Hodgkin lymphoma, unspecified site: Secondary | ICD-10-CM | POA: Diagnosis not present

## 2024-05-10 DIAGNOSIS — M21371 Foot drop, right foot: Secondary | ICD-10-CM

## 2024-05-10 MED ORDER — HYDROCODONE-ACETAMINOPHEN 5-325 MG PO TABS: 1.0000 | ORAL_TABLET | Freq: Two times a day (BID) | ORAL | 0 refills | Status: DC | PRN

## 2024-05-10 NOTE — Progress Notes (Signed)
 Established Patient Office Visit     CC/Reason for Visit: Medication refills  HPI: Perry Nielsen. is a 83 y.o. male who is coming in today for the above mentioned reasons. 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  Here today for refills.  Has been feeling well, no major concerns or complaints.   Past Medical/Surgical History: Past Medical History:  Diagnosis Date   ALLERGIC RHINITIS 08/03/2007   BACK PAIN 09/18/2009   Basal cell carcinoma 08/21/2004   right wing nose (MOHS)   BCC (basal cell carcinoma of skin) 06/07/2001   mid upper back   BCC (basal cell carcinoma) 03/08/2013   right forehead (curet & carter)   BPH associated with nocturia 02/18/2007   Qualifier: Diagnosis of  By: Sherron CMA, Steen     CAROTID BRUIT, LEFT 08/07/2008   ERECTILE DYSFUNCTION, MILD 08/07/2008   Foot drop    rt foot   GERD 02/18/2007   HAMMER TOE 08/03/2007   Hepatitis 1977   no residual problems, type A   KNEE PAIN, LEFT, CHRONIC 08/07/2008   Lymphoma (HCC)    right hip   Rash, skin    torso   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   right nose (caulery, 90fu)   SCC (squamous cell carcinoma) 11/24/2016   left upper arm (curetx3, 42fu)   SCC (squamous cell carcinoma) 04/20/2017   mid upper forehead  (tx after bx)   SCC (squamous cell carcinoma) 05/11/2019   left scapha   SCC (squamous cell carcinoma) 08/17/2019   mid back   Skin cancer    Sleeping difficulty    Squamous cell carcinoma of skin 07/31/2003   right brow (curetx3, 14fu)    Past Surgical History:  Procedure Laterality Date   CATARACT EXTRACTION W/ INTRAOCULAR LENS  IMPLANT, BILATERAL Bilateral    KNEE ARTHROSCOPY     left   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 He has never used smokeless tobacco. He reports current alcohol  use. He reports that he does not use drugs.  Allergies: Allergies  Allergen Reactions   Antihistamines, Diphenhydramine -Type Swelling and Other (See Comments)    CAUSED PROSTATE TO SWELL & as a child had hyperactivity when took Benadryl  Pt took Pepcid  w/ Benadryl  PO and did fine per Pt & his wife    Family History:  Family History  Problem Relation Age of Onset   Pulmonary fibrosis Mother    Uterine cancer Mother    Prostate cancer Father    Heart disease Brother    Heart attack Other    Aneurysm Other        aortic   Coronary artery disease Sister      Current Outpatient Medications:    amitriptyline  (ELAVIL ) 25 MG tablet, TAKE 1 TABLET(25 MG) BY MOUTH AT BEDTIME, Disp: 90 tablet, Rfl: 0   aspirin  EC 81 MG tablet, Take 81 mg by mouth in the morning. Swallow whole., Disp: , Rfl:    donepezil  (ARICEPT ) 10 MG tablet, TAKE 1 TABLET(10 MG) BY MOUTH DAILY, Disp: 30 tablet, Rfl: 11   ibuprofen (  ADVIL) 200 MG tablet, Take 1,000-1,200 mg by mouth daily as needed (for pain)., Disp: , Rfl:    levothyroxine  (SYNTHROID ) 25 MCG tablet, TAKE 1 TABLET(25 MCG) BY MOUTH DAILY BEFORE BREAKFAST, Disp: 90 tablet, Rfl: 0   LORazepam  (ATIVAN ) 0.5 MG tablet, TAKE 1 TABLET(0.5 MG) BY MOUTH TWICE DAILY AS NEEDED FOR ANXIETY., Disp: 60 tablet, Rfl: 0   metoprolol  tartrate (LOPRESSOR ) 25 MG tablet, Take 0.5 tablets (12.5 mg total) by mouth 2 (two) times daily. Hold dose if blood pressure is less than 90/60 or if heart rate is less than 60, Disp: 60 tablet, Rfl: 3   NON FORMULARY, Apply 1 application  topically See admin instructions. Nervive Pain Relieving Cream- Apply to the right foot every 8 hours as needed for pain, Disp: , Rfl:    Polyethyl Glycol-Propyl Glycol (SYSTANE OP), Place 1 drop into  both eyes daily as needed (for dryness)., Disp: , Rfl:    Polyethylene Glycol 3350  (MIRALAX  PO), Take 17 g by mouth daily in the afternoon., Disp: , Rfl:    silodosin (RAPAFLO) 8 MG CAPS capsule, Take 8 mg by mouth daily., Disp: , Rfl:    HYDROcodone -acetaminophen  (NORCO) 5-325 MG tablet, Take 1 tablet by mouth 2 (two) times daily as needed for moderate pain (pain score 4-6)., Disp: 60 tablet, Rfl: 0   HYDROcodone -acetaminophen  (NORCO) 5-325 MG tablet, Take 1 tablet by mouth 2 (two) times daily as needed for moderate pain (pain score 4-6)., Disp: 60 tablet, Rfl: 0   HYDROcodone -acetaminophen  (NORCO) 5-325 MG tablet, Take 1 tablet by mouth 2 (two) times daily as needed for moderate pain (pain score 4-6)., Disp: 60 tablet, Rfl: 0  Review of Systems:  Negative unless indicated in HPI.   Physical Exam: Vitals:   05/10/24 1350  BP: 110/68  Pulse: 100  Temp: (!) 97.5 F (36.4 C)  TempSrc: Oral  SpO2: 98%  Weight: 157 lb 14.4 oz (71.6 kg)    Body mass index is 21.72 kg/m.    Impression and Plan:  Right foot drop  Lymphoma, large cell (HCC) -     HYDROcodone -Acetaminophen ; Take 1 tablet by mouth 2 (two) times daily as needed for moderate pain (pain score 4-6).  Dispense: 60 tablet; Refill: 0 -     HYDROcodone -Acetaminophen ; Take 1 tablet by mouth 2 (two) times daily as needed for moderate pain (pain score 4-6).  Dispense: 60 tablet; Refill: 0 -     HYDROcodone -Acetaminophen ; Take 1 tablet by mouth 2 (two) times daily as needed for moderate pain (pain score 4-6).  Dispense: 60 tablet; Refill: 0  -PDMP reviewed, no red flags, overdose rescore is 180.  Refill hydrocodone  for total of 60 tablets a month x 3 months.   Time spent:22 minutes reviewing chart, interviewing and examining patient and formulating plan of care.     Tully Theophilus Andrews, MD [facility]

## 2024-05-15 ENCOUNTER — Encounter: Payer: Self-pay | Admitting: Internal Medicine

## 2024-05-15 ENCOUNTER — Ambulatory Visit: Admitting: Internal Medicine

## 2024-05-15 VITALS — BP 110/68 | HR 100 | Temp 97.9°F | Ht 72.0 in | Wt 158.3 lb

## 2024-05-15 DIAGNOSIS — E039 Hypothyroidism, unspecified: Secondary | ICD-10-CM

## 2024-05-15 DIAGNOSIS — E538 Deficiency of other specified B group vitamins: Secondary | ICD-10-CM | POA: Diagnosis not present

## 2024-05-15 DIAGNOSIS — Z0000 Encounter for general adult medical examination without abnormal findings: Secondary | ICD-10-CM

## 2024-05-15 DIAGNOSIS — R531 Weakness: Secondary | ICD-10-CM

## 2024-05-15 DIAGNOSIS — C8580 Other specified types of non-Hodgkin lymphoma, unspecified site: Secondary | ICD-10-CM

## 2024-05-15 LAB — LIPID PANEL
Cholesterol: 189 mg/dL (ref 0–200)
HDL: 45.2 mg/dL (ref >39.00)
LDL Cholesterol: 115 mg/dL — ABNORMAL HIGH (ref 0–99)
NonHDL: 143.87
Total CHOL/HDL Ratio: 4
Triglycerides: 146 mg/dL (ref 0.0–149.0)
VLDL: 29.2 mg/dL (ref 0.0–40.0)

## 2024-05-15 LAB — CBC WITH DIFFERENTIAL/PLATELET
Basophils Absolute: 0.1 K/uL (ref 0.0–0.1)
Basophils Relative: 0.7 % (ref 0.0–3.0)
Eosinophils Absolute: 0.3 K/uL (ref 0.0–0.7)
Eosinophils Relative: 3.5 % (ref 0.0–5.0)
HCT: 46.8 % (ref 39.0–52.0)
Hemoglobin: 15.6 g/dL (ref 13.0–17.0)
Lymphocytes Relative: 25.9 % (ref 12.0–46.0)
Lymphs Abs: 2.5 K/uL (ref 0.7–4.0)
MCHC: 33.3 g/dL (ref 30.0–36.0)
MCV: 99.1 fl (ref 78.0–100.0)
Monocytes Absolute: 1.7 K/uL — ABNORMAL HIGH (ref 0.1–1.0)
Monocytes Relative: 17.4 % — ABNORMAL HIGH (ref 3.0–12.0)
Neutro Abs: 5.2 K/uL (ref 1.4–7.7)
Neutrophils Relative %: 52.5 % (ref 43.0–77.0)
Platelets: 187 K/uL (ref 150.0–400.0)
RBC: 4.72 Mil/uL (ref 4.22–5.81)
RDW: 13.6 % (ref 11.5–15.5)
WBC: 9.8 K/uL (ref 4.0–10.5)

## 2024-05-15 LAB — VITAMIN D 25 HYDROXY (VIT D DEFICIENCY, FRACTURES): VITD: 23.33 ng/mL — ABNORMAL LOW (ref 30.00–100.00)

## 2024-05-15 LAB — COMPREHENSIVE METABOLIC PANEL WITH GFR
ALT: 24 U/L (ref 0–53)
AST: 21 U/L (ref 0–37)
Albumin: 4.2 g/dL (ref 3.5–5.2)
Alkaline Phosphatase: 71 U/L (ref 39–117)
BUN: 19 mg/dL (ref 6–23)
CO2: 27 meq/L (ref 19–32)
Calcium: 9.4 mg/dL (ref 8.4–10.5)
Chloride: 102 meq/L (ref 96–112)
Creatinine, Ser: 0.99 mg/dL (ref 0.40–1.50)
GFR: 70.81 mL/min (ref >60.00)
Glucose, Bld: 106 mg/dL — ABNORMAL HIGH (ref 70–99)
Potassium: 4.5 meq/L (ref 3.5–5.1)
Sodium: 140 meq/L (ref 135–145)
Total Bilirubin: 0.8 mg/dL (ref 0.2–1.2)
Total Protein: 6.8 g/dL (ref 6.0–8.3)

## 2024-05-15 LAB — VITAMIN B12: Vitamin B-12: 296 pg/mL (ref 211–911)

## 2024-05-15 LAB — TSH: TSH: 3.2 u[IU]/mL (ref 0.35–5.50)

## 2024-05-15 NOTE — Progress Notes (Signed)
 Established Patient Office Visit     CC/Reason for Visit: Annual preventive exam and subsequent Medicare wellness visit  HPI: Perry Nielsen. is a 83 y.o. male who is coming in today for the above mentioned reasons. 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  Feeling well.  Has cognitive impairment.  No concerns or complaints.  Immunizations are up-to-date.  Elects to defer all future cancer screening.   Past Medical/Surgical History: Past Medical History:  Diagnosis Date   ALLERGIC RHINITIS 08/03/2007   BACK PAIN 09/18/2009   Basal cell carcinoma 08/21/2004   right wing nose ([facility])   BCC (basal cell carcinoma of skin) 06/07/2001   mid upper back   BCC (basal cell carcinoma) 03/08/2013   right forehead (curet & carter)   BPH associated with nocturia 02/18/2007   Qualifier: Diagnosis of  By: Sherron CMA, Steen     CAROTID BRUIT, LEFT 08/07/2008   ERECTILE DYSFUNCTION, MILD 08/07/2008   Foot drop    rt foot   GERD 02/18/2007   HAMMER TOE 08/03/2007   Hepatitis 1977   no residual problems, type A   KNEE PAIN, LEFT, CHRONIC 08/07/2008   Lymphoma (HCC)    right hip   Rash, skin    torso   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   right nose (caulery, 66fu)   SCC (squamous cell carcinoma) 11/24/2016   left upper arm (curetx3, 29fu)   SCC (squamous cell carcinoma) 04/20/2017   mid upper forehead  (tx after bx)   SCC (squamous cell carcinoma) 05/11/2019   left scapha   SCC (squamous cell carcinoma) 08/17/2019   mid back   Skin cancer    Sleeping difficulty    Squamous cell carcinoma of skin 07/31/2003   right brow (curetx3, 44fu)    Past Surgical History:  Procedure Laterality Date   CATARACT EXTRACTION W/ INTRAOCULAR LENS  IMPLANT, BILATERAL Bilateral    KNEE ARTHROSCOPY     left   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 He has never used smokeless tobacco. He reports current alcohol  use. He reports that he does not use drugs.  Allergies: Allergies  Allergen Reactions   Antihistamines, Diphenhydramine -Type Swelling and Other (See Comments)    CAUSED PROSTATE TO SWELL & as a child had hyperactivity when took Benadryl  Pt took Pepcid  w/ Benadryl  PO and did fine per Pt & his wife    Family History:  Family History  Problem Relation Age of Onset   Pulmonary fibrosis Mother    Uterine cancer Mother    Prostate cancer Father    Heart disease Brother    Heart attack Other    Aneurysm Other        aortic   Coronary artery disease Sister      Current Outpatient Medications:    amitriptyline  (ELAVIL ) 25 MG tablet, TAKE 1 TABLET(25 MG) BY MOUTH AT BEDTIME, Disp: 90 tablet, Rfl: 0   aspirin  EC 81 MG tablet, Take 81 mg by mouth in the morning. Swallow whole., Disp: , Rfl:    donepezil  (ARICEPT ) 10 MG tablet, TAKE 1 TABLET(10 MG) BY  MOUTH DAILY, Disp: 30 tablet, Rfl: 11   HYDROcodone -acetaminophen  (NORCO) 5-325 MG tablet, Take 1 tablet by mouth 2 (two) times daily as needed for moderate pain (pain score 4-6)., Disp: 60 tablet, Rfl: 0   HYDROcodone -acetaminophen  (NORCO) 5-325 MG tablet, Take 1 tablet by mouth 2 (two) times daily as needed for moderate pain (pain score 4-6)., Disp: 60 tablet, Rfl: 0   HYDROcodone -acetaminophen  (NORCO) 5-325 MG tablet, Take 1 tablet by mouth 2 (two) times daily as needed for moderate pain (pain score 4-6)., Disp: 60 tablet, Rfl: 0   ibuprofen (ADVIL) 200 MG tablet, Take 1,000-1,200 mg by mouth daily as needed (for pain)., Disp: , Rfl:    levothyroxine  (SYNTHROID ) 25 MCG tablet, TAKE 1 TABLET(25 MCG) BY MOUTH DAILY BEFORE BREAKFAST, Disp: 90 tablet, Rfl: 0    LORazepam  (ATIVAN ) 0.5 MG tablet, TAKE 1 TABLET(0.5 MG) BY MOUTH TWICE DAILY AS NEEDED FOR ANXIETY., Disp: 60 tablet, Rfl: 0   metoprolol  tartrate (LOPRESSOR ) 25 MG tablet, Take 0.5 tablets (12.5 mg total) by mouth 2 (two) times daily. Hold dose if blood pressure is less than 90/60 or if heart rate is less than 60, Disp: 60 tablet, Rfl: 3   NON FORMULARY, Apply 1 application  topically See admin instructions. Nervive Pain Relieving Cream- Apply to the right foot every 8 hours as needed for pain, Disp: , Rfl:    Polyethyl Glycol-Propyl Glycol (SYSTANE OP), Place 1 drop into both eyes daily as needed (for dryness)., Disp: , Rfl:    Polyethylene Glycol 3350  (MIRALAX  PO), Take 17 g by mouth daily in the afternoon., Disp: , Rfl:    silodosin (RAPAFLO) 8 MG CAPS capsule, Take 8 mg by mouth daily., Disp: , Rfl:   Review of Systems:  Negative unless indicated in HPI.   Physical Exam: Vitals:   05/15/24 1407  BP: 110/68  Pulse: 100  Temp: 97.9 F (36.6 C)  TempSrc: Oral  SpO2: 95%  Weight: 158 lb 4.8 oz (71.8 kg)  Height: 6' (1.829 m)    Body mass index is 21.47 kg/m.   Physical Exam Vitals reviewed.  Constitutional:      General: He is not in acute distress.    Appearance: Normal appearance. He is not ill-appearing, toxic-appearing or diaphoretic.  HENT:     Head: Normocephalic.     Right Ear: Tympanic membrane, ear canal and external ear normal. There is no impacted cerumen.     Left Ear: Tympanic membrane, ear canal and external ear normal. There is no impacted cerumen.     Nose: Nose normal.     Mouth/Throat:     Mouth: Mucous membranes are moist.     Pharynx: Oropharynx is clear. No oropharyngeal exudate or posterior oropharyngeal erythema.  Eyes:     General: No scleral icterus.       Right eye: No discharge.        Left eye: No discharge.     Conjunctiva/sclera: Conjunctivae normal.  Neck:     Vascular: No carotid bruit.  Cardiovascular:     Rate and Rhythm: Normal rate  and regular rhythm.     Pulses: Normal pulses.     Heart sounds: Murmur heard.  Pulmonary:     Effort: Pulmonary effort is normal. No respiratory distress.     Breath sounds: Normal breath sounds.  Abdominal:     General: Abdomen is flat. Bowel sounds are normal.     Palpations: Abdomen is soft.  Musculoskeletal:  General: Normal range of motion.     Cervical back: Normal range of motion.  Skin:    General: Skin is warm and dry.  Neurological:     General: No focal deficit present.     Mental Status: He is alert and oriented to person, place, and time. Mental status is at baseline.  Psychiatric:        Mood and Affect: Mood normal.        Behavior: Behavior normal.        Thought Content: Thought content normal.        Judgment: Judgment normal.     Subsequent Medicare wellness visit   Visit info / Clinical Intake: Medicare Wellness Visit Type:: Subsequent Annual Wellness Visit Persons participating in visit:: patient & caregiver Medicare Wellness Visit Mode:: In-person (required for WTM) Information given by:: family; patient Interpreter Needed?: No Pre-visit prep was completed: yes AWV questionnaire completed by patient prior to visit?: no Living arrangements:: lives with spouse/significant other Patient's Overall Health Status Rating: (!) fair Typical amount of pain: some Does pain affect daily life?: (!) yes Are you currently prescribed opioids?: (!) yes  Dietary Habits and Nutritional Risks How many meals a day?: 2 Eats fruit and vegetables daily?: (!) no Most meals are obtained by: eating out; preparing own meals In the last 2 weeks, have you had any of the following?: (!) nausea, vomiting, diarrhea Diabetic:: no  Functional Status Activities of Daily Living (to include ambulation/medication): (!) Needs Assist Feeding: Independent Dressing/Grooming: Independent Bathing: Independent Toileting: Independent Transfer: Independent Ambulation: Independent  with device- listed below Home Assistive Devices/Equipment: Walker (specify Type) Medication Administration: Needs assistance (comment) Home Management: Needs assistance (comment) Manage your own finances?: (!) no (wife) Primary transportation is: family/friends Concerns about vision?: no *vision screening is required for WTM* Concerns about hearing?: (!) yes Uses hearing aids?: no Hear whispered voice?: yes  Fall Screening Falls in the past year?: 0 Number of falls in past year: 0 Was there an injury with Fall?: 0 Fall Risk Category Calculator: 0 Patient Fall Risk Level: Low Fall Risk  Fall Risk Fall risk Follow up: Falls evaluation completed  Home and Transportation Safety: All rugs have non-skid backing?: yes All stairs or steps have railings?: yes Grab bars in the bathtub or shower?: yes Have non-skid surface in bathtub or shower?: yes Good home lighting?: yes Regular seat belt use?: yes Hospital stays in the last year:: no  Cognitive Assessment Difficulty concentrating, remembering, or making decisions? : yes Will 6CIT or Mini Cog be Completed: yes What year is it?: 4 points What month is it?: 3 points Give patient an address phrase to remember (5 components): the cow jumped over the moon About what time is it?: 0 points Count backwards from 20 to 1: 0 points Say the months of the year in reverse: 0 points Repeat the address phrase from earlier: 10 points 6 CIT Score: 17 points  Advance Directives (For Healthcare) Does Patient Have a Medical Advance Directive?: Yes Does patient want to make changes to medical advance directive?: No - Patient declined Type of Advance Directive: Healthcare Power of [place]; Living will; Out of facility DNR (pink MOST or yellow form) Copy of Healthcare Power of Attorney in Chart?: No - copy requested Copy of Living Will in Chart?: No - copy requested Out of facility DNR (pink MOST or yellow form) in Chart? (Ambulatory ONLY): No -  copy requested  Reviewed/Updated  Reviewed/Updated: Reviewed All (Medical, Surgical, Family, Medications, Allergies, Care  Teams, Patient Goals); Allergies; Care Teams; Patient Goals    Vision Screening   Right eye Left eye Both eyes  Without correction 20/30 20/30 20/30   With correction         Depression/mood:  Flowsheet Row Clinical Support from 05/15/2024 in [facility] [place]  PHQ-9 Total Score 8        Counseling: Counseling given: Not Answered     Lab orders based on risk factors: Laboratory update will be reviewed     Screening: Patient provided with a written and personalized 5-10 year screening schedule in the AVS. Health Maintenance  Topic Date Due   COVID-19 Vaccine (6 - 2025-26 season) 03/06/2024   Medicare Annual Wellness Visit  05/04/2024   DTaP/Tdap/Td vaccine (4 - Td or Tdap) 03/24/2027   Pneumococcal Vaccine for age over 24  Completed   Flu Shot  Completed   Zoster (Shingles) Vaccine  Completed   Meningitis B Vaccine  Aged Out     Provider List Update: Patient Care Team    Relationship Specialty Notifications Start End  Theophilus Andrews, Tully GRADE, MD PCP - General Internal Medicine  06/24/18   Livingston Rigg, MD Consulting Physician Dermatology  09/03/20   Sheffield, Kelli R, PA-C (Inactive) Physician Assistant Dermatology  08/12/21   Dina Camie BRAVO, PA-C  Neurology  07/26/23        I have personally reviewed and noted the following in the patient's chart:   Medical and social history Use of alcohol , tobacco or illicit drugs  Current medications and supplements Functional ability and status Nutritional status Physical activity Advanced directives List of other physicians Hospitalizations, surgeries, and ER visits in previous 12 months Vitals Screenings to include cognitive, depression, and falls Referrals and appointments  In addition, I have reviewed and discussed with patient certain preventive protocols, quality  metrics, and best practice recommendations. A written personalized care plan for preventive services as well as general preventive health recommendations were provided to patient.  Impression and Plan:  Medicare annual wellness visit, subsequent  Lymphoma, large cell (HCC) -     CBC with Differential/Platelet; Future -     Comprehensive metabolic panel with GFR; Future -     Lipid panel; Future  Hypothyroidism, unspecified type -     TSH; Future  Generalized weakness -     VITAMIN D 25 Hydroxy (Vit-D Deficiency, Fractures); Future  Vitamin B12 deficiency -     Vitamin B12; Future  Acquired hypothyroidism   -Recommend routine eye and dental care. -Healthy lifestyle discussed in detail. -Labs to be updated today. -Prostate cancer screening: No further due to age Health Maintenance  Topic Date Due   COVID-19 Vaccine (6 - 2025-26 season) 03/06/2024   Medicare Annual Wellness Visit  05/04/2024   DTaP/Tdap/Td vaccine (4 - Td or Tdap) 03/24/2027   Pneumococcal Vaccine for age over 58  Completed   Flu Shot  Completed   Zoster (Shingles) Vaccine  Completed   Meningitis B Vaccine  Aged Out    - All immunizations are up-to-date    Perry Nielsen Theophilus Andrews, MD [facility]

## 2024-05-17 ENCOUNTER — Ambulatory Visit: Payer: Self-pay | Admitting: Internal Medicine

## 2024-05-17 DIAGNOSIS — E559 Vitamin D deficiency, unspecified: Secondary | ICD-10-CM

## 2024-05-17 MED ORDER — VITAMIN D (ERGOCALCIFEROL) 1.25 MG (50000 UNIT) PO CAPS: 50000.0000 [IU] | ORAL_CAPSULE | ORAL | 0 refills | Status: AC

## 2024-05-24 ENCOUNTER — Other Ambulatory Visit: Payer: Self-pay | Admitting: Internal Medicine

## 2024-05-24 DIAGNOSIS — M21371 Foot drop, right foot: Secondary | ICD-10-CM

## 2024-05-24 DIAGNOSIS — F411 Generalized anxiety disorder: Secondary | ICD-10-CM

## 2024-06-06 ENCOUNTER — Telehealth: Payer: Self-pay | Admitting: Internal Medicine

## 2024-06-06 DIAGNOSIS — C8580 Other specified types of non-Hodgkin lymphoma, unspecified site: Secondary | ICD-10-CM

## 2024-06-06 NOTE — Telephone Encounter (Unsigned)
 Copied from CRM [id number]. Topic: Clinical - Medication Refill >> Jun 06, 2024  2:10 PM Shardie S wrote: Medication: HYDROcodone -acetaminophen  (NORCO) 5-325 MG tablet  Has the patient contacted their pharmacy? Yes (Agent: If no, request that the patient contact the pharmacy for the refill. If patient does not wish to contact the pharmacy document the reason why and proceed with request.) (Agent: If yes, when and what did the pharmacy advise?)  This is the patient's preferred pharmacy:  [id number] [place], [place] - [address] AT [facility] OF [facility] [address] & [facility] [address] [place] [address] Phone: [phone] Fax: [phone]  Is this the correct pharmacy for this prescription? Yes If no, delete pharmacy and type the correct one.   Has the prescription been filled recently? No  Is the patient out of the medication? Yes  Has the patient been seen for an appointment in the last year OR does the patient have an upcoming appointment? Yes  Can we respond through MyChart? No  Agent: Please be advised that Rx refills may take up to 3 business days. We ask that you follow-up with your pharmacy.

## 2024-06-07 MED ORDER — HYDROCODONE-ACETAMINOPHEN 5-325 MG PO TABS: 1.0000 | ORAL_TABLET | Freq: Two times a day (BID) | ORAL | 0 refills | Status: DC | PRN

## 2024-06-10 ENCOUNTER — Other Ambulatory Visit: Payer: Self-pay | Admitting: Internal Medicine

## 2024-06-10 DIAGNOSIS — E039 Hypothyroidism, unspecified: Secondary | ICD-10-CM

## 2024-07-04 ENCOUNTER — Other Ambulatory Visit: Payer: Self-pay | Admitting: Internal Medicine

## 2024-07-04 DIAGNOSIS — C8580 Other specified types of non-Hodgkin lymphoma, unspecified site: Secondary | ICD-10-CM

## 2024-07-04 NOTE — Telephone Encounter (Unsigned)
 Copied from CRM #[id number]. Topic: Clinical - Medication Refill >> Jul 04, 2024 12:58 PM Mia F wrote: Medication: HYDROcodone -acetaminophen  (NORCO) 5-325 MG tablet   Has the patient contacted their pharmacy? Yes (Agent: If no, request that the patient contact the pharmacy for the refill. If patient does not wish to contact the pharmacy document the reason why and proceed with request.) (Agent: If yes, when and what did the pharmacy advise?)  This is the patient's preferred pharmacy:  [id number] [address] [place], [place] - [address] AT [facility] [address] [facility] [address] & [facility] [address] [place] [address] Phone: [phone] Fax: [phone]  Is this the correct pharmacy for this prescription? Yes If no, delete pharmacy and type the correct one.   Has the prescription been filled recently? Yes  Is the patient out of the medication? No  Has the patient been seen for an appointment in the last year OR does the patient have an upcoming appointment? Yes  Can we respond through MyChart? No  Agent: Please be advised that Rx refills may take up to 3 business days. We ask that you follow-up with your pharmacy.

## 2024-07-05 MED ORDER — HYDROCODONE-ACETAMINOPHEN 5-325 MG PO TABS: 1.0000 | ORAL_TABLET | Freq: Two times a day (BID) | ORAL | 0 refills | Status: DC | PRN

## 2024-07-19 ENCOUNTER — Other Ambulatory Visit: Payer: Self-pay | Admitting: Internal Medicine

## 2024-07-25 ENCOUNTER — Other Ambulatory Visit: Payer: Self-pay | Admitting: Internal Medicine

## 2024-07-25 DIAGNOSIS — M21371 Foot drop, right foot: Secondary | ICD-10-CM

## 2024-07-25 DIAGNOSIS — F411 Generalized anxiety disorder: Secondary | ICD-10-CM

## 2024-08-07 ENCOUNTER — Other Ambulatory Visit: Payer: Self-pay | Admitting: Internal Medicine

## 2024-08-07 DIAGNOSIS — E559 Vitamin D deficiency, unspecified: Secondary | ICD-10-CM

## 2024-08-08 ENCOUNTER — Other Ambulatory Visit: Payer: Self-pay

## 2024-08-08 DIAGNOSIS — C8580 Other specified types of non-Hodgkin lymphoma, unspecified site: Secondary | ICD-10-CM

## 2024-08-08 NOTE — Telephone Encounter (Signed)
 Copied from CRM #[id number]. Topic: Clinical - Medication Refill >> Aug 08, 2024  2:14 PM Burnard DEL wrote: Medication: HYDROcodone -acetaminophen  (NORCO) 5-325 MG tablet  Has the patient contacted their pharmacy? No (Agent: If no, request that the patient contact the pharmacy for the refill. If patient does not wish to contact the pharmacy document the reason why and proceed with request.) (Agent: If yes, when and what did the pharmacy advise?)  This is the patient's preferred pharmacy:  [id number] [place], [place] - [address] AT [facility] [address] [facility] [address] & [facility] [address] [place] [address] Phone: [phone] Fax: [phone]  Is this the correct pharmacy for this prescription? Yes If no, delete pharmacy and type the correct one.   Has the prescription been filled recently? No  Is the patient out of the medication? No(few left)  Has the patient been seen for an appointment in the last year OR does the patient have an upcoming appointment? Yes  Can we respond through MyChart? No  Agent: Please be advised that Rx refills may take up to 3 business days. We ask that you follow-up with your pharmacy.

## 2024-08-08 NOTE — Addendum Note (Signed)
 Addended by: KATHRYNE MILLMAN B on: 08/08/2024 04:09 PM   Modules accepted: Orders

## 2024-08-09 MED ORDER — HYDROCODONE-ACETAMINOPHEN 5-325 MG PO TABS: 1.0000 | ORAL_TABLET | Freq: Two times a day (BID) | ORAL | 0 refills | Status: AC | PRN

## 2024-08-09 NOTE — Telephone Encounter (Signed)
 Refill sent

## 2024-08-10 NOTE — Progress Notes (Signed)
 "   Dementia likely due to Alzheimer's disease   Perry Nielsen. is a very pleasant 84 y.o. RH male with a history of hypertension, hyperlipidemia, IBS, vitamin B12 deficiency, remote history of lymphoma, insomnia, hypothyroidism dementia likely due to Alzheimer's disease seen today in follow up for memory loss. Patient is currently on donepezil  10 mg daily.   Patient was last seen on 07/26/2023. Mild memory decline is noted. MMSE today is  19/30.  He needs some assistance with ADLs due to mobility issues. This patient is accompanied in the office by his wife who supplements the history.  Previous records as well as any outside records available were reviewed prior to todays visit.   Follow up in 6 months Continue donepezil  10 mg daily, side effects discussed Start memantine  5 mg twice daily as directed, if tolerated, we may consider increasing dose to 10 mg bid. side effects discussed  Recommend good control of cardiovascular risk factors.   Continue to control mood as per PCP Recommend taking hearing in an effort to improve comprehension   Discussed the use of AI scribe software for clinical note transcription with the patient, who gave verbal consent to proceed.  History of Present Illness    Cognitive function has remained largely stable over the past year with only minor changes. He continues donepezil  10 mg daily. He experiences occasional difficulty recalling conversations and names, including those of family members, and sometimes misplaces items such as his razor or glasses, requiring assistance from his wife.  He requires reminders and assistance for showering and tends to wear the same clothes for multiple days due to limited mobility. He does not participate in brain-stimulating activities but spends his days reading the newspaper and watching television. He stopped driving two years ago.  He experiences episodes of disorientation, particularly at night, sometimes believing he has  multiple homes or wives and expressing a desire to go home despite being at home. These episodes are associated with irritability and are described as down moments. He denies hallucinations. Mood is stable with persistent low mood described as winter blues.  Mobility is significantly limited by chronic right foot pain, requiring use of a walker. He is not currently undergoing physical therapy. He was previously very active but is now limited by pain. He denies tremor, falls, head injuries, or symptoms suggestive of stroke such as numbness or speech difficulty.  Sleep is adequate with medication, but he has difficulty initiating sleep without it. He rarely experiences dreams or nightmares. His wife manages his medications. Appetite is good, but fluid intake is suboptimal. No dizziness is reported.  He has urinary incontinence managed with Rapaflo and incontinence pads, with ongoing leakage. Constipation is managed with laxatives. He denies diarrhea. No new medical diagnoses have been made in the past year.        Initial visit 12/31/2021   How long did patient have memory difficulties? 2 years ago he began repeating himself.  Then he began having problems with short-term memory.  He also began forgetting the names of his close friends.  The other day he went to a funeral, and along his way home he has his wife who died?  .  Sometimes he asked his wife who lives with them?. Patient lives with: Spouse   repeats oneself? Endorsed, he is asking the same questions 1 minute apart -wife says. Disoriented when walking into a room?  A few months ago, he was concerned because he was not at home (while  he was).  He was looking for his house in [place] (he never had a house there).  Leaving objects in unusual places?  Patient denies   Ambulates  with difficulty?  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  Patient denies   Any head injuries?   Patient denies   History of seizures?   Patient denies   Wandering behavior?  Patient denies   Patient drives?  The patient was a landscape architect, and continues to drive around town without any issues.  He denies getting lost.  Any mood changes. He is more argumentative than before .  Any history of depression?:  Patient denies   Hallucinations?  Patient denies  Paranoia?  Patient denies   Patient reports that he sleeps well without vivid dreams, REM behavior or sleepwalking    History of sleep apnea?  Patient denies   Any hygiene concerns?  Patient denies   Independent of bathing and dressing?  Endorsed  Does the patient needs help with medications? Wife in charge  Who is in charge of the finances?  Patient is in charge  Any changes in appetite?  Patient denies   Patient have trouble swallowing? Patient denies   Does the patient cook?  Patient denies   Any kitchen accidents such as leaving the stove on? Patient denies   Any headaches?  Patient denies   The double vision? Patient denies   Any focal numbness or tingling?  Patient denies   Chronic back pain Patient denies   Unilateral weakness?  He has a history of chronic right foot drop, but no other areas of numbness or weakness. Any tremors?  R hand minimal tremor  Any history of anosmia?  Patient denies   Any incontinence of urine?  Patient denies   Any bowel dysfunction?   He has a history of IBS, occasionally he has constipation and diarrhea. History of heavy alcohol  intake?  Patient denies   History of heavy tobacco use?  Patient denies   Family history of dementia?  Strong family history of Alzheimer's disease in father, sister and brother.     MRI brain personally reviewed was without evidence of acute intracranial abnormality, but mild chronic small vessel ischemic disease has slightly progressed from prior MRI in 2017.  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           08/11/2024    4:00 PM 02/04/2023    7:00 AM 08/05/2022    6:00 PM  MMSE - Mini Mental State Exam  Orientation to time 1 0 2  Orientation to Place 3 4 5   Registration 3 3 3   Attention/ Calculation 4 4 4   Recall 0 0 1  Language- name 2 objects 2 2 2   Language- repeat 1 1 1   Language- follow 3 step command 3 3 3   Language- read & follow direction 1 1 1   Write a sentence 1 0 1  Copy design 0 1 1  Total score 19 19 24       10/14/2015    1:06 PM  Montreal Cognitive Assessment   Visuospatial/ Executive (0/5) 2  Naming (0/3) 3  Attention: Read list of digits (0/2) 2  Attention: Read list of letters (0/1) 1  Attention: Serial 7 subtraction starting at 100 (0/3) 3  Language: Repeat phrase (0/2) 2  Language :  Fluency (0/1) 1  Abstraction (0/2) 2  Delayed Recall (0/5) 0  Orientation (0/6) 5  Total 21  Adjusted Score (based on education) 21      Objective:    Neurological Exam:    VITALS:   Vitals:   08/11/24 1527  BP: 110/77  Pulse: 98  Resp: 20  SpO2: 98%    GEN:  The patient appears stated age and is in NAD. HEENT:  Normocephalic, atraumatic.   Neurological examination:  General: NAD, well-groomed, appears stated age. Orientation: The patient is alert. Oriented to person, not to place or date Cranial nerves: There is good facial symmetry.The speech is fluent and clear. No aphasia or dysarthria. Fund of knowledge is reduced. Recent and remote memory are impaired. Attention and concentration are reduced. Able to name objects and repeat phrases.  Hearing is decreased to conversational tone.   Sensation: Sensation is intact to light touch throughout Motor: Strength is at least antigravity except chronic R foot drop. DTR's 2/4 in UE/LE     Movement examination:  Tone: There is normal tone in the UE/LE Abnormal movements:  no tremor.  No myoclonus.  No asterixis.   Coordination:   There is no decremation with RAM's. Normal finger to nose  Gait and Station: The patient has some difficulty arising out of a deep-seated chair without the use of the hands. The patient's stride length is difficult due to chronic right foot drop, unable to heel toe, uses a walker to ambulate.  Gait is cautious and narrow, slow.    Thank you for allowing us  the opportunity to participate in the care of this nice patient. Please do not hesitate to contact us  for any questions or concerns.   Total time spent on today's visit was 23 minutes dedicated to this patient today, preparing to see patient, examining the patient, ordering tests and/or medications and counseling the patient, documenting clinical information in the EHR or other health record, independently interpreting results and communicating results to the patient/family, discussing treatment and goals, answering patient's questions and coordinating care.  Cc:  Perry Nielsen, Tully GRADE, MD  Camie Sevin 08/11/2024 4:55 PM      "

## 2024-08-11 ENCOUNTER — Ambulatory Visit (INDEPENDENT_AMBULATORY_CARE_PROVIDER_SITE_OTHER): Admitting: Physician Assistant

## 2024-08-11 ENCOUNTER — Encounter: Payer: Self-pay | Admitting: Physician Assistant

## 2024-08-11 VITALS — BP 110/77 | HR 98 | Resp 20

## 2024-08-11 DIAGNOSIS — F0280 Dementia in other diseases classified elsewhere without behavioral disturbance: Secondary | ICD-10-CM

## 2024-08-11 DIAGNOSIS — G309 Alzheimer's disease, unspecified: Secondary | ICD-10-CM

## 2024-08-11 MED ORDER — MEMANTINE HCL 5 MG PO TABS: ORAL_TABLET | ORAL | 11 refills | Status: AC

## 2024-08-11 NOTE — Patient Instructions (Signed)
 It was a pleasure to see you today at our office.   Recommendations:   Continue donepezil  10 mg daily. Side effects were discussed   Start Memantine  5 mg: Take 1 tablet (5 mg at night) for 2 weeks, then increase to 1 tablet (5 mg) twice a day  Follow up in 6 months    [email or web address]/neuro-[email or web address]-apps-[email or web address]-[email or web address]   RECOMMENDATIONS FOR ALL PATIENTS WITH MEMORY PROBLEMS: 1. Continue to exercise (Recommend 30 minutes of walking everyday, or 3 hours every week) 2. Increase social interactions - continue going to [place] and enjoy social gatherings with friends and family 3. Eat healthy, avoid fried foods and eat more fruits and vegetables 4. Maintain adequate blood pressure, blood sugar, and blood cholesterol level. Reducing the risk of stroke and cardiovascular disease also helps promoting better memory. 5. Avoid stressful situations. Live a simple life and avoid aggravations. Organize your time and prepare for the next day in anticipation. 6. Sleep well, avoid any interruptions of sleep and avoid any distractions in the bedroom that may interfere with adequate sleep quality 7. Avoid sugar, avoid sweets as there is a strong link between excessive sugar intake, diabetes, and cognitive impairment We discussed the Mediterranean diet, which has been shown to help patients reduce the risk of progressive memory disorders and reduces cardiovascular risk. This includes eating fish, eat fruits and green leafy vegetables, nuts like almonds and hazelnuts, walnuts, and also use olive oil. Avoid fast foods and fried foods as much as possible. Avoid sweets and sugar as sugar use has been linked to worsening of memory function.  There is always a concern of gradual progression of memory problems. If this is the case, then we may need to adjust level of care according to patient needs. Support, both to the patient and caregiver, should then be put into place.        DRIVING:  Regarding driving, in patients with progressive memory problems, driving will be impaired. We advise to have someone else do the driving if trouble finding directions or if minor accidents are reported. Independent driving assessment is available to determine safety of driving.   If you are interested in the driving assessment, you can contact the following:  [employer] in [place] [phone]  [facility] [phone]  [facility] [phone]  [facility] [phone] or [phone]   FALL PRECAUTIONS: Be cautious when walking. Scan the area for obstacles that may increase the risk of trips and falls. When getting up in the mornings, sit up at the edge of the bed for a few minutes before getting out of bed. Consider elevating the bed at the head end to avoid drop of blood pressure when getting up. Walk always in a well-lit room (use night lights in the walls). Avoid area rugs or power cords from appliances in the middle of the walkways. Use a walker or a cane if necessary and consider physical therapy for balance exercise. Get your eyesight checked regularly.  FINANCIAL OVERSIGHT: Supervision, especially oversight when making financial decisions or transactions is also recommended.  HOME SAFETY: Consider the safety of the kitchen when operating appliances like stoves, microwave oven, and blender. Consider having supervision and share cooking responsibilities until no longer able to participate in those. Accidents with firearms and other hazards in the house should be identified and addressed as well.   ABILITY TO BE LEFT ALONE: If patient is unable to contact 911 operator, consider using LifeLine, or when the need is there, arrange for  someone to stay with patients. Smoking is a fire hazard, consider supervision or cessation. Risk of wandering should be assessed by caregiver and if detected at any point, supervision and safe proof  recommendations should be instituted.  MEDICATION SUPERVISION: Inability to self-administer medication needs to be constantly addressed. Implement a mechanism to ensure safe administration of the medications.      Mediterranean Diet A Mediterranean diet refers to food and lifestyle choices that are based on the traditions of countries located on the [employer]. This way of eating has been shown to help prevent certain conditions and improve outcomes for people who have chronic diseases, like kidney disease and heart disease. What are tips for following this plan? Lifestyle  Cook and eat meals together with your family, when possible. Drink enough fluid to keep your urine clear or pale yellow. Be physically active every day. This includes: Aerobic exercise like running or swimming. Leisure activities like gardening, walking, or housework. Get 7-8 hours of sleep each night. If recommended by your health care provider, drink red wine in moderation. This means 1 glass a day for nonpregnant women and 2 glasses a day for men. A glass of wine equals 5 oz (150 mL). Reading food labels  Check the serving size of packaged foods. For foods such as rice and pasta, the serving size refers to the amount of cooked product, not dry. Check the total fat in packaged foods. Avoid foods that have saturated fat or trans fats. Check the ingredients list for added sugars, such as corn syrup. Shopping  At the grocery store, buy most of your food from the areas near the walls of the store. This includes: Fresh fruits and vegetables (produce). Grains, beans, nuts, and seeds. Some of these may be available in unpackaged forms or large amounts (in bulk). Fresh seafood. Poultry and eggs. Low-fat dairy products. Buy whole ingredients instead of prepackaged foods. Buy fresh fruits and vegetables in-season from local farmers markets. Buy frozen fruits and vegetables in resealable bags. If you do not have  access to quality fresh seafood, buy precooked frozen shrimp or canned fish, such as tuna, salmon, or sardines. Buy small amounts of raw or cooked vegetables, salads, or olives from the deli or salad bar at your store. Stock your pantry so you always have certain foods on hand, such as olive oil, canned tuna, canned tomatoes, rice, pasta, and beans. Cooking  Cook foods with extra-virgin olive oil instead of using butter or other vegetable oils. Have meat as a side dish, and have vegetables or grains as your main dish. This means having meat in small portions or adding small amounts of meat to foods like pasta or stew. Use beans or vegetables instead of meat in common dishes like chili or lasagna. Experiment with different cooking methods. Try roasting or broiling vegetables instead of steaming or sauteing them. Add frozen vegetables to soups, stews, pasta, or rice. Add nuts or seeds for added healthy fat at each meal. You can add these to yogurt, salads, or vegetable dishes. Marinate fish or vegetables using olive oil, lemon juice, garlic, and fresh herbs. Meal planning  Plan to eat 1 vegetarian meal one day each week. Try to work up to 2 vegetarian meals, if possible. Eat seafood 2 or more times a week. Have healthy snacks readily available, such as: Vegetable sticks with hummus. Greek yogurt. Fruit and nut trail mix. Eat balanced meals throughout the week. This includes: Fruit: 2-3 servings a day Vegetables: 4-5 servings  a day Low-fat dairy: 2 servings a day Fish, poultry, or lean meat: 1 serving a day Beans and legumes: 2 or more servings a week Nuts and seeds: 1-2 servings a day Whole grains: 6-8 servings a day Extra-virgin olive oil: 3-4 servings a day Limit red meat and sweets to only a few servings a month What are my food choices? Mediterranean diet Recommended Grains: Whole-grain pasta. Brown rice. Bulgar wheat. Polenta. Couscous. Whole-wheat bread. [place] Madeira. Vegetables: Artichokes. Beets. Broccoli. Cabbage. Carrots. Eggplant. Green beans. Chard. Kale. Spinach. Onions. Leeks. Peas. Squash. Tomatoes. Peppers. Radishes. Fruits: Apples. Apricots. Avocado. Berries. Bananas. Cherries. Dates. Figs. Grapes. Lemons. Melon. Oranges. Peaches. Plums. Pomegranate. Meats and other protein foods: Beans. Almonds. Sunflower seeds. Pine nuts. Peanuts. Cod. Salmon. Scallops. Shrimp. Tuna. Tilapia. Clams. Oysters. Eggs. Dairy: Low-fat milk. Cheese. Greek yogurt. Beverages: Water. Red wine. Herbal tea. Fats and oils: Extra virgin olive oil. Avocado oil. Grape seed oil. Sweets and desserts: Greek yogurt with honey. Baked apples. Poached pears. Trail mix. Seasoning and other foods: Basil. Cilantro. Coriander. Cumin. Mint. Parsley. Sage. Rosemary. Tarragon. Garlic. Oregano. Thyme. Pepper. Balsalmic vinegar. Tahini. Hummus. Tomato sauce. Olives. Mushrooms. Limit these Grains: Prepackaged pasta or rice dishes. Prepackaged cereal with added sugar. Vegetables: Deep fried potatoes (french fries). Fruits: Fruit canned in syrup. Meats and other protein foods: Beef. Pork. Lamb. Poultry with skin. Hot dogs. [place]. Dairy: Ice cream. Sour cream. Whole milk. Beverages: Juice. Sugar-sweetened soft drinks. Beer. Liquor and spirits. Fats and oils: Butter. Canola oil. Vegetable oil. Beef fat (tallow). Lard. Sweets and desserts: Cookies. Cakes. Pies. Candy. Seasoning and other foods: Mayonnaise. Premade sauces and marinades. The items listed may not be a complete list. Talk with your dietitian about what dietary choices are right for you. Summary The Mediterranean diet includes both food and lifestyle choices. Eat a variety of fresh fruits and vegetables, beans, nuts, seeds, and whole grains. Limit the amount of red meat and sweets that you eat. Talk with your health care provider about whether it is safe for you to drink red wine in moderation. This means 1 glass a day for  nonpregnant women and 2 glasses a day for men. A glass of wine equals 5 oz (150 mL). This information is not intended to replace advice given to you by your health care provider. Make sure you discuss any questions you have with your health care provider. Document Released: 02/13/2016 Document Revised: 03/17/2016 Document Reviewed: 02/13/2016 Elsevier Interactive Patient Education  2017 [employer].

## 2025-02-14 ENCOUNTER — Ambulatory Visit: Payer: Self-pay | Admitting: Physician Assistant
